# Patient Record
Sex: Female | Born: 1960 | Race: White | Hispanic: No | Marital: Single | Smoking: Former smoker
Health system: Southern US, Community
[De-identification: ages and names within clinical notes are randomized; demographics above are authoritative.]

## PROBLEM LIST (undated history)

## (undated) DIAGNOSIS — M779 Enthesopathy, unspecified: Secondary | ICD-10-CM

## (undated) DIAGNOSIS — R5383 Other fatigue: Secondary | ICD-10-CM

## (undated) DIAGNOSIS — J90 Pleural effusion, not elsewhere classified: Secondary | ICD-10-CM

## (undated) DIAGNOSIS — J9602 Acute respiratory failure with hypercapnia: Secondary | ICD-10-CM

## (undated) DIAGNOSIS — D126 Benign neoplasm of colon, unspecified: Secondary | ICD-10-CM

## (undated) DIAGNOSIS — R011 Cardiac murmur, unspecified: Secondary | ICD-10-CM

## (undated) DIAGNOSIS — K648 Other hemorrhoids: Secondary | ICD-10-CM

## (undated) DIAGNOSIS — R29898 Other symptoms and signs involving the musculoskeletal system: Secondary | ICD-10-CM

## (undated) DIAGNOSIS — I059 Rheumatic mitral valve disease, unspecified: Secondary | ICD-10-CM

## (undated) DIAGNOSIS — K589 Irritable bowel syndrome without diarrhea: Secondary | ICD-10-CM

## (undated) DIAGNOSIS — M791 Myalgia, unspecified site: Secondary | ICD-10-CM

## (undated) DIAGNOSIS — G44009 Cluster headache syndrome, unspecified, not intractable: Secondary | ICD-10-CM

## (undated) DIAGNOSIS — Z720 Tobacco use: Secondary | ICD-10-CM

## (undated) DIAGNOSIS — G4730 Sleep apnea, unspecified: Secondary | ICD-10-CM

## (undated) DIAGNOSIS — K5790 Diverticulosis of intestine, part unspecified, without perforation or abscess without bleeding: Secondary | ICD-10-CM

## (undated) DIAGNOSIS — Z953 Presence of xenogenic heart valve: Secondary | ICD-10-CM

## (undated) DIAGNOSIS — M1990 Unspecified osteoarthritis, unspecified site: Secondary | ICD-10-CM

## (undated) DIAGNOSIS — I5032 Chronic diastolic (congestive) heart failure: Secondary | ICD-10-CM

## (undated) DIAGNOSIS — M549 Dorsalgia, unspecified: Secondary | ICD-10-CM

## (undated) DIAGNOSIS — I340 Nonrheumatic mitral (valve) insufficiency: Secondary | ICD-10-CM

## (undated) DIAGNOSIS — E039 Hypothyroidism, unspecified: Secondary | ICD-10-CM

## (undated) DIAGNOSIS — J189 Pneumonia, unspecified organism: Secondary | ICD-10-CM

## (undated) DIAGNOSIS — K5900 Constipation, unspecified: Secondary | ICD-10-CM

## (undated) DIAGNOSIS — K219 Gastro-esophageal reflux disease without esophagitis: Secondary | ICD-10-CM

## (undated) DIAGNOSIS — K449 Diaphragmatic hernia without obstruction or gangrene: Secondary | ICD-10-CM

## (undated) DIAGNOSIS — J101 Influenza due to other identified influenza virus with other respiratory manifestations: Secondary | ICD-10-CM

## (undated) DIAGNOSIS — G894 Chronic pain syndrome: Secondary | ICD-10-CM

## (undated) DIAGNOSIS — R0600 Dyspnea, unspecified: Secondary | ICD-10-CM

## (undated) DIAGNOSIS — I5021 Acute systolic (congestive) heart failure: Secondary | ICD-10-CM

## (undated) DIAGNOSIS — I050 Rheumatic mitral stenosis: Secondary | ICD-10-CM

## (undated) DIAGNOSIS — M797 Fibromyalgia: Secondary | ICD-10-CM

## (undated) DIAGNOSIS — E43 Unspecified severe protein-calorie malnutrition: Secondary | ICD-10-CM

## (undated) DIAGNOSIS — Z86010 Personal history of colonic polyps: Secondary | ICD-10-CM

## (undated) DIAGNOSIS — J449 Chronic obstructive pulmonary disease, unspecified: Secondary | ICD-10-CM

## (undated) DIAGNOSIS — R61 Generalized hyperhidrosis: Secondary | ICD-10-CM

## (undated) HISTORY — DX: Myalgia, unspecified site: M79.10

## (undated) HISTORY — PX: CARDIAC CATHETERIZATION: SHX172

## (undated) HISTORY — DX: Other fatigue: R53.83

## (undated) HISTORY — PX: VULVA SURGERY: SHX837

## (undated) HISTORY — DX: Fibromyalgia: M79.7

## (undated) HISTORY — PX: THORACIC OUTLET SURGERY: SHX2502

## (undated) HISTORY — DX: Other hemorrhoids: K64.8

## (undated) HISTORY — PX: NEUROPLASTY / TRANSPOSITION MEDIAN NERVE AT CARPAL TUNNEL BILATERAL: SUR894

## (undated) HISTORY — DX: Gastro-esophageal reflux disease without esophagitis: K21.9

## (undated) HISTORY — DX: Cluster headache syndrome, unspecified, not intractable: G44.009

## (undated) HISTORY — DX: Rheumatic mitral valve disease, unspecified: I05.9

## (undated) HISTORY — PX: TUBAL LIGATION: SHX77

## (undated) HISTORY — PX: CYSTOSTOMY W/ BLADDER DILATION: SHX1432

## (undated) HISTORY — DX: Diaphragmatic hernia without obstruction or gangrene: K44.9

## (undated) HISTORY — DX: Benign neoplasm of colon, unspecified: D12.6

## (undated) HISTORY — DX: Unspecified osteoarthritis, unspecified site: M19.90

## (undated) HISTORY — DX: Enthesopathy, unspecified: M77.9

## (undated) HISTORY — DX: Irritable bowel syndrome, unspecified: K58.9

## (undated) HISTORY — DX: Generalized hyperhidrosis: R61

## (undated) HISTORY — DX: Diverticulosis of intestine, part unspecified, without perforation or abscess without bleeding: K57.90

## (undated) HISTORY — DX: Dorsalgia, unspecified: M54.9

---

## 1998-01-08 ENCOUNTER — Other Ambulatory Visit: Admission: RE | Admit: 1998-01-08 | Discharge: 1998-01-08 | Payer: Self-pay | Admitting: Obstetrics and Gynecology

## 1998-08-22 ENCOUNTER — Ambulatory Visit (HOSPITAL_COMMUNITY): Admission: RE | Admit: 1998-08-22 | Discharge: 1998-08-22 | Payer: Self-pay | Admitting: *Deleted

## 2000-05-14 ENCOUNTER — Other Ambulatory Visit: Admission: RE | Admit: 2000-05-14 | Discharge: 2000-05-14 | Payer: Self-pay | Admitting: *Deleted

## 2000-05-15 ENCOUNTER — Other Ambulatory Visit: Admission: RE | Admit: 2000-05-15 | Discharge: 2000-05-15 | Payer: Self-pay | Admitting: *Deleted

## 2000-06-18 ENCOUNTER — Encounter (INDEPENDENT_AMBULATORY_CARE_PROVIDER_SITE_OTHER): Payer: Self-pay

## 2000-06-18 ENCOUNTER — Other Ambulatory Visit: Admission: RE | Admit: 2000-06-18 | Discharge: 2000-06-18 | Payer: Self-pay | Admitting: *Deleted

## 2000-07-14 ENCOUNTER — Encounter: Admission: RE | Admit: 2000-07-14 | Discharge: 2000-07-14 | Payer: Self-pay | Admitting: *Deleted

## 2000-07-14 ENCOUNTER — Encounter: Payer: Self-pay | Admitting: Obstetrics and Gynecology

## 2000-10-02 ENCOUNTER — Ambulatory Visit (HOSPITAL_COMMUNITY): Admission: RE | Admit: 2000-10-02 | Discharge: 2000-10-02 | Payer: Self-pay | Admitting: Obstetrics and Gynecology

## 2000-10-02 ENCOUNTER — Encounter (INDEPENDENT_AMBULATORY_CARE_PROVIDER_SITE_OTHER): Payer: Self-pay | Admitting: Specialist

## 2002-02-09 ENCOUNTER — Encounter: Payer: Self-pay | Admitting: Emergency Medicine

## 2002-02-09 ENCOUNTER — Emergency Department (HOSPITAL_COMMUNITY): Admission: EM | Admit: 2002-02-09 | Discharge: 2002-02-09 | Payer: Self-pay | Admitting: Emergency Medicine

## 2002-03-01 ENCOUNTER — Other Ambulatory Visit: Admission: RE | Admit: 2002-03-01 | Discharge: 2002-03-01 | Payer: Self-pay | Admitting: Obstetrics and Gynecology

## 2003-08-29 ENCOUNTER — Emergency Department (HOSPITAL_COMMUNITY): Admission: EM | Admit: 2003-08-29 | Discharge: 2003-08-29 | Payer: Self-pay | Admitting: Emergency Medicine

## 2004-12-18 ENCOUNTER — Encounter: Admission: RE | Admit: 2004-12-18 | Discharge: 2004-12-18 | Payer: Self-pay | Admitting: Obstetrics and Gynecology

## 2007-02-10 ENCOUNTER — Ambulatory Visit (HOSPITAL_COMMUNITY): Admission: RE | Admit: 2007-02-10 | Discharge: 2007-02-10 | Payer: Self-pay | Admitting: Obstetrics

## 2007-02-10 ENCOUNTER — Encounter (INDEPENDENT_AMBULATORY_CARE_PROVIDER_SITE_OTHER): Payer: Self-pay | Admitting: Obstetrics

## 2008-10-17 ENCOUNTER — Encounter: Admission: RE | Admit: 2008-10-17 | Discharge: 2008-10-17 | Payer: Self-pay | Admitting: Obstetrics and Gynecology

## 2008-10-20 ENCOUNTER — Encounter: Admission: RE | Admit: 2008-10-20 | Discharge: 2008-10-20 | Payer: Self-pay | Admitting: Obstetrics and Gynecology

## 2010-05-30 ENCOUNTER — Other Ambulatory Visit: Payer: Self-pay | Admitting: Obstetrics & Gynecology

## 2010-07-02 ENCOUNTER — Ambulatory Visit
Admission: RE | Admit: 2010-07-02 | Discharge: 2010-07-02 | Disposition: A | Discharge status: Home / Self Care | Payer: Medicaid Other | Source: Ambulatory Visit | Attending: Family Medicine | Admitting: Family Medicine

## 2010-07-02 ENCOUNTER — Other Ambulatory Visit: Payer: Self-pay | Admitting: Family Medicine

## 2010-07-03 ENCOUNTER — Other Ambulatory Visit: Payer: Self-pay

## 2010-07-05 ENCOUNTER — Ambulatory Visit
Admission: RE | Admit: 2010-07-05 | Discharge: 2010-07-05 | Disposition: A | Discharge status: Home / Self Care | Payer: Medicaid Other | Source: Ambulatory Visit | Attending: Family Medicine | Admitting: Family Medicine

## 2010-09-10 NOTE — Op Note (Signed)
Elaine Le, Elaine Le           ACCOUNT NO.:  [id number]   MEDICAL RECORD NO.:  [id number]          PATIENT TYPE:  AMB   LOCATION:  SDC                           FACILITY:  [facility]   PHYSICIAN:  Sherry A. Dickstein, M.D.DATE OF BIRTH:  1961-01-15   DATE OF PROCEDURE:  02/10/2007  DATE OF DISCHARGE:                               OPERATIVE REPORT   PREOPERATIVE DIAGNOSES:  1. Desire for sterilization.  2. Right ovarian cyst.   POSTOPERATIVE DIAGNOSES:  1. Desire for sterilization.  2. Right ovarian cyst.  3. Resolved ovarian cyst.   PROCEDURE:  Diagnostic laparoscopy, laparoscopic tubal cautery.   SURGEONS:  Sherry A. Rosalio Macadamia, MD, and Alphonsus Sias. Ernestina Penna, MD.   ANESTHESIA:  General.   INDICATIONS:  This is a 50 year old, G2, P1-0-1-1 woman, who has been  seen in the office for irregular vaginal bleeding and intermittent  abdominal pain.  The patient was evaluated for this with an ultrasound.  Ultrasound revealed a right, complex, ovarian cyst, which was consistent  with a hemorrhagic cyst but felt that this needed to be evaluated.  The  patient also had calcifications in her left ovary approximately 2 mm x 3  mm.  The patient also requested a sterilization procedure.  Because of  the combination of these, this request and these findings, the patient  is brought to the operating room for a diagnostic laparoscopy,  laparoscopic tubal cautery, and possible ovarian cystectomy.   FINDINGS:  Normal size anteflexed uterus, normal fallopian tubes and  ovaries, no ovarian cyst seen.   PROCEDURE:  The patient was brought into the operating room.  She was  given adequate general anesthesia.  She was placed in the dorsal  lithotomy position.  Her abdomen and then her vagina were washed with  Betadine.  Pelvic examination was performed and no masses were palpated.  A speculum was placed within the vagina, and the cervix was grasped with  a single tooth Hulka tenaculum.  The speculum was  removed, the surgeon's  gown and gloves were changed, the patient was draped in a sterile  fashion.  A subumbilical area was infiltrated with 0.25% Marcaine,  incision was made and brought down sharply to the fascia.  The fascia  was grasped with Kocher clamps and was incised.  The fascial edges were  identified, and a pursestring stitch was taken with #0 Vicryl.  The  peritoneum was identified and elevated and incised.  An Hassan trocar  was introduced into the peritoneal cavity, carbon dioxide was used to  insufflate the peritoneum, the laparoscope was introduced, and the  pelvic organs were identified.  Pictures were obtained.  The entire  pelvis was inspected.  No endometriosis was seen in any of the areas in  the cul-de-sac anterior and posterior, the posterior ovarian fossae.  The entire upper abdomen was visualized, the appendix was felt to be  normal as well.  The right ovarian cyst that had been seen on ultrasound  was completely resolved.  The left ovary was visualized, there were some  small follicles present, no calcifications were identified, and it was  felt that the left  ovary should be left in place because the  calcifications could not be identified to be removed.  A suprapubic area  was infiltrated and then an incision was made, and under direct  visualization a suprapubic trocar was placed.  The left fallopian tube  was then cauterized in its isthmic ampullary portion over approximately  3 cm leaving at least 3 cm of normal fallopian tube between the  cauterized portion and the cornua.  The same procedure was performed on  the right.  Adequate hemostasis was present.  Prior to the major  inspection and prior to any surgery, the pelvis had been washed with  saline per pelvic washings.  After all the surgery, all carbon dioxide  was allowed to escape, the suprapubic trocar was removed, the Roseanne Reno was  removed, the fascia was closed with the Vicryl pursestring stitch over  a  finger to assure that no bowel was caught in this stitch.  The  subumbilical skin was closed using #4-0 Vicryl in a subcuticular running  stitch.  Both skin incisions were then closed using Dermabond.  The  Hulka tenaculum was removed from the vagina, the cervix was visualized  with no significant bleeding.  The patient was taken out of the dorsal  lithotomy position, she was awakened, she was extubated, she was moved  from the operating table to a stretcher in stable condition.   COMPLICATIONS:  None.   ESTIMATED BLOOD LOSS:  Less than 5 ml.      Sherry A. Rosalio Macadamia, M.D.  Electronically Signed     SAD/MEDQ  D:  02/10/2007  T:  02/10/2007  Job:  [id number]

## 2010-09-13 NOTE — Op Note (Signed)
[facility]  Patient:    Elaine Le, Elaine Le                    MRN: [id number] Proc. Date: 10/02/00 Adm. Date:  [id number] Attending:  Morene Antu                           Operative Report  PREOPERATIVE DIAGNOSES:       1. Irregular bleeding.                               2. Endometrial polyp.  POSTOPERATIVE DIAGNOSIS:      Endometrial polyp.  PROCEDURE:                    Dilation and curettage/hysteroscopy with                               resectoscope.  SURGEON:                      Sherry A. Rosalio Macadamia, M.D.  ANESTHESIA:                   MAC.  ESTIMATED BLOOD LOSS:         Less than 5 cc.  INDICATIONS:                  This is a 50 year old, G1, P1-0-0-1 woman who has had irregular bleeding. She was seen in the office. An ultrasound was performed showing a thickened endometrium with a probable endometrial polyp. Because of that, she is brought to the operating room for D&C/hysteroscopy with a resectoscope.  FINDINGS:                     Normal size anteflexed uterus. No adnexal mass. Endometrial polyp present.  DESCRIPTION OF PROCEDURE:     The patient was brought into the operating room given adequate IV sedation. She was placed in dorsal lithotomy position. Her perineum was washed with Betadine. Pelvic examination was performed. In-and-out catheterization was performed. Surgeons gown and gloves were changed. The patient was draped in sterile fashion. Speculum was placed within the vagina. The vagina was washed with Betadine. Paracervical block was administered with 1% Nesacaine. Anterior lip of the cervix was grasped with a single-tooth tenaculum. Cervix was sounded. Cervix was dilated with Pratt dilators to a #31. The resectoscope was introduced into the endometrial cavity. In placing the resectoscope, polypoid tissue was dislodged and the polyp was removed through the resectoscope. Once this was removed, the endometrial  cavity was able to be visualized. Pictures were obtained. Using a double loop right angle resector, sample tissue was taken circumferentially from the thickened endometrium. Adequate hemostasis was present. All instruments were removed from the vagina. The patient was taken out of the dorsal lithotomy position. She was awakened and she was moved from the operating table to a stretcher in stable condition. Complications were none. Estimated blood loss was less than 5 cc. Sorbitol differential -80 cc. DD:  10/02/00 TD:  10/02/00 Job: [id number]

## 2010-09-13 NOTE — Op Note (Signed)
[facility]  Patient:    Elaine Le, Elaine Le                    MRN: [id number] Proc. Date: 10/02/00 Adm. Date:  [id number] Attending:  Morene Antu                           Operative Report  PREOPERATIVE DIAGNOSIS:       Irregular bleeding and endometrial polyp.  POSTOPERATIVE DIAGNOSIS:      Endometrial polyp.  OPERATION:                    D&C, hysteroscopy with resectoscope.  SURGEON:                      Sherry A. Rosalio Macadamia, M.D.  ANESTHESIA:                   MAC.  INDICATIONS:                  This is a 49 year old gravida 1, para 1-0-0-1 woman, who has had irregular bleeding.  She was seen in the office. An ultrasound was performed showing a thickened endometrium with a probable endometrial polyp.  Because of that, she is brought to the operating room for [facility] hysteroscopy with a resectoscope.  FINDINGS:                     Normal size anteflexed uterus, no adnexal mass. Endometrial polyp present.  DESCRIPTION OF PROCEDURE:     The patient was brought into the operating room and given adequate IV sedation.  She was placed in the dorsolithotomy position.  Her perineum was washed with Betadine.  Pelvic examination was performed.  An in and out catheterization of the bladder was performed.  The surgeons gowns and gloves were changed.  The patient was draped in a sterile fashion.  A speculum was placed within the vagina.  The vagina was washed with Betadine.  A paracervical block was administered with 1% Nesacaine.  The anterior lip of the cervix was grasped with a single tooth tenaculum.  The cervix was sounded.  The cervix was dilated with Pratt dilators to a #31.  The resectoscope was introduced into the endometrial cavity and placed via the resectoscope.  Polypoid tissue was dislodged and the polyp was removed through the resectoscope.  Once this was removed, the endometrial cavity was able to be visualized. Pictures were  obtained using a double loop right angle resector.  A sample of tissue was taken circumferentially from the thickened endometrium.  Adequate hemostasis was present.  All instruments were removed from the vagina.  The patient was taken out of the dorsolithotomy position.  She was awakened. She was moved from the operating table to a stretcher in stable condition. Complications were none.  Estimated blood loss was less than 5 cc.  Sorbitol differential -80 cc. DD:  10/02/00 TD:  10/03/00 Job: [id number]

## 2010-12-31 ENCOUNTER — Other Ambulatory Visit: Payer: Self-pay | Admitting: Family Medicine

## 2010-12-31 ENCOUNTER — Encounter: Payer: Self-pay | Admitting: Internal Medicine

## 2011-01-08 ENCOUNTER — Other Ambulatory Visit: Payer: Self-pay | Admitting: Family Medicine

## 2011-01-21 ENCOUNTER — Other Ambulatory Visit (INDEPENDENT_AMBULATORY_CARE_PROVIDER_SITE_OTHER): Payer: Medicare Other

## 2011-01-21 ENCOUNTER — Encounter: Payer: Self-pay | Admitting: Internal Medicine

## 2011-01-21 ENCOUNTER — Ambulatory Visit (INDEPENDENT_AMBULATORY_CARE_PROVIDER_SITE_OTHER): Payer: Medicare Other | Admitting: Internal Medicine

## 2011-01-21 DIAGNOSIS — R6889 Other general symptoms and signs: Secondary | ICD-10-CM

## 2011-01-21 DIAGNOSIS — R1013 Epigastric pain: Secondary | ICD-10-CM

## 2011-01-21 DIAGNOSIS — M797 Fibromyalgia: Secondary | ICD-10-CM | POA: Insufficient documentation

## 2011-01-21 DIAGNOSIS — K589 Irritable bowel syndrome without diarrhea: Secondary | ICD-10-CM | POA: Insufficient documentation

## 2011-01-21 DIAGNOSIS — Z1211 Encounter for screening for malignant neoplasm of colon: Secondary | ICD-10-CM

## 2011-01-21 DIAGNOSIS — K219 Gastro-esophageal reflux disease without esophagitis: Secondary | ICD-10-CM

## 2011-01-21 HISTORY — DX: Irritable bowel syndrome without diarrhea: K58.9

## 2011-01-21 LAB — COMPREHENSIVE METABOLIC PANEL
AST: 26 U/L (ref 0–37)
Alkaline Phosphatase: 94 U/L (ref 39–117)
BUN: 10 mg/dL (ref 6–23)
Creatinine, Ser: 0.6 mg/dL (ref 0.4–1.2)
Glucose, Bld: 105 mg/dL — ABNORMAL HIGH (ref 70–99)
Potassium: 4.8 mEq/L (ref 3.5–5.1)
Total Bilirubin: 0.5 mg/dL (ref 0.3–1.2)

## 2011-01-21 LAB — CBC WITH DIFFERENTIAL/PLATELET
Basophils Relative: 0.7 % (ref 0.0–3.0)
Eosinophils Absolute: 0.1 10*3/uL (ref 0.0–0.7)
HCT: 44.7 % (ref 36.0–46.0)
Lymphs Abs: 1.7 10*3/uL (ref 0.7–4.0)
MCHC: 33.9 g/dL (ref 30.0–36.0)
MCV: 93 fl (ref 78.0–100.0)
Monocytes Absolute: 0.5 10*3/uL (ref 0.1–1.0)
Neutrophils Relative %: 74.1 % (ref 43.0–77.0)
RBC: 4.81 Mil/uL (ref 3.87–5.11)

## 2011-01-21 LAB — TSH: TSH: 3.75 u[IU]/mL (ref 0.35–5.50)

## 2011-01-21 MED ORDER — ALIGN PO CAPS: 1.0000 | ORAL_CAPSULE | Freq: Every day | ORAL | Status: DC

## 2011-01-21 MED ORDER — PEG-KCL-NACL-NASULF-NA ASC-C 100 G PO SOLR: 1.0000 | Freq: Once | ORAL | Status: DC

## 2011-01-21 MED ORDER — PANTOPRAZOLE SODIUM 40 MG PO TBEC: 40.0000 mg | DELAYED_RELEASE_TABLET | Freq: Every day | ORAL | Status: DC

## 2011-01-21 NOTE — Progress Notes (Signed)
Subjective:    Patient ID: Elaine Le, female    DOB: 05-Nov-1960, 50 y.o.   MRN: [id number]  HPI Elaine Le is a 50 year old female with a past medical history of IBS, fibromyalgia, GERD, arthritis seen in consultation at the request of Dr. Docia Chuck for evaluation of reflux and abdominal pain with bloating.  The patient reports she's had "stomach problems" since childhood. She reports this has been worse over the last 10 years. She reports one of her issues is acid reflux. She notes this to be worse at night.  Her main symptoms are heartburn with water brash. She occasionally regurgitates food. She denies dysphagia or odynophagia.  She has taken Nexium in the past but has been off this for the last 1-2 months. She reports Nexium helps her but not completely. She has not taken it because she wanted a GI opinion. She also reports burning epigastric pain which is intermittent, and often briefly better with eating. She reports feeling "hungry, but not hungry". She reports her appetite overall is good, she tries to eat twice daily. She reports her weight is stable to slightly increased. She does note some nausea without vomiting. Regarding her bowel movements, she reports that she alternates between diarrhea and constipation. She has used MiraLAX 17 g daily. When she is on daily MiraLAX she reports her stools are too loose, and like diarrhea. When she is off MiraLAX she reports constipation. She does occasionally use when necessary lactulose for severe constipation. She denies red blood in her stools but reports occasional dark stool. No definite melena.  She notes significant gas with bloating and belching. This is a long-standing symptom. She also will have lower abdominal cramping prior bowel movement. She denies fevers chills or night sweats. She does rarely use Vicodin for fibromyalgia. She reports her sleep is poor due to reflux and hot flashes.  She also remembers an upper endoscopy and  colonoscopy versus flexible sigmoidoscopy greater than 10 years ago. She also endorses a history of H. pylori, unsure how this was diagnosed. She does remember completing antibiotic therapy. This was also greater than 10 years ago   Review of Systems Constitutional: Negative for fever, chills, night sweats, activity change, appetite change and unexpected weight change HEENT: Negative for sore throat, mouth sores and trouble swallowing.  +Allergy/sinus trouble. Eyes: Negative for visual disturbance Respiratory: Negative for cough, chest tightness and shortness of breath Cardiovascular: Negative for chest pain, palpitations and lower extremity swelling Gastrointestinal: See history of present illness Genitourinary: Negative for dysuria and hematuria. Positive for chronic urinary leakage Musculoskeletal: No arthralgias or myalgias, positive chronic back pain Skin: Negative for rash or color change Neurological: Negative for weakness, numbness, positive for intermittent headaches Hematological: Negative for adenopathy, negative for easy bruising/bleeding Psychiatric/behavioral: Negative for depressed mood, negative for anxiety   Past Medical History  Diagnosis Date  . Chronic sinusitis   . Arthritis   . Fibromyalgia   . GERD (gastroesophageal reflux disease)   . Irritable bowel syndrome (IBS)   . Headaches, cluster   . Back pain   . Fatigue   . Muscle pain   . Night sweats   . Continuous urine leakage    Current Outpatient Prescriptions  Medication Sig Dispense Refill  . Black Cohosh 540 MG CAPS Take by mouth. Prn       . esomeprazole (NEXIUM) 40 MG capsule Take 40 mg by mouth daily before breakfast.        . HYDROcodone-acetaminophen (VICODIN) 5-500 MG per  tablet Take 1 tablet by mouth every 6 (six) hours as needed.        Marland Kitchen ibuprofen (ADVIL,MOTRIN) 200 MG tablet Take 200 mg by mouth every 6 (six) hours as needed.        Boris Lown Oil 500 MG CAPS Take by mouth. As directed           . lactulose (CHRONULAC) 10 GM/15ML solution Take 20 g by mouth daily as needed.        . magnesium gluconate (MAGONATE) 500 MG tablet Take 1,000 mg by mouth daily.        . polyethylene glycol (MIRALAX / GLYCOLAX) packet Take 17 g by mouth daily.        . pseudoephedrine (SUDAFED) 30 MG tablet Take 30 mg by mouth every 4 (four) hours as needed.        . psyllium (REGULOID) 0.52 G capsule Take by mouth daily. 5 capsules as needed       . sodium chloride (OCEAN) 0.65 % nasal spray Place 2 sprays into the nose as needed.        . bifidobacterium infantis (ALIGN) capsule Take 1 capsule by mouth daily.  30 capsule  0  . pantoprazole (PROTONIX) 40 MG tablet Take 1 tablet (40 mg total) by mouth daily.  30 tablet  11  . peg 3350 powder (MOVIPREP) 100 G SOLR Take 1 kit (100 g total) by mouth once.  1 kit  0   Allergies  Allergen Reactions  . Morphine And Related     Altered mental state   Family History  Problem Relation Age of Onset  . Kidney cancer Mother   . Colon polyps Mother   . Diabetes Sister     brother  . Liver disease Sister -- fatty liver   . Irritable bowel syndrome Mother     daughter   Social History  . Marital Status: Single    Number of Children: 1   Social History Main Topics  . Smoking status: Current Everyday Smoker -- 0.5 packs/day    Types: Cigarettes  . Smokeless tobacco: Never Used  . Alcohol Use: No  . Drug Use: No      Objective:   Physical Exam BP 102/70  Pulse 78  Ht 5' (1.524 m)  Wt 109 lb (49.442 kg)  BMI 21.29 kg/m2  SpO2 96% Constitutional: Well-developed and well-nourished. No distress. HEENT: Normocephalic and atraumatic. Oropharynx is clear and moist. No oropharyngeal exudate. Conjunctivae are normal. Pupils are equal round and reactive to light. No scleral icterus. Neck: Neck supple. Trachea midline. Cardiovascular: Normal rate, regular rhythm and intact distal pulses. No M/R/G Pulmonary/chest: Effort normal and breath sounds normal. No  wheezing, rales or rhonchi. Abdominal: Soft, nontender, mildly distended. Bowel sounds active throughout. There are no masses palpable. No hepatosplenomegaly. Lymphadenopathy: No cervical adenopathy noted. Neurological: Alert and oriented to person place and time. Skin: Skin is warm and dry. No rashes noted. Psychiatric: Normal mood and affect. Behavior is normal.  Abdominal ultrasound March 2012: COMPLETE ABDOMINAL ULTRASOUND  Comparison: None.  Findings:  Gallbladder: No gallstones, gallbladder wall thickening, or  pericholecystic fluid. Evaluation for sonographic Murphy's sign was  negative  Common bile duct: Has a maximal caliber 3.4 mm and a normal  appearance  Liver: No focal lesion identified. Within normal limits in  parenchymal echogenicity.  IVC: Appears normal.  Pancreas: No focal abnormality seen.  Spleen: Has a sagittal length of 2.9 cm. No focal parenchymal  abnormalities are  seen  Right Kidney: Has a sagittal length of 10.1 cm. No focal  parenchymal abnormality or signs of hydronephrosis are evident  Left Kidney: Demonstrates a sagittal length of 10.9 cm. No focal  parenchymal abnormality or signs of hydronephrosis are evident.  Abdominal aorta: Has a maximal caliber of 1.8 cm with no  aneurysmal dilatation.   IMPRESSION:  Negative abdominal ultrasound.     Assessment & Plan:  50 year old female with a past medical history of IBS, fibromyalgia, GERD, arthritis seen in consultation at the request of Dr. Docia Chuck for evaluation of reflux and abdominal pain with bloating  1.  IBS -- the vast majority of the patient's symptoms are very consistent with irritable bowel syndrome. Majority of her symptoms are long-standing and there is no specific alarm symptom at present.  We have extensively discussed irritable bowel today and how it is chronic in nature and seems to flare based on changing conditions in her life, such as stress. I would like to perform several lab tests  today including CBC, chemistry panel, TSH and celiac panel. We have discussed gas and bloating I have recommended that she avoid specific foods like for citrus vegetables, legumes, and carbonated beverages. We will give her a low gas/low bloating diet. I also would like for her to take MiraLAX 3 days per week in an attempt to better regulate her bowel habits and avoid constipation. I will start her on Align one tablet daily. She was given a 14 day sample with some coupons for this today. Hopefully this will help regulate her digestive system and improve her regularity.  We also briefly discussed on narcotic pain medication can affect bowel transit and in some cases worsen abdominal pain. At present she is rarely using this medication and for now I doesn't seem to be a major contributing factor to her symptoms. Finally she is due colonoscopy for screening and we'll schedule this today.  2. GERD -- she does have symptoms consistent with reflux disease. This is a daily problem at present. She is unsure of Nexium completely helps her, therefore we'll try another PPI. I've given her prescription for pantoprazole 40 mg daily. We did discuss how best to take this medication, specifically 30 minutes to one hour before her first meal of the day. Also given her epigastric burning and the long-standing nature of her heartburn, we will proceed with upper endoscopy. This will be done on the same day as her colonoscopy.  All procedures will be done with propofol given her intermittent narcotic pain medication use.  Return to clinic one month after procedures.

## 2011-01-21 NOTE — Patient Instructions (Addendum)
Go directly to the basement to have your labs drawn today.  You have been given a separate informational sheet regarding your tobacco use, the importance of quitting and local resources to help you quit. Discontinue Nexium and start Protonix one tablet by mouth once daily. A prescription has been sent to the pharmacy.  Low gas diet given. Use Miralax over the counter 17 grams in 8 oz of water on Monday, Wednesday, and Friday.  Start Align one tablet by mouth once daily and samples given to get you started.  You have been scheduled for a Endoscopy/ Colonoscopy with propofol. See separate instructions. Follow with Dr. Rhea Belton one month after your procedure.  cc: [place] Docia Chuck, MD

## 2011-01-22 LAB — CELIAC PANEL 10
Endomysial Screen: NEGATIVE
Gliadin IgA: 3 U/mL (ref <20)
Gliadin IgG: 3.3 U/mL (ref <20)
IgA: 132 mg/dL (ref 69–380)
Tissue Transglutaminase Ab, IgA: 3.5 U/mL (ref <20)

## 2011-01-31 ENCOUNTER — Other Ambulatory Visit: Payer: Self-pay | Admitting: Family Medicine

## 2011-01-31 ENCOUNTER — Ambulatory Visit
Admission: RE | Admit: 2011-01-31 | Discharge: 2011-01-31 | Disposition: A | Discharge status: Home / Self Care | Payer: Medicare Other | Source: Ambulatory Visit | Attending: Family Medicine | Admitting: Family Medicine

## 2011-01-31 DIAGNOSIS — M25562 Pain in left knee: Secondary | ICD-10-CM

## 2011-02-04 ENCOUNTER — Other Ambulatory Visit: Payer: Self-pay | Admitting: Family Medicine

## 2011-02-04 DIAGNOSIS — R9089 Other abnormal findings on diagnostic imaging of central nervous system: Secondary | ICD-10-CM

## 2011-02-05 LAB — CBC
MCHC: 34.4
MCV: 92.4
RDW: 13.8

## 2011-02-05 LAB — URINALYSIS, ROUTINE W REFLEX MICROSCOPIC
Ketones, ur: NEGATIVE
Protein, ur: NEGATIVE
Urobilinogen, UA: 0.2

## 2011-02-05 LAB — PREGNANCY, URINE: Preg Test, Ur: NEGATIVE

## 2011-02-10 ENCOUNTER — Ambulatory Visit
Admission: RE | Admit: 2011-02-10 | Discharge: 2011-02-10 | Disposition: A | Discharge status: Home / Self Care | Payer: Medicare Other | Source: Ambulatory Visit | Attending: Family Medicine | Admitting: Family Medicine

## 2011-02-10 DIAGNOSIS — R9089 Other abnormal findings on diagnostic imaging of central nervous system: Secondary | ICD-10-CM

## 2011-02-13 ENCOUNTER — Encounter: Payer: Self-pay | Admitting: Internal Medicine

## 2011-02-13 ENCOUNTER — Ambulatory Visit (AMBULATORY_SURGERY_CENTER): Payer: Medicare Other | Admitting: Internal Medicine

## 2011-02-13 DIAGNOSIS — Z1211 Encounter for screening for malignant neoplasm of colon: Secondary | ICD-10-CM

## 2011-02-13 DIAGNOSIS — K2970 Gastritis, unspecified, without bleeding: Secondary | ICD-10-CM

## 2011-02-13 DIAGNOSIS — K2940 Chronic atrophic gastritis without bleeding: Secondary | ICD-10-CM

## 2011-02-13 DIAGNOSIS — R1013 Epigastric pain: Secondary | ICD-10-CM

## 2011-02-13 DIAGNOSIS — D126 Benign neoplasm of colon, unspecified: Secondary | ICD-10-CM

## 2011-02-13 DIAGNOSIS — K219 Gastro-esophageal reflux disease without esophagitis: Secondary | ICD-10-CM

## 2011-02-13 MED ORDER — SODIUM CHLORIDE 0.9 % IV SOLN: 500.0000 mL | INTRAVENOUS | Status: DC

## 2011-02-13 NOTE — Patient Instructions (Signed)
FOLLOW DISCHARGE INSTRUCTIONS (BLUE & GREEN SHEETS).   INFORMATION ON POLYPS, DIVERTICULOSIS, HIGH FIBER DIET , HIATAL HERNIA, & GASTRITIS GIVEN TO YOU  PLEASE MAKE APPOINTMENT TO FOLLOW UP WITH DR PYRTLE IN 1-2 MONTHS.

## 2011-02-14 ENCOUNTER — Telehealth: Payer: Self-pay | Admitting: *Deleted

## 2011-02-14 ENCOUNTER — Telehealth: Payer: Self-pay

## 2011-02-14 NOTE — Telephone Encounter (Signed)
No ID on answering machine. 

## 2011-02-14 NOTE — Telephone Encounter (Signed)
Patient called around 8:30am to ask if she could take advil.   I did answer her about 8:40am although I am just charting the conversation.  She thanked me, and she praised our center.

## 2011-02-14 NOTE — Telephone Encounter (Signed)
Per EGD notes pt needs a f/u in 1-2 months; scheduled for 03/10/11 at 11:15am; pt stated understanding.

## 2011-02-20 ENCOUNTER — Encounter: Payer: Self-pay | Admitting: Internal Medicine

## 2011-03-10 ENCOUNTER — Ambulatory Visit (INDEPENDENT_AMBULATORY_CARE_PROVIDER_SITE_OTHER): Payer: Medicare Other | Admitting: Internal Medicine

## 2011-03-10 ENCOUNTER — Encounter: Payer: Self-pay | Admitting: Internal Medicine

## 2011-03-10 VITALS — BP 86/64 | HR 72 | Ht 60.0 in | Wt 108.4 lb

## 2011-03-10 DIAGNOSIS — Z86010 Personal history of colonic polyps: Secondary | ICD-10-CM | POA: Insufficient documentation

## 2011-03-10 DIAGNOSIS — Z860101 Personal history of adenomatous and serrated colon polyps: Secondary | ICD-10-CM

## 2011-03-10 DIAGNOSIS — K589 Irritable bowel syndrome without diarrhea: Secondary | ICD-10-CM

## 2011-03-10 DIAGNOSIS — D126 Benign neoplasm of colon, unspecified: Secondary | ICD-10-CM

## 2011-03-10 DIAGNOSIS — K219 Gastro-esophageal reflux disease without esophagitis: Secondary | ICD-10-CM

## 2011-03-10 HISTORY — DX: Personal history of adenomatous and serrated colon polyps: Z86.0101

## 2011-03-10 HISTORY — DX: Personal history of colonic polyps: Z86.010

## 2011-03-10 NOTE — Progress Notes (Signed)
Subjective:    Patient ID: Elaine Le, female    DOB: [birth year], 50 y.o.   MRN: [id number]  HPI 50 yo female with PMH of IBS, fibromyalgia, GERD, arthritis who is seen in follow-up.  She underwent EGD/colon on 02/13/2011. The upper endoscopy revealed a hiatal hernia and mild to moderate antral gastritis. Biopsies were negative for H. pylori infection. The colonoscopy with a fair prep to the cecum revealed a 4 mm tubular adenoma in the sigmoid colon which was removed. Today she reports that overall she is doing well, but she still has issues with heartburn and abdominal bloating. She reports that her heartburn is "about the same". She is not using the pantoprazole daily, but rather using it more as needed. She is using ranitidine 300 mg each bedtime on most nights. She also reports abdominal bloating which is worse when she overeats. She reports when she eats small meals the bloating is less of a problem. She denies specific abdominal pain. She has no nausea or vomiting. No trouble swallowing or painful swallowing. Her bowel movements continue to alternate between diarrhea and constipation, but she feels constipation is more the norm for her. She is using MiraLAX, however not daily. She uses this about 2 times per week. This is usually enough to avoid severe constipation. On one occasion recently, she did use lactulose for constipation. She denies rectal bleeding and melena. No fevers chills or night sweats. Appetite and weight are stable. At her last visit, she was given samples of Align, but she admits to never having started this medication.  Review of Systems Constitutional: Negative for fever, chills, night sweats, activity change, appetite change and unexpected weight change HEENT: Negative for sore throat, mouth sores and trouble swallowing. Eyes: Negative for visual disturbance Respiratory: Negative for cough, chest tightness and shortness of breath Cardiovascular: Negative for chest  pain, palpitations and lower extremity swelling Gastrointestinal: See history of present illness Genitourinary: Negative for dysuria and hematuria. Musculoskeletal: Negative for back pain, arthralgias and myalgias Skin: Negative for rash or color change Neurological: Negative for headaches, weakness, numbness Hematological: Negative for adenopathy, negative for easy bruising/bleeding Psychiatric/behavioral: Negative for depressed mood, negative for anxiety  Patient Active Problem List  Diagnoses  . IBS (irritable bowel syndrome)  . GERD (gastroesophageal reflux disease)  . Fibromyalgia   Current Outpatient Prescriptions  Medication Sig Dispense Refill  . Black Cohosh 540 MG CAPS Take by mouth. Prn       . HYDROcodone-acetaminophen (VICODIN) 5-500 MG per tablet Take 1 tablet by mouth every 6 (six) hours as needed.        Marland Kitchen ibuprofen (ADVIL,MOTRIN) 200 MG tablet Take 200 mg by mouth every 6 (six) hours as needed.        . lactulose (CHRONULAC) 10 GM/15ML solution Take 20 g by mouth daily as needed.        . magnesium gluconate (MAGONATE) 500 MG tablet Take 1,000 mg by mouth daily.        . pantoprazole (PROTONIX) 40 MG tablet Take 40 mg by mouth as needed.        . polyethylene glycol (MIRALAX / GLYCOLAX) packet Take 17 g by mouth 2 (two) times a week.       . pseudoephedrine (SUDAFED) 30 MG tablet Take 30 mg by mouth every 4 (four) hours as needed.        . ranitidine (ZANTAC) 150 MG tablet Take 150 mg by mouth daily.        Marland Kitchen  sodium chloride (OCEAN) 0.65 % nasal spray Place 2 sprays into the nose as needed.        . bifidobacterium infantis (ALIGN) capsule Take 1 capsule by mouth daily.  30 capsule  0  . Krill Oil 500 MG CAPS Take by mouth. As directed          Allergies  Allergen Reactions  . Morphine And Related     Altered mental state   Family History  Problem Relation Age of Onset  . Kidney cancer Mother   . Colon polyps Mother   . Irritable bowel syndrome Mother   .  Diabetes Sister   . Liver disease Sister   . Irritable bowel syndrome Daughter   . Diabetes Brother   . Colon cancer Neg Hx    Social History  . Marital Status: Single    Number of Children: 1   Social History Main Topics  . Smoking status: Current Everyday Smoker -- 0.5 packs/day    Types: Cigarettes  . Smokeless tobacco: Never Used  . Alcohol Use: Yes     rare  . Drug Use: No     Objective:   Physical Exam BP 86/64  Pulse 72  Ht 5' (1.524 m)  Wt 108 lb 6.4 oz (49.17 kg)  BMI 21.17 kg/m2 Constitutional: Well-developed and well-nourished. No distress. HEENT: Normocephalic and atraumatic. Oropharynx is clear and moist. No oropharyngeal exudate. Conjunctivae are normal. Pupils are equal round and reactive to light. No scleral icterus. Neck: Neck supple. Trachea midline. Cardiovascular: Normal rate, regular rhythm and intact distal pulses. No M/R/G Pulmonary/chest: Effort normal and breath sounds normal. No wheezing, rales or rhonchi. Abdominal: Soft, nontender, nondistended. Bowel sounds active throughout. There are no masses palpable. No hepatosplenomegaly. Extremities: no clubbing, cyanosis, or edema Lymphadenopathy: No cervical adenopathy noted. Neurological: Alert and oriented to person place and time. Skin: Skin is warm and dry. No rashes noted. Psychiatric: Normal mood and affect. Behavior is normal.  CBC    Component Value Date/Time   WBC 9.3 01/21/2011 0947   RBC 4.81 01/21/2011 0947   HGB 15.1* 01/21/2011 0947   HCT 44.7 01/21/2011 0947   PLT 363.0 01/21/2011 0947   MCV 93.0 01/21/2011 0947   MCHC 33.9 01/21/2011 0947   RDW 13.4 01/21/2011 0947   LYMPHSABS 1.7 01/21/2011 0947   MONOABS 0.5 01/21/2011 0947   EOSABS 0.1 01/21/2011 0947   BASOSABS 0.1 01/21/2011 0947   CMP     Component Value Date/Time   NA 141 01/21/2011 0947   K 4.8 01/21/2011 0947   CL 106 01/21/2011 0947   CO2 28 01/21/2011 0947   GLUCOSE 105* 01/21/2011 0947   BUN 10 01/21/2011 0947   CREATININE  0.6 01/21/2011 0947   CALCIUM 9.4 01/21/2011 0947   PROT 7.1 01/21/2011 0947   ALBUMIN 4.3 01/21/2011 0947   AST 26 01/21/2011 0947   ALT 26 01/21/2011 0947   ALKPHOS 94 01/21/2011 0947   BILITOT 0.5 01/21/2011 0947    Celiac panel - negative TSH - normal     Assessment & Plan:  50 yo female with PMH of IBS, fibromyalgia, GERD, arthritis who is seen in follow-up  1. GERD -- the patient is still having issues with heartburn on occasion, but this seems to be much that are controlled when she modifies her diet, specifically avoiding overeating and eating late at night.  We have discussed that she can continue to use ranitidine on an as-needed basis, and this medication is ideal  for intermittent or rare heartburn. We also discussed that should she have symptoms that bother her daily or near daily, then PPI therapy is likely better for her. We have discussed that as needed use of pantoprazole will likely not result in adequate symptom control just by nature of the drugs pharmacology.  I've advised that should she feel she needs daily control of her heartburn symptoms, she should take pantoprazole 40 mg daily. If not and she only needs intermittent control/symptom relief, then when necessary ranitidine is sufficient.  We discussed GERD hygiene.  2. IBS/bloating -- the patient has symptoms that are classic for irritable bowel syndrome. These are long-standing for her. We have again discussed a low bloating diet.  Also she can continue to titrate MiraLAX 17 g daily as needed to avoid constipation. I have again recommended Align one capsule daily, and she is willing to give this a try. This may help both with bowel irregularity and bloating. Small bowel retrorectus also in the differential for her bloating, and should her symptoms persist after the aforementioned changes, then we could consider a trial of empiric antibiotic therapy.  3. Adenomatous colon polyp -- the patient is due repeat colorectal cancer  screening/surveillance in October 2017.  Return in 3 months or sooner as needed.

## 2011-03-10 NOTE — Patient Instructions (Addendum)
You have been given a separate informational sheet regarding your tobacco use, the importance of quitting and local resources to help you quit. Take the Ranitidine ( Zantac) as needed.   Continue the Protonix daily. Start the [employer], (the probiotic)  1 capsule daily. Continue the Miralax, samples and coupons provided. Follow up with Dr. Rhea Belton in 3 months.

## 2011-07-21 ENCOUNTER — Encounter: Payer: Self-pay | Admitting: Internal Medicine

## 2011-07-21 ENCOUNTER — Ambulatory Visit (INDEPENDENT_AMBULATORY_CARE_PROVIDER_SITE_OTHER): Payer: Medicare Other | Admitting: Internal Medicine

## 2011-07-21 VITALS — BP 86/60 | HR 80 | Ht 60.0 in | Wt 110.1 lb

## 2011-07-21 DIAGNOSIS — Z86010 Personal history of colonic polyps: Secondary | ICD-10-CM

## 2011-07-21 DIAGNOSIS — R1013 Epigastric pain: Secondary | ICD-10-CM

## 2011-07-21 DIAGNOSIS — K219 Gastro-esophageal reflux disease without esophagitis: Secondary | ICD-10-CM

## 2011-07-21 DIAGNOSIS — R1031 Right lower quadrant pain: Secondary | ICD-10-CM

## 2011-07-21 DIAGNOSIS — K589 Irritable bowel syndrome without diarrhea: Secondary | ICD-10-CM

## 2011-07-21 MED ORDER — ALIGN PO CAPS: 1.0000 | ORAL_CAPSULE | Freq: Every day | ORAL | Status: AC

## 2011-07-21 MED ORDER — RANITIDINE HCL 150 MG PO TABS: 150.0000 mg | ORAL_TABLET | Freq: Every day | ORAL | Status: DC

## 2011-07-21 MED ORDER — POLYETHYLENE GLYCOL 3350 17 G PO PACK: 17.0000 g | PACK | ORAL | Status: DC

## 2011-07-21 NOTE — Patient Instructions (Addendum)
You have been given a separate informational sheet regarding your tobacco use, the importance of quitting and local resources to help you quit.  You have been scheduled for a CT scan of the abdomen and pelvis at [facility] ([address]---this is in the same building as [facility]).   You are scheduled on 07/23/2011 at 9:00am. You should arrive 15 minutes prior to your appointment time for registration. Please follow the written instructions below on the day of your exam:  WARNING: IF YOU ARE ALLERGIC TO IODINE/X-RAY DYE, PLEASE NOTIFY RADIOLOGY IMMEDIATELY AT [phone]! YOU WILL BE GIVEN A 13 HOUR PREMEDICATION PREP.  1) Do not eat or drink anything after 5:00am (4 hours prior to your test) 2) You have been given 2 bottles of oral contrast to drink. The solution may taste better if refrigerated, but do NOT add ice or any other liquid to this solution. Shake             well before drinking.    Drink 1 bottle of contrast @ 7:00 (2 hours prior to your exam)  Drink 1 bottle of contrast @ 8:00 (1 hour prior to your exam)  You may take any medications as prescribed with a small amount of water except for the following: Metformin, Glucophage, Glucovance, Avandamet, Riomet, Fortamet, Actoplus Met, Janumet, Glumetza or Metaglip. The above medications must be held the day of the exam AND 48 hours after the exam.  The purpose of you drinking the oral contrast is to aid in the visualization of your intestinal tract. The contrast solution may cause some diarrhea. Before your exam is started, you will be given a small amount of fluid to drink. Depending on your individual set of symptoms, you may also receive an intravenous injection of x-ray contrast/dye. Plan on being at [facility] for 30 minutes or long, depending on the type of exam you are having performed.  If you have any questions regarding your exam or if you need to reschedule, you may call the CT department at  [phone] between the hours of 8:00 am and 5:00 pm, Monday-Friday.  We have sent the following medications to your pharmacy for you to pick up at your convenience: Align, Mirilax and Zantac, please take as directed.  Dr. Rhea Belton would like you to follow up with him in 3 months    ________________________________________________________________________

## 2011-07-21 NOTE — Progress Notes (Signed)
Subjective:    Patient ID: Elaine Le, female    DOB: 29-Aug-1960, 51 y.o.   MRN: [id number]  HPI 51 yo female with PMH of IBS, fibromyalgia, GERD, arthritis, and adenomatous colon polyp who is seen in follow-up. She underwent EGD/colon on 02/13/2011. The upper endoscopy revealed a hiatal hernia and mild to moderate H. pylori negative antral gastritis.  The colonoscopy with a fair prep to the cecum revealed a 4 mm tubular adenoma in the sigmoid colon which was removed. Today she reports she continues to have intermittent issues with abdominal bloating and occasional heartburn. She was previously taking PPI therapy, but preferred less complete acid suppression, because she was concerned about malnutrition and trouble with absorption with PPI therapy. She is taking Zantac 150 mg each bedtime, and she thinks this is controlling her heartburn/GERD symptoms adequately at present. She did try Align and she feels this has helped with her bloating, and she desires to continue this therapy. She does report intermittent right lower quadrant abdominal pain. This pain can be severe, and does fluctuate. It is not present daily, but present frequently enough to concern her. Her bowel habits continue to fluctuate and she had tapered off MiraLAX, but restarted it after developing heart stool/constipation. He is taking MiraLAX 17 g daily at present. She also recently started an over-the-counter pancreatic enzyme supplement, and she feels this has helped with her bloating as well as her bowel habits. She denies blood in her stool and she denies melena. No fevers or chills. No trouble swallowing. No rash. No edema.  Review of Systems As per history of present illness, otherwise negative  Current Medications, Allergies, Past Medical History, Past Surgical History, Family History and Social History were reviewed in [employer] record.     Objective:   Physical Exam BP 86/60  Pulse 80  Ht  5' (1.524 m)  Wt 110 lb 2 oz (49.952 kg)  BMI 21.51 kg/m2 Constitutional: Well-developed and well-nourished. No distress. HEENT: Normocephalic and atraumatic. Oropharynx is clear and moist. No oropharyngeal exudate. Conjunctivae are normal. Pupils are equal round and reactive to light. No scleral icterus. Neck: Neck supple. Trachea midline. Cardiovascular: Normal rate, regular rhythm and intact distal pulses. No M/R/G Pulmonary/chest: Effort normal and breath sounds normal. No wheezing, rales or rhonchi. Abdominal: Soft, nontender, nondistended. Bowel sounds active throughout. There are no masses palpable. No hepatosplenomegaly. Extremities: no clubbing, cyanosis, or edema Lymphadenopathy: No cervical adenopathy noted. Neurological: Alert and oriented to person place and time. Skin: Skin is warm and dry. No rashes noted. Psychiatric: Normal mood and affect. Behavior is normal.  Celiac panel and TSH rechecked recently and normal.    Assessment & Plan:  51 yo female with PMH of IBS, fibromyalgia, GERD, arthritis, and adenomatous colon polyp who is seen in follow-up.  1. RLQ pain -- the etiology of the patient's intermittent right lower quadrant abdominal pain is unknown. She has had upper and lower endoscopy, as well as an ultrasound all of which did not explain this pain. She has not had cross sectional imaging, and I recommended CT scan of the abdomen and pelvis with contrast for further characterization of this pain. I have recommended that she continue with the MiraLAX 17 g daily, and the probiotic. I also discussed pancreatic enzyme replacement with her, and I do not think this will harm her in any way, and there is some evidence to suggest it may help in irritable bowel syndrome. Therefore she will continue this for  now.  2. GERD -- it seems with once daily ranitidine, her GERD symptoms are incompletely controlled, which is not surprising. I recommended twice daily ranitidine 150 mg. She is  agreeable to try this. She did have mild gastritis had upper endoscopy, but was H. pylori negative. This may be due use of occasional NSAIDs.  3. History of adenomatous colon polyp -- she is due repeat colonoscopy for adenoma surveillance in November 2017.  F/u 3 months

## 2011-07-23 ENCOUNTER — Ambulatory Visit (INDEPENDENT_AMBULATORY_CARE_PROVIDER_SITE_OTHER)
Admission: RE | Admit: 2011-07-23 | Discharge: 2011-07-23 | Disposition: A | Discharge status: Home / Self Care | Payer: Medicare Other | Source: Ambulatory Visit | Attending: Internal Medicine | Admitting: Internal Medicine

## 2011-07-23 DIAGNOSIS — K219 Gastro-esophageal reflux disease without esophagitis: Secondary | ICD-10-CM

## 2011-07-23 DIAGNOSIS — R1013 Epigastric pain: Secondary | ICD-10-CM

## 2011-07-23 MED ORDER — IOHEXOL 300 MG/ML  SOLN
80.0000 mL | Freq: Once | INTRAMUSCULAR | Status: AC | PRN
  Administered 2011-07-23: 80 mL via INTRAVENOUS

## 2011-08-19 ENCOUNTER — Telehealth: Payer: Self-pay | Admitting: Internal Medicine

## 2011-08-19 ENCOUNTER — Other Ambulatory Visit: Payer: Self-pay | Admitting: Gastroenterology

## 2011-08-19 DIAGNOSIS — R1013 Epigastric pain: Secondary | ICD-10-CM

## 2011-08-19 DIAGNOSIS — K219 Gastro-esophageal reflux disease without esophagitis: Secondary | ICD-10-CM

## 2011-08-19 MED ORDER — POLYETHYLENE GLYCOL 3350 17 G PO PACK: 17.0000 g | PACK | Freq: Every day | ORAL | Status: DC

## 2011-08-19 NOTE — Telephone Encounter (Signed)
Sent Rx for miralax daily, to pt's pharmacy.

## 2012-02-19 ENCOUNTER — Other Ambulatory Visit: Payer: Self-pay | Admitting: Internal Medicine

## 2012-08-06 ENCOUNTER — Other Ambulatory Visit: Payer: Self-pay | Admitting: Internal Medicine

## 2012-10-20 ENCOUNTER — Ambulatory Visit
Admission: RE | Admit: 2012-10-20 | Discharge: 2012-10-20 | Disposition: A | Discharge status: Home / Self Care | Payer: Medicare Other | Source: Ambulatory Visit | Attending: Family Medicine | Admitting: Family Medicine

## 2012-10-20 ENCOUNTER — Other Ambulatory Visit: Payer: Self-pay | Admitting: Family Medicine

## 2012-10-20 DIAGNOSIS — M25572 Pain in left ankle and joints of left foot: Secondary | ICD-10-CM

## 2012-11-29 ENCOUNTER — Other Ambulatory Visit: Payer: Self-pay | Admitting: Internal Medicine

## 2013-05-10 ENCOUNTER — Telehealth: Payer: Self-pay | Admitting: Internal Medicine

## 2013-06-21 ENCOUNTER — Telehealth: Payer: Self-pay | Admitting: Gastroenterology

## 2013-06-21 ENCOUNTER — Telehealth: Payer: Self-pay | Admitting: Internal Medicine

## 2013-06-21 ENCOUNTER — Other Ambulatory Visit: Payer: Self-pay | Admitting: Gastroenterology

## 2013-06-21 MED ORDER — SACCHAROMYCES BOULARDII 250 MG PO CAPS: 250.0000 mg | ORAL_CAPSULE | Freq: Two times a day (BID) | ORAL | Status: DC

## 2013-06-21 NOTE — Telephone Encounter (Signed)
Spoke to pt. Told her I have sent in a prior auth for miralax that was rejected and I am appealing it and will hopefully hear something in the next day or so. She asked about another probiotic since Align was so expensive, I told her I will ask Dr. Hilarie Fredrickson what he recommends and call her back tomorrow. Pt was very appreciative and understood.

## 2013-06-21 NOTE — Telephone Encounter (Signed)
Message copied by Annabell Sabal on Tue Jun 21, 2013  4:05 PM ------      Message from: Jerene Bears      Created: Tue Jun 21, 2013 11:17 AM       Florastor 250 mg twice daily      If symptoms have worsened or changed, she should be seen again in clinic.      JMP            ----- Message -----         From: Annabell Sabal, CMA         Sent: 06/21/2013  10:15 AM           To: Jerene Bears, MD            Pt would like to know your recommendation for another probiotic. She is not happy with the results of Align.        ------

## 2013-06-21 NOTE — Telephone Encounter (Signed)
Sent in an Rx for florastor 250 mg bid

## 2014-01-26 ENCOUNTER — Other Ambulatory Visit: Payer: Self-pay | Admitting: Internal Medicine

## 2014-02-01 ENCOUNTER — Other Ambulatory Visit: Payer: Self-pay | Admitting: Internal Medicine

## 2014-02-01 ENCOUNTER — Telehealth: Payer: Self-pay | Admitting: Internal Medicine

## 2014-02-01 MED ORDER — RANITIDINE HCL 150 MG PO TABS: ORAL_TABLET | ORAL | Status: DC

## 2014-02-01 NOTE — Telephone Encounter (Signed)
[place] for refill until followup

## 2014-02-01 NOTE — Telephone Encounter (Signed)
Rx sent. Patient advised. 

## 2014-02-01 NOTE — Telephone Encounter (Signed)
Dr Hilarie Fredrickson, patient last seen 06/2011. She is scheduled for follow up 04/06/14. She wants refills of her zantac until this. Last rx filled 03/2013. I advised her to purchase Zantac OTC until office visit, however, she states it is quite a bit more expensive OTC and wanted me to ask you if she could be given additional refills. Please advise.Marland KitchenMarland Kitchen

## 2014-04-06 ENCOUNTER — Ambulatory Visit: Payer: Medicare Other | Admitting: Internal Medicine

## 2014-05-18 ENCOUNTER — Encounter: Payer: Self-pay | Admitting: *Deleted

## 2014-05-25 ENCOUNTER — Encounter: Payer: Self-pay | Admitting: Internal Medicine

## 2014-05-25 ENCOUNTER — Ambulatory Visit (INDEPENDENT_AMBULATORY_CARE_PROVIDER_SITE_OTHER): Payer: Medicare Other | Admitting: Internal Medicine

## 2014-05-25 VITALS — BP 122/66 | HR 84 | Ht 60.0 in | Wt 103.2 lb

## 2014-05-25 DIAGNOSIS — Z86010 Personal history of colonic polyps: Secondary | ICD-10-CM

## 2014-05-25 DIAGNOSIS — K5900 Constipation, unspecified: Secondary | ICD-10-CM

## 2014-05-25 DIAGNOSIS — K219 Gastro-esophageal reflux disease without esophagitis: Secondary | ICD-10-CM

## 2014-05-25 DIAGNOSIS — K589 Irritable bowel syndrome without diarrhea: Secondary | ICD-10-CM

## 2014-05-25 HISTORY — DX: Constipation, unspecified: K59.00

## 2014-05-25 MED ORDER — LACTULOSE 10 GM/15ML PO SOLN: ORAL | Status: DC

## 2014-05-25 MED ORDER — RANITIDINE HCL 150 MG PO TABS: ORAL_TABLET | ORAL | Status: DC

## 2014-05-25 NOTE — Patient Instructions (Signed)
Please discontinue Miralax.  We have sent the following medications to your pharmacy for you to pick up at your convenience: Lactulose-Take 15-30 ml daily Ranitidine 150 mg twice daily  Please follow up with Dr Hilarie Fredrickson in 1 year.  You will be due for a recall colonoscopy in 01/2016. We will send you a reminder in the mail when it gets closer to that time.  CC:Dr Dibas Koirala

## 2014-05-25 NOTE — Progress Notes (Signed)
   Subjective:    Patient ID: Elaine Le, female    DOB: 1961-03-16, 54 y.o.   MRN: [id number]  HPI Elaine Le is a 54 year old female with past medical history of IBS, adenomatous colon polyp, GERD, fibromyalgia and arthritis/sacroiliitis, Raynaud's who is seen in follow-up. She is here alone today. She reports she is still having issues with constipation. The insurance is no longer paying for her MiraLAX. This was also causing gas and bloating. She was having a hard time finding a dose that led to complete evacuation. She tried daily and twice daily dosing without great success. Previously she is use lactulose which she says worked very well. She did try Linzess which she also did not find overly helpful. No blood in her stool or melena. Occasional lower abdominal cramping pain relieved by defecation which she relates to IBS. Good appetite. No dysphagia. No odynophagia. No nausea or vomiting. She is taking Zantac 150 mg once daily and occasionally has breakthrough heartburn in the evening. She would like to take this twice daily if her prescription could be changed   Review of Systems As per history of present illness, otherwise negative  Current Medications, Allergies, Past Medical History, Past Surgical History, Family History and Social History were reviewed in [employer] record.     Objective:   Physical Exam BP 122/66 mmHg  Pulse 84  Ht 5' (1.524 m)  Wt 103 lb 4 oz (46.834 kg)  BMI 20.16 kg/m2 Constitutional: Well-developed and well-nourished. No distress. HEENT: Normocephalic and atraumatic. Oropharynx is clear and moist. No oropharyngeal exudate. Conjunctivae are normal.  No scleral icterus. Neck: Neck supple. Trachea midline. Cardiovascular: Normal rate, regular rhythm and intact distal pulses. No M/R/G Pulmonary/chest: Effort normal and breath sounds normal. No wheezing, rales or rhonchi. Abdominal: Soft, nontender, nondistended. Bowel  sounds active throughout. There are no masses palpable. No hepatosplenomegaly. Extremities: no clubbing, cyanosis, or edema Neurological: Alert and oriented to person place and time. Skin: Skin is warm and dry. No rashes noted. Psychiatric: Normal mood and affect. Behavior is normal.  EGD/colon reviewed Prior neg celiac panel      Assessment & Plan:   54 year old female with past medical history of IBS, adenomatous colon polyp, GERD, fibromyalgia and arthritis/sacroiliitis, Raynaud's who is seen in follow-up.  1. GERD -- some breakthrough on ranitidine once daily. Increase ranitidine to 150 mg twice a day. Alert me if symptoms not completely controlled. She voices understanding  2. IBS with constipation -- will switch back to lactulose 15-30 mL's daily. Discontinue MiraLAX. Call if no improvement constipation  3. History of adenomatous colon polyp -- she is reminded surveillance colonoscopy due November 2017.  Follow-up in one year

## 2014-06-21 ENCOUNTER — Inpatient Hospital Stay (HOSPITAL_COMMUNITY)
Admission: EM | Admit: 2014-06-21 | Discharge: 2014-07-11 | DRG: 870 | Disposition: A | Discharge status: Home / Self Care | Payer: Medicare Other | Attending: Pulmonary Disease | Admitting: Pulmonary Disease

## 2014-06-21 ENCOUNTER — Inpatient Hospital Stay (HOSPITAL_COMMUNITY): Payer: Medicare Other

## 2014-06-21 ENCOUNTER — Encounter (HOSPITAL_COMMUNITY): Payer: Self-pay | Admitting: Emergency Medicine

## 2014-06-21 ENCOUNTER — Emergency Department (HOSPITAL_COMMUNITY): Payer: Medicare Other

## 2014-06-21 DIAGNOSIS — M549 Dorsalgia, unspecified: Secondary | ICD-10-CM | POA: Diagnosis present

## 2014-06-21 DIAGNOSIS — G44009 Cluster headache syndrome, unspecified, not intractable: Secondary | ICD-10-CM | POA: Diagnosis present

## 2014-06-21 DIAGNOSIS — E871 Hypo-osmolality and hyponatremia: Secondary | ICD-10-CM | POA: Diagnosis present

## 2014-06-21 DIAGNOSIS — J90 Pleural effusion, not elsewhere classified: Secondary | ICD-10-CM | POA: Diagnosis not present

## 2014-06-21 DIAGNOSIS — R739 Hyperglycemia, unspecified: Secondary | ICD-10-CM | POA: Diagnosis present

## 2014-06-21 DIAGNOSIS — I052 Rheumatic mitral stenosis with insufficiency: Secondary | ICD-10-CM | POA: Diagnosis present

## 2014-06-21 DIAGNOSIS — J9602 Acute respiratory failure with hypercapnia: Secondary | ICD-10-CM

## 2014-06-21 DIAGNOSIS — J9600 Acute respiratory failure, unspecified whether with hypoxia or hypercapnia: Secondary | ICD-10-CM

## 2014-06-21 DIAGNOSIS — R32 Unspecified urinary incontinence: Secondary | ICD-10-CM | POA: Diagnosis present

## 2014-06-21 DIAGNOSIS — I340 Nonrheumatic mitral (valve) insufficiency: Secondary | ICD-10-CM | POA: Diagnosis not present

## 2014-06-21 DIAGNOSIS — R29898 Other symptoms and signs involving the musculoskeletal system: Secondary | ICD-10-CM

## 2014-06-21 DIAGNOSIS — Z681 Body mass index (BMI) 19 or less, adult: Secondary | ICD-10-CM | POA: Diagnosis not present

## 2014-06-21 DIAGNOSIS — J441 Chronic obstructive pulmonary disease with (acute) exacerbation: Secondary | ICD-10-CM | POA: Diagnosis present

## 2014-06-21 DIAGNOSIS — I959 Hypotension, unspecified: Secondary | ICD-10-CM | POA: Diagnosis present

## 2014-06-21 DIAGNOSIS — J9601 Acute respiratory failure with hypoxia: Secondary | ICD-10-CM | POA: Diagnosis not present

## 2014-06-21 DIAGNOSIS — K219 Gastro-esophageal reflux disease without esophagitis: Secondary | ICD-10-CM | POA: Diagnosis present

## 2014-06-21 DIAGNOSIS — K648 Other hemorrhoids: Secondary | ICD-10-CM | POA: Diagnosis present

## 2014-06-21 DIAGNOSIS — J329 Chronic sinusitis, unspecified: Secondary | ICD-10-CM | POA: Diagnosis present

## 2014-06-21 DIAGNOSIS — Z7982 Long term (current) use of aspirin: Secondary | ICD-10-CM

## 2014-06-21 DIAGNOSIS — K5790 Diverticulosis of intestine, part unspecified, without perforation or abscess without bleeding: Secondary | ICD-10-CM | POA: Diagnosis present

## 2014-06-21 DIAGNOSIS — J09X1 Influenza due to identified novel influenza A virus with pneumonia: Secondary | ICD-10-CM | POA: Diagnosis present

## 2014-06-21 DIAGNOSIS — E43 Unspecified severe protein-calorie malnutrition: Secondary | ICD-10-CM | POA: Diagnosis present

## 2014-06-21 DIAGNOSIS — G9340 Encephalopathy, unspecified: Secondary | ICD-10-CM | POA: Diagnosis not present

## 2014-06-21 DIAGNOSIS — Z885 Allergy status to narcotic agent status: Secondary | ICD-10-CM

## 2014-06-21 DIAGNOSIS — J918 Pleural effusion in other conditions classified elsewhere: Secondary | ICD-10-CM | POA: Diagnosis present

## 2014-06-21 DIAGNOSIS — I050 Rheumatic mitral stenosis: Secondary | ICD-10-CM | POA: Diagnosis not present

## 2014-06-21 DIAGNOSIS — J101 Influenza due to other identified influenza virus with other respiratory manifestations: Secondary | ICD-10-CM | POA: Diagnosis present

## 2014-06-21 DIAGNOSIS — G9341 Metabolic encephalopathy: Secondary | ICD-10-CM | POA: Diagnosis present

## 2014-06-21 DIAGNOSIS — E876 Hypokalemia: Secondary | ICD-10-CM | POA: Diagnosis not present

## 2014-06-21 DIAGNOSIS — K589 Irritable bowel syndrome without diarrhea: Secondary | ICD-10-CM | POA: Diagnosis present

## 2014-06-21 DIAGNOSIS — Z9289 Personal history of other medical treatment: Secondary | ICD-10-CM

## 2014-06-21 DIAGNOSIS — Z86010 Personal history of colonic polyps: Secondary | ICD-10-CM | POA: Diagnosis not present

## 2014-06-21 DIAGNOSIS — M797 Fibromyalgia: Secondary | ICD-10-CM | POA: Diagnosis present

## 2014-06-21 DIAGNOSIS — M1990 Unspecified osteoarthritis, unspecified site: Secondary | ICD-10-CM | POA: Diagnosis present

## 2014-06-21 DIAGNOSIS — E870 Hyperosmolality and hypernatremia: Secondary | ICD-10-CM | POA: Diagnosis not present

## 2014-06-21 DIAGNOSIS — F17210 Nicotine dependence, cigarettes, uncomplicated: Secondary | ICD-10-CM | POA: Diagnosis present

## 2014-06-21 DIAGNOSIS — Z978 Presence of other specified devices: Secondary | ICD-10-CM

## 2014-06-21 DIAGNOSIS — R6521 Severe sepsis with septic shock: Secondary | ICD-10-CM | POA: Diagnosis present

## 2014-06-21 DIAGNOSIS — J189 Pneumonia, unspecified organism: Secondary | ICD-10-CM | POA: Diagnosis present

## 2014-06-21 DIAGNOSIS — T85598A Other mechanical complication of other gastrointestinal prosthetic devices, implants and grafts, initial encounter: Secondary | ICD-10-CM

## 2014-06-21 DIAGNOSIS — I330 Acute and subacute infective endocarditis: Secondary | ICD-10-CM

## 2014-06-21 DIAGNOSIS — Z452 Encounter for adjustment and management of vascular access device: Secondary | ICD-10-CM

## 2014-06-21 DIAGNOSIS — R1313 Dysphagia, pharyngeal phase: Secondary | ICD-10-CM | POA: Diagnosis not present

## 2014-06-21 DIAGNOSIS — E872 Acidosis: Secondary | ICD-10-CM | POA: Diagnosis not present

## 2014-06-21 DIAGNOSIS — Z4659 Encounter for fitting and adjustment of other gastrointestinal appliance and device: Secondary | ICD-10-CM

## 2014-06-21 DIAGNOSIS — R0602 Shortness of breath: Secondary | ICD-10-CM

## 2014-06-21 DIAGNOSIS — K449 Diaphragmatic hernia without obstruction or gangrene: Secondary | ICD-10-CM | POA: Diagnosis present

## 2014-06-21 DIAGNOSIS — A419 Sepsis, unspecified organism: Principal | ICD-10-CM | POA: Diagnosis present

## 2014-06-21 HISTORY — DX: Acute respiratory failure with hypercapnia: J96.02

## 2014-06-21 LAB — BLOOD GAS, ARTERIAL
Acid-base deficit: 8.4 mmol/L — ABNORMAL HIGH (ref 0.0–2.0)
Acid-base deficit: 11.9 mmol/L — ABNORMAL HIGH (ref 0.0–2.0)
BICARBONATE: 23.4 meq/L (ref 20.0–24.0)
BICARBONATE: 18.9 meq/L — AB (ref 20.0–24.0)
Drawn by: 257701
Drawn by: 422461
FIO2: 1 %
FIO2: 1 %
MECHVT: 450 mL
O2 Saturation: 97.2 %
O2 Saturation: 98.4 %
PATIENT TEMPERATURE: 97
PEEP/CPAP: 5 cmH2O
PH ART: 7.105 — AB (ref 7.350–7.450)
PO2 ART: 438 mmHg — AB (ref 80.0–100.0)
Patient temperature: 98.6
RATE: 15 resp/min
TCO2: 22.2 mmol/L (ref 0–100)
TCO2: 18.5 mmol/L (ref 0–100)
pCO2 arterial: 77.8 mmHg (ref 35.0–45.0)
pCO2 arterial: 63.8 mmHg (ref 35.0–45.0)
pH, Arterial: 7.092 — CL (ref 7.350–7.450)
pO2, Arterial: 162 mmHg — ABNORMAL HIGH (ref 80.0–100.0)

## 2014-06-21 LAB — URINALYSIS, ROUTINE W REFLEX MICROSCOPIC
Bilirubin Urine: NEGATIVE
Glucose, UA: 500 mg/dL — AB
Ketones, ur: NEGATIVE mg/dL
LEUKOCYTES UA: NEGATIVE
NITRITE: NEGATIVE
PROTEIN: NEGATIVE mg/dL
UROBILINOGEN UA: 0.2 mg/dL (ref 0.0–1.0)
pH: 5 (ref 5.0–8.0)

## 2014-06-21 LAB — COMPREHENSIVE METABOLIC PANEL
ALBUMIN: 4.3 g/dL (ref 3.5–5.2)
ALT: 17 U/L (ref 0–35)
AST: 39 U/L — ABNORMAL HIGH (ref 0–37)
Alkaline Phosphatase: 77 U/L (ref 39–117)
Anion gap: 11 (ref 5–15)
BUN: 10 mg/dL (ref 6–23)
CALCIUM: 8.7 mg/dL (ref 8.4–10.5)
CO2: 25 mmol/L (ref 19–32)
CREATININE: 0.63 mg/dL (ref 0.50–1.10)
Chloride: 92 mmol/L — ABNORMAL LOW (ref 96–112)
GFR calc Af Amer: >90 mL/min (ref >90)
GFR calc non Af Amer: >90 mL/min (ref >90)
Glucose, Bld: 116 mg/dL — ABNORMAL HIGH (ref 70–99)
Potassium: 3.9 mmol/L (ref 3.5–5.1)
Sodium: 128 mmol/L — ABNORMAL LOW (ref 135–145)
TOTAL PROTEIN: 7.3 g/dL (ref 6.0–8.3)
Total Bilirubin: 0.8 mg/dL (ref 0.3–1.2)

## 2014-06-21 LAB — CBC
HEMATOCRIT: 44.1 % (ref 36.0–46.0)
Hemoglobin: 15.1 g/dL — ABNORMAL HIGH (ref 12.0–15.0)
MCH: 30.8 pg (ref 26.0–34.0)
MCHC: 34.2 g/dL (ref 30.0–36.0)
MCV: 89.8 fL (ref 78.0–100.0)
Platelets: 208 10*3/uL (ref 150–400)
RBC: 4.91 MIL/uL (ref 3.87–5.11)
RDW: 14 % (ref 11.5–15.5)
WBC: 7.8 10*3/uL (ref 4.0–10.5)

## 2014-06-21 LAB — URINE MICROSCOPIC-ADD ON

## 2014-06-21 LAB — I-STAT TROPONIN, ED: TROPONIN I, POC: 0.01 ng/mL (ref 0.00–0.08)

## 2014-06-21 LAB — D-DIMER, QUANTITATIVE: D-Dimer, Quant: 0.75 ug/mL-FEU — ABNORMAL HIGH (ref 0.00–0.48)

## 2014-06-21 LAB — I-STAT CG4 LACTIC ACID, ED: Lactic Acid, Venous: 0.85 mmol/L (ref 0.5–2.0)

## 2014-06-21 LAB — BRAIN NATRIURETIC PEPTIDE: B Natriuretic Peptide: 277.1 pg/mL — ABNORMAL HIGH (ref 0.0–100.0)

## 2014-06-21 MED ORDER — ETOMIDATE 2 MG/ML IV SOLN
INTRAVENOUS | Status: AC
  Filled 2014-06-21: qty 20

## 2014-06-21 MED ORDER — SUCCINYLCHOLINE CHLORIDE 20 MG/ML IJ SOLN
INTRAMUSCULAR | Status: AC
  Filled 2014-06-21: qty 1

## 2014-06-21 MED ORDER — IOHEXOL 350 MG/ML SOLN
100.0000 mL | Freq: Once | INTRAVENOUS | Status: AC | PRN
  Administered 2014-06-21: 100 mL via INTRAVENOUS

## 2014-06-21 MED ORDER — DEXTROSE 5 % IV SOLN
1.0000 g | INTRAVENOUS | Status: AC
  Administered 2014-06-22 – 2014-06-28 (×7): 1 g via INTRAVENOUS
  Filled 2014-06-21 (×7): qty 10

## 2014-06-21 MED ORDER — IPRATROPIUM-ALBUTEROL 0.5-2.5 (3) MG/3ML IN SOLN
3.0000 mL | RESPIRATORY_TRACT | Status: DC
  Administered 2014-06-21: 3 mL via RESPIRATORY_TRACT
  Filled 2014-06-21: qty 3

## 2014-06-21 MED ORDER — SODIUM CHLORIDE 0.9 % IV SOLN
250.0000 mL | INTRAVENOUS | Status: DC | PRN
  Administered 2014-06-27: 250 mL via INTRAVENOUS

## 2014-06-21 MED ORDER — PROPOFOL 10 MG/ML IV EMUL
230.0000 ug/min | INTRAVENOUS | Status: DC
  Administered 2014-06-21: 234 ug/min via INTRAVENOUS
  Filled 2014-06-21 (×2): qty 100

## 2014-06-21 MED ORDER — SODIUM CHLORIDE 0.9 % IV SOLN
25.0000 ug/h | INTRAVENOUS | Status: DC
  Administered 2014-06-21: 50 ug/h via INTRAVENOUS
  Filled 2014-06-21: qty 50

## 2014-06-21 MED ORDER — DEXTROSE-NACL 5-0.45 % IV SOLN: INTRAVENOUS | Status: DC

## 2014-06-21 MED ORDER — SUCCINYLCHOLINE CHLORIDE 20 MG/ML IJ SOLN: 70.0000 mg | Freq: Once | INTRAMUSCULAR | Status: DC

## 2014-06-21 MED ORDER — ETOMIDATE 2 MG/ML IV SOLN
12.0000 mg | Freq: Once | INTRAVENOUS | Status: DC
Start: 2014-06-21 — End: 2014-06-23

## 2014-06-21 MED ORDER — DEXTROSE 5 % IV SOLN
500.0000 mg | INTRAVENOUS | Status: DC
  Administered 2014-06-22 – 2014-06-23 (×2): 500 mg via INTRAVENOUS
  Filled 2014-06-21 (×2): qty 500

## 2014-06-21 MED ORDER — ROCURONIUM BROMIDE 50 MG/5ML IV SOLN
INTRAVENOUS | Status: AC
  Filled 2014-06-21: qty 2

## 2014-06-21 MED ORDER — ALBUTEROL SULFATE (2.5 MG/3ML) 0.083% IN NEBU
5.0000 mg | INHALATION_SOLUTION | Freq: Once | RESPIRATORY_TRACT | Status: AC
  Administered 2014-06-21: 5 mg via RESPIRATORY_TRACT
  Filled 2014-06-21: qty 6

## 2014-06-21 MED ORDER — DEXTROSE 5 % IV SOLN
500.0000 mg | Freq: Once | INTRAVENOUS | Status: AC
  Administered 2014-06-21: 500 mg via INTRAVENOUS
  Filled 2014-06-21: qty 500

## 2014-06-21 MED ORDER — MIDAZOLAM HCL 2 MG/2ML IJ SOLN
2.0000 mg | Freq: Once | INTRAMUSCULAR | Status: AC
  Administered 2014-06-21: 2 mg via INTRAVENOUS
  Filled 2014-06-21: qty 2

## 2014-06-21 MED ORDER — FENTANYL BOLUS VIA INFUSION
50.0000 ug | INTRAVENOUS | Status: DC | PRN
  Filled 2014-06-21: qty 50

## 2014-06-21 MED ORDER — NOREPINEPHRINE BITARTRATE 1 MG/ML IV SOLN
0.0000 ug/min | Freq: Once | INTRAVENOUS | Status: AC
  Administered 2014-06-21: 5 ug/min via INTRAVENOUS
  Filled 2014-06-21: qty 4

## 2014-06-21 MED ORDER — IPRATROPIUM-ALBUTEROL 0.5-2.5 (3) MG/3ML IN SOLN
3.0000 mL | Freq: Four times a day (QID) | RESPIRATORY_TRACT | Status: DC
  Administered 2014-06-22 (×2): 3 mL via RESPIRATORY_TRACT
  Filled 2014-06-21 (×2): qty 3

## 2014-06-21 MED ORDER — ACETAMINOPHEN 325 MG PO TABS: 650.0000 mg | ORAL_TABLET | ORAL | Status: DC | PRN

## 2014-06-21 MED ORDER — IPRATROPIUM BROMIDE 0.02 % IN SOLN
0.5000 mg | Freq: Once | RESPIRATORY_TRACT | Status: AC
  Administered 2014-06-21: 0.5 mg via RESPIRATORY_TRACT
  Filled 2014-06-21: qty 2.5

## 2014-06-21 MED ORDER — ALBUTEROL SULFATE (2.5 MG/3ML) 0.083% IN NEBU
2.5000 mg | INHALATION_SOLUTION | RESPIRATORY_TRACT | Status: DC | PRN
  Administered 2014-07-03: 2.5 mg via RESPIRATORY_TRACT
  Filled 2014-06-21: qty 3

## 2014-06-21 MED ORDER — METHYLPREDNISOLONE SODIUM SUCC 125 MG IJ SOLR
80.0000 mg | Freq: Four times a day (QID) | INTRAMUSCULAR | Status: DC
  Administered 2014-06-21 – 2014-06-24 (×11): 80 mg via INTRAVENOUS
  Filled 2014-06-21 (×11): qty 2

## 2014-06-21 MED ORDER — PROPOFOL 10 MG/ML IV EMUL
0.0000 ug/min | INTRAVENOUS | Status: DC
  Administered 2014-06-21: 5 ug/min via INTRAVENOUS
  Administered 2014-06-22: 1650 ug/min via INTRAVENOUS

## 2014-06-21 MED ORDER — FENTANYL CITRATE 0.05 MG/ML IJ SOLN
50.0000 ug | Freq: Once | INTRAMUSCULAR | Status: AC
  Administered 2014-06-21: 50 ug via INTRAVENOUS
  Filled 2014-06-21: qty 2

## 2014-06-21 MED ORDER — ACETAMINOPHEN 325 MG PO TABS
650.0000 mg | ORAL_TABLET | Freq: Four times a day (QID) | ORAL | Status: DC | PRN
  Administered 2014-06-21: 650 mg via ORAL
  Filled 2014-06-21: qty 2

## 2014-06-21 MED ORDER — SODIUM CHLORIDE 0.9 % IV SOLN
INTRAVENOUS | Status: DC
Start: 2014-06-21 — End: 2014-06-27
  Administered 2014-06-21 – 2014-06-24 (×6): via INTRAVENOUS

## 2014-06-21 MED ORDER — LIDOCAINE HCL (CARDIAC) 20 MG/ML IV SOLN
INTRAVENOUS | Status: AC
  Filled 2014-06-21: qty 5

## 2014-06-21 MED ORDER — ENOXAPARIN SODIUM 40 MG/0.4ML ~~LOC~~ SOLN
40.0000 mg | SUBCUTANEOUS | Status: DC
  Administered 2014-06-21 – 2014-07-10 (×20): 40 mg via SUBCUTANEOUS
  Filled 2014-06-21 (×21): qty 0.4

## 2014-06-21 MED ORDER — DEXTROSE 5 % IV SOLN
1.0000 g | Freq: Once | INTRAVENOUS | Status: AC
  Administered 2014-06-21: 1 g via INTRAVENOUS
  Filled 2014-06-21: qty 10

## 2014-06-21 MED ORDER — PANTOPRAZOLE SODIUM 40 MG IV SOLR
40.0000 mg | Freq: Every day | INTRAVENOUS | Status: DC
  Administered 2014-06-21 – 2014-06-26 (×6): 40 mg via INTRAVENOUS
  Filled 2014-06-21 (×6): qty 40

## 2014-06-21 MED ORDER — SODIUM CHLORIDE 0.9 % IV BOLUS (SEPSIS)
500.0000 mL | Freq: Once | INTRAVENOUS | Status: AC
  Administered 2014-06-21: 500 mL via INTRAVENOUS

## 2014-06-21 MED ORDER — SODIUM CHLORIDE 0.9 % IV BOLUS (SEPSIS)
1000.0000 mL | Freq: Once | INTRAVENOUS | Status: AC
  Administered 2014-06-21: 1000 mL via INTRAVENOUS

## 2014-06-21 NOTE — ED Notes (Signed)
Nor-epi at 5 mcg/min per left IJ via medial port triple lumen, propofol at 35 mcg/kg/min and fentanyl at 75 mcg/min per pump-patient is sedated, family at bedside, all belongings to family at bedside, removed 2 rings and given to sister at bedside.

## 2014-06-21 NOTE — ED Notes (Signed)
Central line placed per Dr. Kathy Breach with Propofol 50 mg prior to insertion/patient fighting and coughing against ventilator

## 2014-06-21 NOTE — Progress Notes (Signed)
Utilization Review completed.  Anaclara Acklin RN CM  

## 2014-06-21 NOTE — Procedures (Signed)
Central Venous Catheter Insertion Procedure Note Elaine Le [id number] 06-28-1960  Procedure: Insertion of Central Venous Catheter Indications: Assessment of intravascular volume, Drug and/or fluid administration and Frequent blood sampling  Procedure Details Consent: Risks of procedure as well as the alternatives and risks of each were explained to the (patient/caregiver).  Consent for procedure obtained. Time Out: Verified patient identification, verified procedure, site/side was marked, verified correct patient position, special equipment/implants available, medications/allergies/relevent history reviewed, required imaging and test results available.  Performed  Maximum sterile technique was used including antiseptics, cap, gloves, gown, hand hygiene, mask and sheet. Skin prep: Chlorhexidine; local anesthetic administered A antimicrobial bonded/coated triple lumen catheter was placed in the left internal jugular vein using the Seldinger technique.  Evaluation Blood flow good Complications: No apparent complications Patient did tolerate procedure well. Chest X-ray ordered to verify placement.  CXR: pending.  Procedure performed under direct ultrasound guidance for real time vessel cannulation.      Elaine Le, Elaine [facility] Pager: [phone]  or [phone] 06/21/2014, 8:54 PM

## 2014-06-21 NOTE — ED Notes (Signed)
Patient continues to cough and gag against ETT, swelling noted to neck, no crepitus palpated/central line insertion site oozing blood around left IJ area-Dr. Shearon Stalls at bedside and evaluated, portable CXR post central line insertion site and soft tissue of neck completed and reviewed by Dr. Dessa Phi line okayed to use/pressure dressing to left side of neck per Dr. Shearon Stalls

## 2014-06-21 NOTE — ED Notes (Signed)
Propofol titrated up to 15 mcg/min/patient coughing against ETT

## 2014-06-21 NOTE — ED Notes (Signed)
Nor-epi increased to 10 mcg/min due to SBP low 80's and low 90's

## 2014-06-21 NOTE — ED Notes (Signed)
Update given to [facility]

## 2014-06-21 NOTE — H&P (Signed)
PULMONARY / CRITICAL CARE MEDICINE   Name: Elaine Le MRN: [id number] DOB: 1960/05/23    ADMISSION DATE:  06/21/2014 CONSULTATION DATE:  06/21/2014  REFERRING MD :  EDP  CHIEF COMPLAINT:  SOB  INITIAL PRESENTATION:  54 y.o. brought to [facility] ED 2/24 with SOB and cough.  In ED, found to have significant respiratory distress.  She failed BiPAP and was ultimately intubated.  PCCM called for admission.   STUDIES:  CTA chest 2/24 >>> no PE, underlying emphysema.  Bilateral PNA, right hilar and mediastinal reactive lymphadenopathy.  SIGNIFICANT EVENTS: 2/24 - admit, intubated   HISTORY OF PRESENT ILLNESS:  Pt is encephalopathic; therefore, this HPI is obtained from chart review. Elaine Le is a 54 y.o. F with PMH as outlined below.  She was brought to [facility] ED 2/24 with SOB and cough x 3 days.  She is a smoker, close to 1 ppd for unknown # of years.  Her mother and father state that she began to "feel bad" 3 days ago; however, symptoms seemed to have improved somewhat one day prior to ED presentation.  Unfortunately on day of presentation, pt began coughing more and felt more congested.  She called her best friend and reported that she couldn't breathe very well.  Friend instructed her to seek medical attention so she called her sister who brought her to ED.  No known fevers/chills/sweats, chest pain, N/V/D, myalgias, LE edema or pain.  No recent long trips or periods of immobilization.  No similar episodes in the past.  Her friend does think she had URI over the past few days. On ED arrival, she was found to be hypoxic and in significant respiratory distress.  ABG revealed significant respiratory acidosis for which she was placed on BiPAP but unfortunately failed.  She subsequently required intubation and PCCM was consulted for admission.   PAST MEDICAL HISTORY :   has a past medical history of Chronic sinusitis; Arthritis; Fibromyalgia; GERD (gastroesophageal reflux disease);  Irritable bowel syndrome (IBS); Headaches, cluster; Back pain; Fatigue; Muscle pain; Night sweats; Continuous urine leakage; Diverticulosis; Adenomatous colon polyp; Internal hemorrhoids; and Hiatal hernia.  has past surgical history that includes Thoracic outlet surgery; Tubal ligation; Vulva surgery; Neuroplasty / transposition median nerve at carpal tunnel bilateral; and Cystostomy w/ bladder dilation. Prior to Admission medications   Medication Sig Start Date End Date Taking? Authorizing Provider  aspirin 81 MG tablet Take 162 mg by mouth at bedtime.    Yes Historical Provider, MD  ibuprofen (ADVIL,MOTRIN) 200 MG tablet Take 400 mg by mouth every 6 (six) hours as needed (pain).    Yes Historical Provider, MD  lactulose (CHRONULAC) 10 GM/15ML solution Take 15-30 ml (1-2 tablespoons) daily 05/25/14  Yes Jerene Bears, MD  pseudoephedrine (SUDAFED) 30 MG tablet Take 60 mg by mouth every 6 (six) hours as needed for congestion (congestion).    Yes Historical Provider, MD  ranitidine (ZANTAC) 150 MG tablet TAKE 1 TABLET BY MOUTH EVERY DAY 05/25/14  Yes Jerene Bears, MD  sodium chloride (OCEAN) 0.65 % nasal spray Place 2 sprays into the nose daily as needed for congestion (congestion).    Yes Historical Provider, MD   Allergies  Allergen Reactions  . Morphine And Related     Altered mental state    FAMILY HISTORY:  Family History  Problem Relation Age of Onset  . Kidney cancer Mother   . Colon polyps Mother   . Irritable bowel syndrome Mother   . Diabetes  Sister   . Liver disease Sister   . Irritable bowel syndrome Daughter   . Diabetes Brother   . Colon cancer Neg Hx     SOCIAL HISTORY:  reports that she has been smoking Cigarettes.  She has been smoking about 0.50 packs per day. She has never used smokeless tobacco. She reports that she drinks alcohol. She reports that she does not use illicit drugs.  REVIEW OF SYSTEMS:  Unable to obtain as pt is intubated.  SUBJECTIVE:   VITAL  SIGNS: Temp:  [98.1 F (36.7 C)-100.5 F (38.1 C)] 98.7 F (37.1 C) (02/24 1949) Pulse Rate:  [109-119] 109 (02/24 1949) Resp:  [19-22] 19 (02/24 1949) BP: (119-166)/(55-81) 119/55 mmHg (02/24 1949) SpO2:  [91 %-100 %] 100 % (02/24 1949) FiO2 (%):  [100 %] 100 % (02/24 1935) HEMODYNAMICS:   VENTILATOR SETTINGS: Vent Mode:  [-] PRVC FiO2 (%):  [100 %] 100 % Set Rate:  [20 bmp] 20 bmp Vt Set:  [400 mL] 400 mL PEEP:  [5 cmH20] 5 cmH20 Plateau Pressure:  [27 cmH20] 27 cmH20 INTAKE / OUTPUT: Intake/Output      02/24 0701 - 02/25 0700   I.V. 1000   Total Intake 1000   Net +1000         PHYSICAL EXAMINATION: General: Thin female, in NAD. Neuro: Sedated on vent. HEENT: [place]/AT. PERRL, sclerae anicteric. Cardiovascular: RRR, no M/R/G.  Lungs: Respirations even and unlabored.  Expiratory wheezes bilaterally. Abdomen: BS x 4, soft, NT/ND.  Musculoskeletal: No gross deformities, no edema.  Skin: Intact, warm, no rashes.  LABS:  CBC  Recent Labs Lab 06/21/14 1515  WBC 7.8  HGB 15.1*  HCT 44.1  PLT 208   Coag's No results for input(s): APTT, INR in the last 168 hours. BMET  Recent Labs Lab 06/21/14 1515  NA 128*  K 3.9  CL 92*  CO2 25  BUN 10  CREATININE 0.63  GLUCOSE 116*   Electrolytes  Recent Labs Lab 06/21/14 1515  CALCIUM 8.7   Sepsis Markers  Recent Labs Lab 06/21/14 1639  LATICACIDVEN 0.85   ABG  Recent Labs Lab 06/21/14 1840  PHART 7.105*  PCO2ART 77.8*  PO2ART 162.0*   Liver Enzymes  Recent Labs Lab 06/21/14 1515  AST 39*  ALT 17  ALKPHOS 77  BILITOT 0.8  ALBUMIN 4.3   Cardiac Enzymes No results for input(s): TROPONINI, PROBNP in the last 168 hours. Glucose No results for input(s): GLUCAP in the last 168 hours.  Imaging Dg Chest 2 View  06/21/2014   CLINICAL DATA:  Two day history of cough and difficulty breathing. Fever  EXAM: CHEST  2 VIEW  COMPARISON:  None.  FINDINGS: There is a nipple shadow on the left. The  interstitium is mildly prominent in the lower lung zones, slightly greater on the right than on the left, probably reflecting chronic inflammatory type change. There is no frank edema or consolidation. Heart size and pulmonary vascularity are normal. No adenopathy. No bone lesions. There are surgical clips in the left axillary region.  IMPRESSION: Mild interstitial prominence in the lower lobes, probably reflecting chronic inflammatory type change. No frank edema or consolidation.   Electronically Signed   By: Lowella Grip III M.D.   On: 06/21/2014 15:58   Ct Angio Chest Pe W/cm &/or Wo Cm  06/21/2014   CLINICAL DATA:  54 year old female with shortness of breath and cough since Monday. Neck and back pain. Initial encounter. Hypoxia  EXAM: CT ANGIOGRAPHY CHEST  WITH CONTRAST  TECHNIQUE: Multidetector CT imaging of the chest was performed using the standard protocol during bolus administration of intravenous contrast. Multiplanar CT image reconstructions and MIPs were obtained to evaluate the vascular anatomy.  CONTRAST:  116mL OMNIPAQUE IOHEXOL 350 MG/ML SOLN  COMPARISON:  Chest radiographs 1540 hours today. CT Abdomen and Pelvis 07/23/2011.  FINDINGS: Good contrast bolus timing in the pulmonary arterial tree. Respiratory motion artifact in the lower lobes. No focal filling defect identified in the pulmonary arterial tree to suggest the presence of acute pulmonary embolism.  Compared to the lung bases on 07/23/2011, there is diffuse pulmonary septal thickening. There is also vague and nodular peribronchovascular in the lungs bilaterally, slightly greater on the right and maximal in the lower lobe. There is superimposed central lobular upper lobe predominant emphysema. Small right side Bochdalek hernia.  No pericardial or pleural effusion. Mildly enlarged hilar lymph nodes on the right. Indistinct increased mediastinal soft tissue about the carina measuring up to 10 mm short axis. Aberrant right subclavian  artery origin. Increased pretracheal soft tissue measuring up to 9 mm short axis at the level of the great vessels. Soft and calcified plaque in the descending thoracic aorta and continuing into the abdominal aorta.  Negative visualized liver, spleen, adrenal glands, kidneys, and bowel in the upper abdomen. No axillary lymphadenopathy.  No acute osseous abnormality identified.  Review of the MIP images confirms the above findings.  IMPRESSION: 1.  No evidence of acute pulmonary embolus. 2. Abnormal lungs with a combination of septal thickening and nodular peribronchovascular opacity. Underlying emphysema. No pleural fluid. Favor bilateral pneumonia over acute pulmonary edema. 3. Increased right hilar and mediastinal soft tissue favored to be reactive lymphadenopathy. 4. Aortic atherosclerosis.  Aberrant right subclavian artery.   Electronically Signed   By: Genevie Ann M.D.   On: 06/21/2014 17:23   Dg Chest Portable 1 View  06/21/2014   CLINICAL DATA:  Shortness of breath and respiratory failure. Status post intubation.  EXAM: PORTABLE CHEST - 1 VIEW  COMPARISON:  Chest x-ray at 1540 hours  FINDINGS: Endotracheal tube present with the tip approximately 3.8 cm above the carina. A nasogastric tube has been placed which extends below the diaphragm lungs show significant worsening of diffuse pulmonary edema since the prior chest x-ray which is slightly worse in the right lung compared to the left. Given asymmetry, there may be a component of right lower lung pneumonia as well. No significant pleural fluid is identified. The heart size is stable and normal.  IMPRESSION: Significant worsening of bilateral pulmonary edema. Endotracheal tube tip is approximately 3.8 cm above the carina. Component of right lower lung pneumonia cannot be excluded.   Electronically Signed   By: Aletta Edouard M.D.   On: 06/21/2014 20:09    ASSESSMENT / PLAN:  PULMONARY OETT 2/24 A: Acute hypoxic respiratory failure requiring  intubation CAP Presumed COPD with exacerbation - no documented hx or PFT's.  CT chest with evidence of emphysema Tobacco use disorder P:   Full mechanical support, wean as able. VAP bundle. SBT in AM if able. DuoNebs / Albuterol. Solumedrol. ABG and CXR in AM. Tobacco cessation. Set up f/u with [facility] on discharge for PFT's and management of presumed COPD.  CARDIOVASCULAR CVL L IJ 2/24 >>> A:  Septic Shock due to CAP P:  Place CVL. Levophed PRN. Goal MAP > 65.  RENAL A:   Hyponatremia P:   NS @ 75. BMP in AM.  GASTROINTESTINAL A:   GERD  Nutrition Hx diverticulosis, internal hemorrhoids P:   Pantoprazole. NPO. TF if remains NPO > 24 hours.  HEMATOLOGIC A:   VTE Prophylaxis P:  SCD's / Lovenox. CBC in AM.  INFECTIOUS A:   Septic shock likely due to CAP P:   BCx2 2/24 > UCx 2/24 > Sputum Cx 2/24 > RVP 2/24 > Abx: Ceftriaxone, start date 2/24, day 1/x. Abx: Azithromycin, start date 2/24, day 1/x.  ENDOCRINE A:   No known issues  P:   SSI if glucose consistently > 180.  NEUROLOGIC A:   Acute metabolic encephalopathy Hx fibromyalgia, cluster headaches, back pain P:   Sedation:  Propofol gtt / Fentanyl gtt. RASS goal: 0 to -1. Daily WUA.   Family updated: Mother and Father.  Interdisciplinary Family Meeting v Palliative Care Meeting:  Due by: 3/1.   [name], [facility] Pager: [phone]  or [phone] 06/21/2014, 8:06 PM

## 2014-06-21 NOTE — ED Provider Notes (Signed)
CSN: [id number]     Arrival date & time 06/21/14  1457 History   First MD Initiated Contact with Patient 06/21/14 1542     Chief Complaint  Patient presents with  . Shortness of Breath  . Cough    (Consider location/radiation/quality/duration/timing/severity/associated sxs/prior Treatment) HPI Comments: Patient with past history of smoking, no history of asthma, COPD, or congestive heart failure -- presents with complaint of shortness of breath and cough for the past 2 days. Patient started with symptoms of fatigue and cough which became worse. Patient began feeling short of breath yesterday. She has had fever and chills. No other URI symptoms. Shortness of breath is described as an inability to take a deep breath although she does not have any significant chest pain. She denies hemoptysis. Patient is on a estrogen cream that she uses approximately once a month. No oral estrogens. No history of blood clots. No recent immobilizations or surgeries. No calf tenderness or swelling. No history of cancer. Patient has a decreased appetite however no nausea, vomiting, diarrhea or urinary symptoms. No abdominal pain. No treatments prior to arrival.  The history is provided by the patient.    Past Medical History  Diagnosis Date  . Chronic sinusitis   . Arthritis   . Fibromyalgia   . GERD (gastroesophageal reflux disease)   . Irritable bowel syndrome (IBS)   . Headaches, cluster   . Back pain   . Fatigue   . Muscle pain   . Night sweats   . Continuous urine leakage   . Diverticulosis   . Adenomatous colon polyp   . Internal hemorrhoids   . Hiatal hernia    Past Surgical History  Procedure Laterality Date  . Thoracic outlet surgery    . Tubal ligation    . Vulva surgery    . Neuroplasty / transposition median nerve at carpal tunnel bilateral    . Cystostomy w/ bladder dilation      at age 52   Family History  Problem Relation Age of Onset  . Kidney cancer Mother   . Colon polyps  Mother   . Irritable bowel syndrome Mother   . Diabetes Sister   . Liver disease Sister   . Irritable bowel syndrome Daughter   . Diabetes Brother   . Colon cancer Neg Hx    History  Substance Use Topics  . Smoking status: Current Every Day Smoker -- 0.50 packs/day    Types: Cigarettes  . Smokeless tobacco: Never Used  . Alcohol Use: Yes     Comment: rare   OB History    No data available     Review of Systems  Constitutional: Negative for fever.  HENT: Negative for rhinorrhea and sore throat.   Eyes: Negative for redness.  Respiratory: Positive for cough and shortness of breath. Negative for wheezing.   Cardiovascular: Negative for chest pain, palpitations and leg swelling.  Gastrointestinal: Negative for nausea, vomiting, abdominal pain and diarrhea.  Genitourinary: Negative for dysuria.  Musculoskeletal: Negative for myalgias.  Skin: Negative for rash.  Neurological: Negative for headaches.    Allergies  Morphine and related  Home Medications   Prior to Admission medications   Medication Sig Start Date End Date Taking? Authorizing Provider  aspirin 81 MG tablet Take 162 mg by mouth at bedtime.    Yes Historical Provider, MD  ibuprofen (ADVIL,MOTRIN) 200 MG tablet Take 400 mg by mouth every 6 (six) hours as needed (pain).    Yes Historical Provider, MD  lactulose (CHRONULAC) 10 GM/15ML solution Take 15-30 ml (1-2 tablespoons) daily 05/25/14  Yes Jerene Bears, MD  pseudoephedrine (SUDAFED) 30 MG tablet Take 60 mg by mouth every 6 (six) hours as needed for congestion (congestion).    Yes Historical Provider, MD  ranitidine (ZANTAC) 150 MG tablet TAKE 1 TABLET BY MOUTH EVERY DAY 05/25/14  Yes Jerene Bears, MD  sodium chloride (OCEAN) 0.65 % nasal spray Place 2 sprays into the nose daily as needed for congestion (congestion).    Yes Historical Provider, MD   BP 150/69 mmHg  Pulse 109  Temp(Src) 100.5 F (38.1 C) (Oral)  Resp 22  SpO2 92%   Physical Exam   Constitutional: She appears well-developed and well-nourished.  HENT:  Head: Normocephalic and atraumatic.  Eyes: Conjunctivae are normal. Right eye exhibits no discharge. Left eye exhibits no discharge.  Neck: Normal range of motion. Neck supple.  Cardiovascular: Normal rate, regular rhythm and normal heart sounds.   Pulmonary/Chest: Breath sounds normal. Accessory muscle usage present. Tachypnea noted. She has no decreased breath sounds. She has no wheezes. She has no rhonchi. She has no rales.  Abdominal: Soft. There is no tenderness.  Musculoskeletal: She exhibits no edema or tenderness.  Neurological: She is alert.  Skin: Skin is warm and dry.  Psychiatric: She has a normal mood and affect.  Nursing note and vitals reviewed.   ED Course  Procedures (including critical care time) Labs Review Labs Reviewed  COMPREHENSIVE METABOLIC PANEL - Abnormal; Notable for the following:    Sodium 128 (*)    Chloride 92 (*)    Glucose, Bld 116 (*)    AST 39 (*)    All other components within normal limits  CBC - Abnormal; Notable for the following:    Hemoglobin 15.1 (*)    All other components within normal limits  BRAIN NATRIURETIC PEPTIDE - Abnormal; Notable for the following:    B Natriuretic Peptide 277.1 (*)    All other components within normal limits  D-DIMER, QUANTITATIVE - Abnormal; Notable for the following:    D-Dimer, Quant 0.75 (*)    All other components within normal limits  BLOOD GAS, ARTERIAL - Abnormal; Notable for the following:    pH, Arterial 7.105 (*)    pCO2 arterial 77.8 (*)    pO2, Arterial 162.0 (*)    Acid-base deficit 8.4 (*)    All other components within normal limits  CULTURE, BLOOD (ROUTINE X 2)  CULTURE, BLOOD (ROUTINE X 2)  INFLUENZA PANEL BY PCR (TYPE A & B, H1N1)  I-STAT CG4 LACTIC ACID, ED  Randolm Idol, ED    Imaging Review Dg Chest 2 View  06/21/2014   CLINICAL DATA:  Two day history of cough and difficulty breathing. Fever  EXAM:  CHEST  2 VIEW  COMPARISON:  None.  FINDINGS: There is a nipple shadow on the left. The interstitium is mildly prominent in the lower lung zones, slightly greater on the right than on the left, probably reflecting chronic inflammatory type change. There is no frank edema or consolidation. Heart size and pulmonary vascularity are normal. No adenopathy. No bone lesions. There are surgical clips in the left axillary region.  IMPRESSION: Mild interstitial prominence in the lower lobes, probably reflecting chronic inflammatory type change. No frank edema or consolidation.   Electronically Signed   By: Lowella Grip III M.D.   On: 06/21/2014 15:58   Ct Angio Chest Pe W/cm &/or Wo Cm  06/21/2014   CLINICAL  DATA:  54 year old female with shortness of breath and cough since Monday. Neck and back pain. Initial encounter. Hypoxia  EXAM: CT ANGIOGRAPHY CHEST WITH CONTRAST  TECHNIQUE: Multidetector CT imaging of the chest was performed using the standard protocol during bolus administration of intravenous contrast. Multiplanar CT image reconstructions and MIPs were obtained to evaluate the vascular anatomy.  CONTRAST:  172mL OMNIPAQUE IOHEXOL 350 MG/ML SOLN  COMPARISON:  Chest radiographs 1540 hours today. CT Abdomen and Pelvis 07/23/2011.  FINDINGS: Good contrast bolus timing in the pulmonary arterial tree. Respiratory motion artifact in the lower lobes. No focal filling defect identified in the pulmonary arterial tree to suggest the presence of acute pulmonary embolism.  Compared to the lung bases on 07/23/2011, there is diffuse pulmonary septal thickening. There is also vague and nodular peribronchovascular in the lungs bilaterally, slightly greater on the right and maximal in the lower lobe. There is superimposed central lobular upper lobe predominant emphysema. Small right side Bochdalek hernia.  No pericardial or pleural effusion. Mildly enlarged hilar lymph nodes on the right. Indistinct increased mediastinal soft  tissue about the carina measuring up to 10 mm short axis. Aberrant right subclavian artery origin. Increased pretracheal soft tissue measuring up to 9 mm short axis at the level of the great vessels. Soft and calcified plaque in the descending thoracic aorta and continuing into the abdominal aorta.  Negative visualized liver, spleen, adrenal glands, kidneys, and bowel in the upper abdomen. No axillary lymphadenopathy.  No acute osseous abnormality identified.  Review of the MIP images confirms the above findings.  IMPRESSION: 1.  No evidence of acute pulmonary embolus. 2. Abnormal lungs with a combination of septal thickening and nodular peribronchovascular opacity. Underlying emphysema. No pleural fluid. Favor bilateral pneumonia over acute pulmonary edema. 3. Increased right hilar and mediastinal soft tissue favored to be reactive lymphadenopathy. 4. Aortic atherosclerosis.  Aberrant right subclavian artery.   Electronically Signed   By: Genevie Ann M.D.   On: 06/21/2014 17:23     EKG Interpretation   Date/Time:  Wednesday June 21 2014 15:04:56 EST Ventricular Rate:  106 PR Interval:  163 QRS Duration: 90 QT Interval:  351 QTC Calculation: 466 R Axis:   74 Text Interpretation:  Sinus tachycardia Probable left atrial enlargement  RSR' in V1 or V2, probably normal variant Anteroseptal infarct, old No  significant change since last tracing Confirmed by [facility]  MD, [place]  [phone]) on 06/21/2014 7:14:06 PM      4:20 PM Patient seen and examined. Work-up initiated. Medications ordered.   Vital signs reviewed and are as follows: BP 150/69 mmHg  Pulse 109  Temp(Src) 100.5 F (38.1 C) (Oral)  Resp 22  SpO2 92%   5:03 PM Discussed with Dr. Mingo Amber who has seen. CT angio chest ordered 2/2 elevated d-dimer.   5:54 PM CT reviewed. Patient feels more SOB over the past 30 minutes. Additional fluids ordered to bring her to 30cc/kg in ED. Ordered blood cultures and IV rocephin and azithromycin. Again no  CP.  6:32 PM [facility] to see. Spoke with Dr. Mingo Amber and asked to reassess. ABG ordered.   7:12 PM ABG as above. Patient intubated by Dr. Mingo Amber. Will admit to critical care.   7:24 PM Spoke with Dr. Nelda Marseille who will see.   BP 166/81 mmHg  Pulse 109  Temp(Src) 98.1 F (36.7 C) (Oral)  Resp 20  SpO2 91%   MDM   Final diagnoses:  Community acquired pneumonia  Shortness of breath  Acute respiratory  failure with hypercapnia   Admit to ICU.    Carlisle Cater, PA-C 06/21/14 1927  Evelina Bucy, MD 06/21/14 [phone]

## 2014-06-21 NOTE — ED Notes (Signed)
Bed: WA19 Expected date:  Expected time:  Means of arrival:  Comments: Tri 2 

## 2014-06-21 NOTE — ED Notes (Signed)
Propofol started at 1949 at 5 mcg/min

## 2014-06-21 NOTE — Progress Notes (Signed)
ANTIBIOTIC CONSULT NOTE - INITIAL  Pharmacy Consult for ceftriaxone and azithromycin Indication: pneumonia  Allergies  Allergen Reactions  . Morphine And Related     Altered mental state    Patient Measurements: Wt= 46.8kg on 05/25/14 IBW=45.5kg  Vital Signs: Temp: 98.1 F (36.7 C) (02/24 1746) Temp Source: Oral (02/24 1746) BP: 166/81 mmHg (02/24 1746) Pulse Rate: 109 (02/24 1746)  Labs:  Recent Labs  06/21/14 1515  WBC 7.8  HGB 15.1*  PLT 208  CREATININE 0.63   Medical History: Past Medical History  Diagnosis Date  . Chronic sinusitis   . Arthritis   . Fibromyalgia   . GERD (gastroesophageal reflux disease)   . Irritable bowel syndrome (IBS)   . Headaches, cluster   . Back pain   . Fatigue   . Muscle pain   . Night sweats   . Continuous urine leakage   . Diverticulosis   . Adenomatous colon polyp   . Internal hemorrhoids   . Hiatal hernia     Medications:  Scheduled:  . enoxaparin (LOVENOX) injection  40 mg Subcutaneous Q24H  . etomidate      . etomidate  12 mg Intravenous Once  . fentaNYL  50 mcg Intravenous Once  . ipratropium-albuterol  3 mL Nebulization Q4H  . lidocaine (cardiac) 100 mg/23ml      . methylPREDNISolone (SOLU-MEDROL) injection  80 mg Intravenous Q6H  . pantoprazole (PROTONIX) IV  40 mg Intravenous QHS  . rocuronium      . succinylcholine      . succinylcholine  70 mg Intravenous Once   Infusions:  . sodium chloride    . [START ON 06/22/2014] azithromycin    . [START ON 06/22/2014] cefTRIAXone (ROCEPHIN)  IV    . dextrose 5 % and 0.45% NaCl    . fentaNYL infusion INTRAVENOUS    . propofol 234 mcg/min (06/21/14 1923)  . propofol     Assessment: 66 yoF admitted 2/24 with ShOB and cough x 2 days PTA. CT angio performed on admission shows bilateral infiltrates. Pt noted with agonal breathing and to be acidotic in the ED, was intubated, to be admitted to the ICU. Pharmacy is consulted to dose ceftriaxone and azithromycin for  pneumonia.  Antiinfectives  2/24 >> ceftriaxone >> 2/24 >> azithromycin >>    Labs / vitals Tmax: 100.5 WBCs: WNL Renal: SCr 0.63, CrCl 60 ml/min CG, 92N  Microbiology 2/24 blood x2: IP 2/24 flu panel: IP 2/24 resp Cx: ordered 2/24 resp virus panel: ordered  Goal of Therapy:  ceftriaxone and azithromycin per indication  Plan:  - ceftriaxone 1g V q24h x 8 days total - azithromycin 500mg  IV q24h x 8 days total - no dose adjustments anticipated -Pharmacy will sign-off, please reconsult if needed  Thank you for the consult.  Currie Paris, PharmD, BCPS Pager: [phone] Pharmacy: [phone] 06/21/2014 8:01 PM

## 2014-06-21 NOTE — ED Notes (Signed)
Pt c/o SOB and cough that is shallow. Pt NAD. Pt states she has been like this since Monday. Pt c/o pain in neck and back.

## 2014-06-21 NOTE — ED Notes (Signed)
White pair of earrings and black watch given to family

## 2014-06-21 NOTE — ED Provider Notes (Signed)
Medical screening examination/treatment/procedure(s) were conducted as a shared visit with non-physician practitioner(s) and myself.  I personally evaluated the patient during the encounter.   EKG Interpretation   Date/Time:  Wednesday June 21 2014 15:04:56 EST Ventricular Rate:  106 PR Interval:  163 QRS Duration: 90 QT Interval:  351 QTC Calculation: 466 R Axis:   74 Text Interpretation:  Sinus tachycardia Probable left atrial enlargement  RSR' in V1 or V2, probably normal variant Anteroseptal infarct, old No  significant change since last tracing Confirmed by Mingo Amber  MD, [place]  [phone]) on 06/21/2014 7:14:06 PM      Patient here with SOB. Increasing oxygen requirements. CXR clear. D-dimer elevated, CT angio shows bilateral pneumonia. On re-exam, mottled in her back. Worsening respiratory status, but maintaining sats on NRB. Patient became acutely short of breath and I was notified by nursing. On exam she was agonal, maintaining her sats. We decided to intubate. Intubated with bougie and 6.5 tube. Good view, however I couldn't pass my initial tube which was a 7.5 Fr.  Plan for ICU admission.  Propofol drip used for sedation.  CRITICAL CARE Performed by: Osvaldo Shipper   Total critical care time: 30 minutes  Critical care time was exclusive of separately billable procedures and treating other patients.  Critical care was necessary to treat or prevent imminent or life-threatening deterioration.  Critical care was time spent personally by me on the following activities: development of treatment plan with patient and/or surrogate as well as nursing, discussions with consultants, evaluation of patient's response to treatment, examination of patient, obtaining history from patient or surrogate, ordering and performing treatments and interventions, ordering and review of laboratory studies, ordering and review of radiographic studies, pulse oximetry and re-evaluation of  patient's condition.  INTUBATION Date/Time: 06/21/2014 7:16 PM Performed by: Evelina Bucy Authorized by: Evelina Bucy Consent: The procedure was performed in an emergent situation. Time out: Immediately prior to procedure a "time out" was called to verify the correct patient, procedure, equipment, support staff and site/side marked as required. Indications: respiratory distress and  respiratory failure Intubation method: direct Patient status: paralyzed (RSI) Preoxygenation: nonrebreather mask and BVM Sedatives: etomidate Paralytic: succinylcholine Laryngoscope size: Mac 3 Tube size: 6.5 mm Number of attempts: 2 (initial attempt with 7.5 tube, unable to pass, bougie used to enter airway, 6.5 tube slid over bougie with direct visualization) Cricoid pressure: yes Cords visualized: yes Post-procedure assessment: ETCO2 monitor and chest rise Breath sounds: equal ETT to teeth: 22 cm Tube secured with: ETT holder Chest x-ray interpreted by me. Chest x-ray findings: endotracheal tube in appropriate position Patient tolerance: Patient tolerated the procedure well with no immediate complications     Evelina Bucy, MD 06/21/14 2324

## 2014-06-22 ENCOUNTER — Inpatient Hospital Stay (HOSPITAL_COMMUNITY): Payer: Medicare Other

## 2014-06-22 LAB — CBC
HCT: 38 % (ref 36.0–46.0)
Hemoglobin: 12.4 g/dL (ref 12.0–15.0)
MCH: 30.1 pg (ref 26.0–34.0)
MCHC: 32.6 g/dL (ref 30.0–36.0)
MCV: 92.2 fL (ref 78.0–100.0)
Platelets: 209 10*3/uL (ref 150–400)
RBC: 4.12 MIL/uL (ref 3.87–5.11)
RDW: 14.7 % (ref 11.5–15.5)
WBC: 15.6 10*3/uL — AB (ref 4.0–10.5)

## 2014-06-22 LAB — INFLUENZA PANEL BY PCR (TYPE A & B)
H1N1 flu by pcr: DETECTED — AB
INFLAPCR: POSITIVE — AB
INFLBPCR: NEGATIVE

## 2014-06-22 LAB — BLOOD GAS, ARTERIAL
Acid-base deficit: 7.3 mmol/L — ABNORMAL HIGH (ref 0.0–2.0)
Bicarbonate: 19.2 mEq/L — ABNORMAL LOW (ref 20.0–24.0)
FIO2: 0.3 %
MECHVT: 450 mL
O2 SAT: 90.2 %
PATIENT TEMPERATURE: 99.9
PEEP/CPAP: 5 cmH2O
RATE: 14 resp/min
TCO2: 17.8 mmol/L (ref 0–100)
pCO2 arterial: 46.1 mmHg — ABNORMAL HIGH (ref 35.0–45.0)
pH, Arterial: 7.248 — ABNORMAL LOW (ref 7.350–7.450)
pO2, Arterial: 69.3 mmHg — ABNORMAL LOW (ref 80.0–100.0)

## 2014-06-22 LAB — STREP PNEUMONIAE URINARY ANTIGEN: STREP PNEUMO URINARY ANTIGEN: NEGATIVE

## 2014-06-22 LAB — GLUCOSE, CAPILLARY
GLUCOSE-CAPILLARY: 166 mg/dL — AB (ref 70–99)
GLUCOSE-CAPILLARY: 173 mg/dL — AB (ref 70–99)
GLUCOSE-CAPILLARY: 207 mg/dL — AB (ref 70–99)
Glucose-Capillary: 179 mg/dL — ABNORMAL HIGH (ref 70–99)
Glucose-Capillary: 170 mg/dL — ABNORMAL HIGH (ref 70–99)
Glucose-Capillary: 165 mg/dL — ABNORMAL HIGH (ref 70–99)

## 2014-06-22 LAB — MAGNESIUM: Magnesium: 1.7 mg/dL (ref 1.5–2.5)

## 2014-06-22 LAB — BASIC METABOLIC PANEL
Anion gap: 0 — ABNORMAL LOW (ref 5–15)
BUN: 8 mg/dL (ref 6–23)
CALCIUM: 7.5 mg/dL — AB (ref 8.4–10.5)
CO2: 20 mmol/L (ref 19–32)
Chloride: 109 mmol/L (ref 96–112)
Creatinine, Ser: 0.42 mg/dL — ABNORMAL LOW (ref 0.50–1.10)
GFR calc Af Amer: >90 mL/min (ref >90)
GLUCOSE: 175 mg/dL — AB (ref 70–99)
Potassium: 4 mmol/L (ref 3.5–5.1)
SODIUM: 129 mmol/L — AB (ref 135–145)

## 2014-06-22 LAB — MRSA PCR SCREENING: MRSA by PCR: NEGATIVE

## 2014-06-22 LAB — PROCALCITONIN: Procalcitonin: 0.29 ng/mL

## 2014-06-22 LAB — LACTIC ACID, PLASMA: Lactic Acid, Venous: 1.6 mmol/L (ref 0.5–2.0)

## 2014-06-22 LAB — PHOSPHORUS: Phosphorus: 2.6 mg/dL (ref 2.3–4.6)

## 2014-06-22 LAB — BRAIN NATRIURETIC PEPTIDE: B Natriuretic Peptide: 265 pg/mL — ABNORMAL HIGH (ref 0.0–100.0)

## 2014-06-22 LAB — TROPONIN I
Troponin I: 0.06 ng/mL — ABNORMAL HIGH (ref <0.031)
Troponin I: 0.05 ng/mL — ABNORMAL HIGH (ref <0.031)

## 2014-06-22 LAB — TRIGLYCERIDES: Triglycerides: 86 mg/dL (ref <150)

## 2014-06-22 MED ORDER — CETYLPYRIDINIUM CHLORIDE 0.05 % MT LIQD
7.0000 mL | Freq: Four times a day (QID) | OROMUCOSAL | Status: DC
  Administered 2014-06-22 – 2014-06-28 (×24): 7 mL via OROMUCOSAL

## 2014-06-22 MED ORDER — VITAL HIGH PROTEIN PO LIQD
1000.0000 mL | ORAL | Status: DC
  Administered 2014-06-22: 1000 mL
  Administered 2014-06-22: 22:00:00
  Administered 2014-06-22: 1000 mL
  Administered 2014-06-22: 19:00:00
  Filled 2014-06-22 (×2): qty 1000

## 2014-06-22 MED ORDER — INSULIN ASPART 100 UNIT/ML ~~LOC~~ SOLN
0.0000 [IU] | Freq: Three times a day (TID) | SUBCUTANEOUS | Status: DC
  Administered 2014-06-23: 2 [IU] via SUBCUTANEOUS
  Administered 2014-06-23: 5 [IU] via SUBCUTANEOUS
  Administered 2014-06-23: 3 [IU] via SUBCUTANEOUS
  Administered 2014-06-24: 5 [IU] via SUBCUTANEOUS

## 2014-06-22 MED ORDER — FENTANYL CITRATE 0.05 MG/ML IJ SOLN
50.0000 ug | INTRAMUSCULAR | Status: DC | PRN
Start: 2014-06-22 — End: 2014-06-28
  Administered 2014-06-22 (×2): 50 ug via INTRAVENOUS
  Administered 2014-06-22: 75 ug via INTRAVENOUS
  Administered 2014-06-22: 50 ug via INTRAVENOUS
  Administered 2014-06-22 (×2): 75 ug via INTRAVENOUS
  Administered 2014-06-23 (×4): 50 ug via INTRAVENOUS
  Administered 2014-06-23: 100 ug via INTRAVENOUS
  Administered 2014-06-23 – 2014-06-24 (×5): 50 ug via INTRAVENOUS
  Administered 2014-06-27 – 2014-06-28 (×6): 100 ug via INTRAVENOUS
  Filled 2014-06-22 (×19): qty 2

## 2014-06-22 MED ORDER — PROPOFOL 10 MG/ML IV EMUL
5.0000 ug/kg/min | INTRAVENOUS | Status: DC
  Administered 2014-06-22 (×2): 25 ug/kg/min via INTRAVENOUS
  Administered 2014-06-23: 35 ug/kg/min via INTRAVENOUS
  Administered 2014-06-23: 20 ug/kg/min via INTRAVENOUS
  Filled 2014-06-22 (×3): qty 100

## 2014-06-22 MED ORDER — OSELTAMIVIR PHOSPHATE 75 MG PO CAPS
75.0000 mg | ORAL_CAPSULE | Freq: Two times a day (BID) | ORAL | Status: DC
  Administered 2014-06-22: 75 mg via ORAL
  Filled 2014-06-22 (×2): qty 1

## 2014-06-22 MED ORDER — IPRATROPIUM-ALBUTEROL 0.5-2.5 (3) MG/3ML IN SOLN
3.0000 mL | RESPIRATORY_TRACT | Status: DC
  Administered 2014-06-22 – 2014-07-04 (×70): 3 mL via RESPIRATORY_TRACT
  Filled 2014-06-22 (×71): qty 3

## 2014-06-22 MED ORDER — NICOTINE 21 MG/24HR TD PT24
21.0000 mg | MEDICATED_PATCH | Freq: Every day | TRANSDERMAL | Status: DC
  Administered 2014-06-22 – 2014-07-04 (×13): 21 mg via TRANSDERMAL
  Filled 2014-06-22 (×13): qty 1

## 2014-06-22 MED ORDER — MAGNESIUM SULFATE 2 GM/50ML IV SOLN
2.0000 g | Freq: Once | INTRAVENOUS | Status: AC
  Administered 2014-06-22: 2 g via INTRAVENOUS
  Filled 2014-06-22: qty 50

## 2014-06-22 MED ORDER — INSULIN ASPART 100 UNIT/ML ~~LOC~~ SOLN
0.0000 [IU] | Freq: Every day | SUBCUTANEOUS | Status: DC
  Administered 2014-06-22: 2 [IU] via SUBCUTANEOUS

## 2014-06-22 MED ORDER — OSELTAMIVIR PHOSPHATE 6 MG/ML PO SUSR
75.0000 mg | Freq: Two times a day (BID) | ORAL | Status: AC
  Administered 2014-06-22 – 2014-06-26 (×9): 75 mg via ORAL
  Filled 2014-06-22 (×16): qty 12.5

## 2014-06-22 MED ORDER — CHLORHEXIDINE GLUCONATE 0.12 % MT SOLN
15.0000 mL | Freq: Two times a day (BID) | OROMUCOSAL | Status: DC
  Administered 2014-06-22 – 2014-07-05 (×28): 15 mL via OROMUCOSAL
  Filled 2014-06-22 (×27): qty 15

## 2014-06-22 MED ORDER — NOREPINEPHRINE BITARTRATE 1 MG/ML IV SOLN
0.0000 ug/min | INTRAVENOUS | Status: DC
  Administered 2014-06-22: 10 ug/min via INTRAVENOUS
  Administered 2014-06-23: 3 ug/min via INTRAVENOUS
  Administered 2014-06-24: 6 ug/min via INTRAVENOUS
  Administered 2014-06-25: 5 ug/min via INTRAVENOUS
  Filled 2014-06-22 (×3): qty 16

## 2014-06-22 NOTE — Progress Notes (Signed)
INITIAL NUTRITION ASSESSMENT  DOCUMENTATION CODES Per approved criteria  -Not Applicable   INTERVENTION: -Initiate Vital High Protein via OGT at 20 ml/hr increasing by 10 ml every 4 hours until at goal rate of 40 ml/hr providing 960 kcal, 84 (100% of protein needs) g protein  and 802 ml free water -Current TF regimen with propofol provides a total of 1158 (100% of kcal needs) kcal daily  NUTRITION DIAGNOSIS: Inadequate oral intake related to inability to eat as evidenced by NPO status.   Goal: Pt to meet >/= 90% of estimated needs  Monitor:  TF tolerance, weight trends, labs  Reason for Assessment: Consult per MD/ TF intiation  54 y.o. female  Admitting Dx: <principal problem not specified>  ASSESSMENT: Pt admitted with SOB and cough x 3 days.  Pt is a smoker, diagnosed with COPD.  PMH significant for GERD, fibromyalgia, IBS, headaches, internal hemorroids and hiatal hernia.    Pt currently intubated on ventilator support: MVe: Temp (24hrs), Avg:99.6 F (37.6 C), Min:98.1 F (36.7 C), Max:100.5 F (38.1 C)   Propfol: 7.5  Providing 198 kcal/daily  Pt appears well nourished, per wt records body weight has been steady.   Height: Ht Readings from Last 1 Encounters:  06/21/14 5\' 4"  (1.626 m)    Weight: Wt Readings from Last 1 Encounters:  06/22/14 111 lb (50.349 kg)    Ideal Body Weight: 120 lbs  % Ideal Body Weight: 92%  Wt Readings from Last 10 Encounters:  06/22/14 111 lb (50.349 kg)  05/25/14 103 lb 4 oz (46.834 kg)  07/21/11 110 lb 2 oz (49.952 kg)  03/10/11 108 lb 6.4 oz (49.17 kg)  02/13/11 109 lb (49.442 kg)  01/21/11 109 lb (49.442 kg)    Usual Body Weight: n/a  % Usual Body Weight: n/a  BMI:  Body mass index is 19.04 kg/(m^2).  Estimated Nutritional Needs: Kcal: 1158 kcals Protein: 65-80 g protein Fluid: >/= 1.5 L/day  Skin: Pink, mottled, intact  Diet Order: Diet NPO time specified  EDUCATION NEEDS: -No education needs identified  at this time   Intake/Output Summary (Last 24 hours) at 06/22/14 1012 Last data filed at 06/22/14 0900  Gross per 24 hour  Intake 2047.68 ml  Output   1175 ml  Net 872.68 ml    Last BM: PTA   Labs:   Recent Labs Lab 06/21/14 1515 06/22/14 0435  NA 128* 129*  K 3.9 4.0  CL 92* 109  CO2 25 20  BUN 10 8  CREATININE 0.63 0.42*  CALCIUM 8.7 7.5*  MG  --  1.7  PHOS  --  2.6  GLUCOSE 116* 175*    CBG (last 3)   Recent Labs  06/22/14 0032 06/22/14 0404 06/22/14 0800  GLUCAP 170* 166* 179*    Scheduled Meds: . antiseptic oral rinse  7 mL Mouth Rinse QID  . azithromycin  500 mg Intravenous Q24H  . cefTRIAXone (ROCEPHIN)  IV  1 g Intravenous Q24H  . chlorhexidine  15 mL Mouth Rinse BID  . enoxaparin (LOVENOX) injection  40 mg Subcutaneous Q24H  . etomidate  12 mg Intravenous Once  . ipratropium-albuterol  3 mL Nebulization Q4H  . magnesium sulfate 1 - 4 g bolus IVPB  2 g Intravenous Once  . methylPREDNISolone (SOLU-MEDROL) injection  80 mg Intravenous Q6H  . nicotine  21 mg Transdermal Daily  . oseltamivir  75 mg Oral BID  . pantoprazole (PROTONIX) IV  40 mg Intravenous QHS    Continuous Infusions: .  sodium chloride 75 mL/hr at 06/22/14 0900  . norepinephrine (LEVOPHED) Adult infusion 10.027 mcg/min (06/22/14 0900)  . propofol 1,900 mcg/min (06/22/14 0645)  . propofol 20 mcg/kg/min (06/22/14 0900)    Past Medical History  Diagnosis Date  . Chronic sinusitis   . Arthritis   . Fibromyalgia   . GERD (gastroesophageal reflux disease)   . Irritable bowel syndrome (IBS)   . Headaches, cluster   . Back pain   . Fatigue   . Muscle pain   . Night sweats   . Continuous urine leakage   . Diverticulosis   . Adenomatous colon polyp   . Internal hemorrhoids   . Hiatal hernia     Past Surgical History  Procedure Laterality Date  . Thoracic outlet surgery    . Tubal ligation    . Vulva surgery    . Neuroplasty / transposition median nerve at carpal tunnel  bilateral    . Cystostomy w/ bladder dilation      at age 52    [place] MS Dietetic Intern Pager Number [phone]

## 2014-06-22 NOTE — Progress Notes (Signed)
eLink Physician-Brief Progress Note Patient Name: AUSTYNN PRIDMORE DOB: 1961-01-11 MRN: [id number]   Date of Service  06/22/2014  HPI/Events of Note  cbgs up on continuous tube feeding  eICU Interventions  ssi ordered     Intervention Category Major Interventions: Hyperglycemia - active titration of insulin therapy  Christinia Gully 06/22/2014, 8:20 PM

## 2014-06-22 NOTE — Progress Notes (Signed)
Per patients daughter she is allergic to Albuterol. The symptoms are unknown. Information given to Dr. Alva Garnet with no changes in orders.

## 2014-06-22 NOTE — Progress Notes (Signed)
Pt transported to the ICU room 1237 with help of RN and NT.  Pt placed on 100% FiO2 for duration of the trip.  Transport was uneventful, Pt remained stable throughout the trip.  Report given to ICU Respiratory Therapist.

## 2014-06-22 NOTE — H&P (Addendum)
PULMONARY / CRITICAL CARE MEDICINE   Name: Elaine Le MRN: [id number] DOB: 12/09/1960    ADMISSION DATE:  06/21/2014 CONSULTATION DATE:  06/22/2014  REFERRING MD :  EDP  CHIEF COMPLAINT:  SOB  INITIAL PRESENTATION:   Elaine Le is a 54 y.o. F with PMH as outlined below  She was brought to [facility] ED [facility]/24 with SOB and cough x 3 days.  She is a smoker, close to 1 ppd for unknown # of years.  Her mother and father state that she began to "feel bad" 3 days ago; however, symptoms seemed to have improved somewhat one day prior to ED presentation.  Unfortunately on day of presentation, pt began coughing more and felt more congested. her sister who brought her to ED.  No known fevers/chills/sweats, chest pain, N/V/D, myalgias, LE edema or pain.  No recent long trips or periods of immobilization.  No similar episodes in the past.  Her friend does think she had URI over the past few days. On ED arrival, she was found to be hypoxic and in significant respiratory distress.  ABG revealed significant respiratory acidosis failed bipapp and intubated with 6.5 ET tube. PCCM admitting 06/21/2014    has a past medical history of Chronic sinusitis; Arthritis; Fibromyalgia; GERD (gastroesophageal reflux disease); Irritable bowel syndrome (IBS); Headaches, cluster; Back pain; Fatigue; Muscle pain; Night sweats; Continuous urine leakage; Diverticulosis; Adenomatous colon polyp; Internal hemorrhoids; and Hiatal hernia.    STUDIES:  CTA chest 2/24 >>> no PE, underlying emphysema.  Bilateral PNA, right hilar and mediastinal reactive lymphadenopathy.  SIGNIFICANT EVENTS: 2/24 - admit, intubated   SUBJECTIVE:   06/22/2014; on fent gtt + diprivan gtt. -> WUA with both off in progress again (earlier did well per RN). SBT jus started. Sister and dad at bedside feel patient decompensated y esterday in ER due to albuterol along with facial swelling after albuterol. However, their descripotions more fit in with  with worsening AECOPD. Patient getting albuterol here without problems and this family story is being documented first time   VITAL SIGNS: Temp:  [98.1 F (36.7 C)-100.5 F (38.1 C)] 99.1 F (37.3 C) (02/25 0800) Pulse Rate:  [62-127] 73 (02/25 0800) Resp:  [12-26] 14 (02/25 0800) BP: (71-173)/(42-115) 98/56 mmHg (02/25 0800) SpO2:  [91 %-100 %] 94 % (02/25 0800) FiO2 (%):  [30 %-100 %] 30 % (02/25 0745) Weight:  [50.349 kg (111 lb)] 50.349 kg (111 lb) (02/25 0419) HEMODYNAMICS:   VENTILATOR SETTINGS: Vent Mode:  [-] PRVC FiO2 (%):  [30 %-100 %] 30 % Set Rate:  [14 bmp-20 bmp] 14 bmp Vt Set:  [400 mL-450 mL] 450 mL PEEP:  [5 cmH20] 5 cmH20 Plateau Pressure:  [19 cmH20-27 cmH20] 19 cmH20 INTAKE / OUTPUT: Intake/Output      02/24 0701 - 02/25 0700 02/25 0701 - 02/26 0700   I.V. (mL/kg) 1797.8 (35.7) 121.6 (2.4)   NG/GT  30   Total Intake(mL/kg) 1797.8 (35.7) 151.6 (3)   Urine (mL/kg/hr) 850 325 (2.9)   Total Output 850 325   Net +947.8 -173.4          PHYSICAL EXAMINATION: General: Thin female, in NAD. Neuro: Sedated on vent. RASS -2 with WUA in progress HEENT: /AT. PERRL, sclerae anicteric. Cardiovascular: RRR, no M/R/G.  Lungs: Respirations even and unlabored.  Expiratory wheezes bilaterally. - resolved. Sync with vent Abdomen: BS x 4, soft, NT/ND.  Musculoskeletal: No gross deformities, no edema.  Skin: Intact, warm, no rashes.  LABS:  PULMONARY  Recent Labs Lab 06/21/14 1840 06/21/14 2107 06/22/14 0350  PHART 7.105* 7.092* 7.248*  PCO2ART 77.8* 63.8* 46.1*  PO2ART 162.0* 438.0* 69.3*  HCO3 23.4 18.9* 19.2*  TCO2 22.2 18.5 17.8  O2SAT 97.2 98.4 90.2    CBC  Recent Labs Lab 06/21/14 1515 06/22/14 0435  HGB 15.1* 12.4  HCT 44.1 38.0  WBC 7.8 15.6*  PLT 208 209    COAGULATION No results for input(s): INR in the last 168 hours.  CARDIAC  No results for input(s): TROPONINI in the last 168 hours. No results for input(s): PROBNP in the last  168 hours.   CHEMISTRY  Recent Labs Lab 06/21/14 1515 06/22/14 0435  NA 128* 129*  K 3.9 4.0  CL 92* 109  CO2 25 20  GLUCOSE 116* 175*  BUN 10 8  CREATININE 0.63 0.42*  CALCIUM 8.7 7.5*  MG  --  1.7  PHOS  --  2.6   Estimated Creatinine Clearance: 64.6 mL/min (by C-G formula based on Cr of 0.42).   LIVER  Recent Labs Lab 06/21/14 1515  AST 39*  ALT 17  ALKPHOS 77  BILITOT 0.8  PROT 7.3  ALBUMIN 4.3     INFECTIOUS  Recent Labs Lab 06/21/14 1639  LATICACIDVEN 0.85     ENDOCRINE CBG (last 3)   Recent Labs  06/22/14 0032 06/22/14 0404 06/22/14 0800  GLUCAP 170* 166* 179*         IMAGING x48h Dg Neck Soft Tissue  06/21/2014   CLINICAL DATA:  Neck swelling, increased since central line placement.  EXAM: NECK SOFT TISSUES - 1+ VIEW  COMPARISON:  None.  FINDINGS: NG and endotracheal tubes are in place. Left central venous catheter. Suggestion of increased density in the soft tissues of the left neck. This is nonspecific in could be due to patient positioning, soft tissue attenuation, or hematoma. Clinical correlation recommended.  IMPRESSION: Increased density in the left neck of nonspecific etiology. Soft tissue hematoma versus soft tissue attenuation due to positioning.   Electronically Signed   By: Lucienne Capers M.D.   On: 06/21/2014 21:49   Dg Chest 2 View  06/21/2014   CLINICAL DATA:  Two day history of cough and difficulty breathing. Fever  EXAM: CHEST  2 VIEW  COMPARISON:  None.  FINDINGS: There is a nipple shadow on the left. The interstitium is mildly prominent in the lower lung zones, slightly greater on the right than on the left, probably reflecting chronic inflammatory type change. There is no frank edema or consolidation. Heart size and pulmonary vascularity are normal. No adenopathy. No bone lesions. There are surgical clips in the left axillary region.  IMPRESSION: Mild interstitial prominence in the lower lobes, probably reflecting  chronic inflammatory type change. No frank edema or consolidation.   Electronically Signed   By: Lowella Grip III M.D.   On: 06/21/2014 15:58   Ct Angio Chest Pe W/cm &/or Wo Cm  06/21/2014   CLINICAL DATA:  54 year old female with shortness of breath and cough since Monday. Neck and back pain. Initial encounter. Hypoxia  EXAM: CT ANGIOGRAPHY CHEST WITH CONTRAST  TECHNIQUE: Multidetector CT imaging of the chest was performed using the standard protocol during bolus administration of intravenous contrast. Multiplanar CT image reconstructions and MIPs were obtained to evaluate the vascular anatomy.  CONTRAST:  135mL OMNIPAQUE IOHEXOL 350 MG/ML SOLN  COMPARISON:  Chest radiographs 1540 hours today. CT Abdomen and Pelvis 07/23/2011.  FINDINGS: Good contrast bolus timing in the pulmonary arterial tree.  Respiratory motion artifact in the lower lobes. No focal filling defect identified in the pulmonary arterial tree to suggest the presence of acute pulmonary embolism.  Compared to the lung bases on 07/23/2011, there is diffuse pulmonary septal thickening. There is also vague and nodular peribronchovascular in the lungs bilaterally, slightly greater on the right and maximal in the lower lobe. There is superimposed central lobular upper lobe predominant emphysema. Small right side Bochdalek hernia.  No pericardial or pleural effusion. Mildly enlarged hilar lymph nodes on the right. Indistinct increased mediastinal soft tissue about the carina measuring up to 10 mm short axis. Aberrant right subclavian artery origin. Increased pretracheal soft tissue measuring up to 9 mm short axis at the level of the great vessels. Soft and calcified plaque in the descending thoracic aorta and continuing into the abdominal aorta.  Negative visualized liver, spleen, adrenal glands, kidneys, and bowel in the upper abdomen. No axillary lymphadenopathy.  No acute osseous abnormality identified.  Review of the MIP images confirms the  above findings.  IMPRESSION: 1.  No evidence of acute pulmonary embolus. 2. Abnormal lungs with a combination of septal thickening and nodular peribronchovascular opacity. Underlying emphysema. No pleural fluid. Favor bilateral pneumonia over acute pulmonary edema. 3. Increased right hilar and mediastinal soft tissue favored to be reactive lymphadenopathy. 4. Aortic atherosclerosis.  Aberrant right subclavian artery.   Electronically Signed   By: Genevie Ann M.D.   On: 06/21/2014 17:23   Dg Chest Port 1 View  06/22/2014   CLINICAL DATA:  Intubated patient, acute respiratory failure.  EXAM: PORTABLE CHEST - 1 VIEW  COMPARISON:  Portable chest x-ray of June 21, 2014  FINDINGS: The endotracheal tube tip lies 5.2 cm above the crotch of the carina. The esophagogastric tube tip projects below the GE junction. The left internal jugular venous catheter tip projects over the proximal portion of the SVC. The cardiac silhouette is normal in size. The pulmonary vascularity is not engorged. The interstitial markings of both lungs are increased there is increasing density in the right infrahilar region. There is a small correction there small bilateral pleural effusions.  IMPRESSION: Persistent pulmonary interstitial edema superimposed upon COPD. Right lower lobe atelectasis or pneumonia atelectasis or pneumonia. There are small bilateral pleural effusions. The support tubes and lines are in appropriate position radiographically.   Electronically Signed   By: David  Martinique   On: 06/22/2014 07:29   Dg Chest Port 1 View  06/21/2014   CLINICAL DATA:  Postcentral line placement.  EXAM: PORTABLE CHEST - 1 VIEW  COMPARISON:  06/21/2014  FINDINGS: Endotracheal tube tip measures 5.2 cm above the carinal. Enteric tube tip is off the field of view. Left central venous catheter has been placed since previous study. Tip overlies the low SVC. No pneumothorax. Normal heart size. Increased pulmonary vascularity with perihilar and  interstitial infiltrates likely representing edema. Lung bases are not included on the field of view. Surgical clips in the left chest.  IMPRESSION: Appliances appear in satisfactory location. Persistent pulmonary vascular congestion and bilateral pulmonary infiltrates likely due to edema.   Electronically Signed   By: Lucienne Capers M.D.   On: 06/21/2014 21:44   Dg Chest Portable 1 View  06/21/2014   CLINICAL DATA:  Shortness of breath and respiratory failure. Status post intubation.  EXAM: PORTABLE CHEST - 1 VIEW  COMPARISON:  Chest x-ray at 1540 hours  FINDINGS: Endotracheal tube present with the tip approximately 3.8 cm above the carina. A nasogastric tube has been  placed which extends below the diaphragm lungs show significant worsening of diffuse pulmonary edema since the prior chest x-ray which is slightly worse in the right lung compared to the left. Given asymmetry, there may be a component of right lower lung pneumonia as well. No significant pleural fluid is identified. The heart size is stable and normal.  IMPRESSION: Significant worsening of bilateral pulmonary edema. Endotracheal tube tip is approximately 3.8 cm above the carina. Component of right lower lung pneumonia cannot be excluded.   Electronically Signed   By: Aletta Edouard M.D.   On: 06/21/2014 20:09        ASSESSMENT / PLAN:  PULMONARY OETT 2/24 (size 6.5, tough airway needing bougie >> A: #baseline   Hx of smoking. No known prior pulm dx  #Currently  - Acute hypoxic respiratory failure requiring intubation due to AECOPD (new dx of of copd this admit) and CAP  - Intubated 06/21/14  Bu ER - DIFFICULT AIRWAY needing bougie and size 6.5 et tube   - 06/22/14: WUA in progress. Tolerating albuterol well. Doubt she has true albuterol allergy   P:   SBT 06/22/2014 but doubt extubatable Full mechanical support, wean as able. VAP bundle. SBT in AM if able. DuoNebs / Albuterol. Solumedrol. ABG and CXR in AM. Nicotine  patch Set up f/u with [facility] on discharge for PFT's and management of presumed COPD.  CARDIOVASCULAR CVL L IJ 2/24 >>>  A:  Septic Shock due to CAP v Hypotension due to diprivan  P:  Levophed for goal  MAP > 65. Check cvp x 1 Check PCT profile  RENAL A:   Hyponatremia P:   NS @ 75. BMP in AM.  GASTROINTESTINAL A:   GERD Nutrition Hx diverticulosis, internal hemorrhoids P:   Pantoprazole. NPO. TF to start  HEMATOLOGIC A:   VTE Prophylaxis P:  SCD's / Lovenox. CBC in AM.  INFECTIOUS BCx2 2/24 > UCx 2/24 > Sputum Cx 2/24 > RVP 2/24 > RVP vial trach aspirate 06/22/14 Tracheal aspirate 06/22/14 Urine leg and strep 2/125/16  A:   CAP P:   Abx: Ceftriaxone, start date 2/24, day 1/x. Abx: Azithromycin, start date 2/24, day 1/x. Antiviral Tamiflu 06/22/14>> Check trach aspirate, urine leg, urine strep and resp virus panel via trach aspirate  ENDOCRINE A:   No known issues  P:   SSI if glucose consistently > 180.  NEUROLOGIC A:   #Baseline  - Hx fibromyalgia, cluster headaches, back pain #At admit  - Acute metabolic encephalopathy due to hypercapnia  - 06/22/14: WUA in progress. AT high risk for delirium ICU  P:   Sedation:  Propofol gtt / Fentanyl gtt. RASS goal: 0 to -1. Daily WUA.   Family updated: Sister r and Father. ,td  GLOBAL  - new dx copd with aecopd and cap. Has small ET tube. Doing SBT.  Wont extubate 06/22/2014 . Difficult airway noted. Doubt albuterol allergy   The patient is critically ill with multiple organ systems failure and requires high complexity decision making for assessment and support, frequent evaluation and titration of therapies, application of advanced monitoring technologies and extensive interpretation of multiple databases.   Critical Care Time devoted to patient care services described in this note is  45  Minutes. This time reflects time of care of this signee Dr Brand Males. This critical care  time does not reflect procedure time, or teaching time or supervisory time of PA/NP/Med student/Med Resident etc but could involve care discussion time  Dr. Brand Males, M.D., [facility].C.P Pulmonary and Critical Care Medicine Staff Physician [facility] Pager: [phone], If no answer or between  15:00h - 7:00h: call [phone]  06/22/2014 9:49 AM

## 2014-06-22 NOTE — Progress Notes (Signed)
Pt intubated by MD Mingo Amber.  Pt was hyper-oxygenated before procedure.  Pt had difficult airway.  MD initially tried a 7.5 mmID ETT but the tube would not pass through cords.  MD then used a bougie, Mac 3 and was successful on the second attempt with a 6.5 mmID ETT.  Pt was very asynchronous with the ventilator until sedation improved.  ETT placement verified by end-tidal CO2 color change, bilateral breath sounds, bilateral equal chest rise, and chest x-ray.  ETT secured and placed on documented ventilator settings.

## 2014-06-23 ENCOUNTER — Inpatient Hospital Stay (HOSPITAL_COMMUNITY): Payer: Medicare Other

## 2014-06-23 DIAGNOSIS — J189 Pneumonia, unspecified organism: Secondary | ICD-10-CM | POA: Diagnosis present

## 2014-06-23 DIAGNOSIS — J9600 Acute respiratory failure, unspecified whether with hypoxia or hypercapnia: Secondary | ICD-10-CM

## 2014-06-23 LAB — LEGIONELLA ANTIGEN, URINE

## 2014-06-23 LAB — GLUCOSE, CAPILLARY
Glucose-Capillary: 198 mg/dL — ABNORMAL HIGH (ref 70–99)
Glucose-Capillary: 144 mg/dL — ABNORMAL HIGH (ref 70–99)
Glucose-Capillary: 242 mg/dL — ABNORMAL HIGH (ref 70–99)

## 2014-06-23 LAB — TRIGLYCERIDES: TRIGLYCERIDES: 85 mg/dL (ref <150)

## 2014-06-23 LAB — PHOSPHORUS: Phosphorus: 1.8 mg/dL — ABNORMAL LOW (ref 2.3–4.6)

## 2014-06-23 LAB — TROPONIN I: TROPONIN I: 0.09 ng/mL — AB (ref <0.031)

## 2014-06-23 LAB — MAGNESIUM: MAGNESIUM: 1.9 mg/dL (ref 1.5–2.5)

## 2014-06-23 LAB — PROCALCITONIN: Procalcitonin: 0.25 ng/mL

## 2014-06-23 MED ORDER — PROPOFOL 10 MG/ML IV EMUL
0.0000 ug/kg/min | INTRAVENOUS | Status: DC
  Administered 2014-06-23: 25 ug/kg/min via INTRAVENOUS
  Administered 2014-06-23: 35 ug/kg/min via INTRAVENOUS
  Administered 2014-06-24: 10 ug/kg/min via INTRAVENOUS
  Administered 2014-06-25: 20 ug/kg/min via INTRAVENOUS
  Filled 2014-06-23 (×4): qty 100

## 2014-06-23 MED ORDER — VITAL HIGH PROTEIN PO LIQD
1000.0000 mL | ORAL | Status: DC
  Administered 2014-06-23 – 2014-06-27 (×4): 1000 mL
  Filled 2014-06-23 (×5): qty 1000

## 2014-06-23 MED ORDER — DEXTROSE 5 % IV SOLN
30.0000 mmol | Freq: Once | INTRAVENOUS | Status: AC
  Administered 2014-06-23: 30 mmol via INTRAVENOUS
  Filled 2014-06-23: qty 10

## 2014-06-23 MED ORDER — SODIUM CHLORIDE 0.9 % IV BOLUS (SEPSIS)
1000.0000 mL | Freq: Once | INTRAVENOUS | Status: AC
  Administered 2014-06-23: 1000 mL via INTRAVENOUS

## 2014-06-23 MED ORDER — VITAL AF 1.2 CAL PO LIQD
1000.0000 mL | ORAL | Status: DC
  Filled 2014-06-23: qty 1000

## 2014-06-23 MED ORDER — MAGNESIUM SULFATE 2 GM/50ML IV SOLN
2.0000 g | Freq: Once | INTRAVENOUS | Status: AC
  Administered 2014-06-23: 2 g via INTRAVENOUS
  Filled 2014-06-23: qty 50

## 2014-06-23 MED ORDER — PROPOFOL 10 MG/ML IV EMUL
INTRAVENOUS | Status: AC
  Administered 2014-06-23: 25 ug/kg/min via INTRAVENOUS
  Filled 2014-06-23: qty 100

## 2014-06-23 NOTE — Progress Notes (Signed)
PULMONARY / CRITICAL CARE MEDICINE   Name: Elaine Le MRN: [id number] DOB: 10-Dec-1960    ADMISSION DATE:  06/21/2014 CONSULTATION DATE:  06/23/2014  REFERRING MD :  EDP  CHIEF COMPLAINT:  SOB  INITIAL PRESENTATION:  54 y.o. F, smoker,  admitted 2/24 with a 3 day hx of cough and SOB.  On arrival was found to have significant respiratory distress and hypoxia. Patient failed bipap and was intubated with a #6.5 ETT in ER (REQUIRED BOUGIE).     STUDIES:  CTA chest 2/24 >>> no PE, underlying emphysema.  Bilateral PNA, right hilar and mediastinal reactive lymphadenopathy.  SIGNIFICANT EVENTS: 2/24  Admit, intubated 2/25  Levo @ 10 mcg, sedate on vent.  Family concerned she has an 'allergy to albuterol'.   2/26  Weaned to 4 mcg levo, awake on vent, no acute events overnight   SUBJECTIVE:  RN reports levo requirements decreasing, pt weaning on PSV 14/5.      VITAL SIGNS: Temp:  [98.8 F (37.1 C)-100.4 F (38 C)] 99.7 F (37.6 C) (02/26 0930) Pulse Rate:  [63-108] 88 (02/26 0930) Resp:  [14-22] 20 (02/26 0930) BP: (76-145)/(37-84) 107/65 mmHg (02/26 0930) SpO2:  [93 %-100 %] 100 % (02/26 0930) FiO2 (%):  [30 %] 30 % (02/26 0848) Weight:  [115 lb 11.2 oz (52.481 kg)] 115 lb 11.2 oz (52.481 kg) (02/26 0420)   HEMODYNAMICS:     VENTILATOR SETTINGS: Vent Mode:  [-] CPAP FiO2 (%):  [30 %] 30 % Set Rate:  [14 bmp] 14 bmp Vt Set:  [450 mL] 450 mL PEEP:  [5 cmH20] 5 cmH20 Pressure Support:  [14 cmH20] 14 cmH20 Plateau Pressure:  [19 cmH20-20 cmH20] 20 cmH20   INTAKE / OUTPUT: Intake/Output      02/25 0701 - 02/26 0700 02/26 0701 - 02/27 0700   I.V. (mL/kg) 2135.4 (40.7) 89.5 (1.7)   NG/GT 647.7 150   IV Piggyback 350    Total Intake(mL/kg) 3133 (59.7) 239.5 (4.6)   Urine (mL/kg/hr) 1450 (1.2) 125 (0.6)   Emesis/NG output 200 (0.2)    Stool 0 (0)    Total Output 1650 125   Net +1483 +114.5        Stool Occurrence 1 x      PHYSICAL EXAMINATION: General: Thin  female, in NAD. Neuro: Awake, alert.  RASS 0 HEENT: [place]/AT. ETT in place.  PERRL, sclerae anicteric. Cardiovascular: RRR, no M/R/G.  Lungs: Respirations even and unlabored, lungs bilaterally with improved wheezing  Abdomen: BS x 4, soft, NT/ND.  Musculoskeletal: No gross deformities, no edema.  Skin: Intact, warm, no rashes.  LABS:  PULMONARY  Recent Labs Lab 06/21/14 1840 06/21/14 2107 06/22/14 0350  PHART 7.105* 7.092* 7.248*  PCO2ART 77.8* 63.8* 46.1*  PO2ART 162.0* 438.0* 69.3*  HCO3 23.4 18.9* 19.2*  TCO2 22.2 18.5 17.8  O2SAT 97.2 98.4 90.2   CBC  Recent Labs Lab 06/21/14 1515 06/22/14 0435  HGB 15.1* 12.4  HCT 44.1 38.0  WBC 7.8 15.6*  PLT 208 209   CARDIAC  Recent Labs Lab 06/22/14 1050 06/22/14 1752 06/23/14 0108  TROPONINI 0.06* 0.05* 0.09*   No results for input(s): PROBNP in the last 168 hours.  CHEMISTRY  Recent Labs Lab 06/21/14 1515 06/22/14 0435 06/23/14 0058  NA 128* 129*  --   K 3.9 4.0  --   CL 92* 109  --   CO2 25 20  --   GLUCOSE 116* 175*  --   BUN 10 8  --  CREATININE 0.63 0.42*  --   CALCIUM 8.7 7.5*  --   MG  --  1.7 1.9  PHOS  --  2.6 1.8*   Estimated Creatinine Clearance: 66.6 mL/min (by C-G formula based on Cr of 0.42).  LIVER  Recent Labs Lab 06/21/14 1515  AST 39*  ALT 17  ALKPHOS 77  BILITOT 0.8  PROT 7.3  ALBUMIN 4.3   INFECTIOUS  Recent Labs Lab 06/21/14 1639 06/22/14 1050 06/23/14 0058  LATICACIDVEN 0.85 1.6  --   PROCALCITON  --  0.29 0.25   ENDOCRINE CBG (last 3)   Recent Labs  06/22/14 1555 06/22/14 2002 06/22/14 2139  GLUCAP 165* 173* 207*     IMAGING x48h Dg Neck Soft Tissue  06/21/2014   CLINICAL DATA:  Neck swelling, increased since central line placement.  EXAM: NECK SOFT TISSUES - 1+ VIEW  COMPARISON:  None.  FINDINGS: NG and endotracheal tubes are in place. Left central venous catheter. Suggestion of increased density in the soft tissues of the left neck. This is  nonspecific in could be due to patient positioning, soft tissue attenuation, or hematoma. Clinical correlation recommended.  IMPRESSION: Increased density in the left neck of nonspecific etiology. Soft tissue hematoma versus soft tissue attenuation due to positioning.   Electronically Signed   By: Lucienne Capers M.D.   On: 06/21/2014 21:49   Dg Chest 2 View  06/21/2014   CLINICAL DATA:  Two day history of cough and difficulty breathing. Fever  EXAM: CHEST  2 VIEW  COMPARISON:  None.  FINDINGS: There is a nipple shadow on the left. The interstitium is mildly prominent in the lower lung zones, slightly greater on the right than on the left, probably reflecting chronic inflammatory type change. There is no frank edema or consolidation. Heart size and pulmonary vascularity are normal. No adenopathy. No bone lesions. There are surgical clips in the left axillary region.  IMPRESSION: Mild interstitial prominence in the lower lobes, probably reflecting chronic inflammatory type change. No frank edema or consolidation.   Electronically Signed   By: Lowella Grip III M.D.   On: 06/21/2014 15:58   Ct Angio Chest Pe W/cm &/or Wo Cm  06/21/2014   CLINICAL DATA:  54 year old female with shortness of breath and cough since Monday. Neck and back pain. Initial encounter. Hypoxia  EXAM: CT ANGIOGRAPHY CHEST WITH CONTRAST  TECHNIQUE: Multidetector CT imaging of the chest was performed using the standard protocol during bolus administration of intravenous contrast. Multiplanar CT image reconstructions and MIPs were obtained to evaluate the vascular anatomy.  CONTRAST:  122mL OMNIPAQUE IOHEXOL 350 MG/ML SOLN  COMPARISON:  Chest radiographs 1540 hours today. CT Abdomen and Pelvis 07/23/2011.  FINDINGS: Good contrast bolus timing in the pulmonary arterial tree. Respiratory motion artifact in the lower lobes. No focal filling defect identified in the pulmonary arterial tree to suggest the presence of acute pulmonary embolism.   Compared to the lung bases on 07/23/2011, there is diffuse pulmonary septal thickening. There is also vague and nodular peribronchovascular in the lungs bilaterally, slightly greater on the right and maximal in the lower lobe. There is superimposed central lobular upper lobe predominant emphysema. Small right side Bochdalek hernia.  No pericardial or pleural effusion. Mildly enlarged hilar lymph nodes on the right. Indistinct increased mediastinal soft tissue about the carina measuring up to 10 mm short axis. Aberrant right subclavian artery origin. Increased pretracheal soft tissue measuring up to 9 mm short axis at the level of the great  vessels. Soft and calcified plaque in the descending thoracic aorta and continuing into the abdominal aorta.  Negative visualized liver, spleen, adrenal glands, kidneys, and bowel in the upper abdomen. No axillary lymphadenopathy.  No acute osseous abnormality identified.  Review of the MIP images confirms the above findings.  IMPRESSION: 1.  No evidence of acute pulmonary embolus. 2. Abnormal lungs with a combination of septal thickening and nodular peribronchovascular opacity. Underlying emphysema. No pleural fluid. Favor bilateral pneumonia over acute pulmonary edema. 3. Increased right hilar and mediastinal soft tissue favored to be reactive lymphadenopathy. 4. Aortic atherosclerosis.  Aberrant right subclavian artery.   Electronically Signed   By: Genevie Ann M.D.   On: 06/21/2014 17:23   Dg Chest Port 1 View  06/23/2014   CLINICAL DATA:  Hypoxia  EXAM: PORTABLE CHEST - 1 VIEW  COMPARISON:  June 22, 2014  FINDINGS: Endotracheal tube tip is 5.3 cm above the carina. Central catheter tip is in the superior vena cava. Nasogastric tube tip and side port are in the stomach. No pneumothorax. There is underlying emphysema. There is generalized interstitial edema with mild alveolar edema in the right base. No new opacity. Heart size is upper normal with pulmonary vascularity within  normal limits.  IMPRESSION: Tube and catheter positions as described without pneumothorax. The appearance is consistent with a degree of post congestive heart failure superimposed on emphysematous change. Suspect mild alveolar edema in the right base, although superimposed pneumonia in the right base cannot be excluded radiographically.   Electronically Signed   By: Lowella Grip III M.D.   On: 06/23/2014 07:10   Dg Chest Port 1 View  06/22/2014   CLINICAL DATA:  Intubated patient, acute respiratory failure.  EXAM: PORTABLE CHEST - 1 VIEW  COMPARISON:  Portable chest x-ray of June 21, 2014  FINDINGS: The endotracheal tube tip lies 5.2 cm above the crotch of the carina. The esophagogastric tube tip projects below the GE junction. The left internal jugular venous catheter tip projects over the proximal portion of the SVC. The cardiac silhouette is normal in size. The pulmonary vascularity is not engorged. The interstitial markings of both lungs are increased there is increasing density in the right infrahilar region. There is a small correction there small bilateral pleural effusions.  IMPRESSION: Persistent pulmonary interstitial edema superimposed upon COPD. Right lower lobe atelectasis or pneumonia atelectasis or pneumonia. There are small bilateral pleural effusions. The support tubes and lines are in appropriate position radiographically.   Electronically Signed   By: David  Martin[place]   On: 06/22/2014 07:29   Dg Chest Port 1 View  06/21/2014   CLINICAL DATA:  Postcentral line placement.  EXAM: PORTABLE CHEST - 1 VIEW  COMPARISON:  06/21/2014  FINDINGS: Endotracheal tube tip measures 5.2 cm above the carinal. Enteric tube tip is off the field of view. Left central venous catheter has been placed since previous study. Tip overlies the low SVC. No pneumothorax. Normal heart size. Increased pulmonary vascularity with perihilar and interstitial infiltrates likely representing edema. Lung bases are not  included on the field of view. Surgical clips in the left chest.  IMPRESSION: Appliances appear in satisfactory location. Persistent pulmonary vascular congestion and bilateral pulmonary infiltrates likely due to edema.   Electronically Signed   By: Lucienne Capers M.D.   On: 06/21/2014 21:44   Dg Chest Portable 1 View  06/21/2014   CLINICAL DATA:  Shortness of breath and respiratory failure. Status post intubation.  EXAM: PORTABLE CHEST - 1 VIEW  COMPARISON:  Chest x-ray at 1540 hours  FINDINGS: Endotracheal tube present with the tip approximately 3.8 cm above the carina. A nasogastric tube has been placed which extends below the diaphragm lungs show significant worsening of diffuse pulmonary edema since the prior chest x-ray which is slightly worse in the right lung compared to the left. Given asymmetry, there may be a component of right lower lung pneumonia as well. No significant pleural fluid is identified. The heart size is stable and normal.  IMPRESSION: Significant worsening of bilateral pulmonary edema. Endotracheal tube tip is approximately 3.8 cm above the carina. Component of right lower lung pneumonia cannot be excluded.   Electronically Signed   By: Aletta Edouard M.D.   On: 06/21/2014 20:09        ASSESSMENT / PLAN:  PULMONARY OETT 2/24 (size 6.5, tough airway needing bougie >> A: Acute Hypoxic Respiratory failure - in setting of AECOPD + CAP AECOPD CAP  DIFFICULT AIRWAY - required bougie & 6.5 ETT per EDP notes Tobacco Abuse - smoker, no baseline PFT's P:   Daily SBT / WUA PRVC, wean PEEP / FiO2 for sats > 90% DuoNebs / Albuterol - do not feel she has an albuterol allergy.   Solumedrol 80 Q6 Trend CXR / ABG Nicotine patch Set up f/u with [facility] on discharge for PFT's and management of presumed COPD.  CARDIOVASCULAR CVL L IJ 2/24 >>> A:  Septic Shock due to CAP vs Hypotension due to diprivan.  Negative lactate reassuring.   P:  Levophed for goal  MAP >  65. PCT protocol  D/C propofol gtt with hypotension   RENAL A:   Hyponatremia Hypophosphatemia  Hypomagnesemia  P:   NS @ 75. Trend BMP NaPhos + Mag replacement 2/26  GASTROINTESTINAL A:   GERD Nutrition Hx diverticulosis, internal hemorrhoids P:   Pantoprazole. NPO. Continue TF   HEMATOLOGIC A:   VTE Prophylaxis P:  SCD's / Lovenox. CBC in AM.  INFECTIOUS BCx2 2/24 >> UCx 2/24 >> Sputum 2/24 >> RVP 2/24 >> Tracheal aspirate 2/25 >> U. Strep 2/25 >> neg U. Legionella 2/25 >> A:   CAP P:   Abx: Ceftriaxone, start date 2/24, day 3/x. Abx: Azithromycin, start date 2/24, day 3/x. Antiviral Tamiflu 2/25 >> Follow cultures as above   ENDOCRINE A:   No known issues  P:   SSI if glucose consistently > 180.  NEUROLOGIC A:   Acute Metabolic Encephalopathy - in setting of hypercapnia  Hx Fibromyalgia, cluster headaches, back pain P:   Sedation:  PRN Fentanyl  RASS goal: 0  Daily WUA.   Family updated: Sister and patient updated at bedside.     Noe Gens, NP-C [facility] Pgr: [phone] or [phone]   06/23/2014 11:07 AM

## 2014-06-23 NOTE — Progress Notes (Signed)
Called by RN regarding sedation.  Propofol gtt turned off and patient was given Fentanyl for discomfort.  After fentanyl administration, RN noted a decrease in BP as compared to propofol gtt.    Plan: Resume propofol gtt Continue to wean levophed NS bolus x1 now SBT/WUA in am for potential extubation   Noe Gens, NP-C [facility] Pgr: [phone] or [phone]

## 2014-06-23 NOTE — Progress Notes (Signed)
NUTRITION FOLLOW UP  Intervention:   -Continue Vital High Protein via OGT at goal rate of 40 ml/hr providing 960 kcal, 84 (100% of protein needs) g protein and 802 ml free water -Current TF regimen with propofol provides a total of 1158 (100% of kcal needs) kcal daily   Nutrition Dx:   Inadequate oral intake related to inability to eat as evidenced by NPO status; ongoing.  Goal:   Pt to meet >/= 90% of estimated needs; progressing.  Monitor:   TF tolerance, weight trends, labs  Assessment:   Pt admitted with SOB and cough x 3 days. Pt is a smoker, diagnosed with COPD. PMH significant for GERD, fibromyalgia, IBS, headaches, internal hemorroids and hiatal hernia.   Pt currently intubated on ventilator support: MVe: 7.4 Temp (24hrs), Avg:99.7 F (37.6 C), Min:98.8 F (37.1 C), Max:100.4 F (38 C)  Propfol: 7.62ml/hr providing 198 kcal/daily -turned off earlier today, but back on to 7.5 ml/hr  Pt tolerating current tube feeding regimen well per RN, currently at goal.    Height: Ht Readings from Last 1 Encounters:  06/21/14 5\' 4"  (1.626 m)    Weight Status:   Wt Readings from Last 1 Encounters:  06/23/14 115 lb 11.2 oz (52.481 kg)    Re-estimated needs:  Kcal: 1188 kcals Protein: 65-80 g protein Fluid: >/= 1.5 L/day  Skin: Pink, mottled, intact  Diet Order: Diet NPO time specified   Intake/Output Summary (Last 24 hours) at 06/23/14 1025 Last data filed at 06/23/14 0900  Gross per 24 hour  Intake 3032.28 ml  Output   1450 ml  Net 1582.28 ml    Last BM: 2/25   Labs:   Recent Labs Lab 06/21/14 1515 06/22/14 0435 06/23/14 0058  NA 128* 129*  --   K 3.9 4.0  --   CL 92* 109  --   CO2 25 20  --   BUN 10 8  --   CREATININE 0.63 0.42*  --   CALCIUM 8.7 7.5*  --   MG  --  1.7 1.9  PHOS  --  2.6 1.8*  GLUCOSE 116* 175*  --     CBG (last 3)   Recent Labs  06/22/14 1555 06/22/14 2002 06/22/14 2139  GLUCAP 165* 173* 207*    Scheduled  Meds: . antiseptic oral rinse  7 mL Mouth Rinse QID  . azithromycin  500 mg Intravenous Q24H  . cefTRIAXone (ROCEPHIN)  IV  1 g Intravenous Q24H  . chlorhexidine  15 mL Mouth Rinse BID  . enoxaparin (LOVENOX) injection  40 mg Subcutaneous Q24H  . etomidate  12 mg Intravenous Once  . feeding supplement (VITAL HIGH PROTEIN)  1,000 mL Per Tube Q24H  . insulin aspart  0-15 Units Subcutaneous TID WC  . insulin aspart  0-5 Units Subcutaneous QHS  . ipratropium-albuterol  3 mL Nebulization Q4H  . methylPREDNISolone (SOLU-MEDROL) injection  80 mg Intravenous Q6H  . nicotine  21 mg Transdermal Daily  . oseltamivir  75 mg Oral BID  . pantoprazole (PROTONIX) IV  40 mg Intravenous QHS    Continuous Infusions: . sodium chloride 75 mL/hr at 06/23/14 0600  . norepinephrine (LEVOPHED) Adult infusion 4 mcg/min (06/23/14 0935)  . propofol 15 mcg/kg/min (06/23/14 0926)    Elmer Picker MS Dietetic Intern Pager Number [phone]

## 2014-06-23 NOTE — Progress Notes (Signed)
eLink Physician-Brief Progress Note Patient Name: SARANN TREGRE DOB: 1960-06-13 MRN: [id number]   Date of Service  06/23/2014  HPI/Events of Note  Earlier KUB today showed OGT to be positioned in mid-esophagus.  Tube re-positioned but no f/u KUB.  Now with what appears to be TFs in ETT.  Residuals have been low  eICU Interventions  Plan; Re-check OGT position with KUB     Intervention Category Minor Interventions: Clinical assessment - ordering diagnostic tests  DETERDING,ELIZABETH 06/23/2014, 10:24 PM

## 2014-06-24 ENCOUNTER — Inpatient Hospital Stay (HOSPITAL_COMMUNITY): Payer: Medicare Other

## 2014-06-24 DIAGNOSIS — J111 Influenza due to unidentified influenza virus with other respiratory manifestations: Secondary | ICD-10-CM

## 2014-06-24 DIAGNOSIS — J9601 Acute respiratory failure with hypoxia: Secondary | ICD-10-CM

## 2014-06-24 LAB — GLUCOSE, CAPILLARY
GLUCOSE-CAPILLARY: 207 mg/dL — AB (ref 70–99)
Glucose-Capillary: 144 mg/dL — ABNORMAL HIGH (ref 70–99)
Glucose-Capillary: 153 mg/dL — ABNORMAL HIGH (ref 70–99)
Glucose-Capillary: 194 mg/dL — ABNORMAL HIGH (ref 70–99)

## 2014-06-24 LAB — CULTURE, RESPIRATORY

## 2014-06-24 LAB — CULTURE, RESPIRATORY W GRAM STAIN: Culture: NO GROWTH

## 2014-06-24 LAB — CBC
HCT: 34.9 % — ABNORMAL LOW (ref 36.0–46.0)
Hemoglobin: 11.7 g/dL — ABNORMAL LOW (ref 12.0–15.0)
MCH: 31 pg (ref 26.0–34.0)
MCHC: 33.5 g/dL (ref 30.0–36.0)
MCV: 92.3 fL (ref 78.0–100.0)
Platelets: 182 10*3/uL (ref 150–400)
RBC: 3.78 MIL/uL — AB (ref 3.87–5.11)
RDW: 14.8 % (ref 11.5–15.5)
WBC: 15.1 10*3/uL — AB (ref 4.0–10.5)

## 2014-06-24 LAB — RESPIRATORY VIRUS PANEL
Adenovirus: NEGATIVE
INFLUENZA B 1: NEGATIVE
Influenza A: POSITIVE — AB
Metapneumovirus: NEGATIVE
PARAINFLUENZA 1 A: NEGATIVE
PARAINFLUENZA 2 A: NEGATIVE
PARAINFLUENZA 3 A: NEGATIVE
RESPIRATORY SYNCYTIAL VIRUS A: NEGATIVE
Respiratory Syncytial Virus B: NEGATIVE
Rhinovirus: NEGATIVE

## 2014-06-24 LAB — BLOOD GAS, ARTERIAL
ACID-BASE EXCESS: 1 mmol/L (ref 0.0–2.0)
Bicarbonate: 24.5 mEq/L — ABNORMAL HIGH (ref 20.0–24.0)
FIO2: 0.3 %
LHR: 14 {breaths}/min
MECHVT: 450 mL
O2 Saturation: 96.6 %
PCO2 ART: 36.9 mmHg (ref 35.0–45.0)
PEEP: 5 cmH2O
PH ART: 7.438 (ref 7.350–7.450)
Patient temperature: 98.6
TCO2: 22.1 mmol/L (ref 0–100)
pO2, Arterial: 84.6 mmHg (ref 80.0–100.0)

## 2014-06-24 LAB — MAGNESIUM: Magnesium: 2.2 mg/dL (ref 1.5–2.5)

## 2014-06-24 LAB — BASIC METABOLIC PANEL
ANION GAP: 5 (ref 5–15)
BUN: 12 mg/dL (ref 6–23)
CALCIUM: 7.8 mg/dL — AB (ref 8.4–10.5)
CHLORIDE: 112 mmol/L (ref 96–112)
CO2: 24 mmol/L (ref 19–32)
Creatinine, Ser: 0.37 mg/dL — ABNORMAL LOW (ref 0.50–1.10)
Glucose, Bld: 193 mg/dL — ABNORMAL HIGH (ref 70–99)
Potassium: 3.7 mmol/L (ref 3.5–5.1)
Sodium: 141 mmol/L (ref 135–145)

## 2014-06-24 LAB — PROCALCITONIN: Procalcitonin: 0.1 ng/mL

## 2014-06-24 LAB — PHOSPHORUS: Phosphorus: 2.8 mg/dL (ref 2.3–4.6)

## 2014-06-24 MED ORDER — DEXTROSE 5 % IV SOLN
500.0000 mg | INTRAVENOUS | Status: AC
  Administered 2014-06-24 – 2014-06-28 (×5): 500 mg via INTRAVENOUS
  Filled 2014-06-24 (×5): qty 500

## 2014-06-24 MED ORDER — METHYLPREDNISOLONE SODIUM SUCC 125 MG IJ SOLR
60.0000 mg | Freq: Every day | INTRAMUSCULAR | Status: DC
  Administered 2014-06-25 – 2014-06-27 (×3): 60 mg via INTRAVENOUS
  Filled 2014-06-24 (×3): qty 2

## 2014-06-24 MED ORDER — SENNOSIDES 8.8 MG/5ML PO SYRP
5.0000 mL | ORAL_SOLUTION | Freq: Every day | ORAL | Status: DC | PRN
  Filled 2014-06-24: qty 5

## 2014-06-24 MED ORDER — INSULIN ASPART 100 UNIT/ML ~~LOC~~ SOLN
0.0000 [IU] | SUBCUTANEOUS | Status: DC
  Administered 2014-06-24: 2 [IU] via SUBCUTANEOUS
  Administered 2014-06-24 – 2014-06-25 (×4): 3 [IU] via SUBCUTANEOUS
  Administered 2014-06-25: 2 [IU] via SUBCUTANEOUS
  Administered 2014-06-26: 3 [IU] via SUBCUTANEOUS
  Administered 2014-06-26: 2 [IU] via SUBCUTANEOUS
  Administered 2014-06-26: 3 [IU] via SUBCUTANEOUS
  Administered 2014-06-27: 5 [IU] via SUBCUTANEOUS
  Administered 2014-06-27 – 2014-06-29 (×10): 2 [IU] via SUBCUTANEOUS
  Administered 2014-06-29 – 2014-06-30 (×5): 3 [IU] via SUBCUTANEOUS
  Administered 2014-06-30: 2 [IU] via SUBCUTANEOUS
  Administered 2014-06-30: 3 [IU] via SUBCUTANEOUS
  Administered 2014-07-01 – 2014-07-02 (×6): 2 [IU] via SUBCUTANEOUS
  Administered 2014-07-03 (×2): 3 [IU] via SUBCUTANEOUS
  Administered 2014-07-04 (×2): 2 [IU] via SUBCUTANEOUS
  Administered 2014-07-04: 5 [IU] via SUBCUTANEOUS
  Administered 2014-07-05 (×4): 2 [IU] via SUBCUTANEOUS

## 2014-06-24 MED ORDER — SODIUM CHLORIDE 0.9 % IV SOLN
25.0000 ug/h | INTRAVENOUS | Status: DC
  Administered 2014-06-24: 25 ug/h via INTRAVENOUS
  Administered 2014-06-25 (×2): 50 ug/h via INTRAVENOUS
  Administered 2014-06-25: 150 ug/h via INTRAVENOUS
  Administered 2014-06-25: 50 ug/h via INTRAVENOUS
  Administered 2014-06-26: 75 ug/h via INTRAVENOUS
  Administered 2014-06-27: 125 ug/h via INTRAVENOUS
  Filled 2014-06-24 (×4): qty 50

## 2014-06-24 NOTE — Progress Notes (Signed)
PULMONARY / CRITICAL CARE MEDICINE   Name: Elaine Le MRN: [id number] DOB: October 20, 1960    ADMISSION DATE:  06/21/2014 CONSULTATION DATE:  06/24/2014  REFERRING MD :  EDP  CHIEF COMPLAINT:  SOB  INITIAL PRESENTATION:  54 y.o. F, smoker,  admitted 2/24 with a 3 day hx of cough and SOB.  On arrival was found to have significant respiratory distress and hypoxia. Patient failed bipap and was intubated with a #6.5 ETT in ER, difficult intubation.   STUDIES:  CTA chest 2/24 >>> no PE, underlying emphysema.  Bilateral PNA, right hilar and mediastinal reactive lymphadenopathy.  SIGNIFICANT EVENTS: 2/24  Admit, intubated 2/25  Levo @ 10 mcg, sedate on vent.  Family concerned she has an 'allergy to albuterol'.   2/26  Weaned to 4 mcg levo, awake on vent, no acute events overnight, aspiration?   SUBJECTIVE:  Failed weaning, ? Aspiration event 2/26, off levophed as of 0630    VITAL SIGNS: Temp:  [99.1 F (37.3 C)-100.4 F (38 C)] 99.5 F (37.5 C) (02/27 0400) Pulse Rate:  [63-123] 123 (02/27 0900) Resp:  [14-30] 20 (02/27 0900) BP: (77-144)/(40-104) 142/80 mmHg (02/27 0900) SpO2:  [96 %-100 %] 100 % (02/27 0900) FiO2 (%):  [30 %] 30 % (02/27 0855) Weight:  [55.203 kg (121 lb 11.2 oz)] 55.203 kg (121 lb 11.2 oz) (02/27 0400)   HEMODYNAMICS:     VENTILATOR SETTINGS: Vent Mode:  [-] PRVC FiO2 (%):  [30 %] 30 % Set Rate:  [14 bmp] 14 bmp Vt Set:  [450 mL] 450 mL PEEP:  [5 cmH20] 5 cmH20 Pressure Support:  [14 cmH20] 14 cmH20 Plateau Pressure:  [20 cmH20-28 cmH20] 26 cmH20   INTAKE / OUTPUT: Intake/Output      02/26 0701 - 02/27 0700 02/27 0701 - 02/28 0700   I.V. (mL/kg) 3095.9 (56.1)    NG/GT 1200    IV Piggyback 610    Total Intake(mL/kg) 4905.9 (88.9)    Urine (mL/kg/hr) 1475 (1.1)    Emesis/NG output 345 (0.3)    Stool     Total Output 1820     Net +3085.9            PHYSICAL EXAMINATION:  Gen: sedated on vent HEENT: NCAT ETT in place PULM; wheezing  bilaterally, crackles CV: RRR, no mgr Ab: BS+, soft, nontender Ext: trace edema in hands and ankles Neuro: sedated, awakens to voice  LABS:  PULMONARY  Recent Labs Lab 06/21/14 1840 06/21/14 2107 06/22/14 0350  PHART 7.105* 7.092* 7.248*  PCO2ART 77.8* 63.8* 46.1*  PO2ART 162.0* 438.0* 69.3*  HCO3 23.4 18.9* 19.2*  TCO2 22.2 18.5 17.8  O2SAT 97.2 98.4 90.2   CBC  Recent Labs Lab 06/21/14 1515 06/22/14 0435 06/24/14 0415  HGB 15.1* 12.4 11.7*  HCT 44.1 38.0 34.9*  WBC 7.8 15.6* 15.1*  PLT 208 209 182   CARDIAC  Recent Labs Lab 06/22/14 1050 06/22/14 1752 06/23/14 0108  TROPONINI 0.06* 0.05* 0.09*   No results for input(s): PROBNP in the last 168 hours.  CHEMISTRY  Recent Labs Lab 06/21/14 1515 06/22/14 0435 06/23/14 0058 06/24/14 0415  NA 128* 129*  --  141  K 3.9 4.0  --  3.7  CL 92* 109  --  112  CO2 25 20  --  24  GLUCOSE 116* 175*  --  193*  BUN 10 8  --  12  CREATININE 0.63 0.42*  --  0.37*  CALCIUM 8.7 7.5*  --  7.8*  MG  --  1.7 1.9 2.2  PHOS  --  2.6 1.8* 2.8   Estimated Creatinine Clearance: 69.4 mL/min (by C-G formula based on Cr of 0.37).  LIVER  Recent Labs Lab 06/21/14 1515  AST 39*  ALT 17  ALKPHOS 77  BILITOT 0.8  PROT 7.3  ALBUMIN 4.3   INFECTIOUS  Recent Labs Lab 06/21/14 1639 06/22/14 1050 06/23/14 0058 06/24/14 0415  LATICACIDVEN 0.85 1.6  --   --   PROCALCITON  --  0.29 0.25 0.10   ENDOCRINE CBG (last 3)   Recent Labs  06/23/14 1722 06/23/14 2245 06/24/14 0824  GLUCAP 242* 144* 207*     IMAGING x48h Dg Abd 1 View  06/23/2014   CLINICAL DATA:  OG tube placement.  EXAM: ABDOMEN - 1 VIEW  COMPARISON:  06/23/2014  FINDINGS: Enteric tube tip and side hole projected over the left upper quadrant consistent with location in the mid stomach. Visualized bowel gas pattern is normal. Diffuse coarse fibrosis in the lungs.  IMPRESSION: Enteric tube tip localizes over the mid stomach.   Electronically Signed    By: Lucienne Capers M.D.   On: 06/23/2014 22:53   Dg Chest Port 1 View  06/24/2014   CLINICAL DATA:  Acute respiratory failure  EXAM: PORTABLE CHEST - 1 VIEW  COMPARISON:  06/23/2014  FINDINGS: An endotracheal tube, nasogastric catheter and left-sided jugular central line are again seen and stable in appearance apparent cardiac shadow is within normal limits. Increasing bilateral infiltrates are identified in the perihilar regions when compare with the prior exam. The previously seen pattern of congestive failure with interstitial edema is again noted. No sizable effusion is seen.  IMPRESSION: CHF with superimposed perihilar infiltrates bilaterally.  Tubes and lines stable from the prior exam.   Electronically Signed   By: Inez Catalina M.D.   On: 06/24/2014 06:47   Dg Chest Port 1 View  06/23/2014   CLINICAL DATA:  Hypoxia  EXAM: PORTABLE CHEST - 1 VIEW  COMPARISON:  June 22, 2014  FINDINGS: Endotracheal tube tip is 5.3 cm above the carina. Central catheter tip is in the superior vena cava. Nasogastric tube tip and side port are in the stomach. No pneumothorax. There is underlying emphysema. There is generalized interstitial edema with mild alveolar edema in the right base. No new opacity. Heart size is upper normal with pulmonary vascularity within normal limits.  IMPRESSION: Tube and catheter positions as described without pneumothorax. The appearance is consistent with a degree of post congestive heart failure superimposed on emphysematous change. Suspect mild alveolar edema in the right base, although superimposed pneumonia in the right base cannot be excluded radiographically.   Electronically Signed   By: Lowella Grip III M.D.   On: 06/23/2014 07:10   Dg Abd Portable 1v  06/23/2014   CLINICAL DATA:  Feeding tube placement.  EXAM: PORTABLE ABDOMEN - 1 VIEW  COMPARISON:  July 02, 2010.  FINDINGS: The bowel gas pattern is normal. Distal tip of feeding tube appears to be at the level of  midesophagus.  IMPRESSION: Distal tip of feeding tube appears to be at the level of mid esophagus. Advancement is recommended.   Electronically Signed   By: Marijo Conception, M.D.   On: 06/23/2014 16:59     ASSESSMENT / PLAN:  PULMONARY OETT 2/24 (size 6.5, difficult airway needing bougie >> A: Acute Hypoxic Respiratory failure - in setting of AECOPD + CAP AECOPD CAP  DIFFICULT AIRWAY - required bougie &  6.5 ETT per EDP notes Tobacco Abuse - smoker, no baseline PFT's P:   Daily SBT / WUA PRVC, wean PEEP / FiO2 for sats > 90% DuoNebs / Albuterol - she does not have an albuterol allergy Wean  Solumedrol to 60 daily Trend CXR / ABG Nicotine patch Set up f/u with [place] [facility] on discharge for PFT's and management of presumed COPD.  CARDIOVASCULAR CVL L IJ 2/24 >>> A:  Septic Shock due to CAP vs Hypotension > resolved P:  Levophed for goal  MAP > 65 D/C propofol gtt with hypotension  Check CVP > goal is < 4 CVP q shift  RENAL A:   Hyponatremia Hypophosphatemia  Hypomagnesemia  P:   KVO fluids Monitor BMET and UOP Replace electrolytes as needed  GASTROINTESTINAL A:   GERD Nutrition Hx diverticulosis, internal hemorrhoids P:   Pantoprazole NPO. Continue TF   HEMATOLOGIC A:   VTE Prophylaxis P:  SCD's / Lovenox CBC in AM  INFECTIOUS BCx2 2/24 >> UCx 2/24 >> Sputum 2/24 >> RVP 2/24 >> INFLUENZA A Tracheal aspirate 2/25 >> U. Strep 2/25 >> neg U. Legionella 2/25 >> A:   CAP  P:   Abx: Ceftriaxone, start date 2/24, day 4/7 Abx: Azithromycin, start date 2/24, day 4/7  Antiviral Tamiflu 2/25 >>   ENDOCRINE A:   No known issues  P:   SSI if glucose consistently > 180.  NEUROLOGIC A:   Acute Metabolic Encephalopathy - in setting of hypercapnia  Hx Fibromyalgia, cluster headaches, back pain P:   Sedation:  Fentanyl gtt, try to wean propofol RASS goal: -2 Daily WUA.   Family updated: Sister and mother updated at bedside.    My cc  time 45 minutes  Roselie Awkward, MD [place] [facility] Pager: [phone] Cell: [phone] If no response, call [phone]   06/24/2014 11:38 AM

## 2014-06-24 NOTE — Progress Notes (Signed)
eLink Physician-Brief Progress Note Patient Name: Elaine Le DOB: [birth year] MRN: [id number]   Date of Service  06/24/2014  HPI/Events of Note   Per protocol analysis of antimicrobials performed. Patient Flu positive on Tamiflu, CTX, and azithro.   eICU Interventions   Continue current regimen.       Intervention Category Minor Interventions: Routine modifications to care plan (e.g. PRN medications for pain, fever)  [name] R. 06/24/2014, 4:59 PM

## 2014-06-24 NOTE — Plan of Care (Signed)
Problem: Phase I Progression Outcomes Goal: VTE prophylaxis Outcome: Completed/Met Date Met:  06/24/14 SCDs & SQ Lovenox Goal: GIProphysixis Outcome: Completed/Met Date Met:  06/24/14 IV Protonix      

## 2014-06-25 ENCOUNTER — Inpatient Hospital Stay (HOSPITAL_COMMUNITY): Payer: Medicare Other

## 2014-06-25 DIAGNOSIS — J441 Chronic obstructive pulmonary disease with (acute) exacerbation: Secondary | ICD-10-CM

## 2014-06-25 LAB — GLUCOSE, CAPILLARY
GLUCOSE-CAPILLARY: 125 mg/dL — AB (ref 70–99)
GLUCOSE-CAPILLARY: 108 mg/dL — AB (ref 70–99)
GLUCOSE-CAPILLARY: 129 mg/dL — AB (ref 70–99)
GLUCOSE-CAPILLARY: 156 mg/dL — AB (ref 70–99)
Glucose-Capillary: 117 mg/dL — ABNORMAL HIGH (ref 70–99)
Glucose-Capillary: 118 mg/dL — ABNORMAL HIGH (ref 70–99)
Glucose-Capillary: 183 mg/dL — ABNORMAL HIGH (ref 70–99)

## 2014-06-25 LAB — BASIC METABOLIC PANEL
Anion gap: 3 — ABNORMAL LOW (ref 5–15)
BUN: 14 mg/dL (ref 6–23)
CHLORIDE: 109 mmol/L (ref 96–112)
CO2: 29 mmol/L (ref 19–32)
Calcium: 7.7 mg/dL — ABNORMAL LOW (ref 8.4–10.5)
Creatinine, Ser: 0.39 mg/dL — ABNORMAL LOW (ref 0.50–1.10)
GFR calc Af Amer: >90 mL/min (ref >90)
GFR calc non Af Amer: >90 mL/min (ref >90)
Glucose, Bld: 127 mg/dL — ABNORMAL HIGH (ref 70–99)
POTASSIUM: 3.9 mmol/L (ref 3.5–5.1)
SODIUM: 141 mmol/L (ref 135–145)

## 2014-06-25 LAB — CBC WITH DIFFERENTIAL/PLATELET
BASOS ABS: 0 10*3/uL (ref 0.0–0.1)
BASOS PCT: 0 % (ref 0–1)
EOS PCT: 0 % (ref 0–5)
Eosinophils Absolute: 0 10*3/uL (ref 0.0–0.7)
HCT: 37.9 % (ref 36.0–46.0)
HEMOGLOBIN: 12.1 g/dL (ref 12.0–15.0)
LYMPHS PCT: 8 % — AB (ref 12–46)
Lymphs Abs: 1.2 10*3/uL (ref 0.7–4.0)
MCH: 30.2 pg (ref 26.0–34.0)
MCHC: 31.9 g/dL (ref 30.0–36.0)
MCV: 94.5 fL (ref 78.0–100.0)
Monocytes Absolute: 1.2 10*3/uL — ABNORMAL HIGH (ref 0.1–1.0)
Monocytes Relative: 8 % (ref 3–12)
NEUTROS PCT: 84 % — AB (ref 43–77)
Neutro Abs: 13.4 10*3/uL — ABNORMAL HIGH (ref 1.7–7.7)
PLATELETS: 204 10*3/uL (ref 150–400)
RBC: 4.01 MIL/uL (ref 3.87–5.11)
RDW: 15.1 % (ref 11.5–15.5)
WBC: 15.9 10*3/uL — AB (ref 4.0–10.5)

## 2014-06-25 LAB — PHOSPHORUS: Phosphorus: 2.3 mg/dL (ref 2.3–4.6)

## 2014-06-25 LAB — MAGNESIUM: Magnesium: 2.2 mg/dL (ref 1.5–2.5)

## 2014-06-25 MED ORDER — MIDAZOLAM HCL 2 MG/2ML IJ SOLN
1.0000 mg | INTRAMUSCULAR | Status: DC | PRN
  Administered 2014-06-25 – 2014-06-26 (×6): 2 mg via INTRAVENOUS
  Administered 2014-06-26: 1 mg via INTRAVENOUS
  Administered 2014-06-27: 2 mg via INTRAVENOUS
  Filled 2014-06-25 (×10): qty 2

## 2014-06-25 MED ORDER — FUROSEMIDE 10 MG/ML IJ SOLN
20.0000 mg | Freq: Four times a day (QID) | INTRAMUSCULAR | Status: AC
  Administered 2014-06-25 (×2): 20 mg via INTRAVENOUS
  Filled 2014-06-25 (×2): qty 2

## 2014-06-25 MED ORDER — POTASSIUM CHLORIDE CRYS ER 20 MEQ PO TBCR: 40.0000 meq | EXTENDED_RELEASE_TABLET | Freq: Once | ORAL | Status: DC

## 2014-06-25 MED ORDER — POTASSIUM CHLORIDE 20 MEQ/15ML (10%) PO SOLN
40.0000 meq | Freq: Once | ORAL | Status: AC
  Administered 2014-06-25: 40 meq via ORAL
  Filled 2014-06-25: qty 30

## 2014-06-25 MED ORDER — SODIUM CHLORIDE 0.9 % IV SOLN
INTRAVENOUS | Status: DC
  Administered 2014-06-25: 500 mL via INTRAVENOUS

## 2014-06-25 NOTE — Progress Notes (Signed)
Upon initial examination RN noted patient to be awake, moving in bed, not resting, and mouthing that she was "hungry". RN attempted to create a relaxing environment by dimming the lights, turning the television down, and speaking with family members about decreasing stimulation to the patient and the importance of the patients rest. The Fentanyl gtt was also increased, which caused a drop in the patients blood pressure. Levophed was started and titrated to maintain a MAP of 65 or greater. Despite the increase in Fentanyl and environmental efforts, the patient was still maintaining a RASS of +1, at that time, Diprovan was restarted to maintain the RASS goal of -2. Fentanyl, Diprovan, and Levophed have been maintained throughout the late night and Jomaira Darr morning hours to maintain the RASS of -2. RN will attempt to decrease the Diprovan as tolerated. Luther Parody, RN

## 2014-06-25 NOTE — Progress Notes (Signed)
PULMONARY / CRITICAL CARE MEDICINE   Name: Elaine Le MRN: [id number] DOB: 09-02-1960    ADMISSION DATE:  06/21/2014 CONSULTATION DATE:  06/25/2014  REFERRING MD :  EDP  CHIEF COMPLAINT:  SOB  INITIAL PRESENTATION:  54 y.o. F, smoker,  admitted 2/24 with a 3 day hx of cough and SOB.  On arrival was found to have significant respiratory distress and hypoxia. Patient failed bipap and was intubated with a #6.5 ETT in ER, difficult intubation.   STUDIES:  CTA chest 2/24 >>> no PE, underlying emphysema.  Bilateral PNA, right hilar and mediastinal reactive lymphadenopathy.  SIGNIFICANT EVENTS: 2/24  Admit, intubated 2/25  Levo @ 10 mcg, sedate on vent.  Family concerned she has an 'allergy to albuterol'.   2/26  Weaned to 4 mcg levo, awake on vent, no acute events overnight, aspiration? 2/27 weaning levophed  SUBJECTIVE:  Failed SBT 2/28 AM    VITAL SIGNS: Temp:  [96.5 F (35.8 C)-98.8 F (37.1 C)] 98.8 F (37.1 C) (02/28 0753) Pulse Rate:  [73-107] 80 (02/28 0700) Resp:  [13-25] 14 (02/28 0700) BP: (72-144)/(41-78) 106/66 mmHg (02/28 0700) SpO2:  [94 %-100 %] 96 % (02/28 0751) FiO2 (%):  [30 %] 30 % (02/28 0852) Weight:  [56.609 kg (124 lb 12.8 oz)] 56.609 kg (124 lb 12.8 oz) (02/28 0600)   HEMODYNAMICS: CVP:  [11 mmHg-21 mmHg] 11 mmHg   VENTILATOR SETTINGS: Vent Mode:  [-] CPAP FiO2 (%):  [30 %] 30 % Set Rate:  [14 bmp] 14 bmp Vt Set:  [450 mL] 450 mL PEEP:  [5 cmH20] 5 cmH20 Pressure Support:  [8 cmH20] 8 cmH20 Plateau Pressure:  [18 cmH20-24 cmH20] 22 cmH20   INTAKE / OUTPUT: Intake/Output      02/27 0701 - 02/28 0700 02/28 0701 - 02/29 0700   I.V. (mL/kg) 1038.7 (18.3)    Other 0    NG/GT 1070    IV Piggyback 300    Total Intake(mL/kg) 2408.7 (42.5)    Urine (mL/kg/hr) 950 (0.7)    Emesis/NG output 0 (0)    Total Output 950     Net +1458.7            PHYSICAL EXAMINATION:  Gen: sedated on vent HEENT: NCAT ETT in place PULM; wheezing and  crackles bilaterally CV: RRR, no mgr Ab: BS+, soft, nontender Ext: notable edema in hands and ankles Neuro: sedated, awakens to voice  LABS:  PULMONARY  Recent Labs Lab 06/21/14 1840 06/21/14 2107 06/22/14 0350 06/24/14 1358  PHART 7.105* 7.092* 7.248* 7.438  PCO2ART 77.8* 63.8* 46.1* 36.9  PO2ART 162.0* 438.0* 69.3* 84.6  HCO3 23.4 18.9* 19.2* 24.5*  TCO2 22.2 18.5 17.8 22.1  O2SAT 97.2 98.4 90.2 96.6   CBC  Recent Labs Lab 06/22/14 0435 06/24/14 0415 06/25/14 0515  HGB 12.4 11.7* 12.1  HCT 38.0 34.9* 37.9  WBC 15.6* 15.1* 15.9*  PLT 209 182 204   CARDIAC  Recent Labs Lab 06/22/14 1050 06/22/14 1752 06/23/14 0108  TROPONINI 0.06* 0.05* 0.09*   No results for input(s): PROBNP in the last 168 hours.  CHEMISTRY  Recent Labs Lab 06/21/14 1515 06/22/14 0435 06/23/14 0058 06/24/14 0415 06/25/14 0515  NA 128* 129*  --  141 141  K 3.9 4.0  --  3.7 3.9  CL 92* 109  --  112 109  CO2 25 20  --  24 29  GLUCOSE 116* 175*  --  193* 127*  BUN 10 8  --  12 14  CREATININE 0.63 0.42*  --  0.37* 0.39*  CALCIUM 8.7 7.5*  --  7.8* 7.7*  MG  --  1.7 1.9 2.2 2.2  PHOS  --  2.6 1.8* 2.8 2.3   Estimated Creatinine Clearance: 69.4 mL/min (by C-G formula based on Cr of 0.39).  LIVER  Recent Labs Lab 06/21/14 1515  AST 39*  ALT 17  ALKPHOS 77  BILITOT 0.8  PROT 7.3  ALBUMIN 4.3   INFECTIOUS  Recent Labs Lab 06/21/14 1639 06/22/14 1050 06/23/14 0058 06/24/14 0415  LATICACIDVEN 0.85 1.6  --   --   PROCALCITON  --  0.29 0.25 0.10   ENDOCRINE CBG (last 3)   Recent Labs  06/24/14 1220 06/24/14 1615 06/25/14 0828  GLUCAP 153* 194* 117*     IMAGING x48h Dg Abd 1 View  06/23/2014   CLINICAL DATA:  OG tube placement.  EXAM: ABDOMEN - 1 VIEW  COMPARISON:  06/23/2014  FINDINGS: Enteric tube tip and side hole projected over the left upper quadrant consistent with location in the mid stomach. Visualized bowel gas pattern is normal. Diffuse coarse  fibrosis in the lungs.  IMPRESSION: Enteric tube tip localizes over the mid stomach.   Electronically Signed   By: Lucienne Capers M.D.   On: 06/23/2014 22:53   Dg Chest Port 1 View  06/25/2014   CLINICAL DATA:  Portable chest x-ray for acute respiratory failure.  EXAM: PORTABLE CHEST - 1 VIEW  COMPARISON:  06/24/2014  FINDINGS: The trachea tube, NG to, and right tissues line are unchanged. Stable cardiac silhouette. There is bilateral patchy airspace disease with some and consolidation in the lower lobes. No pneumothorax.  IMPRESSION: 1. Stable support apparatus. 2. Bilateral diffuse and patchy airspace disease is concerning for multifocal pneumonia. Some increased consolidation in the lower lobes.   Electronically Signed   By: Suzy Bouchard M.D.   On: 06/25/2014 08:59   Dg Chest Port 1 View  06/24/2014   CLINICAL DATA:  Acute respiratory failure  EXAM: PORTABLE CHEST - 1 VIEW  COMPARISON:  06/23/2014  FINDINGS: An endotracheal tube, nasogastric catheter and left-sided jugular central line are again seen and stable in appearance apparent cardiac shadow is within normal limits. Increasing bilateral infiltrates are identified in the perihilar regions when compare with the prior exam. The previously seen pattern of congestive failure with interstitial edema is again noted. No sizable effusion is seen.  IMPRESSION: CHF with superimposed perihilar infiltrates bilaterally.  Tubes and lines stable from the prior exam.   Electronically Signed   By: Inez Catalina M.D.   On: 06/24/2014 06:47   Dg Abd Portable 1v  06/23/2014   CLINICAL DATA:  Feeding tube placement.  EXAM: PORTABLE ABDOMEN - 1 VIEW  COMPARISON:  July 02, 2010.  FINDINGS: The bowel gas pattern is normal. Distal tip of feeding tube appears to be at the level of midesophagus.  IMPRESSION: Distal tip of feeding tube appears to be at the level of mid esophagus. Advancement is recommended.   Electronically Signed   By: Marijo Conception, M.D.   On:  06/23/2014 16:59     ASSESSMENT / PLAN:  PULMONARY OETT 2/24 (size 6.5, difficult airway needing bougie >> A: Acute Hypoxic Respiratory failure - in setting of AECOPD + CAP; some pulmonary edema  2/28 DIFFICULT AIRWAY - required bougie & 6.5 ETT per EDP notes Tobacco Abuse - smoker, no baseline PFT's P:   Daily SBT / WUA PRVC, wean PEEP / FiO2 for sats >  90% DuoNebs / Albuterol  Maintain Solumedrol 60 daily Trend CXR / ABG Nicotine patch 2/28 diurese Set up f/u with [place] [facility] on discharge for PFT's and management of presumed COPD.  CARDIOVASCULAR CVL L IJ 2/24 >>> A:  Septic Shock resolved but still some hypotension 2/28 likely due to sedation (nurses note more hypotension to fentanyl than to propofol) P:  Levophed for goal  MAP > 65 Wean off fentanyl gtt due to BP issues Check CVP > goal is < 4 when off pressors CVP q shift  RENAL A:   Hyponatremia Hypophosphatemia  Hypomagnesemia  P:   KVO fluids Monitor BMET and UOP Replace electrolytes as needed  GASTROINTESTINAL A:   GERD Nutrition Hx diverticulosis, internal hemorrhoids P:   Pantoprazole Continue TF   HEMATOLOGIC A:   VTE Prophylaxis P:  SCD's / Lovenox CBC in AM  INFECTIOUS BCx2 2/24 >> UCx 2/24 >> Sputum 2/24 >> RVP 2/24 >> INFLUENZA A Tracheal aspirate 2/25 >> U. Strep 2/25 >> neg U. Legionella 2/25 >> A:   CAP  P:   Abx: Ceftriaxone, start date 2/24, day 5/7 Abx: Azithromycin, start date 2/24, day 5/7  Antiviral Tamiflu 2/25 >>   ENDOCRINE A:   No known issues  P:   SSI if glucose consistently > 180.  NEUROLOGIC A:   Acute Metabolic Encephalopathy - in setting of hypercapnia  Hx Fibromyalgia, cluster headaches, back pain P:   Sedation:  Wean off fentanyl gtt, maintain propofol, may need versed gtt RASS goal: -2 Daily WUA.   Family updated: Sister, father, brother updated at bedside at length 2/28   My cc time 82 minutes  Roselie Awkward, MD [place]  PCCM Pager: [phone] Cell: [phone] If no response, call [phone]   06/25/2014 11:26 AM

## 2014-06-26 ENCOUNTER — Inpatient Hospital Stay (HOSPITAL_COMMUNITY): Payer: Medicare Other

## 2014-06-26 LAB — BASIC METABOLIC PANEL
Anion gap: 3 — ABNORMAL LOW (ref 5–15)
BUN: 18 mg/dL (ref 6–23)
CHLORIDE: 105 mmol/L (ref 96–112)
CO2: 37 mmol/L — ABNORMAL HIGH (ref 19–32)
CREATININE: 0.4 mg/dL — AB (ref 0.50–1.10)
Calcium: 8 mg/dL — ABNORMAL LOW (ref 8.4–10.5)
GFR calc Af Amer: >90 mL/min (ref >90)
Glucose, Bld: 102 mg/dL — ABNORMAL HIGH (ref 70–99)
Potassium: 4 mmol/L (ref 3.5–5.1)
Sodium: 145 mmol/L (ref 135–145)

## 2014-06-26 LAB — CBC WITH DIFFERENTIAL/PLATELET
Basophils Absolute: 0 10*3/uL (ref 0.0–0.1)
Basophils Relative: 0 % (ref 0–1)
EOS ABS: 0 10*3/uL (ref 0.0–0.7)
Eosinophils Relative: 0 % (ref 0–5)
HCT: 34.2 % — ABNORMAL LOW (ref 36.0–46.0)
HEMOGLOBIN: 10.8 g/dL — AB (ref 12.0–15.0)
Lymphocytes Relative: 12 % (ref 12–46)
Lymphs Abs: 1.4 10*3/uL (ref 0.7–4.0)
MCH: 30.3 pg (ref 26.0–34.0)
MCHC: 31.6 g/dL (ref 30.0–36.0)
MCV: 96.1 fL (ref 78.0–100.0)
Monocytes Absolute: 1 10*3/uL (ref 0.1–1.0)
Monocytes Relative: 8 % (ref 3–12)
NEUTROS PCT: 80 % — AB (ref 43–77)
Neutro Abs: 9.5 10*3/uL — ABNORMAL HIGH (ref 1.7–7.7)
Platelets: 166 10*3/uL (ref 150–400)
RBC: 3.56 MIL/uL — ABNORMAL LOW (ref 3.87–5.11)
RDW: 15.1 % (ref 11.5–15.5)
WBC: 11.9 10*3/uL — ABNORMAL HIGH (ref 4.0–10.5)

## 2014-06-26 LAB — GLUCOSE, CAPILLARY
GLUCOSE-CAPILLARY: 106 mg/dL — AB (ref 70–99)
GLUCOSE-CAPILLARY: 167 mg/dL — AB (ref 70–99)
Glucose-Capillary: 109 mg/dL — ABNORMAL HIGH (ref 70–99)
Glucose-Capillary: 93 mg/dL (ref 70–99)
Glucose-Capillary: 134 mg/dL — ABNORMAL HIGH (ref 70–99)
Glucose-Capillary: 154 mg/dL — ABNORMAL HIGH (ref 70–99)

## 2014-06-26 LAB — MAGNESIUM: Magnesium: 2.2 mg/dL (ref 1.5–2.5)

## 2014-06-26 LAB — PHOSPHORUS: Phosphorus: 2.9 mg/dL (ref 2.3–4.6)

## 2014-06-26 MED ORDER — ONDANSETRON HCL 4 MG/2ML IJ SOLN
4.0000 mg | Freq: Once | INTRAMUSCULAR | Status: AC
  Administered 2014-06-26: 4 mg via INTRAVENOUS
  Filled 2014-06-26: qty 2

## 2014-06-26 MED ORDER — FUROSEMIDE 10 MG/ML IJ SOLN
60.0000 mg | Freq: Once | INTRAMUSCULAR | Status: AC
  Administered 2014-06-26: 60 mg via INTRAVENOUS
  Filled 2014-06-26: qty 6

## 2014-06-26 MED ORDER — LIP MEDEX EX OINT
TOPICAL_OINTMENT | CUTANEOUS | Status: AC
  Administered 2014-06-26: 19:00:00
  Filled 2014-06-26: qty 7

## 2014-06-26 NOTE — Care Management Note (Signed)
CARE MANAGEMENT NOTE 06/26/2014  Patient:  [facility] R   Account Number:  [id number]  Date Initiated:  06/26/2014  Documentation initiated by:  DAVIS,RHONDA  Subjective/Objective Assessment:   pt intubated in ed after familing bipap     Action/Plan:   tbd   Anticipated DC Date:  06/29/2014   Anticipated DC Plan:    In-house referral  Clinical Social Worker      DC Planning Services  CM consult      [facility] Choice  NA   Choice offered to / List presented to:  NA   DME arranged  NA      DME agency  NA     [place] arranged  NA      [place] agency  NA   Status of service:  In process, will continue to follow Medicare Important Message given?   (If response is "NO", the following Medicare IM given date fields will be blank) Date Medicare IM given:   Medicare IM given by:   Date Additional Medicare IM given:   Additional Medicare IM given by:    Discharge Disposition:    Per UR Regulation:  Reviewed for med. necessity/level of care/duration of stay  If discussed at [place] of Stay Meetings, dates discussed:    Comments:  Feb. 29 2016/Rhonda L. Rosana Hoes, RN, BSN, CCM. Case Management [place] [phone] No discharge needs present of time of review.

## 2014-06-26 NOTE — Progress Notes (Signed)
PULMONARY / CRITICAL CARE MEDICINE   Name: LAURIAN EDRINGTON MRN: [id number] DOB: 1961/04/01    ADMISSION DATE:  06/21/2014 CONSULTATION DATE:  06/26/2014  REFERRING MD :  EDP  CHIEF COMPLAINT:  SOB  INITIAL PRESENTATION:  54 y.o. F, smoker,  admitted 2/24 with a 3 day hx of cough and SOB.  On arrival was found to have significant respiratory distress and hypoxia. Patient failed bipap and was intubated with a #6.5 ETT in ER, difficult intubation.   STUDIES:  CTA chest 2/24 >>> no PE, underlying emphysema.  Bilateral PNA, right hilar and mediastinal reactive lymphadenopathy.  SIGNIFICANT EVENTS: 2/24  Admit, intubated 2/25  Levo @ 10 mcg, sedate on vent.  Family concerned she has an 'allergy to albuterol'.   2/26  Weaned to 4 mcg levo, awake on vent, no acute events overnight, aspiration? 2/27 weaning levophed  SUBJECTIVE:   Tolerating PSV this am but low Vt's in the 200's  VITAL SIGNS: Temp:  [98.1 F (36.7 C)-98.8 F (37.1 C)] 98.1 F (36.7 C) (02/29 0800) Pulse Rate:  [72-120] 87 (02/29 0930) Resp:  [12-25] 16 (02/29 0930) BP: (81-120)/(27-80) 117/62 mmHg (02/29 0930) SpO2:  [94 %-100 %] 97 % (02/29 0930) FiO2 (%):  [30 %-40 %] 40 % (02/29 1032) Weight:  [56.926 kg (125 lb 8 oz)] 56.926 kg (125 lb 8 oz) (02/29 0345)   HEMODYNAMICS: CVP:  [9 mmHg-16 mmHg] 9 mmHg   VENTILATOR SETTINGS: Vent Mode:  [-] PRVC FiO2 (%):  [30 %-40 %] 40 % Set Rate:  [14 bmp] 14 bmp Vt Set:  [450 mL] 450 mL PEEP:  [5 cmH20] 5 cmH20 Pressure Support:  [5 cmH20] 5 cmH20 Plateau Pressure:  [20 cmH20-22 cmH20] 20 cmH20   INTAKE / OUTPUT: Intake/Output      02/28 0701 - 02/29 0700 02/29 0701 - 03/01 0700   I.V. (mL/kg) 626.2 (11) 69.5 (1.2)   Other     NG/GT 920 40   IV Piggyback 300    Total Intake(mL/kg) 1846.2 (32.4) 109.5 (1.9)   Urine (mL/kg/hr) 2835 (2.1) 65 (0.3)   Emesis/NG output     Total Output 2835 65   Net -988.8 +44.5          PHYSICAL EXAMINATION:  Gen: sedated  on vent HEENT: NCAT ETT in place PULM; R mostly clear, significant L insp and exp rhonchi and exp wheeze CV: RRR, no mgr Ab: BS+, soft, nontender Ext: notable edema in hands and ankles Neuro: awake, move B UE, follows commands, strong cough  LABS:  PULMONARY  Recent Labs Lab 06/21/14 1840 06/21/14 2107 06/22/14 0350 06/24/14 1358  PHART 7.105* 7.092* 7.248* 7.438  PCO2ART 77.8* 63.8* 46.1* 36.9  PO2ART 162.0* 438.0* 69.3* 84.6  HCO3 23.4 18.9* 19.2* 24.5*  TCO2 22.2 18.5 17.8 22.1  O2SAT 97.2 98.4 90.2 96.6   CBC  Recent Labs Lab 06/24/14 0415 06/25/14 0515 06/26/14 0440  HGB 11.7* 12.1 10.8*  HCT 34.9* 37.9 34.2*  WBC 15.1* 15.9* 11.9*  PLT 182 204 166   CARDIAC  Recent Labs Lab 06/22/14 1050 06/22/14 1752 06/23/14 0108  TROPONINI 0.06* 0.05* 0.09*   No results for input(s): PROBNP in the last 168 hours.  CHEMISTRY  Recent Labs Lab 06/21/14 1515 06/22/14 0435 06/23/14 0058 06/24/14 0415 06/25/14 0515 06/26/14 0440  NA 128* 129*  --  141 141 145  K 3.9 4.0  --  3.7 3.9 4.0  CL 92* 109  --  112 109 105  CO2 25  20  --  24 29 37*  GLUCOSE 116* 175*  --  193* 127* 102*  BUN 10 8  --  12 14 18   CREATININE 0.63 0.42*  --  0.37* 0.39* 0.40*  CALCIUM 8.7 7.5*  --  7.8* 7.7* 8.0*  MG  --  1.7 1.9 2.2 2.2 2.2  PHOS  --  2.6 1.8* 2.8 2.3 2.9   Estimated Creatinine Clearance: 69.4 mL/min (by C-G formula based on Cr of 0.4).  LIVER  Recent Labs Lab 06/21/14 1515  AST 39*  ALT 17  ALKPHOS 77  BILITOT 0.8  PROT 7.3  ALBUMIN 4.3   INFECTIOUS  Recent Labs Lab 06/21/14 1639 06/22/14 1050 06/23/14 0058 06/24/14 0415  LATICACIDVEN 0.85 1.6  --   --   PROCALCITON  --  0.29 0.25 0.10   ENDOCRINE CBG (last 3)   Recent Labs  06/26/14 0043 06/26/14 0337 06/26/14 0805  GLUCAP 93 106* 109*     IMAGING x48h Dg Chest Port 1 View  06/26/2014   CLINICAL DATA:  Acute respiratory failure with hypoxia  EXAM: PORTABLE CHEST - 1 VIEW   COMPARISON:  June 25, 2014  FINDINGS: Endotracheal tube tip is 4.5 cm above the carina. Nasogastric tube tip and side port are below the diaphragm. Central catheter tip is in the superior cava. No pneumothorax. There is diffuse interstitial edema with patchy alveolar opacity throughout both lungs. There is a small right pleural effusion. The appearance is stable. Heart size and pulmonary vascularity are normal. No adenopathy.  IMPRESSION: Tube and catheter positions as described without pneumothorax. Interstitial and patchy alveolar opacity diffusely. Suspect multifocal pneumonia. Atypical congestive heart failure and/or ARDS superimposed also are considerations. More than one of these entities may well exist concurrently. There is a small right effusion.   Electronically Signed   By: Lowella Grip III M.D.   On: 06/26/2014 07:24   Dg Chest Port 1 View  06/25/2014   CLINICAL DATA:  Portable chest x-ray for acute respiratory failure.  EXAM: PORTABLE CHEST - 1 VIEW  COMPARISON:  06/24/2014  FINDINGS: The trachea tube, NG to, and right tissues line are unchanged. Stable cardiac silhouette. There is bilateral patchy airspace disease with some and consolidation in the lower lobes. No pneumothorax.  IMPRESSION: 1. Stable support apparatus. 2. Bilateral diffuse and patchy airspace disease is concerning for multifocal pneumonia. Some increased consolidation in the lower lobes.   Electronically Signed   By: Suzy Bouchard M.D.   On: 06/25/2014 08:59     ASSESSMENT / PLAN:  PULMONARY OETT 2/24 (size 6.5, difficult airway needing bougie) >> A: Acute Hypoxic Respiratory failure - in setting of AECOPD + Flu A + CAP; some pulmonary edema  2/28 DIFFICULT AIRWAY - required bougie & 6.5 ETT per EDP notes Tobacco Abuse - smoker, no baseline PFT's P:   Daily SBT / WUA, do not believe she has made enough overall progress form her infxn to attempt extubation 2/29 PRVC, wean PEEP / FiO2 for sats > 90% DuoNebs  / Albuterol  Maintain Solumedrol 60 daily Trend CXR / ABG Nicotine patch Tolerated diuresis 2/28, repeat x 1 on 2/29 Set up f/u with [facility] on discharge for PFT's and management of presumed COPD.  CARDIOVASCULAR CVL L IJ 2/24 >>> A:  Septic Shock resolved but still some hypotension 2/28 and 2/29 likely due to sedation (nurses note more hypotension to fentanyl than to propofol) P:  Levophed for goal  MAP > 65 Wean fentanyl gtt  due to BP issues Check CVP > goal is ~ 4 when off pressors CVP q shift  RENAL A:   Hyponatremia Hypophosphatemia  Hypomagnesemia  P:   KVO fluids Monitor BMET and UOP Replace electrolytes as needed  GASTROINTESTINAL A:   GERD Nutrition Hx diverticulosis, internal hemorrhoids P:   Pantoprazole Continue TF > held 2/29 am for high residuals; will restart this pm and attempt to advance  HEMATOLOGIC A:   VTE Prophylaxis P:  SCD's / Lovenox CBC in AM  INFECTIOUS BCx2 2/24 >> UCx 2/24 >> Sputum 2/24 >> RVP 2/24 >> INFLUENZA A Tracheal aspirate 2/25 >> U. Strep 2/25 >> neg U. Legionella 2/25 >> A:   CAP  P:   Abx: Ceftriaxone, start date 2/24, day 6/7 Abx: Azithromycin, start date 2/24, day 6/7  Antiviral Tamiflu 2/25 >> 2/29   ENDOCRINE A:   No known issues  P:   SSI if glucose consistently > 180.  NEUROLOGIC A:   Acute Metabolic Encephalopathy - in setting of hypercapnia  Hx Fibromyalgia, cluster headaches, back pain P:   Sedation:  Wean fentanyl gtt,  RASS goal: -1 Daily WUA.   Family updated: Sister, father, brother updated at bedside at length 2/28 by Dr Lake Bells, father and brother updated by Dr Lamonte Sakai on 2/29   My cc time 40 minutes  Baltazar Apo, MD, PhD 06/26/2014, 11:30 AM [facility] [phone] or if no answer [phone]

## 2014-06-27 ENCOUNTER — Inpatient Hospital Stay (HOSPITAL_COMMUNITY): Payer: Medicare Other

## 2014-06-27 DIAGNOSIS — J90 Pleural effusion, not elsewhere classified: Secondary | ICD-10-CM | POA: Diagnosis present

## 2014-06-27 LAB — BASIC METABOLIC PANEL
Anion gap: 3 — ABNORMAL LOW (ref 5–15)
BUN: 19 mg/dL (ref 6–23)
CO2: 38 mmol/L — ABNORMAL HIGH (ref 19–32)
Calcium: 8 mg/dL — ABNORMAL LOW (ref 8.4–10.5)
Chloride: 103 mmol/L (ref 96–112)
Creatinine, Ser: 0.37 mg/dL — ABNORMAL LOW (ref 0.50–1.10)
GFR calc non Af Amer: >90 mL/min (ref >90)
Glucose, Bld: 122 mg/dL — ABNORMAL HIGH (ref 70–99)
Potassium: 3.5 mmol/L (ref 3.5–5.1)
SODIUM: 144 mmol/L (ref 135–145)

## 2014-06-27 LAB — CULTURE, BLOOD (ROUTINE X 2)
CULTURE: NO GROWTH
Culture: NO GROWTH

## 2014-06-27 LAB — CBC
HCT: 34.5 % — ABNORMAL LOW (ref 36.0–46.0)
HEMOGLOBIN: 10.9 g/dL — AB (ref 12.0–15.0)
MCH: 30.5 pg (ref 26.0–34.0)
MCHC: 31.6 g/dL (ref 30.0–36.0)
MCV: 96.6 fL (ref 78.0–100.0)
Platelets: 197 10*3/uL (ref 150–400)
RBC: 3.57 MIL/uL — ABNORMAL LOW (ref 3.87–5.11)
RDW: 14.8 % (ref 11.5–15.5)
WBC: 13.1 10*3/uL — ABNORMAL HIGH (ref 4.0–10.5)

## 2014-06-27 LAB — GLUCOSE, CAPILLARY
GLUCOSE-CAPILLARY: 104 mg/dL — AB (ref 70–99)
Glucose-Capillary: 137 mg/dL — ABNORMAL HIGH (ref 70–99)
Glucose-Capillary: 99 mg/dL (ref 70–99)
Glucose-Capillary: 139 mg/dL — ABNORMAL HIGH (ref 70–99)
Glucose-Capillary: 218 mg/dL — ABNORMAL HIGH (ref 70–99)
Glucose-Capillary: 133 mg/dL — ABNORMAL HIGH (ref 70–99)

## 2014-06-27 LAB — BODY FLUID CELL COUNT WITH DIFFERENTIAL
Lymphs, Fluid: 13 %
Monocyte-Macrophage-Serous Fluid: 51 % (ref 50–90)
Neutrophil Count, Fluid: 36 % — ABNORMAL HIGH (ref 0–25)
Total Nucleated Cell Count, Fluid: 60 cu mm (ref 0–1000)

## 2014-06-27 LAB — MAGNESIUM: Magnesium: 2.1 mg/dL (ref 1.5–2.5)

## 2014-06-27 LAB — PHOSPHORUS: Phosphorus: 3.1 mg/dL (ref 2.3–4.6)

## 2014-06-27 LAB — PROTEIN, BODY FLUID

## 2014-06-27 LAB — LACTATE DEHYDROGENASE: LDH: 207 U/L (ref 94–250)

## 2014-06-27 LAB — PROTEIN, TOTAL: TOTAL PROTEIN: 4.8 g/dL — AB (ref 6.0–8.3)

## 2014-06-27 LAB — LACTATE DEHYDROGENASE, PLEURAL OR PERITONEAL FLUID: LD, Fluid: 80 U/L — ABNORMAL HIGH (ref 3–23)

## 2014-06-27 MED ORDER — FUROSEMIDE 10 MG/ML IJ SOLN
60.0000 mg | Freq: Once | INTRAMUSCULAR | Status: AC
  Administered 2014-06-27: 60 mg via INTRAVENOUS
  Filled 2014-06-27: qty 6

## 2014-06-27 MED ORDER — POTASSIUM CHLORIDE 20 MEQ/15ML (10%) PO SOLN
20.0000 meq | ORAL | Status: AC
  Administered 2014-06-27 (×2): 20 meq
  Filled 2014-06-27 (×3): qty 15

## 2014-06-27 MED ORDER — POTASSIUM CHLORIDE 20 MEQ/15ML (10%) PO SOLN
40.0000 meq | Freq: Once | ORAL | Status: AC
  Administered 2014-06-27: 40 meq
  Filled 2014-06-27: qty 30

## 2014-06-27 MED ORDER — PANTOPRAZOLE SODIUM 40 MG PO PACK
40.0000 mg | PACK | Freq: Every day | ORAL | Status: DC
Start: 2014-06-27 — End: 2014-06-28
  Administered 2014-06-27: 40 mg
  Filled 2014-06-27 (×2): qty 20

## 2014-06-27 NOTE — Progress Notes (Signed)
[facility] ADULT ICU REPLACEMENT PROTOCOL FOR AM LAB REPLACEMENT ONLY  The patient does apply for the [facility] Adult ICU Electrolyte Replacment Protocol based on the criteria listed below:   1. Is GFR >/= 40 ml/min? Yes.    Patient's GFR today is >90 2. Is urine output >/= 0.5 ml/kg/hr for the last 6 hours? Yes.   Patient's UOP is 0.5 ml/kg/hr 3. Is BUN < 60 mg/dL? Yes.    Patient's BUN today is 19 4. Abnormal electrolyte(s): K+3.5 5. Ordered repletion with: protocol 6. If a panic level lab has been reported, has the CCM MD in charge been notified? No..   Physician:  Nicanor Bake [facility] 06/27/2014 6:12 AM

## 2014-06-27 NOTE — Progress Notes (Signed)
PULMONARY / CRITICAL CARE MEDICINE   Name: Elaine Le MRN: [id number] DOB: [birth year]    ADMISSION DATE:  06/21/2014 CONSULTATION DATE:  06/27/2014  REFERRING MD :  EDP  CHIEF COMPLAINT:  SOB  INITIAL PRESENTATION:  54 y.o. F, smoker,  admitted 2/24 with a 3 day hx of cough and SOB.  On arrival was found to have significant respiratory distress and hypoxia. Patient failed bipap and was intubated with a #6.5 ETT in ER, difficult intubation.   STUDIES:  CTA chest 2/24 >>> no PE, underlying emphysema.  Bilateral PNA, right hilar and mediastinal reactive lymphadenopathy.  SIGNIFICANT EVENTS: 2/24  Admit, intubated 2/25  Levo @ 10 mcg, sedate on vent.  Family concerned she has an 'allergy to albuterol'.   2/26  Weaned to 4 mcg levo, awake on vent, no acute events overnight, aspiration? 2/27 weaning levophed  SUBJECTIVE:   Tolerating PSV this am. Passed SBT but vts still marginal   VITAL SIGNS: Temp:  [97.4 F (36.3 C)-99.8 F (37.7 C)] 99.2 F (37.3 C) (03/01 0835) Pulse Rate:  [71-115] 79 (03/01 0800) Resp:  [14-23] 14 (03/01 0800) BP: (75-175)/(43-68) 95/50 mmHg (03/01 0800) SpO2:  [92 %-100 %] 96 % (03/01 0800) FiO2 (%):  [35 %-40 %] 35 % (03/01 0800) Weight:  [53.162 kg (117 lb 3.2 oz)] 53.162 kg (117 lb 3.2 oz) (03/01 0455)   HEMODYNAMICS: CVP:  [7 mmHg-10 mmHg] 9 mmHg   VENTILATOR SETTINGS: Vent Mode:  [-] PRVC FiO2 (%):  [35 %-40 %] 35 % Set Rate:  [14 bmp] 14 bmp Vt Set:  [450 mL] 450 mL PEEP:  [5 cmH20] 5 cmH20 Pressure Support:  [5 cmH20] 5 cmH20 Plateau Pressure:  [19 cmH20-23 cmH20] 20 cmH20   INTAKE / OUTPUT: Intake/Output      02/29 0701 - 03/01 0700 03/01 0701 - 03/02 0700   I.V. (mL/kg) 777.5 (14.6) 32.5 (0.6)   NG/GT 230 10   IV Piggyback 300    Total Intake(mL/kg) 1307.5 (24.6) 42.5 (0.8)   Urine (mL/kg/hr) 3000 (2.4) 75 (0.8)   Total Output 3000 75   Net -1692.5 -32.5          PHYSICAL EXAMINATION:  Gen: sedated on vent HEENT: NCAT  ETT in place PULM; bilateral rhonchi, exp wheeze CV: RRR, no mgr Ab: BS+, soft, nontender Ext: notable edema in hands and ankles Neuro: awake, move B UE, follows commands, strong cough  LABS:  PULMONARY  Recent Labs Lab 06/21/14 1840 06/21/14 2107 06/22/14 0350 06/24/14 1358  PHART 7.105* 7.092* 7.248* 7.438  PCO2ART 77.8* 63.8* 46.1* 36.9  PO2ART 162.0* 438.0* 69.3* 84.6  HCO3 23.4 18.9* 19.2* 24.5*  TCO2 22.2 18.5 17.8 22.1  O2SAT 97.2 98.4 90.2 96.6   CBC  Recent Labs Lab 06/25/14 0515 06/26/14 0440 06/27/14 0445  HGB 12.1 10.8* 10.9*  HCT 37.9 34.2* 34.5*  WBC 15.9* 11.9* 13.1*  PLT 204 166 197   CARDIAC  Recent Labs Lab 06/22/14 1050 06/22/14 1752 06/23/14 0108  TROPONINI 0.06* 0.05* 0.09*   No results for input(s): PROBNP in the last 168 hours.  CHEMISTRY  Recent Labs Lab 06/22/14 0435 06/23/14 0058 06/24/14 0415 06/25/14 0515 06/26/14 0440 06/27/14 0445  NA 129*  --  141 141 145 144  K 4.0  --  3.7 3.9 4.0 3.5  CL 109  --  112 109 105 103  CO2 20  --  24 29 37* 38*  GLUCOSE 175*  --  193* 127* 102* 122*  BUN 8  --  12 14 18 19   CREATININE 0.42*  --  0.37* 0.39* 0.40* 0.37*  CALCIUM 7.5*  --  7.8* 7.7* 8.0* 8.0*  MG 1.7 1.9 2.2 2.2 2.2 2.1  PHOS 2.6 1.8* 2.8 2.3 2.9 3.1   Estimated Creatinine Clearance: 67.5 mL/min (by C-G formula based on Cr of 0.37).  LIVER  Recent Labs Lab 06/21/14 1515  AST 39*  ALT 17  ALKPHOS 77  BILITOT 0.8  PROT 7.3  ALBUMIN 4.3   INFECTIOUS  Recent Labs Lab 06/21/14 1639 06/22/14 1050 06/23/14 0058 06/24/14 0415  LATICACIDVEN 0.85 1.6  --   --   PROCALCITON  --  0.29 0.25 0.10   ENDOCRINE CBG (last 3)   Recent Labs  06/26/14 2336 06/27/14 0327 06/27/14 0806  GLUCAP 104* 137* 99     IMAGING x48h Dg Chest Port 1 View  06/27/2014   CLINICAL DATA:  Pneumonia  EXAM: PORTABLE CHEST - 1 VIEW  COMPARISON:  Portable chest x-ray of June 26, 2014  FINDINGS: The lungs are well-expanded.  The interstitial markings remain diffusely increased. The hemidiaphragms are obscured. The cardiac silhouette is normal in size. The pulmonary vascularity is indistinct. The endotracheal tube tip lies 3 cm above the carina. The esophagogastric tube tip projects below the inferior margin of the image. The left internal jugular venous catheter tip projects over the midportion of the SVC.  IMPRESSION: Further deterioration in the appearance of the pulmonary interstitium since yesterday's study. Pneumonia, pulmonary edema, and ARDS remain the leading entities in the differential diagnosis. The support tubes and lines are in appropriate position radiographically.   Electronically Signed   By: David  Martinique   On: 06/27/2014 07:22   Dg Chest Port 1 View  06/26/2014   CLINICAL DATA:  Acute respiratory failure with hypoxia  EXAM: PORTABLE CHEST - 1 VIEW  COMPARISON:  June 25, 2014  FINDINGS: Endotracheal tube tip is 4.5 cm above the carina. Nasogastric tube tip and side port are below the diaphragm. Central catheter tip is in the superior cava. No pneumothorax. There is diffuse interstitial edema with patchy alveolar opacity throughout both lungs. There is a small right pleural effusion. The appearance is stable. Heart size and pulmonary vascularity are normal. No adenopathy.  IMPRESSION: Tube and catheter positions as described without pneumothorax. Interstitial and patchy alveolar opacity diffusely. Suspect multifocal pneumonia. Atypical congestive heart failure and/or ARDS superimposed also are considerations. More than one of these entities may well exist concurrently. There is a small right effusion.   Electronically Signed   By: Lowella Grip III M.D.   On: 06/26/2014 07:24     ASSESSMENT / PLAN:  PULMONARY OETT 2/24 (size 6.5, difficult airway needing bougie) >> A: Acute Hypoxic Respiratory failure - in setting of AECOPD + Flu A + CAP; some pulmonary edema  2/28 DIFFICULT AIRWAY - required bougie  & 6.5 ETT per EDP notes Tobacco Abuse - smoker, no baseline PFT's Bilateral R>L pleural effusions  P:   Daily SBT / WUA,  Right thora today. Should be ready for extubation either later this afternoon or tomorrow PRVC, wean PEEP / FiO2 for sats > 90% DuoNebs / Albuterol  Maintain Solumedrol 60 daily-->start taper 3/2 Cont lasix  Trend CXR / ABG Nicotine patch Set up f/u with [facility] on discharge for PFT's and management of presumed COPD.  CARDIOVASCULAR CVL L IJ 2/24 >>> A:  Septic Shock resolved but still some hypotension 2/28 and 2/29 likely due to  sedation (nurses note more hypotension to fentanyl than to propofol) P:  Levophed for goal  MAP > 65 Wean fentanyl gtt due to BP issues Check CVP > goal is ~ 4 when off pressors CVP q shift  RENAL A:   Hyponatremia Hypophosphatemia  Hypomagnesemia  P:   KVO fluids Monitor BMET and UOP Replace electrolytes as needed  GASTROINTESTINAL A:   GERD Nutrition Hx diverticulosis, internal hemorrhoids P:   Pantoprazole Continue TF > held 2/29 am for high residuals; will restart this pm and attempt to advance  HEMATOLOGIC A:   VTE Prophylaxis P:  SCD's / Lovenox CBC in AM  INFECTIOUS BCx2 2/24 >>neg  UCx 2/24 >>neg  Sputum 2/24 >>neg  RVP 2/24 >> INFLUENZA A U. Strep 2/25 >> neg U. Legionella 2/25 >>neg Right pleural fluid 3/1>>> A:   CAP  P:   Abx: Ceftriaxone, start date 2/24, day 7/7 Abx: Azithromycin, start date 2/24, day 7/7  Antiviral Tamiflu 2/25 >> 2/29   ENDOCRINE A:   No known issues  P:   SSI if glucose consistently > 180.  NEUROLOGIC A:   Acute Metabolic Encephalopathy - in setting of hypercapnia  Hx Fibromyalgia, cluster headaches, back pain P:   Sedation:PRN fent RASS goal: -1 Daily WUA.   Family updated: Sister, father, brother updated at bedside at length 2/28 by Dr Lake Bells, father and brother updated by Dr Lamonte Sakai on 3/1  NP discussion Looks good. Passed SBT but volumes  marginal. She has significant bilateral effusions. R>L. Think we are compelled to drain these. Will go ahead and cont rx as outlined above, but also tap the right effusion. Hope that this will also facilitate weaning. She is close to being ready for extubation. May even be able to do this later today or tomorrow at [place] ACNP-BC [place] Pager # [phone] OR # [phone] if no answer   Attending Note:  I have examined patient, reviewed labs, studies and notes. I have discussed the case with Jerrye Bushy, and I agree with the data and plans as amended above. Pt with VDRF in setting COPD, Flu A, Pneumonia. On exam she is more comfortable 3/1, is tolerating PSV with better Vt's and RR. She is approaching extubation. She has B effusions and I believe that thoracentesis will help facilitate the extubation. Will perform today and then assess on SBT. Discussed w patient and her family at bedside. Independent critical care time is 45 minutes.   Baltazar Apo, MD, PhD 06/27/2014, 10:43 AM [facility] [phone] or if no answer [phone]

## 2014-06-27 NOTE — Progress Notes (Signed)
NUTRITION FOLLOW UP  Intervention:   - Continue trickle feeds of Vital HP @ 10 mL//hr. Advance per MD.  - Once medically able, recommend increasing TF by 10 mL/hr to goal rate of 50 ml/hr providing 1200 kcal, 105 g protein(100% of protein needs) and 1008 ml free water  Nutrition Dx:   Inadequate oral intake related to inability to eat as evidenced by NPO status; ongoing.  Goal:   Pt to meet >/= 90% of estimated needs; progressing.  Monitor:   TF tolerance, weight trends, labs  Assessment:   Pt admitted with SOB and cough x 3 days. Pt is a smoker, diagnosed with COPD. PMH significant for GERD, fibromyalgia, IBS, headaches, internal hemorroids and hiatal hernia.   Patient is currently intubated on ventilator support MV: 7.5 L/min Temp (24hrs), Avg:98.6 F (37 C), Min:97.4 F (36.3 C), Max:99.8 F (37.7 C)  Propofol: none  TF stopped 2/29 due to high residuals. Resumed trickle feeds today. Currently pt receiving Vital HP @ 10 ml/hr to provide 240 kcal and 21 g protein.   Per MD note pt may be ready for extubation later this afternoon or tomorrow.  Pt to have right thoracentesis today.   Labs reviewed  Height: Ht Readings from Last 1 Encounters:  06/21/14 5\' 4"  (1.626 m)    Weight Status:   Wt Readings from Last 1 Encounters:  06/27/14 117 lb 3.2 oz (53.162 kg)    Re-estimated needs:  Kcal: 1258 kcals Protein: 90-100 g protein Fluid: >/= 1.5 L/day  Skin: Pink, mottled, intact  Diet Order: Diet NPO time specified   Intake/Output Summary (Last 24 hours) at 06/27/14 1055 Last data filed at 06/27/14 0800  Gross per 24 hour  Intake 1233.13 ml  Output   2950 ml  Net -1716.87 ml    Last BM: 2/25   Labs:   Recent Labs Lab 06/25/14 0515 06/26/14 0440 06/27/14 0445  NA 141 145 144  K 3.9 4.0 3.5  CL 109 105 103  CO2 29 37* 38*  BUN 14 18 19   CREATININE 0.39* 0.40* 0.37*  CALCIUM 7.7* 8.0* 8.0*  MG 2.2 2.2 2.1  PHOS 2.3 2.9 3.1  GLUCOSE 127* 102*  122*    CBG (last 3)   Recent Labs  06/26/14 2336 06/27/14 0327 06/27/14 0806  GLUCAP 104* 137* 99    Scheduled Meds: . antiseptic oral rinse  7 mL Mouth Rinse QID  . azithromycin  500 mg Intravenous Q24H  . cefTRIAXone (ROCEPHIN)  IV  1 g Intravenous Q24H  . chlorhexidine  15 mL Mouth Rinse BID  . enoxaparin (LOVENOX) injection  40 mg Subcutaneous Q24H  . furosemide  60 mg Intravenous Once  . insulin aspart  0-15 Units Subcutaneous 6 times per day  . ipratropium-albuterol  3 mL Nebulization Q4H  . methylPREDNISolone (SOLU-MEDROL) injection  60 mg Intravenous Daily  . nicotine  21 mg Transdermal Daily  . pantoprazole sodium  40 mg Per Tube Q1200  . potassium chloride  20 mEq Per Tube Q4H  . potassium chloride  40 mEq Per Tube Once    Continuous Infusions: . feeding supplement (VITAL HIGH PROTEIN) 1,000 mL (06/26/14 1900)    Elmer Picker MS Dietetic Intern Pager Number [phone]

## 2014-06-27 NOTE — Progress Notes (Signed)
RT with RN tech changed ETT tube holder- uneventful. Resulted in ETT 24 lip, bilateral breath sounds and Sp02 95%. RN aware.

## 2014-06-27 NOTE — Progress Notes (Signed)
Wasted in sink fentanyl drip ,195 ccs of fentanyl 2500 mcg in 250cc;  Witness Amy Tommie Raymond, Beverly Gust, RN

## 2014-06-27 NOTE — Procedures (Signed)
Thoracentesis Procedure Note  Pre-operative Diagnosis: pleural effusion   Post-operative Diagnosis: same  Indications: evaluation and symptom improvement  Procedure Details  Consent: Informed consent was obtained. Risks of the procedure were discussed including: infection, bleeding, pain, pneumothorax.  Under sterile conditions the patient was positioned. Betadine solution and sterile drapes were utilized.  1% buffered lidocaine was used to anesthetize the pleural  Space which was identified via real time [place]. Fluid was obtained without any difficulties and minimal blood loss.  A dressing was applied to the wound and wound care instructions were provided.   Findings 1000 ml of clear pleural fluid was obtained. A sample was sent to Pathology for  cell counts, as well as for infection analysis.  Complications:  None; patient tolerated the procedure well.          Condition: stable  Plan A follow up chest x-ray was ordered. Bed Rest for 0 hours. Tylenol 650 mg. for pain.     Baltazar Apo, MD, PhD 06/28/2014, 9:51 AM [facility] [phone] or if no answer [phone]

## 2014-06-28 ENCOUNTER — Inpatient Hospital Stay (HOSPITAL_COMMUNITY): Payer: Medicare Other

## 2014-06-28 DIAGNOSIS — J90 Pleural effusion, not elsewhere classified: Secondary | ICD-10-CM

## 2014-06-28 DIAGNOSIS — J9601 Acute respiratory failure with hypoxia: Secondary | ICD-10-CM

## 2014-06-28 LAB — BLOOD GAS, ARTERIAL
Acid-Base Excess: 8.4 mmol/L — ABNORMAL HIGH (ref 0.0–2.0)
Bicarbonate: 33.8 mEq/L — ABNORMAL HIGH (ref 20.0–24.0)
DRAWN BY: 295031
FIO2: 0.35 %
LHR: 14 {breaths}/min
MECHVT: 450 mL
O2 SAT: 92.2 %
PEEP/CPAP: 5 cmH2O
PO2 ART: 68.4 mmHg — AB (ref 80.0–100.0)
Patient temperature: 98.6
TCO2: 30.1 mmol/L (ref 0–100)
pCO2 arterial: 51.3 mmHg — ABNORMAL HIGH (ref 35.0–45.0)
pH, Arterial: 7.434 (ref 7.350–7.450)

## 2014-06-28 LAB — GLUCOSE, CAPILLARY
GLUCOSE-CAPILLARY: 129 mg/dL — AB (ref 70–99)
GLUCOSE-CAPILLARY: 140 mg/dL — AB (ref 70–99)
GLUCOSE-CAPILLARY: 124 mg/dL — AB (ref 70–99)
Glucose-Capillary: 119 mg/dL — ABNORMAL HIGH (ref 70–99)
Glucose-Capillary: 141 mg/dL — ABNORMAL HIGH (ref 70–99)
Glucose-Capillary: 147 mg/dL — ABNORMAL HIGH (ref 70–99)

## 2014-06-28 LAB — TRIGLYCERIDES: Triglycerides: 115 mg/dL (ref <150)

## 2014-06-28 MED ORDER — FENTANYL CITRATE 0.05 MG/ML IJ SOLN
INTRAMUSCULAR | Status: AC
  Administered 2014-06-28: 100 ug
  Filled 2014-06-28: qty 4

## 2014-06-28 MED ORDER — POTASSIUM CHLORIDE 10 MEQ/100ML IV SOLN
10.0000 meq | INTRAVENOUS | Status: AC
  Administered 2014-06-28 (×4): 10 meq via INTRAVENOUS
  Filled 2014-06-28 (×4): qty 100

## 2014-06-28 MED ORDER — CHLORHEXIDINE GLUCONATE 0.12 % MT SOLN: 15.0000 mL | Freq: Two times a day (BID) | OROMUCOSAL | Status: DC

## 2014-06-28 MED ORDER — SODIUM CHLORIDE 0.9 % IV BOLUS (SEPSIS)
500.0000 mL | Freq: Once | INTRAVENOUS | Status: AC
  Administered 2014-06-28: 500 mL via INTRAVENOUS

## 2014-06-28 MED ORDER — FUROSEMIDE 10 MG/ML IJ SOLN
60.0000 mg | Freq: Two times a day (BID) | INTRAMUSCULAR | Status: DC
  Administered 2014-06-28: 60 mg via INTRAVENOUS
  Filled 2014-06-28: qty 6

## 2014-06-28 MED ORDER — MIDAZOLAM HCL 2 MG/2ML IJ SOLN: 2.0000 mg | INTRAMUSCULAR | Status: DC | PRN

## 2014-06-28 MED ORDER — CETYLPYRIDINIUM CHLORIDE 0.05 % MT LIQD
7.0000 mL | Freq: Four times a day (QID) | OROMUCOSAL | Status: DC
  Administered 2014-06-28 – 2014-07-05 (×24): 7 mL via OROMUCOSAL

## 2014-06-28 MED ORDER — PROPOFOL 10 MG/ML IV EMUL
0.0000 ug/kg/min | INTRAVENOUS | Status: DC
  Administered 2014-06-28: 10 ug/kg/min via INTRAVENOUS
  Administered 2014-06-28: 20 ug/kg/min via INTRAVENOUS
  Filled 2014-06-28 (×3): qty 100

## 2014-06-28 MED ORDER — FENTANYL BOLUS VIA INFUSION
50.0000 ug | INTRAVENOUS | Status: DC | PRN
  Filled 2014-06-28: qty 50

## 2014-06-28 MED ORDER — ROCURONIUM BROMIDE 50 MG/5ML IV SOLN
INTRAVENOUS | Status: AC
  Administered 2014-06-28: 5 mg
  Filled 2014-06-28: qty 2

## 2014-06-28 MED ORDER — MIDAZOLAM HCL 2 MG/2ML IJ SOLN
INTRAMUSCULAR | Status: AC
  Administered 2014-06-28: 2 mg
  Filled 2014-06-28: qty 4

## 2014-06-28 MED ORDER — PANTOPRAZOLE SODIUM 40 MG IV SOLR
40.0000 mg | Freq: Every day | INTRAVENOUS | Status: DC
  Administered 2014-06-28 – 2014-06-30 (×3): 40 mg via INTRAVENOUS
  Filled 2014-06-28 (×3): qty 40

## 2014-06-28 MED ORDER — FUROSEMIDE 10 MG/ML IJ SOLN
60.0000 mg | Freq: Once | INTRAMUSCULAR | Status: AC
  Administered 2014-06-28: 60 mg via INTRAVENOUS
  Filled 2014-06-28: qty 6

## 2014-06-28 MED ORDER — LIDOCAINE HCL (CARDIAC) 20 MG/ML IV SOLN
INTRAVENOUS | Status: AC
  Filled 2014-06-28: qty 5

## 2014-06-28 MED ORDER — FENTANYL CITRATE 0.05 MG/ML IJ SOLN: 50.0000 ug | Freq: Once | INTRAMUSCULAR | Status: DC

## 2014-06-28 MED ORDER — ETOMIDATE 2 MG/ML IV SOLN
INTRAVENOUS | Status: AC
  Administered 2014-06-28: 20 mg
  Filled 2014-06-28: qty 20

## 2014-06-28 MED ORDER — SUCCINYLCHOLINE CHLORIDE 20 MG/ML IJ SOLN
INTRAMUSCULAR | Status: AC
  Filled 2014-06-28: qty 1

## 2014-06-28 MED ORDER — SODIUM CHLORIDE 0.9 % IV SOLN
INTRAVENOUS | Status: DC
  Administered 2014-06-28 (×2): 500 mL via INTRAVENOUS
  Administered 2014-07-01: 15:00:00 via INTRAVENOUS

## 2014-06-28 MED ORDER — VITAL HIGH PROTEIN PO LIQD
1000.0000 mL | ORAL | Status: DC
  Filled 2014-06-28 (×3): qty 1000

## 2014-06-28 MED ORDER — METHYLPREDNISOLONE SODIUM SUCC 40 MG IJ SOLR
40.0000 mg | Freq: Every day | INTRAMUSCULAR | Status: DC
  Administered 2014-06-28 – 2014-06-30 (×3): 40 mg via INTRAVENOUS
  Filled 2014-06-28 (×3): qty 1

## 2014-06-28 MED ORDER — SODIUM CHLORIDE 0.9 % IV SOLN
25.0000 ug/h | INTRAVENOUS | Status: DC
  Administered 2014-06-28: 50 ug/h via INTRAVENOUS
  Filled 2014-06-28: qty 50

## 2014-06-28 NOTE — Progress Notes (Signed)
eLink Physician-Brief Progress Note Patient Name: Elaine Le DOB: 11-Feb-1961 MRN: [id number]   Date of Service  06/28/2014  HPI/Events of Note  Called d/t BP = 85/49 with MAP = 58. Patient being actively diuresed with Lasix 40 mg IV BID.  eICU Interventions  Will order:       1. 0.9 NaCl 500 mL IV over 30 minutes now.      2. Hold Lasix dose for MAP < 65.     Intervention Category Intermediate Interventions: Hypotension - evaluation and management  Alba Kriesel Eugene 06/28/2014, 10:10 PM

## 2014-06-28 NOTE — Procedures (Signed)
Extubation Procedure Note  Patient Details:   Name: KIELA SHISLER DOB: 03/06/1961 MRN: [id number]   Airway Documentation:     Evaluation  O2 sats: 47 Complications:none Patient tolerated procedure well. Bilateral Breath Sounds: Rhonchi Suctioning: Airway Able to speak in a whisper  Per CCM order pt extubated, placed on 4L nasal cannula.  Pt tolerated well.  Martha Clan 06/28/2014, 9:27 AM

## 2014-06-28 NOTE — Progress Notes (Signed)
PULMONARY / CRITICAL CARE MEDICINE   Name: Elaine Le MRN: [id number] DOB: 24-Jan-1961    ADMISSION DATE:  06/21/2014 CONSULTATION DATE:  06/28/2014  REFERRING MD :  EDP  CHIEF COMPLAINT:  SOB  INITIAL PRESENTATION:  54 y.o. F, smoker,  admitted 2/24 with a 3 day hx of cough and SOB.  On arrival was found to have significant respiratory distress and hypoxia. Patient failed bipap and was intubated with a #6.5 ETT in ER, difficult intubation.   STUDIES:  CTA chest 2/24 >>> no PE, underlying emphysema.  Bilateral PNA, right hilar and mediastinal reactive lymphadenopathy.  SIGNIFICANT EVENTS: 2/24  Admit, intubated 2/25  Levo @ 10 mcg, sedate on vent.  Family concerned she has an 'allergy to albuterol'.   2/26  Weaned to 4 mcg levo, awake on vent, no acute events overnight, aspiration? 2/27 weaning levophed 3/1 right thoracentesis -->transudate   SUBJECTIVE:   Passed SBT this am  VITAL SIGNS: Temp:  [97.6 F (36.4 C)-98.9 F (37.2 C)] 98.9 F (37.2 C) (03/02 0423) Pulse Rate:  [72-109] 79 (03/02 0600) Resp:  [14-28] 17 (03/02 0600) BP: (91-137)/(44-68) 111/65 mmHg (03/02 0600) SpO2:  [92 %-100 %] 100 % (03/02 0739) FiO2 (%):  [30 %-35 %] 30 % (03/02 0739) Weight:  [112.9 kg (248 lb 14.4 oz)] 112.9 kg (248 lb 14.4 oz) (03/02 0616)   HEMODYNAMICS: CVP:  [6 mmHg-8 mmHg] 7 mmHg   VENTILATOR SETTINGS: Vent Mode:  [-] PRVC FiO2 (%):  [30 %-35 %] 30 % Set Rate:  [14 bmp] 14 bmp Vt Set:  [450 mL] 450 mL PEEP:  [5 cmH20] 5 cmH20 Pressure Support:  [5 cmH20-10 cmH20] 10 cmH20 Plateau Pressure:  [19 cmH20-27 cmH20] 20 cmH20   INTAKE / OUTPUT: Intake/Output      03/01 0701 - 03/02 0700 03/02 0701 - 03/03 0700   I.V. (mL/kg) 39.8 (0.4)    Other 190    NG/GT 640    IV Piggyback 300    Total Intake(mL/kg) 1169.8 (10.4)    Urine (mL/kg/hr) 1975 (0.7)    Emesis/NG output 1000 (0.4)    Total Output 2975     Net -1805.2            PHYSICAL EXAMINATION:  Gen: on vent,  arousable to voice HEENT: NCAT ETT in place PULM: crackles in bases  CV: RRR, +murmur at apex Ab: BS+, soft, nontender Ext: notable edema in bilateral hands  Neuro: awake, follows commands, equal strength bilaterally, strong cough, can lift head off pillow   LABS:  PULMONARY  Recent Labs Lab 06/21/14 1840 06/21/14 2107 06/22/14 0350 06/24/14 1358  PHART 7.105* 7.092* 7.248* 7.438  PCO2ART 77.8* 63.8* 46.1* 36.9  PO2ART 162.0* 438.0* 69.3* 84.6  HCO3 23.4 18.9* 19.2* 24.5*  TCO2 22.2 18.5 17.8 22.1  O2SAT 97.2 98.4 90.2 96.6   CBC  Recent Labs Lab 06/25/14 0515 06/26/14 0440 06/27/14 0445  HGB 12.1 10.8* 10.9*  HCT 37.9 34.2* 34.5*  WBC 15.9* 11.9* 13.1*  PLT 204 166 197   CARDIAC  Recent Labs Lab 06/22/14 1050 06/22/14 1752 06/23/14 0108  TROPONINI 0.06* 0.05* 0.09*   No results for input(s): PROBNP in the last 168 hours.  CHEMISTRY  Recent Labs Lab 06/22/14 0435 06/23/14 0058 06/24/14 0415 06/25/14 0515 06/26/14 0440 06/27/14 0445  NA 129*  --  141 141 145 144  K 4.0  --  3.7 3.9 4.0 3.5  CL 109  --  112 109 105 103  CO2  20  --  24 29 37* 38*  GLUCOSE 175*  --  193* 127* 102* 122*  BUN 8  --  12 14 18 19   CREATININE 0.42*  --  0.37* 0.39* 0.40* 0.37*  CALCIUM 7.5*  --  7.8* 7.7* 8.0* 8.0*  MG 1.7 1.9 2.2 2.2 2.2 2.1  PHOS 2.6 1.8* 2.8 2.3 2.9 3.1   Estimated Creatinine Clearance: 99 mL/min (by C-G formula based on Cr of 0.37).  LIVER  Recent Labs Lab 06/21/14 1515 06/27/14 0445  AST 39*  --   ALT 17  --   ALKPHOS 77  --   BILITOT 0.8  --   PROT 7.3 4.8*  ALBUMIN 4.3  --    INFECTIOUS  Recent Labs Lab 06/21/14 1639 06/22/14 1050 06/23/14 0058 06/24/14 0415  LATICACIDVEN 0.85 1.6  --   --   PROCALCITON  --  0.29 0.25 0.10   ENDOCRINE CBG (last 3)   Recent Labs  06/28/14 0020 06/28/14 0418 06/28/14 0758  GLUCAP 147* 129* 140*     IMAGING x48h Dg Chest Port 1 View  06/27/2014   CLINICAL DATA:  Post right  thoracentesis  EXAM: PORTABLE CHEST - 1 VIEW  COMPARISON:  06/27/2014  FINDINGS: Cardiomediastinal silhouette is stable. Stable endotracheal and NG tube position. Stable left IJ central line position. Persistent interstitial prominence bilaterally highly suspicious for interstitial edema or pneumonitis. Improvement in aeration in right base with significant decrease in right pleural effusion. No pneumothorax. Trace left basilar atelectasis.  IMPRESSION: Stable support apparatus. Again noted interstitial prominence bilaterally suspicious for interstitial edema or pneumonitis. Improvement in aeration right base with significant decrease in right pleural effusion. No pneumothorax.   Electronically Signed   By: Lahoma Crocker M.D.   On: 06/27/2014 11:44   Dg Chest Port 1 View  06/27/2014   CLINICAL DATA:  Pneumonia  EXAM: PORTABLE CHEST - 1 VIEW  COMPARISON:  Portable chest x-ray of June 26, 2014  FINDINGS: The lungs are well-expanded. The interstitial markings remain diffusely increased. The hemidiaphragms are obscured. The cardiac silhouette is normal in size. The pulmonary vascularity is indistinct. The endotracheal tube tip lies 3 cm above the carina. The esophagogastric tube tip projects below the inferior margin of the image. The left internal jugular venous catheter tip projects over the midportion of the SVC.  IMPRESSION: Further deterioration in the appearance of the pulmonary interstitium since yesterday's study. Pneumonia, pulmonary edema, and ARDS remain the leading entities in the differential diagnosis. The support tubes and lines are in appropriate position radiographically.   Electronically Signed   By: David  Martinique   On: 06/27/2014 07:22     ASSESSMENT / PLAN:  PULMONARY OETT 2/24 (size 6.5, difficult airway needing bougie) >> A: Acute Hypoxic Respiratory failure - in setting of AECOPD + Flu A + CAP; some pulmonary edema  2/28 DIFFICULT AIRWAY - required bougie & 6.5 ETT per EDP  notes Tobacco Abuse - smoker, no baseline PFT's Bilateral R>L pleural effusions -->right transudate by light's criteria  Passed SBT. + cuff leak  Fluid analysis from [facility] was c/w transudate  P:   Extubate  Wean FIO2 Taper steroids  Re-eval lasix needs daily  Trend CXR  Nicotine patch Set up f/u with [facility] on discharge for PFT's and management of presumed COPD.  CARDIOVASCULAR CVL L IJ 2/24 >>> A:  Septic Shock resolved but still some hypotension 2/28 and 2/29 likely due to sedation (nurses note more hypotension to fentanyl than to  propofol)-->resolved  P:  Allow neg NET goal if BP will allow  CVP q shift  RENAL A:   Hyponatremia Hypophosphatemia  Hypomagnesemia  P:   KVO fluids Monitor BMET and UOP Repeat & Replace electrolytes as needed  GASTROINTESTINAL A:   GERD Nutrition Hx diverticulosis, internal hemorrhoids P:   Stress ulcer prophylaxis - Pantoprazole Bowel regimen - senna Currently tolerating tube feeds. Will hold for possible extubation & re-assess   HEMATOLOGIC A:   VTE Prophylaxis P:  SCD's / Lovenox CBC in AM  INFECTIOUS BCx2 2/24 >>neg  UCx 2/24 >>neg  Sputum 2/24 >>neg  RVP 2/24 >> INFLUENZA A U. Strep 2/25 >> neg U. Legionella 2/25 >>neg Right pleural fluid 3/1>>> no organisms  A:   CAP  P:   Abx: Ceftriaxone, start date 2/24, day 7/7 Abx: Azithromycin, start date 2/24, day 7/7 Antiviral Tamiflu 2/25 >> 2/29 Completed abx, trend fever and WBC curve   ENDOCRINE A:   No known issues  P:   SSI if glucose consistently > 180.  NEUROLOGIC A:   Acute Metabolic Encephalopathy - in setting of hypercapnia  Hx Fibromyalgia, cluster headaches, back pain P:   D/c PAD protocol s/p extubation  Supportive care    Family updated: Sister, father, brother updated at bedside at length 2/28 by Dr Lake Bells, father and brother updated by Dr Lamonte Sakai on 3/1  NP discussion Passed SBT, RSBI <100. Right pleural effusion drained  yesterday. Minimal secretions overnight. Awake & following commands. CVP maintained >4, patient off pressors and off sedation/analgesia. Feel patient is ready for extubation today.    Attending Note:  I have examined patient, reviewed labs, studies and notes. I have discussed the case with Jerrye Bushy, and I agree with the data and plans as amended above. Hx Flu A + CAP in setting severe COPD. She is tolerating SBT this am, still has insp crackles on exam and vascular / interstitial pattern on CXR. I believe she is a good candidate for extubation today based on her SBT performance. Will also give lasix empirically. Independent critical care time is 50 minutes.   Baltazar Apo, MD, PhD 06/28/2014, 9:51 AM  Pulmonary and Critical Care [phone] or if no answer [phone]

## 2014-06-28 NOTE — Progress Notes (Signed)
NUTRITION FOLLOW UP  Intervention:   - Diet advancement per MD - Once diet advanced, will add Ensure Complete po BID, each supplement provides 350 kcal and 13 grams of protein - RD will continue to monitor  Nutrition Dx:   Inadequate oral intake related to inability to eat as evidenced by NPO status; ongoing.  Goal:   Pt to meet >/= 90% of estimated needs; not met  Monitor:   Diet advancement, weight trends, labs  Assessment:   Pt admitted with SOB and cough x 3 days. Pt is a smoker, diagnosed with COPD. PMH significant for GERD, fibromyalgia, IBS, headaches, internal hemorroids and hiatal hernia.   - Pt extubated this morning. Per RN, pt may need to be re-intubated. - TF stopped for extubation. Pt currently NPO. Reports that her usual body weight is 103 lbs. She was eating well prior to admission.  - RD will follow for diet advancement.  - Labs reviewed   Height: Ht Readings from Last 1 Encounters:  06/21/14 5' 4"  (1.626 m)    Weight Status:   Wt Readings from Last 1 Encounters:  06/28/14 248 lb 14.4 oz (112.9 kg)   Re-estimated needs:  Kcal: 1400-1600 kcals Protein: 90-100 g protein Fluid: >/= 1.5 L/day  Skin: intact  Diet Order: Diet NPO time specified   Intake/Output Summary (Last 24 hours) at 06/28/14 1018 Last data filed at 06/28/14 1009  Gross per 24 hour  Intake   1261 ml  Output   2975 ml  Net  -1714 ml    Last BM: 2/25   Labs:   Recent Labs Lab 06/25/14 0515 06/26/14 0440 06/27/14 0445  NA 141 145 144  K 3.9 4.0 3.5  CL 109 105 103  CO2 29 37* 38*  BUN 14 18 19   CREATININE 0.39* 0.40* 0.37*  CALCIUM 7.7* 8.0* 8.0*  MG 2.2 2.2 2.1  PHOS 2.3 2.9 3.1  GLUCOSE 127* 102* 122*    CBG (last 3)   Recent Labs  06/28/14 0020 06/28/14 0418 06/28/14 0758  GLUCAP 147* 129* 140*    Scheduled Meds: . antiseptic oral rinse  7 mL Mouth Rinse QID  . azithromycin  500 mg Intravenous Q24H  . cefTRIAXone (ROCEPHIN)  IV  1 g Intravenous  Q24H  . chlorhexidine  15 mL Mouth Rinse BID  . enoxaparin (LOVENOX) injection  40 mg Subcutaneous Q24H  . insulin aspart  0-15 Units Subcutaneous 6 times per day  . ipratropium-albuterol  3 mL Nebulization Q4H  . methylPREDNISolone (SOLU-MEDROL) injection  40 mg Intravenous Daily  . nicotine  21 mg Transdermal Daily  . pantoprazole sodium  40 mg Per Tube Q1200  . potassium chloride  10 mEq Intravenous Q1 Hr x 4    Continuous Infusions:    Laurette Schimke MS, [place], LDN [phone]

## 2014-06-28 NOTE — Procedures (Signed)
Intubation Procedure Note Elaine Le [id number] 07-Sep-1960  Procedure: Intubation Indications: Respiratory insufficiency  Procedure Details Consent: Risks of procedure as well as the alternatives and risks of each were explained to the (patient/caregiver).  Consent for procedure obtained. Time Out: Verified patient identification, verified procedure, site/side was marked, verified correct patient position, special equipment/implants available, medications/allergies/relevent history reviewed, required imaging and test results available.  Performed  Maximum sterile technique was used including antiseptics, cap, gloves, hand hygiene and mask.  MAC #3 glide scope # 7 ETT Excellent view of vocal cords.   Evaluation Hemodynamic Status: BP dropped after intubation, treated w/ IV fluid bolus; O2 sats: stable throughout Patient's Current Condition: stable Complications: No apparent complications Patient did tolerate procedure well. Chest X-ray ordered to verify placement.  CXR: pending.   BABCOCK,PETE 06/28/2014  Baltazar Apo, MD, PhD 06/29/2014, 9:32 AM [place] [phone] or if no answer [phone]

## 2014-06-29 ENCOUNTER — Inpatient Hospital Stay (HOSPITAL_COMMUNITY): Payer: Medicare Other

## 2014-06-29 DIAGNOSIS — J101 Influenza due to other identified influenza virus with other respiratory manifestations: Secondary | ICD-10-CM | POA: Diagnosis present

## 2014-06-29 HISTORY — DX: Influenza due to other identified influenza virus with other respiratory manifestations: J10.1

## 2014-06-29 LAB — BLOOD GAS, ARTERIAL
Acid-Base Excess: 8.4 mmol/L — ABNORMAL HIGH (ref 0.0–2.0)
BICARBONATE: 32.1 meq/L — AB (ref 20.0–24.0)
DRAWN BY: 31814
FIO2: 0.4 %
MECHVT: 450 mL
O2 SAT: 97.9 %
PATIENT TEMPERATURE: 98.6
PEEP/CPAP: 5 cmH2O
PO2 ART: 107 mmHg — AB (ref 80.0–100.0)
RATE: 14 resp/min
TCO2: 28.9 mmol/L (ref 0–100)
pCO2 arterial: 41.7 mmHg (ref 35.0–45.0)
pH, Arterial: 7.499 — ABNORMAL HIGH (ref 7.350–7.450)

## 2014-06-29 LAB — CBC
HEMATOCRIT: 34.8 % — AB (ref 36.0–46.0)
Hemoglobin: 10.9 g/dL — ABNORMAL LOW (ref 12.0–15.0)
MCH: 29.9 pg (ref 26.0–34.0)
MCHC: 31.3 g/dL (ref 30.0–36.0)
MCV: 95.6 fL (ref 78.0–100.0)
Platelets: 313 10*3/uL (ref 150–400)
RBC: 3.64 MIL/uL — ABNORMAL LOW (ref 3.87–5.11)
RDW: 14.8 % (ref 11.5–15.5)
WBC: 14.6 10*3/uL — ABNORMAL HIGH (ref 4.0–10.5)

## 2014-06-29 LAB — GLUCOSE, CAPILLARY
GLUCOSE-CAPILLARY: 157 mg/dL — AB (ref 70–99)
GLUCOSE-CAPILLARY: 149 mg/dL — AB (ref 70–99)
Glucose-Capillary: 112 mg/dL — ABNORMAL HIGH (ref 70–99)
Glucose-Capillary: 145 mg/dL — ABNORMAL HIGH (ref 70–99)
Glucose-Capillary: 178 mg/dL — ABNORMAL HIGH (ref 70–99)
Glucose-Capillary: 83 mg/dL (ref 70–99)

## 2014-06-29 LAB — BASIC METABOLIC PANEL
Anion gap: 8 (ref 5–15)
BUN: 22 mg/dL (ref 6–23)
CALCIUM: 8.1 mg/dL — AB (ref 8.4–10.5)
CO2: 33 mmol/L — ABNORMAL HIGH (ref 19–32)
Chloride: 99 mmol/L (ref 96–112)
Creatinine, Ser: 0.38 mg/dL — ABNORMAL LOW (ref 0.50–1.10)
GFR calc Af Amer: >90 mL/min (ref >90)
Glucose, Bld: 133 mg/dL — ABNORMAL HIGH (ref 70–99)
Potassium: 3.1 mmol/L — ABNORMAL LOW (ref 3.5–5.1)
Sodium: 140 mmol/L (ref 135–145)

## 2014-06-29 LAB — PHOSPHORUS: Phosphorus: 3 mg/dL (ref 2.3–4.6)

## 2014-06-29 LAB — MAGNESIUM: MAGNESIUM: 1.9 mg/dL (ref 1.5–2.5)

## 2014-06-29 MED ORDER — NOREPINEPHRINE BITARTRATE 1 MG/ML IV SOLN
2.0000 ug/min | INTRAVENOUS | Status: DC
  Administered 2014-06-29: 2 ug/min via INTRAVENOUS
  Filled 2014-06-29 (×2): qty 4

## 2014-06-29 MED ORDER — FUROSEMIDE 10 MG/ML IJ SOLN
60.0000 mg | Freq: Three times a day (TID) | INTRAMUSCULAR | Status: AC
  Administered 2014-06-29 – 2014-06-30 (×3): 60 mg via INTRAVENOUS
  Filled 2014-06-29 (×3): qty 6

## 2014-06-29 MED ORDER — POTASSIUM CHLORIDE 10 MEQ/100ML IV SOLN
10.0000 meq | INTRAVENOUS | Status: AC
  Administered 2014-06-29 (×4): 10 meq via INTRAVENOUS
  Filled 2014-06-29 (×4): qty 100

## 2014-06-29 MED ORDER — FENTANYL CITRATE 0.05 MG/ML IJ SOLN
25.0000 ug | INTRAMUSCULAR | Status: DC | PRN
  Administered 2014-06-29 – 2014-07-01 (×3): 50 ug via INTRAVENOUS
  Administered 2014-07-02: 25 ug via INTRAVENOUS
  Administered 2014-07-02 (×3): 50 ug via INTRAVENOUS
  Administered 2014-07-02 – 2014-07-03 (×2): 25 ug via INTRAVENOUS
  Administered 2014-07-03: 50 ug via INTRAVENOUS
  Filled 2014-06-29 (×11): qty 2

## 2014-06-29 MED ORDER — VITAL AF 1.2 CAL PO LIQD
1000.0000 mL | ORAL | Status: DC
  Administered 2014-06-29 – 2014-06-30 (×2): 1000 mL
  Filled 2014-06-29 (×6): qty 1000

## 2014-06-29 MED ORDER — POTASSIUM CHLORIDE 20 MEQ/15ML (10%) PO SOLN
40.0000 meq | Freq: Once | ORAL | Status: AC
  Administered 2014-06-29: 40 meq
  Filled 2014-06-29: qty 30

## 2014-06-29 MED ORDER — SODIUM CHLORIDE 0.9 % IV BOLUS (SEPSIS)
500.0000 mL | Freq: Once | INTRAVENOUS | Status: AC
  Administered 2014-06-29: 500 mL via INTRAVENOUS

## 2014-06-29 MED ORDER — DEXMEDETOMIDINE HCL IN NACL 200 MCG/50ML IV SOLN
0.0000 ug/kg/h | INTRAVENOUS | Status: AC
  Administered 2014-06-29 (×2): 0.8 ug/kg/h via INTRAVENOUS
  Administered 2014-06-29: 0.498 ug/kg/h via INTRAVENOUS
  Administered 2014-06-29: 0.5 ug/kg/h via INTRAVENOUS
  Administered 2014-06-30: 0.4 ug/kg/h via INTRAVENOUS
  Administered 2014-06-30 – 2014-07-01 (×2): 0.8 ug/kg/h via INTRAVENOUS
  Administered 2014-07-01: 0.3 ug/kg/h via INTRAVENOUS
  Administered 2014-07-01: 0.8 ug/kg/h via INTRAVENOUS
  Administered 2014-07-01: 0.9 ug/kg/h via INTRAVENOUS
  Administered 2014-07-02: 0.5 ug/kg/h via INTRAVENOUS
  Filled 2014-06-29 (×13): qty 50

## 2014-06-29 NOTE — Progress Notes (Signed)
PULMONARY / CRITICAL CARE MEDICINE   Name: TAKASHA VETERE MRN: [id number] DOB: 19-Dec-1960    ADMISSION DATE:  06/21/2014 CONSULTATION DATE:  06/29/2014  REFERRING MD :  EDP  CHIEF COMPLAINT:  SOB  INITIAL PRESENTATION:  54 y.o. F, smoker,  admitted 2/24 with a 3 day hx of cough and SOB.  On arrival was found to have significant respiratory distress and hypoxia. Patient failed bipap and was intubated with a #6.5 ETT in ER, difficult intubation.   STUDIES:  CTA chest 2/24 >>> no PE, underlying emphysema.  Bilateral PNA, right hilar and mediastinal reactive lymphadenopathy.  SIGNIFICANT EVENTS: 2/24  Admit, intubated 2/25  Levo @ 10 mcg, sedate on vent.  Family concerned she has an 'allergy to albuterol'.   2/26  Weaned to 4 mcg levo, awake on vent, no acute events overnight, aspiration? 2/27  weaning levophed 3/1    right thoracentesis -->transudate  3/2   Extubated. Reintubated for increased work of breathing. Levophed restarted overnight.   SUBJECTIVE:   Passed SBT this am  VITAL SIGNS: Temp:  [97.6 F (36.4 C)-98.8 F (37.1 C)] 97.9 F (36.6 C) (03/03 0852) Pulse Rate:  [56-141] 70 (03/03 0900) Resp:  [8-35] 14 (03/03 0900) BP: (66-193)/(37-94) 105/58 mmHg (03/03 0900) SpO2:  [92 %-100 %] 99 % (03/03 0900) FiO2 (%):  [30 %-50 %] 30 % (03/03 0900) Weight:  [50.621 kg (111 lb 9.6 oz)] 50.621 kg (111 lb 9.6 oz) (03/03 0603)   HEMODYNAMICS: CVP:  [6 mmHg-18 mmHg] 8 mmHg   VENTILATOR SETTINGS: Vent Mode:  [-] PRVC FiO2 (%):  [30 %-50 %] 30 % Set Rate:  [14 bmp] 14 bmp Vt Set:  [450 mL] 450 mL PEEP:  [5 cmH20-6 cmH20] 5 cmH20 Plateau Pressure:  [17 cmH20-23 cmH20] 19 cmH20   INTAKE / OUTPUT: Intake/Output      03/02 0701 - 03/03 0700 03/03 0701 - 03/04 0700   I.V. (mL/kg) 688.8 (13.6) 72 (1.4)   Other 21    NG/GT 420 80   IV Piggyback 1200    Total Intake(mL/kg) 2329.8 (46) 152 (3)   Urine (mL/kg/hr) 3190 (2.6) 40 (0.3)   Emesis/NG output     Total Output  3190 40   Net -860.2 +112          PHYSICAL EXAMINATION:  Gen: on vent, arousable to voice HEENT: NCAT ETT in place PULM: clear bilaterally  CV: RRR, no MGR  Ab: BS+, soft, nontender Ext: notable edema in bilateral hands  Neuro: awake, follows commands, equal strength bilaterally  LABS:  PULMONARY  Recent Labs Lab 06/24/14 1358 06/28/14 1242 06/29/14 0548  PHART 7.438 7.434 7.499*  PCO2ART 36.9 51.3* 41.7  PO2ART 84.6 68.4* 107.0*  HCO3 24.5* 33.8* 32.1*  TCO2 22.1 30.1 28.9  O2SAT 96.6 92.2 97.9   CBC  Recent Labs Lab 06/26/14 0440 06/27/14 0445 06/29/14 0530  HGB 10.8* 10.9* 10.9*  HCT 34.2* 34.5* 34.8*  WBC 11.9* 13.1* 14.6*  PLT 166 197 313   CARDIAC  Recent Labs Lab 06/22/14 1050 06/22/14 1752 06/23/14 0108  TROPONINI 0.06* 0.05* 0.09*   No results for input(s): PROBNP in the last 168 hours.  CHEMISTRY  Recent Labs Lab 06/24/14 0415 06/25/14 0515 06/26/14 0440 06/27/14 0445 06/29/14 0530  NA 141 141 145 144 140  K 3.7 3.9 4.0 3.5 3.1*  CL 112 109 105 103 99  CO2 24 29 37* 38* 33*  GLUCOSE 193* 127* 102* 122* 133*  BUN 12 14 18  19 22  CREATININE 0.37* 0.39* 0.40* 0.37* 0.38*  CALCIUM 7.8* 7.7* 8.0* 8.0* 8.1*  MG 2.2 2.2 2.2 2.1 1.9  PHOS 2.8 2.3 2.9 3.1 3.0   Estimated Creatinine Clearance: 64.2 mL/min (by C-G formula based on Cr of 0.38).  LIVER  Recent Labs Lab 06/27/14 0445  PROT 4.8*   INFECTIOUS  Recent Labs Lab 06/22/14 1050 06/23/14 0058 06/24/14 0415  LATICACIDVEN 1.6  --   --   PROCALCITON 0.29 0.25 0.10   ENDOCRINE CBG (last 3)   Recent Labs  06/29/14 0005 06/29/14 0442 06/29/14 0846  GLUCAP 83 112* 157*     IMAGING x48h Dg Chest Port 1 View  06/29/2014   CLINICAL DATA:  Evaluate endotracheal tube position.  EXAM: PORTABLE CHEST - 1 VIEW  COMPARISON:  06/28/2014.  FINDINGS: Support apparatus: Endotracheal tube tip 32 mm from the carina. Enteric tube present and esophagus with the tip not  visualized. LEFT IJ central line with the tip in the mid SVC. Monitoring leads project over the chest.  Cardiomediastinal Silhouette: Within normal limits for size, unchanged.  Lungs: Allowing for technical differences, no interval change and interstitial and alveolar pulmonary edema. This is greater on the RIGHT than LEFT. Kerley B-lines are present in the RIGHT upper lobe. No pneumothorax.  Effusions:  Small bilateral pleural effusions.  Other:  Surgical clips over the LEFT axilla  IMPRESSION: 1. Support apparatus unchanged and in good position. 2. Little if any interval change and diffuse interstitial and airspace opacity, most compatible with pulmonary edema.   Electronically Signed   By: Dereck Ligas M.D.   On: 06/29/2014 07:15   Portable Chest Xray  06/28/2014   CLINICAL DATA:  Acute respiratory failure, check endotracheal tube placement  EXAM: PORTABLE CHEST - 1 VIEW  COMPARISON:  06/27/2014  FINDINGS: Cardiac shadow is stable. A left jugular central line and endotracheal tube are noted in satisfactory position. Diffuse bilateral infiltrative changes are noted. Given some technical variations in the film the overall appearance is stable. No new focal abnormality is seen. A left pleural effusion is again noted.  IMPRESSION: Diffuse bilateral infiltrative changes stable from the prior study.   Electronically Signed   By: Inez Catalina M.D.   On: 06/28/2014 12:34   Dg Chest Port 1 View  06/27/2014   CLINICAL DATA:  Post right thoracentesis  EXAM: PORTABLE CHEST - 1 VIEW  COMPARISON:  06/27/2014  FINDINGS: Cardiomediastinal silhouette is stable. Stable endotracheal and NG tube position. Stable left IJ central line position. Persistent interstitial prominence bilaterally highly suspicious for interstitial edema or pneumonitis. Improvement in aeration in right base with significant decrease in right pleural effusion. No pneumothorax. Trace left basilar atelectasis.  IMPRESSION: Stable support apparatus. Again  noted interstitial prominence bilaterally suspicious for interstitial edema or pneumonitis. Improvement in aeration right base with significant decrease in right pleural effusion. No pneumothorax.   Electronically Signed   By: Lahoma Crocker M.D.   On: 06/27/2014 11:44   Dg Abd Portable 1v  06/28/2014   CLINICAL DATA:  Initial encounter for NG tube placement  EXAM: PORTABLE ABDOMEN - 1 VIEW  COMPARISON:  06/23/2014.  On  FINDINGS: OG tube tip overlies the mid to distal stomach. Bowel gas pattern is nonspecific.  IMPRESSION: Tip of the OG tube overlies the mid to distal stomach.   Electronically Signed   By: Misty Stanley M.D.   On: 06/28/2014 18:19     ASSESSMENT / PLAN:  PULMONARY OETT 3/2 (size 7,  easy airway with glidescope) A: Acute Hypoxic Respiratory failure - in setting of AECOPD + Flu A + CAP; some pulmonary edema  2/28 Tobacco Abuse - smoker, no baseline PFT's Bilateral R>L pleural effusions -->right transudate by light's criteria  Fluid analysis from [facility] was c/w transudate  P:   Continue vent support today, reassess for extubation 3/4  Taper steroids  Re-eval lasix needs daily  Trend CXR  Nicotine patch Set up f/u with [facility] on discharge for PFT's and management of presumed COPD.  CARDIOVASCULAR CVL L IJ 2/24 >>> A:  Septic Shock resolved but still some hypotension 2/28 and 2/29 likely due to sedation (nurses note more hypotension to fentanyl than to propofol)--> levophed restarted 3/2  P:  Wean levophed as tolerated. Goal MAP >65.  Change sedation to decrease pressor requirements - see Neuro section.   Allow neg NET goal as BP will allow  CVP q shift  RENAL A:   Hypokalemia  P:   KVO fluids Monitor BMET and UOP Repeat & Replace electrolytes as needed  GASTROINTESTINAL A:   GERD Nutrition Hx diverticulosis, internal hemorrhoids P:   Stress ulcer prophylaxis - Pantoprazole Bowel regimen - senna Currently tolerating tube feeds.   HEMATOLOGIC A:    VTE Prophylaxis P:  SCD's / Lovenox CBC in AM  INFECTIOUS BCx2 2/24 >>neg  UCx 2/24 >>neg  Sputum 2/24 >>neg  RVP 2/24 >> INFLUENZA A U. Strep 2/25 >> neg U. Legionella 2/25 >>neg Right pleural fluid 3/1>>> no organisms  A:   CAP  P:   Abx: Ceftriaxone, start date 2/24, day 7/7 Abx: Azithromycin, start date 2/24, day 7/7 Antiviral Tamiflu 2/25 >> 2/29 Completed abx, trend fever and WBC curve   ENDOCRINE A:   No known issues  P:   SSI if glucose consistently > 180.  NEUROLOGIC A:   Acute Metabolic Encephalopathy - in setting of hypercapnia  Hx Fibromyalgia, cluster headaches, back pain P:   PAD protocol - stop propofol. Start precedex, wean fentanyl as tolerated.  Goal RASS - 2 Supportive care   Family updated: Sister, father, brother updated at bedside at length 2/28 by Dr Lake Bells, father and brother updated by Dr Lamonte Sakai on 3/1  NP discussion 54 y.o. F, smoker, admitted 2/24 with a 3 day hx of cough and SOB.  Has completed course of antibiotics for CAP and tamiflu for +influenza. Reintubated 3/2 after short period of extubation and bipap. Sedation and diuresis post-intubation, now back on levophed. Will DC propofol today and use precedex for sedation, wean fentanyl as tolerated. Will reevaluate for extubation 3/4.    Attending Note:  I have examined patient, reviewed labs, studies and notes. I have discussed the case with Jerrye Bushy, and I agree with the data and plans as amended above. Pt sedated, comfortable with good lung mechanics on MV 3/3. Continues to have B infiltrates, positive volume balance. Will plan to ventilate another day, assess for SBT and extubation 3/4. Will push diuresis as BP and renal fxn will tolerate.  Independent critical care time is 40 minutes.   Baltazar Apo, MD, PhD 06/30/2014, 7:30 AM [facility] [phone] or if no answer [phone]

## 2014-06-29 NOTE — Progress Notes (Deleted)
NUTRITION FOLLOW UP  Intervention:   - Diet advancement per MD - Once diet advanced, will add Ensure Complete po BID, each supplement provides 350 kcal and 13 grams of protein - RD will continue to monitor  Nutrition Dx:   Inadequate oral intake related to inability to eat as evidenced by NPO status; ongoing.  Goal:   Pt to meet >/= 90% of estimated needs; not met  Monitor:   Diet advancement, weight trends, labs  Assessment:   Pt admitted with SOB and cough x 3 days. Pt is a smoker, diagnosed with COPD. PMH significant for GERD, fibromyalgia, IBS, headaches, internal hemorroids and hiatal hernia.   - Pt extubated this morning. Per RN, pt may need to be re-intubated. - TF stopped for extubation. Pt currently NPO. Reports that her usual body weight is 103 lbs. She was eating well prior to admission.  - RD will follow for diet advancement.  - Labs reviewed   Height: Ht Readings from Last 1 Encounters:  06/21/14 5' 4"  (1.626 m)    Weight Status:   Wt Readings from Last 1 Encounters:  06/29/14 111 lb 9.6 oz (50.621 kg)   Re-estimated needs:  Kcal: 1400-1600 kcals Protein: 90-100 g protein Fluid: >/= 1.5 L/day  Skin: intact  Diet Order: Diet NPO time specified   Intake/Output Summary (Last 24 hours) at 06/29/14 1138 Last data filed at 06/29/14 1000  Gross per 24 hour  Intake 2396.84 ml  Output   2900 ml  Net -503.16 ml    Last BM: 2/25   Labs:   Recent Labs Lab 06/26/14 0440 06/27/14 0445 06/29/14 0530  NA 145 144 140  K 4.0 3.5 3.1*  CL 105 103 99  CO2 37* 38* 33*  BUN 18 19 22   CREATININE 0.40* 0.37* 0.38*  CALCIUM 8.0* 8.0* 8.1*  MG 2.2 2.1 1.9  PHOS 2.9 3.1 3.0  GLUCOSE 102* 122* 133*    CBG (last 3)   Recent Labs  06/29/14 0005 06/29/14 0442 06/29/14 0846  GLUCAP 83 112* 157*    Scheduled Meds: . antiseptic oral rinse  7 mL Mouth Rinse QID  . chlorhexidine  15 mL Mouth Rinse BID  . enoxaparin (LOVENOX) injection  40 mg  Subcutaneous Q24H  . feeding supplement (VITAL HIGH PROTEIN)  1,000 mL Per Tube Q24H  . furosemide  60 mg Intravenous Q8H  . insulin aspart  0-15 Units Subcutaneous 6 times per day  . ipratropium-albuterol  3 mL Nebulization Q4H  . methylPREDNISolone (SOLU-MEDROL) injection  40 mg Intravenous Daily  . nicotine  21 mg Transdermal Daily  . pantoprazole (PROTONIX) IV  40 mg Intravenous Daily  . potassium chloride  10 mEq Intravenous Q1 Hr x 4  . potassium chloride  40 mEq Per Tube Once    Continuous Infusions: . sodium chloride 500 mL (06/28/14 1324)  . dexmedetomidine 0.5 mcg/kg/hr (06/29/14 1043)  . norepinephrine (LEVOPHED) Adult infusion 2 mcg/min (06/29/14 0600)    Laurette Schimke [place], [place], [place]

## 2014-06-29 NOTE — Care Management Note (Signed)
CARE MANAGEMENT NOTE 06/29/2014  Patient:  [facility]   Account Number:  [id number]  Date Initiated:  06/26/2014  Documentation initiated by:  DAVIS,RHONDA  Subjective/Objective Assessment:   pt intubated in ed after familing bipap     Action/Plan:   tbd   Anticipated DC Date:  07/02/2014   Anticipated DC Plan:    In-house referral  Clinical Social Worker      DC Planning Services  CM consult      [facility] Choice  NA   Choice offered to / List presented to:  NA   DME arranged  NA      DME agency  NA     [place] arranged  NA      [place] agency  NA   Status of service:  In process, will continue to follow Medicare Important Message given?   (If response is "NO", the following Medicare IM given date fields will be blank) Date Medicare IM given:   Medicare IM given by:   Date Additional Medicare IM given:   Additional Medicare IM given by:    Discharge Disposition:    Per UR Regulation:  Reviewed for med. necessity/level of care/duration of stay  If discussed at [place] of Stay Meetings, dates discussed:    Comments:  June 29, 2014/Rhonda L. Rosana Hoes, RN, BSN, CCM. Case Management [place] [phone] No discharge needs present of time of review.  pt extubated and reintubated on [id number]/iv levophed restarted. iv Precedex ongoing.  Feb. 29 2016/Rhonda L. Rosana Hoes, RN, BSN, CCM. Case Management [place] [phone] No discharge needs present of time of review.

## 2014-06-29 NOTE — Progress Notes (Addendum)
NUTRITION FOLLOW UP  Intervention:   - Diet advancement per MD  - Initiate Vital AF @ 40 ml/hr via OGT and increase by 10 ml every 4 hours to goal rate of 45 ml/hr.   Tube feeding regimen provides 1296 kcal (105% of needs), 81 grams of protein, and 875 ml of H2O.   - RD will continue to monitor  Nutrition Dx:   Inadequate oral intake related to inability to eat as evidenced by NPO status; ongoing.  Goal:   Pt to meet >/= 90% of estimated needs; not met  Monitor:   Diet advancement, TF initiation and tolerance, weight trends, labs  Assessment:   Pt admitted with SOB and cough x 3 days. Pt is a smoker, diagnosed with COPD. PMH significant for GERD, fibromyalgia, IBS, headaches, internal hemorroids and hiatal hernia.   - Pt extubated then re-intubated 3/2.   Patient is currently intubated on ventilator support MV: 6.3 L/min Temp (24hrs), Avg:98.4 F (36.9 C), Min:97.9 F (36.6 C), Max:98.8 F (37.1 C)  - Consult received for TF initiation and management. RD to change TF formula to better meet pt's needs.  - Pt on bowel regimen- Senna  - Per RN, pt is currently tolerating TF- Vital HP@40  mL/hr to provide 960 kcal (78% of estimated needs) and 84 g protein (100% of estimated needs).   - Reports that her usual body weight is 103 lbs. She was eating well prior to admission.  - RD will follow for diet advancement.  - Labs reviewed   Height: Ht Readings from Last 1 Encounters:  06/21/14 5' 4"  (1.626 m)    Weight Status:   Wt Readings from Last 1 Encounters:  06/29/14 111 lb 9.6 oz (50.621 kg)   Re-estimated needs:  Kcal: 1400-1600 kcals Protein: 90-100 g protein Fluid: >/= 1.5 L/day  Skin: intact  Diet Order: Diet NPO time specified   Intake/Output Summary (Last 24 hours) at 06/29/14 1511 Last data filed at 06/29/14 1411  Gross per 24 hour  Intake 2618.7 ml  Output   2290 ml  Net  328.7 ml    Last BM: 2/25   Labs:   Recent Labs Lab 06/26/14 0440  06/27/14 0445 06/29/14 0530  NA 145 144 140  K 4.0 3.5 3.1*  CL 105 103 99  CO2 37* 38* 33*  BUN 18 19 22   CREATININE 0.40* 0.37* 0.38*  CALCIUM 8.0* 8.0* 8.1*  MG 2.2 2.1 1.9  PHOS 2.9 3.1 3.0  GLUCOSE 102* 122* 133*    CBG (last 3)   Recent Labs  06/29/14 0442 06/29/14 0846 06/29/14 1204  GLUCAP 112* 157* 149*    Scheduled Meds: . antiseptic oral rinse  7 mL Mouth Rinse QID  . chlorhexidine  15 mL Mouth Rinse BID  . enoxaparin (LOVENOX) injection  40 mg Subcutaneous Q24H  . feeding supplement (VITAL HIGH PROTEIN)  1,000 mL Per Tube Q24H  . furosemide  60 mg Intravenous Q8H  . insulin aspart  0-15 Units Subcutaneous 6 times per day  . ipratropium-albuterol  3 mL Nebulization Q4H  . methylPREDNISolone (SOLU-MEDROL) injection  40 mg Intravenous Daily  . nicotine  21 mg Transdermal Daily  . pantoprazole (PROTONIX) IV  40 mg Intravenous Daily    Continuous Infusions: . sodium chloride 500 mL (06/28/14 1324)  . dexmedetomidine 0.5 mcg/kg/hr (06/29/14 1400)  . norepinephrine (LEVOPHED) Adult infusion 1 mcg/min (06/29/14 1311)    Laurette Schimke [place], [place], 

## 2014-06-29 NOTE — Progress Notes (Signed)
165cc fentanyl drip wasted in sink. Witnessed by Drue Dun RN

## 2014-06-30 ENCOUNTER — Inpatient Hospital Stay (HOSPITAL_COMMUNITY): Payer: Medicare Other

## 2014-06-30 LAB — BLOOD GAS, ARTERIAL
Acid-Base Excess: 10.8 mmol/L — ABNORMAL HIGH (ref 0.0–2.0)
BICARBONATE: 34.6 meq/L — AB (ref 20.0–24.0)
DRAWN BY: 31814
FIO2: 0.3 %
LHR: 14 {breaths}/min
MECHVT: 450 mL
O2 SAT: 91.9 %
PATIENT TEMPERATURE: 98
PCO2 ART: 41.3 mmHg (ref 35.0–45.0)
PEEP: 5 cmH2O
PO2 ART: 62.4 mmHg — AB (ref 80.0–100.0)
TCO2: 30.1 mmol/L (ref 0–100)
pH, Arterial: 7.532 — ABNORMAL HIGH (ref 7.350–7.450)

## 2014-06-30 LAB — GLUCOSE, CAPILLARY
GLUCOSE-CAPILLARY: 135 mg/dL — AB (ref 70–99)
GLUCOSE-CAPILLARY: 162 mg/dL — AB (ref 70–99)
GLUCOSE-CAPILLARY: 171 mg/dL — AB (ref 70–99)
Glucose-Capillary: 108 mg/dL — ABNORMAL HIGH (ref 70–99)
Glucose-Capillary: 152 mg/dL — ABNORMAL HIGH (ref 70–99)
Glucose-Capillary: 172 mg/dL — ABNORMAL HIGH (ref 70–99)

## 2014-06-30 LAB — BASIC METABOLIC PANEL
Anion gap: 7 (ref 5–15)
Anion gap: 4 — ABNORMAL LOW (ref 5–15)
BUN: 20 mg/dL (ref 6–23)
BUN: 20 mg/dL (ref 6–23)
CALCIUM: 8.4 mg/dL (ref 8.4–10.5)
CHLORIDE: 105 mmol/L (ref 96–112)
CO2: 36 mmol/L — ABNORMAL HIGH (ref 19–32)
CO2: 32 mmol/L (ref 19–32)
CREATININE: 0.4 mg/dL — AB (ref 0.50–1.10)
Calcium: 8.1 mg/dL — ABNORMAL LOW (ref 8.4–10.5)
Chloride: 98 mmol/L (ref 96–112)
Creatinine, Ser: 0.44 mg/dL — ABNORMAL LOW (ref 0.50–1.10)
GFR calc Af Amer: >90 mL/min (ref >90)
GFR calc non Af Amer: >90 mL/min (ref >90)
Glucose, Bld: 165 mg/dL — ABNORMAL HIGH (ref 70–99)
Glucose, Bld: 168 mg/dL — ABNORMAL HIGH (ref 70–99)
POTASSIUM: 2.9 mmol/L — AB (ref 3.5–5.1)
Potassium: 4.1 mmol/L (ref 3.5–5.1)
Sodium: 141 mmol/L (ref 135–145)
Sodium: 141 mmol/L (ref 135–145)

## 2014-06-30 LAB — CBC
HEMATOCRIT: 38.2 % (ref 36.0–46.0)
Hemoglobin: 12.2 g/dL (ref 12.0–15.0)
MCH: 30.1 pg (ref 26.0–34.0)
MCHC: 31.9 g/dL (ref 30.0–36.0)
MCV: 94.3 fL (ref 78.0–100.0)
Platelets: 332 10*3/uL (ref 150–400)
RBC: 4.05 MIL/uL (ref 3.87–5.11)
RDW: 14.6 % (ref 11.5–15.5)
WBC: 12.8 10*3/uL — ABNORMAL HIGH (ref 4.0–10.5)

## 2014-06-30 MED ORDER — PANTOPRAZOLE SODIUM 40 MG PO PACK
40.0000 mg | PACK | Freq: Every day | ORAL | Status: DC
  Filled 2014-06-30 (×3): qty 20

## 2014-06-30 MED ORDER — POTASSIUM CHLORIDE 20 MEQ/15ML (10%) PO SOLN
40.0000 meq | ORAL | Status: AC
  Administered 2014-06-30 (×2): 40 meq
  Filled 2014-06-30 (×2): qty 30

## 2014-06-30 MED ORDER — FUROSEMIDE 10 MG/ML IJ SOLN
60.0000 mg | Freq: Two times a day (BID) | INTRAMUSCULAR | Status: AC
  Administered 2014-06-30 – 2014-07-01 (×2): 60 mg via INTRAVENOUS
  Filled 2014-06-30 (×3): qty 6

## 2014-06-30 MED ORDER — POTASSIUM CHLORIDE 20 MEQ/15ML (10%) PO SOLN
40.0000 meq | Freq: Two times a day (BID) | ORAL | Status: AC
  Administered 2014-06-30 (×2): 40 meq
  Filled 2014-06-30 (×2): qty 30

## 2014-06-30 MED ORDER — PREDNISONE 5 MG/5ML PO SOLN
30.0000 mg | Freq: Every day | ORAL | Status: DC
  Administered 2014-07-01: 30 mg
  Filled 2014-06-30 (×3): qty 30

## 2014-06-30 MED ORDER — MAGNESIUM SULFATE 4 GM/100ML IV SOLN
4.0000 g | Freq: Once | INTRAVENOUS | Status: AC
  Administered 2014-06-30: 4 g via INTRAVENOUS
  Filled 2014-06-30: qty 100

## 2014-06-30 NOTE — Progress Notes (Signed)
NUTRITION FOLLOW UP  Intervention:   - Diet advancement per MD  - Continue Vital AF 1.2 @ 45 ml/hr.   Tube feeding regimen provides 1296 kcal (105% of needs), 81 grams of protein, and 875 ml of H2O.   - RD will continue to monitor  Nutrition Dx:   Inadequate oral intake related to inability to eat as evidenced by NPO status; ongoing.  Goal:   Pt to meet >/= 90% of estimated needs; met with TF  Monitor:   Diet advancement, TF  tolerance, weight trends, labs  Assessment:   Pt admitted with SOB and cough x 3 days. Pt is a smoker, diagnosed with COPD. PMH significant for GERD, fibromyalgia, IBS, headaches, internal hemorroids and hiatal hernia.   - Pt extubated then re-intubated 3/2.   Patient is currently intubated on ventilator support MV: 8.0 L/min Temp (24hrs), Avg:98.2 F (36.8 C), Min:98 F (36.7 C), Max:98.4 F (36.9 C)  Propofol: none  - Per RN, pt tolerating change in TF formula at goal rate. Minimal residuals.  - Per MD note, will reassess for extubation 3/5 - Pt on bowel regimen- Senna  - Reports that her usual body weight is 103 lbs. She was eating well prior to admission.  - RD will follow for diet advancement.  - Labs reviewed   Height: Ht Readings from Last 1 Encounters:  06/21/14 _0  (1.626 m)    Weight Status:   Wt Readings from Last 1 Encounters:  06/30/14 109 lb 11.2 oz (49.76 kg)   Re-estimated needs:  Kcal: 1284 kcals Protein: 80-90 g protein Fluid: Per MD  Skin: intact  Diet Order: Diet NPO time specified   Intake/Output Summary (Last 24 hours) at 06/30/14 1040 Last data filed at 06/30/14 1000  Gross per 24 hour  Intake 2002.9 ml  Output   4230 ml  Net -2227.1 ml    Last BM: 2/25   Labs:   Recent Labs Lab 06/26/14 0440 06/27/14 0445 06/29/14 0530 06/30/14 0445  NA 145 144 140 141  K 4.0 3.5 3.1* 2.9*  CL 105 103 99 98  CO2 37* 38* 33* 36*  BUN _1 CREATININE 0.40* 0.37* 0.38* 0.44*  CALCIUM 8.0*  8.0* 8.1* 8.4  MG 2.2 2.1 1.9  --   PHOS 2.9 3.1 3.0  --   GLUCOSE 102* 122* 133* 165*    CBG (last 3)   Recent Labs  06/30/14 0021 06/30/14 0407 06/30/14 0830  GLUCAP 108* 135* 162*    Scheduled Meds: . antiseptic oral rinse  7 mL Mouth Rinse QID  . chlorhexidine  15 mL Mouth Rinse BID  . enoxaparin (LOVENOX) injection  40 mg Subcutaneous Q24H  . furosemide  60 mg Intravenous Q12H  . insulin aspart  0-15 Units Subcutaneous 6 times per day  . ipratropium-albuterol  3 mL Nebulization Q4H  . magnesium sulfate 1 - 4 g bolus IVPB  4 g Intravenous Once  . nicotine  21 mg Transdermal Daily  . pantoprazole sodium  40 mg Per Tube Q1200  . potassium chloride  40 mEq Per Tube Q12H  . predniSONE  30 mg Per Tube Daily    Continuous Infusions: . sodium chloride 500 mL (06/28/14 1324)  . dexmedetomidine 0.4 mcg/kg/hr (06/30/14 1032)  . feeding supplement (VITAL AF 1.2 CAL) 1,000 mL (06/29/14 1636)  . norepinephrine (LEVOPHED) Adult infusion 1 mcg/min (06/30/14 0600)    Laurette Schimke [place], [place], [place]

## 2014-06-30 NOTE — Progress Notes (Signed)
[facility] ADULT ICU REPLACEMENT PROTOCOL FOR AM LAB REPLACEMENT ONLY  The patient does apply for the [facility] Adult ICU Electrolyte Replacment Protocol based on the criteria listed below:   1. Is GFR >/= 40 ml/min? Yes.    Patient's GFR today is >90 2. Is urine output >/= 0.5 ml/kg/hr for the last 6 hours? Yes.   Patient's UOP is 1.8 ml/kg/hr 3. Is BUN < 60 mg/dL? Yes.    Patient's BUN today is 20 4. Abnormal electrolyte(s): K 2.9 5. Ordered repletion with: [facility] adult ICU replacement protocol 6. If a panic level lab has been reported, has the CCM MD in charge been notified? Yes.  .   Physician:  Dr. Brand Males  [facility], Darrick Huntsman E 06/30/2014 6:39 AM

## 2014-06-30 NOTE — Progress Notes (Signed)
PULMONARY / CRITICAL CARE MEDICINE   Name: JILLIAM BELLMORE MRN: [id number] DOB: 06/18/1960    ADMISSION DATE:  06/21/2014 CONSULTATION DATE:  06/30/2014  REFERRING MD :  EDP  CHIEF COMPLAINT:  SOB  INITIAL PRESENTATION:  54 y.o. F, smoker,  admitted 2/24 with a 3 day hx of cough and SOB.  On arrival was found to have significant respiratory distress and hypoxia. Patient failed bipap and was intubated with a #6.5 ETT in ER, difficult intubation.   STUDIES:  CTA chest 2/24 >>> no PE, underlying emphysema.  Bilateral PNA, right hilar and mediastinal reactive lymphadenopathy.  SIGNIFICANT EVENTS: 2/24  Admit, intubated 2/25  Levo @ 10 mcg, sedate on vent.  Family concerned she has an 'allergy to albuterol'.   2/26  Weaned to 4 mcg levo, awake on vent, no acute events overnight, aspiration? 2/27  weaning levophed 3/1    right thoracentesis -->transudate  3/2   Extubated. Reintubated for increased work of breathing. Levophed restarted overnight.   SUBJECTIVE:   Passed SBT this am  VITAL SIGNS: Temp:  [98 F (36.7 C)-98.4 F (36.9 C)] 98.4 F (36.9 C) (03/04 0820) Pulse Rate:  [55-77] 55 (03/04 0347) Resp:  [12-24] 24 (03/04 1000) BP: (82-140)/(46-74) 91/52 mmHg (03/04 1000) SpO2:  [94 %-100 %] 97 % (03/04 1000) FiO2 (%):  [30 %-40 %] 30 % (03/04 1000) Weight:  [49.76 kg (109 lb 11.2 oz)] 49.76 kg (109 lb 11.2 oz) (03/04 0454)   HEMODYNAMICS: CVP:  [0 mmHg-14 mmHg] 7 mmHg   VENTILATOR SETTINGS: Vent Mode:  [-] Other (Comment) FiO2 (%):  [30 %-40 %] 30 % Set Rate:  [14 bmp] 14 bmp Vt Set:  [450 mL] 450 mL PEEP:  [5 cmH20] 5 cmH20 Pressure Support:  [10 NPY05-11 cmH20] 10 cmH20 Plateau Pressure:  [17 cmH20-20 cmH20] 20 cmH20   INTAKE / OUTPUT: Intake/Output      03/03 0701 - 03/04 0700 03/04 0701 - 03/05 0700   I.V. (mL/kg) 709.2 (14.3) 91.7 (1.8)   Other     NG/GT 995 135   IV Piggyback 400    Total Intake(mL/kg) 2104.2 (42.3) 226.7 (4.6)   Urine (mL/kg/hr) 4150  (3.5) 140 (0.9)   Emesis/NG output 100 (0.1)    Total Output 4250 140   Net -2145.8 +86.7          PHYSICAL EXAMINATION:  Gen: on vent, arousable to voice, follows commands HEENT: NCAT ETT in place PULM: clear bilaterally, no wheeze  CV: RRR, no MGR  Ab: BS+, soft, nontender Ext: notable edema in bilateral hands  Neuro: awake, follows commands, equal strength bilaterally  LABS:  PULMONARY  Recent Labs Lab 06/24/14 1358 06/28/14 1242 06/29/14 0548 06/30/14 0402  PHART 7.438 7.434 7.499* 7.532*  PCO2ART 36.9 51.3* 41.7 41.3  PO2ART 84.6 68.4* 107.0* 62.4*  HCO3 24.5* 33.8* 32.1* 34.6*  TCO2 22.1 30.1 28.9 30.1  O2SAT 96.6 92.2 97.9 91.9   CBC  Recent Labs Lab 06/27/14 0445 06/29/14 0530 06/30/14 0445  HGB 10.9* 10.9* 12.2  HCT 34.5* 34.8* 38.2  WBC 13.1* 14.6* 12.8*  PLT 197 313 332   CARDIAC No results for input(s): TROPONINI in the last 168 hours. No results for input(s): PROBNP in the last 168 hours.  CHEMISTRY  Recent Labs Lab 06/24/14 0415 06/25/14 0515 06/26/14 0440 06/27/14 0445 06/29/14 0530 06/30/14 0445  NA 141 141 145 144 140 141  K 3.7 3.9 4.0 3.5 3.1* 2.9*  CL 112 109 105 103 99 98  CO2 24 29 37* 38* 33* 36*  GLUCOSE 193* 127* 102* 122* 133* 165*  BUN 12 14 18 19 22 20   CREATININE 0.37* 0.39* 0.40* 0.37* 0.38* 0.44*  CALCIUM 7.8* 7.7* 8.0* 8.0* 8.1* 8.4  MG 2.2 2.2 2.2 2.1 1.9  --   PHOS 2.8 2.3 2.9 3.1 3.0  --    Estimated Creatinine Clearance: 63.2 mL/min (by C-G formula based on Cr of 0.44).  LIVER  Recent Labs Lab 06/27/14 0445  PROT 4.8*   INFECTIOUS  Recent Labs Lab 06/24/14 0415  PROCALCITON 0.10   ENDOCRINE CBG (last 3)   Recent Labs  06/30/14 0021 06/30/14 0407 06/30/14 0830  GLUCAP 108* 135* 162*     IMAGING x48h Dg Chest Port 1 View  06/30/2014   CLINICAL DATA:  Respiratory failure, intubated patient.  EXAM: PORTABLE CHEST - 1 VIEW  COMPARISON:  Portable chest x-ray of June 29, 2014  FINDINGS:  The lungs are well-expanded. The interstitial markings are diffusely increased but have improved slightly since yesterday's exam. Confluent densities in the left mid lung and right infrahilar early region are less conspicuous as well. The cardiac silhouette is normal in size. The pulmonary vascularity is more distinct and less engorged today. The endotracheal tube tip projects 5.1 cm above the crotch of the carina. The esophagogastric tube tip projects below the inferior margin of the image. The left internal jugular venous catheter tip projects over the midportion of the SVC.  IMPRESSION: Improved appearance of the pulmonary interstitium suggesting improving interstitial edema/pneumonia. Alveolar opacities bilaterally are less conspicuous as well.   Electronically Signed   By: David  Martinique   On: 06/30/2014 07:18   Dg Chest Port 1 View  06/29/2014   CLINICAL DATA:  Evaluate endotracheal tube position.  EXAM: PORTABLE CHEST - 1 VIEW  COMPARISON:  06/28/2014.  FINDINGS: Support apparatus: Endotracheal tube tip 32 mm from the carina. Enteric tube present and esophagus with the tip not visualized. LEFT IJ central line with the tip in the mid SVC. Monitoring leads project over the chest.  Cardiomediastinal Silhouette: Within normal limits for size, unchanged.  Lungs: Allowing for technical differences, no interval change and interstitial and alveolar pulmonary edema. This is greater on the RIGHT than LEFT. Kerley B-lines are present in the RIGHT upper lobe. No pneumothorax.  Effusions:  Small bilateral pleural effusions.  Other:  Surgical clips over the LEFT axilla  IMPRESSION: 1. Support apparatus unchanged and in good position. 2. Little if any interval change and diffuse interstitial and airspace opacity, most compatible with pulmonary edema.   Electronically Signed   By: Dereck Ligas M.D.   On: 06/29/2014 07:15   Portable Chest Xray  06/28/2014   CLINICAL DATA:  Acute respiratory failure, check endotracheal  tube placement  EXAM: PORTABLE CHEST - 1 VIEW  COMPARISON:  06/27/2014  FINDINGS: Cardiac shadow is stable. A left jugular central line and endotracheal tube are noted in satisfactory position. Diffuse bilateral infiltrative changes are noted. Given some technical variations in the film the overall appearance is stable. No new focal abnormality is seen. A left pleural effusion is again noted.  IMPRESSION: Diffuse bilateral infiltrative changes stable from the prior study.   Electronically Signed   By: Inez Catalina M.D.   On: 06/28/2014 12:34   Dg Abd Portable 1v  06/28/2014   CLINICAL DATA:  Initial encounter for NG tube placement  EXAM: PORTABLE ABDOMEN - 1 VIEW  COMPARISON:  06/23/2014.  On  FINDINGS: OG tube  tip overlies the mid to distal stomach. Bowel gas pattern is nonspecific.  IMPRESSION: Tip of the OG tube overlies the mid to distal stomach.   Electronically Signed   By: Misty Stanley M.D.   On: 06/28/2014 18:19     ASSESSMENT / PLAN:  PULMONARY OETT 3/2 (size 7, easy airway with glidescope) A: Acute Hypoxic Respiratory failure - in setting of AECOPD + Flu A + CAP; some pulmonary edema  2/28 Tobacco Abuse - smoker, no baseline PFT's Bilateral R>L pleural effusions -->right transudate by light's criteria  CXR improved. Weaning but mechanics suggest not ready yet.  P:   Continue vent support today, reassess for extubation 3/5 Taper steroids  Cont duoneb Re-eval lasix needs daily  Trend CXR  Nicotine patch Set up f/u with [facility] on discharge for PFT's and management of presumed COPD.  CARDIOVASCULAR CVL L IJ 2/24 >>> A:  Septic Shock resolved but still some hypotension 2/28 and 2/29 likely due to sedation (nurses note more hypotension to fentanyl than to propofol)--> requires low dose levophed when sedated P:  Wean levophed as tolerated. Goal SBP >90.   Allow neg NET goal as BP will allow  CVP q shift Repeat diuresis 3/4  RENAL A:   Hypokalemia  P:   KVO  fluids Monitor BMET and UOP Repeat & Replace electrolytes as needed  GASTROINTESTINAL A:   GERD Nutrition Hx diverticulosis, internal hemorrhoids P:   Stress ulcer prophylaxis - Pantoprazole Bowel regimen - senna Currently tolerating tube feeds.   HEMATOLOGIC A:   VTE Prophylaxis P:  SCD's / Lovenox CBC in AM  INFECTIOUS BCx2 2/24 >>neg  UCx 2/24 >>neg  Sputum 2/24 >>neg  RVP 2/24 >> INFLUENZA A U. Strep 2/25 >> neg U. Legionella 2/25 >>neg Right pleural fluid 3/1>>> no organisms  A:   CAP  P:   Abx: Ceftriaxone, start date 2/24, completed 7d Abx: Azithromycin, start date 2/24, completed 7d Antiviral Tamiflu 2/25 >> 2/29 Completed abx, trend fever and WBC curve   ENDOCRINE A:   No known issues  P:   SSI if glucose consistently > 180.  NEUROLOGIC A:   Acute Metabolic Encephalopathy - in setting of hypercapnia  Hx Fibromyalgia, cluster headaches, back pain P:   PAD protocol  precedex gtt w/ PRN fentanyl Goal RASS - 2 Supportive care   Family updated: Sister, father, brother updated daily by Jerrye Bushy and Dr Lamonte Sakai  NP discussion 54 y.o. F, smoker, admitted 2/24 with a 3 day hx of cough and SOB.  Has completed course of antibiotics for CAP and tamiflu for +influenza. Reintubated 3/2 after short period of extubation and bipap. Sedation and diuresis post-intubation, now remains on low dose levophed (to allow for lasix). Hope we can extubate soon  Attending Note:  I have examined patient, reviewed labs, studies and notes. I have discussed the case with Jerrye Bushy, and I agree with the data and plans as amended above. She appears comfortable on exam, sedated. Inspiratory crackles and CXR have improved with diuretics. I would like continue another day of diuretics and overall recovery and then assess for extubation 3/5. She will do higher PSV 3/4 as tolerated. Will correct electrolytes. Independent critical care time is 35 minutes.   Baltazar Apo, MD,  PhD 06/30/2014, 10:34 AM [facility] [phone] or if no answer [phone]

## 2014-07-01 LAB — COMPREHENSIVE METABOLIC PANEL
ALK PHOS: 85 U/L (ref 39–117)
ALT: 67 U/L — ABNORMAL HIGH (ref 0–35)
ANION GAP: 7 (ref 5–15)
AST: 22 U/L (ref 0–37)
Albumin: 2.8 g/dL — ABNORMAL LOW (ref 3.5–5.2)
BILIRUBIN TOTAL: 0.7 mg/dL (ref 0.3–1.2)
BUN: 22 mg/dL (ref 6–23)
CO2: 34 mmol/L — ABNORMAL HIGH (ref 19–32)
Calcium: 8.4 mg/dL (ref 8.4–10.5)
Chloride: 101 mmol/L (ref 96–112)
Creatinine, Ser: 0.35 mg/dL — ABNORMAL LOW (ref 0.50–1.10)
GFR calc Af Amer: >90 mL/min (ref >90)
Glucose, Bld: 125 mg/dL — ABNORMAL HIGH (ref 70–99)
Potassium: 3.8 mmol/L (ref 3.5–5.1)
Sodium: 142 mmol/L (ref 135–145)
TOTAL PROTEIN: 6.3 g/dL (ref 6.0–8.3)

## 2014-07-01 LAB — PHOSPHORUS: Phosphorus: 3.7 mg/dL (ref 2.3–4.6)

## 2014-07-01 LAB — MAGNESIUM: MAGNESIUM: 2.3 mg/dL (ref 1.5–2.5)

## 2014-07-01 LAB — GLUCOSE, CAPILLARY
GLUCOSE-CAPILLARY: 101 mg/dL — AB (ref 70–99)
GLUCOSE-CAPILLARY: 126 mg/dL — AB (ref 70–99)
GLUCOSE-CAPILLARY: 131 mg/dL — AB (ref 70–99)
GLUCOSE-CAPILLARY: 142 mg/dL — AB (ref 70–99)
Glucose-Capillary: 134 mg/dL — ABNORMAL HIGH (ref 70–99)
Glucose-Capillary: 124 mg/dL — ABNORMAL HIGH (ref 70–99)

## 2014-07-01 LAB — CBC
HCT: 36.6 % (ref 36.0–46.0)
HEMOGLOBIN: 11.8 g/dL — AB (ref 12.0–15.0)
MCH: 30.1 pg (ref 26.0–34.0)
MCHC: 32.2 g/dL (ref 30.0–36.0)
MCV: 93.4 fL (ref 78.0–100.0)
PLATELETS: 366 10*3/uL (ref 150–400)
RBC: 3.92 MIL/uL (ref 3.87–5.11)
RDW: 14.8 % (ref 11.5–15.5)
WBC: 13.3 10*3/uL — AB (ref 4.0–10.5)

## 2014-07-01 LAB — BODY FLUID CULTURE
Culture: NO GROWTH
Gram Stain: NONE SEEN

## 2014-07-01 MED ORDER — METHYLPREDNISOLONE SODIUM SUCC 40 MG IJ SOLR
40.0000 mg | Freq: Every day | INTRAMUSCULAR | Status: DC
  Administered 2014-07-02 – 2014-07-04 (×3): 40 mg via INTRAVENOUS
  Filled 2014-07-01 (×3): qty 1

## 2014-07-01 MED ORDER — PANTOPRAZOLE SODIUM 40 MG IV SOLR
40.0000 mg | Freq: Every day | INTRAVENOUS | Status: DC
  Administered 2014-07-01 – 2014-07-04 (×4): 40 mg via INTRAVENOUS
  Filled 2014-07-01 (×4): qty 40

## 2014-07-01 MED ORDER — LORAZEPAM 2 MG/ML IJ SOLN
1.0000 mg | Freq: Four times a day (QID) | INTRAMUSCULAR | Status: DC | PRN
  Administered 2014-07-01 – 2014-07-03 (×6): 1 mg via INTRAVENOUS
  Filled 2014-07-01 (×6): qty 1

## 2014-07-01 NOTE — Progress Notes (Signed)
PT Cancellation Note  Patient Details Name: Elaine Le MRN: [id number] DOB: Aug 10, 1960   Cancelled Treatment:     PT deferred this date following discussion with RN.  Pt currently on bi-pap.  Will follow.   Lillyona Polasek 07/01/2014, 3:12 PM

## 2014-07-01 NOTE — Progress Notes (Signed)
Pt extubated to 4 nasal cannula. Vital signs stable. Rn instructed Chiropodist

## 2014-07-01 NOTE — Progress Notes (Signed)
SLP Cancellation Note  Patient Details Name: Elaine Le MRN: [id number] DOB: Dec 16, 1960   Cancelled treatment:       Reason Eval/Treat Not Completed: Patient not medically ready. Pt extubated earlier today and currently remains on BiPAP. Per discussion with RN, will hold for today.   Germain Osgood, M.A. CCC-SLP [phone]  Germain Osgood 07/01/2014, 3:31 PM

## 2014-07-01 NOTE — Progress Notes (Signed)
PULMONARY / CRITICAL CARE MEDICINE   Name: Elaine Le MRN: [id number] DOB: 18-Feb-1961    ADMISSION DATE:  06/21/2014 CONSULTATION DATE:  07/01/2014  REFERRING MD :  EDP  CHIEF COMPLAINT:  SOB  INITIAL PRESENTATION:  54 y.o. F, smoker,  admitted 2/24 with a 3 day hx of cough and SOB.  On arrival was found to have significant respiratory distress and hypoxia. Patient failed bipap and was intubated with a #6.5 ETT in ER, difficult intubation.   STUDIES:  CTA chest 2/24 >>> no PE, underlying emphysema.  Bilateral PNA, right hilar and mediastinal reactive lymphadenopathy.  SIGNIFICANT EVENTS: 2/24  Admit, intubated 2/25  Levo @ 10 mcg, sedate on vent.  Family concerned she has an 'allergy to albuterol'.   2/26  Weaned to 4 mcg levo, awake on vent, no acute events overnight, aspiration? 2/27  weaning levophed 3/1    right thoracentesis -->transudate  3/2   Extubated. Reintubated for increased work of breathing. Levophed restarted overnight.   SUBJECTIVE:   Doing well on PSV 12, and then to PSV 5 this am.  Much less anxious on precedex.   VITAL SIGNS: Temp:  [97.3 F (36.3 C)-98 F (36.7 C)] 98 F (36.7 C) (03/05 0443) Pulse Rate:  [63-66] 63 (03/05 0332) Resp:  [14-25] 17 (03/05 0900) BP: (90-120)/(37-77) 90/53 mmHg (03/05 0900) SpO2:  [97 %-100 %] 99 % (03/05 0900) FiO2 (%):  [30 %-40 %] 30 % (03/05 0800) Weight:  [46.675 kg (102 lb 14.4 oz)] 46.675 kg (102 lb 14.4 oz) (03/05 0443)   HEMODYNAMICS: CVP:  [4 mmHg-12 mmHg] 7 mmHg   VENTILATOR SETTINGS: Vent Mode:  [-] CPAP FiO2 (%):  [30 %-40 %] 30 % Set Rate:  [14 bmp] 14 bmp Vt Set:  [450 mL] 450 mL PEEP:  [5 cmH20] 5 cmH20 Pressure Support:  [10 cmH20-12 cmH20] 12 cmH20 Plateau Pressure:  [17 cmH20-19 cmH20] 19 cmH20   INTAKE / OUTPUT: Intake/Output      03/04 0701 - 03/05 0700 03/05 0701 - 03/06 0700   I.V. (mL/kg) 768.9 (16.5)    NG/GT 990 100   IV Piggyback     Total Intake(mL/kg) 1758.9 (37.7) 100 (2.1)    Urine (mL/kg/hr) 4330 (3.9) 125 (0.8)   Emesis/NG output     Total Output 4330 125   Net -2571.1 -25          PHYSICAL EXAMINATION:  Gen: on vent, arousable to voice, follows commands HEENT: NCAT ETT in place PULM: clear bilaterally, no wheeze  CV: RRR, no MGR  Ab: BS+, soft, nontender Ext: notable edema in bilateral hands  Neuro: awake, follows commands, equal strength bilaterally  LABS:  PULMONARY  Recent Labs Lab 06/24/14 1358 06/28/14 1242 06/29/14 0548 06/30/14 0402  PHART 7.438 7.434 7.499* 7.532*  PCO2ART 36.9 51.3* 41.7 41.3  PO2ART 84.6 68.4* 107.0* 62.4*  HCO3 24.5* 33.8* 32.1* 34.6*  TCO2 22.1 30.1 28.9 30.1  O2SAT 96.6 92.2 97.9 91.9   CBC  Recent Labs Lab 06/29/14 0530 06/30/14 0445 07/01/14 0515  HGB 10.9* 12.2 11.8*  HCT 34.8* 38.2 36.6  WBC 14.6* 12.8* 13.3*  PLT 313 332 366   CARDIAC No results for input(s): TROPONINI in the last 168 hours. No results for input(s): PROBNP in the last 168 hours.  CHEMISTRY  Recent Labs Lab 06/25/14 0515 06/26/14 0440 06/27/14 0445 06/29/14 0530 06/30/14 0445 06/30/14 2000 07/01/14 0515  NA 141 145 144 140 141 141 142  K 3.9 4.0 3.5 3.1*  2.9* 4.1 3.8  CL 109 105 103 99 98 105 101  CO2 29 37* 38* 33* 36* 32 34*  GLUCOSE 127* 102* 122* 133* 165* 168* 125*  BUN 14 18 19 22 20 20 22   CREATININE 0.39* 0.40* 0.37* 0.38* 0.44* 0.40* 0.35*  CALCIUM 7.7* 8.0* 8.0* 8.1* 8.4 8.1* 8.4  MG 2.2 2.2 2.1 1.9  --   --  2.3  PHOS 2.3 2.9 3.1 3.0  --   --  3.7   Estimated Creatinine Clearance: 59.3 mL/min (by C-G formula based on Cr of 0.35).  LIVER  Recent Labs Lab 06/27/14 0445 07/01/14 0515  AST  --  22  ALT  --  67*  ALKPHOS  --  85  BILITOT  --  0.7  PROT 4.8* 6.3  ALBUMIN  --  2.8*   INFECTIOUS No results for input(s): LATICACIDVEN, PROCALCITON in the last 168 hours. ENDOCRINE CBG (last 3)   Recent Labs  06/30/14 2017 07/01/14 0007 07/01/14 0347  GLUCAP 171* 126* 131*      IMAGING x48h Dg Chest Port 1 View  06/30/2014   CLINICAL DATA:  Respiratory failure, intubated patient.  EXAM: PORTABLE CHEST - 1 VIEW  COMPARISON:  Portable chest x-ray of June 29, 2014  FINDINGS: The lungs are well-expanded. The interstitial markings are diffusely increased but have improved slightly since yesterday's exam. Confluent densities in the left mid lung and right infrahilar early region are less conspicuous as well. The cardiac silhouette is normal in size. The pulmonary vascularity is more distinct and less engorged today. The endotracheal tube tip projects 5.1 cm above the crotch of the carina. The esophagogastric tube tip projects below the inferior margin of the image. The left internal jugular venous catheter tip projects over the midportion of the SVC.  IMPRESSION: Improved appearance of the pulmonary interstitium suggesting improving interstitial edema/pneumonia. Alveolar opacities bilaterally are less conspicuous as well.   Electronically Signed   By: David  Martinique   On: 06/30/2014 07:18     ASSESSMENT / PLAN:  PULMONARY OETT 3/2 (size 7, easy airway with glidescope) A: Acute Hypoxic Respiratory failure - in setting of AECOPD + Flu A + CAP; some pulmonary edema  2/28 Tobacco Abuse - smoker, no baseline PFT's Bilateral R>L pleural effusions -->right transudate by light's criteria  CXR improved. Weaning but mechanics suggest not ready yet.  P:   Looks better, has diuresed well. Goal extubated today on low dose precedex Taper steroids  Cont duoneb Decrease lasix today Trend CXR  Nicotine patch Set up f/u with [facility] on discharge for PFT's and management of presumed COPD.  CARDIOVASCULAR CVL L IJ 2/24 >>> A:  Septic Shock resolved but still some hypotension 2/28 and 2/29 likely due to sedation (nurses note more hypotension to fentanyl than to propofol)--> requires low dose levophed when sedated > improved with precedex P:  Wean levophed as tolerated.  Goal SBP >90.   Allow neg NET goal as BP will allow  CVP q shift Decrease diuresis 3/5  RENAL A:   Hypokalemia  P:   KVO fluids Monitor BMET and UOP Repeat & Replace electrolytes as needed  GASTROINTESTINAL A:   GERD Nutrition Hx diverticulosis, internal hemorrhoids P:   Stress ulcer prophylaxis - Pantoprazole Bowel regimen - senna Currently tolerating tube feeds.   HEMATOLOGIC A:   VTE Prophylaxis P:  SCD's / Lovenox CBC in AM  INFECTIOUS BCx2 2/24 >>neg  UCx 2/24 >>neg  Sputum 2/24 >>neg  RVP 2/24 >>  INFLUENZA A U. Strep 2/25 >> neg U. Legionella 2/25 >>neg Right pleural fluid 3/1>>> no organisms  A:   CAP  P:   Abx: Ceftriaxone, start date 2/24, completed 7d Abx: Azithromycin, start date 2/24, completed 7d Antiviral Tamiflu 2/25 >> 2/29 Completed abx, trend fever and WBC curve   ENDOCRINE A:   No known issues  P:   SSI if glucose consistently > 180.  NEUROLOGIC A:   Acute Metabolic Encephalopathy - in setting of hypercapnia  Hx Fibromyalgia, cluster headaches, back pain P:   PAD protocol  precedex gtt w/ PRN fentanyl Goal RASS - 1 to 0 Supportive care   Family updated: Sister, father, brother updated daily by Jerrye Bushy and Dr Lamonte Sakai    Independent critical care time is 40 minutes.   Baltazar Apo, MD, PhD 07/01/2014, 10:26 AM  [facility]ulmonary [facility] Care [phone] or if no answer [phone]

## 2014-07-02 ENCOUNTER — Inpatient Hospital Stay (HOSPITAL_COMMUNITY): Payer: Medicare Other

## 2014-07-02 LAB — GLUCOSE, CAPILLARY
GLUCOSE-CAPILLARY: 148 mg/dL — AB (ref 70–99)
GLUCOSE-CAPILLARY: 77 mg/dL (ref 70–99)
GLUCOSE-CAPILLARY: 97 mg/dL (ref 70–99)
Glucose-Capillary: 113 mg/dL — ABNORMAL HIGH (ref 70–99)
Glucose-Capillary: 106 mg/dL — ABNORMAL HIGH (ref 70–99)
Glucose-Capillary: 113 mg/dL — ABNORMAL HIGH (ref 70–99)

## 2014-07-02 LAB — BASIC METABOLIC PANEL
Anion gap: 7 (ref 5–15)
BUN: 25 mg/dL — ABNORMAL HIGH (ref 6–23)
CHLORIDE: 107 mmol/L (ref 96–112)
CO2: 32 mmol/L (ref 19–32)
Calcium: 8.6 mg/dL (ref 8.4–10.5)
Creatinine, Ser: 0.39 mg/dL — ABNORMAL LOW (ref 0.50–1.10)
GFR calc Af Amer: >90 mL/min (ref >90)
GFR calc non Af Amer: >90 mL/min (ref >90)
Glucose, Bld: 106 mg/dL — ABNORMAL HIGH (ref 70–99)
POTASSIUM: 3.9 mmol/L (ref 3.5–5.1)
Sodium: 146 mmol/L — ABNORMAL HIGH (ref 135–145)

## 2014-07-02 MED ORDER — LORAZEPAM 2 MG/ML IJ SOLN
0.5000 mg | Freq: Once | INTRAMUSCULAR | Status: AC
  Administered 2014-07-02: 0.5 mg via INTRAVENOUS
  Filled 2014-07-02: qty 1

## 2014-07-02 NOTE — Progress Notes (Signed)
PULMONARY / CRITICAL CARE MEDICINE   Name: Elaine Le MRN: [id number] DOB: [birth year]    ADMISSION DATE:  06/21/2014 CONSULTATION DATE:  07/02/2014  REFERRING MD :  EDP  CHIEF COMPLAINT:  SOB  INITIAL PRESENTATION:  54 y.o. F, smoker,  admitted 2/24 with a 3 day hx of cough and SOB.  On arrival was found to have significant respiratory distress and hypoxia. Patient failed bipap and was intubated with a #6.5 ETT in ER, difficult intubation.   STUDIES:  CTA chest 2/24 >>> no PE, underlying emphysema.  Bilateral PNA, right hilar and mediastinal reactive lymphadenopathy.  SIGNIFICANT EVENTS: 2/24  Admit, intubated 2/25  Levo @ 10 mcg, sedate on vent.  Family concerned she has an 'allergy to albuterol'.   2/26  Weaned to 4 mcg levo, awake on vent, no acute events overnight, aspiration? 2/27  weaning levophed 3/1    right thoracentesis -->transudate  3/2   Extubated. Reintubated for increased work of breathing. Levophed restarted overnight.   SUBJECTIVE:   Extubated, but combination of respiratory distress and anxiety > placed on BiPAP and treated with prn ativan. She stabilized and is comfortable on biPAP this am   VITAL SIGNS: Temp:  [97.6 F (36.4 C)-98.4 F (36.9 C)] 97.6 F (36.4 C) (03/06 0000) Resp:  [17-35] 22 (03/06 0800) BP: (76-170)/(29-117) 113/80 mmHg (03/06 0800) SpO2:  [96 %-100 %] 100 % (03/06 0800) FiO2 (%):  [30 %-40 %] 30 % (03/06 0800) Weight:  [44.906 kg (99 lb)] 44.906 kg (99 lb) (03/06 0500)   HEMODYNAMICS:     VENTILATOR SETTINGS: Vent Mode:  [-] Other (Comment) FiO2 (%):  [30 %-40 %] 30 % Set Rate:  [14 bmp] 14 bmp PEEP:  [5 cmH20] 5 cmH20 Pressure Support:  [10 cmH20] 10 cmH20 Plateau Pressure:  [12 cmH20-14 cmH20] 12 cmH20   INTAKE / OUTPUT: Intake/Output      03/05 0701 - 03/06 0700 03/06 0701 - 03/07 0700   I.V. (mL/kg) 643.1 (14.3) 26.3 (0.6)   NG/GT 257.5    Total Intake(mL/kg) 900.6 (20.1) 26.3 (0.6)   Urine (mL/kg/hr) 1105 (1)  75 (0.9)   Total Output 1105 75   Net -204.4 -48.7          PHYSICAL EXAMINATION:  Gen: thin woman, awake,  HEENT: NCAT, BiPAP in place PULM: clear bilaterally, no wheeze  CV: RRR, no MGR  Ab: BS+, soft, nontender Ext: notable edema in bilateral hands  Neuro: awake, follows commands, equal strength bilaterally  LABS:  PULMONARY  Recent Labs Lab 06/28/14 1242 06/29/14 0548 06/30/14 0402  PHART 7.434 7.499* 7.532*  PCO2ART 51.3* 41.7 41.3  PO2ART 68.4* 107.0* 62.4*  HCO3 33.8* 32.1* 34.6*  TCO2 30.1 28.9 30.1  O2SAT 92.2 97.9 91.9   CBC  Recent Labs Lab 06/29/14 0530 06/30/14 0445 07/01/14 0515  HGB 10.9* 12.2 11.8*  HCT 34.8* 38.2 36.6  WBC 14.6* 12.8* 13.3*  PLT 313 332 366   CARDIAC No results for input(s): TROPONINI in the last 168 hours. No results for input(s): PROBNP in the last 168 hours.  CHEMISTRY  Recent Labs Lab 06/26/14 0440 06/27/14 0445 06/29/14 0530 06/30/14 0445 06/30/14 2000 07/01/14 0515 07/02/14 0536  NA 145 144 140 141 141 142 146*  K 4.0 3.5 3.1* 2.9* 4.1 3.8 3.9  CL 105 103 99 98 105 101 107  CO2 37* 38* 33* 36* 32 34* 32  GLUCOSE 102* 122* 133* 165* 168* 125* 106*  BUN 18 19 22 20  20  22 25*  CREATININE 0.40* 0.37* 0.38* 0.44* 0.40* 0.35* 0.39*  CALCIUM 8.0* 8.0* 8.1* 8.4 8.1* 8.4 8.6  MG 2.2 2.1 1.9  --   --  2.3  --   PHOS 2.9 3.1 3.0  --   --  3.7  --    Estimated Creatinine Clearance: 57 mL/min (by C-G formula based on Cr of 0.39).  LIVER  Recent Labs Lab 06/27/14 0445 07/01/14 0515  AST  --  22  ALT  --  67*  ALKPHOS  --  85  BILITOT  --  0.7  PROT 4.8* 6.3  ALBUMIN  --  2.8*   INFECTIOUS No results for input(s): LATICACIDVEN, PROCALCITON in the last 168 hours. ENDOCRINE CBG (last 3)   Recent Labs  07/01/14 1649 07/01/14 2032 07/02/14 0016  GLUCAP 124* 101* 97     IMAGING x48h Dg Chest Port 1 View  07/02/2014   CLINICAL DATA:  Acute respiratory failure. Community acquired pneumonia. Pleural  effusion.  EXAM: PORTABLE CHEST - 1 VIEW  COMPARISON:  06/30/2014  FINDINGS: Endotracheal tube and nasogastric tube been removed. Left jugular central venous catheter remains in appropriate position. No pneumothorax visualized.  Diffuse interstitial infiltrates show no significant change, consistent with diffuse interstitial edema. Small bilateral pleural effusions are also stable. Decreased pulmonary opacity noted in the left retrocardiac lung base. Heart size is within normal limits.  IMPRESSION: Improving pulmonary opacity in left retrocardiac lung base.  Diffuse interstitial edema pattern and small bilateral pleural effusions, without significant change.   Electronically Signed   By: Earle Gell M.D.   On: 07/02/2014 07:17     ASSESSMENT / PLAN:  PULMONARY OETT 3/2 (size 7, easy airway with glidescope) > 3/5 A: Acute Hypoxic Respiratory failure - in setting of AECOPD + Flu A + CAP; some pulmonary edema  2/28 Tobacco Abuse - smoker, no baseline PFT's Bilateral R>L pleural effusions -->right transudate by light's criteria  CXR improved. Weaning but mechanics suggest not ready yet.  P:   Tolerated extubation with BiPAP as bridge; will attempt to decrease frequency Taper steroids  Cont duoneb Hold lasix given rising Na Follow CXR intermittently Nicotine patch Set up f/u with [facility] on discharge for PFT's and management of presumed COPD.  CARDIOVASCULAR CVL L IJ 2/24 >>> A:  Septic Shock resolved  P:  Wean levophed as tolerated. Goal SBP >90.   Allow neg NET goal as BP will allow  D/c CVP's  RENAL A:   Hypokalemia  P:   KVO fluids Monitor BMET and UOP Repeat & Replace electrolytes as needed  GASTROINTESTINAL A:   GERD Nutrition Hx diverticulosis, internal hemorrhoids P:   Stress ulcer prophylaxis - Pantoprazole Bowel regimen - senna Currently tolerating tube feeds.   HEMATOLOGIC A:   VTE Prophylaxis P:  SCD's / Lovenox CBC in AM  INFECTIOUS BCx2 2/24  >>neg  UCx 2/24 >>neg  Sputum 2/24 >>neg  RVP 2/24 >> INFLUENZA A U. Strep 2/25 >> neg U. Legionella 2/25 >>neg Right pleural fluid 3/1>>> no organisms  A:   CAP  P:   Abx: Ceftriaxone, start date 2/24, completed 7d Abx: Azithromycin, start date 2/24, completed 7d Antiviral Tamiflu 2/25 >> 2/29 Completed abx, trend fever and WBC curve   ENDOCRINE A:   No known issues  P:   SSI if glucose consistently > 180.  NEUROLOGIC A:   Acute Metabolic Encephalopathy - in setting of hypercapnia  Hx Fibromyalgia, cluster headaches, back pain P:   PAD  protocol  precedex gtt > plan wean to off 3/6 Goal RASS - 0 to 1 Will use ativan prn  Family updated: daily by Dr Lamonte Sakai     Baltazar Apo, MD, PhD 07/02/2014, 8:52 AM [facility] [phone] or if no answer [phone]

## 2014-07-02 NOTE — Progress Notes (Signed)
SLP Cancellation Note  Patient Details Name: Elaine Le MRN: [id number] DOB: 12-05-1960   Cancelled treatment:        Pt on and off BiPAP today per MD notes. Will f/u for swallow evaluation tomorrow.    Andreah Goheen, Katherene Ponto 07/02/2014, 1:03 PM

## 2014-07-02 NOTE — Progress Notes (Signed)
PT Cancellation Note  Patient Details Name: Elaine Le MRN: [id number] DOB: 12/25/1960   Cancelled Treatment:     PT deferred based on pt current condition and following discussion with RN.  Will follow in am.   Juston Goheen 07/02/2014, 8:24 AM

## 2014-07-02 NOTE — Progress Notes (Signed)
Pt unable to get OOB today d/t increased WOB - off and on bipap. PT unable to see her today, but will see tomorrow.

## 2014-07-03 ENCOUNTER — Inpatient Hospital Stay (HOSPITAL_COMMUNITY): Payer: Medicare Other

## 2014-07-03 DIAGNOSIS — G9340 Encephalopathy, unspecified: Secondary | ICD-10-CM

## 2014-07-03 LAB — BASIC METABOLIC PANEL
Anion gap: 6 (ref 5–15)
BUN: 20 mg/dL (ref 6–23)
CHLORIDE: 112 mmol/L (ref 96–112)
CO2: 30 mmol/L (ref 19–32)
CREATININE: 0.4 mg/dL — AB (ref 0.50–1.10)
Calcium: 8.4 mg/dL (ref 8.4–10.5)
GFR calc Af Amer: >90 mL/min (ref >90)
GFR calc non Af Amer: >90 mL/min (ref >90)
Glucose, Bld: 103 mg/dL — ABNORMAL HIGH (ref 70–99)
Potassium: 3.2 mmol/L — ABNORMAL LOW (ref 3.5–5.1)
Sodium: 148 mmol/L — ABNORMAL HIGH (ref 135–145)

## 2014-07-03 LAB — BLOOD GAS, ARTERIAL
ACID-BASE EXCESS: 6.6 mmol/L — AB (ref 0.0–2.0)
BICARBONATE: 29.7 meq/L — AB (ref 20.0–24.0)
Drawn by: 295031
O2 CONTENT: 2 L/min
O2 Saturation: 90.7 %
PCO2 ART: 37.7 mmHg (ref 35.0–45.0)
PO2 ART: 58.4 mmHg — AB (ref 80.0–100.0)
Patient temperature: 98.6
TCO2: 25.8 mmol/L (ref 0–100)
pH, Arterial: 7.507 — ABNORMAL HIGH (ref 7.350–7.450)

## 2014-07-03 LAB — GLUCOSE, CAPILLARY
Glucose-Capillary: 73 mg/dL (ref 70–99)
Glucose-Capillary: 84 mg/dL (ref 70–99)
Glucose-Capillary: 114 mg/dL — ABNORMAL HIGH (ref 70–99)
Glucose-Capillary: 189 mg/dL — ABNORMAL HIGH (ref 70–99)
Glucose-Capillary: 158 mg/dL — ABNORMAL HIGH (ref 70–99)
Glucose-Capillary: 120 mg/dL — ABNORMAL HIGH (ref 70–99)

## 2014-07-03 MED ORDER — KETOROLAC TROMETHAMINE 30 MG/ML IJ SOLN
30.0000 mg | Freq: Once | INTRAMUSCULAR | Status: AC
  Administered 2014-07-04: 30 mg via INTRAVENOUS
  Filled 2014-07-03: qty 1

## 2014-07-03 MED ORDER — BUDESONIDE 0.25 MG/2ML IN SUSP
0.2500 mg | Freq: Two times a day (BID) | RESPIRATORY_TRACT | Status: DC
  Administered 2014-07-03 – 2014-07-06 (×8): 0.25 mg via RESPIRATORY_TRACT
  Filled 2014-07-03 (×8): qty 2

## 2014-07-03 MED ORDER — FUROSEMIDE 10 MG/ML IJ SOLN
40.0000 mg | Freq: Two times a day (BID) | INTRAMUSCULAR | Status: AC
  Administered 2014-07-03 (×2): 40 mg via INTRAVENOUS
  Filled 2014-07-03 (×2): qty 4

## 2014-07-03 MED ORDER — DEXTROSE 5 % IV SOLN
INTRAVENOUS | Status: DC
  Administered 2014-07-03 – 2014-07-04 (×2): via INTRAVENOUS
  Administered 2014-07-05: 999 mL via INTRAVENOUS

## 2014-07-03 MED ORDER — PIPERACILLIN-TAZOBACTAM 3.375 G IVPB 30 MIN
3.3750 g | Freq: Once | INTRAVENOUS | Status: AC
  Administered 2014-07-03: 3.375 g via INTRAVENOUS
  Filled 2014-07-03 (×2): qty 50

## 2014-07-03 MED ORDER — LORAZEPAM 2 MG/ML IJ SOLN: 0.5000 mg | Freq: Three times a day (TID) | INTRAMUSCULAR | Status: DC | PRN

## 2014-07-03 MED ORDER — ARFORMOTEROL TARTRATE 15 MCG/2ML IN NEBU
15.0000 ug | INHALATION_SOLUTION | Freq: Two times a day (BID) | RESPIRATORY_TRACT | Status: DC
  Administered 2014-07-03 – 2014-07-06 (×8): 15 ug via RESPIRATORY_TRACT
  Filled 2014-07-03 (×9): qty 2

## 2014-07-03 MED ORDER — POTASSIUM CHLORIDE 10 MEQ/50ML IV SOLN
10.0000 meq | INTRAVENOUS | Status: AC
  Administered 2014-07-03 (×6): 10 meq via INTRAVENOUS
  Filled 2014-07-03 (×5): qty 50

## 2014-07-03 MED ORDER — VANCOMYCIN HCL IN DEXTROSE 1-5 GM/200ML-% IV SOLN
1000.0000 mg | INTRAVENOUS | Status: DC
  Administered 2014-07-03 – 2014-07-05 (×3): 1000 mg via INTRAVENOUS
  Filled 2014-07-03 (×3): qty 200

## 2014-07-03 MED ORDER — PIPERACILLIN-TAZOBACTAM 3.375 G IVPB
3.3750 g | Freq: Three times a day (TID) | INTRAVENOUS | Status: DC
  Administered 2014-07-03 – 2014-07-06 (×8): 3.375 g via INTRAVENOUS
  Filled 2014-07-03 (×9): qty 50

## 2014-07-03 MED ORDER — HALOPERIDOL LACTATE 5 MG/ML IJ SOLN
1.0000 mg | INTRAMUSCULAR | Status: DC | PRN
  Administered 2014-07-03 (×2): 2 mg via INTRAVENOUS
  Filled 2014-07-03: qty 1

## 2014-07-03 NOTE — Progress Notes (Signed)
eLink Physician-Brief Progress Note Patient Name: Elaine Le DOB: 1961-03-14 MRN: [id number]   Date of Service  07/03/2014  HPI/Events of Note  Back pain not relieved by fentanyl.  HD stable with normal renal function  eICU Interventions  Plan: One time dose of 30 mg Toradol IV     Intervention Category Intermediate Interventions: Pain - evaluation and management  DETERDING,ELIZABETH 07/03/2014, 11:24 PM

## 2014-07-03 NOTE — Progress Notes (Signed)
PULMONARY / CRITICAL CARE MEDICINE   Name: Elaine Le MRN: [id number] DOB: 16-Jan-1961    ADMISSION DATE:  06/21/2014 CONSULTATION DATE:  07/03/2014  REFERRING MD :  EDP  CHIEF COMPLAINT:  SOB  INITIAL PRESENTATION:  54 y.o. F, smoker,  admitted 2/24 with a 3 day hx of cough and SOB.  On arrival was found to have significant respiratory distress and hypoxia. Patient failed bipap and was intubated with a #6.5 ETT in ER, difficult intubation.   STUDIES:  CTA chest 2/24 >>> no PE, underlying emphysema.  Bilateral PNA, right hilar and mediastinal reactive lymphadenopathy.  SIGNIFICANT EVENTS: 2/24  Admit, intubated 2/25  Levo @ 10 mcg, sedate on vent.  Family concerned she has an 'allergy to albuterol'.   2/26  Weaned to 4 mcg levo, awake on vent, no acute events overnight, aspiration? 2/27  weaning levophed 3/1    right thoracentesis -->transudate  3/2   Extubated. Reintubated for increased work of breathing. Levophed restarted overnight.  3/4 extubated 3/7 on BIPAP on and off since extubation. Work of breathing worse.   SUBJECTIVE:   Extubated, more anxious this am.   VITAL SIGNS: Temp:  [97.9 F (36.6 C)-99.2 F (37.3 C)] 99.2 F (37.3 C) (03/07 0000) Resp:  [20-62] 23 (03/07 0900) BP: (90-165)/(51-101) 126/70 mmHg (03/07 0900) SpO2:  [82 %-100 %] 98 % (03/07 0917) FiO2 (%):  [30 %] 30 % (03/07 0917) Weight:  [46.539 kg (102 lb 9.6 oz)] 46.539 kg (102 lb 9.6 oz) (03/07 0500)   HEMODYNAMICS:     VENTILATOR SETTINGS: Vent Mode:  [-] Other (Comment) FiO2 (%):  [30 %] 30 % Set Rate:  [14 bmp] 14 bmp PEEP:  [5 cmH20] 5 cmH20 Plateau Pressure:  [14 cmH20] 14 cmH20   INTAKE / OUTPUT: Intake/Output      03/06 0701 - 03/07 0700 03/07 0701 - 03/08 0700   I.V. (mL/kg) 262.8 (5.6) 20 (0.4)   Other 0    NG/GT     Total Intake(mL/kg) 262.8 (5.6) 20 (0.4)   Urine (mL/kg/hr) 1086 (1) 45 (0.4)   Total Output 1086 45   Net -823.2 -25          PHYSICAL  EXAMINATION:  Gen: thin woman, sedated on bipap HEENT: NCAT, BiPAP in place PULM: diffuse exp wheeze w/ rhonchi  CV: RRR, no MGR  Ab: BS+, soft, nontender Ext: notable edema in bilateral hands  Neuro: awake, follows commands, equal strength bilaterally  LABS:  PULMONARY  Recent Labs Lab 06/28/14 1242 06/29/14 0548 06/30/14 0402  PHART 7.434 7.499* 7.532*  PCO2ART 51.3* 41.7 41.3  PO2ART 68.4* 107.0* 62.4*  HCO3 33.8* 32.1* 34.6*  TCO2 30.1 28.9 30.1  O2SAT 92.2 97.9 91.9   CBC  Recent Labs Lab 06/29/14 0530 06/30/14 0445 07/01/14 0515  HGB 10.9* 12.2 11.8*  HCT 34.8* 38.2 36.6  WBC 14.6* 12.8* 13.3*  PLT 313 332 366   CARDIAC No results for input(s): TROPONINI in the last 168 hours. No results for input(s): PROBNP in the last 168 hours.  CHEMISTRY  Recent Labs Lab 06/27/14 0445 06/29/14 0530 06/30/14 0445 06/30/14 2000 07/01/14 0515 07/02/14 0536 07/03/14 0601  NA 144 140 141 141 142 146* 148*  K 3.5 3.1* 2.9* 4.1 3.8 3.9 3.2*  CL 103 99 98 105 101 107 112  CO2 38* 33* 36* 32 34* 32 30  GLUCOSE 122* 133* 165* 168* 125* 106* 103*  BUN 19 22 20 20 22  25* 20  CREATININE 0.37*  0.38* 0.44* 0.40* 0.35* 0.39* 0.40*  CALCIUM 8.0* 8.1* 8.4 8.1* 8.4 8.6 8.4  MG 2.1 1.9  --   --  2.3  --   --   PHOS 3.1 3.0  --   --  3.7  --   --    Estimated Creatinine Clearance: 59 mL/min (by C-G formula based on Cr of 0.4).  LIVER  Recent Labs Lab 06/27/14 0445 07/01/14 0515  AST  --  22  ALT  --  67*  ALKPHOS  --  85  BILITOT  --  0.7  PROT 4.8* 6.3  ALBUMIN  --  2.8*   INFECTIOUS No results for input(s): LATICACIDVEN, PROCALCITON in the last 168 hours. ENDOCRINE CBG (last 3)   Recent Labs  07/03/14 0054 07/03/14 0444 07/03/14 0822  GLUCAP 73 84 114*    IMAGING x48h Dg Chest Port 1 View  07/02/2014   CLINICAL DATA:  Acute respiratory failure. Community acquired pneumonia. Pleural effusion.  EXAM: PORTABLE CHEST - 1 VIEW  COMPARISON:  06/30/2014   FINDINGS: Endotracheal tube and nasogastric tube been removed. Left jugular central venous catheter remains in appropriate position. No pneumothorax visualized.  Diffuse interstitial infiltrates show no significant change, consistent with diffuse interstitial edema. Small bilateral pleural effusions are also stable. Decreased pulmonary opacity noted in the left retrocardiac lung base. Heart size is within normal limits.  IMPRESSION: Improving pulmonary opacity in left retrocardiac lung base.  Diffuse interstitial edema pattern and small bilateral pleural effusions, without significant change.   Electronically Signed   By: Earle Gell M.D.   On: 07/02/2014 07:17  aeration worse  ASSESSMENT / PLAN:  PULMONARY OETT 3/2 (size 7, easy airway with glidescope) > 3/5 A: Acute Hypoxic Respiratory failure - in setting of AECOPD + Flu A + CAP; some pulmonary edema  2/28 Tobacco Abuse - smoker, no baseline PFT's Bilateral R>L pleural effusions -->right transudate by light's criteria  Back on BIPAP/ work of breathing worse. Edema on CXR 3/6   P:   Cont BIPAP PRN Repeat ABG Taper steroids  Add budesonide and brovana w/ scheduled duoneb  Follow CXR intermittently Nicotine patch Set up f/u with [facility] on discharge for PFT's and management of presumed COPD.  CARDIOVASCULAR CVL L IJ 2/24 >>> A:  Septic Shock resolved  P:  Allow neg NET goal as BP will allow  D/c CVP's  RENAL A:   Hypokalemia  Hypernatremia  P:   KVO fluids Monitor BMET and UOP Free water replacement  Repeat & Replace electrolytes as needed  GASTROINTESTINAL A:   GERD Nutrition Hx diverticulosis, internal hemorrhoids P:   Stress ulcer prophylaxis - Pantoprazole Bowel regimen - senna Currently tolerating tube feeds.   HEMATOLOGIC A:   VTE Prophylaxis P:  SCD's / Lovenox CBC in AM  INFECTIOUS BCx2 2/24 >>neg  UCx 2/24 >>neg  Sputum 2/24 >>neg  RVP 2/24 >> INFLUENZA A U. Strep 2/25 >> neg U.  Legionella 2/25 >>neg Right pleural fluid 3/1>>> no organisms  A:   CAP  P:   Abx: Ceftriaxone, start date 2/24, completed 7d Abx: Azithromycin, start date 2/24, completed 7d Antiviral Tamiflu 2/25 >> 2/29 Completed abx, trend fever and WBC curve   ENDOCRINE A:   No known issues  P:   SSI if glucose consistently > 180.  NEUROLOGIC A:   Acute Metabolic Encephalopathy - in setting of hypercapnia  Hx Fibromyalgia, cluster headaches, back pain P:   PAD protocol  precedex gtt > plan wean  to off 3/6 Goal RASS - 0 to 1 Will use ativan prn  Family updated: daily by Kary Kos   np summary  I am concerned that she looks worse. Since extubation she has required NIPPV on and off. Over the last 24 hrs she has been on it more than off. Her CXR still shows bilateral airspace disease. Likely mix of both edema and effusion. Anxiety had been an issue over the weekend. This am she was actually agitated. She is now sedated on NIPPV. She is demonstrating + accessory muscle use on exam seems more bronchospastic. Will check ABG, repeat CXR, add back diuretics, and add brovana and budesonide to maximize bronchodilation. She appears very weak. She may simply just need more time to get over this. Alternatively also consider new HCAP, although not spiking fever her secretions that nursing are suctioning appear purulent. Will go ahead and add HCAP coverage as well.   Erick Colace ACNP-BC [place] Pager # [phone] OR # [phone] if no answer   MD statement    Attending:  I have seen and examined the patient with nurse practitioner/resident and agree with the note above.   I'm surprised by [name]'s ability to oxygenate well this morning.  Though she has rhonchi on my exam she has been wheezing this morning some, but in general I don't feel strongly she needs to be intubated this morning.  Her CXR does look like pulmonary edema, will continue diuresis.  She has bilateral  effusions which are likely related to the same as we know these are transudates.  Will try to avoid benzodiazepines as I think they have contributed to the frank delirium we can see on exam this morning.  Continue prn BIPAP. Still high risk for intubation.  If intubated, will need tracheostomy.  I have updated her entire family at length this morning.    My cc time 40 minutes  Roselie Awkward, MD [facility] Pager: [phone] Cell: [phone] If no response, call [phone]

## 2014-07-03 NOTE — Progress Notes (Signed)
PT Cancellation Note  Patient Details Name: Elaine Le MRN: [id number] DOB: [birth year]   Cancelled Treatment:    Reason Eval/Treat Not Completed: Patient not medically ready per RN. RN requests PT check back on tomorrow.    Weston Anna, MPT Pager: [phone]

## 2014-07-03 NOTE — Progress Notes (Signed)
SLP Cancellation Note  Patient Details Name: Elaine Le MRN: [id number] DOB: January 08, 1961   Cancelled treatment:       Reason Eval/Treat Not Completed: Patient not medically ready;Medical issues which prohibited therapy. Pt continues to require BIPAP. ST to continue efforts.  Frederick Klinger B. Quentin Ore [facility], CCC-SLP [phone]  Shonna Chock 07/03/2014, 8:58 AM

## 2014-07-03 NOTE — Progress Notes (Addendum)
ANTIBIOTIC CONSULT NOTE - INITIAL  Pharmacy Consult for Vancomycin/Zosyn Indication: HCAP  Allergies  Allergen Reactions  . Albuterol   . Morphine And Related     Altered mental state    Patient Measurements: Height: 5\' 4"  (162.6 cm) Weight: 102 lb 9.6 oz (46.539 kg) IBW/kg (Calculated) : 54.7 Adjusted Body Weight: n/a  Vital Signs: Temp: 98.4 F (36.9 C) (03/07 0800) Temp Source: Oral (03/07 0800) BP: 126/70 mmHg (03/07 0900) Intake/Output from previous day: 03/06 0701 - 03/07 0700 In: 262.8 [I.V.:262.8] Out: 1086 [Urine:1086] Intake/Output from this shift: Total I/O In: 20 [I.V.:20] Out: 45 [Urine:45]  Labs:  Recent Labs  07/01/14 0515 07/02/14 0536 07/03/14 0601  WBC 13.3*  --   --   HGB 11.8*  --   --   PLT 366  --   --   CREATININE 0.35* 0.39* 0.40*   Estimated Creatinine Clearance: 59 mL/min (by C-G formula based on Cr of 0.4). No results for input(s): VANCOTROUGH, VANCOPEAK, VANCORANDOM, GENTTROUGH, GENTPEAK, GENTRANDOM, TOBRATROUGH, TOBRAPEAK, TOBRARND, AMIKACINPEAK, AMIKACINTROU, AMIKACIN in the last 72 hours.   Microbiology: Recent Results (from the past 720 hour(s))  Blood culture (routine x 2)     Status: None   Collection Time: 06/21/14  5:51 PM  Result Value Ref Range Status   Specimen Description BLOOD LEFT ARM  Final   Special Requests BOTTLES DRAWN AEROBIC ONLY 5CC  Final   Culture   Final    NO GROWTH 5 DAYS Performed at [facility]    Report Status 06/27/2014 FINAL  Final  Blood culture (routine x 2)     Status: None   Collection Time: 06/21/14  5:55 PM  Result Value Ref Range Status   Specimen Description BLOOD RIGHT ANTECUBITAL  Final   Special Requests BOTTLES DRAWN AEROBIC AND ANAEROBIC 5CC EACH  Final   Culture   Final    NO GROWTH 5 DAYS Performed at [facility]    Report Status 06/27/2014 FINAL  Final  MRSA PCR Screening     Status: None   Collection Time: 06/21/14 10:45 PM  Result Value Ref Range  Status   MRSA by PCR NEGATIVE NEGATIVE Final    Comment:        The GeneXpert MRSA Assay (FDA approved for NASAL specimens only), is one component of a comprehensive MRSA colonization surveillance program. It is not intended to diagnose MRSA infection nor to guide or monitor treatment for MRSA infections.   Respiratory virus panel (routine influenza)     Status: Abnormal   Collection Time: 06/22/14  5:42 AM  Result Value Ref Range Status   Respiratory Syncytial Virus A Negative Negative Final   Respiratory Syncytial Virus B Negative Negative Final   Influenza A Positive (A) Negative Final    Comment: Positive for Influenza A 2009 H1N1   Influenza B Negative Negative Final   Parainfluenza 1 Negative Negative Final   Parainfluenza 2 Negative Negative Final   Parainfluenza 3 Negative Negative Final   Metapneumovirus Negative Negative Final   Rhinovirus Negative Negative Final   Adenovirus Negative Negative Final    Comment: (NOTE) Performed At: [facility] [address] [place], [place] [id number] Lindon Romp MD XL:[id number]   Culture, respiratory (NON-Expectorated)     Status: None   Collection Time: 06/22/14  4:39 PM  Result Value Ref Range Status   Specimen Description TRACHEAL ASPIRATE  Final   Special Requests NONE  Final   Gram Stain   Final  RARE WBC PRESENT, PREDOMINANTLY PMN NO SQUAMOUS EPITHELIAL CELLS SEEN NO ORGANISMS SEEN Performed at [facility]    Culture   Final    NO GROWTH 2 DAYS Performed at [facility]    Report Status 06/24/2014 FINAL  Final  Body fluid culture     Status: None   Collection Time: 06/27/14 11:00 AM  Result Value Ref Range Status   Specimen Description PLEURAL  Final   Special Requests PLEURAL  Final   Gram Stain   Final    NO WBC SEEN NO ORGANISMS SEEN Performed at [facility]    Culture   Final    NO GROWTH 3 DAYS Performed at [facility]    Report Status 07/01/2014  FINAL  Final    Medical History: Past Medical History  Diagnosis Date  . Chronic sinusitis   . Arthritis   . Fibromyalgia   . GERD (gastroesophageal reflux disease)   . Irritable bowel syndrome (IBS)   . Headaches, cluster   . Back pain   . Fatigue   . Muscle pain   . Night sweats   . Continuous urine leakage   . Diverticulosis   . Adenomatous colon polyp   . Internal hemorrhoids   . Hiatal hernia     Medications:  Scheduled:  . antiseptic oral rinse  7 mL Mouth Rinse QID  . arformoterol  15 mcg Nebulization Q12H  . budesonide (PULMICORT) nebulizer solution  0.25 mg Nebulization Q12H  . chlorhexidine  15 mL Mouth Rinse BID  . enoxaparin (LOVENOX) injection  40 mg Subcutaneous Q24H  . furosemide  40 mg Intravenous Q12H  . insulin aspart  0-15 Units Subcutaneous 6 times per day  . ipratropium-albuterol  3 mL Nebulization Q4H  . methylPREDNISolone (SOLU-MEDROL) injection  40 mg Intravenous Daily  . nicotine  21 mg Transdermal Daily  . pantoprazole (PROTONIX) IV  40 mg Intravenous Daily  . potassium chloride  10 mEq Intravenous Q1 Hr x 6   Infusions:  . dextrose 40 mL/hr at 07/03/14 0956  . feeding supplement (VITAL AF 1.2 CAL) Stopped (07/01/14 1030)   PRN: albuterol, fentaNYL, LORazepam, sennosides  Assessment: 53 yoF admitted 2/24 with SOB and cough x 2 days PTA. CT angio performed on admission shows bilateral infiltrates. Pt noted with agonal breathing and to be acidotic in the ED, was intubated, to be admitted to the ICU. Initially received  Ceftriaxone/Azithromycin x 8d for CAP, now with increased opacity in LLL, and pulmonary secretions obtained from suctioning appear purulent.  Now to start vanc/Zosyn for possible HCAP  2/24 >> ceftriaxone >> 3/2 2/24 >> azithromycin >> 3/2 2/25 >> Tamiflu >> 2/29 3/7 >> vancomycin >> 3/7 >> Zosyn >>  Tmax: remains afebile WBCs: stable around 12 (on solumedrol), last CBC 3/4 Renal: SCr 0.4- stable, CrCl 59 using SCr  0.8  2/24 blood x2: NGF 2/24 flu panel: + influenza A 2/24 resp virus panel: + influenza A 2/24 S pneumo Ur Ag: negative 2/24 Legionella Ur Ag: negative 2/24 MRSA PCR: negative 2/25 trach aspirate: NGF 3/1 pleural fluid: NGF   Goal of Therapy:   Vancomycin trough level 15-20 mcg/ml   Eradication of infection  Appropriate antibiotic dosing for indication and renal function  Plan:   Begin vancomycin 1 g IV  q24h.  Begin Zosyn 3.375 g IV x 1 over 30 min, then q8hr by 4 hr infusion   Measure vancomycin trough levels at steady state as indicated  Follow  clinical course, culture results as available, renal function  Follow for de-escalation of antibiotics and LOT   Reuel Boom, PharmD Pager: [phone] 07/03/2014, 10:42 AM

## 2014-07-04 ENCOUNTER — Inpatient Hospital Stay (HOSPITAL_COMMUNITY): Payer: Medicare Other

## 2014-07-04 DIAGNOSIS — J9600 Acute respiratory failure, unspecified whether with hypoxia or hypercapnia: Secondary | ICD-10-CM

## 2014-07-04 HISTORY — PX: TRANSTHORACIC ECHOCARDIOGRAM: SHX275

## 2014-07-04 LAB — GLUCOSE, CAPILLARY
GLUCOSE-CAPILLARY: 128 mg/dL — AB (ref 70–99)
GLUCOSE-CAPILLARY: 110 mg/dL — AB (ref 70–99)
Glucose-Capillary: 109 mg/dL — ABNORMAL HIGH (ref 70–99)
Glucose-Capillary: 118 mg/dL — ABNORMAL HIGH (ref 70–99)
Glucose-Capillary: 120 mg/dL — ABNORMAL HIGH (ref 70–99)
Glucose-Capillary: 147 mg/dL — ABNORMAL HIGH (ref 70–99)
Glucose-Capillary: 222 mg/dL — ABNORMAL HIGH (ref 70–99)

## 2014-07-04 LAB — COMPREHENSIVE METABOLIC PANEL
ALT: 51 U/L — ABNORMAL HIGH (ref 0–35)
AST: 19 U/L (ref 0–37)
Albumin: 2.9 g/dL — ABNORMAL LOW (ref 3.5–5.2)
Alkaline Phosphatase: 91 U/L (ref 39–117)
Anion gap: 6 (ref 5–15)
BUN: 20 mg/dL (ref 6–23)
CO2: 35 mmol/L — AB (ref 19–32)
CREATININE: 0.54 mg/dL (ref 0.50–1.10)
Calcium: 8.4 mg/dL (ref 8.4–10.5)
Chloride: 98 mmol/L (ref 96–112)
GFR calc Af Amer: >90 mL/min (ref >90)
GFR calc non Af Amer: >90 mL/min (ref >90)
Glucose, Bld: 127 mg/dL — ABNORMAL HIGH (ref 70–99)
Potassium: 3.1 mmol/L — ABNORMAL LOW (ref 3.5–5.1)
Sodium: 139 mmol/L (ref 135–145)
Total Bilirubin: 0.7 mg/dL (ref 0.3–1.2)
Total Protein: 6.3 g/dL (ref 6.0–8.3)

## 2014-07-04 LAB — CBC
HCT: 37 % (ref 36.0–46.0)
Hemoglobin: 11.8 g/dL — ABNORMAL LOW (ref 12.0–15.0)
MCH: 30.1 pg (ref 26.0–34.0)
MCHC: 31.9 g/dL (ref 30.0–36.0)
MCV: 94.4 fL (ref 78.0–100.0)
Platelets: 445 10*3/uL — ABNORMAL HIGH (ref 150–400)
RBC: 3.92 MIL/uL (ref 3.87–5.11)
RDW: 14.9 % (ref 11.5–15.5)
WBC: 12.1 10*3/uL — ABNORMAL HIGH (ref 4.0–10.5)

## 2014-07-04 MED ORDER — MAGNESIUM SULFATE 2 GM/50ML IV SOLN
2.0000 g | Freq: Once | INTRAVENOUS | Status: AC
  Administered 2014-07-04: 2 g via INTRAVENOUS
  Filled 2014-07-04: qty 50

## 2014-07-04 MED ORDER — ACETAMINOPHEN 325 MG PO TABS
650.0000 mg | ORAL_TABLET | ORAL | Status: DC | PRN
  Administered 2014-07-04: 650 mg via ORAL
  Filled 2014-07-04: qty 2

## 2014-07-04 MED ORDER — RESOURCE THICKENUP CLEAR PO POWD
ORAL | Status: DC | PRN
  Filled 2014-07-04: qty 125

## 2014-07-04 MED ORDER — FUROSEMIDE 10 MG/ML IJ SOLN
40.0000 mg | Freq: Once | INTRAMUSCULAR | Status: AC
  Administered 2014-07-04: 40 mg via INTRAVENOUS
  Filled 2014-07-04: qty 4

## 2014-07-04 MED ORDER — SODIUM CHLORIDE 0.9 % IV SOLN
INTRAVENOUS | Status: DC
  Administered 2014-07-04: 10 mL/h via INTRAVENOUS

## 2014-07-04 MED ORDER — POTASSIUM CHLORIDE 10 MEQ/100ML IV SOLN
10.0000 meq | INTRAVENOUS | Status: AC
  Administered 2014-07-04 (×6): 10 meq via INTRAVENOUS
  Filled 2014-07-04 (×6): qty 100

## 2014-07-04 NOTE — Progress Notes (Signed)
PULMONARY / CRITICAL CARE MEDICINE   Name: Elaine Le MRN: [id number] DOB: [birth year]    ADMISSION DATE:  06/21/2014 CONSULTATION DATE:  07/04/2014  REFERRING MD :  EDP  CHIEF COMPLAINT:  SOB  INITIAL PRESENTATION:  54 y.o. F, smoker,  admitted 2/24 with a 3 day hx of cough and SOB.  On arrival was found to have significant respiratory distress and hypoxia. Patient failed bipap and was intubated with a #6.5 ETT in ER, difficult intubation.   STUDIES:  CTA chest 2/24 >>> no PE, underlying emphysema.  Bilateral PNA, right hilar and mediastinal reactive lymphadenopathy. Echo 3/8 >>>  SIGNIFICANT EVENTS: 2/24  Admit, intubated 2/25  Levo @ 10 mcg, sedate on vent.  Family concerned she has an 'allergy to albuterol'.   2/26  Weaned to 4 mcg levo, awake on vent, no acute events overnight, aspiration? 2/27  weaning levophed 3/1    right thoracentesis -->transudate  3/2   Extubated. Reintubated for increased work of breathing. Levophed restarted overnight.  3/4 extubated 3/7 on BIPAP on and off since extubation. Work of breathing worse.   SUBJECTIVE:   Extubated, improved work of breathing. Denies pain.   VITAL SIGNS: Temp:  [97.9 F (36.6 C)-98.6 F (37 C)] 98.5 F (36.9 C) (03/08 0400) Resp:  [15-28] 15 (03/08 0900) BP: (90-149)/(42-123) 99/51 mmHg (03/08 0900) SpO2:  [95 %-100 %] 96 % (03/08 0900) Weight:  [48.1 kg (106 lb 0.7 oz)] 48.1 kg (106 lb 0.7 oz) (03/08 0500)   HEMODYNAMICS:     VENTILATOR SETTINGS:    3L [place]   INTAKE / OUTPUT: Intake/Output      03/07 0701 - 03/08 0700 03/08 0701 - 03/09 0700   I.V. (mL/kg) 832.2 (17.3) 80 (1.7)   Other     IV Piggyback 650    Total Intake(mL/kg) 1482.2 (30.8) 80 (1.7)   Urine (mL/kg/hr) 3920 (3.4) 100 (0.9)   Total Output 3920 100   Net -2437.8 -20          PHYSICAL EXAMINATION:  Gen: thin woman, sleeping  HEENT: NCAT. Mucous membranes moist.  PULM: rhonchi, occasional wheeze.  CV: RRR, no MGR  Ab: BS+,  soft, nontender Ext: notable edema in bilateral hands  Neuro: awake, follows commands, equal strength bilaterally  LABS:  PULMONARY  Recent Labs Lab 06/28/14 1242 06/29/14 0548 06/30/14 0402 07/03/14 1057  PHART 7.434 7.499* 7.532* 7.507*  PCO2ART 51.3* 41.7 41.3 37.7  PO2ART 68.4* 107.0* 62.4* 58.4*  HCO3 33.8* 32.1* 34.6* 29.7*  TCO2 30.1 28.9 30.1 25.8  O2SAT 92.2 97.9 91.9 90.7   CBC  Recent Labs Lab 06/30/14 0445 07/01/14 0515 07/04/14 0530  HGB 12.2 11.8* 11.8*  HCT 38.2 36.6 37.0  WBC 12.8* 13.3* 12.1*  PLT 332 366 445*   CARDIAC No results for input(s): TROPONINI in the last 168 hours. No results for input(s): PROBNP in the last 168 hours.  CHEMISTRY  Recent Labs Lab 06/29/14 0530  06/30/14 2000 07/01/14 0515 07/02/14 0536 07/03/14 0601 07/04/14 0530  NA 140  < > 141 142 146* 148* 139  K 3.1*  < > 4.1 3.8 3.9 3.2* 3.1*  CL 99  < > 105 101 107 112 98  CO2 33*  < > 32 34* 32 30 35*  GLUCOSE 133*  < > 168* 125* 106* 103* 127*  BUN 22  < > 20 22 25* 20 20  CREATININE 0.38*  < > 0.40* 0.35* 0.39* 0.40* 0.54  CALCIUM 8.1*  < >  8.1* 8.4 8.6 8.4 8.4  MG 1.9  --   --  2.3  --   --   --   PHOS 3.0  --   --  3.7  --   --   --   < > = values in this interval not displayed. Estimated Creatinine Clearance: 61 mL/min (by C-G formula based on Cr of 0.54).  LIVER  Recent Labs Lab 07/01/14 0515 07/04/14 0530  AST 22 19  ALT 67* 51*  ALKPHOS 85 91  BILITOT 0.7 0.7  PROT 6.3 6.3  ALBUMIN 2.8* 2.9*   INFECTIOUS No results for input(s): LATICACIDVEN, PROCALCITON in the last 168 hours. ENDOCRINE CBG (last 3)   Recent Labs  07/03/14 2024 07/04/14 07/04/14 0350  GLUCAP 120* 120* 118*    IMAGING x48h Dg Chest Port 1 View  07/04/2014   CLINICAL DATA:  Respiratory failure  EXAM: PORTABLE CHEST - 1 VIEW  COMPARISON:  Portable chest x-ray of 07/03/2014  FINDINGS: The apparent prior interstitial edema pattern has improved considerably. Atelectasis and  possible small effusions remain at the lung bases. Mild cardiomegaly is stable. The left IJ central venous line tip overlies the lower SVC near the expected RA junction.  IMPRESSION: Improvement and interstitial edema. Probable small effusions and mild basilar atelectasis.   Electronically Signed   By: Ivar Drape M.D.   On: 07/04/2014 07:13   Dg Chest Port 1 View  07/03/2014   CLINICAL DATA:  History of bilateral pneumonia and right hilar and mediastinal reactive lymphadenopathy. Intubated. Now on BiPAP. Acute respiratory failure with worsening in the past 2 days.  EXAM: PORTABLE CHEST - 1 VIEW  COMPARISON:  07/02/2014  FINDINGS: Left IJ central line tip overlies the level of the superior vena cava. Heart size is normal. There is increased opacity at the left lung base. Patchy opacity persists at the right lung base. Persistent interstitial edema. Small bilateral pleural effusions are suspected. Surgical clips are identified in the left axillary region.  Dense opacity overlies the left midchest, likely artifactual.  IMPRESSION: 1. Increased opacity in the left lower lobe consistent with increasing atelectasis or infiltrate. 2. Persistent interstitial edema.   Electronically Signed   By: Nolon Nations M.D.   On: 07/03/2014 10:18  aeration worse  ASSESSMENT / PLAN:  PULMONARY OETT 3/2 (size 7, easy airway with glidescope) > 3/5 A: Acute Hypoxic Respiratory failure - in setting of AECOPD + Flu A + CAP; some pulmonary edema  2/28 Tobacco Abuse - smoker, no baseline PFT's Bilateral R>L pleural effusions -->right transudate by light's criteria  Off bipap overnight. Work of breathing improved. CXR improved remarkably  P:   Cont BIPAP PRN Repeat ABG Taper steroids  Continue budesonide and brovana Aim for even to slightly neg volume status Follow CXR intermittently Continue nicotine patch Flutter valve Swallow eval pending Set up f/u with [place] [facility] on discharge for PFT's and management of  presumed COPD.  CARDIOVASCULAR CVL L IJ 2/24 >>> A:  Septic Shock resolved  P:  Allow neg NET goal as BP will allow  Accept SBP in 90s to facilitate volume goals F/u echo  RENAL A:   Hypokalemia  Hypernatremia - resolved P:   KVO fluids Monitor BMET and UOP Repeat & Replace electrolytes as needed  GASTROINTESTINAL A:   GERD Nutrition Hx diverticulosis, internal hemorrhoids P:   Stress ulcer prophylaxis - Pantoprazole Bowel regimen - senna SLEP eval.   HEMATOLOGIC A:   VTE Prophylaxis P:  SCD's /  Lovenox CBC in AM  INFECTIOUS BCx2 2/24 >>neg  UCx 2/24 >>neg  Sputum 2/24 >>neg  RVP 2/24 >> INFLUENZA A U. Strep 2/25 >> neg U. Legionella 2/25 >>neg Right pleural fluid 3/1>>> no organisms  A:   CAP  P:   Abx: Ceftriaxone, start date 2/24, completed 7d Abx: Azithromycin, start date 2/24, completed 7d Antiviral Tamiflu 2/25 >> 2/29 Vancomycin 3/7 >>> Zosyn 3/7>>> Trend fever and WBC curve   ENDOCRINE A:   No known issues  P:   SSI if glucose consistently > 180.  NEUROLOGIC A:   Acute Metabolic Encephalopathy - in setting of hypercapnia  Hx Fibromyalgia, cluster headaches, back pain > looks a little better  P:   Haldol prn for agitation  PT/OT - advance activity as tolerated   Family updated: daily by Kary Kos   NP summary  Improved work of breathing, did not require NIPPV or anxiolytics overnight. Less edema on CXR. Productive cough, purulent per report. Afebrile and WBC stable. Continue HCAP coverage as patient is improved. Will use diuresis w/ goal even to slightly negative volume status. Will obtain PT/OT consult to cautiously increase activity to minimize back pain & opioid use. She looks better today.   Erick Colace ACNP-BC [place] Pager # [phone] OR # [phone] if no answer    Attending:  I have seen and examined the patient with nurse practitioner/resident and agree with the note above.   Jackelyn Poling looks  great today!  She is breathing comfortably, wants to eat.  Plan speech evaluation Maintain in ICU today To chair if able  Roselie Awkward, MD [place] [facility] Pager: [phone] Cell: [phone] If no response, call [phone]

## 2014-07-04 NOTE — Evaluation (Signed)
Clinical/Bedside Swallow Evaluation Patient Details  Name: Elaine Le MRN: [id number] Date of Birth: October 16, 1960  Today's Date: 07/04/2014 Time: SLP Start Time (ACUTE ONLY): 1035 SLP Stop Time (ACUTE ONLY): 1105 SLP Time Calculation (min) (ACUTE ONLY): 30 min  Past Medical History:  Past Medical History  Diagnosis Date  . Chronic sinusitis   . Arthritis   . Fibromyalgia   . GERD (gastroesophageal reflux disease)   . Irritable bowel syndrome (IBS)   . Headaches, cluster   . Back pain   . Fatigue   . Muscle pain   . Night sweats   . Continuous urine leakage   . Diverticulosis   . Adenomatous colon polyp   . Internal hemorrhoids   . Hiatal hernia    Past Surgical History:  Past Surgical History  Procedure Laterality Date  . Thoracic outlet surgery    . Tubal ligation    . Vulva surgery    . Neuroplasty / transposition median nerve at carpal tunnel bilateral    . Cystostomy w/ bladder dilation      at age 70   HPI:  54 year old female admitted 06/21/14 due to SOB, cough, congestion. Pt was intubated 2/24-06/28/14, then 3/2-07/01/14 due to AECOPD and CAP. Pt with multisystem organ failure, difficult intubation. Once extubated, pt continued to have respiratory distress and anxiety, and required bipap and ativan on and off until 07/03/14.  SLP able to evaluate pt at bedside 07/04/14.   Assessment / Plan / Recommendation Clinical Impression  Pt completed oral care after setup. Oral motor strength and function appear adequate. Pt exhibits immediate cough response to thin liquids, indicating likely airway compromise, and multiple swallows following puree raising suspicion for poor pharyngeal contraction. Voice quality is hoarse, decreasing airway protection. Given extended intubation (the first of which was documented as "difficult"), and bedside presentation, recommend MBS prior to initiating pos.  Discussed with NP, who feels pt is appropriate to proceed today. Will schedule with  radiology.    Aspiration Risk  Moderate    Diet Recommendation NPO (NPO until MBS completed)   Medication Administration: Via alternative means    Other  Recommendations Recommended Consults: MBS Oral Care Recommendations: Oral care Q4 per protocol   Follow Up Recommendations   (TBD)    Frequency and Duration        Pertinent Vitals/Pain Back pain reported, eased following repositioning.    SLP Swallow Goals  po tolerance   Swallow Study Prior Functional Status   No history of dysphagia prior to admit.    General Date of Onset: 06/21/14 HPI: 54 year old female admitted 06/21/14 due to SOB, cough, congestion. Pt was intubated 2/24-06/28/14, then 3/2-07/01/14 due to AECOPD and CAP. Pt with multisystem organ failure, difficult intubation. Once extubated, pt continued to have respiratory distress and anxiety, and required bipap and ativan on and off until 07/03/14.  SLP able to evaluate pt at bedside 07/04/14. Type of Study: Bedside swallow evaluation Previous Swallow Assessment: no prior SLP intervention found Diet Prior to this Study: NPO Temperature Spikes Noted: No Respiratory Status: Nasal cannula History of Recent Intubation: Yes Length of Intubations (days): 11 days Date extubated: 07/01/14 Behavior/Cognition: Alert;Cooperative;Pleasant mood Oral Cavity - Dentition: Adequate natural dentition Self-Feeding Abilities: Able to feed self Patient Positioning: Upright in bed Baseline Vocal Quality: Hoarse;Low vocal intensity Volitional Cough: Strong Volitional Swallow: Able to elicit    Oral/Motor/Sensory Function Overall Oral Motor/Sensory Function: Appears within functional limits for tasks assessed   [employer]  chips: Impaired Pharyngeal Phase Impairments: Suspected delayed Swallow;Decreased hyoid-laryngeal movement;Cough - Delayed   Thin Liquid Thin Liquid: Impaired Pharyngeal  Phase Impairments: Suspected delayed Swallow;Decreased hyoid-laryngeal movement;Cough -  Immediate    Nectar Thick Nectar Thick Liquid: Not tested   Honey Thick Honey Thick Liquid: Not tested   Puree Puree: Impaired Presentation: Spoon Pharyngeal Phase Impairments: Suspected delayed Swallow;Decreased hyoid-laryngeal movement;Multiple swallows   Solid   GO    Solid: Not tested      Celia B. Quentin Ore [facility], [place] [phone]  Shonna Chock 07/04/2014,11:18 AM

## 2014-07-04 NOTE — Progress Notes (Signed)
Pt currently resting comfortably on 2 LPM  and tolerating well at this time.  Pt is in no distress and has no increase WOB.  BIPAP not needed at this time, RT to monitor and assess throughout the night.

## 2014-07-04 NOTE — Progress Notes (Signed)
  Echocardiogram 2D Echocardiogram has been performed.  Elaine Le 07/04/2014, 9:56 AM

## 2014-07-04 NOTE — Progress Notes (Signed)
July 04, 2014/Teri Legacy L. Rosana Hoes, RN, BSN, CCM. Case Management [place] [phone] No discharge needs present of time of review.

## 2014-07-04 NOTE — Progress Notes (Signed)
SLP Cancellation Note  Patient Details Name: Elaine Le MRN: [id number] DOB: 01/15/1961   Cancelled treatment:        Per RN, pt appropriate for BSE today. Chart reviewed and history taken. Pt currently unavailable due to in-room procedure. Will continue efforts.  Celia B. Quentin Ore [facility], CCC-SLP [phone]  Shonna Chock 07/04/2014, 9:17 AM

## 2014-07-04 NOTE — Progress Notes (Signed)
Pt coughs when given ice.  Pt's father keeps giving her ice and melted ice water after- which she has congested cough. I asked him to please restrain from giving her ice until swallow evaluation is completed and we can be sure it is not going into her lungs. He then asked if I would bring her more ice. I reassured him and told him that Speech therapy would be here soon to evaluate her swallowing. Family also asked to not drink in room as we have patient in droplet precautions and need to be wearing a mask. They stated that " different people have different rules."  Droplet precaution education provided. Trinetta Alemu, Beverly Gust, RN

## 2014-07-04 NOTE — Progress Notes (Signed)
PT Cancellation Note  Patient Details Name: Elaine Le MRN: [id number] DOB: 06-08-1960   Cancelled Treatment:    Reason Eval/Treat Not Completed: Patient not medically ready (K+ 3.1, BP 92/53) will attempt again tomorrow or as schedule permits.   [facility] 07/04/2014, 8:13 AM

## 2014-07-04 NOTE — Procedures (Signed)
Objective Swallowing Evaluation: Modified Barium Swallowing Study  Patient Details  Name: Elaine Le MRN: [id number] Date of Birth: [birth year]  Today's Date: 07/04/2014 Time: SLP Start Time (ACUTE ONLY): 1530-SLP Stop Time (ACUTE ONLY): 1550 SLP Time Calculation (min) (ACUTE ONLY): 20 min  Past Medical History:  Past Medical History  Diagnosis Date  . Chronic sinusitis   . Arthritis   . Fibromyalgia   . GERD (gastroesophageal reflux disease)   . Irritable bowel syndrome (IBS)   . Headaches, cluster   . Back pain   . Fatigue   . Muscle pain   . Night sweats   . Continuous urine leakage   . Diverticulosis   . Adenomatous colon polyp   . Internal hemorrhoids   . Hiatal hernia    Past Surgical History:  Past Surgical History  Procedure Laterality Date  . Thoracic outlet surgery    . Tubal ligation    . Vulva surgery    . Neuroplasty / transposition median nerve at carpal tunnel bilateral    . Cystostomy w/ bladder dilation      at age 1   HPI:  HPI: 54 year old female admitted 06/21/14 due to SOB, cough, congestion. Pt was intubated 2/24-06/28/14, then 3/2-07/01/14 due to AECOPD and CAP. Pt with multisystem organ failure, difficult intubation. Once extubated, pt continued to have respiratory distress and anxiety, and required bipap and ativan on and off until 07/03/14.  SLP able to evaluate pt at bedside 07/04/14, with recommendation for NPO status until MBS completed.   No Data Recorded  Assessment / Plan / Recommendation CHL IP CLINICAL IMPRESSIONS 07/04/2014  Dysphagia Diagnosis (None)  Clinical impression Mild-moderate pharyngeal dysphagia, characterized by delayed swallow reflex, due to decreased sensation for primary trigger site. Trigger was noted to occur at the level of the vallecular sinus across consistencies. Vallecular residue was evident across consistencies, moreso with liquid and puree, due to poor epiglottic deflection. Solid consistency caused better epiglottic  inversion, but was still inconsistent, likely due to swelling. Trace pyriform residue was noted intermittently due to decreased pharyngeal contraction. Multiple dry swallows were effective to reduce vallecular abd pyriform residue. Intermittent flash penetration was noted on nectar thick liquid via teaspoon, but no penetration was seen on nectar thick liquid via cup sip. Frank aspiration of thin liquid during the swallow was noted, with immediate cough response. Chin tuck position increased the amount of aspirate, and does not affect valecular residue, so CHIN TUCK position IS NOT RECOMMENDED for this pt. Recommend beginning with dys 2 diet for energy conservation, nectar thick liquids via cup sip. Ice chips are allowed 1 at a time by RN or SLP following thorough oral care. Safe swallow precautions posted at [facility] and reviewed at length with pt and her sister. These include taking rest breaks, minimize distractions, small bites/sips at slow rate, multiple swallows per bite/sip, smaller more frequent meals. ST to follow for diet tolerance and readiness to liberalize diet.        CHL IP TREATMENT RECOMMENDATION 07/04/2014  Treatment Plan Recommendations Therapy as outlined in treatment plan below     CHL IP DIET RECOMMENDATION 07/04/2014  Diet Recommendations Dysphagia 2 (Fine chop);Nectar-thick liquid;Ice chips PRN after oral care  Liquid Administration via Cup;No straw  Medication Administration (No Data)  Compensations Small sips/bites;Slow rate;Multiple dry swallows after each bite/sip;Follow solids with liquid  Postural Changes and/or Swallow Maneuvers Seated upright 90 degrees;Upright 30-60 min after meal     CHL IP OTHER RECOMMENDATIONS 07/04/2014  Recommended Consults (None)  Oral Care Recommendations Oral care Q4 per protocol  Other Recommendations Remove water pitcher     CHL IP FOLLOW UP RECOMMENDATIONS 07/04/2014  Follow up Recommendations (No Data)     CHL IP FREQUENCY AND DURATION 07/04/2014   Speech Therapy Frequency (ACUTE ONLY) min 2x/week  Treatment Duration 2 weeks     Pertinent Vitals/Pain No pain    SLP Swallow Goals No flowsheet data found. Diet tolerance No flowsheet data found.    CHL IP REASON FOR REFERRAL 07/04/2014  Reason for Referral Objectively evaluate swallowing function     CHL IP ORAL PHASE 07/04/2014  Lips (None)  Tongue (None)  Mucous membranes (None)  Nutritional status (None)  Other (None)  Oxygen therapy (None)  Oral Phase WFL  Oral - Pudding Teaspoon (None)  Oral - Pudding Cup (None)  Oral - Honey Teaspoon (None)  Oral - Honey Cup (None)  Oral - Honey Syringe (None)  Oral - Nectar Teaspoon (None)  Oral - Nectar Cup (None)  Oral - Nectar Straw (None)  Oral - Nectar Syringe (None)  Oral - Ice Chips (None)  Oral - Thin Teaspoon (None)  Oral - Thin Cup (None)  Oral - Thin Straw (None)  Oral - Thin Syringe (None)  Oral - Puree (None)  Oral - Mechanical Soft (None)  Oral - Regular (None)  Oral - Multi-consistency (None)  Oral - Pill (None)  Oral Phase - Comment (None)      CHL IP PHARYNGEAL PHASE 07/04/2014  Pharyngeal Phase Impaired  Pharyngeal - Pudding Teaspoon (None)  Penetration/Aspiration details (pudding teaspoon) (None)  Pharyngeal - Pudding Cup (None)  Penetration/Aspiration details (pudding cup) (None)  Pharyngeal - Honey Teaspoon (None)  Penetration/Aspiration details (honey teaspoon) (None)  Pharyngeal - Honey Cup (None)  Penetration/Aspiration details (honey cup) (None)  Pharyngeal - Honey Syringe (None)  Penetration/Aspiration details (honey syringe) (None)  Pharyngeal - Nectar Teaspoon Delayed swallow initiation;Premature spillage to valleculae;Reduced epiglottic inversion;Reduced airway/laryngeal closure;Reduced tongue base retraction;Pharyngeal residue - valleculae;Pharyngeal residue - pyriform sinuses;Reduced anterior laryngeal mobility;Reduced laryngeal elevation;Penetration/Aspiration after swallow   Penetration/Aspiration details (nectar teaspoon) Material enters airway, remains ABOVE vocal cords then ejected out  Pharyngeal - Nectar Cup Delayed swallow initiation;Premature spillage to valleculae;Reduced epiglottic inversion;Reduced tongue base retraction;Pharyngeal residue - valleculae;Pharyngeal residue - pyriform sinuses;Reduced anterior laryngeal mobility;Reduced laryngeal elevation  Penetration/Aspiration details (nectar cup) (None)  Pharyngeal - Nectar Straw (None)  Penetration/Aspiration details (nectar straw) (None)  Pharyngeal - Nectar Syringe (None)  Penetration/Aspiration details (nectar syringe) (None)  Pharyngeal - Ice Chips (None)  Penetration/Aspiration details (ice chips) (None)  Pharyngeal - Thin Teaspoon (None)  Penetration/Aspiration details (thin teaspoon) (None)  Pharyngeal - Thin Cup Delayed swallow initiation;Premature spillage to valleculae;Reduced epiglottic inversion;Reduced tongue base retraction;Pharyngeal residue - valleculae;Pharyngeal residue - pyriform sinuses;Reduced anterior laryngeal mobility;Reduced laryngeal elevation;Penetration/Aspiration during swallow;Compensatory strategies attempted (Comment)  Penetration/Aspiration details (thin cup) Material enters airway, passes BELOW cords and not ejected out despite cough attempt by patient  Pharyngeal - Thin Straw (None)  Penetration/Aspiration details (thin straw) (None)  Pharyngeal - Thin Syringe (None)  Penetration/Aspiration details (thin syringe') (None)  Pharyngeal - Puree Delayed swallow initiation;Premature spillage to valleculae;Reduced epiglottic inversion;Reduced tongue base retraction;Pharyngeal residue - valleculae;Reduced anterior laryngeal mobility;Reduced laryngeal elevation  Penetration/Aspiration details (puree) (None)  Pharyngeal - Mechanical Soft (None)  Penetration/Aspiration details (mechanical soft) (None)  Pharyngeal - Regular (None)  Penetration/Aspiration details (regular) (None)   Pharyngeal - Multi-consistency Delayed swallow initiation;Premature spillage to valleculae;Reduced epiglottic inversion;Reduced tongue base retraction;Pharyngeal residue - valleculae;Reduced anterior  laryngeal mobility;Reduced laryngeal elevation  Penetration/Aspiration details (multi-consistency) (None)  Pharyngeal - Pill (None)  Penetration/Aspiration details (pill) (None)  Pharyngeal Comment inconsistent epiglottic inversion with solids.     CHL IP CERVICAL ESOPHAGEAL PHASE 07/04/2014  Cervical Esophageal Phase WFL  Pudding Teaspoon (None)  Pudding Cup (None)  Honey Teaspoon (None)  Honey Cup (None)  Honey Syringe (None)  Nectar Teaspoon (None)  Nectar Cup (None)  Nectar Straw (None)  Nectar Syringe (None)  Thin Teaspoon (None)  Thin Cup (None)  Thin Straw (None)  Thin Syringe (None)  Cervical Esophageal Comment (None)    No flowsheet data found.  Michale Emmerich B. Quentin Ore [facility], CCC-SLP [phone]       Shonna Chock 07/04/2014, 4:48 PM

## 2014-07-05 DIAGNOSIS — R29898 Other symptoms and signs involving the musculoskeletal system: Secondary | ICD-10-CM

## 2014-07-05 DIAGNOSIS — I330 Acute and subacute infective endocarditis: Secondary | ICD-10-CM

## 2014-07-05 HISTORY — DX: Other symptoms and signs involving the musculoskeletal system: R29.898

## 2014-07-05 LAB — GLUCOSE, CAPILLARY
GLUCOSE-CAPILLARY: 123 mg/dL — AB (ref 70–99)
GLUCOSE-CAPILLARY: 129 mg/dL — AB (ref 70–99)
Glucose-Capillary: 116 mg/dL — ABNORMAL HIGH (ref 70–99)
Glucose-Capillary: 147 mg/dL — ABNORMAL HIGH (ref 70–99)
Glucose-Capillary: 133 mg/dL — ABNORMAL HIGH (ref 70–99)

## 2014-07-05 LAB — BASIC METABOLIC PANEL
ANION GAP: 10 (ref 5–15)
BUN: 15 mg/dL (ref 6–23)
CO2: 32 mmol/L (ref 19–32)
Calcium: 8.3 mg/dL — ABNORMAL LOW (ref 8.4–10.5)
Chloride: 93 mmol/L — ABNORMAL LOW (ref 96–112)
Creatinine, Ser: 0.48 mg/dL — ABNORMAL LOW (ref 0.50–1.10)
GFR calc Af Amer: >90 mL/min (ref >90)
GFR calc non Af Amer: >90 mL/min (ref >90)
Glucose, Bld: 115 mg/dL — ABNORMAL HIGH (ref 70–99)
POTASSIUM: 3.3 mmol/L — AB (ref 3.5–5.1)
Sodium: 135 mmol/L (ref 135–145)

## 2014-07-05 LAB — MAGNESIUM: Magnesium: 2.3 mg/dL (ref 1.5–2.5)

## 2014-07-05 MED ORDER — PREDNISONE 5 MG PO TABS
30.0000 mg | ORAL_TABLET | Freq: Every day | ORAL | Status: DC
  Administered 2014-07-06 – 2014-07-07 (×2): 30 mg via ORAL
  Filled 2014-07-05 (×2): qty 1
  Filled 2014-07-05: qty 2
  Filled 2014-07-05: qty 1

## 2014-07-05 MED ORDER — NICOTINE 14 MG/24HR TD PT24
14.0000 mg | MEDICATED_PATCH | Freq: Every day | TRANSDERMAL | Status: DC
  Administered 2014-07-05 – 2014-07-06 (×2): 14 mg via TRANSDERMAL
  Filled 2014-07-05 (×2): qty 1

## 2014-07-05 MED ORDER — PANTOPRAZOLE SODIUM 40 MG PO TBEC
40.0000 mg | DELAYED_RELEASE_TABLET | Freq: Every day | ORAL | Status: DC
  Administered 2014-07-05: 40 mg via ORAL
  Filled 2014-07-05: qty 1

## 2014-07-05 MED ORDER — FUROSEMIDE 40 MG PO TABS
40.0000 mg | ORAL_TABLET | Freq: Once | ORAL | Status: AC
  Administered 2014-07-05: 40 mg via ORAL
  Filled 2014-07-05: qty 1

## 2014-07-05 MED ORDER — POTASSIUM CHLORIDE CRYS ER 20 MEQ PO TBCR
40.0000 meq | EXTENDED_RELEASE_TABLET | Freq: Once | ORAL | Status: AC
  Administered 2014-07-05: 40 meq via ORAL
  Filled 2014-07-05: qty 2

## 2014-07-05 NOTE — Progress Notes (Signed)
PULMONARY / CRITICAL CARE MEDICINE   Name: Elaine Le MRN: [id number] DOB: [birth year]    ADMISSION DATE:  06/21/2014 CONSULTATION DATE:  07/05/2014  REFERRING MD :  EDP  CHIEF COMPLAINT:  SOB  INITIAL PRESENTATION:  54 y.o. F, smoker,  admitted 2/24 with a 3 day hx of cough and SOB.  On arrival was found to have significant respiratory distress and hypoxia. Patient failed bipap and was intubated with a #6.5 ETT in ER, difficult intubation.   STUDIES:  CTA chest 2/24 >>> no PE, underlying emphysema.  Bilateral PNA, right hilar and mediastinal reactive lymphadenopathy. Echo 3/8 >>> Normal systolic function. EF 60-65%. Anterior mitral valve leaflet with large shaggy mobile density on the LV side of the leaflet appreciated only in apical 4 and 2 chamber views. Unclear if patient has underlying rheumatic mitral stenosis and now possible endocarditis. This needs to be evaluated further with TEE. Moderate diffuse thickening of the anterior leaflet consistent with moderate stenosis.   SIGNIFICANT EVENTS: 2/24  Admit, intubated 2/25  Levo @ 10 mcg, sedate on vent.  Family concerned she has an 'allergy to albuterol'.   2/26  Weaned to 4 mcg levo, awake on vent, no acute events overnight, aspiration? 2/27  weaning levophed 3/1    right thoracentesis -->transudate  3/2   Extubated. Reintubated for increased work of breathing. Levophed restarted overnight.  3/4 extubated 3/7 on BIPAP on and off since extubation. Work of breathing worse.  3/8 remarkably better  SUBJECTIVE:   Extubated, improved work of breathing. Denies pain.   VITAL SIGNS: Temp:  [98.3 F (36.8 C)-99.1 F (37.3 C)] 98.3 F (36.8 C) (03/09 0400) Resp:  [15-27] 18 (03/09 0600) BP: (89-131)/(48-78) 102/57 mmHg (03/09 0600) SpO2:  [94 %-100 %] 99 % (03/09 0600) Weight:  [41.7 kg (91 lb 14.9 oz)] 41.7 kg (91 lb 14.9 oz) (03/09 0500)     3L [place]   INTAKE / OUTPUT: Intake/Output      03/08 0701 - 03/09 0700 03/09 0701 -  03/10 0700   P.O. 120    I.V. (mL/kg) 1040 (24.9)    IV Piggyback 650    Total Intake(mL/kg) 1810 (43.4)    Urine (mL/kg/hr) 1495 (1.5)    Stool 0 (0)    Total Output 1495     Net +315          Stool Occurrence 1 x      PHYSICAL EXAMINATION:  Gen: thin woman, sleeping  HEENT: NCAT. Mucous membranes moist.  PULM: Scattered rhonchi.   CV: RRR, no MGR  Ab: BS+, soft, nontender Ext: notable edema in bilateral hands  Neuro: awake, follows commands, equal strength bilaterally  LABS:  PULMONARY  Recent Labs Lab 06/28/14 1242 06/29/14 0548 06/30/14 0402 07/03/14 1057  PHART 7.434 7.499* 7.532* 7.507*  PCO2ART 51.3* 41.7 41.3 37.7  PO2ART 68.4* 107.0* 62.4* 58.4*  HCO3 33.8* 32.1* 34.6* 29.7*  TCO2 30.1 28.9 30.1 25.8  O2SAT 92.2 97.9 91.9 90.7   CBC  Recent Labs Lab 06/30/14 0445 07/01/14 0515 07/04/14 0530  HGB 12.2 11.8* 11.8*  HCT 38.2 36.6 37.0  WBC 12.8* 13.3* 12.1*  PLT 332 366 445*   CARDIAC No results for input(s): TROPONINI in the last 168 hours. No results for input(s): PROBNP in the last 168 hours.  CHEMISTRY  Recent Labs Lab 06/29/14 0530  07/01/14 0515 07/02/14 0536 07/03/14 0601 07/04/14 0530 07/05/14 0513  NA 140  < > 142 146* 148* 139 135  K 3.1*  < >  3.8 3.9 3.2* 3.1* 3.3*  CL 99  < > 101 107 112 98 93*  CO2 33*  < > 34* 32 30 35* 32  GLUCOSE 133*  < > 125* 106* 103* 127* 115*  BUN 22  < > 22 25* 20 20 15   CREATININE 0.38*  < > 0.35* 0.39* 0.40* 0.54 0.48*  CALCIUM 8.1*  < > 8.4 8.6 8.4 8.4 8.3*  MG 1.9  --  2.3  --   --   --  2.3  PHOS 3.0  --  3.7  --   --   --   --   < > = values in this interval not displayed. Estimated Creatinine Clearance: 52.9 mL/min (by C-G formula based on Cr of 0.48).  LIVER  Recent Labs Lab 07/01/14 0515 07/04/14 0530  AST 22 19  ALT 67* 51*  ALKPHOS 85 91  BILITOT 0.7 0.7  PROT 6.3 6.3  ALBUMIN 2.8* 2.9*   INFECTIOUS No results for input(s): LATICACIDVEN, PROCALCITON in the last 168  hours. ENDOCRINE CBG (last 3)   Recent Labs  07/04/14 2325 07/05/14 0340 07/05/14 0823  GLUCAP 109* 116* 123*    IMAGING x48h Dg Chest Port 1 View  07/04/2014   CLINICAL DATA:  Respiratory failure  EXAM: PORTABLE CHEST - 1 VIEW  COMPARISON:  Portable chest x-ray of 07/03/2014  FINDINGS: The apparent prior interstitial edema pattern has improved considerably. Atelectasis and possible small effusions remain at the lung bases. Mild cardiomegaly is stable. The left IJ central venous line tip overlies the lower SVC near the expected RA junction.  IMPRESSION: Improvement and interstitial edema. Probable small effusions and mild basilar atelectasis.   Electronically Signed   By: Ivar Drape M.D.   On: 07/04/2014 07:13   Dg Chest Port 1 View  07/03/2014   CLINICAL DATA:  History of bilateral pneumonia and right hilar and mediastinal reactive lymphadenopathy. Intubated. Now on BiPAP. Acute respiratory failure with worsening in the past 2 days.  EXAM: PORTABLE CHEST - 1 VIEW  COMPARISON:  07/02/2014  FINDINGS: Left IJ central line tip overlies the level of the superior vena cava. Heart size is normal. There is increased opacity at the left lung base. Patchy opacity persists at the right lung base. Persistent interstitial edema. Small bilateral pleural effusions are suspected. Surgical clips are identified in the left axillary region.  Dense opacity overlies the left midchest, likely artifactual.  IMPRESSION: 1. Increased opacity in the left lower lobe consistent with increasing atelectasis or infiltrate. 2. Persistent interstitial edema.   Electronically Signed   By: Nolon Nations M.D.   On: 07/03/2014 10:18   Dg Swallowing Func-speech Pathology  07/04/2014   Lorre Nick, CCC-SLP     07/04/2014  4:49 PM  Objective Swallowing Evaluation: Modified Barium Swallowing Study   Patient Details  Name: Elaine Le MRN: [id number] Date of Birth: February 06, 1961  Today's Date: 07/04/2014 Time: SLP Start Time (ACUTE  ONLY): 1530-SLP Stop Time (ACUTE  ONLY): 1550 SLP Time Calculation (min) (ACUTE ONLY): 20 min  Past Medical History:  Past Medical History  Diagnosis Date  . Chronic sinusitis   . Arthritis   . Fibromyalgia   . GERD (gastroesophageal reflux disease)   . Irritable bowel syndrome (IBS)   . Headaches, cluster   . Back pain   . Fatigue   . Muscle pain   . Night sweats   . Continuous urine leakage   . Diverticulosis   . Adenomatous colon polyp   .  Internal hemorrhoids   . Hiatal hernia    Past Surgical History:  Past Surgical History  Procedure Laterality Date  . Thoracic outlet surgery    . Tubal ligation    . Vulva surgery    . Neuroplasty / transposition median nerve at carpal tunnel  bilateral    . Cystostomy w/ bladder dilation      at age 89   HPI:  HPI: 54 year old female admitted 06/21/14 due to SOB, cough,  congestion. Pt was intubated 2/24-06/28/14, then 3/2-07/01/14 due to  AECOPD and CAP. Pt with multisystem organ failure, difficult  intubation. Once extubated, pt continued to have respiratory  distress and anxiety, and required bipap and ativan on and off  until 07/03/14.  SLP able to evaluate pt at bedside 07/04/14, with  recommendation for NPO status until MBS completed.   No Data Recorded  Assessment / Plan / Recommendation CHL IP CLINICAL IMPRESSIONS 07/04/2014  Dysphagia Diagnosis (None)  Clinical impression Mild-moderate pharyngeal dysphagia,  characterized by delayed swallow reflex, due to decreased  sensation for primary trigger site. Trigger was noted to occur at  the level of the vallecular sinus across consistencies.  Vallecular residue was evident across consistencies, moreso with  liquid and puree, due to poor epiglottic deflection. Solid  consistency caused better epiglottic inversion, but was still  inconsistent, likely due to swelling. Trace pyriform residue was  noted intermittently due to decreased pharyngeal contraction.  Multiple dry swallows were effective to reduce vallecular abd  pyriform residue.  Intermittent flash penetration was noted on  nectar thick liquid via teaspoon, but no penetration was seen on  nectar thick liquid via cup sip. Frank aspiration of thin liquid  during the swallow was noted, with immediate cough response. Chin  tuck position increased the amount of aspirate, and does not  affect valecular residue, so CHIN TUCK position IS NOT  RECOMMENDED for this pt. Recommend beginning with dys 2 diet for  energy conservation, nectar thick liquids via cup sip. Ice chips  are allowed 1 at a time by RN or SLP following thorough oral  care. Safe swallow precautions posted at [facility] and reviewed at  length with pt and her sister. These include taking rest breaks,  minimize distractions, small bites/sips at slow rate, multiple  swallows per bite/sip, smaller more frequent meals. ST to follow  for diet tolerance and readiness to liberalize diet.        CHL IP TREATMENT RECOMMENDATION 07/04/2014  Treatment Plan Recommendations Therapy as outlined in treatment  plan below     CHL IP DIET RECOMMENDATION 07/04/2014  Diet Recommendations Dysphagia 2 (Fine chop);Nectar-thick  liquid;Ice chips PRN after oral care  Liquid Administration via Cup;No straw  Medication Administration (No Data)  Compensations Small sips/bites;Slow rate;Multiple dry swallows  after each bite/sip;Follow solids with liquid  Postural Changes and/or Swallow Maneuvers Seated upright 90  degrees;Upright 30-60 min after meal     CHL IP OTHER RECOMMENDATIONS 07/04/2014  Recommended Consults (None)  Oral Care Recommendations Oral care Q4 per protocol  Other Recommendations Remove water pitcher     CHL IP FOLLOW UP RECOMMENDATIONS 07/04/2014  Follow up Recommendations (No Data)     CHL IP FREQUENCY AND DURATION 07/04/2014  Speech Therapy Frequency (ACUTE ONLY) min 2x/week  Treatment Duration 2 weeks     Pertinent Vitals/Pain No pain    SLP Swallow Goals No flowsheet data found. Diet tolerance No flowsheet data found.    CHL IP REASON FOR REFERRAL 07/04/2014  Reason for Referral Objectively evaluate swallowing function     CHL IP ORAL PHASE 07/04/2014  Lips (None)  Tongue (None)  Mucous membranes (None)  Nutritional status (None)  Other (None)  Oxygen therapy (None)  Oral Phase WFL  Oral - Pudding Teaspoon (None)  Oral - Pudding Cup (None)  Oral - Honey Teaspoon (None)  Oral - Honey Cup (None)  Oral - Honey Syringe (None)  Oral - Nectar Teaspoon (None)  Oral - Nectar Cup (None)  Oral - Nectar Straw (None)  Oral - Nectar Syringe (None)  Oral - Ice Chips (None)  Oral - Thin Teaspoon (None)  Oral - Thin Cup (None)  Oral - Thin Straw (None)  Oral - Thin Syringe (None)  Oral - Puree (None)  Oral - Mechanical Soft (None)  Oral - Regular (None)  Oral - Multi-consistency (None)  Oral - Pill (None)  Oral Phase - Comment (None)      CHL IP PHARYNGEAL PHASE 07/04/2014  Pharyngeal Phase Impaired  Pharyngeal - Pudding Teaspoon (None)  Penetration/Aspiration details (pudding teaspoon) (None)  Pharyngeal - Pudding Cup (None)  Penetration/Aspiration details (pudding cup) (None)  Pharyngeal - Honey Teaspoon (None)  Penetration/Aspiration details (honey teaspoon) (None)  Pharyngeal - Honey Cup (None)  Penetration/Aspiration details (honey cup) (None)  Pharyngeal - Honey Syringe (None)  Penetration/Aspiration details (honey syringe) (None)  Pharyngeal - Nectar Teaspoon Delayed swallow initiation;Premature  spillage to valleculae;Reduced epiglottic inversion;Reduced  airway/laryngeal closure;Reduced tongue base  retraction;Pharyngeal residue - valleculae;Pharyngeal residue -  pyriform sinuses;Reduced anterior laryngeal mobility;Reduced  laryngeal elevation;Penetration/Aspiration after swallow  Penetration/Aspiration details (nectar teaspoon) Material enters  airway, remains ABOVE vocal cords then ejected out  Pharyngeal - Nectar Cup Delayed swallow initiation;Premature  spillage to valleculae;Reduced epiglottic inversion;Reduced  tongue base retraction;Pharyngeal residue - valleculae;Pharyngeal   residue - pyriform sinuses;Reduced anterior laryngeal  mobility;Reduced laryngeal elevation  Penetration/Aspiration details (nectar cup) (None)  Pharyngeal - Nectar Straw (None)  Penetration/Aspiration details (nectar straw) (None)  Pharyngeal - Nectar Syringe (None)  Penetration/Aspiration details (nectar syringe) (None)  Pharyngeal - Ice Chips (None)  Penetration/Aspiration details (ice chips) (None)  Pharyngeal - Thin Teaspoon (None)  Penetration/Aspiration details (thin teaspoon) (None)  Pharyngeal - Thin Cup Delayed swallow initiation;Premature  spillage to valleculae;Reduced epiglottic inversion;Reduced  tongue base retraction;Pharyngeal residue - valleculae;Pharyngeal  residue - pyriform sinuses;Reduced anterior laryngeal  mobility;Reduced laryngeal elevation;Penetration/Aspiration  during swallow;Compensatory strategies attempted (Comment)  Penetration/Aspiration details (thin cup) Material enters airway,  passes BELOW cords and not ejected out despite cough attempt by  patient  Pharyngeal - Thin Straw (None)  Penetration/Aspiration details (thin straw) (None)  Pharyngeal - Thin Syringe (None)  Penetration/Aspiration details (thin syringe') (None)  Pharyngeal - Puree Delayed swallow initiation;Premature spillage  to valleculae;Reduced epiglottic inversion;Reduced tongue base  retraction;Pharyngeal residue - valleculae;Reduced anterior  laryngeal mobility;Reduced laryngeal elevation  Penetration/Aspiration details (puree) (None)  Pharyngeal - Mechanical Soft (None)  Penetration/Aspiration details (mechanical soft) (None)  Pharyngeal - Regular (None)  Penetration/Aspiration details (regular) (None)  Pharyngeal - Multi-consistency Delayed swallow  initiation;Premature spillage to valleculae;Reduced epiglottic  inversion;Reduced tongue base retraction;Pharyngeal residue -  valleculae;Reduced anterior laryngeal mobility;Reduced laryngeal  elevation  Penetration/Aspiration details (multi-consistency) (None)   Pharyngeal - Pill (None)  Penetration/Aspiration details (pill) (None)  Pharyngeal Comment inconsistent epiglottic inversion with solids.      CHL IP CERVICAL ESOPHAGEAL PHASE 07/04/2014  Cervical Esophageal Phase WFL  Pudding Teaspoon (None)  Pudding Cup (None)  Honey Teaspoon (None)  Honey Cup (None)  Honey Syringe (None)  Nectar Teaspoon (None)  Nectar Cup (None)  Nectar Straw (None)  Nectar Syringe (None)  Thin Teaspoon (None)  Thin Cup (None)  Thin Straw (None)  Thin Syringe (None)  Cervical Esophageal Comment (None)    No flowsheet data found.  Celia B. Quentin Ore [facility], CCC-SLP [phone]       Shonna Chock 07/04/2014, 4:48 PM   aeration worse  ASSESSMENT / PLAN:  PULMONARY OETT 3/2 (size 7, easy airway with glidescope) > 3/5 A: Acute Hypoxic Respiratory failure - in setting of AECOPD + Flu A + CAP; some pulmonary edema  2/28 Tobacco Abuse - smoker, no baseline PFT's Bilateral R>L pleural effusions -->right transudate by light's criteria  Off bipap overnight. Work of breathing improved. CXR improved remarkably  P:   Taper steroids  Continue budesonide and brovana Aim for even to slightly neg volume status Follow CXR intermittently Continue nicotine patch  Flutter valve Set up f/u with [facility] on discharge for PFT's and management of presumed COPD.  CARDIOVASCULAR CVL L IJ 2/24 >>> A:  Septic Shock resolved  Shaggy mobile density on MV (LV side) new finding on ECHO 3/8. ? endocarditis P:  Allow neg NET goal as BP will allow  Accept SBP in 90s to facilitate volume goals TEE to further evaluate possible mitral vegetation prob Monday Cards eval-->? Anticoagulation ???  RENAL A:   Hypokalemia  Hypernatremia - resolved P:   KVO fluids Monitor BMET and UOP Repeat & Replace electrolytes as needed  GASTROINTESTINAL A:   GERD Nutrition Hx diverticulosis, internal hemorrhoids P:   Stress ulcer prophylaxis - Pantoprazole Bowel regimen - senna Continue diet  per SLEP recommendation.   HEMATOLOGIC A:   VTE Prophylaxis P:  SCD's / Lovenox CBC in AM  INFECTIOUS BCx2 2/24 >>neg  UCx 2/24 >>neg  Sputum 2/24 >>neg  RVP 2/24 >> INFLUENZA A U. Strep 2/25 >> neg U. Legionella 2/25 >>neg Right pleural fluid 3/1>>> no organisms  A:   CAP  P:   Abx: Ceftriaxone, start date 2/24, completed 7d Abx: Azithromycin, start date 2/24, completed 7d Antiviral Tamiflu 2/25 >> 2/29 Vancomycin 3/7 >>> Zosyn 3/7>>> Trend fever and WBC curve  Blood cultures x 2 today given possible mitral valve vegetation.   ENDOCRINE A:   No known issues  P:   SSI if glucose consistently > 180.  NEUROLOGIC A:   Acute Metabolic Encephalopathy - in setting of hypercapnia  Hx Fibromyalgia, cluster headaches, back pain Reports difficulty sleeping  P:   Decrease nicotine patch to enhance sleep.  PT/OT - advance activity as tolerated   Family updated: daily by Kary Kos   NP summary  Improved work of breathing, did not require NIPPV or anxiolytics overnight. Afebrile and WBC stable. Continue HCAP coverage as patient is improved. Will need TEE and blood cultures to further evaluate possible mitral valve vegetation. Will use diuresis w/ goal even to slightly negative volume status. Increase activity.   Attending:  I have seen and examined the patient with nurse practitioner/resident and agree with the note above.   Lungs with crackles and rhonchi No clear murmur but exam difficult due to lung sounds  Severe CAP, influenza > improving Emphysema Acute encephalopathy> resolved Deconditioning > PT eval, consider CIR Mitral valve veg> unclear why she has this, will check blood cultures again, does not appear to have an active bacteremia based on exam, vitals, etc  Father updated bedside Remain on PCCM service  Roselie Awkward, MD [facility] Pager: [phone] Cell: [phone] If no response,  call [phone]

## 2014-07-05 NOTE — Progress Notes (Signed)
NUTRITION FOLLOW UP Pt meets criteria for severe malnutrition in the context of chronic illness as evidenced by >5% weight loss in 1 month and severe subcutaneous body fat and muscle mass depletion.  Intervention:   - Provide Magic Cup BID, each provides 290 kcal, 9 grams of protein  - Provide Jello with meals - Diet advancement per MD - RD will continue to monitor  Nutrition Dx:   Inadequate oral intake related to inability to eat as evidenced by DYS 3 diet.  Goal:   Pt to meet >/= 90% of estimated needs; ongoing  Monitor:   Diet advancement, TF  tolerance, weight trends, labs  Assessment:   Pt admitted with SOB and cough x 3 days. Pt is a smoker, diagnosed with COPD. PMH significant for GERD, fibromyalgia, IBS, headaches, internal hemorroids and hiatal hernia.   - Pt extubated then re-intubated 3/2.  - Pt extubated 3/4 on BiPAP on and off - TF d/c 3/8 - Pt placed on DYS 3 diet 3/9 - Pt on continued bowel regimen- Senna - Labs- high CO2, low K  Explained DYS 3 diet to patient and sister, who did not understand what she could have to eat. Pt reported slowly regaining appetite, but her current PO intake is 50%. Discussed the importance of increasing PO intake as tolerated with pt. Pt reported not enjoying the thickened liquids, and does not like Ensure or supplemental beverages. She was interested in trying [employer] and jello as snacks. Recalculated energy needs based on her usual body wt (105 lb) and current status.  Nutrition Focused Physical Exam:  Subcutaneous Fat:  Orbital Region: severe Upper Arm Region: severe Thoracic and Lumbar Region: severe  Muscle:  Temple Region: severe Clavicle Bone Region: severe Clavicle and Acromion Bone Region: severe Scapular Bone Region: severe Dorsal Hand: severe Patellar Region: severe Anterior Thigh Region: severe Posterior Calf Region: severe  Edema: not present  Height: Ht Readings from Last 1 Encounters:  06/30/14 5'  4" (1.626 m)    Weight Status:   Wt Readings from Last 1 Encounters:  07/05/14 91 lb 14.9 oz (41.7 kg)   Re-estimated needs:  Kcal: 1350-1500 kcals Protein: 80-90 g protein Fluid: Per MD  Skin: intact  Diet Order: DIET DYS 3   Intake/Output Summary (Last 24 hours) at 07/05/14 1030 Last data filed at 07/05/14 0800  Gross per 24 hour  Intake   1790 ml  Output   1425 ml  Net    365 ml    Last BM: 3/8   Labs:   Recent Labs Lab 06/29/14 0530  07/01/14 0515  07/03/14 0601 07/04/14 0530 07/05/14 0513  NA 140  < > 142  < > 148* 139 135  K 3.1*  < > 3.8  < > 3.2* 3.1* 3.3*  CL 99  < > 101  < > 112 98 93*  CO2 33*  < > 34*  < > 30 35* 32  BUN 22  < > 22  < > 20 20 15   CREATININE 0.38*  < > 0.35*  < > 0.40* 0.54 0.48*  CALCIUM 8.1*  < > 8.4  < > 8.4 8.4 8.3*  MG 1.9  --  2.3  --   --   --  2.3  PHOS 3.0  --  3.7  --   --   --   --   GLUCOSE 133*  < > 125*  < > 103* 127* 115*  < > = values in this  interval not displayed.  CBG (last 3)   Recent Labs  07/04/14 2325 07/05/14 0340 07/05/14 0823  GLUCAP 109* 116* 123*    Scheduled Meds: . antiseptic oral rinse  7 mL Mouth Rinse QID  . arformoterol  15 mcg Nebulization Q12H  . budesonide (PULMICORT) nebulizer solution  0.25 mg Nebulization Q12H  . enoxaparin (LOVENOX) injection  40 mg Subcutaneous Q24H  . furosemide  40 mg Oral Once  . insulin aspart  0-15 Units Subcutaneous 6 times per day  . nicotine  14 mg Transdermal Daily  . pantoprazole  40 mg Oral Daily  . piperacillin-tazobactam (ZOSYN)  IV  3.375 g Intravenous Q8H  . potassium chloride  40 mEq Oral Once  . [START ON 07/06/2014] predniSONE  30 mg Oral Q breakfast  . vancomycin  1,000 mg Intravenous Q24H    Continuous Infusions: . sodium chloride 10 mL/hr (07/04/14 1339)  . dextrose 40 mL/hr at 07/04/14 1159    [name] Dove, MS Dietetic Intern Pager: [phone]

## 2014-07-05 NOTE — Consult Note (Signed)
CARDIOLOGY CONSULT NOTE   Patient ID: Elaine Le MRN: [id number], DOB/AGE: Aug 09, 1960   Admit date: 06/21/2014 Date of Consult: 07/05/2014   Primary Physician: [name], MD Primary Cardiologist: None  Pt. Profile  54 year old woman admitted on 06/21/14 with severe respiratory insufficiency.  2-D echocardiogram yesterday shows questionable shaggy vegetation on the anterior leaflet of mitral valve.  Problem List  Past Medical History  Diagnosis Date  . Chronic sinusitis   . Arthritis   . Fibromyalgia   . GERD (gastroesophageal reflux disease)   . Irritable bowel syndrome (IBS)   . Headaches, cluster   . Back pain   . Fatigue   . Muscle pain   . Night sweats   . Continuous urine leakage   . Diverticulosis   . Adenomatous colon polyp   . Internal hemorrhoids   . Hiatal hernia     Past Surgical History  Procedure Laterality Date  . Thoracic outlet surgery    . Tubal ligation    . Vulva surgery    . Neuroplasty / transposition median nerve at carpal tunnel bilateral    . Cystostomy w/ bladder dilation      at age 57     Allergies  Allergies  Allergen Reactions  . Albuterol   . Morphine And Related     Altered mental state    HPI   This 54 year old woman was admitted with severe respiratory insufficiency.  She required intubation and ventilation.  She does not have any prior history of known heart problems.  Years ago she was told by a doctor that she had a heart murmur but then was told by another doctor that she did not have a heart murmur.  She denies any history of rheumatic fever.  She does not have any history of angina pectoris or ischemic heart disease.  EKG on admission showed probable left atrial enlargement.  She is in normal sinus rhythm.  She is a former smoker.  Inpatient Medications  . antiseptic oral rinse  7 mL Mouth Rinse QID  . arformoterol  15 mcg Nebulization Q12H  . budesonide (PULMICORT) nebulizer solution  0.25 mg Nebulization  Q12H  . enoxaparin (LOVENOX) injection  40 mg Subcutaneous Q24H  . insulin aspart  0-15 Units Subcutaneous 6 times per day  . nicotine  14 mg Transdermal Daily  . pantoprazole  40 mg Oral Daily  . piperacillin-tazobactam (ZOSYN)  IV  3.375 g Intravenous Q8H  . [START ON 07/06/2014] predniSONE  30 mg Oral Q breakfast  . vancomycin  1,000 mg Intravenous Q24H    Family History Family History  Problem Relation Age of Onset  . Kidney cancer Mother   . Colon polyps Mother   . Irritable bowel syndrome Mother   . Diabetes Sister   . Liver disease Sister   . Irritable bowel syndrome Daughter   . Diabetes Brother   . Colon cancer Neg Hx      Social History History   Social History  . Marital Status: Single    Spouse Name: N/A  . Number of Children: 1  . Years of Education: N/A   Occupational History  . Not on file.   Social History Main Topics  . Smoking status: Current Every Day Smoker -- 0.50 packs/day    Types: Cigarettes  . Smokeless tobacco: Never Used  . Alcohol Use: Yes     Comment: rare  . Drug Use: No  . Sexual Activity: Not on file  Other Topics Concern  . Not on file   Social History Narrative     Review of Systems  General:  No chills, fever, night sweats or weight changes.  Cardiovascular:  No chest pain, dyspnea on exertion, edema, orthopnea, palpitations, paroxysmal nocturnal dyspnea. Dermatological: No rash, lesions/masses Respiratory: No cough, dyspnea Urologic: No hematuria, dysuria Abdominal:   No nausea, vomiting, diarrhea, bright red blood per rectum, melena, or hematemesis Neurologic:  No visual changes, wkns, changes in mental status. All other systems reviewed and are otherwise negative except as noted above.  Physical Exam  Blood pressure 113/52, pulse 63, temperature 98.3 F (36.8 C), temperature source Oral, resp. rate 17, height 5\' 4"  (1.626 m), weight 91 lb 14.9 oz (41.7 kg), SpO2 99 %.  General: Pleasant, NAD Psych: Normal  affect. Neuro: Alert and oriented X 3. Moves all extremities spontaneously. HEENT: Normal.  She has a left IJ line in place  Neck: Supple without bruits or JVD. Lungs: Coarse inspiratory and expiratory rhonchi throughout all lung fields Heart: RRR no s3, s4, or murmurs.  The first heart sound is not accentuated.  I do not hear an opening snap or a mitral rumble Abdomen: Soft, non-tender, non-distended, BS + x 4.  Extremities: No clubbing, cyanosis or edema. DP/PT/Radials 2+ and equal bilaterally.  Labs  No results for input(s): CKTOTAL, CKMB, TROPONINI in the last 72 hours. Lab Results  Component Value Date   WBC 12.1* 07/04/2014   HGB 11.8* 07/04/2014   HCT 37.0 07/04/2014   MCV 94.4 07/04/2014   PLT 445* 07/04/2014    Recent Labs Lab 07/04/14 0530 07/05/14 0513  NA 139 135  K 3.1* 3.3*  CL 98 93*  CO2 35* 32  BUN 20 15  CREATININE 0.54 0.48*  CALCIUM 8.4 8.3*  PROT 6.3  --   BILITOT 0.7  --   ALKPHOS 91  --   ALT 51*  --   AST 19  --   GLUCOSE 127* 115*   Lab Results  Component Value Date   TRIG 115 06/28/2014   Lab Results  Component Value Date   DDIMER 0.75* 06/21/2014    Radiology/Studies  Dg Neck Soft Tissue  06/21/2014   CLINICAL DATA:  Neck swelling, increased since central line placement.  EXAM: NECK SOFT TISSUES - 1+ VIEW  COMPARISON:  None.  FINDINGS: NG and endotracheal tubes are in place. Left central venous catheter. Suggestion of increased density in the soft tissues of the left neck. This is nonspecific in could be due to patient positioning, soft tissue attenuation, or hematoma. Clinical correlation recommended.  IMPRESSION: Increased density in the left neck of nonspecific etiology. Soft tissue hematoma versus soft tissue attenuation due to positioning.   Electronically Signed   By: Lucienne Capers M.D.   On: 06/21/2014 21:49   Dg Chest 2 View  06/21/2014   CLINICAL DATA:  Two day history of cough and difficulty breathing. Fever  EXAM: CHEST  2  VIEW  COMPARISON:  None.  FINDINGS: There is a nipple shadow on the left. The interstitium is mildly prominent in the lower lung zones, slightly greater on the right than on the left, probably reflecting chronic inflammatory type change. There is no frank edema or consolidation. Heart size and pulmonary vascularity are normal. No adenopathy. No bone lesions. There are surgical clips in the left axillary region.  IMPRESSION: Mild interstitial prominence in the lower lobes, probably reflecting chronic inflammatory type change. No frank edema or consolidation.  Electronically Signed   By: Lowella Grip III M.D.   On: 06/21/2014 15:58   Dg Abd 1 View  06/23/2014   CLINICAL DATA:  OG tube placement.  EXAM: ABDOMEN - 1 VIEW  COMPARISON:  06/23/2014  FINDINGS: Enteric tube tip and side hole projected over the left upper quadrant consistent with location in the mid stomach. Visualized bowel gas pattern is normal. Diffuse coarse fibrosis in the lungs.  IMPRESSION: Enteric tube tip localizes over the mid stomach.   Electronically Signed   By: Lucienne Capers M.D.   On: 06/23/2014 22:53   Ct Angio Chest Pe W/cm &/or Wo Cm  06/21/2014   CLINICAL DATA:  54 year old female with shortness of breath and cough since Monday. Neck and back pain. Initial encounter. Hypoxia  EXAM: CT ANGIOGRAPHY CHEST WITH CONTRAST  TECHNIQUE: Multidetector CT imaging of the chest was performed using the standard protocol during bolus administration of intravenous contrast. Multiplanar CT image reconstructions and MIPs were obtained to evaluate the vascular anatomy.  CONTRAST:  125mL OMNIPAQUE IOHEXOL 350 MG/ML SOLN  COMPARISON:  Chest radiographs 1540 hours today. CT Abdomen and Pelvis 07/23/2011.  FINDINGS: Good contrast bolus timing in the pulmonary arterial tree. Respiratory motion artifact in the lower lobes. No focal filling defect identified in the pulmonary arterial tree to suggest the presence of acute pulmonary embolism.  Compared  to the lung bases on 07/23/2011, there is diffuse pulmonary septal thickening. There is also vague and nodular peribronchovascular in the lungs bilaterally, slightly greater on the right and maximal in the lower lobe. There is superimposed central lobular upper lobe predominant emphysema. Small right side Bochdalek hernia.  No pericardial or pleural effusion. Mildly enlarged hilar lymph nodes on the right. Indistinct increased mediastinal soft tissue about the carina measuring up to 10 mm short axis. Aberrant right subclavian artery origin. Increased pretracheal soft tissue measuring up to 9 mm short axis at the level of the great vessels. Soft and calcified plaque in the descending thoracic aorta and continuing into the abdominal aorta.  Negative visualized liver, spleen, adrenal glands, kidneys, and bowel in the upper abdomen. No axillary lymphadenopathy.  No acute osseous abnormality identified.  Review of the MIP images confirms the above findings.  IMPRESSION: 1.  No evidence of acute pulmonary embolus. 2. Abnormal lungs with a combination of septal thickening and nodular peribronchovascular opacity. Underlying emphysema. No pleural fluid. Favor bilateral pneumonia over acute pulmonary edema. 3. Increased right hilar and mediastinal soft tissue favored to be reactive lymphadenopathy. 4. Aortic atherosclerosis.  Aberrant right subclavian artery.   Electronically Signed   By: Genevie Ann M.D.   On: 06/21/2014 17:23   Dg Chest Port 1 View  07/04/2014   CLINICAL DATA:  Respiratory failure  EXAM: PORTABLE CHEST - 1 VIEW  COMPARISON:  Portable chest x-ray of 07/03/2014  FINDINGS: The apparent prior interstitial edema pattern has improved considerably. Atelectasis and possible small effusions remain at the lung bases. Mild cardiomegaly is stable. The left IJ central venous line tip overlies the lower SVC near the expected RA junction.  IMPRESSION: Improvement and interstitial edema. Probable small effusions and mild  basilar atelectasis.   Electronically Signed   By: Ivar Drape M.D.   On: 07/04/2014 07:13   Dg Chest Port 1 View  07/03/2014   CLINICAL DATA:  History of bilateral pneumonia and right hilar and mediastinal reactive lymphadenopathy. Intubated. Now on BiPAP. Acute respiratory failure with worsening in the past 2 days.  EXAM: PORTABLE CHEST -  1 VIEW  COMPARISON:  07/02/2014  FINDINGS: Left IJ central line tip overlies the level of the superior vena cava. Heart size is normal. There is increased opacity at the left lung base. Patchy opacity persists at the right lung base. Persistent interstitial edema. Small bilateral pleural effusions are suspected. Surgical clips are identified in the left axillary region.  Dense opacity overlies the left midchest, likely artifactual.  IMPRESSION: 1. Increased opacity in the left lower lobe consistent with increasing atelectasis or infiltrate. 2. Persistent interstitial edema.   Electronically Signed   By: Nolon Nations M.D.   On: 07/03/2014 10:18   Dg Chest Port 1 View  07/02/2014   CLINICAL DATA:  Acute respiratory failure. Community acquired pneumonia. Pleural effusion.  EXAM: PORTABLE CHEST - 1 VIEW  COMPARISON:  06/30/2014  FINDINGS: Endotracheal tube and nasogastric tube been removed. Left jugular central venous catheter remains in appropriate position. No pneumothorax visualized.  Diffuse interstitial infiltrates show no significant change, consistent with diffuse interstitial edema. Small bilateral pleural effusions are also stable. Decreased pulmonary opacity noted in the left retrocardiac lung base. Heart size is within normal limits.  IMPRESSION: Improving pulmonary opacity in left retrocardiac lung base.  Diffuse interstitial edema pattern and small bilateral pleural effusions, without significant change.   Electronically Signed   By: Earle Gell M.D.   On: 07/02/2014 07:17   Dg Chest Port 1 View  06/30/2014   CLINICAL DATA:  Respiratory failure, intubated  patient.  EXAM: PORTABLE CHEST - 1 VIEW  COMPARISON:  Portable chest x-ray of June 29, 2014  FINDINGS: The lungs are well-expanded. The interstitial markings are diffusely increased but have improved slightly since yesterday's exam. Confluent densities in the left mid lung and right infrahilar early region are less conspicuous as well. The cardiac silhouette is normal in size. The pulmonary vascularity is more distinct and less engorged today. The endotracheal tube tip projects 5.1 cm above the crotch of the carina. The esophagogastric tube tip projects below the inferior margin of the image. The left internal jugular venous catheter tip projects over the midportion of the SVC.  IMPRESSION: Improved appearance of the pulmonary interstitium suggesting improving interstitial edema/pneumonia. Alveolar opacities bilaterally are less conspicuous as well.   Electronically Signed   By: David  Martinique   On: 06/30/2014 07:18   Dg Chest Port 1 View  06/29/2014   CLINICAL DATA:  Evaluate endotracheal tube position.  EXAM: PORTABLE CHEST - 1 VIEW  COMPARISON:  06/28/2014.  FINDINGS: Support apparatus: Endotracheal tube tip 32 mm from the carina. Enteric tube present and esophagus with the tip not visualized. LEFT IJ central line with the tip in the mid SVC. Monitoring leads project over the chest.  Cardiomediastinal Silhouette: Within normal limits for size, unchanged.  Lungs: Allowing for technical differences, no interval change and interstitial and alveolar pulmonary edema. This is greater on the RIGHT than LEFT. Kerley B-lines are present in the RIGHT upper lobe. No pneumothorax.  Effusions:  Small bilateral pleural effusions.  Other:  Surgical clips over the LEFT axilla  IMPRESSION: 1. Support apparatus unchanged and in good position. 2. Little if any interval change and diffuse interstitial and airspace opacity, most compatible with pulmonary edema.   Electronically Signed   By: Dereck Ligas M.D.   On: 06/29/2014  07:15   Portable Chest Xray  06/28/2014   CLINICAL DATA:  Acute respiratory failure, check endotracheal tube placement  EXAM: PORTABLE CHEST - 1 VIEW  COMPARISON:  06/27/2014  FINDINGS:  Cardiac shadow is stable. A left jugular central line and endotracheal tube are noted in satisfactory position. Diffuse bilateral infiltrative changes are noted. Given some technical variations in the film the overall appearance is stable. No new focal abnormality is seen. A left pleural effusion is again noted.  IMPRESSION: Diffuse bilateral infiltrative changes stable from the prior study.   Electronically Signed   By: Inez Catalina M.D.   On: 06/28/2014 12:34   Dg Chest Port 1 View  06/27/2014   CLINICAL DATA:  Post right thoracentesis  EXAM: PORTABLE CHEST - 1 VIEW  COMPARISON:  06/27/2014  FINDINGS: Cardiomediastinal silhouette is stable. Stable endotracheal and NG tube position. Stable left IJ central line position. Persistent interstitial prominence bilaterally highly suspicious for interstitial edema or pneumonitis. Improvement in aeration in right base with significant decrease in right pleural effusion. No pneumothorax. Trace left basilar atelectasis.  IMPRESSION: Stable support apparatus. Again noted interstitial prominence bilaterally suspicious for interstitial edema or pneumonitis. Improvement in aeration right base with significant decrease in right pleural effusion. No pneumothorax.   Electronically Signed   By: Lahoma Crocker M.D.   On: 06/27/2014 11:44   Dg Chest Port 1 View  06/27/2014   CLINICAL DATA:  Pneumonia  EXAM: PORTABLE CHEST - 1 VIEW  COMPARISON:  Portable chest x-ray of June 26, 2014  FINDINGS: The lungs are well-expanded. The interstitial markings remain diffusely increased. The hemidiaphragms are obscured. The cardiac silhouette is normal in size. The pulmonary vascularity is indistinct. The endotracheal tube tip lies 3 cm above the carina. The esophagogastric tube tip projects below the inferior  margin of the image. The left internal jugular venous catheter tip projects over the midportion of the SVC.  IMPRESSION: Further deterioration in the appearance of the pulmonary interstitium since yesterday's study. Pneumonia, pulmonary edema, and ARDS remain the leading entities in the differential diagnosis. The support tubes and lines are in appropriate position radiographically.   Electronically Signed   By: David  Martin[place]   On: 06/27/2014 07:22   Dg Chest Port 1 View  06/26/2014   CLINICAL DATA:  Acute respiratory failure with hypoxia  EXAM: PORTABLE CHEST - 1 VIEW  COMPARISON:  June 25, 2014  FINDINGS: Endotracheal tube tip is 4.5 cm above the carina. Nasogastric tube tip and side port are below the diaphragm. Central catheter tip is in the superior cava. No pneumothorax. There is diffuse interstitial edema with patchy alveolar opacity throughout both lungs. There is a small right pleural effusion. The appearance is stable. Heart size and pulmonary vascularity are normal. No adenopathy.  IMPRESSION: Tube and catheter positions as described without pneumothorax. Interstitial and patchy alveolar opacity diffusely. Suspect multifocal pneumonia. Atypical congestive heart failure and/or ARDS superimposed also are considerations. More than one of these entities may well exist concurrently. There is a small right effusion.   Electronically Signed   By: Lowella Grip III M.D.   On: 06/26/2014 07:24   Dg Chest Port 1 View  06/25/2014   CLINICAL DATA:  Portable chest x-ray for acute respiratory failure.  EXAM: PORTABLE CHEST - 1 VIEW  COMPARISON:  06/24/2014  FINDINGS: The trachea tube, NG to, and right tissues line are unchanged. Stable cardiac silhouette. There is bilateral patchy airspace disease with some and consolidation in the lower lobes. No pneumothorax.  IMPRESSION: 1. Stable support apparatus. 2. Bilateral diffuse and patchy airspace disease is concerning for multifocal pneumonia. Some increased  consolidation in the lower lobes.   Electronically Signed   By:  Suzy Bouchard M.D.   On: 06/25/2014 08:59   Dg Chest Port 1 View  06/24/2014   CLINICAL DATA:  Acute respiratory failure  EXAM: PORTABLE CHEST - 1 VIEW  COMPARISON:  06/23/2014  FINDINGS: An endotracheal tube, nasogastric catheter and left-sided jugular central line are again seen and stable in appearance apparent cardiac shadow is within normal limits. Increasing bilateral infiltrates are identified in the perihilar regions when compare with the prior exam. The previously seen pattern of congestive failure with interstitial edema is again noted. No sizable effusion is seen.  IMPRESSION: CHF with superimposed perihilar infiltrates bilaterally.  Tubes and lines stable from the prior exam.   Electronically Signed   By: Inez Catalina M.D.   On: 06/24/2014 06:47   Dg Chest Port 1 View  06/23/2014   CLINICAL DATA:  Hypoxia  EXAM: PORTABLE CHEST - 1 VIEW  COMPARISON:  June 22, 2014  FINDINGS: Endotracheal tube tip is 5.3 cm above the carina. Central catheter tip is in the superior vena cava. Nasogastric tube tip and side port are in the stomach. No pneumothorax. There is underlying emphysema. There is generalized interstitial edema with mild alveolar edema in the right base. No new opacity. Heart size is upper normal with pulmonary vascularity within normal limits.  IMPRESSION: Tube and catheter positions as described without pneumothorax. The appearance is consistent with a degree of post congestive heart failure superimposed on emphysematous change. Suspect mild alveolar edema in the right base, although superimposed pneumonia in the right base cannot be excluded radiographically.   Electronically Signed   By: Lowella Grip III M.D.   On: 06/23/2014 07:10   Dg Chest Port 1 View  06/22/2014   CLINICAL DATA:  Intubated patient, acute respiratory failure.  EXAM: PORTABLE CHEST - 1 VIEW  COMPARISON:  Portable chest x-ray of June 21, 2014   FINDINGS: The endotracheal tube tip lies 5.2 cm above the crotch of the carina. The esophagogastric tube tip projects below the GE junction. The left internal jugular venous catheter tip projects over the proximal portion of the SVC. The cardiac silhouette is normal in size. The pulmonary vascularity is not engorged. The interstitial markings of both lungs are increased there is increasing density in the right infrahilar region. There is a small correction there small bilateral pleural effusions.  IMPRESSION: Persistent pulmonary interstitial edema superimposed upon COPD. Right lower lobe atelectasis or pneumonia atelectasis or pneumonia. There are small bilateral pleural effusions. The support tubes and lines are in appropriate position radiographically.   Electronically Signed   By: David  Martin[place]   On: 06/22/2014 07:29   Dg Chest Port 1 View  06/21/2014   CLINICAL DATA:  Postcentral line placement.  EXAM: PORTABLE CHEST - 1 VIEW  COMPARISON:  06/21/2014  FINDINGS: Endotracheal tube tip measures 5.2 cm above the carinal. Enteric tube tip is off the field of view. Left central venous catheter has been placed since previous study. Tip overlies the low SVC. No pneumothorax. Normal heart size. Increased pulmonary vascularity with perihilar and interstitial infiltrates likely representing edema. Lung bases are not included on the field of view. Surgical clips in the left chest.  IMPRESSION: Appliances appear in satisfactory location. Persistent pulmonary vascular congestion and bilateral pulmonary infiltrates likely due to edema.   Electronically Signed   By: Lucienne Capers M.D.   On: 06/21/2014 21:44   Dg Chest Portable 1 View  06/21/2014   CLINICAL DATA:  Shortness of breath and respiratory failure. Status post intubation.  EXAM: PORTABLE CHEST - 1 VIEW  COMPARISON:  Chest x-ray at 1540 hours  FINDINGS: Endotracheal tube present with the tip approximately 3.8 cm above the carina. A nasogastric tube has been  placed which extends below the diaphragm lungs show significant worsening of diffuse pulmonary edema since the prior chest x-ray which is slightly worse in the right lung compared to the left. Given asymmetry, there may be a component of right lower lung pneumonia as well. No significant pleural fluid is identified. The heart size is stable and normal.  IMPRESSION: Significant worsening of bilateral pulmonary edema. Endotracheal tube tip is approximately 3.8 cm above the carina. Component of right lower lung pneumonia cannot be excluded.   Electronically Signed   By: Aletta Edouard M.D.   On: 06/21/2014 20:09   Dg Abd Portable 1v  06/28/2014   CLINICAL DATA:  Initial encounter for NG tube placement  EXAM: PORTABLE ABDOMEN - 1 VIEW  COMPARISON:  06/23/2014.  On  FINDINGS: OG tube tip overlies the mid to distal stomach. Bowel gas pattern is nonspecific.  IMPRESSION: Tip of the OG tube overlies the mid to distal stomach.   Electronically Signed   By: Misty Stanley M.D.   On: 06/28/2014 18:19   Dg Abd Portable 1v  06/23/2014   CLINICAL DATA:  Feeding tube placement.  EXAM: PORTABLE ABDOMEN - 1 VIEW  COMPARISON:  July 02, 2010.  FINDINGS: The bowel gas pattern is normal. Distal tip of feeding tube appears to be at the level of midesophagus.  IMPRESSION: Distal tip of feeding tube appears to be at the level of mid esophagus. Advancement is recommended.   Electronically Signed   By: Marijo Conception, M.D.   On: 06/23/2014 16:59   Dg Swallowing Func-speech Pathology  07/04/2014   Lorre Nick, CCC-SLP     07/04/2014  4:49 PM  Objective Swallowing Evaluation: Modified Barium Swallowing Study   Patient Details  Name: Elaine Le MRN: [id number] Date of Birth: 14-Jun-1960  Today's Date: 07/04/2014 Time: SLP Start Time (ACUTE ONLY): 1530-SLP Stop Time (ACUTE  ONLY): 1550 SLP Time Calculation (min) (ACUTE ONLY): 20 min  Past Medical History:  Past Medical History  Diagnosis Date  . Chronic sinusitis   . Arthritis   .  Fibromyalgia   . GERD (gastroesophageal reflux disease)   . Irritable bowel syndrome (IBS)   . Headaches, cluster   . Back pain   . Fatigue   . Muscle pain   . Night sweats   . Continuous urine leakage   . Diverticulosis   . Adenomatous colon polyp   . Internal hemorrhoids   . Hiatal hernia    Past Surgical History:  Past Surgical History  Procedure Laterality Date  . Thoracic outlet surgery    . Tubal ligation    . Vulva surgery    . Neuroplasty / transposition median nerve at carpal tunnel  bilateral    . Cystostomy w/ bladder dilation      at age 59   HPI:  HPI: 54 year old female admitted 06/21/14 due to SOB, cough,  congestion. Pt was intubated 2/24-06/28/14, then 3/2-07/01/14 due to  AECOPD and CAP. Pt with multisystem organ failure, difficult  intubation. Once extubated, pt continued to have respiratory  distress and anxiety, and required bipap and ativan on and off  until 07/03/14.  SLP able to evaluate pt at bedside 07/04/14, with  recommendation for NPO status until MBS completed.   No Data Recorded  Assessment / Plan / Recommendation [facility]L IP CLINICAL IMPRESSIONS 07/04/2014  Dysphagia Diagnosis (None)  Clinical impression Mild-moderate pharyngeal dysphagia,  characterized by delayed swallow reflex, due to decreased  sensation for primary trigger site. Trigger was noted to occur at  the level of the vallecular sinus across consistencies.  Vallecular residue was evident across consistencies, moreso with  liquid and puree, due to poor epiglottic deflection. Solid  consistency caused better epiglottic inversion, but was still  inconsistent, likely due to swelling. Trace pyriform residue was  noted intermittently due to decreased pharyngeal contraction.  Multiple dry swallows were effective to reduce vallecular abd  pyriform residue. Intermittent flash penetration was noted on  nectar thick liquid via teaspoon, but no penetration was seen on  nectar thick liquid via cup sip. Frank aspiration of thin liquid  during the  swallow was noted, with immediate cough response. Chin  tuck position increased the amount of aspirate, and does not  affect valecular residue, so CHIN TUCK position IS NOT  RECOMMENDED for this pt. Recommend beginning with dys 2 diet for  energy conservation, nectar thick liquids via cup sip. Ice chips  are allowed 1 at a time by RN or SLP following thorough oral  care. Safe swallow precautions posted at [facility] and reviewed at  length with pt and her sister. These include taking rest breaks,  minimize distractions, small bites/sips at slow rate, multiple  swallows per bite/sip, smaller more frequent meals. ST to follow  for diet tolerance and readiness to liberalize diet.        [facility]L IP TREATMENT RECOMMENDATION 07/04/2014  Treatment Plan Recommendations Therapy as outlined in treatment  plan below     [facility] IP DIET RECOMMENDATION 07/04/2014  Diet Recommendations Dysphagia 2 (Fine chop);Nectar-thick  liquid;Ice chips PRN after oral care  Liquid Administration via Cup;No straw  Medication Administration (No Data)  Compensations Small sips/bites;Slow rate;Multiple dry swallows  after each bite/sip;Follow solids with liquid  Postural Changes and/or Swallow Maneuvers Seated upright 90  degrees;Upright 30-60 min after meal     CHL IP OTHER RECOMMENDATIONS 07/04/2014  Recommended Consults (None)  Oral Care Recommendations Oral care Q4 per protocol  Other Recommendations Remove water pitcher     [facility] IP FOLLOW UP RECOMMENDATIONS 07/04/2014  Follow up Recommendations (No Data)     [facility]L IP FREQUENCY AND DURATION 07/04/2014  Speech Therapy Frequency (ACUTE ONLY) min 2x/week  Treatment Duration 2 weeks     Pertinent Vitals/Pain No pain    SLP Swallow Goals No flowsheet data found. Diet tolerance No flowsheet data found.    [facility]L IP REASON FOR REFERRAL 07/04/2014  Reason for Referral Objectively evaluate swallowing function     [facility]L IP ORAL PHASE 07/04/2014  Lips (None)  Tongue (None)  Mucous membranes (None)  Nutritional status (None)  Other (None)   Oxygen therapy (None)  Oral Phase WFL  Oral - Pudding Teaspoon (None)  Oral - Pudding Cup (None)  Oral - Honey Teaspoon (None)  Oral - Honey Cup (None)  Oral - Honey Syringe (None)  Oral - Nectar Teaspoon (None)  Oral - Nectar Cup (None)  Oral - Nectar Straw (None)  Oral - Nectar Syringe (None)  Oral - Ice Chips (None)  Oral - Thin Teaspoon (None)  Oral - Thin Cup (None)  Oral - Thin Straw (None)  Oral - Thin Syringe (None)  Oral - Puree (None)  Oral - Mechanical Soft (None)  Oral - Regular (None)  Oral - Multi-consistency (None)  Oral - Pill (  None)  Oral Phase - Comment (None)      CHL IP PHARYNGEAL PHASE 07/04/2014  Pharyngeal Phase Impaired  Pharyngeal - Pudding Teaspoon (None)  Penetration/Aspiration details (pudding teaspoon) (None)  Pharyngeal - Pudding Cup (None)  Penetration/Aspiration details (pudding cup) (None)  Pharyngeal - Honey Teaspoon (None)  Penetration/Aspiration details (honey teaspoon) (None)  Pharyngeal - Honey Cup (None)  Penetration/Aspiration details (honey cup) (None)  Pharyngeal - Honey Syringe (None)  Penetration/Aspiration details (honey syringe) (None)  Pharyngeal - Nectar Teaspoon Delayed swallow initiation;Premature  spillage to valleculae;Reduced epiglottic inversion;Reduced  airway/laryngeal closure;Reduced tongue base  retraction;Pharyngeal residue - valleculae;Pharyngeal residue -  pyriform sinuses;Reduced anterior laryngeal mobility;Reduced  laryngeal elevation;Penetration/Aspiration after swallow  Penetration/Aspiration details (nectar teaspoon) Material enters  airway, remains ABOVE vocal cords then ejected out  Pharyngeal - Nectar Cup Delayed swallow initiation;Premature  spillage to valleculae;Reduced epiglottic inversion;Reduced  tongue base retraction;Pharyngeal residue - valleculae;Pharyngeal  residue - pyriform sinuses;Reduced anterior laryngeal  mobility;Reduced laryngeal elevation  Penetration/Aspiration details (nectar cup) (None)  Pharyngeal - Nectar Straw (None)   Penetration/Aspiration details (nectar straw) (None)  Pharyngeal - Nectar Syringe (None)  Penetration/Aspiration details (nectar syringe) (None)  Pharyngeal - Ice Chips (None)  Penetration/Aspiration details (ice chips) (None)  Pharyngeal - Thin Teaspoon (None)  Penetration/Aspiration details (thin teaspoon) (None)  Pharyngeal - Thin Cup Delayed swallow initiation;Premature  spillage to valleculae;Reduced epiglottic inversion;Reduced  tongue base retraction;Pharyngeal residue - valleculae;Pharyngeal  residue - pyriform sinuses;Reduced anterior laryngeal  mobility;Reduced laryngeal elevation;Penetration/Aspiration  during swallow;Compensatory strategies attempted (Comment)  Penetration/Aspiration details (thin cup) Material enters airway,  passes BELOW cords and not ejected out despite cough attempt by  patient  Pharyngeal - Thin Straw (None)  Penetration/Aspiration details (thin straw) (None)  Pharyngeal - Thin Syringe (None)  Penetration/Aspiration details (thin syringe') (None)  Pharyngeal - Puree Delayed swallow initiation;Premature spillage  to valleculae;Reduced epiglottic inversion;Reduced tongue base  retraction;Pharyngeal residue - valleculae;Reduced anterior  laryngeal mobility;Reduced laryngeal elevation  Penetration/Aspiration details (puree) (None)  Pharyngeal - Mechanical Soft (None)  Penetration/Aspiration details (mechanical soft) (None)  Pharyngeal - Regular (None)  Penetration/Aspiration details (regular) (None)  Pharyngeal - Multi-consistency Delayed swallow  initiation;Premature spillage to valleculae;Reduced epiglottic  inversion;Reduced tongue base retraction;Pharyngeal residue -  valleculae;Reduced anterior laryngeal mobility;Reduced laryngeal  elevation  Penetration/Aspiration details (multi-consistency) (None)  Pharyngeal - Pill (None)  Penetration/Aspiration details (pill) (None)  Pharyngeal Comment inconsistent epiglottic inversion with solids.      CHL IP CERVICAL ESOPHAGEAL PHASE 07/04/2014   Cervical Esophageal Phase WFL  Pudding Teaspoon (None)  Pudding Cup (None)  Honey Teaspoon (None)  Honey Cup (None)  Honey Syringe (None)  Nectar Teaspoon (None)  Nectar Cup (None)  Nectar Straw (None)  Nectar Syringe (None)  Thin Teaspoon (None)  Thin Cup (None)  Thin Straw (None)  Thin Syringe (None)  Cervical Esophageal Comment (None)    No flowsheet data found.  Celia B. Quentin [facility], CCC-SLP [phone]       Shonna Chock 07/04/2014, 4:48 PM    2-D echo: Normal systolic function. EF 60-65%. Anterior mitral valve leaflet with large shaggy mobile density on the LV side of the leaflet appreciated only in apical 4 and 2 chamber views. Unclear if patient has underlying rheumatic mitral stenosis and now possible endocarditis. This needs to be evaluated further with TEE. Moderate diffuse thickening of the anterior leaflet consistent with moderate stenosis.  ECG  EKG on 06/21/14 shows normal sinus rhythm and no ischemic changes.  Probable left atrial enlargement.  Personally reviewed.  Updated EKG  pending.  ASSESSMENT AND PLAN  1.  Recent acute respiratory failure with intubation, extubation, and subsequent reintubation.  Now extubated again.  Noted to be a difficult intubation.  She is now extubated and nursing staff reports remarkable improvement over the past 24 hours 2.  Abnormal echocardiogram with possible vegetation of mitral valve.  Currently on antibiotics.  She does not appear toxic and clinically she is improving rapidly. She will need a TEE but I agree with pulmonary that it may be better to wait a few more days until she is stronger.  Pulmonary has suggested possibly Monday.  Would continue antibiotics.  In terms of the question of anticoagulation, up-to-date indicates that anticoagulation would not be indicated for endocarditis on long-term unless there were coexisting indications for anticoagulation such as atrial fibrillation or mechanical prosthetic valve etc. Plan Will follow  with you to help coordinate timing of TEE   Signed, Darlin Coco, MD  07/05/2014, 7:15 PM

## 2014-07-05 NOTE — Progress Notes (Signed)
Speech Language Pathology Treatment: Dysphagia  Patient Details Name: Elaine Le MRN: [id number] DOB: 22-Sep-1960 Today's Date: 07/05/2014 Time: 9643-8381 SLP Time Calculation (min) (ACUTE ONLY): 41 min  Assessment / Plan / Recommendation Clinical Impression  Patient eager to advance to more solid foods, but understands that she is not ready to advance to thin liquids just yet.  Voice is intermittently dysphonic, and very low vocal intensity is noted, indicated inconsistent vocal cord adduction.  Once voice is stronger, this will likely be a good indication of improved swallow function as well.  Pt and father educated to above, and to advancement to Dysphagia 3 with chopped meats.  Reiterated swallow precautions and compensatory strategies as well.  Pt and family in agreement with plan.   HPI HPI: Elaine Le admitted 06/21/14 due to SOB, cough, congestion. Pt was intubated 2/24-06/28/14, then 3/2-07/01/14 due to AECOPD and CAP. Pt with multisystem organ failure, difficult intubation. Once extubated, pt continued to have respiratory distress and anxiety, and required bipap and ativan on and off until 07/03/14.  SLP able to evaluate pt at bedside 07/04/14, with recommendation for NPO status until MBS completed.    Pertinent Vitals Pain Assessment: 0-10 Faces Pain Scale: Hurts a little bit  SLP Plan  Continue with current plan of care    Recommendations Diet recommendations: Nectar-thick liquid;Dysphagia 3 (mechanical soft) (chopped meats) Liquids provided via: Cup;No straw Medication Administration: Crushed with puree Supervision: Full supervision/cueing for compensatory strategies;Staff to assist with self feeding Compensations: Small sips/bites;Slow rate;Multiple dry swallows after each bite/sip;Follow solids with liquid Postural Changes and/or Swallow Maneuvers: Seated upright 90 degrees;Upright 30-60 min after meal              General recommendations: OT consult;PT consult Oral  Care Recommendations: Oral care Q4 per protocol Follow up Recommendations: Inpatient Rehab Plan: Continue with current plan of care    GO     Quinn Axe T 07/05/2014, 10:14 AM

## 2014-07-05 NOTE — Evaluation (Signed)
Physical Therapy Evaluation Patient Details Name: Elaine Le MRN: [id number] DOB: [birth year] Today's Date: 07/05/2014   History of Present Illness  Admitted 06/21/14 with resp fail and intubated.  Clinical Impression  Pt presenting with functional mobility limitations 2* generalized weakness and ambulatory balance deficits.  Pt should progress to dc home with family assist.    Follow Up Recommendations No PT follow up    Equipment Recommendations  None recommended by PT    Recommendations for Other Services OT consult     Precautions / Restrictions Precautions Precautions: Fall Restrictions Weight Bearing Restrictions: No      Mobility  Bed Mobility Overal bed mobility: Modified Independent                Transfers Overall transfer level: Needs assistance Equipment used: Rolling walker (2 wheeled) Transfers: Sit to/from Stand Sit to Stand: Min assist;+2 physical assistance;+2 safety/equipment         General transfer comment: cues for transition position and and use of UEs to self assist  Ambulation/Gait Ambulation/Gait assistance: Min assist;+2 physical assistance;+2 safety/equipment Ambulation Distance (Feet): 28 Feet Assistive device: Rolling walker (2 wheeled) Gait Pattern/deviations: Step-through pattern;Decreased step length - right;Decreased step length - left;Shuffle;Trunk flexed Gait velocity: decr   General Gait Details: cues for posture, sequence and position from Elaine Le Mobility    Modified Rankin (Stroke Patients Only)       Balance Overall balance assessment: Needs assistance Sitting-balance support: Bilateral upper extremity supported Sitting balance-Elaine Le Scale: Fair     Standing balance support: Bilateral upper extremity supported Standing balance-Elaine Le Scale: Poor                               Pertinent Vitals/Pain Pain Assessment: 0-10 Pain Score: 2  Faces Pain Scale:  Hurts a little bit Pain Location: back Pain Descriptors / Indicators: Aching;Sore Pain Intervention(s): Limited activity within patient's tolerance;Monitored during session    [place] expects to be discharged to:: Private residence Living Arrangements: Alone Available Help at Discharge: Family;Available 24 hours/day Type of Home: House Home Access: Stairs to enter Entrance Stairs-Rails: None Entrance Stairs-Number of Steps: 1 Home Layout: One level Home Equipment: None      Prior Function Level of Independence: Independent         Comments: Pt states "I walked in here"     Hand Dominance   Dominant Hand: Right    Extremity/Trunk Assessment   Upper Extremity Assessment: Generalized weakness           Lower Extremity Assessment: Generalized weakness      Cervical / Trunk Assessment: Normal  Communication   Communication: No difficulties  Cognition Arousal/Alertness: Awake/alert Behavior During Therapy: WFL for tasks assessed/performed Overall Cognitive Status: Within Functional Limits for tasks assessed                      General Comments      Exercises        Assessment/Plan    PT Assessment Patient needs continued PT services  PT Diagnosis Difficulty walking   PT Problem List Decreased strength;Decreased activity tolerance;Decreased mobility;Decreased knowledge of use of DME  PT Treatment Interventions DME instruction;Gait training;Stair training;Functional mobility training;Therapeutic activities;Therapeutic exercise;Patient/family education   PT Goals (Current goals can be found in the Care Plan section) Acute Rehab PT Goals Patient Stated Goal: Regain  strength and resume previous lifestyle  PT Goal Formulation: With patient Time For Goal Achievement: 07/19/14 Potential to Achieve Goals: Good    Frequency Min 3X/week   Barriers to discharge        Co-evaluation               End of Session Equipment  Utilized During Treatment: Gait belt Activity Tolerance: Patient limited by fatigue Patient left: in chair;with call bell/phone within reach;with family/visitor present Nurse Communication: Mobility status         Time: 1050-1117 PT Time Calculation (min) (ACUTE ONLY): 27 min   Charges:   PT Evaluation $Initial PT Evaluation Tier I: 1 Procedure PT Treatments $Gait Training: 8-22 mins   PT G Codes:        [place]ilene Mcphee 2014/08/03, 1:04 PM

## 2014-07-06 DIAGNOSIS — I330 Acute and subacute infective endocarditis: Secondary | ICD-10-CM

## 2014-07-06 DIAGNOSIS — E43 Unspecified severe protein-calorie malnutrition: Secondary | ICD-10-CM | POA: Insufficient documentation

## 2014-07-06 HISTORY — DX: Unspecified severe protein-calorie malnutrition: E43

## 2014-07-06 LAB — BASIC METABOLIC PANEL
ANION GAP: 6 (ref 5–15)
BUN: 11 mg/dL (ref 6–23)
CO2: 32 mmol/L (ref 19–32)
Calcium: 8.5 mg/dL (ref 8.4–10.5)
Chloride: 98 mmol/L (ref 96–112)
Creatinine, Ser: 0.57 mg/dL (ref 0.50–1.10)
GFR calc Af Amer: >90 mL/min (ref >90)
GFR calc non Af Amer: >90 mL/min (ref >90)
Glucose, Bld: 113 mg/dL — ABNORMAL HIGH (ref 70–99)
Potassium: 3.4 mmol/L — ABNORMAL LOW (ref 3.5–5.1)
SODIUM: 136 mmol/L (ref 135–145)

## 2014-07-06 LAB — CBC
HEMATOCRIT: 36 % (ref 36.0–46.0)
HEMOGLOBIN: 11.7 g/dL — AB (ref 12.0–15.0)
MCH: 30 pg (ref 26.0–34.0)
MCHC: 32.5 g/dL (ref 30.0–36.0)
MCV: 92.3 fL (ref 78.0–100.0)
PLATELETS: 409 10*3/uL — AB (ref 150–400)
RBC: 3.9 MIL/uL (ref 3.87–5.11)
RDW: 14.6 % (ref 11.5–15.5)
WBC: 9.1 10*3/uL (ref 4.0–10.5)

## 2014-07-06 LAB — GLUCOSE, CAPILLARY
GLUCOSE-CAPILLARY: 98 mg/dL (ref 70–99)
Glucose-Capillary: 113 mg/dL — ABNORMAL HIGH (ref 70–99)
Glucose-Capillary: 115 mg/dL — ABNORMAL HIGH (ref 70–99)

## 2014-07-06 MED ORDER — FUROSEMIDE 40 MG PO TABS
40.0000 mg | ORAL_TABLET | Freq: Every day | ORAL | Status: DC
  Administered 2014-07-06 – 2014-07-11 (×6): 40 mg via ORAL
  Filled 2014-07-06 (×6): qty 1

## 2014-07-06 MED ORDER — CETYLPYRIDINIUM CHLORIDE 0.05 % MT LIQD
7.0000 mL | Freq: Two times a day (BID) | OROMUCOSAL | Status: DC
  Administered 2014-07-06 – 2014-07-09 (×7): 7 mL via OROMUCOSAL

## 2014-07-06 MED ORDER — NICOTINE 7 MG/24HR TD PT24
7.0000 mg | MEDICATED_PATCH | Freq: Every day | TRANSDERMAL | Status: DC
  Administered 2014-07-07 – 2014-07-09 (×3): 7 mg via TRANSDERMAL
  Filled 2014-07-06 (×7): qty 1

## 2014-07-06 MED ORDER — POTASSIUM CHLORIDE CRYS ER 20 MEQ PO TBCR
40.0000 meq | EXTENDED_RELEASE_TABLET | Freq: Every day | ORAL | Status: DC
  Administered 2014-07-06 – 2014-07-11 (×6): 40 meq via ORAL
  Filled 2014-07-06 (×7): qty 2

## 2014-07-06 MED ORDER — AMOXICILLIN-POT CLAVULANATE 875-125 MG PO TABS
1.0000 | ORAL_TABLET | Freq: Two times a day (BID) | ORAL | Status: DC
  Administered 2014-07-06 – 2014-07-11 (×11): 1 via ORAL
  Filled 2014-07-06 (×11): qty 1

## 2014-07-06 NOTE — Progress Notes (Signed)
PULMONARY / CRITICAL CARE MEDICINE   Name: Elaine Le MRN: [id number] DOB: July 25, 1960    ADMISSION DATE:  06/21/2014 CONSULTATION DATE:  07/06/2014  REFERRING MD :  EDP  CHIEF COMPLAINT:  SOB  INITIAL PRESENTATION:  54 y.o. F, smoker,  admitted 2/24 with a 3 day hx of cough and SOB.  On arrival was found to have significant respiratory distress and hypoxia. Patient failed bipap and was intubated with a #6.5 ETT in ER, difficult intubation.   STUDIES:  CTA chest 2/24 >>> no PE, underlying emphysema.  Bilateral PNA, right hilar and mediastinal reactive lymphadenopathy. Echo 3/8 >>> Normal systolic function. EF 60-65%. Anterior mitral valve leaflet with large shaggy mobile density on the LV side of the leaflet appreciated only in apical 4 and 2 chamber views. Unclear if patient has underlying rheumatic mitral stenosis and now possible endocarditis. This needs to be evaluated further with TEE. Moderate diffuse thickening of the anterior leaflet consistent with moderate stenosis.   Culture data/abx  Abx: Ceftriaxone, start date 2/24, completed 7d Abx: Azithromycin, start date 2/24, completed 7d Antiviral Tamiflu 2/25 >> 2/29 Vancomycin 3/7 >>> Zosyn 3/7>>> Trend fever and WBC curve  Blood cultures x 2 3/10>>>   SIGNIFICANT EVENTS: 2/24  Admit, intubated; left IJ CVL placed  2/25  Levo @ 10 mcg, sedate on vent.  Family concerned she has an 'allergy to albuterol'.   2/26  Weaned to 4 mcg levo, awake on vent, no acute events overnight, aspiration? 2/27  weaning levophed 3/1    right thoracentesis -->transudate  3/2   Extubated. Reintubated for increased work of breathing. Levophed restarted overnight.  3/4 extubated 3/7 on BIPAP on and off since extubation. Work of breathing worse. ABX widened for HCAP 3/8 remarkably better 3/9 asked Cards to see re: ECHO findings. Plan for TEE.  3/10: problems now resolved over ICU stay. Acute encephalopathy; hypernatremia, septic shock;  Bilateral R>L pleural effusions -->right transudate by light's criteria. Transferred to tele. CVL removed. Changed abx to augmentin   SUBJECTIVE:   Feels better.   VITAL SIGNS: Temp:  [98.4 F (36.9 C)-99 F (37.2 C)] 98.5 F (36.9 C) (03/10 0400) Resp:  [11-27] 17 (03/10 0800) BP: (87-124)/(46-74) 93/74 mmHg (03/10 0800) SpO2:  [97 %-100 %] 100 % (03/10 0800) Weight:  [42.1 kg (92 lb 13 oz)] 42.1 kg (92 lb 13 oz) (03/10 0500)     3L [place]   INTAKE / OUTPUT: Intake/Output      03/09 0701 - 03/10 0700 03/10 0701 - 03/11 0700   P.O. 420    I.V. (mL/kg) 1110 (26.4)    IV Piggyback 312.5    Total Intake(mL/kg) 1842.5 (43.8)    Urine (mL/kg/hr) 1770 (1.8)    Stool 0 (0)    Total Output 1770     Net +72.5          Stool Occurrence 1 x      PHYSICAL EXAMINATION:  Gen: thin woman, awake, weak after critical illness  HEENT: NCAT. Mucous membranes moist.  PULM: Scattered rhonchi.  Improved w/ cough  CV: RRR, no MGR  Ab: BS+, soft, nontender Ext: notable edema in bilateral hands  Neuro: awake, follows commands, equal strength bilaterally  LABS: CBC  Recent Labs Lab 07/01/14 0515 07/04/14 0530 07/06/14 0530  HGB 11.8* 11.8* 11.7*  HCT 36.6 37.0 36.0  WBC 13.3* 12.1* 9.1  PLT 366 445* 409*   CHEMISTRY  Recent Labs Lab 07/01/14 0515 07/02/14 0536 07/03/14 0601 07/04/14 0530  07/05/14 0513 07/06/14 0530  NA 142 146* 148* 139 135 136  K 3.8 3.9 3.2* 3.1* 3.3* 3.4*  CL 101 107 112 98 93* 98  CO2 34* 32 30 35* 32 32  GLUCOSE 125* 106* 103* 127* 115* 113*  BUN 22 25* 20 20 15 11   CREATININE 0.35* 0.39* 0.40* 0.54 0.48* 0.57  CALCIUM 8.4 8.6 8.4 8.4 8.3* 8.5  MG 2.3  --   --   --  2.3  --   PHOS 3.7  --   --   --   --   --    Estimated Creatinine Clearance: 53.4 mL/min (by C-G formula based on Cr of 0.57).  Recent Labs  07/05/14 1944 07/05/14 2352 07/06/14 0415  GLUCAP 129* 98 113*    IMAGING x48h Dg Swallowing Func-speech Pathology  07/04/2014   Lorre Nick, CCC-SLP     07/04/2014  4:49 PM  Objective Swallowing Evaluation: Modified Barium Swallowing Study   Patient Details  Name: Elaine Le MRN: [id number] Date of Birth: Oct 26, 1960  Today's Date: 07/04/2014 Time: SLP Start Time (ACUTE ONLY): 1530-SLP Stop Time (ACUTE  ONLY): 1550 SLP Time Calculation (min) (ACUTE ONLY): 20 min  Past Medical History:  Past Medical History  Diagnosis Date  . Chronic sinusitis   . Arthritis   . Fibromyalgia   . GERD (gastroesophageal reflux disease)   . Irritable bowel syndrome (IBS)   . Headaches, cluster   . Back pain   . Fatigue   . Muscle pain   . Night sweats   . Continuous urine leakage   . Diverticulosis   . Adenomatous colon polyp   . Internal hemorrhoids   . Hiatal hernia    Past Surgical History:  Past Surgical History  Procedure Laterality Date  . Thoracic outlet surgery    . Tubal ligation    . Vulva surgery    . Neuroplasty / transposition median nerve at carpal tunnel  bilateral    . Cystostomy w/ bladder dilation      at age 75   HPI:  HPI: 54 year old female admitted 06/21/14 due to SOB, cough,  congestion. Pt was intubated 2/24-06/28/14, then 3/2-07/01/14 due to  AECOPD and CAP. Pt with multisystem organ failure, difficult  intubation. Once extubated, pt continued to have respiratory  distress and anxiety, and required bipap and ativan on and off  until 07/03/14.  SLP able to evaluate pt at bedside 07/04/14, with  recommendation for NPO status until MBS completed.   No Data Recorded  Assessment / Plan / Recommendation CHL IP CLINICAL IMPRESSIONS 07/04/2014  Dysphagia Diagnosis (None)  Clinical impression Mild-moderate pharyngeal dysphagia,  characterized by delayed swallow reflex, due to decreased  sensation for primary trigger site. Trigger was noted to occur at  the level of the vallecular sinus across consistencies.  Vallecular residue was evident across consistencies, moreso with  liquid and puree, due to poor epiglottic deflection. Solid  consistency caused better  epiglottic inversion, but was still  inconsistent, likely due to swelling. Trace pyriform residue was  noted intermittently due to decreased pharyngeal contraction.  Multiple dry swallows were effective to reduce vallecular abd  pyriform residue. Intermittent flash penetration was noted on  nectar thick liquid via teaspoon, but no penetration was seen on  nectar thick liquid via cup sip. Frank aspiration of thin liquid  during the swallow was noted, with immediate cough response. Chin  tuck position increased the amount of aspirate, and does not  affect valecular residue, so CHIN TUCK position IS NOT  RECOMMENDED for this pt. Recommend beginning with dys 2 diet for  energy conservation, nectar thick liquids via cup sip. Ice chips  are allowed 1 at a time by RN or SLP following thorough oral  care. Safe swallow precautions posted at [facility] and reviewed at  length with pt and her sister. These include taking rest breaks,  minimize distractions, small bites/sips at slow rate, multiple  swallows per bite/sip, smaller more frequent meals. ST to follow  for diet tolerance and readiness to liberalize diet.        CHL IP TREATMENT RECOMMENDATION 07/04/2014  Treatment Plan Recommendations Therapy as outlined in treatment  plan below     CHL IP DIET RECOMMENDATION 07/04/2014  Diet Recommendations Dysphagia 2 (Fine chop);Nectar-thick  liquid;Ice chips PRN after oral care  Liquid Administration via Cup;No straw  Medication Administration (No Data)  Compensations Small sips/bites;Slow rate;Multiple dry swallows  after each bite/sip;Follow solids with liquid  Postural Changes and/or Swallow Maneuvers Seated upright 90  degrees;Upright 30-60 min after meal     CHL IP OTHER RECOMMENDATIONS 07/04/2014  Recommended Consults (None)  Oral Care Recommendations Oral care Q4 per protocol  Other Recommendations Remove water pitcher     CHL IP FOLLOW UP RECOMMENDATIONS 07/04/2014  Follow up Recommendations (No Data)     CHL IP FREQUENCY AND DURATION  07/04/2014  Speech Therapy Frequency (ACUTE ONLY) min 2x/week  Treatment Duration 2 weeks     Pertinent Vitals/Pain No pain    SLP Swallow Goals No flowsheet data found. Diet tolerance No flowsheet data found.    CHL IP REASON FOR REFERRAL 07/04/2014  Reason for Referral Objectively evaluate swallowing function     CHL IP ORAL PHASE 07/04/2014  Lips (None)  Tongue (None)  Mucous membranes (None)  Nutritional status (None)  Other (None)  Oxygen therapy (None)  Oral Phase WFL  Oral - Pudding Teaspoon (None)  Oral - Pudding Cup (None)  Oral - Honey Teaspoon (None)  Oral - Honey Cup (None)  Oral - Honey Syringe (None)  Oral - Nectar Teaspoon (None)  Oral - Nectar Cup (None)  Oral - Nectar Straw (None)  Oral - Nectar Syringe (None)  Oral - Ice Chips (None)  Oral - Thin Teaspoon (None)  Oral - Thin Cup (None)  Oral - Thin Straw (None)  Oral - Thin Syringe (None)  Oral - Puree (None)  Oral - Mechanical Soft (None)  Oral - Regular (None)  Oral - Multi-consistency (None)  Oral - Pill (None)  Oral Phase - Comment (None)      CHL IP PHARYNGEAL PHASE 07/04/2014  Pharyngeal Phase Impaired  Pharyngeal - Pudding Teaspoon (None)  Penetration/Aspiration details (pudding teaspoon) (None)  Pharyngeal - Pudding Cup (None)  Penetration/Aspiration details (pudding cup) (None)  Pharyngeal - Honey Teaspoon (None)  Penetration/Aspiration details (honey teaspoon) (None)  Pharyngeal - Honey Cup (None)  Penetration/Aspiration details (honey cup) (None)  Pharyngeal - Honey Syringe (None)  Penetration/Aspiration details (honey syringe) (None)  Pharyngeal - Nectar Teaspoon Delayed swallow initiation;Premature  spillage to valleculae;Reduced epiglottic inversion;Reduced  airway/laryngeal closure;Reduced tongue base  retraction;Pharyngeal residue - valleculae;Pharyngeal residue -  pyriform sinuses;Reduced anterior laryngeal mobility;Reduced  laryngeal elevation;Penetration/Aspiration after swallow  Penetration/Aspiration details (nectar teaspoon) Material  enters  airway, remains ABOVE vocal cords then ejected out  Pharyngeal - Nectar Cup Delayed swallow initiation;Premature  spillage to valleculae;Reduced epiglottic inversion;Reduced  tongue base retraction;Pharyngeal residue - valleculae;Pharyngeal  residue - pyriform sinuses;Reduced anterior laryngeal  mobility;Reduced laryngeal elevation  Penetration/Aspiration details (nectar cup) (None)  Pharyngeal - Nectar Straw (None)  Penetration/Aspiration details (nectar straw) (None)  Pharyngeal - Nectar Syringe (None)  Penetration/Aspiration details (nectar syringe) (None)  Pharyngeal - Ice Chips (None)  Penetration/Aspiration details (ice chips) (None)  Pharyngeal - Thin Teaspoon (None)  Penetration/Aspiration details (thin teaspoon) (None)  Pharyngeal - Thin Cup Delayed swallow initiation;Premature  spillage to valleculae;Reduced epiglottic inversion;Reduced  tongue base retraction;Pharyngeal residue - valleculae;Pharyngeal  residue - pyriform sinuses;Reduced anterior laryngeal  mobility;Reduced laryngeal elevation;Penetration/Aspiration  during swallow;Compensatory strategies attempted (Comment)  Penetration/Aspiration details (thin cup) Material enters airway,  passes BELOW cords and not ejected out despite cough attempt by  patient  Pharyngeal - Thin Straw (None)  Penetration/Aspiration details (thin straw) (None)  Pharyngeal - Thin Syringe (None)  Penetration/Aspiration details (thin syringe') (None)  Pharyngeal - Puree Delayed swallow initiation;Premature spillage  to valleculae;Reduced epiglottic inversion;Reduced tongue base  retraction;Pharyngeal residue - valleculae;Reduced anterior  laryngeal mobility;Reduced laryngeal elevation  Penetration/Aspiration details (puree) (None)  Pharyngeal - Mechanical Soft (None)  Penetration/Aspiration details (mechanical soft) (None)  Pharyngeal - Regular (None)  Penetration/Aspiration details (regular) (None)  Pharyngeal - Multi-consistency Delayed swallow   initiation;Premature spillage to valleculae;Reduced epiglottic  inversion;Reduced tongue base retraction;Pharyngeal residue -  valleculae;Reduced anterior laryngeal mobility;Reduced laryngeal  elevation  Penetration/Aspiration details (multi-consistency) (None)  Pharyngeal - Pill (None)  Penetration/Aspiration details (pill) (None)  Pharyngeal Comment inconsistent epiglottic inversion with solids.      CHL IP CERVICAL ESOPHAGEAL PHASE 07/04/2014  Cervical Esophageal Phase WFL  Pudding Teaspoon (None)  Pudding Cup (None)  Honey Teaspoon (None)  Honey Cup (None)  Honey Syringe (None)  Nectar Teaspoon (None)  Nectar Cup (None)  Nectar Straw (None)  Nectar Syringe (None)  Thin Teaspoon (None)  Thin Cup (None)  Thin Straw (None)  Thin Syringe (None)  Cervical Esophageal Comment (None)    No flowsheet data found.  Celia B. Quentin Ore [facility] [place] [phone]       Shonna Chock 07/04/2014, 4:48 PM   aeration worse  ASSESSMENT / PLAN:  Acute Hypoxic Respiratory failure - in setting of AECOPD + Flu A + CAP; and pulmonary edema  2/28 Tobacco Abuse - smoker, no baseline PFT's Plan:   Taper steroids  Continue budesonide and brovana Aim for even to slightly neg volume status Added daily oral lasix w/ KCL supp Follow CXR intermittently Continue nicotine patch  Flutter valve Change abx to augmentin, complete 8d total rx.  Set up f/u with [facility] on discharge for PFT's and management of presumed COPD.  Shaggy mobile density on MV (LV side) new finding on ECHO 3/8. ? endocarditis Plan:  TEE to further evaluate possible mitral vegetation plan for  Monday  Dysphagia  Plan:   Stress ulcer prophylaxis - Pantoprazole Bowel regimen - senna Continue diet per SLEP recommendation.   Deconditioning s/p critical illness  Plan:   PT/OT - advance activity as tolerated   Family updated: daily by Kary Kos   NP summary  Improved work of breathing, did not require NIPPV or anxiolytics overnight.  Afebrile and WBC stable. Ready to moved to tele. No distress. Will de-escalate abx, d/c cvl and foley. Cont rehab efforts. Will await ECHO Monday for further identification of mass on mitral valve.   Erick Colace ACNP-BC [place] Pager # [phone] OR # [phone] if no answer  Attending:  I have seen and examined the patient with nurse practitioner/resident and agree with the note above.   Jackelyn Poling looks  even better today  Lungs still with a lot of rhonchi and wheezing She is stronger, good spirits  Impression/Rec: 1) CAP > improving, narrow Abx to oral antibiotics today 2) COPD> continue to wean prednisone 3) Deconditioning> PT 4) Protein calorie malnutrition> put PO intake 5) Endocarditis? > TEE Monday  To floor today  Roselie Awkward, MD [place] PCCM Pager: [phone] Cell: [phone] If no response, call [phone]

## 2014-07-06 NOTE — Progress Notes (Signed)
Physical Therapy Treatment Patient Details Name: Elaine Le MRN: [id number] DOB: 09/29/1960 Today's Date: 07/06/2014    History of Present Illness Admitted 06/21/14 with resp fail and intubated.    PT Comments    Pt transferred from ICU to telemetry.  "feeling better".  Assisted pt OOB to amb a great deal in the hallway with RW for safety only.  Believe pt will not need one for D/C.  Sister assisted by following with recliner.  Great progress.  Follow Up Recommendations  No PT follow up     Equipment Recommendations  None recommended by PT    Recommendations for Other Services       Precautions / Restrictions Precautions Precautions: Fall Precaution Comments: monitor sats Restrictions Weight Bearing Restrictions: No    Mobility  Bed Mobility Overal bed mobility: Modified Independent             General bed mobility comments: increased time  Transfers Overall transfer level: Needs assistance Equipment used: Rolling walker (2 wheeled) Transfers: Sit to/from Stand Sit to Stand: Min guard;Supervision         General transfer comment: one VC on safety with turns using RW  Ambulation/Gait Ambulation/Gait assistance: Min guard Ambulation Distance (Feet): 350 Feet Assistive device: Rolling walker (2 wheeled) Gait Pattern/deviations: Step-through pattern     General Gait Details: cues for posture, sequence and position from RW.  amb on RA sats avg 94%   Stairs            Wheelchair Mobility    Modified Rankin (Stroke Patients Only)       Balance                                    Cognition Arousal/Alertness: Awake/alert Behavior During Therapy: WFL for tasks assessed/performed Overall Cognitive Status: Within Functional Limits for tasks assessed                      Exercises      General Comments        Pertinent Vitals/Pain Pain Assessment: No/denies pain    Home Living                      Prior Function            PT Goals (current goals can now be found in the care plan section) Progress towards PT goals: Progressing toward goals    Frequency  Min 3X/week    PT Plan      Co-evaluation             End of Session Equipment Utilized During Treatment: Gait belt Activity Tolerance: Patient limited by fatigue Patient left: in chair;with call bell/phone within reach;with family/visitor present     Time: 1345-1415 PT Time Calculation (min) (ACUTE ONLY): 30 min  Charges:  $Gait Training: 8-22 mins $Therapeutic Activity: 8-22 mins                    G Codes:      Rica Koyanagi  PTA [facility]  Acute  Rehab Pager      [phone]

## 2014-07-06 NOTE — Consult Note (Signed)
Referral received from MD Director in long length of stay meeting. Went to bedside to speak with patient to offer and explain services. She states she wants to think about it prior to consenting. Left brochure and contact information for her to call if she changes her mind. Will make inpatient RNCM aware.  Marthenia Rolling, MSN- [facility] [phone]

## 2014-07-06 NOTE — Progress Notes (Signed)
Pt is scheduled for TEE on Monday July 09, 2013 @ 10am. TEE will be done at [facility] [place] transport arranged.

## 2014-07-06 NOTE — Progress Notes (Signed)
SUBJECTIVE: No complaints. No pain. Breathing is better.   BP 87/51 mmHg  Pulse 63  Temp(Src) 98.5 F (36.9 C) (Oral)  Resp 16  Ht 5\' 4"  (1.626 m)  Wt 92 lb 13 oz (42.1 kg)  BMI 15.92 kg/m2  SpO2 99%  Intake/Output Summary (Last 24 hours) at 07/06/14 9233 Last data filed at 07/06/14 0600  Gross per 24 hour  Intake 2017.5 ml  Output   1770 ml  Net  247.5 ml    PHYSICAL EXAM General: Well developed, well nourished, in no acute distress. Alert and oriented x 3.  Psych:  Good affect, responds appropriately Neck: No JVD. No masses noted.  Lungs: Clear bilaterally with no wheezes or rhonci noted.  Heart: RRR with systolic murmur.  Abdomen: Bowel sounds are present. Soft, non-tender.  Extremities: No lower extremity edema.   LABS: Basic Metabolic Panel:  Recent Labs  07/04/14 0530 07/05/14 0513  NA 139 135  K 3.1* 3.3*  CL 98 93*  CO2 35* 32  GLUCOSE 127* 115*  BUN 20 15  CREATININE 0.54 0.48*  CALCIUM 8.4 8.3*  MG  --  2.3   CBC:  Recent Labs  07/04/14 0530  WBC 12.1*  HGB 11.8*  HCT 37.0  MCV 94.4  PLT 445*   Current Meds: . antiseptic oral rinse  7 mL Mouth Rinse BID  . arformoterol  15 mcg Nebulization Q12H  . budesonide (PULMICORT) nebulizer solution  0.25 mg Nebulization Q12H  . enoxaparin (LOVENOX) injection  40 mg Subcutaneous Q24H  . insulin aspart  0-15 Units Subcutaneous 6 times per day  . nicotine  14 mg Transdermal Daily  . pantoprazole  40 mg Oral Daily  . piperacillin-tazobactam (ZOSYN)  IV  3.375 g Intravenous Q8H  . predniSONE  30 mg Oral Q breakfast  . vancomycin  1,000 mg Intravenous Q24H   Echo 07/04/14: Left ventricle: The cavity size was normal. Systolic function was normal. The estimated ejection fraction was in the range of 60% to 65%. Wall motion was normal; there were no regional wall motion abnormalities. - Aortic valve: Mildly calcified annulus. Trileaflet. Minimal diffuse calcification. There was trivial  regurgitation. - Mitral valve: The mitral valve leaflets are thickened and there is evidence of diastolic doming of the anterior mitral valve leaftet. The posterior leaflet is thickened and appears fixed. The anterior mitral valve leaflet has a large shaggy mobile density on the LV side of the leaflet that is only appreciated in the apical 4 and 2 chamber views. It is unclear as to whether the patient has underlying rheumatic mitral stenosis and now has possible endocarditis. This needs to be evaluated further with TEE. Moderate diffuse thickening of the anterior leaflet. The findings are consistent with moderate stenosis. There was moderate regurgitation directed eccentrically and along the left atrial wall. Valve area by pressure half-time: 1.16 cm^2.  ASSESSMENT AND PLAN: 54 year old woman was admitted with severe respiratory insufficiency requiring intubation and mechanical ventilation. She has no prior cardiac issues but years ago she was told by a doctor that she had a heart murmur but then was told by another doctor that she did not have a heart murmur. She denies any history of rheumatic fever. She does not have any history of angina pectoris or ischemic heart disease. EKG on admission showed probable left atrial enlargement. She is in normal sinus rhythm. She is a former smoker. She is found to have a large shaggy mobile density on the LV side  of the leaflet on echo 07/04/14. She was see by Dr. Mare Ferrari on 07/05/14 and recommendation was made for TEE next week to better define mass.   1. Mitral valve density: Cannot exclude endocarditis. Currently on antibiotics. Will need TEE when she has improved from a respiratory standpoint. Will follow along and help with planning of TEE. No anti-coagulation is indicated at this point in regards to the valve mass. I will place her on the TEE schedule for Monday and our team will follow along over the weekend.   2. Respiratory  failure: Due to COPD exacerbation with CAP. PCCM is managing. Pt is now extubated.    MCALHANY,CHRISTOPHER  3/10/20166:05 AM

## 2014-07-07 LAB — BASIC METABOLIC PANEL
Anion gap: 7 (ref 5–15)
BUN: 12 mg/dL (ref 6–23)
CO2: 31 mmol/L (ref 19–32)
Calcium: 8.7 mg/dL (ref 8.4–10.5)
Chloride: 102 mmol/L (ref 96–112)
Creatinine, Ser: 0.45 mg/dL — ABNORMAL LOW (ref 0.50–1.10)
GFR calc Af Amer: >90 mL/min (ref >90)
GLUCOSE: 104 mg/dL — AB (ref 70–99)
Potassium: 3.8 mmol/L (ref 3.5–5.1)
SODIUM: 140 mmol/L (ref 135–145)

## 2014-07-07 MED ORDER — PREDNISONE 20 MG PO TABS
20.0000 mg | ORAL_TABLET | Freq: Every day | ORAL | Status: DC
  Administered 2014-07-08 – 2014-07-11 (×3): 20 mg via ORAL
  Filled 2014-07-07 (×4): qty 1

## 2014-07-07 MED ORDER — BUDESONIDE-FORMOTEROL FUMARATE 160-4.5 MCG/ACT IN AERO
2.0000 | INHALATION_SPRAY | Freq: Two times a day (BID) | RESPIRATORY_TRACT | Status: DC
  Administered 2014-07-08 – 2014-07-11 (×5): 2 via RESPIRATORY_TRACT
  Filled 2014-07-07: qty 6

## 2014-07-07 NOTE — Progress Notes (Signed)
TEE is planned for Monday. She will have to be transported to [facility] via [facility] for the procedure on Monday. Orders have been placed. NPO at midnight on Sunday night. We will make further recommendations following the TEE. Please call with questions over the weekend.   Elaine Le 07/07/2014 6:53 AM

## 2014-07-07 NOTE — Progress Notes (Signed)
Speech Language Pathology Treatment: Dysphagia  Patient Details Name: Elaine Le MRN: [id number] DOB: Sep 21, 1960 Today's Date: 07/07/2014 Time: 1747-1595 SLP Time Calculation (min) (ACUTE ONLY): 16 min  Assessment / Plan / Recommendation Clinical Impression  Follow up for possible diet and liquid upgrade following audible/sensed aspiration during MBS 3/8. Vocal quality significantly improved from 3/9 without evidence of hoarse quality. Delayed throat clears following 2 of 8 cup sips trials thin. Mastication functional with solid. Pt recognized the need to take smaller sips and is aware of increased velocity with thin versus thick. SLP educated pt not to mix consistencies in oral cavity and avoid speaking immediately after swallows. SLP will upgrade to regular texture and thin liquids, pt prefers cups over straws as well as pills whole in applesauce. No follow up needed.   HPI HPI: 54 year old female admitted 06/21/14 due to SOB, cough, congestion. Pt was intubated 2/24-06/28/14, then 3/2-07/01/14 due to AECOPD and CAP. Pt with multisystem organ failure, difficult intubation. Once extubated, pt continued to have respiratory distress and anxiety, and required bipap and ativan on and off until 07/03/14.  SLP able to evaluate pt at bedside 07/04/14, with recommendation for NPO status until MBS completed.    Pertinent Vitals Pain Assessment: No/denies pain  SLP Plan  All goals met;Discharge SLP treatment due to (comment)    Recommendations Diet recommendations: Regular;Thin liquid Liquids provided via: Cup (does not like to use straws) Medication Administration: Whole meds with puree (pills in applesauce, pt preference) Supervision: Patient able to self feed;Intermittent supervision to cue for compensatory strategies Compensations: Slow rate;Small sips/bites Postural Changes and/or Swallow Maneuvers: Seated upright 90 degrees              Oral Care Recommendations: Oral care BID Follow up  Recommendations: None Plan: All goals met;Discharge SLP treatment due to (comment)    GO     Houston Siren 07/07/2014, 10:54 AM  Orbie Pyo Colvin Caroli.Ed [employer] [phone]

## 2014-07-07 NOTE — Progress Notes (Signed)
Physical Therapy Treatment Patient Details Name: Elaine Le MRN: [id number] DOB: [birth year] Today's Date: 07/07/2014    History of Present Illness Admitted 06/21/14 with resp fail and intubated.    PT Comments    Pt OOB in recliner on 2 lts O2.  Assisted with amb full unit on RA avg sat 94% and HR 77.   Pt feeling "better each day".    Follow Up Recommendations  No PT follow up     Equipment Recommendations       Recommendations for Other Services       Precautions / Restrictions Precautions Precautions: Fall Precaution Comments: monitor sats Restrictions Weight Bearing Restrictions: No    Mobility  Bed Mobility               General bed mobility comments: Pt OOB in recliner  Transfers Overall transfer level: Needs assistance Equipment used: Rolling walker (2 wheeled) Transfers: Sit to/from Stand Sit to Stand: Supervision         General transfer comment: increased time  Ambulation/Gait Ambulation/Gait assistance: Supervision;Min guard Ambulation Distance (Feet): 350 Feet Assistive device: Rolling walker (2 wheeled)   Gait velocity: decr   General Gait Details: cues for posture, sequence and position from RW.  amb on RA sats avg 94%   Stairs            Wheelchair Mobility    Modified Rankin (Stroke Patients Only)       Balance                                    Cognition Arousal/Alertness: Awake/alert Behavior During Therapy: WFL for tasks assessed/performed Overall Cognitive Status: Within Functional Limits for tasks assessed                      Exercises      General Comments        Pertinent Vitals/Pain Pain Assessment: No/denies pain    Home Living                      Prior Function            PT Goals (current goals can now be found in the care plan section) Progress towards PT goals: Progressing toward goals    Frequency  Min 3X/week    PT Plan       Co-evaluation             End of Session Equipment Utilized During Treatment: Gait belt Activity Tolerance: Patient limited by fatigue Patient left: in chair;with call bell/phone within reach;with family/visitor present     Time: 4709-6283 PT Time Calculation (min) (ACUTE ONLY): 17 min  Charges:  $Gait Training: 8-22 mins                    G Codes:      Rica Koyanagi  PTA [facility]  Acute  Rehab Pager      [phone]

## 2014-07-07 NOTE — Progress Notes (Signed)
PULMONARY / CRITICAL CARE MEDICINE   Name: Elaine Le MRN: [id number] DOB: 06-15-1960    ADMISSION DATE:  06/21/2014 CONSULTATION DATE:  07/07/2014  REFERRING MD :  EDP  CHIEF COMPLAINT:  SOB  INITIAL PRESENTATION:  54 y.o. F, smoker,  admitted 2/24 with a 3 day hx of cough and SOB.  On arrival was found to have significant respiratory distress and hypoxia. Patient failed bipap and was intubated with a #6.5 ETT in ER, difficult intubation.   STUDIES:  CTA chest 2/24 >>> no PE, underlying emphysema.  Bilateral PNA, right hilar and mediastinal reactive lymphadenopathy. Echo 3/8 >>> Normal systolic function. EF 60-65%. Anterior mitral valve leaflet with large shaggy mobile density on the LV side of the leaflet appreciated only in apical 4 and 2 chamber views. Unclear if patient has underlying rheumatic mitral stenosis and now possible endocarditis. This needs to be evaluated further with TEE. Moderate diffuse thickening of the anterior leaflet consistent with moderate stenosis.   Culture data/abx  Abx: Ceftriaxone, start date 2/24, completed 7d Abx: Azithromycin, start date 2/24, completed 7d Antiviral Tamiflu 2/25 >> 2/29 Vancomycin 3/7 >>> Zosyn 3/7>>> Trend fever and WBC curve  Blood cultures x 2 3/10>>>   SIGNIFICANT EVENTS: 2/24  Admit, intubated; left IJ CVL placed  2/25  Levo @ 10 mcg, sedate on vent.  Family concerned she has an 'allergy to albuterol'.   2/26  Weaned to 4 mcg levo, awake on vent, no acute events overnight, aspiration? 2/27  weaning levophed 3/1    right thoracentesis -->transudate  3/2   Extubated. Reintubated for increased work of breathing. Levophed restarted overnight.  3/4 extubated 3/7 on BIPAP on and off since extubation. Work of breathing worse. ABX widened for HCAP 3/8 remarkably better 3/9 asked Cards to see re: ECHO findings. Plan for TEE.  3/10: problems now resolved over ICU stay. Acute encephalopathy; hypernatremia, septic shock;  Bilateral R>L pleural effusions -->right transudate by light's criteria. Transferred to tele. CVL removed. Changed abx to augmentin   SUBJECTIVE:   Feels better.   VITAL SIGNS: Temp:  [98.5 F (36.9 C)-98.8 F (37.1 C)] 98.5 F (36.9 C) (03/11 0456) Pulse Rate:  [75-89] 75 (03/11 0633) Resp:  [18-20] 18 (03/11 0456) BP: (89-102)/(50-62) 101/57 mmHg (03/11 0633) SpO2:  [97 %-100 %] 100 % (03/11 0456) Weight:  [41.867 kg (92 lb 4.8 oz)] 41.867 kg (92 lb 4.8 oz) (03/11 0702)     Room air   INTAKE / OUTPUT: Intake/Output      03/10 0701 - 03/11 0700 03/11 0701 - 03/12 0700   P.O. 480 360   I.V. (mL/kg) 100 (2.4)    IV Piggyback     Total Intake(mL/kg) 580 (13.8) 360 (8.6)   Urine (mL/kg/hr) 525 (0.5)    Stool     Total Output 525     Net +55 +360        Urine Occurrence 2 x    Stool Occurrence 1 x      PHYSICAL EXAMINATION:  Gen: thin woman, awake, weak after critical illness  HEENT: NCAT. Mucous membranes moist.  PULM: Scattered rhonchi.  Improved w/ cough better phonation  CV: RRR, no MGR  Ab: BS+, soft, nontender Ext: notable edema in bilateral hands  Neuro: awake, follows commands, equal strength bilaterally  LABS: CBC  Recent Labs Lab 07/01/14 0515 07/04/14 0530 07/06/14 0530  HGB 11.8* 11.8* 11.7*  HCT 36.6 37.0 36.0  WBC 13.3* 12.1* 9.1  PLT 366 445* 409*  CHEMISTRY  Recent Labs Lab 07/01/14 0515  07/03/14 0601 07/04/14 0530 07/05/14 0513 07/06/14 0530 07/07/14 0525  NA 142  < > 148* 139 135 136 140  K 3.8  < > 3.2* 3.1* 3.3* 3.4* 3.8  CL 101  < > 112 98 93* 98 102  CO2 34*  < > 30 35* 32 32 31  GLUCOSE 125*  < > 103* 127* 115* 113* 104*  BUN 22  < > 20 20 15 11 12   CREATININE 0.35*  < > 0.40* 0.54 0.48* 0.57 0.45*  CALCIUM 8.4  < > 8.4 8.4 8.3* 8.5 8.7  MG 2.3  --   --   --  2.3  --   --   PHOS 3.7  --   --   --   --   --   --   < > = values in this interval not displayed. Estimated Creatinine Clearance: 53.2 mL/min (by C-G formula  based on Cr of 0.45).  Recent Labs  07/05/14 2352 07/06/14 0415 07/06/14 0732  GLUCAP 98 113* 115*    IMAGING x48h No results found.aeration worse  ASSESSMENT / PLAN:  Acute Hypoxic Respiratory failure - in setting of AECOPD + Flu A + CAP; and pulmonary edema  2/28 Tobacco Abuse - smoker, no baseline PFT's Plan:   Taper steroids  Change to symbicort Aim for even to slightly neg volume status Added daily oral lasix w/ KCL supp 2 view chest 3/12 Continue nicotine patch  Flutter valve Changed abx to augmentin, complete 8d total rx.  Set up f/u with [facility] on discharge for PFT's and management of presumed COPD.  Shaggy mobile density on MV (LV side) new finding on ECHO 3/8. ? endocarditis Plan:  TEE to further evaluate possible mitral vegetation plan for  Monday  Dysphagia  Plan:   Stress ulcer prophylaxis - Pantoprazole Bowel regimen - senna Continue diet per SLEP recommendation.   Deconditioning s/p critical illness  Plan:   PT/OT - advance activity as tolerated   Family updated: daily by Kary Kos   NP summary  Improved work of breathing,  Cont rehab efforts. Will await ECHO Monday for further identification of mass on mitral valve. Otherwise on sig change to rx.   Erick Colace ACNP-BC [place] Pager # [phone] OR # [phone] if no answer

## 2014-07-07 NOTE — Progress Notes (Signed)
Pt hypotensive at this time however is completely asymptomatic and was sleeping soundly prior to vitals being obtained. (See values below) Noted that pt had a drop in BP as well in the am of 3/9. Discussed with pt and she states that this is "normal" for her and that yesterday no interventions were performed. Will recheck BP again within 90 minutes and if it remains less than 90 systolic, will page MD on call at that time. Filed Vitals:   07/07/14 0456  BP: 89/50  Pulse: 85  Temp: 98.5 F (36.9 C)  Resp: 18

## 2014-07-08 ENCOUNTER — Inpatient Hospital Stay (HOSPITAL_COMMUNITY): Payer: Medicare Other

## 2014-07-08 MED ORDER — IPRATROPIUM-ALBUTEROL 0.5-2.5 (3) MG/3ML IN SOLN
3.0000 mL | Freq: Four times a day (QID) | RESPIRATORY_TRACT | Status: DC
  Administered 2014-07-08 – 2014-07-09 (×7): 3 mL via RESPIRATORY_TRACT
  Filled 2014-07-08 (×8): qty 3

## 2014-07-08 MED ORDER — GUAIFENESIN ER 600 MG PO TB12
1200.0000 mg | ORAL_TABLET | Freq: Two times a day (BID) | ORAL | Status: DC
  Administered 2014-07-08 – 2014-07-11 (×6): 1200 mg via ORAL
  Filled 2014-07-08 (×6): qty 2

## 2014-07-08 MED ORDER — PANTOPRAZOLE SODIUM 40 MG PO TBEC
40.0000 mg | DELAYED_RELEASE_TABLET | Freq: Two times a day (BID) | ORAL | Status: DC
  Administered 2014-07-08 – 2014-07-11 (×5): 40 mg via ORAL
  Filled 2014-07-08 (×5): qty 1

## 2014-07-08 MED ORDER — SODIUM CHLORIDE 0.9 % IJ SOLN
3.0000 mL | Freq: Two times a day (BID) | INTRAMUSCULAR | Status: DC
  Administered 2014-07-08 – 2014-07-10 (×4): 3 mL via INTRAVENOUS

## 2014-07-08 MED ORDER — VANCOMYCIN HCL IN DEXTROSE 1-5 GM/200ML-% IV SOLN
1000.0000 mg | Freq: Every day | INTRAVENOUS | Status: DC
  Administered 2014-07-08 – 2014-07-09 (×2): 1000 mg via INTRAVENOUS
  Filled 2014-07-08 (×3): qty 200

## 2014-07-08 NOTE — Progress Notes (Signed)
Pt having nonsustained episodes of bradycardia, as low as 37, with pause of 1-2 seconds per CMU. This happened 6x. Upon assessment, found pt resting, asymptomatic and respirations WNL. Hospitalist notified and aware. Will continue to monitor.

## 2014-07-08 NOTE — Progress Notes (Addendum)
ANTIBIOTIC CONSULT NOTE - INITIAL  Pharmacy Consult for Vancomycin (restart) Indication: possible MRSA, endocarditis  Allergies  Allergen Reactions  . Albuterol   . Morphine And Related     Altered mental state    Patient Measurements: Height: 5\' 4"  (162.6 cm) Weight: 88 lb 11.2 oz (40.234 kg) IBW/kg (Calculated) : 54.7 Adjusted Body Weight: n/a  Vital Signs: Temp: 98.2 F (36.8 C) (03/12 0636) Temp Source: Oral (03/12 0636) BP: 98/57 mmHg (03/12 0636) Pulse Rate: 84 (03/12 0636) Intake/Output from previous day: 03/11 0701 - 03/12 0700 In: 1080 [P.O.:1080] Out: -  Intake/Output from this shift:    Labs:  Recent Labs  07/06/14 0530 07/07/14 0525  WBC 9.1  --   HGB 11.7*  --   PLT 409*  --   CREATININE 0.57 0.45*   Estimated Creatinine Clearance: 51 mL/min (by C-G formula based on Cr of 0.45). No results for input(s): VANCOTROUGH, VANCOPEAK, VANCORANDOM, GENTTROUGH, GENTPEAK, GENTRANDOM, TOBRATROUGH, TOBRAPEAK, TOBRARND, AMIKACINPEAK, AMIKACINTROU, AMIKACIN in the last 72 hours.   Microbiology: Recent Results (from the past 720 hour(s))  Blood culture (routine x 2)     Status: None   Collection Time: 06/21/14  5:51 PM  Result Value Ref Range Status   Specimen Description BLOOD LEFT ARM  Final   Special Requests BOTTLES DRAWN AEROBIC ONLY 5CC  Final   Culture   Final    NO GROWTH 5 DAYS Performed at [facility]    Report Status 06/27/2014 FINAL  Final  Blood culture (routine x 2)     Status: None   Collection Time: 06/21/14  5:55 PM  Result Value Ref Range Status   Specimen Description BLOOD RIGHT ANTECUBITAL  Final   Special Requests BOTTLES DRAWN AEROBIC AND ANAEROBIC 5CC EACH  Final   Culture   Final    NO GROWTH 5 DAYS Performed at [facility]    Report Status 06/27/2014 FINAL  Final  MRSA PCR Screening     Status: None   Collection Time: 06/21/14 10:45 PM  Result Value Ref Range Status   MRSA by PCR NEGATIVE NEGATIVE Final   Comment:        The GeneXpert MRSA Assay (FDA approved for NASAL specimens only), is one component of a comprehensive MRSA colonization surveillance program. It is not intended to diagnose MRSA infection nor to guide or monitor treatment for MRSA infections.   Respiratory virus panel (routine influenza)     Status: Abnormal   Collection Time: 06/22/14  5:42 AM  Result Value Ref Range Status   Respiratory Syncytial Virus A Negative Negative Final   Respiratory Syncytial Virus B Negative Negative Final   Influenza A Positive (A) Negative Final    Comment: Positive for Influenza A 2009 H1N1   Influenza B Negative Negative Final   Parainfluenza 1 Negative Negative Final   Parainfluenza 2 Negative Negative Final   Parainfluenza 3 Negative Negative Final   Metapneumovirus Negative Negative Final   Rhinovirus Negative Negative Final   Adenovirus Negative Negative Final    Comment: (NOTE) Performed At: [facility] [address] [place], [place] [id number] Lindon Romp MD NW:[id number]   Culture, respiratory (NON-Expectorated)     Status: None   Collection Time: 06/22/14  4:39 PM  Result Value Ref Range Status   Specimen Description TRACHEAL ASPIRATE  Final   Special Requests NONE  Final   Gram Stain   Final    RARE WBC PRESENT, PREDOMINANTLY PMN NO SQUAMOUS EPITHELIAL CELLS  SEEN NO ORGANISMS SEEN Performed at [facility]    Culture   Final    NO GROWTH 2 DAYS Performed at [facility]    Report Status 06/24/2014 FINAL  Final  Body fluid culture     Status: None   Collection Time: 06/27/14 11:00 AM  Result Value Ref Range Status   Specimen Description PLEURAL  Final   Special Requests PLEURAL  Final   Gram Stain   Final    NO WBC SEEN NO ORGANISMS SEEN Performed at [facility]    Culture   Final    NO GROWTH 3 DAYS Performed at [facility]    Report Status 07/01/2014 FINAL  Final  Culture, blood (routine x 2)      Status: None (Preliminary result)   Collection Time: 07/05/14 11:14 AM  Result Value Ref Range Status   Specimen Description BLOOD RIGHT ARM  Final   Special Requests BOTTLES DRAWN AEROBIC ONLY 3CC  Final   Culture   Final           BLOOD CULTURE RECEIVED NO GROWTH TO DATE CULTURE WILL BE HELD FOR 5 DAYS BEFORE ISSUING A FINAL NEGATIVE REPORT Performed at [facility]    Report Status PENDING  Incomplete  Culture, blood (routine x 2)     Status: None (Preliminary result)   Collection Time: 07/05/14 11:20 AM  Result Value Ref Range Status   Specimen Description BLOOD RIGHT ARM  Final   Special Requests BOTTLES DRAWN AEROBIC ONLY 3CC  Final   Culture   Final           BLOOD CULTURE RECEIVED NO GROWTH TO DATE CULTURE WILL BE HELD FOR 5 DAYS BEFORE ISSUING A FINAL NEGATIVE REPORT Performed at [facility]    Report Status PENDING  Incomplete    Medical History: Past Medical History  Diagnosis Date  . Chronic sinusitis   . Arthritis   . Fibromyalgia   . GERD (gastroesophageal reflux disease)   . Irritable bowel syndrome (IBS)   . Headaches, cluster   . Back pain   . Fatigue   . Muscle pain   . Night sweats   . Continuous urine leakage   . Diverticulosis   . Adenomatous colon polyp   . Internal hemorrhoids   . Hiatal hernia     Medications:  Scheduled:  . amoxicillin-clavulanate  1 tablet Oral BID  . antiseptic oral rinse  7 mL Mouth Rinse BID  . budesonide-formoterol  2 puff Inhalation BID  . enoxaparin (LOVENOX) injection  40 mg Subcutaneous Q24H  . furosemide  40 mg Oral Daily  . guaiFENesin  1,200 mg Oral BID  . ipratropium-albuterol  3 mL Nebulization QID  . nicotine  7 mg Transdermal Daily  . pantoprazole  40 mg Oral BID AC  . potassium chloride  40 mEq Oral Daily  . predniSONE  20 mg Oral Q breakfast  . sodium chloride  3 mL Intravenous Q12H   Infusions:   PRN: acetaminophen, albuterol, RESOURCE THICKENUP CLEAR Anti-infectives    Start      Dose/Rate Route Frequency Ordered Stop   07/06/14 1000  amoxicillin-clavulanate (AUGMENTIN) 875-125 MG per tablet 1 tablet     1 tablet Oral 2 times daily 07/06/14 0900     07/03/14 1800  piperacillin-tazobactam (ZOSYN) IVPB 3.375 g  Status:  Discontinued     3.375 g 12.5 mL/hr over 240 Minutes Intravenous Every 8 hours 07/03/14 1053 07/06/14  3295   07/03/14 1200  vancomycin (VANCOCIN) IVPB 1000 mg/200 mL premix  Status:  Discontinued     1,000 mg 200 mL/hr over 60 Minutes Intravenous Every 24 hours 07/03/14 1053 07/06/14 0859   07/03/14 1200  piperacillin-tazobactam (ZOSYN) IVPB 3.375 g     3.375 g 100 mL/hr over 30 Minutes Intravenous  Once 07/03/14 1053 07/03/14 1159   06/24/14 1600  azithromycin (ZITHROMAX) 500 mg in dextrose 5 % 250 mL IVPB     500 mg 250 mL/hr over 60 Minutes Intravenous Every 24 hours 06/24/14 1205 06/28/14 1704   06/22/14 2200  oseltamivir (TAMIFLU) 6 MG/ML suspension 75 mg     75 mg Oral 2 times daily 06/22/14 2151 06/26/14 2223   06/22/14 1600  azithromycin (ZITHROMAX) 500 mg in dextrose 5 % 250 mL IVPB  Status:  Discontinued     500 mg 250 mL/hr over 60 Minutes Intravenous Every 24 hours 06/21/14 1943 06/24/14 1205   06/22/14 1400  cefTRIAXone (ROCEPHIN) 1 g in dextrose 5 % 50 mL IVPB     1 g 100 mL/hr over 30 Minutes Intravenous Every 24 hours 06/21/14 1943 06/28/14 1441   06/22/14 1100  oseltamivir (TAMIFLU) capsule 75 mg  Status:  Discontinued     75 mg Oral 2 times daily 06/22/14 0954 06/22/14 2151   06/21/14 1800  cefTRIAXone (ROCEPHIN) 1 g in dextrose 5 % 50 mL IVPB     1 g 100 mL/hr over 30 Minutes Intravenous  Once 06/21/14 1746 06/21/14 1820   06/21/14 1800  azithromycin (ZITHROMAX) 500 mg in dextrose 5 % 250 mL IVPB     500 mg 250 mL/hr over 60 Minutes Intravenous  Once 06/21/14 1746 06/21/14 1921     Assessment: 29 yoF admitted 2/24 with SOB and cough x 2 days PTA. CT angio performed on admission shows bilateral infiltrates. Pt noted with  agonal breathing and to be acidotic in the ED, was intubated, to be admitted to the ICU. Initially received  Ceftriaxone/Azithromycin x 8d for CAP, now with increased opacity in LLL, and pulmonary secretions obtained from suctioning appear purulent.  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  To restart vancomycin per pharmacy  2/24 >> ceftriaxone >> 3/2 2/24 >> azithromycin >> 3/2 2/25 >> Tamiflu >> 2/29 3/7 >> vancomycin >> 3/10 3/7 >> Zosyn >> 3/10 3/10 >> Augmentin >>  3/12 >> Vancomycin >>  Tmax: remains afebile WBCs: returned to wnl (solumedrol >> prednisone) Renal: SCr 0.48 - stable, CrCl  using SCr 0.8 (note new weight 88 lbs down from   2/24 blood x2: NGF 3/9 blood x2: NGTD  2/24 flu panel: + influenza A 2/24 resp virus panel: + influenza A 2/24 S pneumo Ur Ag: negative 2/24 Legionella Ur Ag: negative 2/25 trach aspirate: NGF 3/1 pleural fluid: NGF 2/24 MRSA PCR: negative   Goal of Therapy:   Vancomycin trough level 15-20 mcg/ml (endocarditis?)  Eradication of infection  Appropriate antibiotic dosing for indication and renal function  Plan:   Resume vancomycin 1g IV q24h  Continue Augmentin as ordered  Measure vancomycin trough levels at steady state as indicated  Follow clinical course, culture results as available, renal function  Follow for de-escalation of antibiotics and LOT   Reuel Boom, PharmD Pager: [phone] 07/08/2014, 10:13 AM

## 2014-07-08 NOTE — Progress Notes (Signed)
PULMONARY / CRITICAL CARE MEDICINE   Name: Elaine Le MRN: [id number] DOB: 19-May-1960    ADMISSION DATE:  06/21/2014 CONSULTATION DATE:  07/08/2014  REFERRING MD :  EDP  CHIEF COMPLAINT:  SOB  INITIAL PRESENTATION:  61 yowf active smoker,  admitted 2/24 with a 3 day hx of cough and SOB.  On arrival was found to have significant respiratory distress and hypoxia. Patient failed bipap and was intubated with a #6.5 ETT in ER, difficult intubation.   STUDIES:  CTA chest 2/24 >>> no PE, underlying emphysema.  Bilateral PNA, right hilar and mediastinal reactive lymphadenopathy. Echo 3/8 >>> Normal systolic function. EF 60-65%. Anterior mitral valve leaflet with large shaggy mobile density on the LV side of the leaflet appreciated only in apical 4 and 2 chamber views. Unclear if patient has underlying rheumatic mitral stenosis and now possible endocarditis. This needs to be evaluated further with TEE. Moderate diffuse thickening of the anterior leaflet consistent with moderate stenosis.   Culture data/abx  Abx: Ceftriaxone, start date 2/24, completed 7d Abx: Azithromycin, start date 2/24, completed 7d Resp virus panel >  POS INFLU A  Antiviral Tamiflu 2/25 >> 2/29 Vancomycin 3/7 >> 3/9   Zosyn 3/7> 3/9  Blood cultures x 2 3/10>>> Augmetin 3/10 >>> Vanc (? mrsa on R hand)> 3/12   SIGNIFICANT EVENTS: 2/24  Admit, intubated; left IJ CVL placed  2/25  Levo @ 10 mcg, sedate on vent.  Family concerned she has an 'allergy to albuterol'.   2/26  Weaned to 4 mcg levo, awake on vent, no acute events overnight, aspiration? 2/27  weaning levophed 3/1    right thoracentesis -->transudate  3/2   Extubated. Reintubated for increased work of breathing. Levophed restarted overnight.  3/4 extubated 3/7 on BIPAP on and off since extubation. Work of breathing worse. ABX widened for HCAP 3/8 remarkably better 3/9 asked Cards to see re: ECHO findings. Plan for TEE.  3/10: problems now resolved  over ICU stay. Acute encephalopathy; hypernatremia, septic shock; Bilateral R>L pleural effusions -->right transudate by light's criteria. Transferred to tele. CVL removed. Changed abx to augmentin   SUBJECTIVE:   Very congested cough/rattling .   VITAL SIGNS: Temp:  [98.2 F (36.8 C)-99.5 F (37.5 C)] 98.2 F (36.8 C) (03/12 0636) Pulse Rate:  [82-84] 84 (03/12 0636) Resp:  [16-18] 16 (03/12 0636) BP: (98-109)/(53-59) 98/57 mmHg (03/12 0636) SpO2:  [93 %-99 %] 93 % (03/12 0813) Weight:  [88 lb 11.2 oz (40.234 kg)] 88 lb 11.2 oz (40.234 kg) (03/12 0636)     Room air   INTAKE / OUTPUT: Intake/Output      03/11 0701 - 03/12 0700 03/12 0701 - 03/13 0700   P.O. 1080    I.V. (mL/kg)     Total Intake(mL/kg) 1080 (26.8)    Urine (mL/kg/hr)     Total Output       Net +1080            PHYSICAL EXAMINATION:  Gen: thin woman, awake,  Sitting on side of bed nad HEENT: NCAT. Mucous membranes moist.  PULM: junky insp and exp rhonchi bilaterally  CV: RRR, no MGR  Ab: BS+, soft, nontender Ext: notable edema in bilateral hands  Neuro: awake, follows commands, equal strength bilaterally Skin :  Two painful nodules R finger  and forearm   LABS: CBC  Recent Labs Lab 07/04/14 0530 07/06/14 0530  HGB 11.8* 11.7*  HCT 37.0 36.0  WBC 12.1* 9.1  PLT 445* 409*  CHEMISTRY  Recent Labs Lab 07/03/14 0601 07/04/14 0530 07/05/14 0513 07/06/14 0530 07/07/14 0525  NA 148* 139 135 136 140  K 3.2* 3.1* 3.3* 3.4* 3.8  CL 112 98 93* 98 102  CO2 30 35* 32 32 31  GLUCOSE 103* 127* 115* 113* 104*  BUN 20 20 15 11 12   CREATININE 0.40* 0.54 0.48* 0.57 0.45*  CALCIUM 8.4 8.4 8.3* 8.5 8.7  MG  --   --  2.3  --   --    Estimated Creatinine Clearance: 51 mL/min (by C-G formula based on Cr of 0.45).  Recent Labs  07/05/14 2352 07/06/14 0415 07/06/14 0732  GLUCAP 98 113* 115*    IMAGING x48h No results found.aeration worse  ASSESSMENT / PLAN:  Acute Hypoxic Respiratory failure  - in setting of AECOPD + Flu A + CAP; and pulmonary edema  2/28 Tobacco Abuse - smoker, no baseline PFT's Plan:   Taper steroids  Continue   symbicort Aim for even to slightly neg volume status Continue oral lasix w/ KCL supp  Continue nicotine patch  Flutter valve - reordered 3/12  Changed abx to augmentin, complete 8d total rx.  Set up f/u with [facility] on discharge for PFT's and management of presumed COPD.  Shaggy mobile density on MV (LV side) new finding on ECHO 3/8. ? endocarditis Plan:  TEE to further evaluate possible mitral vegetation plan for  3/14 - lesions on R hand could be MRSA > restart vanc 3/12   Dysphagia  Plan:   Stress ulcer prophylaxis - Pantoprazole> renewed 3/12 as severe coughing  Bowel regimen - senna Continue diet per SLEP recommendation.   Deconditioning s/p critical illness  Plan:   PT/OT - advance activity as tolerated    Mother and father at bedside 3/12    Christinia Gully, MD Pulmonary and [place] [phone] After 5:30 PM or weekends, call [phone]

## 2014-07-09 MED ORDER — CLOTRIMAZOLE 2 % VA CREA
1.0000 | TOPICAL_CREAM | Freq: Every day | VAGINAL | Status: DC
  Filled 2014-07-09: qty 21

## 2014-07-09 MED ORDER — CLOTRIMAZOLE 1 % VA CREA
1.0000 | TOPICAL_CREAM | Freq: Every day | VAGINAL | Status: DC
  Administered 2014-07-09 – 2014-07-10 (×2): 1 via VAGINAL
  Filled 2014-07-09 (×2): qty 45

## 2014-07-09 MED ORDER — CHLORHEXIDINE GLUCONATE CLOTH 2 % EX PADS: 6.0000 | MEDICATED_PAD | Freq: Every day | CUTANEOUS | Status: DC

## 2014-07-09 MED ORDER — MUPIROCIN 2 % EX OINT: 1.0000 "application " | TOPICAL_OINTMENT | Freq: Two times a day (BID) | CUTANEOUS | Status: DC

## 2014-07-09 NOTE — Progress Notes (Signed)
PULMONARY / CRITICAL CARE MEDICINE   Name: Elaine Le MRN: [id number] DOB: Jun 11, 1960    ADMISSION DATE:  06/21/2014 CONSULTATION DATE:   06/21/14     REFERRING MD :  EDP  CHIEF COMPLAINT:  SOB  INITIAL PRESENTATION:  42 yowf active smoker,  admitted 2/24 with a 3 day hx of cough and SOB.  On arrival was found to have significant respiratory distress and hypoxia. Patient failed bipap and was intubated with a #6.5 ETT in ER, difficult intubation.   STUDIES:  CTA chest 2/24 >>> no PE, underlying emphysema.  Bilateral PNA, right hilar and mediastinal reactive lymphadenopathy. Echo 3/8 >>> Normal systolic function. EF 60-65%. Anterior mitral valve leaflet with large shaggy mobile density on the LV side of the leaflet appreciated only in apical 4 and 2 chamber views. Unclear if patient has underlying rheumatic mitral stenosis and now possible endocarditis. This needs to be evaluated further with TEE. Moderate diffuse thickening of the anterior leaflet consistent with moderate stenosis.   Culture data/abx BC x 2  2/24 > neg  MRSA screen 2/24 > neg Abx: Ceftriaxone, start date 2/24, completed 7d Abx: Azithromycin, start date 2/24, completed 7d Resp virus panel >  POS INFLU A  Antiviral Tamiflu 2/25 >> 2/29 Vancomycin 3/7 >> 3/9   Zosyn 3/7> 3/9  Blood cultures x 2 3/9>>> Augmetin 3/10 >>> Vanc (? mrsa on R hand)  3/12>>>   SIGNIFICANT EVENTS: 2/24  Admit, intubated; left IJ CVL placed  2/25  Levo @ 10 mcg, sedate on vent.  Family concerned she has an 'allergy to albuterol'.   2/26  Weaned to 4 mcg levo, awake on vent, no acute events overnight, aspiration? 2/27  weaning levophed 3/1    right thoracentesis -->transudate  3/2   Extubated. Reintubated for increased work of breathing. Levophed restarted overnight.  3/4 extubated 3/7 on BIPAP on and off since extubation. Work of breathing worse. ABX widened for HCAP 3/8 remarkably better 3/9 asked Cards to see re: ECHO findings.  Plan for TEE.  3/10: problems now resolved over ICU stay. Acute encephalopathy; hypernatremia, septic shock; Bilateral R>L pleural effusions -->right transudate by light's criteria. Transferred to tele. CVL removed. Changed abx to augmentin   SUBJECTIVE:   Much less congested cough/rattling .   VITAL SIGNS: Temp:  [98.3 F (36.8 C)-98.9 F (37.2 C)] 98.3 F (36.8 C) (03/13 0500) Pulse Rate:  [83-89] 89 (03/13 0500) Resp:  [18-20] 18 (03/13 0500) BP: (106-112)/(51-61) 111/56 mmHg (03/13 0500) SpO2:  [94 %-99 %] 97 % (03/13 0813) Weight:  [95 lb 6.4 oz (43.273 kg)] 95 lb 6.4 oz (43.273 kg) (03/13 0630)     Room air   INTAKE / OUTPUT: Intake/Output      03/12 0701 - 03/13 0700 03/13 0701 - 03/14 0700   P.O. 720    IV Piggyback 200    Total Intake(mL/kg) 920 (21.3)    Net +920          Urine Occurrence 8 x      PHYSICAL EXAMINATION:  Gen: thin woman, awake,  Sitting in chair  HEENT: NCAT. Mucous membranes moist.  PULM: min  insp and exp rhonchi bilaterally  CV: RRR, no MGR  Ab: BS+, soft, nontender Ext: notable edema in bilateral hands  Neuro: awake, follows commands, equal strength bilaterally Skin :  Two painful nodules R finger  and forearm no change since 3/12  LABS: CBC  Recent Labs Lab 07/04/14 0530 07/06/14 0530  HGB 11.8*  11.7*  HCT 37.0 36.0  WBC 12.1* 9.1  PLT 445* 409*   CHEMISTRY  Recent Labs Lab 07/03/14 0601 07/04/14 0530 07/05/14 0513 07/06/14 0530 07/07/14 0525  NA 148* 139 135 136 140  K 3.2* 3.1* 3.3* 3.4* 3.8  CL 112 98 93* 98 102  CO2 30 35* 32 32 31  GLUCOSE 103* 127* 115* 113* 104*  BUN 20 20 15 11 12   CREATININE 0.40* 0.54 0.48* 0.57 0.45*  CALCIUM 8.4 8.4 8.3* 8.5 8.7  MG  --   --  2.3  --   --    Estimated Creatinine Clearance: 55 mL/min (by C-G formula based on Cr of 0.45). No results for input(s): GLUCAP in the last 72 hours.  IMAGING x48h Dg Chest 2 View  07/08/2014   CLINICAL DATA:  54 year old female with a history  of pneumonia and cough. Current smoker.  EXAM: CHEST - 2 VIEW  COMPARISON:  07/04/2014, 07/03/2014, 07/02/2014, 06/30/2014  FINDINGS: Cardiomediastinal silhouette unchanged in size and contour.  Continued improvement of bilateral airspace and interstitial opacity. Minimal interstitial opacities persist with no pneumothorax. Small pleural effusions likely persist.  No pulmonary vascular congestion.  No displaced fracture.  Unremarkable appearance of the upper abdomen.  IMPRESSION: Near complete resolution of previous airspace and interstitial disease. Small persisting pleural effusions.  Interval removal of left IJ central catheter.  Signed,  Dulcy Fanny. [name], DO  [facility]  [facility]  Signed,  Dulcy Fanny. [name], DO  [facility]  [facility]   Electronically Signed   By: Corrie Mckusick D.O.   On: 07/08/2014 12:16    ASSESSMENT / PLAN:  Acute Hypoxic Respiratory failure - in setting of AECOPD + Flu A + CAP; and pulmonary edema  2/28 Tobacco Use smoker, no baseline PFT's Plan:   Tapering  steroids  Continue   symbicort Continue oral lasix w/ KCL supp Continue nicotine patch  Flutter valve - reordered 3/12  Changed abx to augmentin, complete 8d total on 3/18  Set up f/u with [facility] on discharge for PFT's and management of presumed COPD.  Shaggy mobile density on MV (LV side) new finding on ECHO 3/8. ? endocarditis Plan:  TEE to further evaluate possible mitral vegetation plan for  3/14 - lesions on R hand could be MRSA though all cultures neg to date> restarted  vanc 3/12    Dysphagia  Plan:   Stress ulcer prophylaxis - Pantoprazole> renewed 3/12 as severe coughing  Bowel regimen - senna Continue diet per SLEP recommendation.   Deconditioning s/p critical illness  Plan:   PT/OT - advance activity as tolerated        Christinia Gully, MD Pulmonary and [place] [phone] After 5:30 PM or weekends, call [phone]

## 2014-07-09 NOTE — Progress Notes (Signed)
Patient's HR decreased to 30s. Vitals stable. PCP on call notified.

## 2014-07-10 ENCOUNTER — Encounter (HOSPITAL_COMMUNITY)
Admission: EM | Disposition: A | Discharge status: Home / Self Care | Payer: Self-pay | Source: Home / Self Care | Attending: Pulmonary Disease

## 2014-07-10 ENCOUNTER — Ambulatory Visit (HOSPITAL_COMMUNITY): Admit: 2014-07-10 | Payer: Self-pay | Admitting: Cardiology

## 2014-07-10 ENCOUNTER — Encounter (HOSPITAL_COMMUNITY): Payer: Self-pay | Admitting: *Deleted

## 2014-07-10 DIAGNOSIS — I059 Rheumatic mitral valve disease, unspecified: Secondary | ICD-10-CM

## 2014-07-10 DIAGNOSIS — I340 Nonrheumatic mitral (valve) insufficiency: Secondary | ICD-10-CM

## 2014-07-10 HISTORY — DX: Rheumatic mitral valve disease, unspecified: I05.9

## 2014-07-10 HISTORY — PX: TEE WITHOUT CARDIOVERSION: SHX5443

## 2014-07-10 LAB — BASIC METABOLIC PANEL
ANION GAP: 9 (ref 5–15)
Anion gap: 8 (ref 5–15)
BUN: 11 mg/dL (ref 6–23)
BUN: 10 mg/dL (ref 6–23)
CALCIUM: 8.9 mg/dL (ref 8.4–10.5)
CHLORIDE: 104 mmol/L (ref 96–112)
CO2: 28 mmol/L (ref 19–32)
CO2: 28 mmol/L (ref 19–32)
Calcium: 9 mg/dL (ref 8.4–10.5)
Chloride: 105 mmol/L (ref 96–112)
Creatinine, Ser: 0.5 mg/dL (ref 0.50–1.10)
Creatinine, Ser: 0.49 mg/dL — ABNORMAL LOW (ref 0.50–1.10)
GFR calc Af Amer: >90 mL/min (ref >90)
GFR calc non Af Amer: >90 mL/min (ref >90)
GFR calc non Af Amer: >90 mL/min (ref >90)
GLUCOSE: 105 mg/dL — AB (ref 70–99)
Glucose, Bld: 91 mg/dL (ref 70–99)
POTASSIUM: 3.9 mmol/L (ref 3.5–5.1)
POTASSIUM: 4.1 mmol/L (ref 3.5–5.1)
Sodium: 140 mmol/L (ref 135–145)
Sodium: 142 mmol/L (ref 135–145)

## 2014-07-10 LAB — CBC
HCT: 33.6 % — ABNORMAL LOW (ref 36.0–46.0)
HEMOGLOBIN: 11.2 g/dL — AB (ref 12.0–15.0)
MCH: 30.9 pg (ref 26.0–34.0)
MCHC: 33.3 g/dL (ref 30.0–36.0)
MCV: 92.8 fL (ref 78.0–100.0)
Platelets: 396 10*3/uL (ref 150–400)
RBC: 3.62 MIL/uL — ABNORMAL LOW (ref 3.87–5.11)
RDW: 15.3 % (ref 11.5–15.5)
WBC: 11.1 10*3/uL — ABNORMAL HIGH (ref 4.0–10.5)

## 2014-07-10 LAB — PROTIME-INR
INR: 0.98 (ref 0.00–1.49)
Prothrombin Time: 13.1 seconds (ref 11.6–15.2)

## 2014-07-10 SURGERY — ECHOCARDIOGRAM, TRANSESOPHAGEAL
Anesthesia: Moderate Sedation

## 2014-07-10 MED ORDER — BUTAMBEN-TETRACAINE-BENZOCAINE 2-2-14 % EX AERO
INHALATION_SPRAY | CUTANEOUS | Status: DC | PRN
  Administered 2014-07-10: 2 via TOPICAL

## 2014-07-10 MED ORDER — SODIUM CHLORIDE 0.9 % IV SOLN
INTRAVENOUS | Status: DC
  Administered 2014-07-10: 500 mL via INTRAVENOUS

## 2014-07-10 MED ORDER — IPRATROPIUM-ALBUTEROL 0.5-2.5 (3) MG/3ML IN SOLN
3.0000 mL | Freq: Four times a day (QID) | RESPIRATORY_TRACT | Status: DC
  Administered 2014-07-10 – 2014-07-11 (×3): 3 mL via RESPIRATORY_TRACT
  Filled 2014-07-10 (×3): qty 3

## 2014-07-10 MED ORDER — MIDAZOLAM HCL 10 MG/2ML IJ SOLN
INTRAMUSCULAR | Status: DC | PRN
  Administered 2014-07-10: 2 mg via INTRAVENOUS
  Administered 2014-07-10: 1 mg via INTRAVENOUS

## 2014-07-10 MED ORDER — FENTANYL CITRATE 0.05 MG/ML IJ SOLN
INTRAMUSCULAR | Status: DC | PRN
  Administered 2014-07-10: 25 ug via INTRAVENOUS

## 2014-07-10 NOTE — H&P (View-Only) (Signed)
PULMONARY / CRITICAL CARE MEDICINE   Name: Elaine Le MRN: [id number] DOB: [birth year]    ADMISSION DATE:  06/21/2014 CONSULTATION DATE:   06/21/14     REFERRING MD :  EDP  CHIEF COMPLAINT:  SOB  INITIAL PRESENTATION:  54 yowf active smoker,  admitted 2/24 with a 3 day hx of cough and SOB.  On arrival was found to have significant respiratory distress and hypoxia. Patient failed bipap and was intubated with a #6.5 ETT in ER, difficult intubation.   STUDIES:  CTA chest 2/24 >>> no PE, underlying emphysema.  Bilateral PNA, right hilar and mediastinal reactive lymphadenopathy. Echo 3/8 >>> Normal systolic function. EF 60-65%. Anterior mitral valve leaflet with large shaggy mobile density on the LV side of the leaflet appreciated only in apical 4 and 2 chamber views. Unclear if patient has underlying rheumatic mitral stenosis and now possible endocarditis. This needs to be evaluated further with TEE. Moderate diffuse thickening of the anterior leaflet consistent with moderate stenosis.   Culture data/abx BC x 2  2/24 > neg  MRSA screen 2/24 > neg Abx: Ceftriaxone, start date 2/24, completed 7d Abx: Azithromycin, start date 2/24, completed 7d Resp virus panel >  POS INFLU A  Antiviral Tamiflu 2/25 >> 2/29 Vancomycin 3/7 >> 3/9   Zosyn 3/7> 3/9  Blood cultures x 2 3/9>>> Augmetin 3/10 >>> Vanc (? mrsa on R hand)  3/12>>>   SIGNIFICANT EVENTS: 2/24  Admit, intubated; left IJ CVL placed  2/25  Levo @ 10 mcg, sedate on vent.  Family concerned she has an 'allergy to albuterol'.   2/26  Weaned to 4 mcg levo, awake on vent, no acute events overnight, aspiration? 2/27  weaning levophed 3/1    right thoracentesis -->transudate  3/2   Extubated. Reintubated for increased work of breathing. Levophed restarted overnight.  3/4 extubated 3/7 on BIPAP on and off since extubation. Work of breathing worse. ABX widened for HCAP 3/8 remarkably better 3/9 asked Cards to see re: ECHO findings.  Plan for TEE.  3/10: problems now resolved over ICU stay. Acute encephalopathy; hypernatremia, septic shock; Bilateral R>L pleural effusions -->right transudate by light's criteria. Transferred to tele. CVL removed. Changed abx to augmentin   SUBJECTIVE:   Much less congested cough/rattling .   VITAL SIGNS: Temp:  [98.3 F (36.8 C)-98.9 F (37.2 C)] 98.3 F (36.8 C) (03/13 0500) Pulse Rate:  [83-89] 89 (03/13 0500) Resp:  [18-20] 18 (03/13 0500) BP: (106-112)/(51-61) 111/56 mmHg (03/13 0500) SpO2:  [94 %-99 %] 97 % (03/13 0813) Weight:  [95 lb 6.4 oz (43.273 kg)] 95 lb 6.4 oz (43.273 kg) (03/13 0630)     Room air   INTAKE / OUTPUT: Intake/Output      03/12 0701 - 03/13 0700 03/13 0701 - 03/14 0700   P.O. 720    IV Piggyback 200    Total Intake(mL/kg) 920 (21.3)    Net +920          Urine Occurrence 8 x      PHYSICAL EXAMINATION:  Gen: thin woman, awake,  Sitting in chair  HEENT: NCAT. Mucous membranes moist.  PULM: min  insp and exp rhonchi bilaterally  CV: RRR, no MGR  Ab: BS+, soft, nontender Ext: notable edema in bilateral hands  Neuro: awake, follows commands, equal strength bilaterally Skin :  Two painful nodules R finger  and forearm no change since 3/12  LABS: CBC  Recent Labs Lab 07/04/14 0530 07/06/14 0530  HGB 11.8*  11.7*  HCT 37.0 36.0  WBC 12.1* 9.1  PLT 445* 409*   CHEMISTRY  Recent Labs Lab 07/03/14 0601 07/04/14 0530 07/05/14 0513 07/06/14 0530 07/07/14 0525  NA 148* 139 135 136 140  K 3.2* 3.1* 3.3* 3.4* 3.8  CL 112 98 93* 98 102  CO2 30 35* 32 32 31  GLUCOSE 103* 127* 115* 113* 104*  BUN 20 20 15 11 12   CREATININE 0.40* 0.54 0.48* 0.57 0.45*  CALCIUM 8.4 8.4 8.3* 8.5 8.7  MG  --   --  2.3  --   --    Estimated Creatinine Clearance: 55 mL/min (by C-G formula based on Cr of 0.45). No results for input(s): GLUCAP in the last 72 hours.  IMAGING x48h Dg Chest 2 View  07/08/2014   CLINICAL DATA:  54 year old female with a history  of pneumonia and cough. Current smoker.  EXAM: CHEST - 2 VIEW  COMPARISON:  07/04/2014, 07/03/2014, 07/02/2014, 06/30/2014  FINDINGS: Cardiomediastinal silhouette unchanged in size and contour.  Continued improvement of bilateral airspace and interstitial opacity. Minimal interstitial opacities persist with no pneumothorax. Small pleural effusions likely persist.  No pulmonary vascular congestion.  No displaced fracture.  Unremarkable appearance of the upper abdomen.  IMPRESSION: Near complete resolution of previous airspace and interstitial disease. Small persisting pleural effusions.  Interval removal of left IJ central catheter.  Signed,  Dulcy Fanny. [name], DO  [facility]  [facility]  Signed,  Dulcy Fanny. [name], DO  [facility]  [facility] Radiology   Electronically Signed   By: Corrie Mckusick D.O.   On: 07/08/2014 12:16    ASSESSMENT / PLAN:  Acute Hypoxic Respiratory failure - in setting of AECOPD + Flu A + CAP; and pulmonary edema  2/28 Tobacco Use smoker, no baseline PFT's Plan:   Tapering  steroids  Continue   symbicort Continue oral lasix w/ KCL supp Continue nicotine patch  Flutter valve - reordered 3/12  Changed abx to augmentin, complete 8d total on 3/18  Set up f/u with [facility] on discharge for PFT's and management of presumed COPD.  Shaggy mobile density on MV (LV side) new finding on ECHO 3/8. ? endocarditis Plan:  TEE to further evaluate possible mitral vegetation plan for  3/14 - lesions on R hand could be MRSA though all cultures neg to date> restarted  vanc 3/12    Dysphagia  Plan:   Stress ulcer prophylaxis - Pantoprazole> renewed 3/12 as severe coughing  Bowel regimen - senna Continue diet per SLEP recommendation.   Deconditioning s/p critical illness  Plan:   PT/OT - advance activity as tolerated        Christinia Gully, MD Pulmonary and [place] [phone] After 5:30 PM or weekends, call [phone]

## 2014-07-10 NOTE — CV Procedure (Signed)
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 Kirk Ruths

## 2014-07-10 NOTE — Interval H&P Note (Signed)
History and Physical Interval Note:  07/10/2014 10:36 AM  Elaine Le  has presented today for surgery, with the diagnosis of POSITIVE BLOOD CULTRUES  The various methods of treatment have been discussed with the patient and family. After consideration of risks, benefits and other options for treatment, the patient has consented to  Procedure(s): TRANSESOPHAGEAL ECHOCARDIOGRAM (TEE) (N/A) as a surgical intervention .  The patient's history has been reviewed, patient examined, no change in status, stable for surgery.  I have reviewed the patient's chart and labs.  Questions were answered to the patient's satisfaction.     Kirk Ruths

## 2014-07-10 NOTE — Progress Notes (Signed)
  Echocardiogram Echocardiogram Transesophageal has been performed.  Philipp Deputy 07/10/2014, 11:53 AM

## 2014-07-10 NOTE — Progress Notes (Signed)
PULMONARY / CRITICAL CARE MEDICINE   Name: Elaine Le MRN: [id number] DOB: [birth year]    ADMISSION DATE:  06/21/2014 CONSULTATION DATE:   06/21/14     REFERRING MD :  EDP  CHIEF COMPLAINT:  SOB  INITIAL PRESENTATION:   54 yo smoker presented with cough, dyspnea, respiratory distress, and hypoxia from AECOPD.  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 Reintubated for increased work of breathing. Levophed restarted overnight.  3/04  extubated 3/07 on BIPAP on and off since extubation. Work of breathing worse. ABX widened for HCAP 3/08 remarkably better 3/09 asked Cards to see re: ECHO findings. Plan for TEE.  3/10 transfer to floor  SUBJECTIVE:   Just returned from TEE  VITAL SIGNS: Temp:  [98.5 F (36.9 C)-98.7 F (37.1 C)] 98.5 F (36.9 C) (03/14 0559) Pulse Rate:  [72-96] 96 (03/14 0559) Resp:  [18-20] 18 (03/14 0559) BP: (116-117)/(57-62) 116/57 mmHg (03/14 0559) SpO2:  [98 %-100 %] 100 % (03/14 0559) Weight:  [95 lb 1.6 oz (43.137 kg)] 95 lb 1.6 oz (43.137 kg) (03/14 0559)     INTAKE / OUTPUT: Intake/Output      03/13 0701 - 03/14 0700 03/14 0701 - 03/15 0700   P.O. 120    IV Piggyback     Total Intake(mL/kg) 120 (2.8)    Net +120          Urine Occurrence 2 x    Stool Occurrence 1 x      PHYSICAL EXAMINATION:  Gen: thin woman, awake,  Sitting in chair , just back from TEE HEENT: NCAT. Mucous membranes moist. No jvd PULM: No distress, ++rhonchi bilat CV: RRR, no MGR  Ab: BS+, soft, nontender Ext: notable edema in bilateral hands  Neuro: awake, follows commands, equal strength bilaterally Skin :  Two painful nodules R finger  and forearm no change since  3/12  LABS: CBC  Recent Labs Lab 07/04/14 0530 07/06/14 0530 07/10/14 0545  HGB 11.8* 11.7* 11.2*  HCT 37.0 36.0 33.6*  WBC 12.1* 9.1 11.1*  PLT 445* 409* 396   CHEMISTRY  Recent Labs Lab 07/04/14 0530 07/05/14 0513 07/06/14 0530 07/07/14 0525 07/10/14 0545  NA 139 135 136 140 140  K 3.1* 3.3* 3.4* 3.8 3.9  CL 98 93* 98 102 104  CO2 35* 32 32 31 28  GLUCOSE 127* 115* 113* 104* 105*  BUN 20 15 11 12 11   CREATININE 0.54 0.48* 0.57 0.45* 0.50  CALCIUM 8.4 8.3* 8.5 8.7 9.0  MG  --  2.3  --   --   --    Estimated Creatinine Clearance: 54.7 mL/min (by C-G formula based on Cr of 0.5). No results for input(s): GLUCAP in the last 72 hours.  IMAGING x48h Dg Chest 2 View  07/08/2014   CLINICAL DATA:  54 year old female with a history of pneumonia and cough. Current smoker.  EXAM: CHEST - 2 VIEW  COMPARISON:  07/04/2014, 07/03/2014, 07/02/2014, 06/30/2014  FINDINGS: Cardiomediastinal silhouette unchanged in size and contour.  Continued improvement of bilateral airspace and interstitial opacity. Minimal interstitial opacities persist with no pneumothorax. Small pleural effusions likely persist.  No pulmonary vascular congestion.  No displaced  fracture.  Unremarkable appearance of the upper abdomen.  IMPRESSION: Near complete resolution of previous airspace and interstitial disease. Small persisting pleural effusions.  Interval removal of left IJ central catheter.  Signed,  Dulcy Fanny. [name], DO  [facility]  [facility] Radiology  Signed,  Dulcy Fanny. [name], DO  [facility]  [facility] Radiology   Electronically Signed   By: Corrie Mckusick D.O.   On: 07/08/2014 12:16    ASSESSMENT / PLAN:  Respiratory viral panel 2/25 >> Influenza A Blood 3/09 >>  Acute Hypoxic Respiratory failure 2nd to AECOPD, PNA, Influenza A, and pulmonary edema. Tobacco abuse. Plan:   Continue symbicort D/c nicotine patch Wean off  prednisone as tolerated Continue lasix Bronchial hygiene Plan d/c augmentin 3/18  Will need outpt PFT's  Mobile density on mitral valve on TTE >> TEE 3/14 consistent with mod MS/severe MR from rheumatic heart disease; no vegetation. Plan:  D/c vancomycin 3/14 F/u with cardiology  Dysphagia >> improved. Plan:   Regular diet   GERD. Plan: Protonix  Hx of IBS, Fibromyalgia, HA, chronic back pain. Plan: PRN analgesics  Updated family at bedside.  Monitor for untoward after effects from sedation during TEE >> if stable, then possible d/c home 3/15.  Chesley Mires, MD [facility] Pulmonary/Critical Care 07/10/2014, 1:04 PM Pager:  [phone] After 3pm call: [phone]

## 2014-07-10 NOTE — Progress Notes (Signed)
PT Cancellation Note  ___Treatment cancelled today due to medical issues with patient which prohibited therapy  _X_ Treatment cancelled today due to patient receiving procedure or test                 TEE  ___ Treatment cancelled today due to patient's refusal to participate   ___ Treatment cancelled today due to   Rica Koyanagi  PTA [facility]  Acute  Rehab Pager      [phone]

## 2014-07-11 ENCOUNTER — Encounter (HOSPITAL_COMMUNITY): Payer: Self-pay | Admitting: Cardiology

## 2014-07-11 DIAGNOSIS — I050 Rheumatic mitral stenosis: Secondary | ICD-10-CM

## 2014-07-11 DIAGNOSIS — I340 Nonrheumatic mitral (valve) insufficiency: Secondary | ICD-10-CM | POA: Diagnosis present

## 2014-07-11 HISTORY — DX: Rheumatic mitral stenosis: I05.0

## 2014-07-11 LAB — CULTURE, BLOOD (ROUTINE X 2)
CULTURE: NO GROWTH
Culture: NO GROWTH

## 2014-07-11 MED ORDER — POLYETHYLENE GLYCOL 3350 17 GM/SCOOP PO POWD: 1.0000 | Freq: Every day | ORAL | Status: DC | PRN

## 2014-07-11 MED ORDER — NICOTINE 7 MG/24HR TD PT24
7.0000 mg | MEDICATED_PATCH | Freq: Every day | TRANSDERMAL | Status: DC
  Administered 2014-07-11: 7 mg via TRANSDERMAL
  Filled 2014-07-11: qty 1

## 2014-07-11 MED ORDER — NICOTINE 7 MG/24HR TD PT24: 7.0000 mg | MEDICATED_PATCH | Freq: Every day | TRANSDERMAL | Status: DC

## 2014-07-11 MED ORDER — BUDESONIDE-FORMOTEROL FUMARATE 160-4.5 MCG/ACT IN AERO: 2.0000 | INHALATION_SPRAY | Freq: Two times a day (BID) | RESPIRATORY_TRACT | Status: DC

## 2014-07-11 MED ORDER — AMOXICILLIN-POT CLAVULANATE 875-125 MG PO TABS: 1.0000 | ORAL_TABLET | Freq: Two times a day (BID) | ORAL | Status: DC

## 2014-07-11 MED ORDER — PREDNISONE 10 MG PO TABS: ORAL_TABLET | ORAL | Status: DC

## 2014-07-11 NOTE — Consult Note (Signed)
SUBJECTIVE:  No complaints  OBJECTIVE:   Vitals:   Filed Vitals:   07/10/14 1835 07/10/14 2059 07/11/14 0531 07/11/14 0749  BP: 93/46 114/66 105/60   Pulse: 85 90 95   Temp: 98.6 F (37 C) 98 F (36.7 C) 98.4 F (36.9 C)   TempSrc: Oral Oral Oral   Resp: 20 20 18    Height:      Weight:   94 lb 4.8 oz (42.774 kg)   SpO2: 100% 100% 98% 98%   I&O's:   Intake/Output Summary (Last 24 hours) at 07/11/14 3614 Last data filed at 07/10/14 1300  Gross per 24 hour  Intake    640 ml  Output      0 ml  Net    640 ml     PHYSICAL EXAM General: Well developed, well nourished, in no acute distress Head: Eyes PERRLA, No xanthomas.   Normal cephalic and atramatic  Lungs:   Clear bilaterally to auscultation and percussion. Heart:   HRRR S1 S2 Pulses are 2+ & equal. 2/6 systolic murmur at LLSB to apex. Occasional ectopy Abdomen: Bowel sounds are positive, abdomen soft and non-tender without masses  Extremities:   No clubbing, cyanosis or edema.  DP +1 Neuro: Alert and oriented X 3. Psych:  Good affect, responds appropriately   LABS: Basic Metabolic Panel:  Recent Labs  07/10/14 0545 07/10/14 1200  NA 140 142  K 3.9 4.1  CL 104 105  CO2 28 28  GLUCOSE 105* 91  BUN 11 10  CREATININE 0.50 0.49*  CALCIUM 9.0 8.9   Liver Function Tests: No results for input(s): AST, ALT, ALKPHOS, BILITOT, PROT, ALBUMIN in the last 72 hours. No results for input(s): LIPASE, AMYLASE in the last 72 hours. CBC:  Recent Labs  07/10/14 0545  WBC 11.1*  HGB 11.2*  HCT 33.6*  MCV 92.8  PLT 396   Cardiac Enzymes: No results for input(s): CKTOTAL, CKMB, CKMBINDEX, TROPONINI in the last 72 hours. BNP: Invalid input(s): POCBNP D-Dimer: No results for input(s): DDIMER in the last 72 hours. Hemoglobin A1C: No results for input(s): HGBA1C in the last 72 hours. Fasting Lipid Panel: No results for input(s): CHOL, HDL, LDLCALC, TRIG, CHOLHDL, LDLDIRECT in the last 72 hours. Thyroid Function  Tests: No results for input(s): TSH, T4TOTAL, T3FREE, THYROIDAB in the last 72 hours.  Invalid input(s): FREET3 Anemia Panel: No results for input(s): VITAMINB12, FOLATE, FERRITIN, TIBC, IRON, RETICCTPCT in the last 72 hours. Coag Panel:   Lab Results  Component Value Date   INR 0.98 07/10/2014    RADIOLOGY: Dg Neck Soft Tissue  06/21/2014   CLINICAL DATA:  Neck swelling, increased since central line placement.  EXAM: NECK SOFT TISSUES - 1+ VIEW  COMPARISON:  None.  FINDINGS: NG and endotracheal tubes are in place. Left central venous catheter. Suggestion of increased density in the soft tissues of the left neck. This is nonspecific in could be due to patient positioning, soft tissue attenuation, or hematoma. Clinical correlation recommended.  IMPRESSION: Increased density in the left neck of nonspecific etiology. Soft tissue hematoma versus soft tissue attenuation due to positioning.   Electronically Signed   By: Lucienne Capers M.D.   On: 06/21/2014 21:49   Dg Chest 2 View  07/08/2014   CLINICAL DATA:  54 year old female with a history of pneumonia and cough. Current smoker.  EXAM: CHEST - 2 VIEW  COMPARISON:  07/04/2014, 07/03/2014, 07/02/2014, 06/30/2014  FINDINGS: Cardiomediastinal silhouette unchanged in size and contour.  Continued  improvement of bilateral airspace and interstitial opacity. Minimal interstitial opacities persist with no pneumothorax. Small pleural effusions likely persist.  No pulmonary vascular congestion.  No displaced fracture.  Unremarkable appearance of the upper abdomen.  IMPRESSION: Near complete resolution of previous airspace and interstitial disease. Small persisting pleural effusions.  Interval removal of left IJ central catheter.  Signed,  Dulcy Fanny. [name], DO  [facility]  [facility]  Signed,  Dulcy Fanny. [name], DO  [facility]  [facility]   Electronically Signed   By:  Corrie Mckusick D.O.   On: 07/08/2014 12:16   Dg Chest 2 View  06/21/2014   CLINICAL DATA:  Two day history of cough and difficulty breathing. Fever  EXAM: CHEST  2 VIEW  COMPARISON:  None.  FINDINGS: There is a nipple shadow on the left. The interstitium is mildly prominent in the lower lung zones, slightly greater on the right than on the left, probably reflecting chronic inflammatory type change. There is no frank edema or consolidation. Heart size and pulmonary vascularity are normal. No adenopathy. No bone lesions. There are surgical clips in the left axillary region.  IMPRESSION: Mild interstitial prominence in the lower lobes, probably reflecting chronic inflammatory type change. No frank edema or consolidation.   Electronically Signed   By: Lowella Grip III M.D.   On: 06/21/2014 15:58   Dg Abd 1 View  06/23/2014   CLINICAL DATA:  OG tube placement.  EXAM: ABDOMEN - 1 VIEW  COMPARISON:  06/23/2014  FINDINGS: Enteric tube tip and side hole projected over the left upper quadrant consistent with location in the mid stomach. Visualized bowel gas pattern is normal. Diffuse coarse fibrosis in the lungs.  IMPRESSION: Enteric tube tip localizes over the mid stomach.   Electronically Signed   By: Lucienne Capers M.D.   On: 06/23/2014 22:53   Ct Angio Chest Pe W/cm &/or Wo Cm  06/21/2014   CLINICAL DATA:  54 year old female with shortness of breath and cough since Monday. Neck and back pain. Initial encounter. Hypoxia  EXAM: CT ANGIOGRAPHY CHEST WITH CONTRAST  TECHNIQUE: Multidetector CT imaging of the chest was performed using the standard protocol during bolus administration of intravenous contrast. Multiplanar CT image reconstructions and MIPs were obtained to evaluate the vascular anatomy.  CONTRAST:  122mL OMNIPAQUE IOHEXOL 350 MG/ML SOLN  COMPARISON:  Chest radiographs 1540 hours today. CT Abdomen and Pelvis 07/23/2011.  FINDINGS: Good contrast bolus timing in the pulmonary arterial tree. Respiratory  motion artifact in the lower lobes. No focal filling defect identified in the pulmonary arterial tree to suggest the presence of acute pulmonary embolism.  Compared to the lung bases on 07/23/2011, there is diffuse pulmonary septal thickening. There is also vague and nodular peribronchovascular in the lungs bilaterally, slightly greater on the right and maximal in the lower lobe. There is superimposed central lobular upper lobe predominant emphysema. Small right side Bochdalek hernia.  No pericardial or pleural effusion. Mildly enlarged hilar lymph nodes on the right. Indistinct increased mediastinal soft tissue about the carina measuring up to 10 mm short axis. Aberrant right subclavian artery origin. Increased pretracheal soft tissue measuring up to 9 mm short axis at the level of the great vessels. Soft and calcified plaque in the descending thoracic aorta and continuing into the abdominal aorta.  Negative visualized liver, spleen, adrenal glands, kidneys, and bowel in the upper abdomen. No axillary lymphadenopathy.  No acute osseous abnormality identified.  Review of  the MIP images confirms the above findings.  IMPRESSION: 1.  No evidence of acute pulmonary embolus. 2. Abnormal lungs with a combination of septal thickening and nodular peribronchovascular opacity. Underlying emphysema. No pleural fluid. Favor bilateral pneumonia over acute pulmonary edema. 3. Increased right hilar and mediastinal soft tissue favored to be reactive lymphadenopathy. 4. Aortic atherosclerosis.  Aberrant right subclavian artery.   Electronically Signed   By: Genevie Ann M.D.   On: 06/21/2014 17:23   Dg Chest Port 1 View  07/04/2014   CLINICAL DATA:  Respiratory failure  EXAM: PORTABLE CHEST - 1 VIEW  COMPARISON:  Portable chest x-ray of 07/03/2014  FINDINGS: The apparent prior interstitial edema pattern has improved considerably. Atelectasis and possible small effusions remain at the lung bases. Mild cardiomegaly is stable. The left IJ  central venous line tip overlies the lower SVC near the expected RA junction.  IMPRESSION: Improvement and interstitial edema. Probable small effusions and mild basilar atelectasis.   Electronically Signed   By: Ivar Drape M.D.   On: 07/04/2014 07:13   Dg Chest Port 1 View  07/03/2014   CLINICAL DATA:  History of bilateral pneumonia and right hilar and mediastinal reactive lymphadenopathy. Intubated. Now on BiPAP. Acute respiratory failure with worsening in the past 2 days.  EXAM: PORTABLE CHEST - 1 VIEW  COMPARISON:  07/02/2014  FINDINGS: Left IJ central line tip overlies the level of the superior vena cava. Heart size is normal. There is increased opacity at the left lung base. Patchy opacity persists at the right lung base. Persistent interstitial edema. Small bilateral pleural effusions are suspected. Surgical clips are identified in the left axillary region.  Dense opacity overlies the left midchest, likely artifactual.  IMPRESSION: 1. Increased opacity in the left lower lobe consistent with increasing atelectasis or infiltrate. 2. Persistent interstitial edema.   Electronically Signed   By: Nolon Nations M.D.   On: 07/03/2014 10:18   Dg Chest Port 1 View  07/02/2014   CLINICAL DATA:  Acute respiratory failure. Community acquired pneumonia. Pleural effusion.  EXAM: PORTABLE CHEST - 1 VIEW  COMPARISON:  06/30/2014  FINDINGS: Endotracheal tube and nasogastric tube been removed. Left jugular central venous catheter remains in appropriate position. No pneumothorax visualized.  Diffuse interstitial infiltrates show no significant change, consistent with diffuse interstitial edema. Small bilateral pleural effusions are also stable. Decreased pulmonary opacity noted in the left retrocardiac lung base. Heart size is within normal limits.  IMPRESSION: Improving pulmonary opacity in left retrocardiac lung base.  Diffuse interstitial edema pattern and small bilateral pleural effusions, without significant change.    Electronically Signed   By: Earle Gell M.D.   On: 07/02/2014 07:17   Dg Chest Port 1 View  06/30/2014   CLINICAL DATA:  Respiratory failure, intubated patient.  EXAM: PORTABLE CHEST - 1 VIEW  COMPARISON:  Portable chest x-ray of June 29, 2014  FINDINGS: The lungs are well-expanded. The interstitial markings are diffusely increased but have improved slightly since yesterday's exam. Confluent densities in the left mid lung and right infrahilar early region are less conspicuous as well. The cardiac silhouette is normal in size. The pulmonary vascularity is more distinct and less engorged today. The endotracheal tube tip projects 5.1 cm above the crotch of the carina. The esophagogastric tube tip projects below the inferior margin of the image. The left internal jugular venous catheter tip projects over the midportion of the SVC.  IMPRESSION: Improved appearance of the pulmonary interstitium suggesting improving interstitial edema/pneumonia. Alveolar  opacities bilaterally are less conspicuous as well.   Electronically Signed   By: David  Martinique   On: 06/30/2014 07:18   Dg Chest Port 1 View  06/29/2014   CLINICAL DATA:  Evaluate endotracheal tube position.  EXAM: PORTABLE CHEST - 1 VIEW  COMPARISON:  06/28/2014.  FINDINGS: Support apparatus: Endotracheal tube tip 32 mm from the carina. Enteric tube present and esophagus with the tip not visualized. LEFT IJ central line with the tip in the mid SVC. Monitoring leads project over the chest.  Cardiomediastinal Silhouette: Within normal limits for size, unchanged.  Lungs: Allowing for technical differences, no interval change and interstitial and alveolar pulmonary edema. This is greater on the RIGHT than LEFT. Kerley B-lines are present in the RIGHT upper lobe. No pneumothorax.  Effusions:  Small bilateral pleural effusions.  Other:  Surgical clips over the LEFT axilla  IMPRESSION: 1. Support apparatus unchanged and in good position. 2. Little if any interval change  and diffuse interstitial and airspace opacity, most compatible with pulmonary edema.   Electronically Signed   By: Dereck Ligas M.D.   On: 06/29/2014 07:15   Portable Chest Xray  06/28/2014   CLINICAL DATA:  Acute respiratory failure, check endotracheal tube placement  EXAM: PORTABLE CHEST - 1 VIEW  COMPARISON:  06/27/2014  FINDINGS: Cardiac shadow is stable. A left jugular central line and endotracheal tube are noted in satisfactory position. Diffuse bilateral infiltrative changes are noted. Given some technical variations in the film the overall appearance is stable. No new focal abnormality is seen. A left pleural effusion is again noted.  IMPRESSION: Diffuse bilateral infiltrative changes stable from the prior study.   Electronically Signed   By: Inez Catalina M.D.   On: 06/28/2014 12:34   Dg Chest Port 1 View  06/27/2014   CLINICAL DATA:  Post right thoracentesis  EXAM: PORTABLE CHEST - 1 VIEW  COMPARISON:  06/27/2014  FINDINGS: Cardiomediastinal silhouette is stable. Stable endotracheal and NG tube position. Stable left IJ central line position. Persistent interstitial prominence bilaterally highly suspicious for interstitial edema or pneumonitis. Improvement in aeration in right base with significant decrease in right pleural effusion. No pneumothorax. Trace left basilar atelectasis.  IMPRESSION: Stable support apparatus. Again noted interstitial prominence bilaterally suspicious for interstitial edema or pneumonitis. Improvement in aeration right base with significant decrease in right pleural effusion. No pneumothorax.   Electronically Signed   By: Lahoma Crocker M.D.   On: 06/27/2014 11:44   Dg Chest Port 1 View  06/27/2014   CLINICAL DATA:  Pneumonia  EXAM: PORTABLE CHEST - 1 VIEW  COMPARISON:  Portable chest x-ray of June 26, 2014  FINDINGS: The lungs are well-expanded. The interstitial markings remain diffusely increased. The hemidiaphragms are obscured. The cardiac silhouette is normal in size.  The pulmonary vascularity is indistinct. The endotracheal tube tip lies 3 cm above the carina. The esophagogastric tube tip projects below the inferior margin of the image. The left internal jugular venous catheter tip projects over the midportion of the SVC.  IMPRESSION: Further deterioration in the appearance of the pulmonary interstitium since yesterday's study. Pneumonia, pulmonary edema, and ARDS remain the leading entities in the differential diagnosis. The support tubes and lines are in appropriate position radiographically.   Electronically Signed   By: David  Martinique   On: 06/27/2014 07:22   Dg Chest Port 1 View  06/26/2014   CLINICAL DATA:  Acute respiratory failure with hypoxia  EXAM: PORTABLE CHEST - 1 VIEW  COMPARISON:  June 25, 2014  FINDINGS: Endotracheal tube tip is 4.5 cm above the carina. Nasogastric tube tip and side port are below the diaphragm. Central catheter tip is in the superior cava. No pneumothorax. There is diffuse interstitial edema with patchy alveolar opacity throughout both lungs. There is a small right pleural effusion. The appearance is stable. Heart size and pulmonary vascularity are normal. No adenopathy.  IMPRESSION: Tube and catheter positions as described without pneumothorax. Interstitial and patchy alveolar opacity diffusely. Suspect multifocal pneumonia. Atypical congestive heart failure and/or ARDS superimposed also are considerations. More than one of these entities may well exist concurrently. There is a small right effusion.   Electronically Signed   By: Lowella Grip III M.D.   On: 06/26/2014 07:24   Dg Chest Port 1 View  06/25/2014   CLINICAL DATA:  Portable chest x-ray for acute respiratory failure.  EXAM: PORTABLE CHEST - 1 VIEW  COMPARISON:  06/24/2014  FINDINGS: The trachea tube, NG to, and right tissues line are unchanged. Stable cardiac silhouette. There is bilateral patchy airspace disease with some and consolidation in the lower lobes. No  pneumothorax.  IMPRESSION: 1. Stable support apparatus. 2. Bilateral diffuse and patchy airspace disease is concerning for multifocal pneumonia. Some increased consolidation in the lower lobes.   Electronically Signed   By: Suzy Bouchard M.D.   On: 06/25/2014 08:59   Dg Chest Port 1 View  06/24/2014   CLINICAL DATA:  Acute respiratory failure  EXAM: PORTABLE CHEST - 1 VIEW  COMPARISON:  06/23/2014  FINDINGS: An endotracheal tube, nasogastric catheter and left-sided jugular central line are again seen and stable in appearance apparent cardiac shadow is within normal limits. Increasing bilateral infiltrates are identified in the perihilar regions when compare with the prior exam. The previously seen pattern of congestive failure with interstitial edema is again noted. No sizable effusion is seen.  IMPRESSION: CHF with superimposed perihilar infiltrates bilaterally.  Tubes and lines stable from the prior exam.   Electronically Signed   By: Inez Catalina M.D.   On: 06/24/2014 06:47   Dg Chest Port 1 View  06/23/2014   CLINICAL DATA:  Hypoxia  EXAM: PORTABLE CHEST - 1 VIEW  COMPARISON:  June 22, 2014  FINDINGS: Endotracheal tube tip is 5.3 cm above the carina. Central catheter tip is in the superior vena cava. Nasogastric tube tip and side port are in the stomach. No pneumothorax. There is underlying emphysema. There is generalized interstitial edema with mild alveolar edema in the right base. No new opacity. Heart size is upper normal with pulmonary vascularity within normal limits.  IMPRESSION: Tube and catheter positions as described without pneumothorax. The appearance is consistent with a degree of post congestive heart failure superimposed on emphysematous change. Suspect mild alveolar edema in the right base, although superimposed pneumonia in the right base cannot be excluded radiographically.   Electronically Signed   By: Lowella Grip III M.D.   On: 06/23/2014 07:10   Dg Chest Port 1  View  06/22/2014   CLINICAL DATA:  Intubated patient, acute respiratory failure.  EXAM: PORTABLE CHEST - 1 VIEW  COMPARISON:  Portable chest x-ray of June 21, 2014  FINDINGS: The endotracheal tube tip lies 5.2 cm above the crotch of the carina. The esophagogastric tube tip projects below the GE junction. The left internal jugular venous catheter tip projects over the proximal portion of the SVC. The cardiac silhouette is normal in size. The pulmonary vascularity is not engorged. The interstitial markings of  both lungs are increased there is increasing density in the right infrahilar region. There is a small correction there small bilateral pleural effusions.  IMPRESSION: Persistent pulmonary interstitial edema superimposed upon COPD. Right lower lobe atelectasis or pneumonia atelectasis or pneumonia. There are small bilateral pleural effusions. The support tubes and lines are in appropriate position radiographically.   Electronically Signed   By: David  Martinique   On: 06/22/2014 07:29   Dg Chest Port 1 View  06/21/2014   CLINICAL DATA:  Postcentral line placement.  EXAM: PORTABLE CHEST - 1 VIEW  COMPARISON:  06/21/2014  FINDINGS: Endotracheal tube tip measures 5.2 cm above the carinal. Enteric tube tip is off the field of view. Left central venous catheter has been placed since previous study. Tip overlies the low SVC. No pneumothorax. Normal heart size. Increased pulmonary vascularity with perihilar and interstitial infiltrates likely representing edema. Lung bases are not included on the field of view. Surgical clips in the left chest.  IMPRESSION: Appliances appear in satisfactory location. Persistent pulmonary vascular congestion and bilateral pulmonary infiltrates likely due to edema.   Electronically Signed   By: Lucienne Capers M.D.   On: 06/21/2014 21:44   Dg Chest Portable 1 View  06/21/2014   CLINICAL DATA:  Shortness of breath and respiratory failure. Status post intubation.  EXAM: PORTABLE  CHEST - 1 VIEW  COMPARISON:  Chest x-ray at 1540 hours  FINDINGS: Endotracheal tube present with the tip approximately 3.8 cm above the carina. A nasogastric tube has been placed which extends below the diaphragm lungs show significant worsening of diffuse pulmonary edema since the prior chest x-ray which is slightly worse in the right lung compared to the left. Given asymmetry, there may be a component of right lower lung pneumonia as well. No significant pleural fluid is identified. The heart size is stable and normal.  IMPRESSION: Significant worsening of bilateral pulmonary edema. Endotracheal tube tip is approximately 3.8 cm above the carina. Component of right lower lung pneumonia cannot be excluded.   Electronically Signed   By: Aletta Edouard M.D.   On: 06/21/2014 20:09   Dg Abd Portable 1v  06/28/2014   CLINICAL DATA:  Initial encounter for NG tube placement  EXAM: PORTABLE ABDOMEN - 1 VIEW  COMPARISON:  06/23/2014.  On  FINDINGS: OG tube tip overlies the mid to distal stomach. Bowel gas pattern is nonspecific.  IMPRESSION: Tip of the OG tube overlies the mid to distal stomach.   Electronically Signed   By: Misty Stanley M.D.   On: 06/28/2014 18:19   Dg Abd Portable 1v  06/23/2014   CLINICAL DATA:  Feeding tube placement.  EXAM: PORTABLE ABDOMEN - 1 VIEW  COMPARISON:  July 02, 2010.  FINDINGS: The bowel gas pattern is normal. Distal tip of feeding tube appears to be at the level of midesophagus.  IMPRESSION: Distal tip of feeding tube appears to be at the level of mid esophagus. Advancement is recommended.   Electronically Signed   By: Marijo Conception, M.D.   On: 06/23/2014 16:59   Dg Swallowing Func-speech Pathology  07/06/2014   : Please select appropriate template.   Electronically Signed   ByPorfirio Mylar   On: 07/06/2014 10:08      ASSESSMENT/PLAN:  1.  Moderate mitral stenosis with severe MR - this is a new diagnosis for her.  There was no evidence of vegetation.  She will need to  followup with Dr. Mare Ferrari as an outpt in the next  1-2 weeks for further outpt workup of this with right and left heart cath once her acute illness has resolved.   2.  Acute respiratory failure secondary to PNA/AECOPD/Influenza A and pulmonary edema 3.  Acute diastolic CHF with good diuresis - continue PO Lasix  Will sign off.  Call with any questions.  Followup with Dr. Mare Ferrari in next 1-2 weeks    Sueanne Margarita, MD  07/11/2014  8:22 AM

## 2014-07-11 NOTE — Progress Notes (Signed)
Went over all discharge information with pt and her father.  All questions answered.  Prescriptions called into pharmacy.  VSS.  Pt wheeled out by NT.

## 2014-07-11 NOTE — Discharge Summary (Signed)
Physician Discharge Summary  Patient ID: Elaine Le MRN: [id number] DOB/AGE: [birth year] 54 y.o.  Admit date: 06/21/2014 Discharge date: 07/11/2014  Problem List Principal Problem:   Acute respiratory failure with hypoxia Active Problems:   Acute respiratory failure with hypercapnia   Acute respiratory failure   Community acquired pneumonia   Pleural effusion   Influenza A   Encephalopathy acute   Muscular deconditioning   Mitral valve vegetation   Protein-calorie malnutrition, severe   Mitral valve stenosis, moderate   Mitral regurgitation  HPI: Elaine Le is a 54 y.o. F with PMH as outlined below She was brought to [facility] ED 2/24 with SOB and cough x 3 days. She is a smoker, close to 1 ppd for unknown # of years. Her mother and father state that she began to "feel bad" 3 days ago; however, symptoms seemed to have improved somewhat one day prior to ED presentation. Unfortunately on day of presentation, pt began coughing more and felt more congested. her sister who brought her to ED. No known fevers/chills/sweats, chest pain, N/V/D, myalgias, LE edema or pain. No recent long trips or periods of immobilization. No similar episodes in the past. Her friend does think she had URI over the past few days. On ED arrival, she was found to be hypoxic and in significant respiratory distress. ABG revealed significant respiratory acidosis failed bipapp and intubated with 6.5 ET tube. PCCM admitting 06/21/2014   has a past medical history of Chronic sinusitis; Arthritis; Fibromyalgia; GERD (gastroesophageal reflux disease); Irritable bowel syndrome (IBS); Headaches, cluster; Back pain; Fatigue; Muscle pain; Night sweats; Continuous urine leakage; Diverticulosis; Adenomatous colon polyp; Internal hemorrhoids; and Hiatal hernia. 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 Reintubated for increased work of breathing. Levophed restarted overnight.  3/04 extubated 3/07 on BIPAP on and off since extubation. Work of breathing worse. ABX widened for HCAP 3/08 remarkably better 3/09 asked Cards to see re: ECHO findings. Plan for TEE.  3/10 transfer to floor 3/15 dc home   Respiratory viral panel 2/25 >> Influenza A Blood 3/09 >>  Acute Hypoxic Respiratory failure 2nd to AECOPD, PNA, Influenza A, and pulmonary edema. Tobacco abuse. Plan:  Continue symbicort D/c nicotine patch Wean off prednisone as tolerated Bronchial hygiene Plan d/c augmentin 3/18  Will need outpt PFT's  Mobile density on mitral valve on TTE >> TEE 3/14 consistent with mod MS/severe MR from rheumatic heart disease; no vegetation. Plan:  D/c vancomycin 3/14 F/u with cardiology as instructed by Dr. Mare Ferrari  Dysphagia >> improved. Plan:  Regular diet   GERD. Plan: Protonix  Hx of IBS, Fibromyalgia, HA, chronic back pain. Plan: PRN analgesics   Labs at discharge Lab Results  Component Value Date   CREATININE 0.49* 07/10/2014   BUN 10 07/10/2014   NA 142 07/10/2014   K 4.1 07/10/2014   CL 105 07/10/2014   CO2 28 07/10/2014   Lab Results  Component Value Date   WBC 11.1* 07/10/2014   HGB 11.2* 07/10/2014   HCT 33.6* 07/10/2014   MCV 92.8 07/10/2014   PLT 396 07/10/2014   Lab Results  Component Value Date   ALT 51* 07/04/2014   AST  19 07/04/2014   ALKPHOS 91 07/04/2014   BILITOT 0.7 07/04/2014   Lab Results  Component Value Date   INR 0.98 07/10/2014    Current radiology studies No results found.  Disposition:  01-Home or Self Care     Medication List    STOP taking these medications        pseudoephedrine 30 MG tablet  Commonly  known as:  SUDAFED      TAKE these medications        amoxicillin-clavulanate 875-125 MG per tablet  Commonly known as:  AUGMENTIN  Take 1 tablet by mouth 2 (two) times daily.     aspirin 81 MG tablet  Take 162 mg by mouth at bedtime.     budesonide-formoterol 160-4.5 MCG/ACT inhaler  Commonly known as:  SYMBICORT  Inhale 2 puffs into the lungs 2 (two) times daily.     ibuprofen 200 MG tablet  Commonly known as:  ADVIL,MOTRIN  Take 400 mg by mouth every 6 (six) hours as needed (pain).     lactulose 10 GM/15ML solution  Commonly known as:  CHRONULAC  Take 15-30 ml (1-2 tablespoons) daily     nicotine 7 mg/24hr patch  Commonly known as:  NICODERM CQ - dosed in mg/24 hr  Place 1 patch (7 mg total) onto the skin daily.     polyethylene glycol powder powder  Commonly known as:  GLYCOLAX  Take 255 g by mouth daily as needed.     predniSONE 10 MG tablet  Commonly known as:  DELTASONE  1 tab times 3 days then stop     ranitidine 150 MG tablet  Commonly known as:  ZANTAC  TAKE 1 TABLET BY MOUTH EVERY DAY     sodium chloride 0.65 % nasal spray  Commonly known as:  OCEAN  Place 2 sprays into the nose daily as needed for congestion (congestion).           Follow-up Information    Follow up with PARRETT,TAMMY, NP On 07/17/2014.   Specialty:  Nurse Practitioner   Why:  54am    Contact information:   [address] [address] [phone]       Follow up with Simonne Maffucci, MD On 08/23/2014.   Specialty:  Pulmonary Disease   Why:  54 pm    Contact information:   [place] [address] [phone]       Follow up with Truitt Merle, NP On 07/25/2014.   Specialty:  Nurse Practitioner   Why:  @ 54pm   Contact information:   [address] [place] [address] [phone]        Discharged Condition: good  Time spent on discharge greater than 40 minutes.  Vital signs at Discharge. Temp:  [98 F (36.7 C)-98.6 F (37  C)] 98.4 F (36.9 C) (03/15 0531) Pulse Rate:  [85-95] 95 (03/15 0531) Resp:  [18-20] 18 (03/15 0531) BP: (93-114)/(46-66) 105/60 mmHg (03/15 0531) SpO2:  [98 %-100 %] 98 % (03/15 0749) Weight:  [94 lb 4.8 oz (42.774 kg)] 94 lb 4.8 oz (42.774 kg) (03/15 0531) Office follow up Special Information or instructions. Follow up with Tammy Parrett and Dr. Lake Bells as instructed.  Signed: Richardson Landry Minor ACNP [facility] PCCM Pager [phone] till 3 pm If no answer page [phone] 07/11/2014, 1:58 PM  Chesley Mires, MD [place] 07/11/2014, 2:07 PM Pager:  [phone] After 3pm call: [phone]

## 2014-07-11 NOTE — Consult Note (Signed)
Came back to visit patient at bedside to see if she thought about accepting [place] [facility] services. She responds by saying " I thought about it and I am not going to do it, I have your information to call though". Will make inpatient RNCM aware that patient declined [place] [facility] services. Marthenia Rolling, MSN-RN,BSN- [facility] [phone]

## 2014-07-17 ENCOUNTER — Encounter: Payer: Self-pay | Admitting: Adult Health

## 2014-07-17 ENCOUNTER — Ambulatory Visit (INDEPENDENT_AMBULATORY_CARE_PROVIDER_SITE_OTHER)
Admission: RE | Admit: 2014-07-17 | Discharge: 2014-07-17 | Disposition: A | Discharge status: Home / Self Care | Payer: Medicare Other | Source: Ambulatory Visit | Attending: Adult Health | Admitting: Adult Health

## 2014-07-17 ENCOUNTER — Ambulatory Visit (INDEPENDENT_AMBULATORY_CARE_PROVIDER_SITE_OTHER): Payer: Medicare Other | Admitting: Adult Health

## 2014-07-17 VITALS — BP 98/60 | HR 71 | Temp 98.6°F | Ht 60.0 in | Wt 103.2 lb

## 2014-07-17 DIAGNOSIS — J189 Pneumonia, unspecified organism: Secondary | ICD-10-CM

## 2014-07-17 DIAGNOSIS — I050 Rheumatic mitral stenosis: Secondary | ICD-10-CM

## 2014-07-17 DIAGNOSIS — J449 Chronic obstructive pulmonary disease, unspecified: Secondary | ICD-10-CM | POA: Diagnosis not present

## 2014-07-17 HISTORY — DX: Chronic obstructive pulmonary disease, unspecified: J44.9

## 2014-07-17 NOTE — Assessment & Plan Note (Signed)
Probable COPD  Intolerant to symbicort  For now leave off inhalers Check PFT on return  Smoking cessation .

## 2014-07-17 NOTE — Patient Instructions (Addendum)
Continue on current regimen .  Great job with not smoking  Follow up with Dr. Lake Bells as planned next month with PFT  Please contact office for sooner follow up if symptoms do not improve or worsen or seek emergency care

## 2014-07-17 NOTE — Progress Notes (Signed)
I agree with the above plan of care 

## 2014-07-17 NOTE — Assessment & Plan Note (Signed)
Clinically improved  Check cxr today for clearance .

## 2014-07-17 NOTE — Assessment & Plan Note (Signed)
Follow up with card as planned next week.

## 2014-07-17 NOTE — Progress Notes (Signed)
   Subjective:    Patient ID: Elaine Le, female    DOB: July 26, 1960, 54 y.o.   MRN: [id number]  HPI 53 yo female smoker  seen for pulm consult for acute hypoxic /hypercarb RF secondary to CAP and Influenza. 06/21/14    07/17/2014 [facility] follow up  Returns for post hospital follow up  Reports breathing is doing well overall. Unable to tolerate the Symbicort - insomnia Admitted for prolonged critical illness 2/24-3/15 for  acute hypoxic /hypercarb RF secondary to CAP and Influenza. .she had a prolonged intubation w/ extubation and reintubation w/ tenuous resp status for several days. Tx w/ aggressive abx. She turned the corner with slow progress.  Thought to have a MV vegetation however TEE showed mod MS/severe MR from RHD w/ NO vegetation. Has been set up with cardiology follow up .  Discharged on Augmentin and steroid taper.  She is feeling better, no dyspnea at rest . No swelling  Get tired easy and does get slightly winded with activity.  Has not smoked since discharge.  No chest pain, orthlpnea, edema or hemoptysis .   Review of Systems Constitutional:   No  weight loss, night sweats,  Fevers, chills, fatigue, or  lassitude.  HEENT:   No headaches,  Difficulty swallowing,  Tooth/dental problems, or  Sore throat,                No sneezing, itching, ear ache, nasal congestion, post nasal drip,   CV:  No chest pain,  Orthopnea, PND, swelling in lower extremities, anasarca, dizziness, palpitations, syncope.   GI  No heartburn, indigestion, abdominal pain, nausea, vomiting, diarrhea, change in bowel habits, loss of appetite, bloody stools.   Resp: .  No excess mucus, no productive cough,  No non-productive cough,  No coughing up of blood.  No change in color of mucus.  No wheezing.  No chest wall deformity  Skin: no rash or lesions.  GU: no dysuria, change in color of urine, no urgency or frequency.  No flank pain, no hematuria   MS:  No joint pain or swelling.  No  decreased range of motion.  No back pain.  Psych:  No change in mood or affect. No depression or anxiety.  No memory loss.         Objective:   Physical Exam GEN: A/Ox3; pleasant , NAD, thin   HEENT:  [place]/AT,  EACs-clear, TMs-wnl, NOSE-clear, THROAT-clear, no lesions, no postnasal drip or exudate noted.   NECK:  Supple w/ fair ROM; no JVD; normal carotid impulses w/o bruits; no thyromegaly or nodules palpated; no lymphadenopathy.  RESP  Clear  P & A; w/o, wheezes/ rales/ or rhonchi.no accessory muscle use, no dullness to percussion  CARD:  RRR, 1/6 SM   , no peripheral edema, pulses intact, no cyanosis or clubbing.  GI:   Soft & nt; nml bowel sounds; no organomegaly or masses detected.  Musco: Warm bil, no deformities or joint swelling noted.   Neuro: alert, no focal deficits noted.    Skin: Warm, no lesions or rashes        Assessment & Plan:

## 2014-07-18 NOTE — Progress Notes (Signed)
Quick Note:  Called spoke with patient, advised of cxr results / recs as stated by TP. Pt verbalized her understanding and denied any questions. ______ 

## 2014-07-19 ENCOUNTER — Telehealth: Payer: Self-pay | Admitting: Adult Health

## 2014-07-19 NOTE — Telephone Encounter (Signed)
lmtcb x1 for pt. 

## 2014-07-19 NOTE — Telephone Encounter (Signed)
Patient calling states she is tepping out and will return around 12:30pm please call after 12:30pm, [phone]

## 2014-07-19 NOTE — Telephone Encounter (Signed)
Pt returned call - [phone]

## 2014-07-19 NOTE — Telephone Encounter (Signed)
Pt c/o still having diff sleeping.  Falls asleep and only stays asleep for 15 min to and hour and then is up and down until around 5 am and then gets up due to diff breathing.  Diff taking a deep breathe, mouth dryness, nasal congestion, and some ankle swelling.  Seen PCP yesterday but he didn't not make any changes other than give rx for rescued inhaler.  Pt asked if she could take Mucinex that she was on in the hospital.  Pt advised that this would be ok to take and that we will ask TP for other recommendations when she returns to the office tomorrow am.

## 2014-07-20 NOTE — Telephone Encounter (Signed)
Called spoke with pt. Aware of recs. She verbalized understanding. Nothing further needed

## 2014-07-20 NOTE — Telephone Encounter (Signed)
Unclear what is contributing to sleep issues  When is her cardiology appointment .  Found to have valve issues.  ? Orthopnea-- Would track weights daily , keep legs elevated, low salt diet.  Could retry symbicort as may her breathing .  If not improving will need sooner ov ,  Please contact office for sooner follow up if symptoms do not improve or worsen or seek emergency care

## 2014-07-20 NOTE — Telephone Encounter (Signed)
ATC, voicemail not set up. wcb

## 2014-07-25 ENCOUNTER — Inpatient Hospital Stay (HOSPITAL_COMMUNITY)
Admission: EM | Admit: 2014-07-25 | Discharge: 2014-07-29 | DRG: 287 | Disposition: A | Discharge status: Home / Self Care | Payer: Medicare Other | Attending: Cardiology | Admitting: Cardiology

## 2014-07-25 ENCOUNTER — Emergency Department (HOSPITAL_COMMUNITY): Payer: Medicare Other

## 2014-07-25 ENCOUNTER — Encounter (HOSPITAL_COMMUNITY): Payer: Self-pay | Admitting: Emergency Medicine

## 2014-07-25 ENCOUNTER — Other Ambulatory Visit (HOSPITAL_COMMUNITY): Payer: Self-pay

## 2014-07-25 ENCOUNTER — Encounter: Payer: Medicare Other | Admitting: Nurse Practitioner

## 2014-07-25 DIAGNOSIS — G894 Chronic pain syndrome: Secondary | ICD-10-CM | POA: Diagnosis present

## 2014-07-25 DIAGNOSIS — I052 Rheumatic mitral stenosis with insufficiency: Principal | ICD-10-CM | POA: Diagnosis present

## 2014-07-25 DIAGNOSIS — M797 Fibromyalgia: Secondary | ICD-10-CM | POA: Diagnosis present

## 2014-07-25 DIAGNOSIS — I7409 Other arterial embolism and thrombosis of abdominal aorta: Secondary | ICD-10-CM

## 2014-07-25 DIAGNOSIS — J9600 Acute respiratory failure, unspecified whether with hypoxia or hypercapnia: Secondary | ICD-10-CM

## 2014-07-25 DIAGNOSIS — I5033 Acute on chronic diastolic (congestive) heart failure: Secondary | ICD-10-CM | POA: Diagnosis not present

## 2014-07-25 DIAGNOSIS — J9621 Acute and chronic respiratory failure with hypoxia: Secondary | ICD-10-CM | POA: Diagnosis present

## 2014-07-25 DIAGNOSIS — Z87891 Personal history of nicotine dependence: Secondary | ICD-10-CM | POA: Diagnosis present

## 2014-07-25 DIAGNOSIS — J449 Chronic obstructive pulmonary disease, unspecified: Secondary | ICD-10-CM | POA: Diagnosis present

## 2014-07-25 DIAGNOSIS — J9601 Acute respiratory failure with hypoxia: Secondary | ICD-10-CM | POA: Diagnosis not present

## 2014-07-25 DIAGNOSIS — I340 Nonrheumatic mitral (valve) insufficiency: Secondary | ICD-10-CM

## 2014-07-25 DIAGNOSIS — K219 Gastro-esophageal reflux disease without esophagitis: Secondary | ICD-10-CM | POA: Diagnosis present

## 2014-07-25 DIAGNOSIS — I2510 Atherosclerotic heart disease of native coronary artery without angina pectoris: Secondary | ICD-10-CM | POA: Diagnosis present

## 2014-07-25 DIAGNOSIS — Z7982 Long term (current) use of aspirin: Secondary | ICD-10-CM

## 2014-07-25 DIAGNOSIS — E872 Acidosis: Secondary | ICD-10-CM

## 2014-07-25 DIAGNOSIS — I342 Nonrheumatic mitral (valve) stenosis: Secondary | ICD-10-CM | POA: Diagnosis not present

## 2014-07-25 DIAGNOSIS — F419 Anxiety disorder, unspecified: Secondary | ICD-10-CM | POA: Diagnosis present

## 2014-07-25 DIAGNOSIS — R0902 Hypoxemia: Secondary | ICD-10-CM | POA: Diagnosis not present

## 2014-07-25 DIAGNOSIS — J9602 Acute respiratory failure with hypercapnia: Secondary | ICD-10-CM

## 2014-07-25 DIAGNOSIS — J101 Influenza due to other identified influenza virus with other respiratory manifestations: Secondary | ICD-10-CM | POA: Diagnosis present

## 2014-07-25 DIAGNOSIS — Z720 Tobacco use: Secondary | ICD-10-CM | POA: Diagnosis present

## 2014-07-25 DIAGNOSIS — J811 Chronic pulmonary edema: Secondary | ICD-10-CM

## 2014-07-25 DIAGNOSIS — J9620 Acute and chronic respiratory failure, unspecified whether with hypoxia or hypercapnia: Secondary | ICD-10-CM

## 2014-07-25 DIAGNOSIS — I509 Heart failure, unspecified: Secondary | ICD-10-CM

## 2014-07-25 DIAGNOSIS — K589 Irritable bowel syndrome without diarrhea: Secondary | ICD-10-CM | POA: Diagnosis present

## 2014-07-25 HISTORY — DX: Chronic pain syndrome: G89.4

## 2014-07-25 HISTORY — DX: Pleural effusion, not elsewhere classified: J90

## 2014-07-25 HISTORY — DX: Acute respiratory failure with hypercapnia: J96.02

## 2014-07-25 HISTORY — DX: Nonrheumatic mitral (valve) insufficiency: I34.0

## 2014-07-25 HISTORY — DX: Personal history of colonic polyps: Z86.010

## 2014-07-25 HISTORY — DX: Unspecified severe protein-calorie malnutrition: E43

## 2014-07-25 HISTORY — DX: Rheumatic mitral stenosis: I05.0

## 2014-07-25 HISTORY — DX: Constipation, unspecified: K59.00

## 2014-07-25 HISTORY — DX: Other symptoms and signs involving the musculoskeletal system: R29.898

## 2014-07-25 HISTORY — DX: Tobacco use: Z72.0

## 2014-07-25 HISTORY — DX: Chronic diastolic (congestive) heart failure: I50.32

## 2014-07-25 HISTORY — DX: Chronic obstructive pulmonary disease, unspecified: J44.9

## 2014-07-25 HISTORY — DX: Pneumonia, unspecified organism: J18.9

## 2014-07-25 HISTORY — DX: Irritable bowel syndrome without diarrhea: K58.9

## 2014-07-25 HISTORY — DX: Influenza due to other identified influenza virus with other respiratory manifestations: J10.1

## 2014-07-25 LAB — BLOOD GAS, VENOUS
Acid-base deficit: 3.2 mmol/L — ABNORMAL HIGH (ref 0.0–2.0)
Bicarbonate: 24.3 mEq/L — ABNORMAL HIGH (ref 20.0–24.0)
Delivery systems: POSITIVE
FIO2: 0.5 %
MODE: POSITIVE
O2 Saturation: 59.3 %
PCO2 VEN: 56.7 mmHg — AB (ref 45.0–50.0)
PO2 VEN: 39.3 mmHg (ref 30.0–45.0)
Patient temperature: 98.6
RATE: 15 resp/min
TCO2: 22.7 mmol/L (ref 0–100)
pH, Ven: 7.255 (ref 7.250–7.300)

## 2014-07-25 LAB — DIFFERENTIAL
BASOS PCT: 1 % (ref 0–1)
Basophils Absolute: 0.1 10*3/uL (ref 0.0–0.1)
EOS PCT: 1 % (ref 0–5)
Eosinophils Absolute: 0.1 10*3/uL (ref 0.0–0.7)
Lymphocytes Relative: 37 % (ref 12–46)
Lymphs Abs: 5 10*3/uL — ABNORMAL HIGH (ref 0.7–4.0)
MONO ABS: 1.2 10*3/uL — AB (ref 0.1–1.0)
MONOS PCT: 9 % (ref 3–12)
NEUTROS PCT: 52 % (ref 43–77)
Neutro Abs: 7.2 10*3/uL (ref 1.7–7.7)

## 2014-07-25 LAB — PROTIME-INR
INR: 1 (ref 0.00–1.49)
PROTHROMBIN TIME: 13.3 s (ref 11.6–15.2)

## 2014-07-25 LAB — BLOOD GAS, ARTERIAL
Acid-base deficit: 1.5 mmol/L (ref 0.0–2.0)
Bicarbonate: 23.1 mEq/L (ref 20.0–24.0)
DRAWN BY: 295031
Delivery systems: POSITIVE
Expiratory PAP: 5
FIO2: 0.4 %
INSPIRATORY PAP: 10
O2 SAT: 97.2 %
PATIENT TEMPERATURE: 98.6
PH ART: 7.37 (ref 7.350–7.450)
TCO2: 21 mmol/L (ref 0–100)
pCO2 arterial: 41 mmHg (ref 35.0–45.0)
pO2, Arterial: 109 mmHg — ABNORMAL HIGH (ref 80.0–100.0)

## 2014-07-25 LAB — BASIC METABOLIC PANEL
Anion gap: 10 (ref 5–15)
BUN: 11 mg/dL (ref 6–23)
CO2: 25 mmol/L (ref 19–32)
CREATININE: 0.66 mg/dL (ref 0.50–1.10)
Calcium: 8.9 mg/dL (ref 8.4–10.5)
Chloride: 107 mmol/L (ref 96–112)
GFR calc Af Amer: >90 mL/min (ref >90)
GFR calc non Af Amer: >90 mL/min (ref >90)
Glucose, Bld: 154 mg/dL — ABNORMAL HIGH (ref 70–99)
Potassium: 3.7 mmol/L (ref 3.5–5.1)
Sodium: 142 mmol/L (ref 135–145)

## 2014-07-25 LAB — INFLUENZA PANEL BY PCR (TYPE A & B)
H1N1FLUPCR: NOT DETECTED
INFLAPCR: NEGATIVE
Influenza B By PCR: NEGATIVE

## 2014-07-25 LAB — CBC
HEMATOCRIT: 40.5 % (ref 36.0–46.0)
HEMOGLOBIN: 12.9 g/dL (ref 12.0–15.0)
MCH: 30.6 pg (ref 26.0–34.0)
MCHC: 31.9 g/dL (ref 30.0–36.0)
MCV: 96.2 fL (ref 78.0–100.0)
Platelets: 332 10*3/uL (ref 150–400)
RBC: 4.21 MIL/uL (ref 3.87–5.11)
RDW: 16.3 % — ABNORMAL HIGH (ref 11.5–15.5)
WBC: 13.4 10*3/uL — ABNORMAL HIGH (ref 4.0–10.5)

## 2014-07-25 LAB — URINALYSIS, ROUTINE W REFLEX MICROSCOPIC
BILIRUBIN URINE: NEGATIVE
Glucose, UA: NEGATIVE mg/dL
Hgb urine dipstick: NEGATIVE
Ketones, ur: NEGATIVE mg/dL
Leukocytes, UA: NEGATIVE
Nitrite: NEGATIVE
Protein, ur: NEGATIVE mg/dL
Specific Gravity, Urine: 1.009 (ref 1.005–1.030)
Urobilinogen, UA: 0.2 mg/dL (ref 0.0–1.0)
pH: 5.5 (ref 5.0–8.0)

## 2014-07-25 LAB — I-STAT TROPONIN, ED: TROPONIN I, POC: 0 ng/mL (ref 0.00–0.08)

## 2014-07-25 LAB — APTT: aPTT: 26 seconds (ref 24–37)

## 2014-07-25 LAB — LACTIC ACID, PLASMA: LACTIC ACID, VENOUS: 1.1 mmol/L (ref 0.5–2.0)

## 2014-07-25 LAB — PHOSPHORUS: PHOSPHORUS: 3 mg/dL (ref 2.3–4.6)

## 2014-07-25 LAB — CORTISOL: CORTISOL PLASMA: 38.2 ug/dL

## 2014-07-25 LAB — D-DIMER, QUANTITATIVE (NOT AT ARMC): D DIMER QUANT: 1.65 ug{FEU}/mL — AB (ref 0.00–0.48)

## 2014-07-25 LAB — BRAIN NATRIURETIC PEPTIDE
B NATRIURETIC PEPTIDE 5: 372 pg/mL — AB (ref 0.0–100.0)
B Natriuretic Peptide: 233.9 pg/mL — ABNORMAL HIGH (ref 0.0–100.0)

## 2014-07-25 LAB — TROPONIN I
TROPONIN I: 0.03 ng/mL (ref <0.031)
Troponin I: 0.03 ng/mL (ref <0.031)

## 2014-07-25 LAB — STREP PNEUMONIAE URINARY ANTIGEN: Strep Pneumo Urinary Antigen: NEGATIVE

## 2014-07-25 LAB — MAGNESIUM: MAGNESIUM: 1.9 mg/dL (ref 1.5–2.5)

## 2014-07-25 LAB — PROCALCITONIN

## 2014-07-25 MED ORDER — FUROSEMIDE 10 MG/ML IJ SOLN
40.0000 mg | Freq: Once | INTRAMUSCULAR | Status: AC
  Administered 2014-07-25: 40 mg via INTRAVENOUS
  Filled 2014-07-25: qty 4

## 2014-07-25 MED ORDER — ALBUTEROL SULFATE (2.5 MG/3ML) 0.083% IN NEBU
5.0000 mg | INHALATION_SOLUTION | Freq: Once | RESPIRATORY_TRACT | Status: AC
  Administered 2014-07-25: 5 mg via RESPIRATORY_TRACT
  Filled 2014-07-25: qty 6

## 2014-07-25 MED ORDER — METHYLPREDNISOLONE SODIUM SUCC 125 MG IJ SOLR
125.0000 mg | Freq: Once | INTRAMUSCULAR | Status: AC
  Administered 2014-07-25: 125 mg via INTRAVENOUS
  Filled 2014-07-25: qty 2

## 2014-07-25 MED ORDER — POTASSIUM CHLORIDE CRYS ER 20 MEQ PO TBCR
20.0000 meq | EXTENDED_RELEASE_TABLET | Freq: Two times a day (BID) | ORAL | Status: DC
  Administered 2014-07-25 – 2014-07-28 (×7): 20 meq via ORAL
  Filled 2014-07-25 (×7): qty 1

## 2014-07-25 MED ORDER — HEPARIN SODIUM (PORCINE) 5000 UNIT/ML IJ SOLN
5000.0000 [IU] | Freq: Three times a day (TID) | INTRAMUSCULAR | Status: DC
  Administered 2014-07-25 – 2014-07-26 (×5): 5000 [IU] via SUBCUTANEOUS
  Filled 2014-07-25 (×8): qty 1

## 2014-07-25 MED ORDER — BISOPROLOL FUMARATE 5 MG PO TABS
5.0000 mg | ORAL_TABLET | Freq: Every day | ORAL | Status: DC
  Administered 2014-07-25: 5 mg via ORAL
  Filled 2014-07-25: qty 1

## 2014-07-25 MED ORDER — CETYLPYRIDINIUM CHLORIDE 0.05 % MT LIQD
7.0000 mL | Freq: Two times a day (BID) | OROMUCOSAL | Status: DC
  Administered 2014-07-27 – 2014-07-29 (×4): 7 mL via OROMUCOSAL

## 2014-07-25 MED ORDER — ACETAMINOPHEN 325 MG PO TABS
650.0000 mg | ORAL_TABLET | ORAL | Status: DC | PRN
  Administered 2014-07-27: 650 mg via ORAL
  Filled 2014-07-25: qty 2

## 2014-07-25 MED ORDER — SODIUM CHLORIDE 0.9 % IV SOLN
INTRAVENOUS | Status: DC
  Administered 2014-07-25: 09:00:00 via INTRAVENOUS

## 2014-07-25 MED ORDER — FUROSEMIDE 10 MG/ML IJ SOLN
40.0000 mg | Freq: Two times a day (BID) | INTRAMUSCULAR | Status: DC
  Administered 2014-07-25 – 2014-07-28 (×5): 40 mg via INTRAVENOUS
  Filled 2014-07-25 (×5): qty 4

## 2014-07-25 MED ORDER — PANTOPRAZOLE SODIUM 40 MG IV SOLR
40.0000 mg | Freq: Every day | INTRAVENOUS | Status: DC
  Administered 2014-07-25: 40 mg via INTRAVENOUS
  Filled 2014-07-25: qty 40

## 2014-07-25 MED ORDER — IPRATROPIUM-ALBUTEROL 0.5-2.5 (3) MG/3ML IN SOLN
3.0000 mL | RESPIRATORY_TRACT | Status: DC
  Administered 2014-07-25 – 2014-07-26 (×6): 3 mL via RESPIRATORY_TRACT
  Filled 2014-07-25 (×5): qty 3

## 2014-07-25 MED ORDER — MAGNESIUM SULFATE 2 GM/50ML IV SOLN
2.0000 g | Freq: Once | INTRAVENOUS | Status: AC
  Administered 2014-07-25: 2 g via INTRAVENOUS
  Filled 2014-07-25: qty 50

## 2014-07-25 NOTE — H&P (Signed)
PULMONARY / CRITICAL CARE MEDICINE   Name: Elaine Le MRN: [id number] DOB: 1960-06-12    ADMISSION DATE:  07/25/2014   REFERRING MD : EDP  CHIEF COMPLAINT:  SOB  INITIAL PRESENTATION: SOB  STUDIES:    SIGNIFICANT EVENTS: 3/29 hypercarbic and placed on NIMVS    HISTORY OF PRESENT ILLNESS:   Ms Elaine Le is a pleasant 54 yo WF with severe MR from recent TEE, just discharged from [facility] 3/15 for pna, flu A, MR and was to see Cardiology today(3/29). Seen in pulmonary office 3/23 with complaints of insomnia, weight gain and extremity edema. She presents to [facility] 3/29 with 3 days of increasing orthopnea, insomnia, lower extremity edema coupled with component of anxiety. No fever, chills, sweats, purulent sputum or cough. She has JVD 1/2 way up neck, cxr with RT LLL infiltrate, hyperinflation without other acute process. Placed on nimvs for hypercapnia but is no acute distress. We will admit to ICU, recheck ABG, give lasix. Will hold off on abx as she just completed abx and steroid taper(may account for elevated WBC). Check pro calcitonin for completeness. Cards will need to see her.   PAST MEDICAL HISTORY :   has a past medical history of Chronic sinusitis; Arthritis; Fibromyalgia; GERD (gastroesophageal reflux disease); Irritable bowel syndrome (IBS); Headaches, cluster; Back pain; Fatigue; Muscle pain; Night sweats; Continuous urine leakage; Diverticulosis; Adenomatous colon polyp; Internal hemorrhoids; Hiatal hernia; and Pneumonia.  has past surgical history that includes Thoracic outlet surgery; Tubal ligation; Vulva surgery; Neuroplasty / transposition median nerve at carpal tunnel bilateral; Cystostomy w/ bladder dilation; and TEE without cardioversion (N/A, 07/10/2014). Prior to Admission medications   Medication Sig Start Date End Date Taking? Authorizing Provider  aspirin 81 MG tablet Take 81 mg by mouth at bedtime.    Yes Historical Provider, MD  ibuprofen (ADVIL,MOTRIN) 200 MG  tablet Take 400 mg by mouth every 6 (six) hours as needed (pain).    Yes Historical Provider, MD  lactulose (CHRONULAC) 10 GM/15ML solution Take 10-20 g by mouth daily as needed for mild constipation. 1-2 tablespoons daily as needed   Yes Historical Provider, MD  PREMARIN vaginal cream Place 1 Applicatorful vaginally daily as needed.  05/24/14  Yes Historical Provider, MD  ranitidine (ZANTAC) 150 MG tablet TAKE 1 TABLET BY MOUTH EVERY DAY 05/25/14  Yes Jerene Bears, MD  sodium chloride (OCEAN) 0.65 % nasal spray Place 2 sprays into the nose daily as needed for congestion (congestion).    Yes Historical Provider, MD  polyethylene glycol powder (GLYCOLAX) powder Take 255 g by mouth daily as needed. Patient taking differently: Take 1 Container by mouth daily as needed for mild constipation.  07/11/14   Grace Bushy Lerline Valdivia, NP   Allergies  Allergen Reactions  . Albuterol   . Morphine And Related     Altered mental state    FAMILY HISTORY:  indicated that her mother is alive.  SOCIAL HISTORY:  reports that she quit smoking about 5 weeks ago. Her smoking use included Cigarettes. She has a 17.5 pack-year smoking history. She has never used smokeless tobacco. She reports that she does not drink alcohol or use illicit drugs.  10 point review of system taken, please see HPI for positives and negatives.    SUBJECTIVE:   VITAL SIGNS: Temp:  [98 F (36.7 C)] 98 F (36.7 C) (03/29 0506) Pulse Rate:  [113-122] 118 (03/29 0715) Resp:  [22-31] 23 (03/29 0715) BP: (133-161)/(75-108) 133/108 mmHg (03/29 0715) SpO2:  [83 %-100 %]  100 % (03/29 0715) HEMODYNAMICS:   VENTILATOR SETTINGS:   INTAKE / OUTPUT: No intake or output data in the 24 hours ending 07/25/14 0735  PHYSICAL EXAMINATION: General:  WNWDWF on NIMVS but awake and alert, NAD Neuro:  Intact HEENT:  ++JHD, No LAN Cardiovascular:  HSR RRR Murmur 2/6 Lungs: coarse rhonchi on bipap Abdomen:  Soft, mildly tender Musculoskeletal:  intact Skin: + le edema  LABS:  CBC  Recent Labs Lab 07/25/14 0500  WBC 13.4*  HGB 12.9  HCT 40.5  PLT 332   Coag's No results for input(s): APTT, INR in the last 168 hours. BMET  Recent Labs Lab 07/25/14 0500  NA 142  K 3.7  CL 107  CO2 25  BUN 11  CREATININE 0.66  GLUCOSE 154*   Electrolytes  Recent Labs Lab 07/25/14 0500  CALCIUM 8.9   Sepsis Markers No results for input(s): LATICACIDVEN, PROCALCITON, O2SATVEN in the last 168 hours. ABG No results for input(s): PHART, PCO2ART, PO2ART in the last 168 hours. Liver Enzymes No results for input(s): AST, ALT, ALKPHOS, BILITOT, ALBUMIN in the last 168 hours. Cardiac Enzymes No results for input(s): TROPONINI, PROBNP in the last 168 hours. Glucose No results for input(s): GLUCAP in the last 168 hours.  Imaging No results found.   ASSESSMENT / PLAN:  PULMONARY OETT A: Presumed emphysema(PFT pending) just quit smoking Recent pna COPD P:   O2 as needed NIMVS, will try to dc BD No steroids(not wheezing)  CARDIOVASCULAR CVL  A: Severe MR Presumed volume overload P:  Lasix x 1 BNP Cards consult called 3/29  RENAL A:  No acute issue P:     GASTROINTESTINAL A:  GI protection P:   PPI  HEMATOLOGIC A:   No acute issue P:    INFECTIOUS A:   Will hold off on abx for now(3-29) Culture for completeness P:   3/21 bc x 2>> 3/21 uc>> 3/21 procal>> 3/21 resp viral>> A:   No acute issue  P:     NEUROLOGIC A:   Intact anxiety P:   RASS goal: 0 Rass 0 currently May need mild anxiolytic    FAMILY  - Updates: Sister updated at bedside  - Inter-disciplinary family meet or Palliative Care meeting due by:  day 7    TODAY'S SUMMARY:  Ms Elaine Le is a pleasant 53 yo WF with severe MR from recent TEE, just discharged from [facility] 3/15 for pna, flu A, MR and was to see Cardiology today(3/29). Seen in pulmonary office 3/23 with complaints of insomnia, weight gain and extremity  edema. She presents to [facility] 3/29 with 3 days of increasing orthopnea, insomnia, lower extremity edema coupled with component of anxiety. No fever, chills, sweats, purulent sputum or cough. She has JVD 1/2 way up neck, cxr with RT LLL infiltrate, hyperinflation without other acute process. Placed on nimvs for hypercapnia but is no acute distress. We will admit to ICU, recheck ABG, give lasix. Will hold off on abx as she just completed abx and steroid taper(may account for elevated WBC). Check pro calcitonin for completeness. Cards will need to see her.  Richardson Landry Scharlene Catalina ACNP [facility] PCCM Pager [phone] till 3 pm If no answer page [phone] 07/25/2014, 7:46 AM

## 2014-07-25 NOTE — ED Provider Notes (Signed)
CSN: [id number]     Arrival date & time 07/25/14  0503 History   First MD Initiated Contact with Patient 07/25/14 [phone]     Chief Complaint  Patient presents with  . Shortness of Breath     (Consider location/radiation/quality/duration/timing/severity/associated sxs/prior Treatment) HPI  This is a 54 year old female who was admitted on February 24 for acute respiratory failure requiring intubation. She had a prolonged hospital course and was discharged home on March 15. She has been having worsening shortness of breath in the mornings for the past several days. She woke up severely short of breath about an hour and a half ago. She used her albuterol inhaler without adequate relief. Her daughter brought her to the hospital by private vehicle. On arrival she was noted to be tachypnea "an oxygen saturation of 83% on room air which improved on oxygen by nasal cannula. She was also noted to be tachycardic. She denies chest pain. She has having some lower abdominal pain. She notes she has some lower extremity edema as well. She has not had significant coughing until this morning. She has not had a fever.   Past Medical History  Diagnosis Date  . Chronic sinusitis   . Arthritis   . Fibromyalgia   . GERD (gastroesophageal reflux disease)   . Irritable bowel syndrome (IBS)   . Headaches, cluster   . Back pain   . Fatigue   . Muscle pain   . Night sweats   . Continuous urine leakage   . Diverticulosis   . Adenomatous colon polyp   . Internal hemorrhoids   . Hiatal hernia   . Pneumonia    Past Surgical History  Procedure Laterality Date  . Thoracic outlet surgery    . Tubal ligation    . Vulva surgery    . Neuroplasty / transposition median nerve at carpal tunnel bilateral    . Cystostomy w/ bladder dilation      at age 54  . Tee without cardioversion N/A 07/10/2014    Procedure: TRANSESOPHAGEAL ECHOCARDIOGRAM (TEE);  Surgeon: Lelon Perla, MD;  Location: [facility] ENDOSCOPY;  Service:  Cardiovascular;  Laterality: N/A;   Family History  Problem Relation Age of Onset  . Kidney cancer Mother   . Colon polyps Mother   . Irritable bowel syndrome Mother   . Diabetes Sister   . Liver disease Sister   . Irritable bowel syndrome Daughter   . Diabetes Brother   . Colon cancer Neg Hx    History  Substance Use Topics  . Smoking status: Former Smoker -- 0.50 packs/day for 35 years    Types: Cigarettes    Quit date: 06/17/2014  . Smokeless tobacco: Never Used  . Alcohol Use: No     Comment: rare   OB History    No data available     Review of Systems  All other systems reviewed and are negative.     Allergies  Albuterol and Morphine and related  Home Medications   Prior to Admission medications   Medication Sig Start Date End Date Taking? Authorizing Provider  aspirin 81 MG tablet Take 81 mg by mouth at bedtime.    Yes Historical Provider, MD  ibuprofen (ADVIL,MOTRIN) 200 MG tablet Take 400 mg by mouth every 6 (six) hours as needed (pain).    Yes Historical Provider, MD  lactulose (CHRONULAC) 10 GM/15ML solution Take 10-20 g by mouth daily as needed for mild constipation. 1-2 tablespoons daily as needed   Yes Historical  Provider, MD  PREMARIN vaginal cream Place 1 Applicatorful vaginally daily as needed.  05/24/14  Yes Historical Provider, MD  ranitidine (ZANTAC) 150 MG tablet TAKE 1 TABLET BY MOUTH EVERY DAY 05/25/14  Yes Jerene Bears, MD  sodium chloride (OCEAN) 0.65 % nasal spray Place 2 sprays into the nose daily as needed for congestion (congestion).    Yes Historical Provider, MD  polyethylene glycol powder (GLYCOLAX) powder Take 255 g by mouth daily as needed. Patient taking differently: Take 1 Container by mouth daily as needed for mild constipation.  07/11/14   Grace Bushy Minor, NP   BP 160/86 mmHg  Pulse 122  Temp(Src) 98 F (36.7 C) (Oral)  Resp 30  SpO2 100%   Physical Exam  General: Well-developed, thin female in moderate distress; appearance  consistent with age of record HENT: normocephalic; atraumatic Eyes: pupils equal, round and reactive to light; extraocular muscles intact Neck: supple Heart: regular rate and rhythm; tachycardia Lungs: Tachypnea; accessory muscle use; supraclavicular retractions; decreased air movement without wheezing Abdomen: soft; nondistended; mild lower abdominal tenderness; no masses or hepatosplenomegaly; bowel sounds present Extremities: No deformity; full range of motion; pulses normal; trace edema of lower legs Neurologic: Awake, alert and oriented; motor function intact in all extremities and symmetric; no facial droop Skin: Warm and dry Psychiatric: Anxious     ED Course  Procedures (including critical care time)  CRITICAL CARE Performed by: Yurem Viner L Total critical care time: 30 minutes Critical care time was exclusive of separately billable procedures and treating other patients. Critical care was necessary to treat or prevent imminent or life-threatening deterioration. Critical care was time spent personally by me on the following activities: development of treatment plan with patient and/or surrogate as well as nursing, discussions with consultants, evaluation of patient's response to treatment, examination of patient, obtaining history from patient or surrogate, ordering and performing treatments and interventions, ordering and review of laboratory studies, ordering and review of radiographic studies, pulse oximetry and re-evaluation of patient's condition.   MDM  Nursing notes and vitals signs, including pulse oximetry, reviewed.  Summary of this visit's results, reviewed by myself:  Labs:  Results for orders placed or performed during the hospital encounter of 07/25/14 (from the past 24 hour(s))  Basic metabolic panel    (if pt has PMH of COPD)     Status: Abnormal   Collection Time: 07/25/14  5:00 AM  Result Value Ref Range   Sodium 142 135 - 145 mmol/L   Potassium 3.7 3.5  - 5.1 mmol/L   Chloride 107 96 - 112 mmol/L   CO2 25 19 - 32 mmol/L   Glucose, Bld 154 (H) 70 - 99 mg/dL   BUN 11 6 - 23 mg/dL   Creatinine, Ser 0.66 0.50 - 1.10 mg/dL   Calcium 8.9 8.4 - 10.5 mg/dL   GFR calc non Af Amer >90 >90 mL/min   GFR calc Af Amer >90 >90 mL/min   Anion gap 10 5 - 15  CBC     (if pt has PMH of COPD)     Status: Abnormal   Collection Time: 07/25/14  5:00 AM  Result Value Ref Range   WBC 13.4 (H) 4.0 - 10.5 K/uL   RBC 4.21 3.87 - 5.11 MIL/uL   Hemoglobin 12.9 12.0 - 15.0 g/dL   HCT 40.5 36.0 - 46.0 %   MCV 96.2 78.0 - 100.0 fL   MCH 30.6 26.0 - 34.0 pg   MCHC 31.9 30.0 - 36.0  g/dL   RDW 16.3 (H) 11.5 - 15.5 %   Platelets 332 150 - 400 K/uL  I-stat troponin, ED (if patient has history of COPD)     Status: None   Collection Time: 07/25/14  5:29 AM  Result Value Ref Range   Troponin i, poc 0.00 0.00 - 0.08 ng/mL   Comment 3            Imaging Studies: Dg Chest 2 View (if Patient Has Fever And/or Copd)  07/25/2014   CLINICAL DATA:  Severe shortness of breath, wheezing beginning at 4 a.m.  EXAM: CHEST  2 VIEW  COMPARISON:  Chest radiograph July 17, 2014  FINDINGS: Increasing interstitial and patchy RIGHT lower lobe airspace opacity. Bilateral pleural thickening without frank pleural effusion. Increased lung volumes with mildly coarsened pulmonary interstitium consistent with COPD. No pneumothorax. Cardiomediastinal silhouette is unremarkable. Surgical clips project in LEFT breast. Osseous structure nonsuspicious.  IMPRESSION: Increasing interstitial and RIGHT lung base patchy airspace opacity concerning for bronchopneumonia. Background of probable COPD.   Electronically Signed   By: Elon Alas   On: 07/25/2014 05:38   6:28 AM Patient placed on BiPAP on arrival. Venous blood gas shows pH of 7.255, PCO2 of 56.7 consistent with an acute hypercapnic respiratory acidosis.  6:34 AM Dr. Halford Chessman of Pulmonary Critical Care consulted for admission.  6:41  AM Patient more comfortable with less work of breathing on BiPAP.    Shanon Rosser, MD 07/25/14 0730

## 2014-07-25 NOTE — Progress Notes (Signed)
CARE MANAGEMENT NOTE 07/25/2014  Patient:  [facility]   Account Number:  [id number]  Date Initiated:  07/25/2014  Documentation initiated by:  DAVIS,RHONDA  Subjective/Objective Assessment:   resp distress requiring mivs/recently discharge to home on o2 on [phone]     Action/Plan:   home when stable   Anticipated DC Date:  07/28/2014   Anticipated DC Plan:  [place]  In-house referral  NA      DC Planning Services  CM consult      [facility] Choice  NA   Choice offered to / List presented to:  NA           Status of service:  In process, will continue to follow Medicare Important Message given?   (If response is "NO", the following Medicare IM given date fields will be blank) Date Medicare IM given:   Medicare IM given by:   Date Additional Medicare IM given:   Additional Medicare IM given by:    Discharge Disposition:    Per UR Regulation:  Reviewed for med. necessity/level of care/duration of stay  If discussed at [place] of Stay Meetings, dates discussed:    Comments:  July 25, 2014/Rhonda L. Rosana Hoes, RN, BSN, CCM. Case Management [place] [phone] No discharge needs present of time of review. admitted due to increased o2 demands and ams/from home on 02 from last visit/

## 2014-07-25 NOTE — Progress Notes (Signed)
*  PRELIMINARY RESULTS* Vascular Ultrasound Lower extremity venous duplex has been completed.  Preliminary findings: No evidence of DVT.   Landry Mellow, RDMS, RVT  07/25/2014, 12:54 PM

## 2014-07-25 NOTE — Consult Note (Addendum)
CARDIOLOGY CONSULT NOTE     Patient ID: Elaine Le MRN: [id number] DOB/AGE: 12/13/1960 54 y.o.  Admit date: 07/25/2014 Referring Physician Brand Males MD Primary Physician [name], MD Primary Cardiologist Darlin Coco MD Reason for Consultation CHF  HPI:  Elaine Le is a pleasant 54 yo WF seen at the request of pulmonary/CCM. She was recently DC on 07/20/14 following admission on 06/23/14 with influenza A and acute respiratory failure requiring ventilator support. During that admission she had and Echo that showed a ? Of vegetation on the MV. 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 She was doing well post DC and follow up CXR on 3/21 showed clear lung fields. She then developed progressive orthopnea with PND and felt fatigued. Her breathing was worse at night. She also had some edema. Her breathing quickly got worse and her inhalers were not helping. She was readmitted. CXR showed ? RLL PNA. She has had no fever and has minimal cough. She has improved quickly with IV diuresis. No chest pain. No known history of Rheumatic fever. She has been told she has a murmur intermittently in the past. She quit smoking one month ago. She had thoracic outlet surgery remotely by Dr. Truman Hayward.   Past Medical History  Diagnosis Date  . Chronic sinusitis   . Arthritis   . Fibromyalgia   . GERD (gastroesophageal reflux disease)   . Irritable bowel syndrome (IBS)   . Headaches, cluster   . Back pain   . Fatigue   . Muscle pain   . Night sweats   . Continuous urine leakage   . Diverticulosis   . Adenomatous colon polyp   . Internal hemorrhoids   . Hiatal hernia   . Pneumonia     Family History  Problem Relation Age of Onset  . Kidney cancer Mother   . Colon polyps Mother   . Irritable bowel syndrome Mother   . Diabetes Sister   . Liver disease Sister   . Irritable bowel syndrome Daughter   . Diabetes Brother   . Colon cancer  Neg Hx     History   Social History  . Marital Status: Single    Spouse Name: N/A  . Number of Children: 1  . Years of Education: N/A   Occupational History  . Not on file.   Social History Main Topics  . Smoking status: Former Smoker -- 0.50 packs/day for 35 years    Types: Cigarettes    Quit date: 06/17/2014  . Smokeless tobacco: Never Used  . Alcohol Use: No     Comment: rare  . Drug Use: No  . Sexual Activity: No   Other Topics Concern  . Not on file   Social History Narrative    Past Surgical History  Procedure Laterality Date  . Thoracic outlet surgery    . Tubal ligation    . Vulva surgery    . Neuroplasty / transposition median nerve at carpal tunnel bilateral    . Cystostomy w/ bladder dilation      at age 33  . Tee without cardioversion N/A 07/10/2014    Procedure: TRANSESOPHAGEAL ECHOCARDIOGRAM (TEE);  Surgeon: Lelon Perla, MD;  Location: [facility] ENDOSCOPY;  Service: Cardiovascular;  Laterality: N/A;     Prescriptions prior to admission  Medication Sig Dispense Refill Last Dose  . aspirin 81 MG tablet Take 81 mg by mouth at bedtime.    07/24/2014 at Unknown time  .  ibuprofen (ADVIL,MOTRIN) 200 MG tablet Take 400 mg by mouth every 6 (six) hours as needed (pain).    Past Week at Unknown time  . lactulose (CHRONULAC) 10 GM/15ML solution Take 10-20 g by mouth daily as needed for mild constipation. 1-2 tablespoons daily as needed   Past Week at Unknown time  . PREMARIN vaginal cream Place 1 Applicatorful vaginally daily as needed.   6 Past Month at Unknown time  . ranitidine (ZANTAC) 150 MG tablet TAKE 1 TABLET BY MOUTH EVERY DAY 60 tablet 10 07/24/2014 at Unknown time  . sodium chloride (OCEAN) 0.65 % nasal spray Place 2 sprays into the nose daily as needed for congestion (congestion).    Past Month at Unknown time  . polyethylene glycol powder (GLYCOLAX) powder Take 255 g by mouth daily as needed. (Patient taking differently: Take 1 Container by mouth daily as  needed for mild constipation. ) 255 g 12 unknown at unknown     ROS: As noted in HPI. All other systems are reviewed and are negative unless otherwise mentioned.   Physical Exam: Blood pressure 128/74, pulse 100, temperature 98.6 F (37 C), temperature source Oral, resp. rate 19, height 5' (1.524 m), weight 209 lb 3.5 oz (94.9 kg), SpO2 99 %. Current Weight  07/25/14 209 lb 3.5 oz (94.9 kg)  07/17/14 103 lb 3.2 oz (46.811 kg)  07/11/14 94 lb 4.8 oz (42.774 kg)    GENERAL:  Well appearing, thin WF in NAD HEENT:  PERRL, EOMI, sclera are clear. Oropharynx is clear. NECK:  JVD to 6 cm, carotid upstroke brisk and symmetric, no bruits, no thyromegaly or adenopathy LUNGS:  Mild crackles in bases with few scattered rhonchi. CHEST:  Unremarkable HEART:  RRR,  PMI not displaced or sustained,S1Increased. Normal S2. Grade 2/6 systolic murmur at the apex radiating to left axilla.  ABD:  Soft, nontender. BS +, no masses or bruits. No hepatomegaly, no splenomegaly EXT:  2 + pulses throughout, no edema, no cyanosis no clubbing SKIN:  Warm and dry.  No rashes NEURO:  Alert and oriented x 3. Cranial nerves II through XII intact. PSYCH:  Cognitively intact    Labs:   Lab Results  Component Value Date   WBC 13.4* 07/25/2014   HGB 12.9 07/25/2014   HCT 40.5 07/25/2014   MCV 96.2 07/25/2014   PLT 332 07/25/2014    Recent Labs Lab 07/25/14 0500  NA 142  K 3.7  CL 107  CO2 25  BUN 11  CREATININE 0.66  CALCIUM 8.9  GLUCOSE 154*   Lab Results  Component Value Date   TROPONINI 0.03 07/25/2014   TROPONINI 0.09* 06/23/2014   TROPONINI 0.05* 06/22/2014   No results found for: CHOL No results found for: HDL No results found for: [facility] Lab Results  Component Value Date   TRIG 115 06/28/2014   TRIG 85 06/23/2014   TRIG 86 06/22/2014   No results found for: CHOLHDL No results found for: LDLDIRECT  No results found for: PROBNP Lab Results  Component Value Date   TSH 3.75 01/21/2011    No results found for: HGBA1C  Radiology: Dg Chest 2 View (if Patient Has Fever And/or Copd)  07/25/2014   CLINICAL DATA:  Severe shortness of breath, wheezing beginning at 4 a.m.  EXAM: CHEST  2 VIEW  COMPARISON:  Chest radiograph July 17, 2014  FINDINGS: Increasing interstitial and patchy RIGHT lower lobe airspace opacity. Bilateral pleural thickening without frank pleural effusion. Increased lung volumes with mildly coarsened  pulmonary interstitium consistent with COPD. No pneumothorax. Cardiomediastinal silhouette is unremarkable. Surgical clips project in LEFT breast. Osseous structure nonsuspicious.  IMPRESSION: Increasing interstitial and RIGHT lung base patchy airspace opacity concerning for bronchopneumonia. Background of probable COPD.   Electronically Signed   By: Elon Alas   On: 07/25/2014 05:38    EKG: sinus tachy, incomplete RBBB. No acute changes.  Echo: 07/04/14: Study Conclusions  - Left ventricle: The cavity size was normal. Systolic function was normal. The estimated ejection fraction was in the range of 60% to 65%. Wall motion was normal; there were no regional wall motion abnormalities. - Aortic valve: Mildly calcified annulus. Trileaflet. Minimal diffuse calcification. There was trivial regurgitation. - Mitral valve: The mitral valve leaflets are thickened and there is evidence of diastolic doming of the anterior mitral valve leaftet. The posterior leaflet is thickened and appears fixed. The anterior mitral valve leaflet has a large shaggy mobile density on the LV side of the leaflet that is only appreciated in the apical 4 and 2 chamber views. It is unclear as to whether the patient has underlying rheumatic mitral stenosis and now has possible endocarditis. This needs to be evaluated further with TEE. Moderate diffuse thickening of the anterior leaflet. The findings are consistent with moderate stenosis. There was moderate  regurgitation directed eccentrically and along the left atrial wall. Valve area by pressure half-time: 1.16 cm^2.  TEE: 07/10/14:Transesophageal Echocardiography  Patient:  Reigan, Tolliver MR #:    [id number] Study Date: 07/10/2014 Gender:   F Age:    81 Height:   162.6 cm Weight:   43.2 kg BSA:    1.38 m^2 Pt. Status: Room:    [place] SONOGRAPHER Florentina Jenny, RDCS ADMITTING  Alva, Blain  Simonne Maffucci B ORDERING   [place], Christopher REFERRING  McAlhany, Christopher  cc:  ------------------------------------------------------------------- LV EF: 55% -  60%  ------------------------------------------------------------------- Indications:   424.0 Mitral valve disease.  ------------------------------------------------------------------- Study Conclusions  - Left ventricle: Systolic function was normal. The estimated ejection fraction was in the range of 55% to 60%. Wall motion was normal; there were no regional wall motion abnormalities. - Aortic valve: There was trivial regurgitation. - Mitral valve: The findings are consistent with moderate stenosis. There was severe regurgitation. Valve area by pressure half-time: 1.55 cm^2. - Left atrium: The atrium was moderately to severely dilated. - Right atrium: No evidence of thrombus in the atrial cavity or appendage. - Atrial septum: No defect or patent foramen ovale was identified. - Tricuspid valve: No evidence of vegetation. - Pulmonic valve: No evidence of vegetation.  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  Diagnostic transesophageal echocardiography. 2D and color Doppler. Birthdate: Patient birthdate: May 25, 1960. Age: Patient is 54 yr old. Sex: Gender: female.  BMI: 16.3 kg/m^2. Blood pressure: 87/51 Patient status:  Inpatient. Study date: Study date: 07/10/2014. Study time: 10:41 AM. Location: Endoscopy.  -------------------------------------------------------------------  ------------------------------------------------------------------- Left ventricle: Systolic function was normal. The estimated ejection fraction was in the range of 55% to 60%. Wall motion was normal; there were no regional wall motion abnormalities.  ------------------------------------------------------------------- Aortic valve:  Trileaflet. Doppler:  There was no stenosis. There was trivial regurgitation.  ------------------------------------------------------------------- Aorta: Descending aorta: Mild atherosclerosis.  ------------------------------------------------------------------- Mitral valve: Thickened, rheumatic mitral valve. Doming of anterior leaflet. Restricted posterior leaflet. Doppler: The findings are consistent with moderate stenosis. Gradient overestimates severity due to mitral regurgitation. There was severe  regurgitation.  Valve area by pressure half-time: 1.55 cm^2. Indexed valve area by pressure half-time: 1.12 cm^2/m^2. Mean gradient (D): 17 mm Hg.  ------------------------------------------------------------------- Left atrium: The atrium was moderately to severely dilated.  ------------------------------------------------------------------- Atrial septum: No defect or patent foramen ovale was identified.  ------------------------------------------------------------------- Right ventricle: The cavity size was normal. Systolic function was normal.  ------------------------------------------------------------------- Pulmonic valve:  Structurally normal valve.  Cusp separation was normal. No evidence of vegetation. Doppler: There was trivial regurgitation.  ------------------------------------------------------------------- Tricuspid valve:  Structurally normal  valve.  Leaflet separation was normal. No evidence of vegetation. Doppler: There was mild regurgitation.  ------------------------------------------------------------------- Right atrium: The atrium was normal in size. No evidence of thrombus in the atrial cavity or appendage.  ------------------------------------------------------------------- Pericardium: There was no pericardial effusion.  ------------------------------------------------------------------- Measurements  Mitral valve            Value Mitral mean velocity, D       197  cm/s Mitral pressure half-time      115  ms Mitral mean gradient, D       17  mm Hg Mitral valve area, PHT, DP     1.55 cm^2 Mitral valve area/bsa, PHT, DP   1.12 cm^2/m^2 Mitral annulus VTI, D        84.5 cm  Legend: (L) and (H) mark values outside specified reference range.  ------------------------------------------------------------------- Prepared and Electronically Authenticated by  Kirk Ruths 2016-03-14T18:05:47  ASSESSMENT AND PLAN:  1. Acute respiratory failure: recent PNA and influenza A. I think her current presentation may be more pulmonary edema related to RHD with MS and MR. Rapid resolution with diuretics.  2. 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  3. History of tobacco abuse. Degree of underlying COPD unclear at this point.   Plan: recommend continued IV diuresis. Will repeat a CXR in am. Follow BMET. Will start beta blocker since tachycardia may exacerbate her mitral stenosis. Will start bisoprolol 5 mg daily for selectivity. Ultimately, I think she needs a right and left heart cath to further evaluate the hemodynamic significance of her valvular disease. Will see how she responds to therapy but potentially schedule for Thursday when she is able to be supine for procedure.     Signed: Peter Martinique, [place]  07/25/2014, 2:42 PM

## 2014-07-25 NOTE — ED Notes (Signed)
Bed: WA18 Expected date:  Expected time:  Means of arrival:  Comments: 

## 2014-07-25 NOTE — ED Notes (Signed)
Patient transported to X-ray 

## 2014-07-25 NOTE — ED Notes (Signed)
Patient presents from home via POV for SOB. Reports onset earlier this morning. Patient has labored breathing with accessory muscle usage, denies pain, A&O x4.

## 2014-07-26 ENCOUNTER — Inpatient Hospital Stay (HOSPITAL_COMMUNITY): Payer: Medicare Other

## 2014-07-26 DIAGNOSIS — J9601 Acute respiratory failure with hypoxia: Secondary | ICD-10-CM

## 2014-07-26 DIAGNOSIS — J9620 Acute and chronic respiratory failure, unspecified whether with hypoxia or hypercapnia: Secondary | ICD-10-CM

## 2014-07-26 DIAGNOSIS — J9621 Acute and chronic respiratory failure with hypoxia: Secondary | ICD-10-CM | POA: Diagnosis present

## 2014-07-26 LAB — CBC
HEMATOCRIT: 36.4 % (ref 36.0–46.0)
Hemoglobin: 11.5 g/dL — ABNORMAL LOW (ref 12.0–15.0)
MCH: 30.3 pg (ref 26.0–34.0)
MCHC: 31.6 g/dL (ref 30.0–36.0)
MCV: 95.8 fL (ref 78.0–100.0)
PLATELETS: 275 10*3/uL (ref 150–400)
RBC: 3.8 MIL/uL — AB (ref 3.87–5.11)
RDW: 16.3 % — ABNORMAL HIGH (ref 11.5–15.5)
WBC: 14.1 10*3/uL — AB (ref 4.0–10.5)

## 2014-07-26 LAB — BLOOD GAS, ARTERIAL
ACID-BASE EXCESS: 2.4 mmol/L — AB (ref 0.0–2.0)
BICARBONATE: 26.5 meq/L — AB (ref 20.0–24.0)
DRAWN BY: 232811
O2 CONTENT: 2 L/min
O2 SAT: 98.4 %
PCO2 ART: 40.5 mmHg (ref 35.0–45.0)
Patient temperature: 98.1
TCO2: 24 mmol/L (ref 0–100)
pH, Arterial: 7.43 (ref 7.350–7.450)
pO2, Arterial: 111 mmHg — ABNORMAL HIGH (ref 80.0–100.0)

## 2014-07-26 LAB — BASIC METABOLIC PANEL
Anion gap: 7 (ref 5–15)
BUN: 15 mg/dL (ref 6–23)
CO2: 29 mmol/L (ref 19–32)
Calcium: 9 mg/dL (ref 8.4–10.5)
Chloride: 103 mmol/L (ref 96–112)
Creatinine, Ser: 0.63 mg/dL (ref 0.50–1.10)
GFR calc Af Amer: >90 mL/min (ref >90)
Glucose, Bld: 113 mg/dL — ABNORMAL HIGH (ref 70–99)
Potassium: 4.1 mmol/L (ref 3.5–5.1)
Sodium: 139 mmol/L (ref 135–145)

## 2014-07-26 LAB — URINE CULTURE: Colony Count: 4000

## 2014-07-26 LAB — LEGIONELLA ANTIGEN, URINE

## 2014-07-26 LAB — TROPONIN I

## 2014-07-26 LAB — MRSA PCR SCREENING: MRSA BY PCR: NEGATIVE

## 2014-07-26 MED ORDER — SODIUM CHLORIDE 0.9 % IJ SOLN: 3.0000 mL | INTRAMUSCULAR | Status: DC | PRN

## 2014-07-26 MED ORDER — SODIUM CHLORIDE 0.9 % IJ SOLN
3.0000 mL | Freq: Two times a day (BID) | INTRAMUSCULAR | Status: DC
  Administered 2014-07-26: 3 mL via INTRAVENOUS

## 2014-07-26 MED ORDER — SODIUM CHLORIDE 0.9 % IV SOLN
INTRAVENOUS | Status: DC
  Administered 2014-07-27: 06:00:00 via INTRAVENOUS

## 2014-07-26 MED ORDER — SODIUM CHLORIDE 0.9 % IV SOLN: 250.0000 mL | INTRAVENOUS | Status: DC | PRN

## 2014-07-26 MED ORDER — SALINE SPRAY 0.65 % NA SOLN
1.0000 | NASAL | Status: DC | PRN
  Administered 2014-07-26: 1 via NASAL
  Filled 2014-07-26: qty 44

## 2014-07-26 MED ORDER — PANTOPRAZOLE SODIUM 40 MG PO TBEC
40.0000 mg | DELAYED_RELEASE_TABLET | Freq: Every day | ORAL | Status: DC
  Administered 2014-07-26 – 2014-07-28 (×3): 40 mg via ORAL
  Filled 2014-07-26 (×3): qty 1

## 2014-07-26 MED ORDER — IPRATROPIUM-ALBUTEROL 0.5-2.5 (3) MG/3ML IN SOLN
3.0000 mL | Freq: Four times a day (QID) | RESPIRATORY_TRACT | Status: DC | PRN
  Administered 2014-07-26 – 2014-07-27 (×3): 3 mL via RESPIRATORY_TRACT
  Filled 2014-07-26 (×4): qty 3

## 2014-07-26 MED ORDER — ASPIRIN 81 MG PO CHEW: 81.0000 mg | CHEWABLE_TABLET | ORAL | Status: DC

## 2014-07-26 NOTE — Progress Notes (Signed)
PULMONARY / CRITICAL CARE MEDICINE   Name: Elaine Le MRN: [id number] DOB: 08/09/1960    ADMISSION DATE:  07/25/2014   REFERRING MD : EDP  CHIEF COMPLAINT:  SOB  INITIAL PRESENTATION: 54 y/o F with PMH of severe MR/MS, recent admit for Flu A / AECOPD admitted 3/29 with respiratory distress requiring bipap, exam c/w volume overload.  PCCM called for ICU admit.   STUDIES:    SIGNIFICANT EVENTS: 3/29  Admit with hypercarbia and placed on NIMVS, pulmonary edema    SUBJECTIVE:  Pt reports feeling much better, no acute complaints.  Nervous about transfer to [facility], wants to stay at [facility].    VITAL SIGNS: Temp:  [97.3 F (36.3 C)-98.6 F (37 C)] 98 F (36.7 C) (03/30 0800) Pulse Rate:  [59-103] 61 (03/30 0600) Resp:  [13-27] 16 (03/30 0600) BP: (75-134)/(42-84) 101/54 mmHg (03/30 0600) SpO2:  [93 %-100 %] 99 % (03/30 0839) Weight:  [105 lb 2.6 oz (47.7 kg)-209 lb 3.5 oz (94.9 kg)] 105 lb 2.6 oz (47.7 kg) (03/30 0500)   HEMODYNAMICS:     VENTILATOR SETTINGS:     INTAKE / OUTPUT:  Intake/Output Summary (Last 24 hours) at 07/26/14 1041 Last data filed at 07/26/14 0500  Gross per 24 hour  Intake    910 ml  Output   3200 ml  Net  -2290 ml    PHYSICAL EXAMINATION: General:  WNWDWF in NAD Neuro:  Intact, MAE HEENT:  Mild JVD, No LAN Cardiovascular:  s1s2 RRR Murmur 2/6 Lungs: resp's even/non-labored, lungs with basilar posterior crackles Abdomen:  Soft, mildly tender Musculoskeletal: intact Skin: 1+ LE edema  LABS:  CBC  Recent Labs Lab 07/25/14 0500 07/26/14 0335  WBC 13.4* 14.1*  HGB 12.9 11.5*  HCT 40.5 36.4  PLT 332 275   Coag's  Recent Labs Lab 07/25/14 0813  APTT 26  INR 1.00   BMET  Recent Labs Lab 07/25/14 0500 07/26/14 0335  NA 142 139  K 3.7 4.1  CL 107 103  CO2 25 29  BUN 11 15  CREATININE 0.66 0.63  GLUCOSE 154* 113*   Electrolytes  Recent Labs Lab 07/25/14 0500 07/25/14 0813 07/26/14 0335  CALCIUM 8.9  --  9.0  MG   --  1.9  --   PHOS  --  3.0  --    Sepsis Markers  Recent Labs Lab 07/25/14 0813  LATICACIDVEN 1.1  PROCALCITON <0.10   ABG  Recent Labs Lab 07/25/14 0800 07/26/14 0510  PHART 7.370 7.430  PCO2ART 41.0 40.5  PO2ART 109.0* 111.0*   Liver Enzymes No results for input(s): AST, ALT, ALKPHOS, BILITOT, ALBUMIN in the last 168 hours.   Cardiac Enzymes  Recent Labs Lab 07/25/14 1300 07/25/14 2015 07/26/14 0335  TROPONINI 0.03 0.03 <0.03   Glucose No results for input(s): GLUCAP in the last 168 hours.  Imaging Dg Chest 2 View (if Patient Has Fever And/or Copd)  07/25/2014   CLINICAL DATA:  Severe shortness of breath, wheezing beginning at 4 a.m.  EXAM: CHEST  2 VIEW  COMPARISON:  Chest radiograph July 17, 2014  FINDINGS: Increasing interstitial and patchy RIGHT lower lobe airspace opacity. Bilateral pleural thickening without frank pleural effusion. Increased lung volumes with mildly coarsened pulmonary interstitium consistent with COPD. No pneumothorax. Cardiomediastinal silhouette is unremarkable. Surgical clips project in LEFT breast. Osseous structure nonsuspicious.  IMPRESSION: Increasing interstitial and RIGHT lung base patchy airspace opacity concerning for bronchopneumonia. Background of probable COPD.   Electronically Signed   By:  Courtnay  Bloomer   On: 07/25/2014 05:38     ASSESSMENT / PLAN:  PULMONARY OETT A: Pulmonary Edema - significantly improved with diuresis  Presumed emphysema - PFT pending, just quit smoking Recent Respiratory Failure in setting of Flu A COPD P:   O2 as needed NIMV PRN  Trend CXR Change BD's to Q6 PRN No steroids (not wheezing on exam)  CARDIOVASCULAR CVL A: Severe MR Volume overload P:  Lasix 40 mg BID + KCL Trend BNP Cardiology input appreciated  Planned transfer to [facility] in am 3/31 for [facility]  RENAL A:  No acute issue P:   Trend BMP Replace electrolytes as indicated   GASTROINTESTINAL A:  GI protection P:    PPI  HEMATOLOGIC A:   No acute issue P:  Trend BMP DVT: Heparin SQ   INFECTIOUS A:   No evidence of acute infection  P:   3/21 BC x 2 >> 3/21 UC >> neg 3/21 procal >> neg 3/21 resp viral >> P:   Monitor fever curve / WBC  NEUROLOGIC A:   Anxiety P:   Supportive care, may need mild anxiolytic    FAMILY  - Updates: Patient & mother updated at bedside 3/30    Noe Gens, NP-C [place] Pgr: [phone] or [phone]   07/26/2014, 10:41 AM

## 2014-07-26 NOTE — Progress Notes (Signed)

## 2014-07-26 NOTE — Progress Notes (Addendum)
    Subjective:  Denies CP; dyspnea improving   Objective:  Filed Vitals:   07/26/14 0530 07/26/14 0600 07/26/14 0800 07/26/14 0839  BP: 89/48 101/54    Pulse: 61 61    Temp:   98 F (36.7 C)   TempSrc:   Oral   Resp: 15 16    Height:      Weight:      SpO2: 100% 99%  99%    Intake/Output from previous day:  Intake/Output Summary (Last 24 hours) at 07/26/14 0906 Last data filed at 07/26/14 0500  Gross per 24 hour  Intake    910 ml  Output   3200 ml  Net  -2290 ml    Physical Exam: Physical exam: Well-developed well-nourished in no acute distress.  Skin is warm and dry.  HEENT is normal.  Neck is supple.  Chest is clear to auscultation with normal expansion.  Cardiovascular exam is regular rate and rhythm. 2/6 systolic murmur apex Abdominal exam nontender or distended. No masses palpated. Extremities show no edema. neuro grossly intact    Lab Results: Basic Metabolic Panel:  Recent Labs  07/25/14 0500 07/25/14 0813 07/26/14 0335  NA 142  --  139  K 3.7  --  4.1  CL 107  --  103  CO2 25  --  29  GLUCOSE 154*  --  113*  BUN 11  --  15  CREATININE 0.66  --  0.63  CALCIUM 8.9  --  9.0  MG  --  1.9  --   PHOS  --  3.0  --    CBC:  Recent Labs  07/25/14 0500 07/26/14 0335  WBC 13.4* 14.1*  NEUTROABS 7.2  --   HGB 12.9 11.5*  HCT 40.5 36.4  MCV 96.2 95.8  PLT 332 275   Cardiac Enzymes:  Recent Labs  07/25/14 1300 07/25/14 2015 07/26/14 0335  TROPONINI 0.03 0.03 <0.03     Assessment/Plan:  1. Acute respiratory failure: recent PNA and influenza A. Now with pulmonary edema related to RHD with MS and MR.  2. 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  3. History of tobacco abuse. Degree of underlying COPD unclear at this point.   Plan: recommend continued IV diuresis. Follow renal function. DC bisoprolol as BP borderline. Right and left heart cath to further evaluate the hemodynamic significance of her valvular disease in AM  (risks and benefits discussed and pt agrees to proceed). Will need CVTS consult for MVR. Would keep at [facility] tomorrow following cath.  Kirk Ruths 07/26/2014, 9:06 AM

## 2014-07-27 ENCOUNTER — Encounter (HOSPITAL_COMMUNITY)
Admission: EM | Disposition: A | Discharge status: Home / Self Care | Payer: Medicare Other | Source: Home / Self Care | Attending: Cardiology

## 2014-07-27 ENCOUNTER — Other Ambulatory Visit: Payer: Self-pay | Admitting: *Deleted

## 2014-07-27 ENCOUNTER — Inpatient Hospital Stay (HOSPITAL_COMMUNITY): Payer: Medicare Other

## 2014-07-27 ENCOUNTER — Other Ambulatory Visit: Payer: Self-pay

## 2014-07-27 ENCOUNTER — Encounter (HOSPITAL_COMMUNITY): Payer: Self-pay | Admitting: General Practice

## 2014-07-27 DIAGNOSIS — I340 Nonrheumatic mitral (valve) insufficiency: Secondary | ICD-10-CM

## 2014-07-27 DIAGNOSIS — I342 Nonrheumatic mitral (valve) stenosis: Secondary | ICD-10-CM

## 2014-07-27 HISTORY — PX: LEFT AND RIGHT HEART CATHETERIZATION WITH CORONARY ANGIOGRAM: SHX5449

## 2014-07-27 LAB — POCT I-STAT 3, ART BLOOD GAS (G3+)
Acid-base deficit: 1 mmol/L (ref 0.0–2.0)
Bicarbonate: 23.8 mEq/L (ref 20.0–24.0)
O2 Saturation: 100 %
PH ART: 7.374 (ref 7.350–7.450)
PO2 ART: 185 mmHg — AB (ref 80.0–100.0)
TCO2: 25 mmol/L (ref 0–100)
pCO2 arterial: 40.8 mmHg (ref 35.0–45.0)

## 2014-07-27 LAB — BASIC METABOLIC PANEL
ANION GAP: 8 (ref 5–15)
BUN: 20 mg/dL (ref 6–23)
CALCIUM: 8.6 mg/dL (ref 8.4–10.5)
CO2: 29 mmol/L (ref 19–32)
CREATININE: 0.62 mg/dL (ref 0.50–1.10)
Chloride: 102 mmol/L (ref 96–112)
GFR calc Af Amer: >90 mL/min (ref >90)
Glucose, Bld: 111 mg/dL — ABNORMAL HIGH (ref 70–99)
Potassium: 4 mmol/L (ref 3.5–5.1)
Sodium: 139 mmol/L (ref 135–145)

## 2014-07-27 LAB — POCT I-STAT 3, VENOUS BLOOD GAS (G3P V)
Bicarbonate: 26.2 mEq/L — ABNORMAL HIGH (ref 20.0–24.0)
O2 Saturation: 77 %
PCO2 VEN: 46.9 mmHg (ref 45.0–50.0)
TCO2: 28 mmol/L (ref 0–100)
pH, Ven: 7.354 — ABNORMAL HIGH (ref 7.250–7.300)
pO2, Ven: 44 mmHg (ref 30.0–45.0)

## 2014-07-27 LAB — RESPIRATORY VIRUS PANEL
Adenovirus: NEGATIVE
Influenza A: NEGATIVE
Influenza B: NEGATIVE
METAPNEUMOVIRUS: NEGATIVE
PARAINFLUENZA 1 A: NEGATIVE
PARAINFLUENZA 3 A: NEGATIVE
Parainfluenza 2: NEGATIVE
RESPIRATORY SYNCYTIAL VIRUS A: NEGATIVE
RHINOVIRUS: NEGATIVE
Respiratory Syncytial Virus B: NEGATIVE

## 2014-07-27 LAB — CBC
HCT: 36.2 % (ref 36.0–46.0)
Hemoglobin: 11.6 g/dL — ABNORMAL LOW (ref 12.0–15.0)
MCH: 30.4 pg (ref 26.0–34.0)
MCHC: 32 g/dL (ref 30.0–36.0)
MCV: 94.8 fL (ref 78.0–100.0)
Platelets: 282 10*3/uL (ref 150–400)
RBC: 3.82 MIL/uL — ABNORMAL LOW (ref 3.87–5.11)
RDW: 16.4 % — AB (ref 11.5–15.5)
WBC: 9.4 10*3/uL (ref 4.0–10.5)

## 2014-07-27 LAB — TROPONIN I: Troponin I: <0.03 ng/mL (ref <0.031)

## 2014-07-27 SURGERY — LEFT AND RIGHT HEART CATHETERIZATION WITH CORONARY ANGIOGRAM
Anesthesia: LOCAL

## 2014-07-27 MED ORDER — SODIUM CHLORIDE 0.9 % IV SOLN: INTRAVENOUS | Status: AC

## 2014-07-27 NOTE — Progress Notes (Addendum)
TCTS BRIEF PROGRESS NOTE   Patient seen and examined, chart, ECHO's and cath reviewed.  Patient will ultimately need elective mitral valve replacement, and she appears to be a candidate for a minimally invasive approach.  The primary question at this point is the timing of surgery given her hospitalization last month with influenza A and respiratory failure.  She has long h/o tobacco abuse and recently quit smoking.  She may have COPD and will need PFT's done.  She is currently on droplet precautions.    Full consult note to follow.  Rexene Alberts 07/27/2014 6:28 PM

## 2014-07-27 NOTE — Interval H&P Note (Signed)
History and Physical Interval Note:  07/27/2014 9:05 AM  Elaine Le  has presented today for cardiac cath with the diagnosis of severe mitral regurgitation due to rheumatic heart disease. The various methods of treatment have been discussed with the patient and family. After consideration of risks, benefits and other options for treatment, the patient has consented to  Procedure(s): LEFT AND RIGHT HEART CATHETERIZATION WITH CORONARY ANGIOGRAM (N/A) as a surgical intervention .  The patient's history has been reviewed, patient examined, no change in status, stable for surgery.  I have reviewed the patient's chart and labs.  Questions were answered to the patient's satisfaction.    No PCI will be performed today.   MCALHANY,CHRISTOPHER

## 2014-07-27 NOTE — Progress Notes (Signed)
Care link here for pick up and transport to [facility] for cardiac cath. Pt is alert and oriented times 4, VSS, no c/o pain, right groin and wrist have been clipped for procedure, pt has been NPO since MN, report given to care link along with all necessary paperwork, consent was signed by the pt and myself and also put into paperwork packet to take to [facility]. Family is here at the bedside and will meet pt at facility. All personal belongings of the pts have been sent with her family.

## 2014-07-27 NOTE — H&P (View-Only) (Signed)
    Subjective:  Denies CP; dyspnea improving   Objective:  Filed Vitals:   07/26/14 0530 07/26/14 0600 07/26/14 0800 07/26/14 0839  BP: 89/48 101/54    Pulse: 61 61    Temp:   98 F (36.7 C)   TempSrc:   Oral   Resp: 15 16    Height:      Weight:      SpO2: 100% 99%  99%    Intake/Output from previous day:  Intake/Output Summary (Last 24 hours) at 07/26/14 0906 Last data filed at 07/26/14 0500  Gross per 24 hour  Intake    910 ml  Output   3200 ml  Net  -2290 ml    Physical Exam: Physical exam: Well-developed well-nourished in no acute distress.  Skin is warm and dry.  HEENT is normal.  Neck is supple.  Chest is clear to auscultation with normal expansion.  Cardiovascular exam is regular rate and rhythm. 2/6 systolic murmur apex Abdominal exam nontender or distended. No masses palpated. Extremities show no edema. neuro grossly intact    Lab Results: Basic Metabolic Panel:  Recent Labs  07/25/14 0500 07/25/14 0813 07/26/14 0335  NA 142  --  139  K 3.7  --  4.1  CL 107  --  103  CO2 25  --  29  GLUCOSE 154*  --  113*  BUN 11  --  15  CREATININE 0.66  --  0.63  CALCIUM 8.9  --  9.0  MG  --  1.9  --   PHOS  --  3.0  --    CBC:  Recent Labs  07/25/14 0500 07/26/14 0335  WBC 13.4* 14.1*  NEUTROABS 7.2  --   HGB 12.9 11.5*  HCT 40.5 36.4  MCV 96.2 95.8  PLT 332 275   Cardiac Enzymes:  Recent Labs  07/25/14 1300 07/25/14 2015 07/26/14 0335  TROPONINI 0.03 0.03 <0.03     Assessment/Plan:  1. Acute respiratory failure: recent PNA and influenza A. Now with pulmonary edema related to RHD with MS and MR.  2. 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  3. History of tobacco abuse. Degree of underlying COPD unclear at this point.   Plan: recommend continued IV diuresis. Follow renal function. DC bisoprolol as BP borderline. Right and left heart cath to further evaluate the hemodynamic significance of her valvular disease in AM  (risks and benefits discussed and pt agrees to proceed). Will need CVTS consult for MVR. Would keep at [facility] tomorrow following cath.  Kirk Ruths 07/26/2014, 9:06 AM

## 2014-07-27 NOTE — Progress Notes (Signed)
Site area:right groin a 5 french arterial and 7 french venous sheath was removed  Site Prior to Removal:  Level 0  Pressure Applied For 15 MINUTES    Minutes Beginning at 1040am  Manual:   Yes.    Patient Status During Pull:  stable  Post Pull Groin Site:  Level 0  Post Pull Instructions Given:  Yes.    Post Pull Pulses Present:  Yes.    Dressing Applied:  Yes.    Comments:  VS remain stable during sheath pull.  Pt denies any discomfort at site at this time.

## 2014-07-27 NOTE — Progress Notes (Signed)
Pt currently on RA sats 100%. Bipap not in room at this time.

## 2014-07-27 NOTE — CV Procedure (Signed)
      Cardiac Catheterization Operative Report  Elaine Le [id number] 3/31/20169:14 AM [name], MD  Procedure Performed:  1. Left Heart Catheterization 2. Selective Coronary Angiography 3. Right Heart Catheterization 4. Left ventricular angiogram  Operator: Lauree Chandler, MD  Indication:  54 yo female with history of rheumatic heart disease with moderate MS and severe MR.                                 Procedure Details: The risks, benefits, complications, treatment options, and expected outcomes were discussed with the patient. The patient and/or family concurred with the proposed plan, giving informed consent. The patient was brought to the cath lab after IV hydration was begun and oral premedication was given. The patient was further sedated with Versed and Fentanyl. The right groin was prepped and draped in the usual manner. Using the modified Seldinger access technique, a 5 French sheath was placed in the right femoral artery. A 7 French sheath was inserted into the right femoral vein. A balloon tipped catheter was used to perform a right heart catheterization. Standard diagnostic catheters were used to perform selective coronary angiography. A pigtail catheter was used to perform a left ventricular angiogram. There were no immediate complications. The patient was taken to the recovery area in stable condition.   Hemodynamic Findings: Ao:   85/47             LV: 79/4/11 RA:  12             RV: 50/7/17 PA: 48/29 (mean 38) PCWP: 33  Fick Cardiac Output: 5.2 L/min Fick Cardiac Index: 3.67 L/min/m2 Central Aortic Saturation: 100% Pulmonary Artery Saturation: 77%  Angiographic Findings:  Left main: Short segment without obstructive disease.   Left Anterior Descending Artery: Large caliber vessel that courses to the apex. The proximal and mid vessel has diffuse 20% stenosis.   Circumflex Artery: Moderate caliber vessel with moderate caliber intermediate  branch. The intermediate branch is free of obstructive disease. The AV groove Circumflex has a distal 20% stenosis.   Right Coronary Artery: Moderate caliber co-dominant vessel with no focally obstructive disease. There is intense spasm noted in the proximal vessel with catheter engagement that resolves with IC NTG.   Left Ventricular Angiogram: LVEF=65-70%. 3+ mitral regurgitation noted.   Impression: 1. Mild non-obstructive CAD 2. Normal LV systolic function 3. Severe mitral regurgitation leading to elevated filling pressures  Recommendations: Will ask CT surgery to evaluate for possible MV surgery. She will be kept as in an inpatient at [facility].        Complications:  None; patient tolerated the procedure well.

## 2014-07-27 NOTE — Progress Notes (Signed)
PULMONARY / CRITICAL CARE MEDICINE   Name: Elaine Le MRN: [id number] DOB: [birth year]    ADMISSION DATE:  07/25/2014   REFERRING MD : EDP  CHIEF COMPLAINT:  SOB  INITIAL PRESENTATION: 54 y/o F with PMH of severe MR/MS, recent admit for Flu A / AECOPD admitted 3/29 with respiratory distress requiring bipap, exam c/w volume overload.  PCCM called for ICU admit.   STUDIES:    SIGNIFICANT EVENTS: 3/29  Admit with hypercarbia and placed on NIMVS, pulmonary edema  3/31 tx to [facility] for [facility]  SUBJECTIVE:  Pt reports feeling much better, no acute complaints.  Nervous about transfer to [facility], wants to stay at [facility].    VITAL SIGNS: Temp:  [97.1 F (36.2 C)-98.6 F (37 C)] 98.2 F (36.8 C) (03/31 0755) Pulse Rate:  [75-87] 82 (03/31 0437) Resp:  [16-22] 17 (03/31 0437) BP: (88-116)/(41-64) 88/54 mmHg (03/31 0437) SpO2:  [97 %-100 %] 100 % (03/31 0437) Weight:  [107 lb 12.9 oz (48.9 kg)] 107 lb 12.9 oz (48.9 kg) (03/31 0148)   HEMODYNAMICS:     VENTILATOR SETTINGS:     INTAKE / OUTPUT:  Intake/Output Summary (Last 24 hours) at 07/27/14 0805 Last data filed at 07/26/14 2300  Gross per 24 hour  Intake   1080 ml  Output      0 ml  Net   1080 ml    PHYSICAL EXAMINATION: General:  WNWDWF in NAD Neuro:  Intact, MAE HEENT:  Mild JVD, No LAN Cardiovascular:  s1s2 RRR Murmur 2/6 Lungs: resp's even/non-labored Abdomen:  Soft, mildly tender Musculoskeletal: intact Skin: 1+ LE edema  LABS:  CBC  Recent Labs Lab 07/25/14 0500 07/26/14 0335 07/27/14 0335  WBC 13.4* 14.1* 9.4  HGB 12.9 11.5* 11.6*  HCT 40.5 36.4 36.2  PLT 332 275 282   Coag's  Recent Labs Lab 07/25/14 0813  APTT 26  INR 1.00   BMET  Recent Labs Lab 07/25/14 0500 07/26/14 0335 07/27/14 0335  NA 142 139 139  K 3.7 4.1 4.0  CL 107 103 102  CO2 25 29 29   BUN 11 15 20   CREATININE 0.66 0.63 0.62  GLUCOSE 154* 113* 111*   Electrolytes  Recent Labs Lab 07/25/14 0500 07/25/14 0813  07/26/14 0335 07/27/14 0335  CALCIUM 8.9  --  9.0 8.6  MG  --  1.9  --   --   PHOS  --  3.0  --   --    Sepsis Markers  Recent Labs Lab 07/25/14 0813  LATICACIDVEN 1.1  PROCALCITON <0.10   ABG  Recent Labs Lab 07/25/14 0800 07/26/14 0510  PHART 7.370 7.430  PCO2ART 41.0 40.5  PO2ART 109.0* 111.0*   Liver Enzymes No results for input(s): AST, ALT, ALKPHOS, BILITOT, ALBUMIN in the last 168 hours.   Cardiac Enzymes  Recent Labs Lab 07/25/14 2015 07/26/14 0335 07/27/14 0335  TROPONINI 0.03 <0.03 <0.03   Glucose No results for input(s): GLUCAP in the last 168 hours.  Imaging Dg Chest Port 1 View  07/26/2014   CLINICAL DATA:  CHF.  EXAM: PORTABLE CHEST - 1 VIEW  COMPARISON:  07/25/2014.  FINDINGS: Mediastinum hilar structures are normal. Heart size stable. Interim partial clearing of bilateral pulmonary interstitial infiltrates/edema. Persistent small pleural effusions. No pneumothorax. No acute bony abnormality. Surgical clips left chest.  IMPRESSION: Interim partial clearing of bilateral pulmonary interstitial infiltrates/edema. Small bilateral pleural effusions remain .   Electronically Signed   By: Marcello Moores  Register   On: 07/26/2014 08:02  ASSESSMENT / PLAN:  PULMONARY OETT A: Pulmonary Edema - significantly improved with diuresis  Presumed emphysema - PFT pending, just quit smoking Recent Respiratory Failure in setting of Flu A COPD P:   O2 as needed NIMV PRN  Trend CXR Change BD's to Q6 PRN No steroids (not wheezing on exam)  CARDIOVASCULAR CVL A: Severe MR Volume overload P:  Lasix 40 mg BID + KCL Trend BNP Cardiology input appreciated   Transfer to [facility] in am 3/31 for [facility] to assume primary role , PCCM will sign off.  RENAL A:  No acute issue P:   Trend BMP Replace electrolytes as indicated   GASTROINTESTINAL A:  GI protection P:   PPI  HEMATOLOGIC A:   No acute issue P:  Trend BMP DVT: Heparin SQ   INFECTIOUS A:    No evidence of acute infection  P:   3/21 BC x 2 >>neg 3/21 UC >> neg 3/21 procal >> neg 3/21 resp viral >> P:   Monitor fever curve / WBC  NEUROLOGIC A:   Anxiety P:   Supportive care, may need mild anxiolytic    FAMILY  - Updates: Patient & mother updated at bedside 3/31    [facility] ACNP Maryanna Shape PCCM Pager [phone] till 3 pm If no answer page [phone] 07/27/2014, 8:05 AM

## 2014-07-28 ENCOUNTER — Encounter (HOSPITAL_COMMUNITY): Payer: Self-pay | Admitting: Thoracic Surgery (Cardiothoracic Vascular Surgery)

## 2014-07-28 ENCOUNTER — Encounter (HOSPITAL_COMMUNITY): Payer: Medicare Other

## 2014-07-28 DIAGNOSIS — Z720 Tobacco use: Secondary | ICD-10-CM | POA: Diagnosis present

## 2014-07-28 DIAGNOSIS — J9621 Acute and chronic respiratory failure with hypoxia: Secondary | ICD-10-CM

## 2014-07-28 DIAGNOSIS — I5033 Acute on chronic diastolic (congestive) heart failure: Secondary | ICD-10-CM

## 2014-07-28 DIAGNOSIS — I340 Nonrheumatic mitral (valve) insufficiency: Secondary | ICD-10-CM

## 2014-07-28 DIAGNOSIS — I509 Heart failure, unspecified: Secondary | ICD-10-CM

## 2014-07-28 DIAGNOSIS — R0902 Hypoxemia: Secondary | ICD-10-CM

## 2014-07-28 DIAGNOSIS — Z87891 Personal history of nicotine dependence: Secondary | ICD-10-CM | POA: Diagnosis present

## 2014-07-28 DIAGNOSIS — G894 Chronic pain syndrome: Secondary | ICD-10-CM | POA: Diagnosis present

## 2014-07-28 DIAGNOSIS — I2510 Atherosclerotic heart disease of native coronary artery without angina pectoris: Secondary | ICD-10-CM | POA: Diagnosis present

## 2014-07-28 LAB — TROPONIN I: Troponin I: 0.04 ng/mL — ABNORMAL HIGH (ref <0.031)

## 2014-07-28 LAB — BASIC METABOLIC PANEL
Anion gap: 9 (ref 5–15)
BUN: 12 mg/dL (ref 6–23)
CO2: 30 mmol/L (ref 19–32)
Calcium: 10 mg/dL (ref 8.4–10.5)
Chloride: 97 mmol/L (ref 96–112)
Creatinine, Ser: 0.55 mg/dL (ref 0.50–1.10)
GFR calc Af Amer: >90 mL/min (ref >90)
GFR calc non Af Amer: >90 mL/min (ref >90)
Glucose, Bld: 118 mg/dL — ABNORMAL HIGH (ref 70–99)
Potassium: 4.2 mmol/L (ref 3.5–5.1)
Sodium: 136 mmol/L (ref 135–145)

## 2014-07-28 MED ORDER — COUMADIN BOOK
Freq: Once | Status: AC
  Administered 2014-07-28: 19:00:00
  Filled 2014-07-28: qty 1

## 2014-07-28 MED ORDER — POTASSIUM CHLORIDE CRYS ER 20 MEQ PO TBCR
20.0000 meq | EXTENDED_RELEASE_TABLET | Freq: Every day | ORAL | Status: DC
  Administered 2014-07-29: 20 meq via ORAL
  Filled 2014-07-28: qty 1

## 2014-07-28 MED ORDER — FUROSEMIDE 40 MG PO TABS
40.0000 mg | ORAL_TABLET | Freq: Every day | ORAL | Status: DC
  Administered 2014-07-28 – 2014-07-29 (×2): 40 mg via ORAL
  Filled 2014-07-28 (×2): qty 1

## 2014-07-28 MED FILL — Heparin Sodium (Porcine) 2 Unit/ML in Sodium Chloride 0.9%: INTRAMUSCULAR | Qty: 1000 | Status: AC

## 2014-07-28 MED FILL — Fentanyl Citrate Inj 0.05 MG/ML: INTRAMUSCULAR | Qty: 2 | Status: AC

## 2014-07-28 MED FILL — Midazolam HCl Inj 2 MG/2ML (Base Equivalent): INTRAMUSCULAR | Qty: 2 | Status: AC

## 2014-07-28 MED FILL — Lidocaine HCl Local Preservative Free (PF) Inj 1%: INTRAMUSCULAR | Qty: 30 | Status: AC

## 2014-07-28 MED FILL — Nitroglycerin IV Soln 100 MCG/ML in D5W: INTRA_ARTERIAL | Qty: 10 | Status: AC

## 2014-07-28 NOTE — Consult Note (Signed)
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  The patient has no previous history of cardiac disease and had never been told that she had a heart murmur in the past. She denies any known history of rheumatic fever in the past.  She has a long-standing history of tobacco abuse with suspected COPD, fibromyalgia with chronic pain, irritable bowel syndrome with chronic constipation, and chronic fatigue.  She was admitted to [facility] on 06/23/2014 with acute respiratory failure with hypercapnia requiring ventilator support. She was diagnosed with influenza A and community acquired pneumonia.  The patient was quite ill at the time of her initial presentation and required intravenous pressors for blood pressure support in addition to mechanical ventilation.  Her subsequent hospitalization and convalescence was slow.  She was initially extubated on 06/28/2014 but required reintubation later that same day.  She was extubated a second time on 06/30/2014 and required BiPAP support for the next several days.  A transthoracic echocardiogram was performed 07/04/2014 that revealed normal left ventricular systolic function with ejection fraction estimated 60-65%. There was a rheumatic appearing mitral valve with a "shaggy density" on the anterior leaflet of the mitral valve raising the question of possible vegetation. The patient was seen in  consultation by Dr. Mare Le from the cardiology team, and transesophageal echocardiogram was recommended.  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 There was no vegetation seen.  Outpatient cardiology follow-up was planned.  The patient was subsequently discharged from the hospital on 07/11/2014.  The patient was not discharged from the hospital on any diuretics or other medications related to her underlying heart disease.  Over the subsequent 2 weeks the patient reports the development of gradual progression of shortness of breath, orthopnea, and bilateral lower extremity edema. She also had some flulike symptoms with nonproductive cough and atypical chest pain. Symptoms became acutely worse and the patient presented to the emergency department 07/25/2014.  Chest x-ray showed possible right lower lobe pneumonia but symptoms improved rapidly with intravenous diuresis and bronchodilator therapy.  The patient was transferred to [facility] where she underwent left and right heart catheterization on 07/27/2014 by Dr. Angelena Le. The patient was found to have normal coronary artery anatomy with non-obstructive coronary artery disease.  There was normal left ventricular systolic function with elevated left heart filling pressures and pulmonary hypertension. Cardiothoracic surgical consultation was requested.  The patient is single and lives alone in [place]. She has been disabled for more than 20 years because of fibromyalgia with chronic pain and fatigue. She requires long-term narcotic pain relievers. She lives a somewhat sedentary lifestyle but she reports no particular physical limitations other than that caused by fatigue and chronic pain. She has a long-standing history of exertional shortness of breath. She reports occasional tightness across her chest that seems to develop sporadically and is not  related to  physical activity. She has occasional dizzy spells without palpitations or syncope. She has only recently developed severe exertional shortness of breath, PND, orthopnea, and lower extremity edema since her recent hospitalization. With diuretic therapy over the last 2-3 days she feels much better. She currently reports no shortness of breath. She can lay flat in bed. She has not smoked any cigarettes since she was discharged from the hospital 2 weeks ago.  Past Medical History  Diagnosis Date  . Chronic sinusitis   . Arthritis   . Fibromyalgia   . GERD (gastroesophageal reflux disease)   . Irritable bowel syndrome (IBS)   . Headaches, cluster   . Back pain   . Fatigue   . Muscle pain   . Night sweats   . Continuous urine leakage   . Diverticulosis   . Adenomatous colon polyp   . Internal hemorrhoids   . Hiatal hernia   . Pneumonia   . Mitral valve regurgitation     severe  . COPD (chronic obstructive pulmonary disease)-PFT pending  07/17/2014  . Community acquired pneumonia   . CN (constipation) 05/25/2014  . Acute on chronic respiratory failure, unspecified whether with hypoxia or hypercapnia   . Acute respiratory acidosis   . Acute respiratory failure with hypercapnia 06/21/2014  . Acute respiratory failure with hypoxia   . Hx of adenomatous colonic polyps 03/10/2011    Oct 2012, repeat colon Oct 2017   . IBS (irritable bowel syndrome) 01/21/2011  . Influenza A 06/29/2014  . Mitral regurgitation 07/11/2014  . Mitral valve stenosis, moderate 07/11/2014  . Muscular deconditioning 07/05/2014  . Pleural effusion   . Protein-calorie malnutrition, severe 07/06/2014  . Chronic pain syndrome   . Tobacco abuse     Past Surgical History  Procedure Laterality Date  . Thoracic outlet surgery    . Tubal ligation    . Vulva surgery    . Neuroplasty / transposition median nerve at carpal tunnel bilateral    . Cystostomy w/ bladder dilation      at age 89  . Tee without cardioversion N/A  07/10/2014    Procedure: TRANSESOPHAGEAL ECHOCARDIOGRAM (TEE);  Surgeon: Lelon Perla, MD;  Location: [facility] ENDOSCOPY;  Service: Cardiovascular;  Laterality: N/A;  . Cardiac catheterization  07/27/2014  . Left and right heart catheterization with coronary angiogram N/A 07/27/2014    Procedure: LEFT AND RIGHT HEART CATHETERIZATION WITH CORONARY ANGIOGRAM;  Surgeon: Elaine Blanks, MD;  Location: [facility] CATH LAB;  Service: Cardiovascular;  Laterality: N/A;    Family History  Problem Relation Age of Onset  . Kidney cancer Mother   . Colon polyps Mother   . Irritable bowel syndrome Mother   . Diabetes Sister   . Liver disease Sister   . Irritable bowel syndrome Daughter   . Diabetes Brother   . Colon cancer Neg Hx     History   Social History  . Marital Status: Single    Spouse Name: N/A  . Number of Children: 1  . Years of Education: N/A   Occupational History  . Not on file.   Social History Main Topics  . Smoking status: Former Smoker -- 0.50 packs/day for 35 years    Types: Cigarettes    Quit date: 06/17/2014  . Smokeless tobacco: Never Used  . Alcohol Use: No     Comment: rare  . Drug Use: No  . Sexual Activity: No   Other Topics Concern  . Not on file  Social History Narrative    Prior to Admission medications   Medication Sig Start Date End Date Taking? Authorizing Provider  aspirin 81 MG tablet Take 81 mg by mouth at bedtime.    Yes Historical Provider, MD  ibuprofen (ADVIL,MOTRIN) 200 MG tablet Take 400 mg by mouth every 6 (six) hours as needed (pain).    Yes Historical Provider, MD  lactulose (CHRONULAC) 10 GM/15ML solution Take 10-20 g by mouth daily as needed for mild constipation. 1-2 tablespoons daily as needed   Yes Historical Provider, MD  PREMARIN vaginal cream Place 1 Applicatorful vaginally daily as needed.  05/24/14  Yes Historical Provider, MD  ranitidine (ZANTAC) 150 MG tablet TAKE 1 TABLET BY MOUTH EVERY DAY 05/25/14  Yes Jerene Bears, MD    sodium chloride (OCEAN) 0.65 % nasal spray Place 2 sprays into the nose daily as needed for congestion (congestion).    Yes Historical Provider, MD  polyethylene glycol powder (GLYCOLAX) powder Take 255 g by mouth daily as needed. Patient taking differently: Take 1 Container by mouth daily as needed for mild constipation.  07/11/14   Grace Bushy Minor, NP    Current Facility-Administered Medications  Medication Dose Route Frequency Provider Last Rate Last Dose  . acetaminophen (TYLENOL) tablet 650 mg  650 mg Oral Q4H PRN Grace Bushy Minor, NP   650 mg at 07/27/14 1259  . antiseptic oral rinse (CPC / CETYLPYRIDINIUM CHLORIDE 0.05%) solution 7 mL  7 mL Mouth Rinse BID Brand Males, MD   7 mL at 07/27/14 2104  . furosemide (LASIX) injection 40 mg  40 mg Intravenous Q12H Brand Males, MD   40 mg at 07/28/14 0031  . ipratropium-albuterol (DUONEB) 0.5-2.5 (3) MG/3ML nebulizer solution 3 mL  3 mL Nebulization Q6H PRN Donita Brooks, NP   3 mL at 07/27/14 0116  . pantoprazole (PROTONIX) EC tablet 40 mg  40 mg Oral QHS Dara Hoyer, RPH   40 mg at 07/27/14 2104  . potassium chloride SA (K-DUR,KLOR-CON) CR tablet 20 mEq  20 mEq Oral BID Brand Males, MD   20 mEq at 07/27/14 2104  . sodium chloride (OCEAN) 0.65 % nasal spray 1 spray  1 spray Each Nare PRN Vilinda Boehringer, MD   1 spray at 07/26/14 2258    Allergies  Allergen Reactions  . Morphine And Related     Altered mental state      Review of Systems:   General:  normal appetite, decreased energy, + weight gain prior to admission, + weight loss with diuresis, no fever  Cardiac:  no chest pain with exertion, occasional brief episodes of chest pain at rest, + SOB with exertion, + resting SOB at presentation but none presently, + PND, + orthopnea, no palpitations, no arrhythmia, no atrial fibrillation, + LE edema, + dizzy spells, no syncope  Respiratory:  + shortness of breath, no home oxygen, no productive cough, + dry cough, no  bronchitis, + wheezing, no hemoptysis, no asthma, no pain with inspiration or cough, no sleep apnea, no CPAP at night  GI:   no difficulty swallowing, no reflux, no frequent heartburn, no hiatal hernia, no abdominal pain, + chronic constipation, no diarrhea, no hematochezia, no hematemesis, no melena  GU:   no dysuria,  no frequency, no urinary tract infection, no hematuria, no kidney stones, no kidney disease  Vascular:  no pain suggestive of claudication, no pain in feet, no leg cramps, no varicose veins, no DVT, no non-healing foot ulcer  Neuro:  no stroke, no TIA's, no seizures, no headaches, no temporary blindness one eye,  no slurred speech, no peripheral neuropathy, + chronic pain, no instability of gait, no memory/cognitive dysfunction  Musculoskeletal: mild arthritis in hips, no joint swelling, + chronic myalgias, no difficulty walking, normal mobility   Skin:   no rash, no itching, no skin infections, no pressure sores or ulcerations  Psych:   + anxiety, no depression, + nervousness, no unusual recent stress  Eyes:   no blurry vision, no floaters, no recent vision changes, does not wears glasses or contacts  ENT:   no hearing loss, no loose or painful teeth, no dentures, last saw dentist within the past 4 months  Hematologic:  + easy bruising, no abnormal bleeding, no clotting disorder, no frequent epistaxis  Endocrine:  no diabetes, does not check CBG's at home     Physical Exam:   BP 105/62 mmHg  Pulse 78  Temp(Src) 98.2 F (36.8 C) (Oral)  Resp 18  Ht 5' (1.524 m)  Wt 47.5 kg (104 lb 11.5 oz)  BMI 20.45 kg/m2  SpO2 97%  General:  Thin, somewhat malnourished but o/w well-appearing  HEENT:  Unremarkable   Neck:   no JVD, no bruits, no adenopathy   Chest:   clear to auscultation with few scattered crackles and rhonchi, symmetrical breath sounds, no wheezes  CV:   RRR, grade III/VI systolic murmur   Abdomen:  soft, non-tender, no masses  Extremities:  warm, well-perfused,  pulses diminished but palpable, no lower extremity edema  Rectal/GU  Deferred  Neuro:   Grossly non-focal and symmetrical throughout  Skin:   Clean and dry, no rashes, no breakdown  Diagnostic Tests:  Transthoracic Echocardiography  Patient:  Elaine Le, Elaine Le MR #:    [id number] Study Date: 07/04/2014 Gender:   F Age:    52 Height:   162.6 cm Weight:   48.2 kg BSA:    1.47 m^2 Pt. Status: Room:    1237  ADMITTING  Norman Herrlich, Pete ATTENDING  Juanito Doom REFERRING  Salvadore Dom E SONOGRAPHER Donata Clay PERFORMING  [facility], Inpatient  cc:  ------------------------------------------------------------------- LV EF: 60% -  65%  ------------------------------------------------------------------- Indications:   Respiratory Failure acute 518.81.  ------------------------------------------------------------------- History:  Risk factors: Current tobacco use.  ------------------------------------------------------------------- Study Conclusions  - Left ventricle: The cavity size was normal. Systolic function was normal. The estimated ejection fraction was in the range of 60% to 65%. Wall motion was normal; there were no regional wall motion abnormalities. - Aortic valve: Mildly calcified annulus. Trileaflet. Minimal diffuse calcification. There was trivial regurgitation. - Mitral valve: The mitral valve leaflets are thickened and there is evidence of diastolic doming of the anterior mitral valve leaftet. The posterior leaflet is thickened and appears fixed. The anterior mitral valve leaflet has a large shaggy mobile density on the LV side of the leaflet that is only appreciated in the apical 4 and 2 chamber views. It is unclear as to whether the patient has underlying rheumatic mitral stenosis and now has possible endocarditis. This needs to be evaluated further with TEE.  Moderate diffuse thickening of the anterior leaflet. The findings are consistent with moderate stenosis. There was moderate regurgitation directed eccentrically and along the left atrial wall. Valve area by pressure half-time: 1.16 cm^2.  Transthoracic echocardiography. M-mode, complete 2D, spectral Doppler, and color Doppler. Birthdate: Patient birthdate: [birth year]. Age: Patient is 54 yr old. Sex: Gender: female. BMI: 18.2 kg/m^2. Blood pressure:  92/53 Patient status: Inpatient. Study date: Study date: 07/04/2014. Study time: 09:12 AM. Location: ICU/CCU  -------------------------------------------------------------------  ------------------------------------------------------------------- Left ventricle: The cavity size was normal. Systolic function was normal. The estimated ejection fraction was in the range of 60% to 65%. Wall motion was normal; there were no regional wall motion abnormalities.  ------------------------------------------------------------------- Aortic valve:  Mildly calcified annulus. Trileaflet. Minimal diffuse calcification. Mobility was not restricted. Doppler: Transvalvular velocity was within the normal range. There was no stenosis. There was trivial regurgitation.  ------------------------------------------------------------------- Aorta: Aortic root: The aortic root was normal in size.  ------------------------------------------------------------------- Mitral valve: The mitral valve leaflets are thickened and there is evidence of diastolic doming of the anterior mitral valve leaftet. The posterior leaflet is thickened and appears fixed. The anterior mitral valve leaflet has a large shaggy mobile density on the LV side of the leaflet that is only appreciated in the apical 4 and 2 chamber views. It is unclear as to whether the patient has underlying rheumatic mitral stenosis and now has possible endocarditis. This needs to  be evaluated further with TEE. Moderate diffuse thickening of the anterior leaflet. Doppler: The findings are consistent with moderate stenosis.  There was moderate regurgitation directed eccentrically and along the left atrial wall.  Valve area by pressure half-time: 1.16 cm^2. Indexed valve area by pressure half-time: 0.79 cm^2/m^2.  Peak gradient (D): 15 mm Hg.  ------------------------------------------------------------------- Left atrium: The atrium was normal in size.  ------------------------------------------------------------------- Right ventricle: The cavity size was normal. Wall thickness was normal. Systolic function was normal.  ------------------------------------------------------------------- Pulmonic valve:  Structurally normal valve.  Cusp separation was normal. Doppler: Transvalvular velocity was within the normal range. There was no evidence for stenosis. There was no regurgitation.  ------------------------------------------------------------------- Tricuspid valve:  Structurally normal valve.  Doppler: Transvalvular velocity was within the normal range. There was trivial regurgitation.  ------------------------------------------------------------------- Pulmonary artery:  The main pulmonary artery was normal-sized. Systolic pressure was within the normal range.  ------------------------------------------------------------------- Right atrium: The atrium was normal in size.  ------------------------------------------------------------------- Pericardium: There was no pericardial effusion.  ------------------------------------------------------------------- Systemic veins: Inferior vena cava: The vessel was mildly dilated.  ------------------------------------------------------------------- Measurements  Left ventricle             Value     Reference LV ID, ED, PLAX chordal    (L)   36  mm    43 -  52 LV ID, ES, PLAX chordal        27  mm    23 - 38 LV fx shortening, PLAX chordal (L)   25  %    >=29 LV PW thickness, ED          7.7  mm    --------- IVS/LV PW ratio, ED          1.05      <=1.3 LV e&', lateral             3.7  cm/s   --------- LV E/e&', lateral            52.97     --------- LV e&', medial             7.18 cm/s   --------- LV E/e&', medial            27.3      --------- LV e&', average             5.44 cm/s   --------- LV E/e&', average  36.03     ---------  Ventricular septum           Value     Reference IVS thickness, ED           8.1  mm    ---------  Aorta                 Value     Reference Aortic root ID             27  mm    ---------  Left atrium              Value     Reference LA ID, A-P, ES             38  mm    --------- LA ID/bsa, A-P         (H)   2.59 cm/m^2  <=2.2 LA volume/bsa, S            32  ml/m^2  --------- LA volume/bsa, ES, 1-p A4C       26.5 ml/m^2  --------- LA volume/bsa, ES, 1-p A2C       34  ml/m^2  --------- LA volume/bsa, ES, 2-p         32  ml/m^2  --------- LA volume/bsa, ES, A/L         32  ml/m^2  ---------  Mitral valve              Value     Reference Mitral E-wave peak velocity      196  cm/s   --------- Mitral A-wave peak velocity      191  cm/s   --------- Mitral deceleration slope       479  cm/s^2  --------- Mitral pressure half-time       190  ms    --------- Mitral peak gradient, D        15  mm Hg  --------- Mitral E/A ratio, peak         1.03      --------- Mitral valve area, PHT, DP        1.16 cm^2   --------- Mitral valve area/bsa, PHT, DP     0.79 cm^2/m^2 ---------  Systemic veins             Value     Reference Estimated CVP             8   mm Hg  ---------  Right ventricle            Value     Reference RV s&', lateral, S           9.68 cm/s   ---------  Legend: (L) and (H) mark values outside specified reference range.  ------------------------------------------------------------------- Prepared and Electronically Authenticated by  [name], MD 2016-03-08T12:40:59   Transesophageal Echocardiography  Patient:  Elaine Le, Elaine Le MR #:    [id number] Study Date: 07/10/2014 Gender:   F Age:    78 Height:   162.6 cm Weight:   43.2 kg BSA:    1.38 m^2 Pt. Status: Room:    Silvana SONOGRAPHER Florentina Jenny, RDCS ADMITTING  [place], [place]  Hoffman B ORDERING   [place], Christopher REFERRING  [place], Christopher  cc:  ------------------------------------------------------------------- LV EF: 55% -  60%  ------------------------------------------------------------------- Indications:   424.0 Mitral valve disease.  ------------------------------------------------------------------- Study Conclusions  - Left ventricle: Systolic function was normal. The  estimated ejection fraction was in the range of 55% to 60%. Wall motion was normal; there were no regional wall motion abnormalities. - Aortic valve: There was trivial regurgitation. - Mitral valve: The findings are consistent with moderate stenosis. There was severe regurgitation. Valve area by pressure half-time: 1.55 cm^2. - Left atrium: The atrium was moderately to severely dilated. - Right atrium: No evidence of thrombus in the atrial cavity or appendage. - Atrial septum: No defect or patent foramen  ovale was identified. - Tricuspid valve: No evidence of vegetation. - Pulmonic valve: No evidence of vegetation.  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  Diagnostic transesophageal echocardiography. 2D and color Doppler. Birthdate: Patient birthdate: [birth year]. Age: Patient is 54 yr old. Sex: Gender: female.  BMI: 16.3 kg/m^2. Blood pressure: 87/51 Patient status: Inpatient. Study date: Study date: 07/10/2014. Study time: 10:41 AM. Location: Endoscopy.  -------------------------------------------------------------------  ------------------------------------------------------------------- Left ventricle: Systolic function was normal. The estimated ejection fraction was in the range of 55% to 60%. Wall motion was normal; there were no regional wall motion abnormalities.  ------------------------------------------------------------------- Aortic valve:  Trileaflet. Doppler:  There was no stenosis. There was trivial regurgitation.  ------------------------------------------------------------------- Aorta: Descending aorta: Mild atherosclerosis.  ------------------------------------------------------------------- Mitral valve: Thickened, rheumatic mitral valve. Doming of anterior leaflet. Restricted posterior leaflet. Doppler: The findings are consistent with moderate stenosis. Gradient overestimates severity due to mitral regurgitation. There was severe regurgitation.  Valve area by pressure half-time: 1.55 cm^2. Indexed valve area by pressure half-time: 1.12 cm^2/m^2. Mean gradient (D): 17 mm Hg.  ------------------------------------------------------------------- Left atrium: The atrium was moderately to severely dilated.  ------------------------------------------------------------------- Atrial septum: No defect or patent foramen ovale was  identified.  ------------------------------------------------------------------- Right ventricle: The cavity size was normal. Systolic function was normal.  ------------------------------------------------------------------- Pulmonic valve:  Structurally normal valve.  Cusp separation was normal. No evidence of vegetation. Doppler: There was trivial regurgitation.  ------------------------------------------------------------------- Tricuspid valve:  Structurally normal valve.  Leaflet separation was normal. No evidence of vegetation. Doppler: There was mild regurgitation.  ------------------------------------------------------------------- Right atrium: The atrium was normal in size. No evidence of thrombus in the atrial cavity or appendage.  ------------------------------------------------------------------- Pericardium: There was no pericardial effusion.  ------------------------------------------------------------------- Measurements  Mitral valve            Value Mitral mean velocity, D       197  cm/s Mitral pressure half-time      115  ms Mitral mean gradient, D       17  mm Hg Mitral valve area, PHT, DP     1.55 cm^2 Mitral valve area/bsa, PHT, DP   1.12 cm^2/m^2 Mitral annulus VTI, D        84.5 cm  Legend: (L) and (H) mark values outside specified reference range.  ------------------------------------------------------------------- Prepared and Electronically Authenticated by  Kirk Ruths 2016-03-14T18:05:47      Cardiac Catheterization Operative Report  Elaine Le [id number] 3/31/20169:14 AM Elaine Amel, MD  Procedure Performed:  1. Left Heart Catheterization 2. Selective Coronary Angiography 3. Right Heart Catheterization 4. Left ventricular angiogram  Operator: Lauree Chandler, MD  Indication: 54 yo female with history of rheumatic heart disease  with moderate MS and severe MR.   Procedure Details: The risks, benefits, complications, treatment options, and expected outcomes were discussed with the patient. The patient and/or family concurred with the proposed plan, giving informed consent. The patient was brought to the cath lab after IV hydration was begun and oral premedication  was given. The patient was further sedated with Versed and Fentanyl. The right groin was prepped and draped in the usual manner. Using the modified Seldinger access technique, a 5 French sheath was placed in the right femoral artery. A 7 French sheath was inserted into the right femoral vein. A balloon tipped catheter was used to perform a right heart catheterization. Standard diagnostic catheters were used to perform selective coronary angiography. A pigtail catheter was used to perform a left ventricular angiogram. There were no immediate complications. The patient was taken to the recovery area in stable condition.   Hemodynamic Findings: Ao: 85/47  LV: 79/4/11 RA: 12  RV: 50/7/17 PA: 48/29 (mean 38) PCWP: 33  Fick Cardiac Output: 5.2 L/min Fick Cardiac Index: 3.67 L/min/m2 Central Aortic Saturation: 100% Pulmonary Artery Saturation: 77%  Angiographic Findings:  Left main: Short segment without obstructive disease.   Left Anterior Descending Artery: Large caliber vessel that courses to the apex. The proximal and mid vessel has diffuse 20% stenosis.   Circumflex Artery: Moderate caliber vessel with moderate caliber intermediate branch. The intermediate branch is free of obstructive disease. The AV groove Circumflex has a distal 20% stenosis.   Right Coronary Artery: Moderate caliber co-dominant vessel with no focally obstructive disease. There is intense spasm noted in the proximal vessel with catheter engagement that resolves with IC NTG.   Left Ventricular Angiogram: LVEF=65-70%. 3+ mitral  regurgitation noted.   Impression: 1. Mild non-obstructive CAD 2. Normal LV systolic function 3. Severe mitral regurgitation leading to elevated filling pressures  Recommendations: Will ask CT surgery to evaluate for possible MV surgery. She will be kept as in an inpatient at [facility].    Complications: None; patient tolerated the procedure well.       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  I have personally reviewed the patient's recent transthoracic and transesophageal echocardiograms and diagnostic cardiac catheterization. The patient has classical rheumatic features with severe thickening and restricted leaflet mobility involving both the anterior and posterior leaflets of the mitral valve with foreshortening of the subvalvular apparatus.  Left ventricular size and systolic function remain normal. The patient does not have significant coronary artery disease. The patient's valvular heart disease is undoubtedly chronic, and the development of symptoms of congestive heart failure have been exacerbated by the patient's recent severe illness with influenza A and community acquired pneumonia.  In addition, the patient has long-standing tobacco abuse with likely significant COPD. There is no question that she will ultimately require mitral valve replacement, but I do not feel that it would be wise to proceed with surgery this soon after her recent prolonged hospitalization.  Now that she has been treated for congestive heart failure she seems to be doing remarkably well.    Plan:  The patient and her mother were counseled at length regarding the indications, risks and potential benefits of mitral valve replacement.  The rationale for elective surgery has been explained, including a comparison between surgery and continued medical therapy with close follow-up.  The  likelihood of successful and durable valve repair has been discussed with particular reference to the findings of their recent echocardiogram.  Based upon these findings and previous experience, I have quoted them a less than 25 percent likelihood of successful valve repair.  Given the likelihood that her valve cannot be successfully repaired, we discussed the possibility of replacing the mitral valve using a mechanical  prosthesis with the attendant need for long-term anticoagulation versus the alternative of replacing it using a bioprosthetic tissue valve with its potential for late structural valve deterioration and failure, depending upon the patient's longevity.  Alternative surgical approaches have been discussed including a comparison between conventional sternotomy and minimally-invasive techniques.  The timing of surgery has been discussed in detail, including the reasons why I think it would be wise to wait at least a few weeks.  The need for long-term medical therapy for congestive heart failure has been discussed.  The importance that the patient continue to avoid smoking at all cost has been emphasized.  All of their questions have been answered.  The patient needs to undergo formal pulmonary function tests including pre-and post bronchodilator therapy and diffusion capacity.  We will also obtain a formal CT angiogram of the abdomen and pelvis to evaluate peripheral arterial access for surgery.  All of these tests could be performed as an outpatient.  I spent in excess of 120 minutes during the conduct of this hospital consultation and >50% of this time involved direct face-to-face encounter for counseling and/or coordination of the patient's care.   Valentina Gu. Roxy Manns, MD 07/28/2014 5:56 AM

## 2014-07-28 NOTE — Progress Notes (Signed)
PFTs ordered, Lasix changed to PO, awaiting final recommendations from Dr Roxy Manns.  Kerin Ransom PA-C 07/28/2014 2:42 PM

## 2014-07-28 NOTE — Progress Notes (Signed)
CARDIAC REHAB PHASE I   PRE:  Rate/Rhythm: 92 SR  BP:  Supine:   Sitting: 111/61  Standing:    SaO2: 97 RA  MODE:  Ambulation: 1000 ft   POST:  Rate/Rhythm: 102 ST  BP:  Supine:   Sitting: 121/63  Standing:    SaO2: 98 RA 0820-0910 Pt tolerated ambulation well. Gait steady. She was able to walk 1000 feet without c/o. VS stable. Pt back to side of bed after walk with call light  In reach. Completed pre-op education with pt and her mother.I gave pt surgery education booklet and pt care guide. I encouraged pt to watch surgery video. Instructed pt on use of IS and encouraged her to practice with it. Explained to pt that she would need need someone 24/7 at discharge, she does live alone.  Rodney Langton RN 07/28/2014 9:16 AM

## 2014-07-28 NOTE — Progress Notes (Signed)
Pharmacy: coumadin education  Spent > 45 minutes with pt and dtr on coumadin education. All questions answered.  She is concerned she will have an allergic reaction to coumadin and is not keen on the idea of being tied down long term to coumadin.  Eudelia Bunch, Pharm.D. [phone] 07/28/2014 8:01 PM

## 2014-07-28 NOTE — Progress Notes (Signed)
VASCULAR LAB PRELIMINARY  PRELIMINARY  PRELIMINARY  PRELIMINARY  Pre-op Cardiac Surgery  Carotid Findings:  Bilateral:  1-39% ICA stenosis.  Vertebral artery flow is antegrade.      Upper Extremity Right Left  Brachial Pressures    Radial Waveforms    Ulnar Waveforms    Palmar Arch (Allen's Test)     Findings:      Lower  Extremity Right Left  Dorsalis Pedis    Anterior Tibial    Posterior Tibial    Ankle/Brachial Indices      Findings:     Elaine Le, RVT 07/28/2014, 1:18 PM

## 2014-07-28 NOTE — Progress Notes (Signed)
    Subjective:  SOB improved, off O2  Objective:  Vital Signs in the last 24 hours: Temp:  [97.5 F (36.4 C)-98.6 F (37 C)] 98.2 F (36.8 C) (04/01 0602) Pulse Rate:  [0-86] 79 (04/01 0602) Resp:  [14-24] 15 (04/01 0602) BP: (88-116)/(46-65) 105/58 mmHg (04/01 0602) SpO2:  [95 %-100 %] 99 % (04/01 0602) Weight:  [104 lb 11.5 oz (47.5 kg)] 104 lb 11.5 oz (47.5 kg) (04/01 0500)  Intake/Output from previous day:  Intake/Output Summary (Last 24 hours) at 07/28/14 0750 Last data filed at 07/28/14 0604  Gross per 24 hour  Intake  627.5 ml  Output   1400 ml  Net -772.5 ml    Physical Exam: General appearance: alert, cooperative, no distress and thin Neck: no JVD Lungs: clear to auscultation bilaterally Heart: regular rate and rhythm Extremities: no edema   Rate: 78  Rhythm: normal sinus rhythm  Lab Results:  Recent Labs  07/26/14 0335 07/27/14 0335  WBC 14.1* 9.4  HGB 11.5* 11.6*  PLT 275 282    Recent Labs  07/26/14 0335 07/27/14 0335  NA 139 139  K 4.1 4.0  CL 103 102  CO2 29 29  GLUCOSE 113* 111*  BUN 15 20  CREATININE 0.63 0.62    Recent Labs  07/27/14 0335 07/28/14 0337  TROPONINI <0.03 0.04*    Recent Labs  07/25/14 0813  INR 1.00    Imaging: Imaging results have been reviewed  Cardiac Studies:  Assessment/Plan:  54 yo WF recently [place] on 07/20/14 following admission on 06/23/14 with influenza A and acute respiratory failure requiring ventilator support. During that admission she had and Echo that showed a ? Of vegetation on the MV. 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 She was re admitted 07/25/14 with recurrent respiratory failure suspected to be CHF. Rt and Lt heart cath done 07/27/14 showed severe MR with NL LVF and mild non obstructive CAD. She has been seen by Dr Roxy Manns and may be a candidate for minimally invasive MV surgery, the timing of this is currently in question.   Principal  Problem:   Acute on chronic respiratory failure Active Problems:   Severe mitral regurgitation   Influenza A- recent hospitalization   COPD (chronic obstructive pulmonary disease)-PFT pending    Chronic pain syndrome   Tobacco abuse   CAD- mild, non obstructive at cath 07/27/14   PLAN: She has diuresed 2L and B/P is soft,  will hold Lasix this am till BMP is back. Will discuss timing of PFTs with MD (not ordered yet).   Kerin Ransom PA-C [facility] [phone] 07/28/2014, 7:50 AM

## 2014-07-29 ENCOUNTER — Inpatient Hospital Stay (HOSPITAL_COMMUNITY): Payer: Medicare Other

## 2014-07-29 ENCOUNTER — Encounter (HOSPITAL_COMMUNITY): Payer: Self-pay | Admitting: Radiology

## 2014-07-29 DIAGNOSIS — I5032 Chronic diastolic (congestive) heart failure: Secondary | ICD-10-CM | POA: Insufficient documentation

## 2014-07-29 DIAGNOSIS — I5033 Acute on chronic diastolic (congestive) heart failure: Secondary | ICD-10-CM | POA: Insufficient documentation

## 2014-07-29 LAB — BASIC METABOLIC PANEL
Anion gap: 4 — ABNORMAL LOW (ref 5–15)
BUN: 17 mg/dL (ref 6–23)
CO2: 34 mmol/L — ABNORMAL HIGH (ref 19–32)
Calcium: 9.5 mg/dL (ref 8.4–10.5)
Chloride: 98 mmol/L (ref 96–112)
Creatinine, Ser: 0.68 mg/dL (ref 0.50–1.10)
GFR calc Af Amer: >90 mL/min (ref >90)
GFR calc non Af Amer: >90 mL/min (ref >90)
Glucose, Bld: 113 mg/dL — ABNORMAL HIGH (ref 70–99)
Potassium: 4.6 mmol/L (ref 3.5–5.1)
Sodium: 136 mmol/L (ref 135–145)

## 2014-07-29 MED ORDER — ACETAMINOPHEN 325 MG PO TABS: 650.0000 mg | ORAL_TABLET | ORAL | Status: DC | PRN

## 2014-07-29 MED ORDER — POTASSIUM CHLORIDE CRYS ER 20 MEQ PO TBCR: 20.0000 meq | EXTENDED_RELEASE_TABLET | Freq: Every day | ORAL | Status: DC

## 2014-07-29 MED ORDER — FUROSEMIDE 40 MG PO TABS: 40.0000 mg | ORAL_TABLET | Freq: Every day | ORAL | Status: DC

## 2014-07-29 MED ORDER — IOHEXOL 350 MG/ML SOLN
80.0000 mL | Freq: Once | INTRAVENOUS | Status: AC | PRN
  Administered 2014-07-29: 06:00:00 80 mL via INTRAVENOUS

## 2014-07-29 NOTE — Progress Notes (Signed)
    SUBJECTIVE:  She feels well and is ready to go home.  No pain.  No SOB   PHYSICAL EXAM Filed Vitals:   07/28/14 1534 07/28/14 2035 07/28/14 2345 07/29/14 0400  BP: 105/64 110/73 114/56 115/72  Pulse: 86 79  75  Temp: 98.3 F (36.8 C) 98.5 F (36.9 C) 98.4 F (36.9 C) 98 F (36.7 C)  TempSrc: Oral Oral Oral Oral  Resp: 20 22 18    Height:      Weight:    109 lb 12.6 oz (49.8 kg)  SpO2: 96% 98% 98% 98%   General:  No distress Lungs:  Clear Heart:  RRR Abdomen:  Positive bowel sounds, no rebound no guarding Extremities:  No edema  LABS:   Results for orders placed or performed during the hospital encounter of 07/25/14 (from the past 24 hour(s))  Basic metabolic panel     Status: Abnormal   Collection Time: 07/28/14 11:24 AM  Result Value Ref Range   Sodium 136 135 - 145 mmol/L   Potassium 4.2 3.5 - 5.1 mmol/L   Chloride 97 96 - 112 mmol/L   CO2 30 19 - 32 mmol/L   Glucose, Bld 118 (H) 70 - 99 mg/dL   BUN 12 6 - 23 mg/dL   Creatinine, Ser 0.55 0.50 - 1.10 mg/dL   Calcium 10.0 8.4 - 10.5 mg/dL   GFR calc non Af Amer >90 >90 mL/min   GFR calc Af Amer >90 >90 mL/min   Anion gap 9 5 - 15  Basic metabolic panel     Status: Abnormal   Collection Time: 07/29/14  3:15 AM  Result Value Ref Range   Sodium 136 135 - 145 mmol/L   Potassium 4.6 3.5 - 5.1 mmol/L   Chloride 98 96 - 112 mmol/L   CO2 34 (H) 19 - 32 mmol/L   Glucose, Bld 113 (H) 70 - 99 mg/dL   BUN 17 6 - 23 mg/dL   Creatinine, Ser 0.68 0.50 - 1.10 mg/dL   Calcium 9.5 8.4 - 10.5 mg/dL   GFR calc non Af Amer >90 >90 mL/min   GFR calc Af Amer >90 >90 mL/min   Anion gap 4 (L) 5 - 15    Intake/Output Summary (Last 24 hours) at 07/29/14 2197 Last data filed at 07/28/14 2042  Gross per 24 hour  Intake    689 ml  Output   2600 ml  Net  -1911 ml      ASSESSMENT AND PLAN:  Severe MR:  OK to go home after we see the results of the CT scan.   Follow up with Dr. Roxy Manns to discuss the timing of MVR.  Home on  Lasix 40 mg daily.    COPD:  CT results pending.  Continue current pulmonary meds.   CAD:  Mild nonobstructive.    Jeneen Rinks [facility] 07/29/2014 7:28 AM

## 2014-07-29 NOTE — Progress Notes (Signed)
Pt has not used PAP machine since 06-28-14. No machine in the room. According to the nurse there are  plans to discharge pt today.

## 2014-07-29 NOTE — Discharge Instructions (Signed)
Mitral Valvular Regurgitation Mitral valvular regurgitation (MVR, MR) is a condition in which there is a leaky mitral valve. The mitral valve is the large valve between the two left chambers of the heart. When the large muscular ventricle contracts to pump blood, the mitral valve keeps that blood from flowing backward and back into the atrium. If there is too much regurgitation, the heart has to work harder. This eventually can cause heart failure. When your heart goes into failure, you do not feel well. You have shortness of breath (dyspnea) with exertion. The kidneys do not work as well so you may retain fluid. This is one of the reasons your lower legs and ankles may swell. You may have a rapid weight gain. In addition to this swelling, the fluid retention makes fluid back up in the lungs. This causes additional shortness of breath, which then makes the failure worse. The first sign you will usually recognize is shortness of breath with exertion (climbing stairs for example). You will also usually get a rapid heartbeat. Upon discharge from this location, weigh yourself after arriving home. Record your weight at the same time every day as this will provide a record of your progress. As you get better, your weight will usually go down. Follow a low sodium (low salt) diet. You may notice that you get short of breath while sleeping. The heart actually has to work harder while you are lying down. This may also produce a night cough or make it necessary to sleep with two or more pillows. SYMPTOMS  You may have no symptoms if mitral regurgitation is mild. It may be discovered only during a routine exam by your caregiver when a heart murmur is heard. DIAGNOSIS  The best study for mitral regurgitation is the echocardiogram (ultrasound of the heart). This shows the cause of the MR and how bad it is. It also gives information about the left ventricle and atrium (the two heart chambers that are bridged by the mitral  valve.) TREATMENT   Early MR may be treated with medications. If there are no medication allergies or problems, ACE Inhibitors are commonly used in the treatment of MR. Under treatment, these symptoms usually improve rapidly. Medications treat but will not cure or slow the progression of the MR. This is more dependent on the cause of the MR.  If MR becomes more severe, surgery may become necessary to repair or replace the valve. This is called open heart surgery.  There is no absolute age limitation to valve surgery. 54 year old people have had their valves replaced. The risk of stroke and death is low with open heart surgery, but does increase a with age and other medical problems. Elderly patients that are otherwise healthy usually do well with valve replacement surgery.  A cardiac catheterization is usually done prior to valve surgery unless the patient is very [place]. This is done to check the health of coronary arteries. If there are blockages, a bypass can be done while the chest is open for the valve replacement. If you are beginning to have symptoms from mitral regurgitation or are waiting for a surgical procedure to help you with this problem, following are some of the things you can do to help yourself while you are waiting for surgery or are simply putting off the surgery to see if it is needed. HOME CARE INSTRUCTIONS   Activity Level--- Your caregiver will help you determine what type of exercise program may be helpful. It is important  to maintain strength and increase it if possible. Pace your activities and avoid shortness of breath or chest pain. Plan activities for at least an hour after meals or before eating. This allows your body to handle one activity at a time. Your caregiver can help advise you for activities.  Diet--- Maintain a low salt diet or as directed by your caregiver and eat a heart healthy diet. Get diet information from your caregiver or dietician. Remove your salt  shaker and avoid adding salt to you foods. Measure the amounts of fluids you take in per day in cups and record these amounts.  Discharge Medications--- You may have been prescribed an ACE inhibitor or a beta blocker to take for your heart failure. Take either as directed as this improves your heart function and your survival. Ask your caregiver if being on statins (cholesterol lowering drugs) would be helpful.  Weight Monitoring---Weigh yourself today. When you get home, compare it to your scale and record your weight. Weigh twice per day and record these weights and try to weigh at the same time every day. It is best to weigh first thing in the morning, in your same clothes, after going to the bathroom and before eating or drinking anything. Place the scale on a hard surfaced floor. Bring these weights to your caregiver to be reviewed during your appointments.  Blood pressure monitoring should be done twice per week. You can get a home blood pressure cuff at your drugstore. Record these values and bring them with you for your clinic visits. Notify your caregiver if you become dizzy or lightheaded upon standing up.  Be familiar with your medications--- If you have trouble remembering when you took them, write down times or set your medications out in advance for the day or the week to avoid problems. If you are on medications and do not remember if you have taken your medication, just skip it for that day unless your caregiver advises you otherwise. If you are on a diuretic (water pill), take these in the morning so you are not up all night going to the bathroom.  If you are currently a smoker, it is time to quit. Nicotine makes your heart work harder and is one of the leading causes of cardiac (heart) deaths. Do not leave without a smoking cessation plan or instructions on help available to quit smoking.  Immunization with influenza and pneumococcal vaccines may reduce the risk of respiratory  infection.  Nonsteroidal anti-inflammatory drugs should not be used. They can cause sodium (salt) retention and also may hurt the action of diuretics and ACE inhibitors.  Aldosterone Antagonists may have beneficial effects.  If you do not follow your diet and take your medications properly, this may rapidly lead to emergency care or hospitalization. Follow the advice of your caregiver.  What To Do If Symptoms Worsen--- If there are immediate problems go to the Emergency Department. This would include any symptoms which brought you in and which are getting worse rather than better. Call emergency services (911 in [place] for immediate care. DAILY PATH TO QUALITY LIVING  Monitor weight and record.  Monitor blood pressure and record.  Monitor fluid intake.  Monitor Sodium intake.  Monitor Activity Levels.  Take your medications.  Stop all use of nicotine.  Avoid alcohol.  Know when to call for help and do so. SEEK IMMEDIATE MEDICAL CARE IF:  Your weight increases by 03 lb/1.4 kg in 1 day or 05 lb/2.3 kg in a week,  or as your caregiver suggests.  You notice increasing shortness of breath during rest, sleeping, or with activity, and which is unusual for you.  You develop chest pain.  You develop sweating or nausea which is unusual for you.  You notice increased swelling in your hands, feet, ankles or abdomen.  You have a feeling of fullness in your abdomen or develop nausea or loss of appetite.  You notice dizziness, blurred vision, headache, or unsteadiness. Make an appointment with your caregiver as directed for follow-up. MAKE SURE YOU:   Understand these instructions.  Will watch your condition.  Will get help right away if you are not doing well or get worse. Document Released: 07/02/2004 Document Revised: 07/07/2011 Document Reviewed: 06/11/2007 [facility] Patient Information 2015 [place], [place]. This information is not intended to replace advice given to you by your  health care provider. Make sure you discuss any questions you have with your health care provider. Heart Failure Heart failure is a condition in which the heart has trouble pumping blood. This means your heart does not pump blood efficiently for your body to work well. In some cases of heart failure, fluid may back up into your lungs or you may have swelling (edema) in your lower legs. Heart failure is usually a long-term (chronic) condition. It is important for you to take good care of yourself and follow your health care provider's treatment plan. CAUSES  Some health conditions can cause heart failure. Those health conditions include:  High blood pressure (hypertension). Hypertension causes the heart muscle to work harder than normal. When pressure in the blood vessels is high, the heart needs to pump (contract) with more force in order to circulate blood throughout the body. High blood pressure eventually causes the heart to become stiff and weak.  Coronary artery disease (CAD). CAD is the buildup of cholesterol and fat (plaque) in the arteries of the heart. The blockage in the arteries deprives the heart muscle of oxygen and blood. This can cause chest pain and may lead to a heart attack. High blood pressure can also contribute to CAD.  Heart attack (myocardial infarction). A heart attack occurs when one or more arteries in the heart become blocked. The loss of oxygen damages the muscle tissue of the heart. When this happens, part of the heart muscle dies. The injured tissue does not contract as well and weakens the heart's ability to pump blood.  Abnormal heart valves. When the heart valves do not open and close properly, it can cause heart failure. This makes the heart muscle pump harder to keep the blood flowing.  Heart muscle disease (cardiomyopathy or myocarditis). Heart muscle disease is damage to the heart muscle from a variety of causes. These can include drug or alcohol abuse, infections,  or unknown reasons. These can increase the risk of heart failure.  Lung disease. Lung disease makes the heart work harder because the lungs do not work properly. This can cause a strain on the heart, leading it to fail.  Diabetes. Diabetes increases the risk of heart failure. High blood sugar contributes to high fat (lipid) levels in the blood. Diabetes can also cause slow damage to tiny blood vessels that carry important nutrients to the heart muscle. When the heart does not get enough oxygen and food, it can cause the heart to become weak and stiff. This leads to a heart that does not contract efficiently.  Other conditions can contribute to heart failure. These include abnormal heart rhythms, thyroid problems, and  low blood counts (anemia). Certain unhealthy behaviors can increase the risk of heart failure, including:  Being overweight.  Smoking or chewing tobacco.  Eating foods high in fat and cholesterol.  Abusing illicit drugs or alcohol.  Lacking physical activity. SYMPTOMS  Heart failure symptoms may vary and can be hard to detect. Symptoms may include:  Shortness of breath with activity, such as climbing stairs.  Persistent cough.  Swelling of the feet, ankles, legs, or abdomen.  Unexplained weight gain.  Difficulty breathing when lying flat (orthopnea).  Waking from sleep because of the need to sit up and get more air.  Rapid heartbeat.  Fatigue and loss of energy.  Feeling light-headed, dizzy, or close to fainting.  Loss of appetite.  Nausea.  Increased urination during the night (nocturia). DIAGNOSIS  A diagnosis of heart failure is based on your history, symptoms, physical examination, and diagnostic tests. Diagnostic tests for heart failure may include:  Echocardiography.  Electrocardiography.  Chest X-ray.  Blood tests.  Exercise stress test.  Cardiac angiography.  Radionuclide scans. TREATMENT  Treatment is aimed at managing the symptoms of  heart failure. Medicines, behavioral changes, or surgical intervention may be necessary to treat heart failure.  Medicines to help treat heart failure may include:  Angiotensin-converting enzyme (ACE) inhibitors. This type of medicine blocks the effects of a blood protein called angiotensin-converting enzyme. ACE inhibitors relax (dilate) the blood vessels and help lower blood pressure.  Angiotensin receptor blockers (ARBs). This type of medicine blocks the actions of a blood protein called angiotensin. Angiotensin receptor blockers dilate the blood vessels and help lower blood pressure.  Water pills (diuretics). Diuretics cause the kidneys to remove salt and water from the blood. The extra fluid is removed through urination. This loss of extra fluid lowers the volume of blood the heart pumps.  Beta blockers. These prevent the heart from beating too fast and improve heart muscle strength.  Digitalis. This increases the force of the heartbeat.  Healthy behavior changes include:  Obtaining and maintaining a healthy weight.  Stopping smoking or chewing tobacco.  Eating heart-healthy foods.  Limiting or avoiding alcohol.  Stopping illicit drug use.  Physical activity as directed by your health care provider.  Surgical treatment for heart failure may include:  A procedure to open blocked arteries, repair damaged heart valves, or remove damaged heart muscle tissue.  A pacemaker to improve heart muscle function and control certain abnormal heart rhythms.  An internal cardioverter defibrillator to treat certain serious abnormal heart rhythms.  A left ventricular assist device (LVAD) to assist the pumping ability of the heart. HOME CARE INSTRUCTIONS   Take medicines only as directed by your health care provider. Medicines are important in reducing the workload of your heart, slowing the progression of heart failure, and improving your symptoms.  Do not stop taking your medicine unless  directed by your health care provider.  Do not skip any dose of medicine.  Refill your prescriptions before you run out of medicine. Your medicines are needed every day.  Engage in moderate physical activity if directed by your health care provider. Moderate physical activity can benefit some people. The elderly and people with severe heart failure should consult with a health care provider for physical activity recommendations.  Eat heart-healthy foods. Food choices should be free of trans fat and low in saturated fat, cholesterol, and salt (sodium). Healthy choices include fresh or frozen fruits and vegetables, fish, lean meats, legumes, fat-free or low-fat dairy products, and whole grain  or high fiber foods. Talk to a dietitian to learn more about heart-healthy foods.  Limit sodium if directed by your health care provider. Sodium restriction may reduce symptoms of heart failure in some people. Talk to a dietitian to learn more about heart-healthy seasonings.  Use healthy cooking methods. Healthy cooking methods include roasting, grilling, broiling, baking, poaching, steaming, or stir-frying. Talk to a dietitian to learn more about healthy cooking methods.  Limit fluids if directed by your health care provider. Fluid restriction may reduce symptoms of heart failure in some people.  Weigh yourself every day. Daily weights are important in the early recognition of excess fluid. You should weigh yourself every morning after you urinate and before you eat breakfast. Wear the same amount of clothing each time you weigh yourself. Record your daily weight. Provide your health care provider with your weight record.  Monitor and record your blood pressure if directed by your health care provider.  Check your pulse if directed by your health care provider.  Lose weight if directed by your health care provider. Weight loss may reduce symptoms of heart failure in some people.  Stop smoking or chewing  tobacco. Nicotine makes your heart work harder by causing your blood vessels to constrict. Do not use nicotine gum or patches before talking to your health care provider.  Keep all follow-up visits as directed by your health care provider. This is important.  Limit alcohol intake to no more than 1 drink per day for nonpregnant women and 2 drinks per day for men. One drink equals 12 ounces of beer, 5 ounces of wine, or 1 ounces of hard liquor. Drinking more than that is harmful to your heart. Tell your health care provider if you drink alcohol several times a week. Talk with your health care provider about whether alcohol is safe for you. If your heart has already been damaged by alcohol or you have severe heart failure, drinking alcohol should be stopped completely.  Stop illicit drug use.  Stay up-to-date with immunizations. It is especially important to prevent respiratory infections through current pneumococcal and influenza immunizations.  Manage other health conditions such as hypertension, diabetes, thyroid disease, or abnormal heart rhythms as directed by your health care provider.  Learn to manage stress.  Plan rest periods when fatigued.  Learn strategies to manage high temperatures. If the weather is extremely hot:  Avoid vigorous physical activity.  Use air conditioning or fans or seek a cooler location.  Avoid caffeine and alcohol.  Wear loose-fitting, lightweight, and light-colored clothing.  Learn strategies to manage cold temperatures. If the weather is extremely cold:  Avoid vigorous physical activity.  Layer clothes.  Wear mittens or gloves, a hat, and a scarf when going outside.  Avoid alcohol.  Obtain ongoing education and support as needed.  Participate in or seek rehabilitation as needed to maintain or improve independence and quality of life. SEEK MEDICAL CARE IF:   Your weight increases by 03 lb/1.4 kg in 1 day or 05 lb/2.3 kg in a week.  You have  increasing shortness of breath that is unusual for you.  You are unable to participate in your usual physical activities.  You tire easily.  You cough more than normal, especially with physical activity.  You have any or more swelling in areas such as your hands, feet, ankles, or abdomen.  You are unable to sleep because it is hard to breathe.  You feel like your heart is beating fast (palpitations).  You  become dizzy or light-headed upon standing up. SEEK IMMEDIATE MEDICAL CARE IF:   You have difficulty breathing.  There is a change in mental status such as decreased alertness or difficulty with concentration.  You have a pain or discomfort in your chest.  You have an episode of fainting (syncope). MAKE SURE YOU:   Understand these instructions.  Will watch your condition.  Will get help right away if you are not doing well or get worse. Document Released: 04/14/2005 Document Revised: 08/29/2013 Document Reviewed: 05/14/2012 [facility] Patient Information 2015 [place], [place]. This information is not intended to replace advice given to you by your health care provider. Make sure you discuss any questions you have with your health care provider.

## 2014-07-29 NOTE — Discharge Summary (Signed)
Patient ID: Elaine Le,  MRN: [id number], DOB/AGE: 05/13/1960 54 y.o.  Admit date: 07/25/2014 Discharge date: 07/29/2014  Primary Care Provider: [name], MD Primary Cardiologist: Dr Mare Ferrari  Discharge Diagnoses Principal Problem:   Acute on chronic respiratory failure Active Problems:   Severe mitral regurgitation   Acute congestive heart failure secondary to severe MR   Influenza A- recent hospitalization   COPD (chronic obstructive pulmonary disease)-PFT pending    Chronic pain syndrome   Tobacco abuse   CAD- mild, non obstructive at cath 07/27/14    Procedures: Rt and Lt heart cath 07/27/14   Hospital Course:  54 yo WF seen at the request of pulmonary/CCM. She was recently DC on 07/20/14 following admission on 06/23/14 with influenza A and acute respiratory failure requiring ventilator support. During that admission she had and Echo that showed a ? of a vegetation on the MV. 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 She was doing well post DC and follow up CXR on 3/21 showed clear lung fields. She then developed progressive orthopnea with PND and was re admitted with CHF 07/25/14. She was treated with IV diuretics. She improved and underwent Rt and Lt heart cath 07/27/14. This showed mild non obstructive CAD, NL LVF, and severe MR, She was seen in consult by Dr Roxy Manns who felt she could be a candidate for minimally invasive MV surgery. Pre op carotid dopplers showed 1-39% Bilat ICA stenosis. PFTs were done, results are pending, CT of abdomin was done and showed Diffuse mild to moderate aortoiliac atherosclerosis without occlusive process, aneurysm or dissection. No significant vascular disease to impede endovascular assisted mitral valve replacement.. We feel she can be discharged 07/29/14 and follow up with Dr Roxy Manns. She is going home on Lasix 40 mg and K+ 20 meq.   Discharge Vitals:  Blood pressure 106/48, pulse 89,  temperature 98.6 F (37 C), temperature source Oral, resp. rate 21, height 5' (1.524 m), weight 109 lb 12.6 oz (49.8 kg), SpO2 95 %.    Labs: Results for orders placed or performed during the hospital encounter of 07/25/14 (from the past 24 hour(s))  Basic metabolic panel     Status: Abnormal   Collection Time: 07/28/14 11:24 AM  Result Value Ref Range   Sodium 136 135 - 145 mmol/L   Potassium 4.2 3.5 - 5.1 mmol/L   Chloride 97 96 - 112 mmol/L   CO2 30 19 - 32 mmol/L   Glucose, Bld 118 (H) 70 - 99 mg/dL   BUN 12 6 - 23 mg/dL   Creatinine, Ser 0.55 0.50 - 1.10 mg/dL   Calcium 10.0 8.4 - 10.5 mg/dL   GFR calc non Af Amer >90 >90 mL/min   GFR calc Af Amer >90 >90 mL/min   Anion gap 9 5 - 15  Basic metabolic panel     Status: Abnormal   Collection Time: 07/29/14  3:15 AM  Result Value Ref Range   Sodium 136 135 - 145 mmol/L   Potassium 4.6 3.5 - 5.1 mmol/L   Chloride 98 96 - 112 mmol/L   CO2 34 (H) 19 - 32 mmol/L   Glucose, Bld 113 (H) 70 - 99 mg/dL   BUN 17 6 - 23 mg/dL   Creatinine, Ser 0.68 0.50 - 1.10 mg/dL   Calcium 9.5 8.4 - 10.5 mg/dL   GFR calc non Af Amer >90 >90 mL/min   GFR calc Af Amer >90 >90  mL/min   Anion gap 4 (L) 5 - 15    Disposition:      Follow-up Information    Follow up with Rexene Alberts, MD.   Specialty:  Cardiothoracic Surgery   Why:  office will contact you   Contact information:   [address] [address] [phone]       Discharge Medications:    Medication List    TAKE these medications        acetaminophen 325 MG tablet  Commonly known as:  TYLENOL  Take 2 tablets (650 mg total) by mouth every 4 (four) hours as needed for mild pain (temp > 101.5).     aspirin 81 MG tablet  Take 81 mg by mouth at bedtime.     furosemide 40 MG tablet  Commonly known as:  LASIX  Take 1 tablet (40 mg total) by mouth daily.     ibuprofen 200 MG tablet  Commonly known as:  ADVIL,MOTRIN  Take 400 mg by mouth every 6  (six) hours as needed (pain).     lactulose 10 GM/15ML solution  Commonly known as:  CHRONULAC  Take 10-20 g by mouth daily as needed for mild constipation. 1-2 tablespoons daily as needed     polyethylene glycol powder powder  Commonly known as:  GLYCOLAX  Take 255 g by mouth daily as needed.     potassium chloride SA 20 MEQ tablet  Commonly known as:  K-DUR,KLOR-CON  Take 1 tablet (20 mEq total) by mouth daily.     PREMARIN vaginal cream  Generic drug:  conjugated estrogens  Place 1 Applicatorful vaginally daily as needed.     ranitidine 150 MG tablet  Commonly known as:  ZANTAC  TAKE 1 TABLET BY MOUTH EVERY DAY     sodium chloride 0.65 % nasal spray  Commonly known as:  OCEAN  Place 2 sprays into the nose daily as needed for congestion (congestion).         Duration of Discharge Encounter: Greater than 30 minutes including physician time.  Angelena Form PA-C 07/29/2014 10:09 AM   Patient seen and examined.  Plan as discussed in my rounding note for today and outlined above. Elaine Le [facility]  07/29/2014  10:34 AM

## 2014-07-31 ENCOUNTER — Encounter (HOSPITAL_COMMUNITY): Payer: Medicare Other

## 2014-07-31 LAB — CULTURE, BLOOD (ROUTINE X 2)
CULTURE: NO GROWTH
Culture: NO GROWTH

## 2014-08-03 ENCOUNTER — Telehealth: Payer: Self-pay | Admitting: Cardiology

## 2014-08-03 NOTE — Telephone Encounter (Signed)
Advised pt take extra 1/2 tablet lasix next 3 days based on Dr Hochrein's recommendation. She will update [place] on Monday.

## 2014-08-03 NOTE — Telephone Encounter (Signed)
Elaine Le was released from the hospital on 07/29/14 and saw Dr, Percival Spanish in the hospital . Was told to call if she gain weight within a week . She has gained 4 pounds since Saturday , she was at 95lbs and now she is at  99lbs. Please call .

## 2014-08-03 NOTE — Telephone Encounter (Signed)
Pt notes she weighed 95 the other day, then 97 more recently, then 99 lbs this AM.  She denies SOB per se but states base of ribcage "feels heavy" - this started yesterday.  When asked about swelling around ankles she denies but states "fluid on top & side" of one foot.  We discussed, I attributed some of this to her resuming normal diet & fluid intake, she was unsure about this.  Informed pt I would defer to Dr. Percival Spanish - will route.

## 2014-08-03 NOTE — Telephone Encounter (Signed)
Have her take an extra 20 mg Lasix for 3 days and let us know after that.  Thanks.

## 2014-08-16 ENCOUNTER — Telehealth: Payer: Self-pay | Admitting: Cardiology

## 2014-08-16 NOTE — Telephone Encounter (Signed)
Pt called in stating that she is retaining fluid and wants to know if her lasix dosage may need to change. Please f/u  Thanks

## 2014-08-16 NOTE — Telephone Encounter (Signed)
Spoke with pt, she has a little edema in her feet but notices most of the fluid buildup in her abdomen. She currently is up 3 lbs. She will take an extra furosemide today. She will call if cont to have fluid buildup. Pt agreed with this plan.

## 2014-08-21 ENCOUNTER — Ambulatory Visit (INDEPENDENT_AMBULATORY_CARE_PROVIDER_SITE_OTHER): Payer: Medicare Other | Admitting: Thoracic Surgery (Cardiothoracic Vascular Surgery)

## 2014-08-21 ENCOUNTER — Encounter: Payer: Self-pay | Admitting: Thoracic Surgery (Cardiothoracic Vascular Surgery)

## 2014-08-21 VITALS — BP 119/72 | HR 78 | Resp 20 | Ht 60.0 in | Wt 107.0 lb

## 2014-08-21 DIAGNOSIS — I340 Nonrheumatic mitral (valve) insufficiency: Secondary | ICD-10-CM

## 2014-08-21 DIAGNOSIS — I5033 Acute on chronic diastolic (congestive) heart failure: Secondary | ICD-10-CM

## 2014-08-21 DIAGNOSIS — I5032 Chronic diastolic (congestive) heart failure: Secondary | ICD-10-CM

## 2014-08-21 DIAGNOSIS — I059 Rheumatic mitral valve disease, unspecified: Secondary | ICD-10-CM | POA: Diagnosis not present

## 2014-08-21 NOTE — Progress Notes (Signed)
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   The patient has no previous history of cardiac disease and had never been told that she had a heart murmur in the past. She denies any known history of rheumatic fever in the past. She has a long-standing history of tobacco abuse with suspected COPD, fibromyalgia with chronic pain, irritable bowel syndrome with chronic constipation, and chronic fatigue. She was admitted to [facility] on 06/23/2014 with acute respiratory failure with hypercapnia requiring ventilator support. She was diagnosed with influenza A and community acquired pneumonia. The patient was quite ill at the time of her initial presentation and required intravenous pressors for blood pressure support in addition to mechanical ventilation. Her subsequent hospitalization and convalescence was slow. She was initially extubated on 06/28/2014 but required reintubation later that same day. She was extubated a second time on 06/30/2014 and required BiPAP support for the next several days. A transthoracic echocardiogram was performed 07/04/2014 that revealed normal left ventricular systolic function with ejection fraction estimated 60-65%. There was a rheumatic appearing mitral valve with a "shaggy density" on the anterior leaflet of the mitral valve raising the question of possible vegetation. The patient was seen in consultation by Dr. Mare Ferrari  from the cardiology team, and transesophageal echocardiogram was recommended. 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 There was no vegetation seen. Outpatient cardiology follow-up was planned. The patient was subsequently discharged from the hospital on 07/11/2014. The patient was not discharged from the hospital on any diuretics or other medications related to her underlying heart disease. Over the subsequent 2 weeks the patient reports the development of gradual progression of shortness of breath, orthopnea, and bilateral lower extremity edema. She also had some flulike symptoms with nonproductive cough and atypical chest pain. Symptoms became acutely worse and the patient presented to the emergency department 07/25/2014. Chest x-ray showed possible right lower lobe pneumonia but symptoms improved rapidly with intravenous diuresis and bronchodilator therapy. The patient was transferred to [facility] where she underwent left and right heart catheterization on 07/27/2014 by Dr. Angelena Form. The patient was found to have normal coronary artery anatomy with non-obstructive coronary artery disease. There was normal left ventricular systolic function with elevated left heart filling pressures and pulmonary hypertension. Cardiothoracic surgical consultation was requested.  She was initially seen in consultation on 07/28/2014.    The patient is single and lives alone in [place]. She has been disabled for more than 20 years because of fibromyalgia with chronic pain and fatigue. She requires long-term narcotic pain relievers. She lives a somewhat sedentary lifestyle but she reports no particular physical limitations other than that caused by fatigue and chronic pain. She has a long-standing history of exertional shortness of breath. She reports occasional tightness across her chest that seems to develop  sporadically and is not related to physical activity. She has  occasional dizzy spells without palpitations or syncope. She has only recently developed severe exertional shortness of breath, PND, orthopnea, and lower extremity edema since her recent hospitalization. With diuretic therapy over the last few weeks she feels much better, although her lasix dose was increased recently due to swelling. She currently reports no shortness of breath. She can lay flat in bed. She has not smoked any cigarettes since she was discharged from the hospital in March.    Current Outpatient Prescriptions  Medication Sig Dispense Refill  . acetaminophen (TYLENOL) 325 MG tablet Take 2 tablets (650 mg total) by mouth every 4 (four) hours as needed for mild pain (temp > 101.5).    Marland Kitchen aspirin 81 MG tablet Take 81 mg by mouth daily. Takes 2 tabs daily    . furosemide (LASIX) 40 MG tablet Take 1 tablet (40 mg total) by mouth daily. 30 tablet 11  . ibuprofen (ADVIL,MOTRIN) 200 MG tablet Take 400 mg by mouth every 6 (six) hours as needed (pain).     Marland Kitchen lactulose (CHRONULAC) 10 GM/15ML solution Take 10-20 g by mouth daily as needed for mild constipation. 1-2 tablespoons daily as needed    . polyethylene glycol powder (GLYCOLAX) powder Take 255 g by mouth daily as needed. (Patient taking differently: Take 1 Container by mouth daily as needed for mild constipation. ) 255 g 12  . potassium chloride SA (K-DUR,KLOR-CON) 20 MEQ tablet Take 1 tablet (20 mEq total) by mouth daily. 30 tablet 11  . PREMARIN vaginal cream Place 1 Applicatorful vaginally daily as needed.   6  . ranitidine (ZANTAC) 150 MG tablet TAKE 1 TABLET BY MOUTH EVERY DAY 60 tablet 10  . sodium chloride (OCEAN) 0.65 % nasal spray Place 2 sprays into the nose daily as needed for congestion (congestion).     . [DISCONTINUED] pantoprazole (PROTONIX) 40 MG tablet Take 1 tablet (40 mg total) by mouth daily. 30 tablet 11   No current facility-administered medications for  this visit.      Physical Exam:   BP 119/72 mmHg  Pulse 78  Resp 20  Ht 5' (1.524 m)  Wt 107 lb (48.535 kg)  BMI 20.90 kg/m2  SpO2 96%  General:  Well-appearing  Chest:   Clear  CV:   Regular rate and rhythm with systolic murmur  Incisions:  n/a  Abdomen:  Soft and nontender  Extremities:  Warm and well perfused, no lower extremity edema  Diagnostic Tests:  CT ANGIOGRAPHY ABDOMEN AND PELVIS  TECHNIQUE: Multidetector CT imaging of the abdomen and pelvis was performed using the standard protocol during bolus administration of intravenous contrast. Multiplanar reconstructed images including MIPs were obtained and reviewed to evaluate the vascular anatomy.  CONTRAST: 80 cc Omnipaque 350  COMPARISON: 07/23/2011  FINDINGS: Arterial findings:  Aorta: Mild-to-moderate wall irregularity and calcification of the lower thoracic and abdominal aorta. Negative for aneurysm, dissection, or occlusive process. No retroperitoneal hemorrhage.  Celiac axis: Minor atherosclerotic change. Widely patent origin.  Superior mesenteric: Minor calcific atherosclerosis of the origin. SMA origin remains widely patent. Ileocolic trunk and jejunal branches are patent throughout the mesenteric.  Left renal: Widely patent.  Right renal: Minor atherosclerotic change. Widely patent.  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 IMA reconstitutes distally via SMA collaterals.  Left iliac: Moderate atherosclerotic change. No dissection, aneurysm or occlusion of the iliac system. Left common, internal and external iliac arteries remain patent. Left common iliac diameter is 6 mm.  Right iliac: Similar moderate  right iliac atherosclerosis. Right common, internal and external iliac arteries are patent. Right common iliac diameter is 7 mm. No dissection, aneurysm or occlusive process.  Left femoral: Left common femoral,  proximal profunda femoral, and proximal superficial femoral arteries demonstrated are patent with minor atherosclerotic change.  Right femoral: Visualize right common femoral, proximal profunda femoral, and proximal superficial femoral arteries demonstrated are patent with minor atherosclerosis.  Venous findings: Venous imaging not performed.  Review of the MIP images confirms the above findings.  Nonvascular findings: Lower chest: Minor dependent basilar atelectasis. Small posterior right diaphragmatic hernia containing only fat. No pleural or pericardial effusion. Normal heart size. No significant hiatal hernia.  Abdomen: Liver, biliary system, gallbladder, pancreas, spleen, and adrenal glands are within normal limits for arterial phase imaging.  Exam of the bowel is limited without oral contrast for a CTA protocol. Negative for bowel obstruction, dilatation, ileus, or free air. Moderate retained stool throughout the colon.  No abdominal free fluid, fluid collection, hemorrhage, abscess or hematoma.  Pelvis: No pelvic free fluid, fluid collection, hemorrhage, abscess, or hematoma. No inguinal abnormality or hernia. Urinary bladder moderately distended. Uterus and adnexal normal in size.  Bones appear osteopenic. Minor lumbar degenerative changes.  IMPRESSION: Diffuse mild to moderate aortoiliac atherosclerosis without occlusive process, aneurysm or dissection. No significant vascular disease to impede endovascular assisted mitral valve replacement.   Electronically Signed  By: Jerilynn Mages. Shick M.D.  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 I have personally reviewed the patient's recent transthoracic and transesophageal echocardiograms and diagnostic cardiac catheterization. The patient has classical rheumatic  features with severe thickening and restricted leaflet mobility involving both the anterior and posterior leaflets of the mitral valve with foreshortening of the subvalvular apparatus. Left ventricular size and systolic function remain normal. The patient does not have significant coronary artery disease. The patient's valvular heart disease is undoubtedly chronic, and the development of symptoms of congestive heart failure have been exacerbated by the patient's recent severe illness with influenza A and community acquired pneumonia. In addition, the patient has long-standing tobacco abuse with likely significant COPD. There is no question that she will ultimately require mitral valve surgery, and the likelihood that her valve could be successfully repaired is relatively low. She appears to be a relatively good candidate for minimally invasive approach for surgery.  At present she is doing fairly well on medical therapy for chronic diastolic congestive heart failure. She continues to abstain from any tobacco use.    Plan:  The patient was counseled at length regarding the indications, risks and potential benefits of mitral valve repair or replacement.  The rationale for elective surgery has been explained, including a comparison between surgery and continued medical therapy with close follow-up.  The likelihood of successful and durable valve repair has been discussed with particular reference to the findings of their recent echocardiogram.  Based upon these findings and previous experience, I have quoted her a <25 percent likelihood of successful valve repair.  In the event that her valve cannot be successfully repaired, we discussed the possibility of replacing the mitral valve using a mechanical prosthesis with the attendant need for long-term anticoagulation versus the alternative of replacing it using a bioprosthetic tissue valve with its potential for late structural valve deterioration and failure,  depending upon the patient's longevity.  At this time the patient specifically requests that if the  mitral valve must be replaced that it be done using a bioprosthetic tissue valve.   She does not feel that she could reliably take Coumadin for the rest of her life.  Alternative surgical approaches have been discussed including a comparison between conventional sternotomy and minimally-invasive techniques.  The relative risks and benefits of each have been reviewed as they pertain to the patient's specific circumstances, and all of their questions have been addressed.  Specific risks potentially related to the minimally-invasive approach were discussed at length, including but not limited to risk of conversion to full or partial sternotomy, aortic dissection or other major vascular complication, unilateral acute lung injury or pulmonary edema, phrenic nerve dysfunction or paralysis, rib fracture, chronic pain, lung hernia, or lymphocele. The patient understands and accepts all potential risks of surgery including but not limited to risk of death, stroke or other neurologic complication, myocardial infarction, congestive heart failure, respiratory failure, renal failure, bleeding requiring transfusion and/or reexploration, arrhythmia, infection or other wound complications, pneumonia, pleural and/or pericardial effusion, pulmonary embolus, aortic dissection or other major vascular complication, or delayed complications related to valve repair or replacement including but not limited to structural valve deterioration and failure, thrombosis, embolization, endocarditis, or paravalvular leak.  The patient desires to wait for another 4-6 weeks before proceeding with surgery in effort to make certain she has recovered completely from her recent illness. Under the circumstances this seems reasonable. In the meanwhile we will obtain formal pulmonary function tests. The importance that she continued to abstain from any  cigarette smoking has been emphasized.  All of her questions have been answered.  I spent in excess of 30 minutes during the conduct of this office consultation and >50% of this time involved direct face-to-face encounter with the patient for counseling and/or coordination of their care.   Valentina Gu. Roxy Manns, MD 08/21/2014 1:02 PM

## 2014-08-23 ENCOUNTER — Telehealth: Payer: Self-pay | Admitting: Pulmonary Disease

## 2014-08-23 ENCOUNTER — Encounter: Payer: Self-pay | Admitting: Cardiology

## 2014-08-23 ENCOUNTER — Ambulatory Visit (INDEPENDENT_AMBULATORY_CARE_PROVIDER_SITE_OTHER): Payer: Medicare Other | Admitting: Pulmonary Disease

## 2014-08-23 ENCOUNTER — Encounter: Payer: Self-pay | Admitting: Pulmonary Disease

## 2014-08-23 ENCOUNTER — Ambulatory Visit (INDEPENDENT_AMBULATORY_CARE_PROVIDER_SITE_OTHER): Payer: Medicare Other | Admitting: Cardiology

## 2014-08-23 ENCOUNTER — Telehealth: Payer: Self-pay | Admitting: Cardiology

## 2014-08-23 VITALS — BP 122/70 | HR 79 | Ht 60.5 in | Wt 106.0 lb

## 2014-08-23 VITALS — BP 110/58 | HR 86 | Ht 60.0 in | Wt 105.0 lb

## 2014-08-23 DIAGNOSIS — J432 Centrilobular emphysema: Secondary | ICD-10-CM | POA: Diagnosis not present

## 2014-08-23 DIAGNOSIS — I340 Nonrheumatic mitral (valve) insufficiency: Secondary | ICD-10-CM | POA: Diagnosis not present

## 2014-08-23 DIAGNOSIS — J449 Chronic obstructive pulmonary disease, unspecified: Secondary | ICD-10-CM

## 2014-08-23 DIAGNOSIS — I059 Rheumatic mitral valve disease, unspecified: Secondary | ICD-10-CM

## 2014-08-23 DIAGNOSIS — Z23 Encounter for immunization: Secondary | ICD-10-CM

## 2014-08-23 DIAGNOSIS — I5021 Acute systolic (congestive) heart failure: Secondary | ICD-10-CM

## 2014-08-23 DIAGNOSIS — Z720 Tobacco use: Secondary | ICD-10-CM

## 2014-08-23 LAB — PULMONARY FUNCTION TEST
DL/VA % pred: 90 %
DL/VA: 3.89 ml/min/mmHg/L
DLCO unc % pred: 76 %
DLCO unc: 14.87 ml/min/mmHg
FEF 25-75 Post: 1.37 L/sec
FEF 25-75 Pre: 0.99 L/sec
FEF2575-%Change-Post: 38 %
FEF2575-%Pred-Post: 57 %
FEF2575-%Pred-Pre: 41 %
FEV1-%Change-Post: 9 %
FEV1-%Pred-Post: 70 %
FEV1-%Pred-Pre: 64 %
FEV1-Post: 1.68 L
FEV1-Pre: 1.54 L
FEV1FVC-%Change-Post: 2 %
FEV1FVC-%Pred-Pre: 90 %
FEV6-%Change-Post: 7 %
FEV6-%Pred-Post: 78 %
FEV6-%Pred-Pre: 72 %
FEV6-Post: 2.3 L
FEV6-Pre: 2.14 L
FEV6FVC-%Change-Post: 0 %
FEV6FVC-%Pred-Post: 102 %
FEV6FVC-%Pred-Pre: 102 %
FVC-%Change-Post: 6 %
FVC-%Pred-Post: 76 %
FVC-%Pred-Pre: 71 %
FVC-Post: 2.31 L
FVC-Pre: 2.16 L
Post FEV1/FVC ratio: 73 %
Post FEV6/FVC ratio: 100 %
Pre FEV1/FVC ratio: 71 %
Pre FEV6/FVC Ratio: 99 %
RV % pred: 111 %
RV: 1.89 L
TLC % pred: 94 %
TLC: 4.26 L

## 2014-08-23 NOTE — Telephone Encounter (Signed)
I called spoke with pt. She received prevnar 13 vaccine today. She wanted to make sure she did get the right one. She reports she did received the reg PNAa vaccine back in November (not prevnar 13). I advised pt then what she received today was a diff strands of PNA in vaccine. She needed nothing further

## 2014-08-23 NOTE — Assessment & Plan Note (Signed)
I was surprised to see surprisingly little airflow obstruction on her home in her function test. The reason for her lengthy admission was primarily influenza pneumonia but she clearly had an exacerbation of her COPD at that time. She now has essentially no symptoms of shortness of breath or wheeze and has not had to use inhalers of any kind since her hospital stay. However, given her smoking history and all the wheezing she had during her hospital visit there is no doubt that she has COPD.  Plan: -Encouraged regular exercise -Prevnar vaccine today -Albuterol as needed -Follow-up 6 months

## 2014-08-23 NOTE — Assessment & Plan Note (Signed)
Quit 3 months ago. I encouraged her to continue to abstain from cigarettes.

## 2014-08-23 NOTE — Progress Notes (Signed)
Subjective:    Patient ID: Elaine Le, female    DOB: [birth year], 54 y.o.   MRN: [id number]  HPI Chief Complaint  Patient presents with  . Advice Only    Referred by [facility] for COPD.  Pt c/o fluid retention in abdomen and feet.      Elaine Le comes to our clinic today to see me again after I took care of her for a lengthy period of time she was hospitalized [facility] long hospital. She was hospitalized primarily for an influenza a case of pneumonia which led to a prolonged mechanical ventilation course. She was successfully extubated and her wheezing was managed with inhaled nebulizers and steroids while hospitalized. During that hospital visit she was found to have an abnormality on her mitral valve as well as mitral valve regurgitation. Unfortunately after that hospital discharge she was readmitted for acute systolic congestive heart failure in the setting of her mitral regurgitation. She was successfully diuresed and discharged home.   She tells me that she quit smoking cigarettes in January and has not started back since. She has been performing all of her activities of daily living at home and not having any shortness of breath, chest pain, or wheezing. She has not used her albuterol inhaler since she left the hospital. She is walking her dog several times a day but only about 1 block at a time.  She is supposed to have surgery to replace her mitral valve in the next few months.  Past Medical History  Diagnosis Date  . Chronic sinusitis   . Arthritis   . Fibromyalgia   . GERD (gastroesophageal reflux disease)   . Irritable bowel syndrome (IBS)   . Headaches, cluster   . Back pain   . Fatigue   . Muscle pain   . Night sweats   . Continuous urine leakage   . Diverticulosis   . Adenomatous colon polyp   . Internal hemorrhoids   . Hiatal hernia   . Pneumonia   . Mitral valve regurgitation     severe  . COPD (chronic obstructive pulmonary disease)-PFT pending   07/17/2014  . Community acquired pneumonia   . CN (constipation) 05/25/2014  . Acute on chronic respiratory failure, unspecified whether with hypoxia or hypercapnia   . Acute respiratory acidosis   . Acute respiratory failure with hypercapnia 06/21/2014  . Acute respiratory failure with hypoxia   . Hx of adenomatous colonic polyps 03/10/2011    Oct 2012, repeat colon Oct 2017   . IBS (irritable bowel syndrome) 01/21/2011  . Influenza A 06/29/2014  . Mitral regurgitation 07/11/2014  . Mitral valve stenosis, moderate 07/11/2014  . Muscular deconditioning 07/05/2014  . Pleural effusion   . Protein-calorie malnutrition, severe 07/06/2014  . Chronic pain syndrome   . Tobacco abuse   . Acute on chronic diastolic heart failure   . Chronic diastolic heart failure   . 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/10/2014    Severe mitral regurgitation with moderate mitral stenosis     Family History  Problem Relation Age of Onset  . Kidney cancer Mother   . Colon polyps Mother   . Irritable bowel syndrome Mother   . Diabetes Sister   . Liver disease Sister   . Irritable bowel syndrome Daughter   . Diabetes Brother   . Colon cancer Neg Hx      History   Social History  . Marital Status: Single    Spouse Name: N/A  . Number  of Children: 1  . Years of Education: N/A   Occupational History  . Not on file.   Social History Main Topics  . Smoking status: Former Smoker -- 0.50 packs/day for 35 years    Types: Cigarettes    Quit date: 06/17/2014  . Smokeless tobacco: Never Used  . Alcohol Use: No     Comment: rare  . Drug Use: No  . Sexual Activity: No   Other Topics Concern  . Not on file   Social History Narrative     Allergies  Allergen Reactions  . Morphine And Related     Altered mental state  . Other     Any antidepressants per pt- causes AMS, anger     Outpatient Prescriptions Prior to Visit  Medication Sig Dispense Refill  . acetaminophen (TYLENOL) 325 MG tablet Take 2  tablets (650 mg total) by mouth every 4 (four) hours as needed for mild pain (temp > 101.5).    Marland Kitchen aspirin 81 MG tablet Take 81 mg by mouth daily. Takes 2 tabs daily    . furosemide (LASIX) 40 MG tablet Take 1 tablet (40 mg total) by mouth daily. 30 tablet 11  . ibuprofen (ADVIL,MOTRIN) 200 MG tablet Take 400 mg by mouth every 6 (six) hours as needed (pain).     Marland Kitchen lactulose (CHRONULAC) 10 GM/15ML solution Take 10-20 g by mouth daily as needed for mild constipation. 1-2 tablespoons daily as needed    . polyethylene glycol powder (GLYCOLAX) powder Take 255 g by mouth daily as needed. (Patient taking differently: Take 1 Container by mouth daily as needed for mild constipation. ) 255 g 12  . potassium chloride SA (K-DUR,KLOR-CON) 20 MEQ tablet Take 1 tablet (20 mEq total) by mouth daily. 30 tablet 11  . PREMARIN vaginal cream Place 1 Applicatorful vaginally daily as needed.   6  . ranitidine (ZANTAC) 150 MG tablet TAKE 1 TABLET BY MOUTH EVERY DAY 60 tablet 10  . sodium chloride (OCEAN) 0.65 % nasal spray Place 2 sprays into the nose daily as needed for congestion (congestion).      No facility-administered medications prior to visit.      Review of Systems  Constitutional: Negative for fever and unexpected weight change.  HENT: Positive for dental problem and rhinorrhea. Negative for congestion, ear pain, nosebleeds, postnasal drip, sinus pressure, sneezing, sore throat and trouble swallowing.   Eyes: Negative for redness and itching.  Respiratory: Negative for cough, chest tightness, shortness of breath and wheezing.   Cardiovascular: Negative for palpitations and leg swelling.  Gastrointestinal: Positive for abdominal pain. Negative for nausea and vomiting.  Genitourinary: Negative for dysuria.  Musculoskeletal: Negative for joint swelling.  Skin: Negative for rash.  Neurological: Negative for headaches.  Hematological: Does not bruise/bleed easily.  Psychiatric/Behavioral: Negative for  dysphoric mood. The patient is not nervous/anxious.        Objective:   Physical Exam Filed Vitals:   08/23/14 1324  BP: 122/70  Pulse: 79  Height: 5' 0.5" (1.537 m)  Weight: 106 lb (48.081 kg)  SpO2: 100%   RA  Gen: well appearing, no acute distress HENT: NCAT, OP clear, neck supple without masses Eyes: PERRL, EOMi Lymph: no cervical lymphadenopathy PULM: CTA B CV: RRR, slight systolic murmur, no JVD GI: BS+, soft, nontender, no hsm Derm: no rash or skin breakdown MSK: normal bulk and tone Neuro: A&Ox4, CN II-XII intact, strength 5/5 in all 4 extremities Psyche: normal mood and affect  Records from her  recent hospitalization for acute systolic heart failure were reviewed Chest x-ray from her recent hospitalization was reviewed showing resolving interstitial edema Chest x-ray images from January 2016 were reviewed again today in clinic findings were consistent with pneumonia as well as mild to moderate centrilobular emphysema     Assessment & Plan:   COPD (chronic obstructive pulmonary disease)-mild I was surprised to see surprisingly little airflow obstruction on her home in her function test. The reason for her lengthy admission was primarily influenza pneumonia but she clearly had an exacerbation of her COPD at that time. She now has essentially no symptoms of shortness of breath or wheeze and has not had to use inhalers of any kind since her hospital stay. However, given her smoking history and all the wheezing she had during her hospital visit there is no doubt that she has COPD.  Plan: -Encouraged regular exercise -Prevnar vaccine today -Albuterol as needed -Follow-up 6 months   Acute congestive heart failure secondary to severe MR Fortunately this problem has resolved and she is euvolemic on exam today. However, she has an upcoming mitral valve surgery. From a pulmonary standpoint she should do well with this. I recommend that she use albuterol on an as-needed  basis during that procedure. However, she is still quite deconditioned and so I have encouraged her to start exercising aggressively so that she is walking at least 1 mile per day prior to her surgery.   Tobacco abuse Quit 3 months ago. I encouraged her to continue to abstain from cigarettes.     Updated Medication List Outpatient Encounter Prescriptions as of 08/23/2014  Medication Sig  . acetaminophen (TYLENOL) 325 MG tablet Take 2 tablets (650 mg total) by mouth every 4 (four) hours as needed for mild pain (temp > 101.5).  Marland Kitchen aspirin 81 MG tablet Take 81 mg by mouth daily. Takes 2 tabs daily  . furosemide (LASIX) 40 MG tablet Take 1 tablet (40 mg total) by mouth daily.  Marland Kitchen ibuprofen (ADVIL,MOTRIN) 200 MG tablet Take 400 mg by mouth every 6 (six) hours as needed (pain).   Marland Kitchen lactulose (CHRONULAC) 10 GM/15ML solution Take 10-20 g by mouth daily as needed for mild constipation. 1-2 tablespoons daily as needed  . polyethylene glycol powder (GLYCOLAX) powder Take 255 g by mouth daily as needed. (Patient taking differently: Take 1 Container by mouth daily as needed for mild constipation. )  . potassium chloride SA (K-DUR,KLOR-CON) 20 MEQ tablet Take 1 tablet (20 mEq total) by mouth daily.  Marland Kitchen PREMARIN vaginal cream Place 1 Applicatorful vaginally daily as needed.   . ranitidine (ZANTAC) 150 MG tablet TAKE 1 TABLET BY MOUTH EVERY DAY  . sodium chloride (OCEAN) 0.65 % nasal spray Place 2 sprays into the nose daily as needed for congestion (congestion).

## 2014-08-23 NOTE — Telephone Encounter (Signed)
Pt. To see Dr. Martinique at the [address] office today at 3:45

## 2014-08-23 NOTE — Progress Notes (Signed)
Elaine Le Date of Birth: [birth year] Medical Record #[id number]  History of Present Illness: Elaine Le is seen for follow up RHD. She was seen in consult when hospitalized in March when she was readmitted with acute respiratory failure and CHF. She was initially admitted with acute inluenza with PNA and was intubated. She improved and was DC after 2 weeks. She was readmitted 2 weeks later with recurrent respiratory failure more consistent with CHF. She was found to have RHD with severe MR and mild MS. Cardiac cath showed moderate pulmonary HTN and no significant CAD. She was treated with diuretics with improvement. She has been followed up by Dr. Roxy Manns for consideration of MV surgery. On follow up today she is doing very well. States her breathing is great. No edema or cough. Quit smoking. Saw Dr. [place] Bells today and PFTs done showing mild COPD. She did have recent dental check up.    Medication List       This list is accurate as of: 08/23/14  5:15 PM.  Always use your most recent med list.               acetaminophen 325 MG tablet  Commonly known as:  TYLENOL  Take 2 tablets (650 mg total) by mouth every 4 (four) hours as needed for mild pain (temp > 101.5).     aspirin 81 MG tablet  Take 81 mg by mouth daily. Takes 2 tabs daily     furosemide 40 MG tablet  Commonly known as:  LASIX  Take 1 tablet (40 mg total) by mouth daily.     ibuprofen 200 MG tablet  Commonly known as:  ADVIL,MOTRIN  Take 400 mg by mouth every 6 (six) hours as needed (pain).     lactulose 10 GM/15ML solution  Commonly known as:  CHRONULAC  Take 10-20 g by mouth daily as needed for mild constipation. 1-2 tablespoons daily as needed     polyethylene glycol powder powder  Commonly known as:  GLYCOLAX  Take 255 g by mouth daily as needed.     potassium chloride SA 20 MEQ tablet  Commonly known as:  K-DUR,KLOR-CON  Take 1 tablet (20 mEq total) by mouth daily.     PREMARIN vaginal cream    Generic drug:  conjugated estrogens  Place 1 Applicatorful vaginally daily as needed.     ranitidine 150 MG tablet  Commonly known as:  ZANTAC  TAKE 1 TABLET BY MOUTH EVERY DAY     sodium chloride 0.65 % nasal spray  Commonly known as:  OCEAN  Place 2 sprays into the nose daily as needed for congestion (congestion).         Allergies  Allergen Reactions  . Morphine And Related     Altered mental state  . Other     Any antidepressants per pt- causes AMS, anger    Past Medical History  Diagnosis Date  . Chronic sinusitis   . Arthritis   . Fibromyalgia   . GERD (gastroesophageal reflux disease)   . Irritable bowel syndrome (IBS)   . Headaches, cluster   . Back pain   . Fatigue   . Muscle pain   . Night sweats   . Continuous urine leakage   . Diverticulosis   . Adenomatous colon polyp   . Internal hemorrhoids   . Hiatal hernia   . Pneumonia   . Mitral valve regurgitation     severe  . COPD (chronic obstructive pulmonary disease)-PFT pending  07/17/2014  . Community acquired pneumonia   . CN (constipation) 05/25/2014  . Acute on chronic respiratory failure, unspecified whether with hypoxia or hypercapnia   . Acute respiratory acidosis   . Acute respiratory failure with hypercapnia 06/21/2014  . Acute respiratory failure with hypoxia   . Hx of adenomatous colonic polyps 03/10/2011    Oct 2012, repeat colon Oct 2017   . IBS (irritable bowel syndrome) 01/21/2011  . Influenza A 06/29/2014  . Mitral regurgitation 07/11/2014  . Mitral valve stenosis, moderate 07/11/2014  . Muscular deconditioning 07/05/2014  . Pleural effusion   . Protein-calorie malnutrition, severe 07/06/2014  . Chronic pain syndrome   . Tobacco abuse   . Acute on chronic diastolic heart failure   . Chronic diastolic heart failure   . 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/10/2014    Severe mitral regurgitation with moderate mitral stenosis    Past Surgical History  Procedure Laterality Date  .  Thoracic outlet surgery    . Tubal ligation    . Vulva surgery    . Neuroplasty / transposition median nerve at carpal tunnel bilateral    . Cystostomy w/ bladder dilation      at age 11  . Tee without cardioversion N/A 07/10/2014    Procedure: TRANSESOPHAGEAL ECHOCARDIOGRAM (TEE);  Surgeon: Lelon Perla, MD;  Location[facility] ENDOSCOPY;  Service: Cardiovascular;  Laterality: N/A;  . Cardiac catheterization  07/27/2014  . Left and right heart catheterization with coronary angiogram N/A 07/27/2014    Procedure: LEFT AND RIGHT HEART CATHETERIZATION WITH CORONARY ANGIOGRAM;  Surgeon: Burnell Blanks, MD;  Location: [facility] CATH LAB;  Service: Cardiovascular;  Laterality: N/A;    History   Social History  . Marital Status: Single    Spouse Name: N/A  . Number of Children: 1  . Years of Education: N/A   Social History Main Topics  . Smoking status: Former Smoker -- 0.50 packs/day for 35 years    Types: Cigarettes    Quit date: 06/17/2014  . Smokeless tobacco: Never Used  . Alcohol Use: No     Comment: rare  . Drug Use: No  . Sexual Activity: No   Other Topics Concern  . None   Social History Narrative    Family History  Problem Relation Age of Onset  . Kidney cancer Mother   . Colon polyps Mother   . Irritable bowel syndrome Mother   . Diabetes Sister   . Liver disease Sister   . Irritable bowel syndrome Daughter   . Diabetes Brother   . Colon cancer Neg Hx     Review of Systems: As noted in HPI.  All other systems were reviewed and are negative.  Physical Exam: BP 110/58 mmHg  Pulse 86  Ht 5' (1.524 m)  Wt 105 lb (47.628 kg)  BMI 20.51 kg/m2 Filed Weights   08/23/14 1544  Weight: 105 lb (47.628 kg)  GENERAL:  Well appearing WF in NAD HEENT:  PERRL, EOMI, sclera are clear. Oropharynx is clear. NECK:  No jugular venous distention, carotid upstroke brisk and symmetric, no bruits, no thyromegaly or adenopathy LUNGS:  Clear to auscultation bilaterally CHEST:   Unremarkable HEART:  RRR,  PMI not displaced or sustained,S1 and S2 within normal limits, no S3, no S4: 2/6 systolic murmur at the apex to left axilla. ABD:  Soft, nontender. BS +, no masses or bruits. No hepatomegaly, no splenomegaly EXT:  2 + pulses throughout, no edema, no cyanosis no clubbing SKIN:  Warm and dry.  No rashes NEURO:  Alert and oriented x 3. Cranial nerves II through XII intact. PSYCH:  Cognitively intact    LABORATORY DATA: Cardiac Catheterization Operative Report  AKIBA MELFI [id number] 3/31/20169:14 AM [name], MD  Procedure Performed:  1. Left Heart Catheterization 2. Selective Coronary Angiography 3. Right Heart Catheterization 4. Left ventricular angiogram  Operator: Lauree Chandler, MD  Indication: 54 yo female with history of rheumatic heart disease with moderate MS and severe MR.   Procedure Details: The risks, benefits, complications, treatment options, and expected outcomes were discussed with the patient. The patient and/or family concurred with the proposed plan, giving informed consent. The patient was brought to the cath lab after IV hydration was begun and oral premedication was given. The patient was further sedated with Versed and Fentanyl. The right groin was prepped and draped in the usual manner. Using the modified Seldinger access technique, a 5 French sheath was placed in the right femoral artery. A 7 French sheath was inserted into the right femoral vein. A balloon tipped catheter was used to perform a right heart catheterization. Standard diagnostic catheters were used to perform selective coronary angiography. A pigtail catheter was used to perform a left ventricular angiogram. There were no immediate complications. The patient was taken to the recovery area in stable condition.   Hemodynamic Findings: Ao: 85/47  LV: 79/4/11 RA: 12  RV: 50/7/17 PA: 48/29 (mean  38) PCWP: 33  Fick Cardiac Output: 5.2 L/min Fick Cardiac Index: 3.67 L/min/m2 Central Aortic Saturation: 100% Pulmonary Artery Saturation: 77%  Angiographic Findings:  Left main: Short segment without obstructive disease.   Left Anterior Descending Artery: Large caliber vessel that courses to the apex. The proximal and mid vessel has diffuse 20% stenosis.   Circumflex Artery: Moderate caliber vessel with moderate caliber intermediate branch. The intermediate branch is free of obstructive disease. The AV groove Circumflex has a distal 20% stenosis.   Right Coronary Artery: Moderate caliber co-dominant vessel with no focally obstructive disease. There is intense spasm noted in the proximal vessel with catheter engagement that resolves with IC NTG.   Left Ventricular Angiogram: LVEF=65-70%. 3+ mitral regurgitation noted.   Impression: 1. Mild non-obstructive CAD 2. Normal LV systolic function 3. Severe mitral regurgitation leading to elevated filling pressures  Transesophageal Echocardiography  Patient:  Syble, Picco MR #:    [id number] Study Date: 07/10/2014 Gender:   F Age:    37 Height:   162.6 cm Weight:   43.2 kg BSA:    1.38 m^2 Pt. Status: Room:    [place] SONOGRAPHER Florentina Jenny, RDCS ADMITTING  [place], [place]  Simonne Maffucci B ORDERING   [place], Christopher REFERRING  [place], Christopher  cc:  ------------------------------------------------------------------- LV EF: 55% -  60%  ------------------------------------------------------------------- Indications:   424.0 Mitral valve disease.  ------------------------------------------------------------------- Study Conclusions  - Left ventricle: Systolic function was normal. The estimated ejection fraction was in the range of 55% to 60%. Wall motion was normal; there were no regional wall motion  abnormalities. - Aortic valve: There was trivial regurgitation. - Mitral valve: The findings are consistent with moderate stenosis. There was severe regurgitation. Valve area by pressure half-time: 1.55 cm^2. - Left atrium: The atrium was moderately to severely dilated. - Right atrium: No evidence of thrombus in the atrial cavity or appendage. - Atrial septum: No defect or patent foramen ovale was identified. - Tricuspid valve: No evidence of vegetation. - Pulmonic valve: No evidence  of vegetation.  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  Diagnostic transesophageal echocardiography. 2D and color Doppler. Birthdate: Patient birthdate: [birth year]. Age: Patient is 54 yr old. Sex: Gender: female.  BMI: 16.3 kg/m^2. Blood pressure: 87/51 Patient status: Inpatient. Study date: Study date: 07/10/2014. Study time: 10:41 AM. Location: Endoscopy.  -------------------------------------------------------------------  ------------------------------------------------------------------- Left ventricle: Systolic function was normal. The estimated ejection fraction was in the range of 55% to 60%. Wall motion was normal; there were no regional wall motion abnormalities.  ------------------------------------------------------------------- Aortic valve:  Trileaflet. Doppler:  There was no stenosis. There was trivial regurgitation.  ------------------------------------------------------------------- Aorta: Descending aorta: Mild atherosclerosis.  ------------------------------------------------------------------- Mitral valve: Thickened, rheumatic mitral valve. Doming of anterior leaflet. Restricted posterior leaflet. Doppler: The findings are consistent with moderate stenosis. Gradient overestimates severity due to mitral regurgitation. There was severe regurgitation.  Valve area by  pressure half-time: 1.55 cm^2. Indexed valve area by pressure half-time: 1.12 cm^2/m^2. Mean gradient (D): 17 mm Hg.  ------------------------------------------------------------------- Left atrium: The atrium was moderately to severely dilated.  ------------------------------------------------------------------- Atrial septum: No defect or patent foramen ovale was identified.  ------------------------------------------------------------------- Right ventricle: The cavity size was normal. Systolic function was normal.  ------------------------------------------------------------------- Pulmonic valve:  Structurally normal valve.  Cusp separation was normal. No evidence of vegetation. Doppler: There was trivial regurgitation.  ------------------------------------------------------------------- Tricuspid valve:  Structurally normal valve.  Leaflet separation was normal. No evidence of vegetation. Doppler: There was mild regurgitation.  ------------------------------------------------------------------- Right atrium: The atrium was normal in size. No evidence of thrombus in the atrial cavity or appendage.  ------------------------------------------------------------------- Pericardium: There was no pericardial effusion.  ------------------------------------------------------------------- Measurements  Mitral valve            Value Mitral mean velocity, D       197  cm/s Mitral pressure half-time      115  ms Mitral mean gradient, D       17  mm Hg Mitral valve area, PHT, DP     1.55 cm^2 Mitral valve area/bsa, PHT, DP   1.12 cm^2/m^2 Mitral annulus VTI, D        84.5 cm  Legend: (L) and (H) mark values outside specified reference range.  ------------------------------------------------------------------- Prepared and Electronically Authenticated by  Kirk Ruths  Assessment / Plan: 1. RHD  with severe MR and mild to moderate MS. Moderate pulmonary HTN. Clinically well compensated on diuretic therapy. Recommend MV surgery for long term benefit. Patient is to follow up with Dr. Roxy Manns in June. Still considering type of valve. Would prefer a tissue valve. We discussed issues of longevity of mechanical valve vs. Tissue but need to take Coumadin. She is going to think more about it. She is up to date on dental work. Will need long term SBE prophylaxis.  2. Mild COPD 3. Tobacco abuse. Now quit.   Anticipate Valve surgery in June. Will likely need replacement. I will follow up post op.

## 2014-08-23 NOTE — Telephone Encounter (Signed)
Patient states she has a red streak running up her right arm.  She states she had a procedure done earlier this month--she saw Dr. Roxy Manns yesterday and he advised her to see Dr. Percival Spanish.  I offered her an appointment today @ 9:30 am with out PA, but she states she cannot come in today.  Please call.

## 2014-08-23 NOTE — Patient Instructions (Signed)
Take albuterol 2 puffs every 4-6 hours as needed for shortness of breath Exercise regularly, make a goal to walk 1 mile per day before your heart surgery Stay away from cigarettes We will see you back in 6 months or sooner if needed

## 2014-08-23 NOTE — Progress Notes (Signed)
PFT done today. 

## 2014-08-23 NOTE — Patient Instructions (Signed)
Continue your current therapy   I will see you in 3 months. 

## 2014-08-23 NOTE — Assessment & Plan Note (Signed)
Fortunately this problem has resolved and she is euvolemic on exam today. However, she has an upcoming mitral valve surgery. From a pulmonary standpoint she should do well with this. I recommend that she use albuterol on an as-needed basis during that procedure. However, she is still quite deconditioned and so I have encouraged her to start exercising aggressively so that she is walking at least 1 mile per day prior to her surgery.

## 2014-09-27 DIAGNOSIS — I5021 Acute systolic (congestive) heart failure: Secondary | ICD-10-CM

## 2014-09-27 HISTORY — DX: Acute systolic (congestive) heart failure: I50.21

## 2014-10-02 ENCOUNTER — Encounter: Payer: Self-pay | Admitting: Thoracic Surgery (Cardiothoracic Vascular Surgery)

## 2014-10-02 ENCOUNTER — Ambulatory Visit (INDEPENDENT_AMBULATORY_CARE_PROVIDER_SITE_OTHER): Payer: Medicare Other | Admitting: Thoracic Surgery (Cardiothoracic Vascular Surgery)

## 2014-10-02 VITALS — BP 130/78 | HR 77 | Resp 20 | Ht 60.0 in | Wt 105.0 lb

## 2014-10-02 DIAGNOSIS — I059 Rheumatic mitral valve disease, unspecified: Secondary | ICD-10-CM

## 2014-10-02 DIAGNOSIS — I340 Nonrheumatic mitral (valve) insufficiency: Secondary | ICD-10-CM

## 2014-10-02 NOTE — Progress Notes (Signed)
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  She was last seen here in our office on 08/21/2014. At that time she was doing well and she elected to hold off for at least another 6 weeks before considering elective mitral valve repair or replacement. Over the past month she has been seen in follow-up by Dr. Lake Bells and Dr. [place].  Clinically she has continued to do very well and she returns to our office for further follow-up today. She still has mild exertional shortness of breath. However, the patient only get short of breath with more strenuous activity and this does not typically limit her physical activity too much. She denies any resting shortness of breath, PND, orthopnea, or lower extremity edema. She has not had palpitations, dizzy spells, nor syncope. She has continued to abstain from cigarettes smoking. She denies any cough or symptoms of congestion. Overall she feels fairly well.   Current Outpatient Prescriptions  Medication Sig Dispense Refill  . acetaminophen (TYLENOL) 325 MG tablet Take 2 tablets (650 mg total) by mouth every 4 (four) hours as needed for mild pain (temp > 101.5).    Marland Kitchen aspirin 81 MG tablet Take 81 mg by mouth daily. Takes 2 tabs daily    . furosemide (LASIX) 40 MG tablet Take 1 tablet (40 mg total) by mouth daily. 30 tablet 11  . ibuprofen (ADVIL,MOTRIN) 200 MG tablet Take 400 mg by mouth every 6 (six) hours as needed (pain).     .  polyethylene glycol powder (GLYCOLAX) powder Take 255 g by mouth daily as needed. (Patient taking differently: Take 1 Container by mouth daily as needed for mild constipation. ) 255 g 12  . potassium chloride SA (K-DUR,KLOR-CON) 20 MEQ tablet Take 1 tablet (20 mEq total) by mouth daily. 30 tablet 11  . PREMARIN vaginal cream Place 1 Applicatorful vaginally daily as needed.   6  . ranitidine (ZANTAC) 150 MG tablet TAKE 1 TABLET BY MOUTH EVERY DAY 60 tablet 10  . sodium chloride (OCEAN) 0.65 % nasal spray Place 2 sprays into the nose daily as needed for congestion (congestion).     . [DISCONTINUED] pantoprazole (PROTONIX) 40 MG tablet Take 1 tablet (40 mg total) by mouth daily. 30 tablet 11   No current facility-administered medications for this visit.      Physical Exam:   BP 130/78 mmHg  Pulse 77  Resp 20  Ht 5' (1.524 m)  Wt 105 lb (47.628 kg)  BMI 20.51 kg/m2  SpO2 97%  General:  Well-appearing  Chest:   Clear to auscultation  CV:   Regular rate and rhythm, II/VI systolic murmur at apex  Incisions:  n/a  Abdomen:  Soft and nontender  Extremities:  Warm and well-perfused  Diagnostic Tests:  n/a   Impression:  Patient has stage D severe symptomatic primary mitral regurgitation with mild to moderate mitral stenosis due to rheumatic mitral valve disease. I  have personally reviewed the patient's recent transthoracic and transesophageal echocardiograms and diagnostic cardiac catheterization. The patient has classical rheumatic features with severe thickening and restricted leaflet mobility involving both the anterior and posterior leaflets of the mitral valve with foreshortening of the subvalvular apparatus. Left ventricular size and systolic function remain normal. The patient does not have significant coronary artery disease. The patient's valvular heart disease is undoubtedly chronic, and the development of symptoms of congestive heart failure were exacerbated by the patient's  recent severe illness with influenza A and community acquired pneumonia. In addition, the patient has long-standing tobacco abuse with COPD, although recent follow up PFT's revealed only mild airflow obstruction.  She appears to be a relatively good candidate for minimally invasive approach for surgery.   At present the patient continues to do well on medical therapy for chronic diastolic congestive heart failure with only mild symptoms of exertional shortness of breath with more strenuous activity. She continues to abstain from any tobacco use.  Options include continued close observation on medical therapy versus proceeding with minimally invasive mitral valve repair or replacement in the near future.  There is no question that she will ultimately require mitral valve surgery, but the likelihood that her valve could be successfully repaired with anticipation of excellent long-term result is muted by the presence of severe rheumatic disease.    Plan:  The patient was again counseled at length regarding the indications, risks and potential benefits of mitral valve repair or replacement. The rationale for elective surgery has been explained, including a comparison between surgery and continued medical therapy with close follow-up. The likelihood of successful and durable valve repair has been discussed with particular reference to the findings of their recent echocardiogram. Based upon these findings and previous experience, I have quoted her a <25 percent likelihood of successful valve repair. In the event that her valve cannot be successfully repaired, we discussed the possibility of replacing the mitral valve using a mechanical prosthesis with the attendant need for long-term anticoagulation versus the alternative of replacing it using a bioprosthetic tissue valve with its potential for late structural valve deterioration and failure, depending upon the patient's longevity. The patient specifically  requests that if the mitral valve must be replaced that it be done using a bioprosthetic tissue valve. She does not feel that she could reliably take Coumadin for the rest of her life. Under the circumstances, we might consider an attempted valve repair if at all feasible, although there is still a high likelihood that her valve would need to be replaced.  If the patient decides to hold off on proceeding with surgery in the near future she will need to be followed very closely. All of her questions have been addressed. The patient desires to think matters over and discuss things further with her family members.  She will call and return to see [place] in the near future if she decides to proceed with surgery. Alternatively, if she plans to hold off on surgery I have instructed her to make certain she has a follow-up appointment scheduled in the near future at [facility]    I spent in excess of 30 minutes during the conduct of this office consultation and >50% of this time involved direct face-to-face encounter with the patient for counseling and/or coordination of their care.   Elaine Le. Roxy Manns, MD 10/02/2014 5:11 PM

## 2014-10-05 ENCOUNTER — Other Ambulatory Visit: Payer: Self-pay | Admitting: *Deleted

## 2014-10-05 DIAGNOSIS — I340 Nonrheumatic mitral (valve) insufficiency: Secondary | ICD-10-CM

## 2014-10-06 ENCOUNTER — Emergency Department (HOSPITAL_COMMUNITY): Payer: Medicare Other

## 2014-10-06 ENCOUNTER — Inpatient Hospital Stay (HOSPITAL_COMMUNITY)
Admission: EM | Admit: 2014-10-06 | Discharge: 2014-10-10 | DRG: 293 | Disposition: A | Discharge status: Home / Self Care | Payer: Medicare Other | Attending: Cardiology | Admitting: Cardiology

## 2014-10-06 ENCOUNTER — Encounter (HOSPITAL_COMMUNITY): Payer: Self-pay

## 2014-10-06 DIAGNOSIS — Z9851 Tubal ligation status: Secondary | ICD-10-CM

## 2014-10-06 DIAGNOSIS — I050 Rheumatic mitral stenosis: Secondary | ICD-10-CM | POA: Diagnosis present

## 2014-10-06 DIAGNOSIS — J9621 Acute and chronic respiratory failure with hypoxia: Secondary | ICD-10-CM | POA: Diagnosis present

## 2014-10-06 DIAGNOSIS — Z87891 Personal history of nicotine dependence: Secondary | ICD-10-CM

## 2014-10-06 DIAGNOSIS — R51 Headache: Secondary | ICD-10-CM | POA: Diagnosis not present

## 2014-10-06 DIAGNOSIS — I2510 Atherosclerotic heart disease of native coronary artery without angina pectoris: Secondary | ICD-10-CM | POA: Diagnosis present

## 2014-10-06 DIAGNOSIS — I10 Essential (primary) hypertension: Secondary | ICD-10-CM | POA: Diagnosis present

## 2014-10-06 DIAGNOSIS — G894 Chronic pain syndrome: Secondary | ICD-10-CM | POA: Diagnosis present

## 2014-10-06 DIAGNOSIS — I5031 Acute diastolic (congestive) heart failure: Secondary | ICD-10-CM | POA: Diagnosis not present

## 2014-10-06 DIAGNOSIS — M1990 Unspecified osteoarthritis, unspecified site: Secondary | ICD-10-CM | POA: Diagnosis present

## 2014-10-06 DIAGNOSIS — K219 Gastro-esophageal reflux disease without esophagitis: Secondary | ICD-10-CM | POA: Diagnosis present

## 2014-10-06 DIAGNOSIS — Z86010 Personal history of colonic polyps: Secondary | ICD-10-CM | POA: Diagnosis not present

## 2014-10-06 DIAGNOSIS — Z885 Allergy status to narcotic agent status: Secondary | ICD-10-CM | POA: Diagnosis not present

## 2014-10-06 DIAGNOSIS — I5032 Chronic diastolic (congestive) heart failure: Secondary | ICD-10-CM | POA: Diagnosis present

## 2014-10-06 DIAGNOSIS — J811 Chronic pulmonary edema: Secondary | ICD-10-CM | POA: Diagnosis present

## 2014-10-06 DIAGNOSIS — J432 Centrilobular emphysema: Secondary | ICD-10-CM

## 2014-10-06 DIAGNOSIS — Z7982 Long term (current) use of aspirin: Secondary | ICD-10-CM

## 2014-10-06 DIAGNOSIS — I340 Nonrheumatic mitral (valve) insufficiency: Secondary | ICD-10-CM

## 2014-10-06 DIAGNOSIS — I509 Heart failure, unspecified: Secondary | ICD-10-CM

## 2014-10-06 DIAGNOSIS — J810 Acute pulmonary edema: Secondary | ICD-10-CM

## 2014-10-06 DIAGNOSIS — I5043 Acute on chronic combined systolic (congestive) and diastolic (congestive) heart failure: Secondary | ICD-10-CM | POA: Diagnosis present

## 2014-10-06 DIAGNOSIS — J449 Chronic obstructive pulmonary disease, unspecified: Secondary | ICD-10-CM | POA: Diagnosis present

## 2014-10-06 DIAGNOSIS — R112 Nausea with vomiting, unspecified: Secondary | ICD-10-CM | POA: Diagnosis present

## 2014-10-06 DIAGNOSIS — J0100 Acute maxillary sinusitis, unspecified: Secondary | ICD-10-CM | POA: Diagnosis not present

## 2014-10-06 HISTORY — DX: Acute systolic (congestive) heart failure: I50.21

## 2014-10-06 LAB — BLOOD GAS, ARTERIAL
ACID-BASE DEFICIT: 4.2 mmol/L — AB (ref 0.0–2.0)
Bicarbonate: 21.6 mEq/L (ref 20.0–24.0)
DRAWN BY: 11249
Delivery systems: POSITIVE
EXPIRATORY PAP: 5
FIO2: 0.6 %
Inspiratory PAP: 10
LHR: 14 {breaths}/min
Mode: POSITIVE
O2 SAT: 98.3 %
PH ART: 7.32 — AB (ref 7.350–7.450)
Patient temperature: 97.4
TCO2: 19.3 mmol/L (ref 0–100)
pCO2 arterial: 42.7 mmHg (ref 35.0–45.0)
pO2, Arterial: 181 mmHg — ABNORMAL HIGH (ref 80.0–100.0)

## 2014-10-06 LAB — COMPREHENSIVE METABOLIC PANEL
ALT: 26 U/L (ref 14–54)
AST: 32 U/L (ref 15–41)
Albumin: 4.5 g/dL (ref 3.5–5.0)
Alkaline Phosphatase: 104 U/L (ref 38–126)
Anion gap: 8 (ref 5–15)
BILIRUBIN TOTAL: 0.4 mg/dL (ref 0.3–1.2)
BUN: 12 mg/dL (ref 6–20)
CALCIUM: 8.9 mg/dL (ref 8.9–10.3)
CO2: 23 mmol/L (ref 22–32)
Chloride: 103 mmol/L (ref 101–111)
Creatinine, Ser: 0.59 mg/dL (ref 0.44–1.00)
GFR calc Af Amer: >60 mL/min (ref >60)
GFR calc non Af Amer: >60 mL/min (ref >60)
GLUCOSE: 114 mg/dL — AB (ref 65–99)
POTASSIUM: 3.6 mmol/L (ref 3.5–5.1)
SODIUM: 134 mmol/L — AB (ref 135–145)
TOTAL PROTEIN: 7.8 g/dL (ref 6.5–8.1)

## 2014-10-06 LAB — BASIC METABOLIC PANEL
Anion gap: 8 (ref 5–15)
BUN: 6 mg/dL (ref 6–20)
CO2: 27 mmol/L (ref 22–32)
CREATININE: 0.64 mg/dL (ref 0.44–1.00)
Calcium: 8.7 mg/dL — ABNORMAL LOW (ref 8.9–10.3)
Chloride: 100 mmol/L — ABNORMAL LOW (ref 101–111)
Glucose, Bld: 118 mg/dL — ABNORMAL HIGH (ref 65–99)
Potassium: 4.1 mmol/L (ref 3.5–5.1)
Sodium: 135 mmol/L (ref 135–145)

## 2014-10-06 LAB — CBC WITH DIFFERENTIAL/PLATELET
Basophils Absolute: 0.1 10*3/uL (ref 0.0–0.1)
Basophils Relative: 1 % (ref 0–1)
EOS PCT: 1 % (ref 0–5)
Eosinophils Absolute: 0.2 10*3/uL (ref 0.0–0.7)
HCT: 46.7 % — ABNORMAL HIGH (ref 36.0–46.0)
Hemoglobin: 15.3 g/dL — ABNORMAL HIGH (ref 12.0–15.0)
Lymphocytes Relative: 35 % (ref 12–46)
Lymphs Abs: 4.7 10*3/uL — ABNORMAL HIGH (ref 0.7–4.0)
MCH: 29.5 pg (ref 26.0–34.0)
MCHC: 32.8 g/dL (ref 30.0–36.0)
MCV: 90.2 fL (ref 78.0–100.0)
MONO ABS: 0.8 10*3/uL (ref 0.1–1.0)
Monocytes Relative: 6 % (ref 3–12)
NEUTROS PCT: 57 % (ref 43–77)
Neutro Abs: 7.6 10*3/uL (ref 1.7–7.7)
PLATELETS: 355 10*3/uL (ref 150–400)
RBC: 5.18 MIL/uL — ABNORMAL HIGH (ref 3.87–5.11)
RDW: 13 % (ref 11.5–15.5)
WBC: 13.4 10*3/uL — AB (ref 4.0–10.5)

## 2014-10-06 LAB — I-STAT TROPONIN, ED: TROPONIN I, POC: 0.01 ng/mL (ref 0.00–0.08)

## 2014-10-06 LAB — CBC
HEMATOCRIT: 41.9 % (ref 36.0–46.0)
Hemoglobin: 13.8 g/dL (ref 12.0–15.0)
MCH: 29.4 pg (ref 26.0–34.0)
MCHC: 32.9 g/dL (ref 30.0–36.0)
MCV: 89.3 fL (ref 78.0–100.0)
Platelets: 314 10*3/uL (ref 150–400)
RBC: 4.69 MIL/uL (ref 3.87–5.11)
RDW: 13.3 % (ref 11.5–15.5)
WBC: 8 10*3/uL (ref 4.0–10.5)

## 2014-10-06 LAB — BRAIN NATRIURETIC PEPTIDE
B NATRIURETIC PEPTIDE 5: 187.6 pg/mL — AB (ref 0.0–100.0)
B Natriuretic Peptide: 309.4 pg/mL — ABNORMAL HIGH (ref 0.0–100.0)

## 2014-10-06 LAB — D-DIMER, QUANTITATIVE (NOT AT ARMC): D-Dimer, Quant: <0.27 ug/mL-FEU (ref 0.00–0.48)

## 2014-10-06 MED ORDER — SALINE SPRAY 0.65 % NA SOLN
2.0000 | Freq: Every day | NASAL | Status: DC | PRN
  Administered 2014-10-08: 2 via NASAL
  Filled 2014-10-06: qty 44

## 2014-10-06 MED ORDER — SODIUM CHLORIDE 0.9 % IJ SOLN: 3.0000 mL | INTRAMUSCULAR | Status: DC | PRN

## 2014-10-06 MED ORDER — ENOXAPARIN SODIUM 40 MG/0.4ML ~~LOC~~ SOLN
40.0000 mg | SUBCUTANEOUS | Status: DC
  Administered 2014-10-06 – 2014-10-09 (×4): 40 mg via SUBCUTANEOUS
  Filled 2014-10-06 (×6): qty 0.4

## 2014-10-06 MED ORDER — ACETAMINOPHEN 325 MG PO TABS
650.0000 mg | ORAL_TABLET | ORAL | Status: DC | PRN
  Administered 2014-10-07: 650 mg via ORAL
  Filled 2014-10-06 (×2): qty 2

## 2014-10-06 MED ORDER — FUROSEMIDE 10 MG/ML IJ SOLN
40.0000 mg | Freq: Once | INTRAMUSCULAR | Status: AC
  Administered 2014-10-06: 40 mg via INTRAVENOUS
  Filled 2014-10-06: qty 4

## 2014-10-06 MED ORDER — SODIUM CHLORIDE 0.65 % NA SOLN: 2.0000 | Freq: Every day | NASAL | Status: DC | PRN

## 2014-10-06 MED ORDER — ONDANSETRON HCL 4 MG/2ML IJ SOLN
4.0000 mg | Freq: Four times a day (QID) | INTRAMUSCULAR | Status: DC | PRN
  Administered 2014-10-06 – 2014-10-09 (×4): 4 mg via INTRAVENOUS
  Filled 2014-10-06 (×5): qty 2

## 2014-10-06 MED ORDER — POTASSIUM CHLORIDE CRYS ER 20 MEQ PO TBCR
20.0000 meq | EXTENDED_RELEASE_TABLET | Freq: Every day | ORAL | Status: DC
  Administered 2014-10-06 – 2014-10-10 (×5): 20 meq via ORAL
  Filled 2014-10-06 (×6): qty 1

## 2014-10-06 MED ORDER — LORAZEPAM 2 MG/ML IJ SOLN
1.0000 mg | Freq: Once | INTRAMUSCULAR | Status: AC
  Administered 2014-10-06: 02:00:00 via INTRAVENOUS
  Filled 2014-10-06: qty 1

## 2014-10-06 MED ORDER — FUROSEMIDE 10 MG/ML IJ SOLN
80.0000 mg | Freq: Once | INTRAMUSCULAR | Status: AC
  Administered 2014-10-06: 80 mg via INTRAVENOUS
  Filled 2014-10-06: qty 8

## 2014-10-06 MED ORDER — POLYETHYLENE GLYCOL 3350 17 GM/SCOOP PO POWD: 1.0000 | Freq: Every day | ORAL | Status: DC | PRN

## 2014-10-06 MED ORDER — FAMOTIDINE 20 MG PO TABS
20.0000 mg | ORAL_TABLET | Freq: Every day | ORAL | Status: DC
  Administered 2014-10-06 – 2014-10-10 (×5): 20 mg via ORAL
  Filled 2014-10-06 (×5): qty 1

## 2014-10-06 MED ORDER — TRAMADOL HCL 50 MG PO TABS
100.0000 mg | ORAL_TABLET | Freq: Once | ORAL | Status: AC
  Administered 2014-10-06: 100 mg via ORAL
  Filled 2014-10-06: qty 2

## 2014-10-06 MED ORDER — LOSARTAN POTASSIUM 25 MG PO TABS
25.0000 mg | ORAL_TABLET | Freq: Every day | ORAL | Status: DC
  Administered 2014-10-06 – 2014-10-10 (×5): 25 mg via ORAL
  Filled 2014-10-06 (×5): qty 1

## 2014-10-06 MED ORDER — SODIUM CHLORIDE 0.9 % IJ SOLN
3.0000 mL | Freq: Two times a day (BID) | INTRAMUSCULAR | Status: DC
  Administered 2014-10-06 – 2014-10-10 (×9): 3 mL via INTRAVENOUS

## 2014-10-06 MED ORDER — SODIUM CHLORIDE 0.9 % IV SOLN: 250.0000 mL | INTRAVENOUS | Status: DC | PRN

## 2014-10-06 NOTE — Progress Notes (Addendum)
[address]       [place] [address]             [phone]     CARDIOTHORACIC SURGERY PROGRESS NOTE  Subjective: Patient reports feeling better than she did last night.  No chest pain.  + SOB and orthopnea, both of which are improved.  + cough  Objective: Vital signs in last 24 hours: Temp:  [97.4 F (36.3 C)-98.1 F (36.7 C)] 98.1 F (36.7 C) (06/10 0733) Pulse Rate:  [90-122] 90 (06/10 0733) Cardiac Rhythm:  [-] Normal sinus rhythm (06/10 0733) Resp:  [15-30] 20 (06/10 0733) BP: (103-186)/(61-90) 110/64 mmHg (06/10 0733) SpO2:  [78 %-100 %] 97 % (06/10 0733) FiO2 (%):  [35 %-60 %] 35 % (06/10 0425) Weight:  [48.852 kg (107 lb 11.2 oz)] 48.852 kg (107 lb 11.2 oz) (06/10 0723)  Physical Exam:  Rhythm:   sinus  Breath sounds: Bibasilar inspiratory crackles  Heart sounds:  RRR w/ III/VI systolic murmur best at apex  Incisions:  n/a  Abdomen:  soft  Extremities:  warm   Intake/Output from previous day: 06/09 0701 - 06/10 0700 In: -  Out: 1700 [Urine:1700] Intake/Output this shift:    Lab Results:  Recent Labs  10/06/14 0134 10/06/14 1057  WBC 13.4* 8.0  HGB 15.3* 13.8  HCT 46.7* 41.9  PLT 355 314   BMET:  Recent Labs  10/06/14 0134 10/06/14 1057  NA 134* 135  K 3.6 4.1  CL 103 100*  CO2 23 27  GLUCOSE 114* 118*  BUN 12 6  CREATININE 0.59 0.64  CALCIUM 8.9 8.7*    CBG (last 3)  No results for input(s): GLUCAP in the last 72 hours. PT/INR:  No results for input(s): LABPROT, INR in the last 72 hours.  CXR:  PORTABLE CHEST - 1 VIEW  COMPARISON: 07/29/2014  FINDINGS: There is diffuse interstitial fluid and moderate vascular congestion, consistent with congestive heart failure. There is no confluent airspace consolidation. There is no large effusion. The vascular and interstitial changes are significantly worsened from 07/29/2014.  IMPRESSION: Congestive heart failure   Electronically Signed  By: Andreas Newport M.D.  On: 10/06/2014 01:47  Assessment/Plan:  Patient appears to have an acute exacerbation of chronic diastolic CHF related to her underlying rheumatic heart disease.  She is well known to me, originally from consultation 07/28/2014 during her hospitalization earlier this spring.  She was just recently seen in the office on 10/02/2014 at which time we discussed possible elective mitral valve repair/replacement - please see that office note and my original consultation note for details.  The patient called my office earlier this week and has tentatively scheduled surgery for July 13th.  I have offered the patient the possibility of scheduling surgery somewhat sooner, providing that there are no signs to suggest that she has ongoing acute tracheobronchitis or pneumonia (she does have mild leukocytosis).  She does NOT wish to move her surgery date up any sooner for personal reasons.  I will plan to check on her Monday if she is still in the hospital.  If she is discharged over the weekend I will plan to see her back in the office on June 29th as previously scheduled.  I spent in excess of 15 minutes during the conduct of this hospital encounter and >50% of this time involved direct face-to-face encounter with the patient for counseling and/or coordination of their care.  Rexene Alberts 10/06/2014 1:48 PM

## 2014-10-06 NOTE — Consult Note (Signed)
   [facility] CM Inpatient Consult   10/06/2014  Elaine Le 03-13-1961 [id number]   Patient evaluated for [facility] services. Patient known to Probation officer as she declined [facility] in the past when she was at [facility]. Came to bedside to offer [facility] again due to admissions and diagnosis. However, patient had family coming in room at same time and asked that Probation officer come back later. Will follow up at later time. Will make inpatient RNCM aware.  Marthenia Rolling, MSN-Ed, RN,BSN [facility] Liaison [phone]

## 2014-10-06 NOTE — H&P (Signed)
CARDIOLOGY CONSULTATION NOTE.  NAME:  Elaine Le   MRN: [id number] DOB:  1960/10/13   ADMIT DATE: 10/06/2014  Reason for Admission: Acute on chronic diastolic heart failure related to mitral valve disease. This was associated with acute respiratory failure with hypoxIA  Primary Cardiologist: Dr. Minus Breeding Cardiac surgeon: Dr. Roxy Manns PCP: Doristine Locks, NP  Pulmonary: Dr. Lake Bells  HPI: This is a 54 y.o. 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 She also has mild centrilobular emphysema followed by Dr. Lake Bells. Prior to her admission in February she had no prior cardiac history. She was admitted to [facility] in February through early March 2016 with influenza a acute respiratory failure requiring ventilator support. During hospitalization she had an echocardiogram that showed possible vegetation on the mitral valve. She was seen in consultation when she was readmitted in late March 2016 with acute onset orthopnea or PND and fatigue. There is questionable right lower lobe pneumonia.  Based on her initial TTE, she had a TEE which showed essentially him rheumatic mitral valve disease with moderate stenosis and severe MR. (No known history of rheumatic fever. She had been told that her murmur intermittently but had not been evaluated before.)  She also had a cardiac catheterization back in March showing no significant CAD but significant MR with elevated pulmonary pressures. She was seen in consultation by Dr. Darylene Price from [facility] on April 1. It would appear that leading up to this the patient had been having progressively worsening dyspnea began after being discharged from her second hospitalization. He clearly has stage D severe symptomatically primary mitral regurgitation with moderate stenosis. It was thought that she would benefit from attempted mitral  valve repair versus replacement. The thought was that she should recover somewhat from her prolonged hospitalizations.  She socially saw Dr. Martinique post discharge, and was doing relatively well. Apparently she has done her dental evaluation. No medication changes were made at that time. She then saw Dr. Roxy Manns on June 6, and the thought was that she would still like to delay her surgery for several more weeks in order to get family nearby.  At that time she seemed to be doing relatively well.  She is otherwise doing well until last night. Actually yesterday during the day she contacted Dr. Guy Sandifer office and had scheduled surgery for July. Unfortunately yesterday evening she started to become somewhat agitated and became uncomfortable. More orthopneic than usual and more dyspneic. These are got progressively worse and by 1:30 2 in the morning she was profoundly dyspneic and went to [facility] emergency room. There she was found to be profoundly hypertensive and with pulmonary edema and was treated with IV Lasix. Transferred here to [facility] where she is sleeping upon my initial evaluation uncomfortable. Her recent post 2 L, after the IV Lasix and is now resting comfortable without baseline dyspnea. She was placed on BiPAP for short-term but was not stabilized prior to transfer to [facility].  Cardiovascular ROS: positive for - dyspnea on exertion, murmur, orthopnea, rapid heart rate, shortness of breath and Profound dyspnea with and now hypertension that was resolved after diuresis. negative for - chest pain, edema, irregular heartbeat, loss of consciousness or Syncope/near syncope, TIA/amaurosis fugax    Past Medical History  Diagnosis Date  . Chronic sinusitis   . Arthritis   . Fibromyalgia   . GERD (gastroesophageal reflux disease)   .  Irritable bowel syndrome (IBS)   . Headaches, cluster   . Back pain   . Fatigue   . Muscle pain   . Night sweats   . Continuous urine leakage   .  Diverticulosis   . Adenomatous colon polyp   . Internal hemorrhoids   . Hiatal hernia   . Pneumonia   . Mitral valve regurgitation     severe  . COPD (chronic obstructive pulmonary disease)-PFT pending  07/17/2014  . Community acquired pneumonia   . CN (constipation) 05/25/2014  . Acute respiratory failure with hypercapnia 06/21/2014  . Hx of adenomatous colonic polyps 03/10/2011    Oct 2012, repeat colon Oct 2017   . IBS (irritable bowel syndrome) 01/21/2011  . Influenza A 06/29/2014  . Muscular deconditioning 07/05/2014  . Pleural effusion   . Protein-calorie malnutrition, severe 07/06/2014  . Chronic pain syndrome   . Tobacco abuse   . Chronic diastolic heart failure     related to MR/MS  . 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/10/2014    Severe mitral regurgitation with moderate mitral stenosis  . Severe mitral regurgitation by prior echocardiogram 07/11/2014  . Mitral valve stenosis, moderate 07/11/2014  . Acute systolic heart failure 09/2701   Past Surgical History  Procedure Laterality Date  . Thoracic outlet surgery    . Tubal ligation    . Vulva surgery    . Neuroplasty / transposition median nerve at carpal tunnel bilateral    . Cystostomy w/ bladder dilation      at age 39  . Tee without cardioversion N/A 07/10/2014    Procedure: TRANSESOPHAGEAL ECHOCARDIOGRAM (TEE);  Surgeon: Lelon Perla, MD;  Location: [facility] ENDOSCOPY;  Service: Cardiovascular;  55-60%. No regional wall motion modalities. Moderate mitral stenosis with severe regurgitation and moderate to severely dilated left atrium.  . Left and right heart catheterization with coronary angiogram N/A 07/27/2014    Procedure: LEFT AND RIGHT HEART CATHETERIZATION WITH CORONARY ANGIOGRAM;  Surgeon: Burnell Blanks, MD;  Location: [facility] CATH LAB;  Right left heart catheterization: Mild/minimal coronary artery disease. RV: 50/7/17, PA: 48/29 (mean 38), PCWP 33 mmHg  . Transthoracic echocardiogram  07/04/2014    Normal LV Size/Fnx  - EF 60-65% no RWMA; MV: thickened leaflets with doming, fixed Posterior leaflet, anterior leaflet w/ large/shaggy & mobile density.  c/w Rheumatic Mitral disease - moderate MS & Mod-Severe MR.    FAMHx: Family History  Problem Relation Age of Onset  . Kidney cancer Mother   . Colon polyps Mother   . Irritable bowel syndrome Mother   . Diabetes Sister   . Liver disease Sister   . Irritable bowel syndrome Daughter   . Diabetes Brother   . Colon cancer Neg Hx     SOCHx: She is single and lives alone in [place], and the. Her father lives nearby as does her sister who helped her to the vertex room last night. She has been disabled for more than 20 years due to fibromyalgia and chronic fatigue/chronic pain. She is on long-term narcotics she is relatively sedentary lifestyle.  reports that she quit smoking about 3 months ago. Her smoking use included Cigarettes. She has a 17.5 pack-year smoking history. She has never used smokeless tobacco. She reports that she does not drink alcohol or use illicit drugs.  ALLERGIES: Allergies  Allergen Reactions  . Morphine And Related     Altered mental state  . Other     Any antidepressants per pt- causes AMS, anger  Review of Systems  Constitutional: Positive for malaise/fatigue.  HENT: Negative for nosebleeds.   Eyes: Negative.   Respiratory: Positive for shortness of breath. Negative for cough and sputum production.   Cardiovascular: Positive for palpitations (occasional flip-flops chest.).  Gastrointestinal: Negative for nausea, vomiting, abdominal pain, blood in stool and melena.  Genitourinary: Negative for hematuria.  Musculoskeletal:       Chronic pain  Endo/Heme/Allergies: Does not bruise/bleed easily.  Psychiatric/Behavioral: The patient is nervous/anxious (She get quite anxious last night).        Denies active depression  All other systems reviewed and are negative.  HOME MEDICATIONS: Prescriptions prior to admission    Medication Sig Dispense Refill Last Dose  . acetaminophen (TYLENOL) 325 MG tablet Take 2 tablets (650 mg total) by mouth every 4 (four) hours as needed for mild pain (temp > 101.5).   Past Month at Unknown time  . aspirin 81 MG tablet Take 81 mg by mouth daily. Takes 2 tabs daily   10/05/2014 at 0900  . furosemide (LASIX) 40 MG tablet Take 1 tablet (40 mg total) by mouth daily. 30 tablet 11 10/05/2014 at Unknown time  . ibuprofen (ADVIL,MOTRIN) 200 MG tablet Take 400 mg by mouth every 6 (six) hours as needed (pain).    10/05/2014 at Unknown time  . polyethylene glycol powder (GLYCOLAX) powder Take 255 g by mouth daily as needed. (Patient taking differently: Take 1 Container by mouth daily as needed for mild constipation. ) 255 g 12 10/05/2014 at Unknown time  . potassium chloride SA (K-DUR,KLOR-CON) 20 MEQ tablet Take 1 tablet (20 mEq total) by mouth daily. 30 tablet 11 10/05/2014 at Unknown time  . ranitidine (ZANTAC) 150 MG tablet TAKE 1 TABLET BY MOUTH EVERY DAY 60 tablet 10 10/05/2014 at Unknown time  . sodium chloride (OCEAN) 0.65 % nasal spray Place 2 sprays into the nose daily as needed for congestion (congestion).    10/05/2014 at Unknown time     VITALS: Blood pressure 110/64, pulse 90, temperature 98.1 F (36.7 C), temperature source Oral, resp. rate 20, height 5' (1.524 m), weight 48.852 kg (107 lb 11.2 oz), SpO2 97 %.  PHYSICAL EXAM: General appearance: alert, cooperative, appears stated age and Somewhat thin and frail. Nontoxic-appearing. Actually was resting very comfortably upon my initial evaluation. Neck: no adenopathy, no carotid bruit, supple, symmetrical, trachea midline, thyroid not enlarged, symmetric, no tenderness/mass/nodules and Mildly elevated JVP Lungs: Mild bibasal rales but otherwise mostly CTA B. Nonlabored. Heart: RRR with occasional ectopy. Normal S1 and S2. Blowing high-pitched HSM heard throughout precordium but worst at the apex. There also appears to be a soft diastolic  rumble on the left upper sternal border. Somewhat hyperdynamic precordium. Abdomen: soft, non-tender; bowel sounds normal; no masses,  no organomegaly Extremities: extremities normal, atraumatic, no cyanosis or edema Pulses: 2+ and symmetric Skin: Skin color, texture, turgor normal. No rashes or lesions Neurologic: Mental status: Alert, oriented, thought content appropriate Cranial nerves: normal  LABS: Results for orders placed or performed during the hospital encounter of 10/06/14 (from the past 24 hour(s))  Blood gas, arterial ([facility] & [facility] ONLY)     Status: Abnormal   Collection Time: 10/06/14  1:33 AM  Result Value Ref Range   FIO2 0.60 %   Delivery systems BILEVEL POSITIVE AIRWAY PRESSURE    Mode BILEVEL POSITIVE AIRWAY PRESSURE    Rate 14 resp/min   Inspiratory PAP 10    Expiratory PAP 5    pH, Arterial 7.320 (L) 7.350 -  7.450   pCO2 arterial 42.7 35.0 - 45.0 mmHg   pO2, Arterial 181 (H) 80.0 - 100.0 mmHg   Bicarbonate 21.6 20.0 - 24.0 mEq/L   TCO2 19.3 0 - 100 mmol/L   Acid-base deficit 4.2 (H) 0.0 - 2.0 mmol/L   O2 Saturation 98.3 %   Patient temperature 97.4    Collection site RIGHT RADIAL    Drawn by [phone]    Sample type ARTERIAL DRAW    Allens test (pass/fail) PASS PASS  CBC with Differential     Status: Abnormal   Collection Time: 10/06/14  1:34 AM  Result Value Ref Range   WBC 13.4 (H) 4.0 - 10.5 K/uL   RBC 5.18 (H) 3.87 - 5.11 MIL/uL   Hemoglobin 15.3 (H) 12.0 - 15.0 g/dL   HCT 46.7 (H) 36.0 - 46.0 %   MCV 90.2 78.0 - 100.0 fL   MCH 29.5 26.0 - 34.0 pg   MCHC 32.8 30.0 - 36.0 g/dL   RDW 13.0 11.5 - 15.5 %   Platelets 355 150 - 400 K/uL   Neutrophils Relative % 57 43 - 77 %   Neutro Abs 7.6 1.7 - 7.7 K/uL   Lymphocytes Relative 35 12 - 46 %   Lymphs Abs 4.7 (H) 0.7 - 4.0 K/uL   Monocytes Relative 6 3 - 12 %   Monocytes Absolute 0.8 0.1 - 1.0 K/uL   Eosinophils Relative 1 0 - 5 %   Eosinophils Absolute 0.2 0.0 - 0.7 K/uL   Basophils Relative 1 0 - 1 %    Basophils Absolute 0.1 0.0 - 0.1 K/uL  Comprehensive metabolic panel     Status: Abnormal   Collection Time: 10/06/14  1:34 AM  Result Value Ref Range   Sodium 134 (L) 135 - 145 mmol/L   Potassium 3.6 3.5 - 5.1 mmol/L   Chloride 103 101 - 111 mmol/L   CO2 23 22 - 32 mmol/L   Glucose, Bld 114 (H) 65 - 99 mg/dL   BUN 12 6 - 20 mg/dL   Creatinine, Ser 0.59 0.44 - 1.00 mg/dL   Calcium 8.9 8.9 - 10.3 mg/dL   Total Protein 7.8 6.5 - 8.1 g/dL   Albumin 4.5 3.5 - 5.0 g/dL   AST 32 15 - 41 U/L   ALT 26 14 - 54 U/L   Alkaline Phosphatase 104 38 - 126 U/L   Total Bilirubin 0.4 0.3 - 1.2 mg/dL   GFR calc non Af Amer >60 >60 mL/min   GFR calc Af Amer >60 >60 mL/min   Anion gap 8 5 - 15  D-dimer, quantitative (not at [facility])     Status: None   Collection Time: 10/06/14  1:34 AM  Result Value Ref Range   D-Dimer, Quant <0.27 0.00 - 0.48 ug/mL-FEU  Brain natriuretic peptide     Status: Abnormal   Collection Time: 10/06/14  1:35 AM  Result Value Ref Range   B Natriuretic Peptide 187.6 (H) 0.0 - 100.0 pg/mL  I-Stat Troponin, ED - 0, 3, 6 hours (not at [facility])     Status: None   Collection Time: 10/06/14  1:37 AM  Result Value Ref Range   Troponin i, poc 0.01 0.00 - 0.08 ng/mL   Comment 3            IMAGING: Dg Chest Portable 1 View  10/06/2014   CLINICAL DATA:  Dyspnea and hypoxia  EXAM: PORTABLE CHEST - 1 VIEW  COMPARISON:  07/29/2014  FINDINGS: There  is diffuse interstitial fluid and moderate vascular congestion, consistent with congestive heart failure. There is no confluent airspace consolidation. There is no large effusion. The vascular and interstitial changes are significantly worsened from 07/29/2014.  IMPRESSION: Congestive heart failure   Electronically Signed   By: Andreas Newport M.D.   On: 10/06/2014 01:47    IMPRESSION: Principal Problem:   Acute congestive heart failure secondary to severe MR Active Problems:   Severe mitral regurgitation   Chronic diastolic heart failure    Pulmonary edema   COPD (chronic obstructive pulmonary disease)-mild   CAD- mild, non obstructive at cath 07/27/14   Severe mitral regurgitation by prior echocardiogram    RECOMMENDATION: Recurrent admission for acute restaurant failure. I've contacted the CT surgical office and Dr. Roxy Manns will see the patient during his hospital stay. At this point I think we may need to consider pushing of the timeline for her surgery since she has now had yet again another exacerbation. Currently she feels quite well. I will give her 1 more dose of IV Lasix today and the police monitored throughout the day. I will add an ARB for some afterload reduction since her initial presentation was with hypertension.  Leery of beta blocker at this point in time due to her being just recovering from an acute exacerbation.   I will continue other home medications for her fibromyalgia. She does not have any baseline treatments for her COPD, but will provide when necessary stiffness. As needed angiolytic  Monitor potassium levels with IV Lasix.  No need for echocardiogram since she's had an aggressive full evaluation with echo and transesophageal echo in the last few months. She has severe MR but his had normal EF. If she is pending upcoming valve surgery, she will likely have a TEE done during the surgery.  Would anticipate that if she decides to still wait for surgery that she should at least a overnight tonight and potentially consider discharge tomorrow. Otherwise would probably keep her in the hospital over the weekend for potential Valve surgery next week.   Time Spent Directly with Patient: 50 minutes  HARDING, Leonie Green, M.D., M.S. Interventional Cardiologist   Pager # [phone]

## 2014-10-06 NOTE — Progress Notes (Signed)
RN called RT and stated that MD wanted patient weaned off BIPAP.Patient placed on 3 liter nasal canula.RT will continue to monitor.

## 2014-10-06 NOTE — ED Notes (Signed)
EDP Horton at bedside 

## 2014-10-06 NOTE — ED Notes (Signed)
[facility] notified of need for transport, per Andee Poles, it will be after "shift change" before the patient can be transported

## 2014-10-06 NOTE — Progress Notes (Signed)
RT placed patient on BIPAP. Setting is peep of 5, PC 10 above peep, 60%, and a rate of 14. Patient is tolerating well. RT will continue to monitor.

## 2014-10-06 NOTE — Progress Notes (Signed)
Physician notified: Card Master At: [phone]  Regarding: Pt transferred from [facility] ED, assigned to Azeem. Need admission orders/med orders.  Response: Will notify MD of patient admitted.

## 2014-10-06 NOTE — ED Provider Notes (Signed)
CSN: [id number]     Arrival date & time 10/06/14  0121 History   First MD Initiated Contact with Patient 10/06/14 0127     Chief Complaint  Patient presents with  . Respiratory Distress     (Consider location/radiation/quality/duration/timing/severity/associated sxs/prior Treatment) HPI  This is a 54 year old female with recent diagnosis of acute on chronic respiratory failure secondary to CHF and severe mitral regurgitation who presents with shortness of breath. Onset of shortness of breath approximately 11 PM. Patient states that it was very acute and she felt well earlier this evening. No cough or fever. Reports compliance with medications, including Lasix. Difficult to obtain full history secondary to respiratory distress  Level V caveat.  I reviewed the patient's chart, she has had 2 recent admissions over the last 6 months. She was initially administered for respiratory failure thought to be secondary to influenza A. At that time she had workup that revealed congestive heart failure secondary to mitral valve regurgitation. She had a second admission in March of this year for CHF and mitral regurg. She required BiPAP at that time and aggressive diuresis. She is seeing Dr. Roxy Manns, cardiothoracic surgery, and in discussions to plan valve replacement.  Was also recently seen by Dr. Martinique cardiology.  Past Medical History  Diagnosis Date  . Chronic sinusitis   . Arthritis   . Fibromyalgia   . GERD (gastroesophageal reflux disease)   . Irritable bowel syndrome (IBS)   . Headaches, cluster   . Back pain   . Fatigue   . Muscle pain   . Night sweats   . Continuous urine leakage   . Diverticulosis   . Adenomatous colon polyp   . Internal hemorrhoids   . Hiatal hernia   . Pneumonia   . Mitral valve regurgitation     severe  . COPD (chronic obstructive pulmonary disease)-PFT pending  07/17/2014  . Community acquired pneumonia   . CN (constipation) 05/25/2014  . Acute on chronic  respiratory failure, unspecified whether with hypoxia or hypercapnia   . Acute respiratory acidosis   . Acute respiratory failure with hypercapnia 06/21/2014  . Acute respiratory failure with hypoxia   . Hx of adenomatous colonic polyps 03/10/2011    Oct 2012, repeat colon Oct 2017   . IBS (irritable bowel syndrome) 01/21/2011  . Influenza A 06/29/2014  . Mitral regurgitation 07/11/2014  . Mitral valve stenosis, moderate 07/11/2014  . Muscular deconditioning 07/05/2014  . Pleural effusion   . Protein-calorie malnutrition, severe 07/06/2014  . Chronic pain syndrome   . Tobacco abuse   . Acute on chronic diastolic heart failure   . Chronic diastolic heart failure   . 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/10/2014    Severe mitral regurgitation with moderate mitral stenosis   Past Surgical History  Procedure Laterality Date  . Thoracic outlet surgery    . Tubal ligation    . Vulva surgery    . Neuroplasty / transposition median nerve at carpal tunnel bilateral    . Cystostomy w/ bladder dilation      at age 42  . Tee without cardioversion N/A 07/10/2014    Procedure: TRANSESOPHAGEAL ECHOCARDIOGRAM (TEE);  Surgeon: Lelon Perla, MD;  Location: [facility] ENDOSCOPY;  Service: Cardiovascular;  Laterality: N/A;  . Cardiac catheterization  07/27/2014  . Left and right heart catheterization with coronary angiogram N/A 07/27/2014    Procedure: LEFT AND RIGHT HEART CATHETERIZATION WITH CORONARY ANGIOGRAM;  Surgeon: Burnell Blanks, MD;  Location: [facility] CATH LAB;  Service: Cardiovascular;  Laterality: N/A;   Family History  Problem Relation Age of Onset  . Kidney cancer Mother   . Colon polyps Mother   . Irritable bowel syndrome Mother   . Diabetes Sister   . Liver disease Sister   . Irritable bowel syndrome Daughter   . Diabetes Brother   . Colon cancer Neg Hx    History  Substance Use Topics  . Smoking status: Former Smoker -- 0.50 packs/day for 35 years    Types: Cigarettes    Quit date:  06/17/2014  . Smokeless tobacco: Never Used  . Alcohol Use: No     Comment: rare   OB History    No data available     Review of Systems  Unable to perform ROS: Acuity of condition      Allergies  Morphine and related and Other  Home Medications   Prior to Admission medications   Medication Sig Start Date End Date Taking? Authorizing Provider  acetaminophen (TYLENOL) 325 MG tablet Take 2 tablets (650 mg total) by mouth every 4 (four) hours as needed for mild pain (temp > 101.5). 07/29/14  Yes Erlene Quan, PA-C  aspirin 81 MG tablet Take 81 mg by mouth daily. Takes 2 tabs daily   Yes Historical Provider, MD  furosemide (LASIX) 40 MG tablet Take 1 tablet (40 mg total) by mouth daily. 07/29/14  Yes Luke K Kilroy, PA-C  ibuprofen (ADVIL,MOTRIN) 200 MG tablet Take 400 mg by mouth every 6 (six) hours as needed (pain).    Yes Historical Provider, MD  polyethylene glycol powder (GLYCOLAX) powder Take 255 g by mouth daily as needed. Patient taking differently: Take 1 Container by mouth daily as needed for mild constipation.  07/11/14  Yes Grace Bushy Minor, NP  potassium chloride SA (K-DUR,KLOR-CON) 20 MEQ tablet Take 1 tablet (20 mEq total) by mouth daily. 07/29/14  Yes Luke K Kilroy, PA-C  ranitidine (ZANTAC) 150 MG tablet TAKE 1 TABLET BY MOUTH EVERY DAY 05/25/14  Yes Jerene Bears, MD  sodium chloride (OCEAN) 0.65 % nasal spray Place 2 sprays into the nose daily as needed for congestion (congestion).    Yes Historical Provider, MD   BP 105/73 mmHg  Pulse 114  Temp(Src) 97.4 F (36.3 C) (Axillary)  Resp 18  SpO2 100% Physical Exam  Constitutional: She is oriented to person, place, and time.  Anxious appearing, tachypnea, nonrebreather in place  HENT:  Head: Normocephalic and atraumatic.  Cardiovascular: Regular rhythm.   Murmur heard. Tachycardia  Pulmonary/Chest: She is in respiratory distress. She has no wheezes.  Coarse breath sounds bilaterally, tachypnea, increased work of  breathing  Abdominal: Soft. Bowel sounds are normal. There is no tenderness. There is no rebound.  Musculoskeletal: She exhibits no edema.  Neurological: She is alert and oriented to person, place, and time.  Skin: Skin is warm and dry.  Psychiatric: She has a normal mood and affect.  Nursing note and vitals reviewed.   ED Course  Procedures (including critical care time)  CRITICAL CARE Performed by: Merryl Hacker   Total critical care time: 50 min  Critical care time was exclusive of separately billable procedures and treating other patients.  Critical care was necessary to treat or prevent imminent or life-threatening deterioration.  Critical care was time spent personally by me on the following activities: development of treatment plan with patient and/or surrogate as well as nursing, discussions with consultants, evaluation of patient's response to treatment, examination of patient,  obtaining history from patient or surrogate, ordering and performing treatments and interventions, ordering and review of laboratory studies, ordering and review of radiographic studies, pulse oximetry and re-evaluation of patient's condition.  Labs Review Labs Reviewed  CBC WITH DIFFERENTIAL/PLATELET - Abnormal; Notable for the following:    WBC 13.4 (*)    RBC 5.18 (*)    Hemoglobin 15.3 (*)    HCT 46.7 (*)    Lymphs Abs 4.7 (*)    All other components within normal limits  COMPREHENSIVE METABOLIC PANEL - Abnormal; Notable for the following:    Sodium 134 (*)    Glucose, Bld 114 (*)    All other components within normal limits  BRAIN NATRIURETIC PEPTIDE - Abnormal; Notable for the following:    B Natriuretic Peptide 187.6 (*)    All other components within normal limits  BLOOD GAS, ARTERIAL - Abnormal; Notable for the following:    pH, Arterial 7.320 (*)    pO2, Arterial 181 (*)    Acid-base deficit 4.2 (*)    All other components within normal limits  D-DIMER, QUANTITATIVE (NOT AT  [facility])  Randolm Idol, ED    Imaging Review Dg Chest Portable 1 View  10/06/2014   CLINICAL DATA:  Dyspnea and hypoxia  EXAM: PORTABLE CHEST - 1 VIEW  COMPARISON:  07/29/2014  FINDINGS: There is diffuse interstitial fluid and moderate vascular congestion, consistent with congestive heart failure. There is no confluent airspace consolidation. There is no large effusion. The vascular and interstitial changes are significantly worsened from 07/29/2014.  IMPRESSION: Congestive heart failure   Electronically Signed   By: Andreas Newport M.D.   On: 10/06/2014 01:47     EKG Interpretation  Sinus tachycardia with a rate of 112, no evidence of acute ST elevation or ischemia      MDM   Final diagnoses:  Acute pulmonary edema  Severe mitral regurgitation    Patient presents with an acute respiratory distress. Similar presentation in the past. Hypoxic on initial evaluation and with increased work of breathing. Patient placed on BiPAP given work of breathing and hypoxia. Patient has severe mitral regurg. Other vital signs notable for an initial blood pressure 186/90. Patient given 80 IV Lasix. Lab work obtained. Chest x-ray notable for pulmonary vascular congestion and interstitial changes. Otherwise workup is largely reassuring. Suspect acute respiratory decompensation secondary to severe mitral regurgitation and pulmonary vascular congestion. Patient has already diuresed 1 L. She is comfortable on BiPAP. She was weaned to nasal cannula. Discussed admission with Dr. Laveda Abbe who has accepted the patient in transfer to [facility].      Merryl Hacker, MD 10/06/14 [phone]

## 2014-10-06 NOTE — ED Notes (Signed)
Patient is extremely dyspneic, oxygen saturation 78% on arrival.  She reports she has mitral valve issue and is due to have it replaced.  Placed on oxygen in room, now 100%

## 2014-10-06 NOTE — ED Notes (Signed)
RT at bedside placing patient on BiPap.

## 2014-10-07 LAB — BASIC METABOLIC PANEL
Anion gap: 7 (ref 5–15)
BUN: 9 mg/dL (ref 6–20)
CO2: 30 mmol/L (ref 22–32)
Calcium: 9.2 mg/dL (ref 8.9–10.3)
Chloride: 101 mmol/L (ref 101–111)
Creatinine, Ser: 0.69 mg/dL (ref 0.44–1.00)
GFR calc non Af Amer: >60 mL/min (ref >60)
GLUCOSE: 134 mg/dL — AB (ref 65–99)
Potassium: 3.9 mmol/L (ref 3.5–5.1)
Sodium: 138 mmol/L (ref 135–145)

## 2014-10-07 MED ORDER — METOCLOPRAMIDE HCL 5 MG/ML IJ SOLN
10.0000 mg | Freq: Three times a day (TID) | INTRAMUSCULAR | Status: DC | PRN
  Administered 2014-10-07 – 2014-10-09 (×3): 10 mg via INTRAVENOUS
  Filled 2014-10-07 (×5): qty 2

## 2014-10-07 MED ORDER — ACETAMINOPHEN 325 MG PO TABS
650.0000 mg | ORAL_TABLET | Freq: Four times a day (QID) | ORAL | Status: DC | PRN
  Administered 2014-10-08 – 2014-10-10 (×4): 650 mg via ORAL
  Filled 2014-10-07 (×4): qty 2

## 2014-10-07 MED ORDER — TRAMADOL HCL 50 MG PO TABS
100.0000 mg | ORAL_TABLET | Freq: Once | ORAL | Status: AC
  Administered 2014-10-07: 100 mg via ORAL
  Filled 2014-10-07: qty 2

## 2014-10-07 MED ORDER — FUROSEMIDE 20 MG PO TABS
20.0000 mg | ORAL_TABLET | Freq: Every day | ORAL | Status: DC
  Administered 2014-10-07 – 2014-10-10 (×4): 20 mg via ORAL
  Filled 2014-10-07 (×4): qty 1

## 2014-10-07 MED ORDER — TRAMADOL HCL 50 MG PO TABS
50.0000 mg | ORAL_TABLET | Freq: Three times a day (TID) | ORAL | Status: DC | PRN
  Administered 2014-10-08 – 2014-10-09 (×4): 50 mg via ORAL
  Filled 2014-10-07 (×5): qty 1

## 2014-10-07 NOTE — Progress Notes (Signed)
Subjective:  Does not feel well; c/o headache, ? Sinus congestion; vomited this am.  Objective:   Vital Signs : Filed Vitals:   10/06/14 0733 10/06/14 1423 10/06/14 2135 10/07/14 0557  BP: 110/64 96/51 95/52  101/57  Pulse: 90 67 66 80  Temp: 98.1 F (36.7 C) 98.9 F (37.2 C) 98.5 F (36.9 C) 99.4 F (37.4 C)  TempSrc: Oral Oral Oral Oral  Resp: 20 20 18 16   Height: 5' (1.524 m)     Weight:    48.399 kg (106 lb 11.2 oz)  SpO2: 97% 100% 100% 100%    Intake/Output from previous day:  Intake/Output Summary (Last 24 hours) at 10/07/14 1118 Last data filed at 10/07/14 0919  Gross per 24 hour  Intake    780 ml  Output   1000 ml  Net   -220 ml    I/O since admission: -1920    Wt Readings from Last 3 Encounters:  10/07/14 48.399 kg (106 lb 11.2 oz)  10/02/14 47.628 kg (105 lb)  08/23/14 47.628 kg (105 lb)    Medications: . enoxaparin (LOVENOX) injection  40 mg Subcutaneous Q24H  . famotidine  20 mg Oral Daily  . losartan  25 mg Oral Daily  . potassium chloride SA  20 mEq Oral Daily  . sodium chloride  3 mL Intravenous Q12H       Physical Exam:   General appearance: alert, cooperative and does not feel well Neck: no adenopathy, no carotid bruit, no JVD, supple, symmetrical, trachea midline and thyroid not enlarged, symmetric, no tenderness/mass/nodules Lungs: no wheezing Heart: regular rate and rhythm and 3/6 MR murmur at apex Abdomen: soft, non-tender; bowel sounds normal; no masses,  no organomegaly Extremities: no edema, redness or tenderness in the calves or thighs Pulses: 2+ and symmetric Skin: Skin color, texture, turgor normal. No rashes or lesions Neurologic: Grossly normal   Rate: 85  Rhythm: normal sinus rhythm  ECG (independently read by me):  Lab Results:   Recent Labs  10/06/14 0134 10/06/14 1057 10/07/14 0310  NA 134* 135 138  K 3.6 4.1 3.9  CL 103 100* 101  CO2 23 27 30   GLUCOSE 114* 118* 134*  BUN 12 6 9   CREATININE 0.59 0.64  0.69  CALCIUM 8.9 8.7* 9.2     Recent Labs  10/06/14 0134 10/06/14 1057  WBC 13.4* 8.0  NEUTROABS 7.6  --   HGB 15.3* 13.8  HCT 46.7* 41.9  MCV 90.2 89.3  PLT 355 314    No results for input(s): TROPONINI in the last 72 hours.  Invalid input(s): CK, MB  Lab Results  Component Value Date   TSH 3.75 01/21/2011     Recent Labs  10/06/14 0134  PROT 7.8  ALBUMIN 4.5  AST 32  ALT 26  ALKPHOS 104  BILITOT 0.4   No results for input(s): INR in the last 72 hours. BNP (last 3 results)  Recent Labs  07/25/14 0813 10/06/14 0135 10/06/14 1057  BNP 372.0* 187.6* 309.4*    ProBNP (last 3 results) No results for input(s): PROBNP in the last 8760 hours.   Lipid Panel     Component Value Date/Time   TRIG 115 06/28/2014 1400       No results for input(s): HGBA1C in the last 72 hours.   Imaging:  Dg Chest Portable 1 View  10/06/2014   CLINICAL DATA:  Dyspnea and hypoxia  EXAM: PORTABLE CHEST - 1 VIEW  COMPARISON:  07/29/2014  FINDINGS: There  is diffuse interstitial fluid and moderate vascular congestion, consistent with congestive heart failure. There is no confluent airspace consolidation. There is no large effusion. The vascular and interstitial changes are significantly worsened from 07/29/2014.  IMPRESSION: Congestive heart failure   Electronically Signed   By: Andreas Newport M.D.   On: 10/06/2014 01:47      Assessment/Plan:   Principal Problem:   Acute congestive heart failure secondary to severe MR Active Problems:   Severe mitral regurgitation   COPD (chronic obstructive pulmonary disease)-mild   Acute on chronic respiratory failure with hypoxia   CAD- mild, non obstructive at cath 07/27/14   Chronic diastolic heart failure   Pulmonary edema   Severe mitral regurgitation by prior echocardiogram  1. CHF; improved BNP yesterday 309; -1920 net u/o; continue with oral lasix 20 mg today 2. Severe MR; does not want to move surgical date up. 3.  Nausea/vomiting  4. Low grade T at 99.4;  F/u cbc with diff  ? sinusitis  Will keep today  Troy Sine, MD, [facility] 10/07/2014, 11:18 AM

## 2014-10-07 NOTE — Progress Notes (Addendum)
Patient stated had not voided all day bladder scan performed showed 700 ml . Patient asked to try and void in [facility] . Patient voided 860ml urine. MD paged .

## 2014-10-07 NOTE — Procedures (Signed)
Pt complains that she has been vomiting.  RT will not placed pt on BIPAP due to this and per pt request. RT will continue to monitor pt. Pt is resting comfortably on 3L [place].

## 2014-10-08 ENCOUNTER — Inpatient Hospital Stay (HOSPITAL_COMMUNITY): Payer: Medicare Other

## 2014-10-08 DIAGNOSIS — J0100 Acute maxillary sinusitis, unspecified: Secondary | ICD-10-CM

## 2014-10-08 LAB — BASIC METABOLIC PANEL
ANION GAP: 10 (ref 5–15)
BUN: 7 mg/dL (ref 6–20)
CALCIUM: 9.1 mg/dL (ref 8.9–10.3)
CO2: 30 mmol/L (ref 22–32)
CREATININE: 0.58 mg/dL (ref 0.44–1.00)
Chloride: 94 mmol/L — ABNORMAL LOW (ref 101–111)
GFR calc Af Amer: >60 mL/min (ref >60)
Glucose, Bld: 121 mg/dL — ABNORMAL HIGH (ref 65–99)
POTASSIUM: 3.9 mmol/L (ref 3.5–5.1)
SODIUM: 134 mmol/L — AB (ref 135–145)

## 2014-10-08 LAB — CBC WITH DIFFERENTIAL/PLATELET
BASOS PCT: 0 % (ref 0–1)
Basophils Absolute: 0 10*3/uL (ref 0.0–0.1)
Eosinophils Absolute: 0 10*3/uL (ref 0.0–0.7)
Eosinophils Relative: 0 % (ref 0–5)
HCT: 44.2 % (ref 36.0–46.0)
HEMOGLOBIN: 14.6 g/dL (ref 12.0–15.0)
Lymphocytes Relative: 16 % (ref 12–46)
Lymphs Abs: 1.9 10*3/uL (ref 0.7–4.0)
MCH: 30 pg (ref 26.0–34.0)
MCHC: 33 g/dL (ref 30.0–36.0)
MCV: 90.9 fL (ref 78.0–100.0)
MONO ABS: 0.8 10*3/uL (ref 0.1–1.0)
Monocytes Relative: 7 % (ref 3–12)
Neutro Abs: 9.1 10*3/uL — ABNORMAL HIGH (ref 1.7–7.7)
Neutrophils Relative %: 77 % (ref 43–77)
Platelets: 313 10*3/uL (ref 150–400)
RBC: 4.86 MIL/uL (ref 3.87–5.11)
RDW: 13.2 % (ref 11.5–15.5)
WBC: 11.8 10*3/uL — ABNORMAL HIGH (ref 4.0–10.5)

## 2014-10-08 LAB — BRAIN NATRIURETIC PEPTIDE: B Natriuretic Peptide: 171.8 pg/mL — ABNORMAL HIGH (ref 0.0–100.0)

## 2014-10-08 MED ORDER — AMOXICILLIN-POT CLAVULANATE 875-125 MG PO TABS
1.0000 | ORAL_TABLET | Freq: Two times a day (BID) | ORAL | Status: DC
  Filled 2014-10-08 (×2): qty 1

## 2014-10-08 MED ORDER — LEVOFLOXACIN 750 MG PO TABS
750.0000 mg | ORAL_TABLET | Freq: Every day | ORAL | Status: DC
  Administered 2014-10-08 – 2014-10-10 (×3): 750 mg via ORAL
  Filled 2014-10-08 (×3): qty 1

## 2014-10-08 NOTE — Progress Notes (Signed)
Subjective:  C/O sinus tenderness  Objective:   Vital Signs : Filed Vitals:   10/07/14 1447 10/07/14 2047 10/08/14 0118 10/08/14 0614  BP: 105/55 103/49 108/57 118/54  Pulse: 78 75 83 68  Temp: 98.6 F (37 C) 98.5 F (36.9 C) 98.6 F (37 C) 98.4 F (36.9 C)  TempSrc: Oral Oral Oral Oral  Resp: 20 18 18 18   Height:      Weight:    47.854 kg (105 lb 8 oz)  SpO2: 100% 100% 100% 100%    Intake/Output from previous day:  Intake/Output Summary (Last 24 hours) at 10/08/14 1043 Last data filed at 10/08/14 1000  Gross per 24 hour  Intake    660 ml  Output   1550 ml  Net   -890 ml    I/O since admission: -2810   Wt Readings from Last 3 Encounters:  10/08/14 47.854 kg (105 lb 8 oz)  10/02/14 47.628 kg (105 lb)  08/23/14 47.628 kg (105 lb)    Medications: . enoxaparin (LOVENOX) injection  40 mg Subcutaneous Q24H  . famotidine  20 mg Oral Daily  . furosemide  20 mg Oral Daily  . losartan  25 mg Oral Daily  . potassium chloride SA  20 mEq Oral Daily  . sodium chloride  3 mL Intravenous Q12H       Physical Exam:   General appearance: alert, cooperative and does not feel well Neck: no adenopathy, no carotid bruit, no JVD, supple, symmetrical, trachea midline and thyroid not enlarged, symmetric, no tenderness/mass/nodules Tenderness over maxillary sinuses to palitation Lungs: no wheezing Heart: regular rate and rhythm and 3/6 MR murmur at apex Abdomen: soft, non-tender; bowel sounds normal; no masses,  no organomegaly Extremities: no edema, redness or tenderness in the calves or thighs Pulses: 2+ and symmetric Skin: Skin color, texture, turgor normal. No rashes or lesions Neurologic: Grossly normal   Rate: 85  Rhythm: normal sinus rhythm  ECG (independently read by me): none since 6/10; reviewed with ST  Lab Results:   Recent Labs  10/06/14 1057 10/07/14 0310 10/08/14 0453  NA 135 138 134*  K 4.1 3.9 3.9  CL 100* 101 94*  CO2 27 30 30   GLUCOSE 118*  134* 121*  BUN 6 9 7   CREATININE 0.64 0.69 0.58  CALCIUM 8.7* 9.2 9.1     Recent Labs  10/06/14 0134 10/06/14 1057 10/08/14 0453  WBC 13.4* 8.0 11.8*  NEUTROABS 7.6  --  9.1*  HGB 15.3* 13.8 14.6  HCT 46.7* 41.9 44.2  MCV 90.2 89.3 90.9  PLT 355 314 313    No results for input(s): TROPONINI in the last 72 hours.  Invalid input(s): CK, MB  Lab Results  Component Value Date   TSH 3.75 01/21/2011     Recent Labs  10/06/14 0134  PROT 7.8  ALBUMIN 4.5  AST 32  ALT 26  ALKPHOS 104  BILITOT 0.4   No results for input(s): INR in the last 72 hours. BNP (last 3 results)  Recent Labs  10/06/14 0135 10/06/14 1057 10/08/14 0453  BNP 187.6* 309.4* 171.8*    ProBNP (last 3 results) No results for input(s): PROBNP in the last 8760 hours.   Lipid Panel     Component Value Date/Time   TRIG 115 06/28/2014 1400       No results for input(s): HGBA1C in the last 72 hours.   Imaging:  No results found.    Assessment/Plan:   Principal Problem:  Acute congestive heart failure secondary to severe MR Active Problems:   Severe mitral regurgitation   COPD (chronic obstructive pulmonary disease)-mild   Acute on chronic respiratory failure with hypoxia   CAD- mild, non obstructive at cath 07/27/14   Chronic diastolic heart failure   Pulmonary edema   Severe mitral regurgitation by prior echocardiogram  1. CHF; improved BNP today to 171; -2810 net u/o; continue with oral lasix 20 mg today 2. Severe MR; does not want to move surgical date up. 3. Nausea/vomiting improved 4. Sinus tenderness with h/o of sinusitis in past; s/p surgery.  Will check sinus CT scan and add empiric augmentin  Will keep today  Troy Sine, MD, [facility] 10/08/2014, 10:43 AM

## 2014-10-09 LAB — BASIC METABOLIC PANEL
ANION GAP: 9 (ref 5–15)
BUN: 8 mg/dL (ref 6–20)
CALCIUM: 9.3 mg/dL (ref 8.9–10.3)
CO2: 31 mmol/L (ref 22–32)
Chloride: 97 mmol/L — ABNORMAL LOW (ref 101–111)
Creatinine, Ser: 0.63 mg/dL (ref 0.44–1.00)
Glucose, Bld: 110 mg/dL — ABNORMAL HIGH (ref 65–99)
Potassium: 4.3 mmol/L (ref 3.5–5.1)
SODIUM: 137 mmol/L (ref 135–145)

## 2014-10-09 LAB — URINALYSIS, ROUTINE W REFLEX MICROSCOPIC
Bilirubin Urine: NEGATIVE
Glucose, UA: NEGATIVE mg/dL
HGB URINE DIPSTICK: NEGATIVE
Ketones, ur: NEGATIVE mg/dL
LEUKOCYTES UA: NEGATIVE
Nitrite: NEGATIVE
PROTEIN: NEGATIVE mg/dL
Specific Gravity, Urine: 1.007 (ref 1.005–1.030)
UROBILINOGEN UA: 0.2 mg/dL (ref 0.0–1.0)
pH: 7 (ref 5.0–8.0)

## 2014-10-09 MED ORDER — CLOTRIMAZOLE 1 % VA CREA
1.0000 | TOPICAL_CREAM | Freq: Every day | VAGINAL | Status: DC
  Administered 2014-10-09: 1 via VAGINAL
  Filled 2014-10-09: qty 45

## 2014-10-09 NOTE — Progress Notes (Signed)
Patient Name: Elaine Le Date of Encounter: 10/09/2014  Principal Problem:   Acute congestive heart failure secondary to severe MR Active Problems:   Severe mitral regurgitation   COPD (chronic obstructive pulmonary disease)-mild   Acute on chronic respiratory failure with hypoxia   CAD- mild, non obstructive at cath 07/27/14   Chronic diastolic heart failure   Pulmonary edema   Severe mitral regurgitation by prior echocardiogram  SUBJECTIVE  Denies cp, sob or palpitation. Complaining of sinus pain and nausea.   CURRENT MEDS . enoxaparin (LOVENOX) injection  40 mg Subcutaneous Q24H  . famotidine  20 mg Oral Daily  . furosemide  20 mg Oral Daily  . levofloxacin  750 mg Oral Daily  . losartan  25 mg Oral Daily  . potassium chloride SA  20 mEq Oral Daily  . sodium chloride  3 mL Intravenous Q12H    OBJECTIVE  Filed Vitals:   10/08/14 0614 10/08/14 1425 10/08/14 2223 10/09/14 0630  BP: 118/54 92/44 104/57 106/53  Pulse: 68 89 77 80  Temp: 98.4 F (36.9 C) 98.9 F (37.2 C) 98.7 F (37.1 C) 99 F (37.2 C)  TempSrc: Oral Oral Oral Oral  Resp: 18 20 18 18   Height:      Weight: 105 lb 8 oz (47.854 kg)   106 lb 3.2 oz (48.172 kg)  SpO2: 100% 100% 99% 98%    Intake/Output Summary (Last 24 hours) at 10/09/14 1302 Last data filed at 10/09/14 0851  Gross per 24 hour  Intake   1320 ml  Output   1300 ml  Net     20 ml   Filed Weights   10/07/14 0557 10/08/14 0614 10/09/14 0630  Weight: 106 lb 11.2 oz (48.399 kg) 105 lb 8 oz (47.854 kg) 106 lb 3.2 oz (48.172 kg)    PHYSICAL EXAM  General: Pleasant, anxious woman in  NAD. Neuro: Alert and oriented X 3. Moves all extremities spontaneously. Psych: Normal affect. HEENT:  R maxillary sinus tenderness  Neck: Supple without bruits or JVD. Lungs:  Resp regular and unlabored, CTA. Heart: RRR no s3, s4. Systolic murmur at apex.  Abdomen: Soft, non-tender, non-distended, BS + x 4.  Extremities: No clubbing, cyanosis  or edema. DP/PT/Radials 2+ and equal bilaterally.  Accessory Clinical Findings  CBC  Recent Labs  10/08/14 0453  WBC 11.8*  NEUTROABS 9.1*  HGB 14.6  HCT 44.2  MCV 90.9  PLT 509   Basic Metabolic Panel  Recent Labs  10/08/14 0453 10/09/14 0440  NA 134* 137  K 3.9 4.3  CL 94* 97*  CO2 30 31  GLUCOSE 121* 110*  BUN 7 8  CREATININE 0.58 0.63  CALCIUM 9.1 9.3   TELE  NSR  Radiology/Studies  Dg Chest Portable 1 View  10/06/2014   CLINICAL DATA:  Dyspnea and hypoxia  EXAM: PORTABLE CHEST - 1 VIEW  COMPARISON:  07/29/2014  FINDINGS: There is diffuse interstitial fluid and moderate vascular congestion, consistent with congestive heart failure. There is no confluent airspace consolidation. There is no large effusion. The vascular and interstitial changes are significantly worsened from 07/29/2014.  IMPRESSION: Congestive heart failure   Electronically Signed   By: Andreas Newport M.D.   On: 10/06/2014 01:47   Ct Maxillofacial Wo Cm  10/08/2014   CLINICAL DATA:  Bilateral maxillary pressure and pain. Frontal pain. History of pneumonia. Oxygen desaturation. Maxillary sinusitis.  EXAM: CT MAXILLOFACIAL WITHOUT CONTRAST  TECHNIQUE: Multidetector CT imaging of the maxillofacial structures was performed.  Multiplanar CT image reconstructions were also generated. A small metallic BB was placed on the right temple in order to reliably differentiate right from left.  COMPARISON:  01/31/2011  FINDINGS: Soft tissues: Limited intracranial imaging is grossly unremarkable. Normal it orbits and globes.  Bones: Clear mastoid air cells. Minimal mucosal thickening of right greater than left ethmoid air cells. Minimal maxillary sinus mucosal thickening with a right maxillary sinus mucous retention cyst or polyp of 11 mm. No fluid levels in the paranasal sinuses. No osseous destruction. No middle ear fluid.  IMPRESSION: Minimal mucosal thickening within the paranasal sinuses with a right maxillary sinus  mucous retention cyst or polyp. No sinus air-fluid levels.   Electronically Signed   By: Abigail Miyamoto M.D.   On: 10/08/2014 12:32    ASSESSMENT AND PLAN   1. CHF 2nd to severe MR - Net I/O negative 3.1 L since admission and negative 740 ml in last 24 hours. Appears euvolemic.  - Continue with oral lasix 20 mg. At home lasix 40mg  daily. MD to decide home dose.   2. Severe MR - Schedule surgery for July 13th. She does not want to move surgical date up. - Keep appoitment with Dr. Roxy Manns 10/25/14.  3. Right sinus tenderness   - h/o of sinusitis in past; s/p surgery about 25 years ago.  - CR of sinus showed Minimal mucosal thickening within the paranasal sinuses with a right maxillary sinus mucous retention cyst or polyp. No sinus air-fluid levels.  - Currently on levaquine.   4.  Nausea/vomiting  - appetite gradually improving. At times symptomatic. Continue PRN Reglan.    Dispo: At cardiac stand point of view she is doing well.  Euvolemic. I think she need to be seen by PCP for n/v. She does not want to resume care with her PCP Dr. Dorthy Cooler at [place]. Will get care management consult. She seen by ENT many years ago. May need to be seen by ENT as outpatient. ?discharge home and f/u with PCP or get IM/ENT consult. MD to decide further management.    Jarrett Soho PA-C Pager [phone]  Patient seen and examined. Agree with assessment and plan. Still with tendernes over r maxillary sinus but improved. CT reviewed with patient. Recommend ENT f/u eval.  No over CHF now. I/O -3490 since admission.  Plan dc in am tomorrow   Troy Sine, MD, [facility] 10/09/2014 3:08 PM

## 2014-10-09 NOTE — Progress Notes (Signed)
CM CONSULT Talked to patient about requesting a new PCP; Patient goes to [facility] and wants to change to another physician either in [place] or [facility]. Patient agreed to continue to see her PCP and will make an apt from home when she decides who she wants to see so it will not be a lapse in her care; Aneta Mins [phone]

## 2014-10-10 MED ORDER — CLOTRIMAZOLE 1 % VA CREA: 1.0000 | TOPICAL_CREAM | Freq: Every day | VAGINAL | Status: DC

## 2014-10-10 MED ORDER — LOSARTAN POTASSIUM 25 MG PO TABS: 25.0000 mg | ORAL_TABLET | Freq: Every day | ORAL | Status: DC

## 2014-10-10 MED ORDER — FUROSEMIDE 10 MG/ML IJ SOLN
40.0000 mg | Freq: Once | INTRAMUSCULAR | Status: AC
  Administered 2014-10-10: 40 mg via INTRAVENOUS

## 2014-10-10 MED ORDER — LEVOFLOXACIN 750 MG PO TABS: 750.0000 mg | ORAL_TABLET | Freq: Every day | ORAL | Status: DC

## 2014-10-10 MED ORDER — POTASSIUM CHLORIDE CRYS ER 20 MEQ PO TBCR: 20.0000 meq | EXTENDED_RELEASE_TABLET | Freq: Every day | ORAL | Status: DC

## 2014-10-10 MED ORDER — FUROSEMIDE 40 MG PO TABS
40.0000 mg | ORAL_TABLET | Freq: Every day | ORAL | Status: DC
Start: 2014-10-10 — End: 2014-11-15

## 2014-10-10 NOTE — Progress Notes (Signed)
Pt has orders to be discharged. Discharge instructions given and pt has no additional questions at this time. Medication regimen reviewed and pt educated. Pt verbalized understanding and has no additional questions. Telemetry box removed. IV removed and site in good condition. Pt stable and waiting for transportation.   Ernie Sagrero RN 

## 2014-10-10 NOTE — Discharge Summary (Signed)
CARDIOLOGY DISCHARGE SUMMARY   Patient ID: Elaine Le MRN: [id number] DOB/AGE: [birth year] 54 y.o.  Admit date: 10/06/2014 Discharge date: 10/10/2014  PCP: Doristine Locks, NP Primary Cardiologist: Dr Percival Spanish   Primary Discharge Diagnosis:   Acute congestive heart failure secondary to severe MR - weight 104 lbs at d/c Secondary Discharge Diagnosis:    Severe mitral regurgitation   COPD (chronic obstructive pulmonary disease)-mild   Acute on chronic respiratory failure with hypoxia   CAD- mild, non obstructive at cath 07/27/14   Chronic diastolic heart failure   Pulmonary edema   Severe mitral regurgitation by prior echocardiogram  Procedures: Maxillofacial CT  Hospital Course: Elaine Le is a 54 y.o. female with a history of severe MR, mod MS, emphysema. She had resp failure (ETT) in 05/2014. 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 Cath w/ no sig CAD. MV surgery planned in a few weeks.  She developed increasing SOB, orthopnea and PND. She came to the ER and was in CHF. She was admitted for further evaluation and treatment.   She was diuresed with IV Lasix and her respiratory status improved. Her BNP was rechecked and had improved with diuresis. She was started on a low dose of losartan for afterload reduction as she was hypertensive on admission. Discharge weight is 104 pounds, which she feels is her dry weight. She will track her weight at home and contact us if it changes significantly or if her shortness of breath increases.  She developed facial pain and tenderness. A CT was performed, results below. She is felt to have a sinus infection and will complete a course of Levaquin.   On 6/14, she was seen by Dr. Claiborne Billings and all data were reviewed. Her white count was only minimally elevated and she was afebrile. Her BUN, creatinine and electrolytes were tolerating the diuresis. She had a minimal elevation in her blood glucose and this can be followed as an outpatient. Her  upper respiratory symptoms have improved.  No further inpatient workup was indicated and she is considered stable for discharge, to follow up as an outpatient. She will have when necessary Lasix and Elaine Le dear to take for weight gain. She understands the need to watch sodium and monitor her weight carefully. Hopefully she will remain stable until her surgery.   Labs:   Lab Results  Component Value Date   WBC 11.8* 10/08/2014   HGB 14.6 10/08/2014   HCT 44.2 10/08/2014   MCV 90.9 10/08/2014   PLT 313 10/08/2014    Recent Labs Lab 10/06/14 0134  10/09/14 0440  NA 134*  < > 137  K 3.6  < > 4.3  CL 103  < > 97*  CO2 23  < > 31  BUN 12  < > 8  CREATININE 0.59  < > 0.63  CALCIUM 8.9  < > 9.3  PROT 7.8  --   --   BILITOT 0.4  --   --   ALKPHOS 104  --   --   ALT 26  --   --   AST 32  --   --   GLUCOSE 114*  < > 110*  < > = values in this interval not displayed.   B NATRIURETIC PEPTIDE  Date/Time Value Ref Range Status  10/08/2014 04:53 AM 171.8* 0.0 - 100.0 pg/mL Final  10/06/2014 10:57 AM 309.4* 0.0 - 100.0 pg/mL Final     Radiology: Dg Chest Portable 1 View 10/06/2014   CLINICAL DATA:  Dyspnea and hypoxia  EXAM: PORTABLE CHEST - 1 VIEW  COMPARISON:  07/29/2014  FINDINGS: There is diffuse interstitial fluid and moderate vascular congestion, consistent with congestive heart failure. There is no confluent airspace consolidation. There is no large effusion. The vascular and interstitial changes are significantly worsened from 07/29/2014.  IMPRESSION: Congestive heart failure   Electronically Signed   By: Andreas Newport M.D.   On: 10/06/2014 01:47   Ct Maxillofacial Wo Cm 10/08/2014   CLINICAL DATA:  Bilateral maxillary pressure and pain. Frontal pain. History of pneumonia. Oxygen desaturation. Maxillary sinusitis.  EXAM: CT MAXILLOFACIAL WITHOUT CONTRAST  TECHNIQUE: Multidetector CT imaging of the maxillofacial structures was performed. Multiplanar CT image reconstructions were also  generated. A small metallic BB was placed on the right temple in order to reliably differentiate right from left.  COMPARISON:  01/31/2011  FINDINGS: Soft tissues: Limited intracranial imaging is grossly unremarkable. Normal it orbits and globes.  Bones: Clear mastoid air cells. Minimal mucosal thickening of right greater than left ethmoid air cells. Minimal maxillary sinus mucosal thickening with a right maxillary sinus mucous retention cyst or polyp of 11 mm. No fluid levels in the paranasal sinuses. No osseous destruction. No middle ear fluid.  IMPRESSION: Minimal mucosal thickening within the paranasal sinuses with a right maxillary sinus mucous retention cyst or polyp. No sinus air-fluid levels.   Electronically Signed   By: Abigail Miyamoto M.D.   On: 10/08/2014 12:32    EKG: 10/06/2014 Sinus tachycardia, possible left atrial enlargement No acute ischemic changes  FOLLOW UP PLANS AND APPOINTMENTS Allergies  Allergen Reactions  . Morphine And Related     Altered mental state  . Other     Any antidepressants per pt- causes AMS, anger     Medication List    TAKE these medications        acetaminophen 325 MG tablet  Commonly known as:  TYLENOL  Take 2 tablets (650 mg total) by mouth every 4 (four) hours as needed for mild pain (temp > 101.5).     aspirin 81 MG tablet  Take 81 mg by mouth daily. Takes 2 tabs daily     clotrimazole 1 % vaginal cream  Commonly known as:  GYNE-LOTRIMIN  Place 1 Applicatorful vaginally at bedtime.     furosemide 40 MG tablet  Commonly known as:  LASIX  Take 1 tablet (40 mg total) by mouth daily. Take an extra tablet daily as needed for weight gain > 3 lbs in a day or 5 lbs in a week.     ibuprofen 200 MG tablet  Commonly known as:  ADVIL,MOTRIN  Take 400 mg by mouth every 6 (six) hours as needed (pain).     levofloxacin 750 MG tablet  Commonly known as:  LEVAQUIN  Take 1 tablet (750 mg total) by mouth daily.     polyethylene glycol powder powder    Commonly known as:  GLYCOLAX  Take 255 g by mouth daily as needed.     potassium chloride SA 20 MEQ tablet  Commonly known as:  K-DUR,KLOR-CON  Take 1 tablet (20 mEq total) by mouth daily. Take an additional potassium tablet every time he take an extra Lasix.     ranitidine 150 MG tablet  Commonly known as:  ZANTAC  TAKE 1 TABLET BY MOUTH EVERY DAY     sodium chloride 0.65 % nasal spray  Commonly known as:  OCEAN  Place 2 sprays into the nose daily as needed for congestion (  congestion).         Follow-up Information    Follow up with Rexene Alberts, MD.   Specialty:  Cardiothoracic Surgery   Why:  Keep appointment   Contact information:   [address] [place] [address] [phone]       Follow up with Minus Breeding, MD.   Specialty:  Cardiology   Why:  The office will call.   Contact information:   [address] [place] [address] [phone]       BRING ALL MEDICATIONS WITH YOU TO FOLLOW UP APPOINTMENTS  Time spent with patient to include physician time: 48 min Signed: Rosaria Ferries, PA-C 10/10/2014, 3:59 PM Co-Sign MD

## 2014-10-10 NOTE — Progress Notes (Signed)
Patient Name: Elaine Le Date of Encounter: 10/10/2014  Principal Problem:   Acute congestive heart failure secondary to severe MR Active Problems:   Severe mitral regurgitation   COPD (chronic obstructive pulmonary disease)-mild   Acute on chronic respiratory failure with hypoxia   CAD- mild, non obstructive at cath 07/27/14   Chronic diastolic heart failure   Pulmonary edema   Severe mitral regurgitation by prior echocardiogram   Primary Cardiologist: Dr Percival Spanish  Patient Profile: 54 yo female w/ hx severe MR, mod MS, emphysema. Had resp failure (ETT) in 05/2014. 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 Cath w/ no sig CAD, MV surgery planned in a few weeks. Admitted 06/10 w/ CHF.   SUBJECTIVE: Still feels like she has extra fluid in her abdomen, used O2 to get sleep last pm, sinuses are doing a little better.  OBJECTIVE Filed Vitals:   10/09/14 1547 10/09/14 2004 10/10/14 0517 10/10/14 0929  BP: 107/67 109/57 121/56 108/58  Pulse: 82 75 73 77  Temp: 98.9 F (37.2 C) 98.8 F (37.1 C) 98.1 F (36.7 C)   TempSrc: Oral Oral Oral   Resp: 18 18 18 18   Height:      Weight:   104 lb 14.4 oz (47.582 kg)   SpO2: 96% 98% 98% 98%    Intake/Output Summary (Last 24 hours) at 10/10/14 1329 Last data filed at 10/10/14 1212  Gross per 24 hour  Intake   1250 ml  Output   2250 ml  Net  -1000 ml   Filed Weights   10/08/14 0614 10/09/14 0630 10/10/14 0517  Weight: 105 lb 8 oz (47.854 kg) 106 lb 3.2 oz (48.172 kg) 104 lb 14.4 oz (47.582 kg)    PHYSICAL EXAM General: Well developed, well nourished, female in no acute distress. Head: Normocephalic, atraumatic.  Neck: Supple without bruits, JVD 8-9 cm, higher on L Lungs:  Resp regular and unlabored, CTA. Heart: RRR, S1, S2, no S3, S4, soft SEM; no rub. Abdomen: Soft, non-tender, non-distended, BS + x 4.  Extremities: No clubbing, cyanosis, no LE edema.  Neuro: Alert and oriented X 3. Moves all extremities spontaneously. Psych:  Normal affect.  LABS: CBC: Recent Labs  10/08/14 0453  WBC 11.8*  NEUTROABS 9.1*  HGB 14.6  HCT 44.2  MCV 90.9  PLT 357   Basic Metabolic Panel: Recent Labs  10/08/14 0453 10/09/14 0440  NA 134* 137  K 3.9 4.3  CL 94* 97*  CO2 30 31  GLUCOSE 121* 110*  BUN 7 8  CREATININE 0.58 0.63  CALCIUM 9.1 9.3   BNP:  B NATRIURETIC PEPTIDE  Date/Time Value Ref Range Status  10/08/2014 04:53 AM 171.8* 0.0 - 100.0 pg/mL Final  10/06/2014 10:57 AM 309.4* 0.0 - 100.0 pg/mL Final    TELE:       Dg Chest Portable 1 View 10/06/2014   CLINICAL DATA:  Dyspnea and hypoxia  EXAM: PORTABLE CHEST - 1 VIEW  COMPARISON:  07/29/2014  FINDINGS: There is diffuse interstitial fluid and moderate vascular congestion, consistent with congestive heart failure. There is no confluent airspace consolidation. There is no large effusion. The vascular and interstitial changes are significantly worsened from 07/29/2014.  IMPRESSION: Congestive heart failure   Electronically Signed   By: Andreas Newport M.D.   On: 10/06/2014 01:47   Ct Maxillofacial Wo Cm 10/08/2014   CLINICAL DATA:  Bilateral maxillary pressure and pain. Frontal pain. History of pneumonia. Oxygen desaturation. Maxillary sinusitis.  EXAM: CT MAXILLOFACIAL WITHOUT CONTRAST  TECHNIQUE: Multidetector CT imaging of the maxillofacial structures was performed. Multiplanar CT image reconstructions were also generated. A small metallic BB was placed on the right temple in order to reliably differentiate right from left.  COMPARISON:  01/31/2011  FINDINGS: Soft tissues: Limited intracranial imaging is grossly unremarkable. Normal it orbits and globes.  Bones: Clear mastoid air cells. Minimal mucosal thickening of right greater than left ethmoid air cells. Minimal maxillary sinus mucosal thickening with a right maxillary sinus mucous retention cyst or polyp of 11 mm. No fluid levels in the paranasal sinuses. No osseous destruction. No middle ear fluid.   IMPRESSION: Minimal mucosal thickening within the paranasal sinuses with a right maxillary sinus mucous retention cyst or polyp. No sinus air-fluid levels.   Electronically Signed   By: Abigail Miyamoto M.D.   On: 10/08/2014 12:32     Current Medications:  . clotrimazole  1 Applicatorful Vaginal QHS  . enoxaparin (LOVENOX) injection  40 mg Subcutaneous Q24H  . famotidine  20 mg Oral Daily  . furosemide  20 mg Oral Daily  . levofloxacin  750 mg Oral Daily  . losartan  25 mg Oral Daily  . potassium chloride SA  20 mEq Oral Daily  . sodium chloride  3 mL Intravenous Q12H      ASSESSMENT AND PLAN: Principal Problem:   Acute congestive heart failure secondary to severe MR - improved, but she still feels volume overloaded. - will give Lasix 40 mg x 1 and follow results - recheck BMET in am or as OP - possible d/c later if doing better - will need to give her extra Lasix to take as OP PRN for weight gain.  Otherwise, stable.   Active Problems:   Severe mitral regurgitation   COPD (chronic obstructive pulmonary disease)-mild   Acute on chronic respiratory failure with hypoxia   CAD- mild, non obstructive at cath 07/27/14   Chronic diastolic heart failure   Pulmonary edema   Severe mitral regurgitation by prior echocardiogram   Signed, Barrett, Rhonda , PA-C 1:29 PM 10/10/2014   Patient seen and examined. Agree with assessment and plan. I/O since admission -3970. Feels better following iv lasix. Sinuses less tender.  Ok to [employer] today. Keep appointment with Dr. Roxy Manns. Continue 5 day course of levaquin.   Troy Sine, MD, [facility] 10/10/2014 2:44 PM

## 2014-10-11 ENCOUNTER — Telehealth: Payer: Self-pay | Admitting: Cardiology

## 2014-10-11 NOTE — Telephone Encounter (Signed)
Needs a D/C phone call .Marland Kitchen Appt is 11/17/14 at 9:45am .. Thanks

## 2014-10-13 ENCOUNTER — Encounter: Payer: Self-pay | Admitting: *Deleted

## 2014-10-13 NOTE — Telephone Encounter (Signed)
This encounter was created in error - please disregard.

## 2014-10-17 ENCOUNTER — Inpatient Hospital Stay: Payer: Medicare Other | Admitting: Family Medicine

## 2014-10-20 NOTE — Telephone Encounter (Signed)
Patient contacted regarding discharge from [facility] on 10/10/14.  Patient understands to follow up with provider Dr Percival Spanish on 11/17/14 at 9:45a at [facility]. Patient understands discharge instructions? yes Patient understands medications and regiment? yes Patient understands to bring all medications to this visit? yes  Patient will be having a valve replacement in the near future. She has chosen to have a porcine valve and wanted to know if she should be taking anti-rejection drugs. Advised patient that I didn't think so but, also was not sure. Advised her to ask Dr Roxy Manns at her follow-up appt next Tuesday, 10/26/14.

## 2014-10-25 ENCOUNTER — Ambulatory Visit: Payer: Medicare Other | Admitting: Thoracic Surgery (Cardiothoracic Vascular Surgery)

## 2014-10-26 ENCOUNTER — Encounter: Payer: Self-pay | Admitting: Thoracic Surgery (Cardiothoracic Vascular Surgery)

## 2014-10-26 ENCOUNTER — Ambulatory Visit (INDEPENDENT_AMBULATORY_CARE_PROVIDER_SITE_OTHER): Payer: Medicare Other | Admitting: Thoracic Surgery (Cardiothoracic Vascular Surgery)

## 2014-10-26 VITALS — BP 115/69 | HR 71 | Resp 20 | Ht 60.0 in | Wt 104.0 lb

## 2014-10-26 DIAGNOSIS — I059 Rheumatic mitral valve disease, unspecified: Secondary | ICD-10-CM | POA: Diagnosis not present

## 2014-10-26 DIAGNOSIS — I5033 Acute on chronic diastolic (congestive) heart failure: Secondary | ICD-10-CM | POA: Diagnosis not present

## 2014-10-26 DIAGNOSIS — I5032 Chronic diastolic (congestive) heart failure: Secondary | ICD-10-CM

## 2014-10-26 DIAGNOSIS — I340 Nonrheumatic mitral (valve) insufficiency: Secondary | ICD-10-CM | POA: Diagnosis not present

## 2014-10-26 MED ORDER — ALPRAZOLAM 0.25 MG PO TABS: 0.2500 mg | ORAL_TABLET | Freq: Every evening | ORAL | Status: DC | PRN

## 2014-10-26 MED ORDER — AMIODARONE HCL 200 MG PO TABS: 200.0000 mg | ORAL_TABLET | Freq: Two times a day (BID) | ORAL | Status: DC

## 2014-10-26 NOTE — Progress Notes (Signed)
[address]       [place] [address]             [phone]     CARDIOTHORACIC SURGERY OFFICE NOTE  Referring Provider is Stanford Breed, Denice Bors, MD  and Burnell Blanks, MD Primary Cardiologist is Darlin Coco, MD Primary Pulmonologist is Simonne Maffucci, MD  PCP is Doristine Locks, NP   HPI:  Patient is a 54 year old female with rheumatic mitral valve disease including severe mitral regurgitation was mild to moderate mitral stenosis who returns to the office today for follow-up with tentative plans to proceed with minimally invasive mitral valve repair or replacement on 11/08/2014. She was last seen here in our office on 10/02/2014.  Shortly after that she was hospitalized briefly with acute exacerbation of shortness of breath and productive cough. She also developed some facial pain and tenderness, and a CT scan revealed signs of possible sinus infection.  She was treated with oral Levaquin. Symptoms improved. She returns to the office today for follow-up.  She states that she is feeling well. She still has moderate symptoms of exertional shortness of breath consistent with chronic diastolic congestive heart failure, New York Heart Association functional class II. She has no fevers or chills. She has not been having sinus drainage or productive cough and she has no fevers.   Current Outpatient Prescriptions  Medication Sig Dispense Refill  . acetaminophen (TYLENOL) 325 MG tablet Take 2 tablets (650 mg total) by mouth every 4 (four) hours as needed for mild pain (temp > 101.5).    Marland Kitchen aspirin 81 MG tablet Take 81 mg by mouth daily. Takes 2 tabs daily    . cetirizine (ZYRTEC) 10 MG tablet Take 10 mg by mouth daily.    . clotrimazole (GYNE-LOTRIMIN) 1 % vaginal cream Place 1 Applicatorful vaginally at bedtime. 45 g 0  . furosemide (LASIX) 40 MG tablet Take 1 tablet (40 mg total) by mouth daily. Take an extra tablet daily as needed for weight gain > 3 lbs  in a day or 5 lbs in a week. (Patient taking differently: Take 80 mg by mouth daily. Take an extra tablet daily as needed for weight gain > 3 lbs in a day or 5 lbs in a week.) 45 tablet 11  . ibuprofen (ADVIL,MOTRIN) 200 MG tablet Take 400 mg by mouth every 6 (six) hours as needed (pain).     Marland Kitchen losartan (COZAAR) 25 MG tablet Take 1 tablet (25 mg total) by mouth daily. 30 tablet 11  . polyethylene glycol powder (GLYCOLAX) powder Take 255 g by mouth daily as needed. (Patient taking differently: Take 1 Container by mouth daily as needed for mild constipation. ) 255 g 12  . potassium chloride SA (K-DUR,KLOR-CON) 20 MEQ tablet Take 1 tablet (20 mEq total) by mouth daily. Take an additional potassium tablet every time he take an extra Lasix. 45 tablet 11  . ranitidine (ZANTAC) 150 MG tablet TAKE 1 TABLET BY MOUTH EVERY DAY 60 tablet 10  . sodium chloride (OCEAN) 0.65 % nasal spray Place 2 sprays into the nose daily as needed for congestion (congestion).     . [DISCONTINUED] pantoprazole (PROTONIX) 40 MG tablet Take 1 tablet (40 mg total) by mouth daily. 30 tablet 11   No current facility-administered medications for this visit.      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 I have personally reviewed the patient's recent transthoracic and transesophageal echocardiograms and diagnostic cardiac catheterization. The patient has classical rheumatic features with severe thickening and restricted leaflet mobility involving both the  anterior and posterior leaflets of the mitral valve with foreshortening of the subvalvular apparatus. Left ventricular size and systolic function remain normal. The patient does not have significant coronary artery disease. The patient's valvular heart disease is undoubtedly chronic, and the development of symptoms of congestive heart failure were exacerbated by the patient's recent severe illness with influenza A and community acquired pneumonia. In addition, the patient has long-standing tobacco abuse with COPD, although recent follow up PFT's revealed only mild airflow obstruction. She appears to be a relatively good candidate for minimally invasive approach for surgery.   At present the patient continues to do well on medical therapy for chronic diastolic congestive heart failure with only mild symptoms of exertional shortness of breath with more strenuous activity. She continues to abstain from any tobacco use. Options include continued close observation on medical therapy versus proceeding with minimally invasive mitral valve repair or replacement in the near future. There is no question that she will ultimately require mitral valve surgery, but the likelihood that her valve could be successfully repaired with anticipation of excellent long-term result is muted by the presence of severe rheumatic disease.    Plan:  The patient was again counseled at length regarding the indications, risks and potential benefits of mitral valve repair or replacement. The rationale for elective surgery has been explained, including a comparison between surgery and continued medical therapy with close follow-up. The likelihood of successful and durable valve repair has been discussed with particular reference to the findings of their recent echocardiogram. Based upon these findings and previous experience, I have quoted her a <25 percent likelihood of successful valve repair. In the event that her valve cannot  be successfully repaired, we discussed the possibility of replacing the mitral valve using a mechanical prosthesis with the attendant need for long-term anticoagulation versus the alternative of replacing it using a bioprosthetic tissue valve with its potential for late structural valve deterioration and failure, depending upon the patient's longevity. The patient specifically requests that if the mitral valve must be replaced that it be done using a bioprosthetic tissue valve. She does not feel that she could reliably take Coumadin for the rest of her life. Under the circumstances, we might consider an attempted valve repair if at all feasible, although there is still a high likelihood that her valve would need to be replaced.    We plan to proceed with minimally invasive mitral valve repair or replacement on Wednesday, 11/08/2014.  The patient was counseled at length regarding the indications, risks and potential benefits of surgery.  Alternative treatment strategies were discussed. They understand and accept all potential risks of surgery including but not limited to risk of death, stroke or other neurologic complication, myocardial infarction, congestive heart failure, respiratory failure, renal failure, bleeding requiring transfusion and/or reexploration, arrhythmia, infection or other wound complications, pneumonia, pleural  and/or pericardial effusion, pulmonary embolus, aortic dissection or other major vascular complication, or delayed complications related to valve repair or replacement including but not limited to structural valve deterioration and failure, thrombosis, embolization, endocarditis, or paravalvular leak.  All of their questions have been answered.  Alternative surgical approaches have been discussed including a comparison between conventional sternotomy and minimally-invasive techniques.  The relative risks and benefits of each have been reviewed as they pertain to the patient's  specific circumstances, and all of their questions have been addressed.  Specific risks potentially related to the minimally-invasive approach were discussed at length, including but not limited to risk of conversion to full or partial sternotomy, aortic dissection or other major vascular complication, unilateral acute lung injury or pulmonary edema, phrenic nerve dysfunction or paralysis, rib fracture, chronic pain, lung hernia, or lymphocele.  All of the patient's questions have been addressed. She has been given a prescription for amiodarone to begin 7 days prior to her surgery to decrease her risk of perioperative atrial dysrhythmias.     I spent in excess of 30 minutes during the conduct of this office consultation and >50% of this time involved direct face-to-face encounter with the patient for counseling and/or coordination of their care.   Valentina Gu. Roxy Manns, MD 10/26/2014 1:15 PM

## 2014-10-26 NOTE — Patient Instructions (Signed)
Patient has been instructed to stop taking aspirin  Begin taking amiodarone 7 days before your surgery  Patient should continue taking all other medications without change through the day before surgery.  Patient should have nothing to eat or drink after midnight the night before surgery.  On the morning of surgery patient should take only Zantac with a sip of water.

## 2014-11-06 ENCOUNTER — Other Ambulatory Visit: Payer: Self-pay | Admitting: *Deleted

## 2014-11-06 ENCOUNTER — Encounter (HOSPITAL_COMMUNITY)
Admission: RE | Admit: 2014-11-06 | Discharge: 2014-11-06 | Disposition: A | Discharge status: Home / Self Care | Payer: Medicare Other | Source: Ambulatory Visit | Attending: Thoracic Surgery (Cardiothoracic Vascular Surgery) | Admitting: Thoracic Surgery (Cardiothoracic Vascular Surgery)

## 2014-11-06 ENCOUNTER — Encounter (HOSPITAL_COMMUNITY): Payer: Self-pay

## 2014-11-06 VITALS — BP 110/66 | HR 78 | Temp 98.7°F | Resp 20 | Ht 60.0 in | Wt 111.2 lb

## 2014-11-06 DIAGNOSIS — Z0183 Encounter for blood typing: Secondary | ICD-10-CM

## 2014-11-06 DIAGNOSIS — I340 Nonrheumatic mitral (valve) insufficiency: Secondary | ICD-10-CM

## 2014-11-06 DIAGNOSIS — Z01812 Encounter for preprocedural laboratory examination: Secondary | ICD-10-CM | POA: Insufficient documentation

## 2014-11-06 DIAGNOSIS — R9431 Abnormal electrocardiogram [ECG] [EKG]: Secondary | ICD-10-CM

## 2014-11-06 DIAGNOSIS — Z01818 Encounter for other preprocedural examination: Secondary | ICD-10-CM | POA: Insufficient documentation

## 2014-11-06 DIAGNOSIS — J90 Pleural effusion, not elsewhere classified: Secondary | ICD-10-CM

## 2014-11-06 LAB — CBC
HCT: 42.3 % (ref 36.0–46.0)
Hemoglobin: 14.3 g/dL (ref 12.0–15.0)
MCH: 29.7 pg (ref 26.0–34.0)
MCHC: 33.8 g/dL (ref 30.0–36.0)
MCV: 87.8 fL (ref 78.0–100.0)
Platelets: 257 10*3/uL (ref 150–400)
RBC: 4.82 MIL/uL (ref 3.87–5.11)
RDW: 13.2 % (ref 11.5–15.5)
WBC: 7.8 10*3/uL (ref 4.0–10.5)

## 2014-11-06 LAB — ABO/RH: ABO/RH(D): A NEG

## 2014-11-06 LAB — URINALYSIS, ROUTINE W REFLEX MICROSCOPIC
Bilirubin Urine: NEGATIVE
GLUCOSE, UA: NEGATIVE mg/dL
Hgb urine dipstick: NEGATIVE
KETONES UR: NEGATIVE mg/dL
Leukocytes, UA: NEGATIVE
Nitrite: NEGATIVE
PROTEIN: NEGATIVE mg/dL
SPECIFIC GRAVITY, URINE: 1.001 — AB (ref 1.005–1.030)
Urobilinogen, UA: 0.2 mg/dL (ref 0.0–1.0)
pH: 6.5 (ref 5.0–8.0)

## 2014-11-06 LAB — BLOOD GAS, ARTERIAL
Acid-Base Excess: 0.1 mmol/L (ref 0.0–2.0)
BICARBONATE: 23.2 meq/L (ref 20.0–24.0)
DRAWN BY: 428831
FIO2: 0.21 %
O2 SAT: 99.1 %
PH ART: 7.489 — AB (ref 7.350–7.450)
Patient temperature: 98.6
TCO2: 24.1 mmol/L (ref 0–100)
pCO2 arterial: 30.9 mmHg — ABNORMAL LOW (ref 35.0–45.0)
pO2, Arterial: 141 mmHg — ABNORMAL HIGH (ref 80.0–100.0)

## 2014-11-06 LAB — COMPREHENSIVE METABOLIC PANEL
ALT: 29 U/L (ref 14–54)
ANION GAP: 10 (ref 5–15)
AST: 24 U/L (ref 15–41)
Albumin: 4.2 g/dL (ref 3.5–5.0)
Alkaline Phosphatase: 87 U/L (ref 38–126)
BUN: 7 mg/dL (ref 6–20)
CHLORIDE: 105 mmol/L (ref 101–111)
CO2: 23 mmol/L (ref 22–32)
CREATININE: 0.6 mg/dL (ref 0.44–1.00)
Calcium: 9.6 mg/dL (ref 8.9–10.3)
GFR calc Af Amer: >60 mL/min (ref >60)
GLUCOSE: 117 mg/dL — AB (ref 65–99)
POTASSIUM: 3.7 mmol/L (ref 3.5–5.1)
Sodium: 138 mmol/L (ref 135–145)
Total Bilirubin: 0.7 mg/dL (ref 0.3–1.2)
Total Protein: 6.9 g/dL (ref 6.5–8.1)

## 2014-11-06 LAB — SURGICAL PCR SCREEN
MRSA, PCR: NEGATIVE
STAPHYLOCOCCUS AUREUS: NEGATIVE

## 2014-11-06 LAB — APTT: APTT: 29 s (ref 24–37)

## 2014-11-06 LAB — PROTIME-INR
INR: 1.07 (ref 0.00–1.49)
Prothrombin Time: 14.1 seconds (ref 11.6–15.2)

## 2014-11-06 NOTE — Progress Notes (Signed)
Spoke with [employer] in [place]. Wants it specified on consent if replacement valve it must be tissue valve.  Dr. Roxy Manns aware that pt. Is not taking pacerone.

## 2014-11-06 NOTE — Pre-Procedure Instructions (Signed)
    Elaine Le  11/06/2014      [id number] - [place], [place] - [address] AT [address] [place] [address] Phone: [phone] Fax: [phone]    Your procedure is scheduled on 11-08-2014     Wednesday   Report to [facility] Admitting at 6:30 A.M.   Call this number if you have problems the morning of surgery:  [phone]   Remember:  Do not eat food or drink liquids after midnight .  Take these medicines the morning of surgery with A SIP OF WATER  none   Do not wear jewelry, make-up or nail polish.  Do not wear lotions, powders, or perfumes.    Do not shave 48 hours prior to surgery.    Do not bring valuables to the hospital.  [facility] is not responsible for any belongings or valuables.  Contacts, dentures or bridgework may not be worn into surgery.  Leave your suitcase in the car.  After surgery it may be brought to your room.  For patients admitted to the hospital, discharge time will be determined by your treatment team.   Special instructions:  See attached sheet "Preparing for Surgery" for instructions on CHG shower  Please read over the following fact sheets that you were given. Pain Booklet, Coughing and Deep Breathing, Blood Transfusion Information and Surgical Site Infection Prevention

## 2014-11-07 LAB — HEMOGLOBIN A1C
HEMOGLOBIN A1C: 6.3 % — AB (ref 4.8–5.6)
MEAN PLASMA GLUCOSE: 134 mg/dL

## 2014-11-07 MED ORDER — GLUTARALDEHYDE 0.625% SOAKING SOLUTION
TOPICAL | Status: AC
  Administered 2014-11-08: 1 via TOPICAL
  Filled 2014-11-07: qty 50

## 2014-11-07 MED ORDER — DEXTROSE 5 % IV SOLN
30.0000 ug/min | INTRAVENOUS | Status: AC
  Administered 2014-11-08: 10 ug/min via INTRAVENOUS
  Filled 2014-11-07 (×2): qty 2

## 2014-11-07 MED ORDER — MAGNESIUM SULFATE 50 % IJ SOLN
40.0000 meq | INTRAMUSCULAR | Status: DC
  Filled 2014-11-07: qty 10

## 2014-11-07 MED ORDER — SODIUM CHLORIDE 0.9 % IV SOLN
INTRAVENOUS | Status: AC
  Administered 2014-11-08: 1.6 [IU]/h via INTRAVENOUS
  Filled 2014-11-07: qty 2.5

## 2014-11-07 MED ORDER — DOPAMINE-DEXTROSE 3.2-5 MG/ML-% IV SOLN
0.0000 ug/kg/min | INTRAVENOUS | Status: DC
Start: 2014-11-08 — End: 2014-11-08
  Filled 2014-11-07: qty 250

## 2014-11-07 MED ORDER — VANCOMYCIN HCL 1000 MG IV SOLR
INTRAVENOUS | Status: AC
  Administered 2014-11-08: 1000 mL
  Filled 2014-11-07: qty 1000

## 2014-11-07 MED ORDER — POTASSIUM CHLORIDE 2 MEQ/ML IV SOLN
80.0000 meq | INTRAVENOUS | Status: DC
  Filled 2014-11-07 (×2): qty 40

## 2014-11-07 MED ORDER — NITROGLYCERIN IN D5W 200-5 MCG/ML-% IV SOLN
2.0000 ug/min | INTRAVENOUS | Status: DC
  Filled 2014-11-07: qty 250

## 2014-11-07 MED ORDER — PLASMA-LYTE 148 IV SOLN
INTRAVENOUS | Status: AC
  Administered 2014-11-08: 500 mL
  Filled 2014-11-07: qty 2.5

## 2014-11-07 MED ORDER — SODIUM CHLORIDE 0.9 % IV SOLN
INTRAVENOUS | Status: DC
  Filled 2014-11-07 (×2): qty 30

## 2014-11-07 MED ORDER — METOPROLOL TARTRATE 12.5 MG HALF TABLET: 12.5000 mg | ORAL_TABLET | ORAL | Status: DC

## 2014-11-07 MED ORDER — DEXTROSE 5 % IV SOLN
750.0000 mg | INTRAVENOUS | Status: DC
  Filled 2014-11-07: qty 750

## 2014-11-07 MED ORDER — DEXTROSE 5 % IV SOLN
1.5000 g | INTRAVENOUS | Status: AC
  Administered 2014-11-08: 1.5 g via INTRAVENOUS
  Administered 2014-11-08: 750 g via INTRAVENOUS
  Filled 2014-11-07 (×2): qty 1.5

## 2014-11-07 MED ORDER — SODIUM CHLORIDE 0.9 % IV SOLN
INTRAVENOUS | Status: AC
  Administered 2014-11-08: 69.8 mL/h via INTRAVENOUS
  Filled 2014-11-07: qty 40

## 2014-11-07 MED ORDER — EPINEPHRINE HCL 1 MG/ML IJ SOLN
0.0000 ug/min | INTRAVENOUS | Status: DC
  Filled 2014-11-07: qty 4

## 2014-11-07 MED ORDER — VANCOMYCIN HCL IN DEXTROSE 1-5 GM/200ML-% IV SOLN
1000.0000 mg | INTRAVENOUS | Status: AC
  Administered 2014-11-08: 1000 mg via INTRAVENOUS
  Filled 2014-11-07: qty 200

## 2014-11-07 MED ORDER — DEXMEDETOMIDINE HCL IN NACL 400 MCG/100ML IV SOLN
0.1000 ug/kg/h | INTRAVENOUS | Status: AC
Start: 2014-11-08 — End: 2014-11-08
  Administered 2014-11-08: .2 ug/kg/h via INTRAVENOUS
  Filled 2014-11-07: qty 100

## 2014-11-07 NOTE — H&P (Addendum)
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   The patient has no previous history of cardiac disease and had never been told that she had a heart murmur in the past. She denies any known history of rheumatic fever in the past. She has a long-standing history of tobacco abuse with suspected COPD, fibromyalgia with chronic pain, irritable bowel syndrome with chronic constipation, and chronic fatigue. She was admitted to [facility] on 06/23/2014 with acute respiratory failure with hypercapnia requiring ventilator support. She was diagnosed with influenza A and community acquired pneumonia. The patient was quite ill at the time of her initial presentation and required intravenous pressors for blood pressure support in addition to mechanical ventilation. Her subsequent hospitalization and convalescence was slow. She was initially extubated on 06/28/2014 but required reintubation later that same day. She was extubated a second time on 06/30/2014 and required BiPAP support for the next several days. A transthoracic echocardiogram was performed 07/04/2014 that revealed normal left ventricular systolic function with ejection fraction estimated 60-65%. There was a rheumatic appearing mitral valve with a "shaggy density" on the anterior leaflet of the mitral valve raising the  question of possible vegetation. The patient was seen in consultation by Dr. Mare Ferrari from the cardiology team, and transesophageal echocardiogram was recommended. 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 There was no vegetation seen. Outpatient cardiology follow-up was planned. The patient was subsequently discharged from the hospital on 07/11/2014. The patient was not discharged from the hospital on any diuretics or other medications related to her underlying heart disease. Over the subsequent 2 weeks the patient reports the development of gradual progression of shortness of breath, orthopnea, and bilateral lower extremity edema. She also had some flulike symptoms with nonproductive cough and atypical chest pain. Symptoms became acutely worse and the patient presented to the emergency department 07/25/2014. Chest x-ray showed possible right lower lobe pneumonia but symptoms improved rapidly with intravenous diuresis and bronchodilator therapy. The patient was transferred to [facility] where she underwent left and right heart catheterization on 07/27/2014 by Dr. Angelena Form. The patient was found to have normal coronary artery anatomy with non-obstructive coronary artery disease. There was normal left ventricular systolic function with elevated left heart filling pressures and pulmonary hypertension. Cardiothoracic surgical consultation was requested. She was initially seen in consultation on 07/28/2014.   The patient is single and lives alone in [place]. She has been disabled for more than 20 years because of fibromyalgia with chronic pain and fatigue. She requires long-term narcotic pain relievers. She lives a somewhat sedentary lifestyle but she reports no particular physical limitations other than that caused by fatigue and chronic pain. She has a long-standing history of exertional shortness of  breath. She reports occasional tightness across her chest  that seems to develop sporadically and is not related to physical activity. She has occasional dizzy spells without palpitations or syncope. She has only recently developed severe exertional shortness of breath, PND, orthopnea, and lower extremity edema since her recent hospitalization. With diuretic therapy over the last few weeks she feels much better, although her lasix dose was increased recently due to swelling. She currently reports no shortness of breath. She can lay flat in bed. She has not smoked any cigarettes since she was discharged from the hospital in March.  Patient returns to the office today for follow-up with tentative plans to proceed with minimally invasive mitral valve repair or replacement on 11/08/2014. She was last seen here in our office on 10/02/2014. Shortly after that she was hospitalized briefly with acute exacerbation of shortness of breath and productive cough. She also developed some facial pain and tenderness, and a CT scan revealed signs of possible sinus infection. She was treated with oral Levaquin. Symptoms improved. She returns to the office today for follow-up. She states that she is feeling well. She still has moderate symptoms of exertional shortness of breath consistent with chronic diastolic congestive heart failure, New York Heart Association functional class II. She has no fevers or chills. She has not been having sinus drainage or productive cough and she has no fevers.     Past Medical History  Diagnosis Date  . Chronic sinusitis   . Arthritis   . Fibromyalgia   . GERD (gastroesophageal reflux disease)   . Irritable bowel syndrome (IBS)   . Headaches, cluster   . Back pain   . Fatigue   . Muscle pain   . Night sweats   . Diverticulosis   . Adenomatous colon polyp   . Internal hemorrhoids   . Hiatal hernia   . Pneumonia   . Mitral valve regurgitation     severe  . COPD (chronic  obstructive pulmonary disease)-PFT pending  07/17/2014  . Community acquired pneumonia   . CN (constipation) 05/25/2014  . Acute respiratory failure with hypercapnia 06/21/2014  . Hx of adenomatous colonic polyps 03/10/2011    Oct 2012, repeat colon Oct 2017   . IBS (irritable bowel syndrome) 01/21/2011  . Influenza A 06/29/2014  . Muscular deconditioning 07/05/2014  . Pleural effusion   . Protein-calorie malnutrition, severe 07/06/2014  . Chronic pain syndrome   . Tobacco abuse   . Chronic diastolic heart failure     related to MR/MS  . 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/10/2014    Severe mitral regurgitation with moderate mitral stenosis  . Severe mitral regurgitation by prior echocardiogram 07/11/2014  . Mitral valve stenosis, moderate 07/11/2014  . Acute systolic heart failure 08/537    Past Surgical History  Procedure Laterality Date  . Thoracic outlet surgery    . Tubal ligation    . Vulva surgery    . Neuroplasty / transposition median nerve at carpal tunnel bilateral    . Cystostomy w/ bladder dilation      at age 39  . Tee without cardioversion N/A 07/10/2014    Procedure: TRANSESOPHAGEAL ECHOCARDIOGRAM (TEE);  Surgeon: Lelon Perla, MD;  Location: [facility] ENDOSCOPY;  Service: Cardiovascular;  55-60%. No regional wall motion modalities. Moderate mitral stenosis with severe regurgitation and moderate to severely dilated left atrium.  . Left and right heart catheterization with coronary angiogram N/A 07/27/2014    Procedure: LEFT AND RIGHT HEART CATHETERIZATION WITH CORONARY ANGIOGRAM;  Surgeon: Burnell Blanks, MD;  Location: [facility]  CATH LAB;  Right left heart catheterization: Mild/minimal coronary artery disease. RV: 50/7/17, PA: 48/29 (mean 38), PCWP 33 mmHg  . Transthoracic echocardiogram  07/04/2014    Normal LV Size/Fnx - EF 60-65% no RWMA; MV: thickened leaflets with doming, fixed Posterior leaflet, anterior leaflet w/ large/shaggy & mobile density.  c/w Rheumatic Mitral disease -  moderate MS & Mod-Severe MR.    Family History  Problem Relation Age of Onset  . Kidney cancer Mother   . Colon polyps Mother   . Irritable bowel syndrome Mother   . Diabetes Sister   . Liver disease Sister   . Irritable bowel syndrome Daughter   . Diabetes Brother   . Colon cancer Neg Hx     Social History History  Substance Use Topics  . Smoking status: Former Smoker -- 0.50 packs/day for 35 years    Types: Cigarettes    Quit date: 06/17/2014  . Smokeless tobacco: Never Used  . Alcohol Use: No     Comment: rare    Prior to Admission medications   Medication Sig Start Date End Date Taking? Authorizing Provider  ALPRAZolam (XANAX) 0.25 MG tablet Take 1 tablet (0.25 mg total) by mouth at bedtime as needed for anxiety or sleep. 10/26/14  Yes Rexene Alberts, MD  clotrimazole (GYNE-LOTRIMIN) 1 % vaginal cream Place 1 Applicatorful vaginally at bedtime. 10/10/14  Yes Rhonda G Barrett, PA-C  furosemide (LASIX) 40 MG tablet Take 1 tablet (40 mg total) by mouth daily. Take an extra tablet daily as needed for weight gain > 3 lbs in a day or 5 lbs in a week. Patient taking differently: Take 80 mg by mouth daily.  10/10/14  Yes Rhonda G Barrett, PA-C  ibuprofen (ADVIL,MOTRIN) 200 MG tablet Take 400 mg by mouth 2 (two) times daily as needed (pain).    Yes Historical Provider, MD  loratadine (CLARITIN) 10 MG tablet Take 10 mg by mouth at bedtime.   Yes Historical Provider, MD  naproxen sodium (ANAPROX) 220 MG tablet Take 440 mg by mouth daily as needed (pain). ALEVE   Yes Historical Provider, MD  phenylephrine (SUDAFED PE) 10 MG TABS tablet Take 10 mg by mouth at bedtime.   Yes Historical Provider, MD  polyethylene glycol powder (GLYCOLAX) powder Take 255 g by mouth daily as needed. Patient taking differently: Take 17 g by mouth daily as needed for mild constipation.  07/11/14  Yes Grace Bushy Minor, NP  potassium chloride SA (K-DUR,KLOR-CON) 20 MEQ tablet Take 1 tablet (20 mEq total) by mouth  daily. Take an additional potassium tablet every time he take an extra Lasix. Patient taking differently: Take 20 mEq by mouth daily.  10/10/14  Yes Rhonda G Barrett, PA-C  ranitidine (ZANTAC) 150 MG tablet TAKE 1 TABLET BY MOUTH EVERY DAY Patient taking differently: Take 150 mg by mouth at bedtime.  05/25/14  Yes Jerene Bears, MD  sodium chloride (OCEAN) 0.65 % nasal spray Place 2 sprays into both nostrils every 4 (four) hours as needed for congestion.    Yes Historical Provider, MD  acetaminophen (TYLENOL) 325 MG tablet Take 2 tablets (650 mg total) by mouth every 4 (four) hours as needed for mild pain (temp > 101.5). Patient not taking: Reported on 11/03/2014 07/29/14   Erlene Quan, PA-C  amiodarone (PACERONE) 200 MG tablet Take 1 tablet (200 mg total) by mouth 2 (two) times daily with a meal. Patient not taking: Reported on 11/03/2014 10/26/14   Rexene Alberts, MD  losartan (COZAAR) 25 MG tablet Take 1 tablet (25 mg total) by mouth daily. Patient not taking: Reported on 11/03/2014 10/10/14   Evelene Croon Barrett, PA-C  neomycin-bacitracin-polymyxin (NEOSPORIN) ointment Apply 1 application topically as needed for wound care. apply to eye    Historical Provider, MD    Allergies  Allergen Reactions  . Morphine And Related Other (See Comments)    Altered mental state  . Other Other (See Comments)    Any antidepressants per pt- causes altered mental state, anger      Review of Systems:  General:normal appetite, decreased energy, + weight gain prior to admission, + weight loss with diuresis, no fever Cardiac:no chest pain with exertion, occasional brief episodes of chest pain at rest, + SOB with exertion, + resting SOB at presentation but none presently, + PND, + orthopnea, no palpitations, no arrhythmia, no atrial fibrillation, + LE edema, + dizzy spells, no syncope Respiratory:+ shortness of breath, no home  oxygen, no productive cough, + dry cough, no bronchitis, + wheezing, no hemoptysis, no asthma, no pain with inspiration or cough, no sleep apnea, no CPAP at night GI:no difficulty swallowing, no reflux, no frequent heartburn, no hiatal hernia, no abdominal pain, + chronic constipation, no diarrhea, no hematochezia, no hematemesis, no melena GU:no dysuria, no frequency, no urinary tract infection, no hematuria, no kidney stones, no kidney disease Vascular:no pain suggestive of claudication, no pain in feet, no leg cramps, no varicose veins, no DVT, no non-healing foot ulcer Neuro:no stroke, no TIA's, no seizures, no headaches, no temporary blindness one eye, no slurred speech, no peripheral neuropathy, + chronic pain, no instability of gait, no memory/cognitive dysfunction Musculoskeletal:mild arthritis in hips, no joint swelling, + chronic myalgias, no difficulty walking, normal mobility  Skin:no rash, no itching, no skin infections, no pressure sores or ulcerations Psych:+ anxiety, no depression, + nervousness, no unusual recent stress Eyes:no blurry vision, no floaters, no recent vision changes, does not wears glasses or contacts ENT:no hearing loss, no loose or painful teeth, no dentures, last saw dentist within the past 4 months Hematologic:+ easy bruising, no abnormal bleeding, no clotting disorder, no frequent epistaxis Endocrine:no diabetes, does not check CBG's at home   Physical Exam:  BP 105/62 mmHg  Pulse 78  Temp(Src) 98.2 F (36.8 C) (Oral)  Resp 18  Ht 5'  (1.524 m)  Wt 47.5 kg (104 lb 11.5 oz)  BMI 20.45 kg/m2  SpO2 97% General:Thin, somewhat malnourished but o/w well-appearing HEENT:Unremarkable  Neck:no JVD, no bruits, no adenopathy  Chest:clear to auscultation with few scattered crackles and rhonchi, symmetrical breath sounds, no wheezes CV:RRR, grade III/VI systolic murmur  Abdomen:soft, non-tender, no masses Extremities:warm, well-perfused, pulses diminished but palpable, no lower extremity edema Rectal/GUDeferred Neuro:Grossly non-focal and symmetrical throughout Skin:Clean and dry, no rashes, no breakdown  Diagnostic Tests:  Transthoracic Echocardiography  Patient:  Saraya, Tirey MR #:    [id number] Study Date: 07/04/2014 Gender:   F Age:    48 Height:   162.6 cm Weight:   48.2 kg BSA:    1.47 m^2 Pt. Status: Room:    1237  ADMITTING  Norman Herrlich, Pete ATTENDING  Juanito Doom REFERRING  [place] Dom E SONOGRAPHER Donata Clay PERFORMING  [facility], Inpatient  cc:  ------------------------------------------------------------------- LV EF: 60% -  65%  ------------------------------------------------------------------- Indications:   Respiratory Failure acute 518.81.  ------------------------------------------------------------------- History:  Risk factors: Current tobacco use.  ------------------------------------------------------------------- Study Conclusions  - Left ventricle: The cavity size was  normal. Systolic function was normal. The estimated ejection fraction was in the  range of 60% to 65%. Wall motion was normal; there were no regional wall motion abnormalities. - Aortic valve: Mildly calcified annulus. Trileaflet. Minimal diffuse calcification. There was trivial regurgitation. - Mitral valve: The mitral valve leaflets are thickened and there is evidence of diastolic doming of the anterior mitral valve leaftet. The posterior leaflet is thickened and appears fixed. The anterior mitral valve leaflet has a large shaggy mobile density on the LV side of the leaflet that is only appreciated in the apical 4 and 2 chamber views. It is unclear as to whether the patient has underlying rheumatic mitral stenosis and now has possible endocarditis. This needs to be evaluated further with TEE. Moderate diffuse thickening of the anterior leaflet. The findings are consistent with moderate stenosis. There was moderate regurgitation directed eccentrically and along the left atrial wall. Valve area by pressure half-time: 1.16 cm^2.  Transthoracic echocardiography. M-mode, complete 2D, spectral Doppler, and color Doppler. Birthdate: Patient birthdate: 11-Apr-1961. Age: Patient is 54 yr old. Sex: Gender: female. BMI: 18.2 kg/m^2. Blood pressure:   92/53 Patient status: Inpatient. Study date: Study date: 07/04/2014. Study time: 09:12 AM. Location: ICU/CCU  -------------------------------------------------------------------  ------------------------------------------------------------------- Left ventricle: The cavity size was normal. Systolic function was normal. The estimated ejection fraction was in the range of 60% to 65%. Wall motion was normal; there were no regional wall motion abnormalities.  ------------------------------------------------------------------- Aortic valve:  Mildly calcified annulus. Trileaflet. Minimal diffuse calcification. Mobility was not restricted. Doppler: Transvalvular velocity was within the  normal range. There was no stenosis. There was trivial regurgitation.  ------------------------------------------------------------------- Aorta: Aortic root: The aortic root was normal in size.  ------------------------------------------------------------------- Mitral valve: The mitral valve leaflets are thickened and there is evidence of diastolic doming of the anterior mitral valve leaftet. The posterior leaflet is thickened and appears fixed. The anterior mitral valve leaflet has a large shaggy mobile density on the LV side of the leaflet that is only appreciated in the apical 4 and 2 chamber views. It is unclear as to whether the patient has underlying rheumatic mitral stenosis and now has possible endocarditis. This needs to be evaluated further with TEE. Moderate diffuse thickening of the anterior leaflet. Doppler: The findings are consistent with moderate stenosis.  There was moderate regurgitation directed eccentrically and along the left atrial wall.  Valve area by pressure half-time: 1.16 cm^2. Indexed valve area by pressure half-time: 0.79 cm^2/m^2.  Peak gradient (D): 15 mm Hg.  ------------------------------------------------------------------- Left atrium: The atrium was normal in size.  ------------------------------------------------------------------- Right ventricle: The cavity size was normal. Wall thickness was normal. Systolic function was normal.  ------------------------------------------------------------------- Pulmonic valve:  Structurally normal valve.  Cusp separation was normal. Doppler: Transvalvular velocity was within the normal range. There was no evidence for stenosis. There was no regurgitation.  ------------------------------------------------------------------- Tricuspid valve:  Structurally normal valve.  Doppler: Transvalvular velocity was within the normal range. There was trivial  regurgitation.  ------------------------------------------------------------------- Pulmonary artery:  The main pulmonary artery was normal-sized. Systolic pressure was within the normal range.  ------------------------------------------------------------------- Right atrium: The atrium was normal in size.  ------------------------------------------------------------------- Pericardium: There was no pericardial effusion.  ------------------------------------------------------------------- Systemic veins: Inferior vena cava: The vessel was mildly dilated.  ------------------------------------------------------------------- Measurements  Left ventricle             Value     Reference LV ID, ED, PLAX chordal    (L)   36  mm    43 -  52 LV ID, ES, PLAX chordal        27  mm    23 - 38 LV fx shortening, PLAX chordal (L)   25  %    >=29 LV PW thickness, ED          7.7  mm    --------- IVS/LV PW ratio, ED          1.05      <=1.3 LV e&', lateral             3.7  cm/s   --------- LV E/e&', lateral            52.97     --------- LV e&', medial             7.18 cm/s   --------- LV E/e&', medial            27.3      --------- LV e&', average             5.44 cm/s   --------- LV E/e&', average            36.03     ---------  Ventricular septum           Value     Reference IVS thickness, ED           8.1  mm    ---------  Aorta                 Value     Reference Aortic root ID             27  mm    ---------  Left atrium              Value     Reference LA ID, A-P, ES             38  mm    --------- LA ID/bsa, A-P         (H)   2.59 cm/m^2  <=2.2 LA volume/bsa, S             32  ml/m^2  --------- LA volume/bsa, ES, 1-p A4C       26.5 ml/m^2  --------- LA volume/bsa, ES, 1-p A2C       34  ml/m^2  --------- LA volume/bsa, ES, 2-p         32  ml/m^2  --------- LA volume/bsa, ES, A/L         32  ml/m^2  ---------  Mitral valve              Value     Reference Mitral E-wave peak velocity      196  cm/s   --------- Mitral A-wave peak velocity      191  cm/s   --------- Mitral deceleration slope       479  cm/s^2  --------- Mitral pressure half-time       190  ms    --------- Mitral peak gradient, D        15  mm Hg  --------- Mitral E/A ratio, peak         1.03      --------- Mitral valve area, PHT, DP       1.16 cm^2   --------- Mitral valve area/bsa, PHT, DP     0.79 cm^2/m^2 ---------  Systemic veins             Value     Reference Estimated  CVP             8   mm Hg  ---------  Right ventricle            Value     Reference RV s&', lateral, S           9.68 cm/s   ---------  Legend: (L) and (H) mark values outside specified reference range.  ------------------------------------------------------------------- Prepared and Electronically Authenticated by  [name], MD 2016-03-08T12:40:59   Transesophageal Echocardiography  Patient:  Tashiba, Elaine Le MR #:    [id number] Study Date: 07/10/2014 Gender:   F Age:    55 Height:   162.6 cm Weight:   43.2 kg BSA:    1.38 m^2 Pt. Status: Room:    [place] SONOGRAPHER Florentina Jenny, RDCS ADMITTING  [place], [place]  Simonne Maffucci B ORDERING   [place], Christopher REFERRING  [place], Christopher  cc:  ------------------------------------------------------------------- LV EF: 55% -   60%  ------------------------------------------------------------------- Indications:   424.0 Mitral valve disease.  ------------------------------------------------------------------- Study Conclusions  - Left ventricle: Systolic function was normal. The estimated ejection fraction was in the range of 55% to 60%. Wall motion was normal; there were no regional wall motion abnormalities. - Aortic valve: There was trivial regurgitation. - Mitral valve: The findings are consistent with moderate stenosis. There was severe regurgitation. Valve area by pressure half-time: 1.55 cm^2. - Left atrium: The atrium was moderately to severely dilated. - Right atrium: No evidence of thrombus in the atrial cavity or appendage. - Atrial septum: No defect or patent foramen ovale was identified. - Tricuspid valve: No evidence of vegetation. - Pulmonic valve: No evidence of vegetation.  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  Diagnostic transesophageal echocardiography. 2D and color Doppler. Birthdate: Patient birthdate: August 09, 1960. Age: Patient is 53 yr old. Sex: Gender: female.  BMI: 16.3 kg/m^2. Blood pressure: 87/51 Patient status: Inpatient. Study date: Study date: 07/10/2014. Study time: 10:41 AM. Location: Endoscopy.  -------------------------------------------------------------------  ------------------------------------------------------------------- Left ventricle: Systolic function was normal. The estimated ejection fraction was in the range of 55% to 60%. Wall motion was normal; there were no regional wall motion abnormalities.  ------------------------------------------------------------------- Aortic valve:  Trileaflet. Doppler:  There was no stenosis. There was trivial regurgitation.  ------------------------------------------------------------------- Aorta:  Descending aorta: Mild atherosclerosis.  ------------------------------------------------------------------- Mitral valve: Thickened, rheumatic mitral valve. Doming of anterior leaflet. Restricted posterior leaflet. Doppler: The findings are consistent with moderate stenosis. Gradient overestimates severity due to mitral regurgitation. There was severe regurgitation.  Valve area by pressure half-time: 1.55 cm^2. Indexed valve area by pressure half-time: 1.12 cm^2/m^2. Mean gradient (D): 17 mm Hg.  ------------------------------------------------------------------- Left atrium: The atrium was moderately to severely dilated.  ------------------------------------------------------------------- Atrial septum: No defect or patent foramen ovale was identified.  ------------------------------------------------------------------- Right ventricle: The cavity size was normal. Systolic function was normal.  ------------------------------------------------------------------- Pulmonic valve:  Structurally normal valve.  Cusp separation was normal. No evidence of vegetation. Doppler: There was trivial regurgitation.  ------------------------------------------------------------------- Tricuspid valve:  Structurally normal valve.  Leaflet separation was normal. No evidence of vegetation. Doppler: There was mild regurgitation.  ------------------------------------------------------------------- Right atrium: The atrium was normal in size. No evidence of thrombus in the atrial cavity or appendage.  ------------------------------------------------------------------- Pericardium: There was no pericardial effusion.  ------------------------------------------------------------------- Measurements  Mitral valve            Value Mitral mean velocity, D  197  cm/s Mitral pressure half-time      115  ms Mitral mean gradient, D       17   mm Hg Mitral valve area, PHT, DP     1.55 cm^2 Mitral valve area/bsa, PHT, DP   1.12 cm^2/m^2 Mitral annulus VTI, D        84.5 cm  Legend: (L) and (H) mark values outside specified reference range.  ------------------------------------------------------------------- Prepared and Electronically Authenticated by  Kirk Ruths 2016-03-14T18:05:47      Cardiac Catheterization Operative Report  ABEL RA [id number] 3/31/20169:14 AM [name], MD  Procedure Performed:  1. Left Heart Catheterization 2. Selective Coronary Angiography 3. Right Heart Catheterization 4. Left ventricular angiogram  Operator: Lauree Chandler, MD  Indication: 54 yo female with history of rheumatic heart disease with moderate MS and severe MR.   Procedure Details: The risks, benefits, complications, treatment options, and expected outcomes were discussed with the patient. The patient and/or family concurred with the proposed plan, giving informed consent. The patient was brought to the cath lab after IV hydration was begun and oral premedication was given. The patient was further sedated with Versed and Fentanyl. The right groin was prepped and draped in the usual manner. Using the modified Seldinger access technique, a 5 French sheath was placed in the right femoral artery. A 7 French sheath was inserted into the right femoral vein. A balloon tipped catheter was used to perform a right heart catheterization. Standard diagnostic catheters were used to perform selective coronary angiography. A pigtail catheter was used to perform a left ventricular angiogram. There were no immediate complications. The patient was taken to the recovery area in stable condition.   Hemodynamic Findings: Ao: 85/47  LV: 79/4/11 RA: 12  RV: 50/7/17 PA: 48/29 (mean 38) PCWP: 33  Fick Cardiac Output: 5.2 L/min Fick  Cardiac Index: 3.67 L/min/m2 Central Aortic Saturation: 100% Pulmonary Artery Saturation: 77%  Angiographic Findings:  Left main: Short segment without obstructive disease.   Left Anterior Descending Artery: Large caliber vessel that courses to the apex. The proximal and mid vessel has diffuse 20% stenosis.   Circumflex Artery: Moderate caliber vessel with moderate caliber intermediate branch. The intermediate branch is free of obstructive disease. The AV groove Circumflex has a distal 20% stenosis.   Right Coronary Artery: Moderate caliber co-dominant vessel with no focally obstructive disease. There is intense spasm noted in the proximal vessel with catheter engagement that resolves with IC NTG.   Left Ventricular Angiogram: LVEF=65-70%. 3+ mitral regurgitation noted.   Impression: 1. Mild non-obstructive CAD 2. Normal LV systolic function 3. Severe mitral regurgitation leading to elevated filling pressures  Recommendations: Will ask CT surgery to evaluate for possible MV surgery. She will be kept as in an inpatient at [facility].    Complications: None; patient tolerated the procedure well.            CT ANGIOGRAPHY ABDOMEN AND PELVIS  TECHNIQUE: Multidetector CT imaging of the abdomen and pelvis was performed using the standard protocol during bolus administration of intravenous contrast. Multiplanar reconstructed images including MIPs were obtained and reviewed to evaluate the vascular anatomy.  CONTRAST: 80 cc Omnipaque 350  COMPARISON: 07/23/2011  FINDINGS: Arterial findings:  Aorta: Mild-to-moderate wall irregularity and calcification of the lower thoracic and abdominal aorta. Negative for aneurysm, dissection, or occlusive process. No retroperitoneal hemorrhage.  Celiac axis: Minor atherosclerotic change. Widely patent origin.  Superior mesenteric: Minor calcific atherosclerosis of the origin. SMA origin remains widely patent.  Ileocolic  trunk and jejunal branches are patent throughout the mesenteric.  Left renal: Widely patent.  Right renal: Minor atherosclerotic change. Widely patent.  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 IMA reconstitutes distally via SMA collaterals.  Left iliac: Moderate atherosclerotic change. No dissection, aneurysm or occlusion of the iliac system. Left common, internal and external iliac arteries remain patent. Left common iliac diameter is 6 mm.  Right iliac: Similar moderate right iliac atherosclerosis. Right common, internal and external iliac arteries are patent. Right common iliac diameter is 7 mm. No dissection, aneurysm or occlusive process.  Left femoral: Left common femoral, proximal profunda femoral, and proximal superficial femoral arteries demonstrated are patent with minor atherosclerotic change.  Right femoral: Visualize right common femoral, proximal profunda femoral, and proximal superficial femoral arteries demonstrated are patent with minor atherosclerosis.  Venous findings: Venous imaging not performed.  Review of the MIP images confirms the above findings.  Nonvascular findings: Lower chest: Minor dependent basilar atelectasis. Small posterior right diaphragmatic hernia containing only fat. No pleural or pericardial effusion. Normal heart size. No significant hiatal hernia.  Abdomen: Liver, biliary system, gallbladder, pancreas, spleen, and adrenal glands are within normal limits for arterial phase imaging.  Exam of the bowel is limited without oral contrast for a CTA protocol. Negative for bowel obstruction, dilatation, ileus, or free air. Moderate retained stool throughout the colon.  No abdominal free fluid, fluid collection, hemorrhage, abscess or hematoma.  Pelvis: No pelvic free fluid, fluid collection, hemorrhage, abscess, or hematoma. No inguinal abnormality  or hernia. Urinary bladder moderately distended. Uterus and adnexal normal in size.  Bones appear osteopenic. Minor lumbar degenerative changes.  IMPRESSION: Diffuse mild to moderate aortoiliac atherosclerosis without occlusive process, aneurysm or dissection. No significant vascular disease to impede endovascular assisted mitral valve replacement.   Electronically Signed  By: Jerilynn Mages. Shick M.D.  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 I have personally reviewed the patient's recent transthoracic and transesophageal echocardiograms and diagnostic cardiac catheterization. The patient has classical rheumatic features with severe thickening and restricted leaflet mobility involving both the anterior and posterior leaflets of the mitral valve with foreshortening of the subvalvular apparatus. Left ventricular size and systolic function remain normal. The patient does not have significant coronary artery disease. The patient's valvular heart disease is undoubtedly chronic, and the development of symptoms of congestive heart failure were exacerbated by the patient's recent severe illness with influenza A and community acquired pneumonia. In addition, the patient has long-standing tobacco abuse with COPD, although recent follow up PFT's revealed only mild airflow obstruction. She appears to be a relatively good candidate for minimally invasive approach for surgery.   At present the patient continues to do well on medical therapy for chronic diastolic congestive heart failure with only mild symptoms of exertional shortness of breath with more strenuous activity. She continues to abstain from any tobacco use. Options include continued close observation on medical therapy versus proceeding with minimally invasive mitral valve repair or replacement in the  near future. There is no question that she will ultimately require mitral valve surgery, but the likelihood that her valve could be successfully repaired with anticipation of excellent long-term result is muted by the presence of severe rheumatic disease.    Plan:  The patient was again counseled at length regarding the indications, risks and potential benefits of mitral valve repair or  replacement. The rationale for elective surgery has been explained, including a comparison between surgery and continued medical therapy with close follow-up. The likelihood of successful and durable valve repair has been discussed with particular reference to the findings of their recent echocardiogram. Based upon these findings and previous experience, I have quoted her a <25 percent likelihood of successful valve repair. In the event that her valve cannot be successfully repaired, we discussed the possibility of replacing the mitral valve using a mechanical prosthesis with the attendant need for long-term anticoagulation versus the alternative of replacing it using a bioprosthetic tissue valve with its potential for late structural valve deterioration and failure, depending upon the patient's longevity. The patient specifically requests that if the mitral valve must be replaced that it be done using a bioprosthetic tissue valve. She does not feel that she could reliably take Coumadin for the rest of her life. Under the circumstances, we might consider an attempt at valve repair if at all feasible, although there is still a high likelihood that her valve would need to be replaced.   We plan to proceed with minimally invasive mitral valve repair or replacement on Wednesday, 11/08/2014. The patient was counseled at length regarding the indications, risks and potential benefits of surgery. Alternative treatment strategies were discussed. They understand and accept all potential risks of surgery including but not  limited to risk of death, stroke or other neurologic complication, myocardial infarction, congestive heart failure, respiratory failure, renal failure, bleeding requiring transfusion and/or reexploration, arrhythmia, infection or other wound complications, pneumonia, pleural and/or pericardial effusion, pulmonary embolus, aortic dissection or other major vascular complication, or delayed complications related to valve repair or replacement including but not limited to structural valve deterioration and failure, thrombosis, embolization, endocarditis, or paravalvular leak. All of their questions have been answered. Alternative surgical approaches have been discussed including a comparison between conventional sternotomy and minimally-invasive techniques. The relative risks and benefits of each have been reviewed as they pertain to the patient's specific circumstances, and all of their questions have been addressed. Specific risks potentially related to the minimally-invasive approach were discussed at length, including but not limited to risk of conversion to full or partial sternotomy, aortic dissection or other major vascular complication, unilateral acute lung injury or pulmonary edema, phrenic nerve dysfunction or paralysis, rib fracture, chronic pain, lung hernia, or lymphocele. All of the patient's questions have been addressed. She has been given a prescription for amiodarone to begin 7 days prior to her surgery to decrease her risk of perioperative atrial dysrhythmias.    I spent in excess of 30 minutes during the conduct of this office consultation and >50% of this time involved direct face-to-face encounter with the patient for counseling and/or coordination of their care.   Valentina Gu. Roxy Manns, MD 10/26/2014 1:15 PM

## 2014-11-08 ENCOUNTER — Inpatient Hospital Stay (HOSPITAL_COMMUNITY)
Admission: RE | Admit: 2014-11-08 | Discharge: 2014-11-15 | DRG: 220 | Disposition: A | Discharge status: Home / Self Care | Payer: Medicare Other | Source: Ambulatory Visit | Attending: Thoracic Surgery (Cardiothoracic Vascular Surgery) | Admitting: Thoracic Surgery (Cardiothoracic Vascular Surgery)

## 2014-11-08 ENCOUNTER — Inpatient Hospital Stay (HOSPITAL_COMMUNITY): Payer: Medicare Other | Admitting: Anesthesiology

## 2014-11-08 ENCOUNTER — Inpatient Hospital Stay (HOSPITAL_COMMUNITY): Payer: Medicare Other

## 2014-11-08 ENCOUNTER — Encounter (HOSPITAL_COMMUNITY)
Admission: RE | Disposition: A | Discharge status: Home / Self Care | Payer: Medicare Other | Source: Ambulatory Visit | Attending: Thoracic Surgery (Cardiothoracic Vascular Surgery)

## 2014-11-08 ENCOUNTER — Encounter (HOSPITAL_COMMUNITY): Payer: Self-pay | Admitting: *Deleted

## 2014-11-08 DIAGNOSIS — M797 Fibromyalgia: Secondary | ICD-10-CM | POA: Diagnosis present

## 2014-11-08 DIAGNOSIS — I4891 Unspecified atrial fibrillation: Secondary | ICD-10-CM | POA: Diagnosis not present

## 2014-11-08 DIAGNOSIS — Z87891 Personal history of nicotine dependence: Secondary | ICD-10-CM

## 2014-11-08 DIAGNOSIS — J449 Chronic obstructive pulmonary disease, unspecified: Secondary | ICD-10-CM | POA: Diagnosis present

## 2014-11-08 DIAGNOSIS — Z953 Presence of xenogenic heart valve: Secondary | ICD-10-CM

## 2014-11-08 DIAGNOSIS — D62 Acute posthemorrhagic anemia: Secondary | ICD-10-CM | POA: Diagnosis not present

## 2014-11-08 DIAGNOSIS — I340 Nonrheumatic mitral (valve) insufficiency: Secondary | ICD-10-CM

## 2014-11-08 DIAGNOSIS — Z952 Presence of prosthetic heart valve: Secondary | ICD-10-CM

## 2014-11-08 DIAGNOSIS — Z9889 Other specified postprocedural states: Secondary | ICD-10-CM

## 2014-11-08 DIAGNOSIS — I4892 Unspecified atrial flutter: Secondary | ICD-10-CM | POA: Diagnosis not present

## 2014-11-08 DIAGNOSIS — E43 Unspecified severe protein-calorie malnutrition: Secondary | ICD-10-CM | POA: Diagnosis present

## 2014-11-08 DIAGNOSIS — K913 Postprocedural intestinal obstruction: Secondary | ICD-10-CM | POA: Diagnosis present

## 2014-11-08 DIAGNOSIS — Z9109 Other allergy status, other than to drugs and biological substances: Secondary | ICD-10-CM

## 2014-11-08 DIAGNOSIS — Z79899 Other long term (current) drug therapy: Secondary | ICD-10-CM | POA: Diagnosis not present

## 2014-11-08 DIAGNOSIS — I059 Rheumatic mitral valve disease, unspecified: Secondary | ICD-10-CM

## 2014-11-08 DIAGNOSIS — J9811 Atelectasis: Secondary | ICD-10-CM | POA: Diagnosis not present

## 2014-11-08 DIAGNOSIS — Z4682 Encounter for fitting and adjustment of non-vascular catheter: Secondary | ICD-10-CM

## 2014-11-08 DIAGNOSIS — K589 Irritable bowel syndrome without diarrhea: Secondary | ICD-10-CM | POA: Diagnosis present

## 2014-11-08 DIAGNOSIS — Z01812 Encounter for preprocedural laboratory examination: Secondary | ICD-10-CM | POA: Diagnosis not present

## 2014-11-08 DIAGNOSIS — I2510 Atherosclerotic heart disease of native coronary artery without angina pectoris: Secondary | ICD-10-CM | POA: Diagnosis present

## 2014-11-08 DIAGNOSIS — G894 Chronic pain syndrome: Secondary | ICD-10-CM | POA: Diagnosis present

## 2014-11-08 DIAGNOSIS — Z885 Allergy status to narcotic agent status: Secondary | ICD-10-CM | POA: Diagnosis not present

## 2014-11-08 DIAGNOSIS — I052 Rheumatic mitral stenosis with insufficiency: Principal | ICD-10-CM | POA: Diagnosis present

## 2014-11-08 DIAGNOSIS — Z01810 Encounter for preprocedural cardiovascular examination: Secondary | ICD-10-CM

## 2014-11-08 DIAGNOSIS — K219 Gastro-esophageal reflux disease without esophagitis: Secondary | ICD-10-CM | POA: Diagnosis present

## 2014-11-08 DIAGNOSIS — I5032 Chronic diastolic (congestive) heart failure: Secondary | ICD-10-CM | POA: Diagnosis present

## 2014-11-08 HISTORY — DX: Presence of xenogenic heart valve: Z95.3

## 2014-11-08 HISTORY — PX: MITRAL VALVE REPAIR: SHX2039

## 2014-11-08 HISTORY — PX: TEE WITHOUT CARDIOVERSION: SHX5443

## 2014-11-08 LAB — GLUCOSE, CAPILLARY
GLUCOSE-CAPILLARY: 36 mg/dL — AB (ref 65–99)
GLUCOSE-CAPILLARY: 104 mg/dL — AB (ref 65–99)
GLUCOSE-CAPILLARY: 94 mg/dL (ref 65–99)
GLUCOSE-CAPILLARY: 120 mg/dL — AB (ref 65–99)
GLUCOSE-CAPILLARY: 128 mg/dL — AB (ref 65–99)
GLUCOSE-CAPILLARY: 121 mg/dL — AB (ref 65–99)
GLUCOSE-CAPILLARY: 131 mg/dL — AB (ref 65–99)
Glucose-Capillary: 180 mg/dL — ABNORMAL HIGH (ref 65–99)
Glucose-Capillary: 58 mg/dL — ABNORMAL LOW (ref 65–99)
Glucose-Capillary: 130 mg/dL — ABNORMAL HIGH (ref 65–99)
Glucose-Capillary: 156 mg/dL — ABNORMAL HIGH (ref 65–99)

## 2014-11-08 LAB — POCT I-STAT, CHEM 8
BUN: 20 mg/dL (ref 6–20)
BUN: 17 mg/dL (ref 6–20)
BUN: 14 mg/dL (ref 6–20)
BUN: 16 mg/dL (ref 6–20)
BUN: 15 mg/dL (ref 6–20)
CALCIUM ION: 1.29 mmol/L — AB (ref 1.12–1.23)
CALCIUM ION: 1.02 mmol/L — AB (ref 1.12–1.23)
CALCIUM ION: 1.03 mmol/L — AB (ref 1.12–1.23)
CHLORIDE: 101 mmol/L (ref 101–111)
CHLORIDE: 100 mmol/L — AB (ref 101–111)
CHLORIDE: 101 mmol/L (ref 101–111)
CREATININE: 0.6 mg/dL (ref 0.44–1.00)
CREATININE: 0.5 mg/dL (ref 0.44–1.00)
CREATININE: 0.5 mg/dL (ref 0.44–1.00)
Calcium, Ion: 1.26 mmol/L — ABNORMAL HIGH (ref 1.12–1.23)
Calcium, Ion: 1.12 mmol/L (ref 1.12–1.23)
Chloride: 104 mmol/L (ref 101–111)
Chloride: 99 mmol/L — ABNORMAL LOW (ref 101–111)
Creatinine, Ser: 0.4 mg/dL — ABNORMAL LOW (ref 0.44–1.00)
Creatinine, Ser: 0.6 mg/dL (ref 0.44–1.00)
GLUCOSE: 125 mg/dL — AB (ref 65–99)
GLUCOSE: 144 mg/dL — AB (ref 65–99)
Glucose, Bld: 142 mg/dL — ABNORMAL HIGH (ref 65–99)
Glucose, Bld: 178 mg/dL — ABNORMAL HIGH (ref 65–99)
Glucose, Bld: 157 mg/dL — ABNORMAL HIGH (ref 65–99)
HCT: 20 % — ABNORMAL LOW (ref 36.0–46.0)
HCT: 26 % — ABNORMAL LOW (ref 36.0–46.0)
HEMATOCRIT: 35 % — AB (ref 36.0–46.0)
HEMATOCRIT: 35 % — AB (ref 36.0–46.0)
HEMATOCRIT: 26 % — AB (ref 36.0–46.0)
HEMOGLOBIN: 11.9 g/dL — AB (ref 12.0–15.0)
HEMOGLOBIN: 8.8 g/dL — AB (ref 12.0–15.0)
Hemoglobin: 11.9 g/dL — ABNORMAL LOW (ref 12.0–15.0)
Hemoglobin: 6.8 g/dL — CL (ref 12.0–15.0)
Hemoglobin: 8.8 g/dL — ABNORMAL LOW (ref 12.0–15.0)
POTASSIUM: 3.6 mmol/L (ref 3.5–5.1)
POTASSIUM: 3.6 mmol/L (ref 3.5–5.1)
Potassium: 3.8 mmol/L (ref 3.5–5.1)
Potassium: 3.4 mmol/L — ABNORMAL LOW (ref 3.5–5.1)
Potassium: 4.9 mmol/L (ref 3.5–5.1)
SODIUM: 137 mmol/L (ref 135–145)
SODIUM: 139 mmol/L (ref 135–145)
Sodium: 139 mmol/L (ref 135–145)
Sodium: 131 mmol/L — ABNORMAL LOW (ref 135–145)
Sodium: 137 mmol/L (ref 135–145)
TCO2: 27 mmol/L (ref 0–100)
TCO2: 25 mmol/L (ref 0–100)
TCO2: 24 mmol/L (ref 0–100)
TCO2: 24 mmol/L (ref 0–100)
TCO2: 21 mmol/L (ref 0–100)

## 2014-11-08 LAB — CBC
HCT: 35.2 % — ABNORMAL LOW (ref 36.0–46.0)
HCT: 33.6 % — ABNORMAL LOW (ref 36.0–46.0)
Hemoglobin: 11.6 g/dL — ABNORMAL LOW (ref 12.0–15.0)
Hemoglobin: 11.1 g/dL — ABNORMAL LOW (ref 12.0–15.0)
MCH: 29.1 pg (ref 26.0–34.0)
MCH: 29.4 pg (ref 26.0–34.0)
MCHC: 33 g/dL (ref 30.0–36.0)
MCHC: 33 g/dL (ref 30.0–36.0)
MCV: 88.4 fL (ref 78.0–100.0)
MCV: 88.9 fL (ref 78.0–100.0)
PLATELETS: 176 10*3/uL (ref 150–400)
Platelets: 163 10*3/uL (ref 150–400)
RBC: 3.98 MIL/uL (ref 3.87–5.11)
RBC: 3.78 MIL/uL — ABNORMAL LOW (ref 3.87–5.11)
RDW: 13.6 % (ref 11.5–15.5)
RDW: 13.8 % (ref 11.5–15.5)
WBC: 12.5 10*3/uL — ABNORMAL HIGH (ref 4.0–10.5)
WBC: 14.9 10*3/uL — AB (ref 4.0–10.5)

## 2014-11-08 LAB — APTT: APTT: 34 s (ref 24–37)

## 2014-11-08 LAB — POCT I-STAT 3, ART BLOOD GAS (G3+)
ACID-BASE DEFICIT: 2 mmol/L (ref 0.0–2.0)
ACID-BASE DEFICIT: 4 mmol/L — AB (ref 0.0–2.0)
Acid-Base Excess: 2 mmol/L (ref 0.0–2.0)
BICARBONATE: 22.8 meq/L (ref 20.0–24.0)
Bicarbonate: 26.4 mEq/L — ABNORMAL HIGH (ref 20.0–24.0)
Bicarbonate: 24.9 mEq/L — ABNORMAL HIGH (ref 20.0–24.0)
O2 SAT: 94 %
O2 Saturation: 100 %
O2 Saturation: 96 %
PCO2 ART: 49.4 mmHg — AB (ref 35.0–45.0)
PCO2 ART: 44.6 mmHg (ref 35.0–45.0)
PH ART: 7.304 — AB (ref 7.350–7.450)
PH ART: 7.313 — AB (ref 7.350–7.450)
TCO2: 28 mmol/L (ref 0–100)
TCO2: 26 mmol/L (ref 0–100)
TCO2: 24 mmol/L (ref 0–100)
pCO2 arterial: 38.9 mmHg (ref 35.0–45.0)
pH, Arterial: 7.439 (ref 7.350–7.450)
pO2, Arterial: 378 mmHg — ABNORMAL HIGH (ref 80.0–100.0)
pO2, Arterial: 76 mmHg — ABNORMAL LOW (ref 80.0–100.0)
pO2, Arterial: 86 mmHg (ref 80.0–100.0)

## 2014-11-08 LAB — CREATININE, SERUM
Creatinine, Ser: 0.6 mg/dL (ref 0.44–1.00)
GFR calc non Af Amer: >60 mL/min (ref >60)

## 2014-11-08 LAB — MAGNESIUM: Magnesium: 3.2 mg/dL — ABNORMAL HIGH (ref 1.7–2.4)

## 2014-11-08 LAB — POCT I-STAT 4, (NA,K, GLUC, HGB,HCT)
Glucose, Bld: 46 mg/dL — ABNORMAL LOW (ref 65–99)
HCT: 33 % — ABNORMAL LOW (ref 36.0–46.0)
Hemoglobin: 11.2 g/dL — ABNORMAL LOW (ref 12.0–15.0)
Potassium: 3.5 mmol/L (ref 3.5–5.1)
Sodium: 140 mmol/L (ref 135–145)

## 2014-11-08 LAB — PLATELET COUNT: Platelets: 168 10*3/uL (ref 150–400)

## 2014-11-08 LAB — PREPARE RBC (CROSSMATCH)

## 2014-11-08 LAB — PROTIME-INR
INR: 1.24 (ref 0.00–1.49)
Prothrombin Time: 15.8 seconds — ABNORMAL HIGH (ref 11.6–15.2)

## 2014-11-08 LAB — HEMOGLOBIN AND HEMATOCRIT, BLOOD
HEMATOCRIT: 21.5 % — AB (ref 36.0–46.0)
Hemoglobin: 7.2 g/dL — ABNORMAL LOW (ref 12.0–15.0)

## 2014-11-08 SURGERY — REPAIR, MITRAL VALVE, MINIMALLY INVASIVE
Anesthesia: General | Site: Chest | Laterality: Right

## 2014-11-08 MED ORDER — METOPROLOL TARTRATE 12.5 MG HALF TABLET
12.5000 mg | ORAL_TABLET | Freq: Two times a day (BID) | ORAL | Status: DC
  Administered 2014-11-09 – 2014-11-10 (×2): 12.5 mg via ORAL
  Filled 2014-11-08 (×7): qty 1

## 2014-11-08 MED ORDER — DEXTROSE 50 % IV SOLN
INTRAVENOUS | Status: AC
  Filled 2014-11-08: qty 50

## 2014-11-08 MED ORDER — PANTOPRAZOLE SODIUM 40 MG PO TBEC
40.0000 mg | DELAYED_RELEASE_TABLET | Freq: Every day | ORAL | Status: DC
  Administered 2014-11-10 – 2014-11-12 (×3): 40 mg via ORAL
  Filled 2014-11-08 (×3): qty 1

## 2014-11-08 MED ORDER — LACTATED RINGERS IV SOLN: INTRAVENOUS | Status: DC

## 2014-11-08 MED ORDER — CHLORHEXIDINE GLUCONATE 4 % EX LIQD
30.0000 mL | CUTANEOUS | Status: DC
Start: 2014-11-08 — End: 2014-11-08

## 2014-11-08 MED ORDER — EPHEDRINE SULFATE 50 MG/ML IJ SOLN
INTRAMUSCULAR | Status: AC
  Filled 2014-11-08: qty 1

## 2014-11-08 MED ORDER — DEXTROSE 5 % IV SOLN
1.5000 g | Freq: Two times a day (BID) | INTRAVENOUS | Status: AC
  Administered 2014-11-08 – 2014-11-10 (×4): 1.5 g via INTRAVENOUS
  Filled 2014-11-08 (×4): qty 1.5

## 2014-11-08 MED ORDER — OXYCODONE HCL 5 MG PO TABS
5.0000 mg | ORAL_TABLET | ORAL | Status: DC | PRN
  Administered 2014-11-09 (×4): 5 mg via ORAL
  Administered 2014-11-10 (×2): 10 mg via ORAL
  Administered 2014-11-12 – 2014-11-14 (×6): 5 mg via ORAL
  Filled 2014-11-08: qty 1
  Filled 2014-11-08: qty 2
  Filled 2014-11-08 (×3): qty 1
  Filled 2014-11-08: qty 2
  Filled 2014-11-08 (×6): qty 1

## 2014-11-08 MED ORDER — MIDAZOLAM HCL 10 MG/2ML IJ SOLN
INTRAMUSCULAR | Status: AC
  Filled 2014-11-08: qty 2

## 2014-11-08 MED ORDER — SUCCINYLCHOLINE CHLORIDE 20 MG/ML IJ SOLN
INTRAMUSCULAR | Status: AC
  Filled 2014-11-08: qty 1

## 2014-11-08 MED ORDER — ARTIFICIAL TEARS OP OINT
TOPICAL_OINTMENT | OPHTHALMIC | Status: AC
  Filled 2014-11-08: qty 7

## 2014-11-08 MED ORDER — LACTATED RINGERS IV SOLN
INTRAVENOUS | Status: DC | PRN
  Administered 2014-11-08: 08:00:00 via INTRAVENOUS

## 2014-11-08 MED ORDER — ROCURONIUM BROMIDE 50 MG/5ML IV SOLN
INTRAVENOUS | Status: AC
  Filled 2014-11-08: qty 1

## 2014-11-08 MED ORDER — HEPARIN SODIUM (PORCINE) 1000 UNIT/ML IJ SOLN
INTRAMUSCULAR | Status: AC
Start: 2014-11-08 — End: 2014-11-08
  Filled 2014-11-08: qty 1

## 2014-11-08 MED ORDER — ALBUMIN HUMAN 5 % IV SOLN
250.0000 mL | INTRAVENOUS | Status: AC | PRN
  Administered 2014-11-08 – 2014-11-09 (×4): 250 mL via INTRAVENOUS
  Filled 2014-11-08 (×2): qty 250

## 2014-11-08 MED ORDER — TRAMADOL HCL 50 MG PO TABS
50.0000 mg | ORAL_TABLET | ORAL | Status: DC | PRN
  Administered 2014-11-09 (×2): 50 mg via ORAL
  Administered 2014-11-09 – 2014-11-11 (×4): 100 mg via ORAL
  Administered 2014-11-11 – 2014-11-12 (×2): 50 mg via ORAL
  Filled 2014-11-08: qty 2
  Filled 2014-11-08: qty 1
  Filled 2014-11-08 (×3): qty 2
  Filled 2014-11-08 (×4): qty 1

## 2014-11-08 MED ORDER — METOPROLOL TARTRATE 25 MG/10 ML ORAL SUSPENSION
12.5000 mg | Freq: Two times a day (BID) | ORAL | Status: DC
  Filled 2014-11-08 (×3): qty 5

## 2014-11-08 MED ORDER — ACETAMINOPHEN 500 MG PO TABS
1000.0000 mg | ORAL_TABLET | Freq: Four times a day (QID) | ORAL | Status: AC
  Administered 2014-11-09 – 2014-11-13 (×19): 1000 mg via ORAL
  Filled 2014-11-08 (×20): qty 2

## 2014-11-08 MED ORDER — PROTAMINE SULFATE 10 MG/ML IV SOLN
INTRAVENOUS | Status: AC
  Filled 2014-11-08: qty 5

## 2014-11-08 MED ORDER — POTASSIUM CHLORIDE 10 MEQ/50ML IV SOLN
10.0000 meq | INTRAVENOUS | Status: AC
  Administered 2014-11-08 (×3): 10 meq via INTRAVENOUS

## 2014-11-08 MED ORDER — STERILE WATER FOR INJECTION IJ SOLN
INTRAMUSCULAR | Status: AC
  Filled 2014-11-08: qty 10

## 2014-11-08 MED ORDER — NITROGLYCERIN IN D5W 200-5 MCG/ML-% IV SOLN: 0.0000 ug/min | INTRAVENOUS | Status: DC

## 2014-11-08 MED ORDER — LIDOCAINE HCL (CARDIAC) 20 MG/ML IV SOLN
INTRAVENOUS | Status: AC
  Filled 2014-11-08: qty 5

## 2014-11-08 MED ORDER — FENTANYL CITRATE (PF) 250 MCG/5ML IJ SOLN
INTRAMUSCULAR | Status: AC
  Filled 2014-11-08: qty 5

## 2014-11-08 MED ORDER — DEXMEDETOMIDINE HCL IN NACL 200 MCG/50ML IV SOLN: 0.0000 ug/kg/h | INTRAVENOUS | Status: DC

## 2014-11-08 MED ORDER — ACETAMINOPHEN 160 MG/5ML PO SOLN: 1000.0000 mg | Freq: Four times a day (QID) | ORAL | Status: DC

## 2014-11-08 MED ORDER — KETOROLAC TROMETHAMINE 30 MG/ML IJ SOLN: 30.0000 mg | Freq: Once | INTRAMUSCULAR | Status: DC | PRN

## 2014-11-08 MED ORDER — ASPIRIN EC 325 MG PO TBEC
325.0000 mg | DELAYED_RELEASE_TABLET | Freq: Every day | ORAL | Status: DC
  Administered 2014-11-09 – 2014-11-14 (×6): 325 mg via ORAL
  Filled 2014-11-08 (×6): qty 1

## 2014-11-08 MED ORDER — MAGNESIUM SULFATE 4 GM/100ML IV SOLN
4.0000 g | Freq: Once | INTRAVENOUS | Status: AC
  Administered 2014-11-08: 4 g via INTRAVENOUS
  Filled 2014-11-08: qty 100

## 2014-11-08 MED ORDER — CHLORHEXIDINE GLUCONATE 0.12 % MT SOLN
15.0000 mL | Freq: Two times a day (BID) | OROMUCOSAL | Status: DC
  Administered 2014-11-09: 15 mL via OROMUCOSAL
  Filled 2014-11-08: qty 15

## 2014-11-08 MED ORDER — CETYLPYRIDINIUM CHLORIDE 0.05 % MT LIQD: 7.0000 mL | Freq: Two times a day (BID) | OROMUCOSAL | Status: DC

## 2014-11-08 MED ORDER — LACTATED RINGERS IV SOLN: 500.0000 mL | Freq: Once | INTRAVENOUS | Status: AC | PRN

## 2014-11-08 MED ORDER — DOCUSATE SODIUM 100 MG PO CAPS
200.0000 mg | ORAL_CAPSULE | Freq: Every day | ORAL | Status: DC
Start: 2014-11-09 — End: 2014-11-15
  Administered 2014-11-10 – 2014-11-15 (×6): 200 mg via ORAL
  Filled 2014-11-08 (×7): qty 2

## 2014-11-08 MED ORDER — ACETAMINOPHEN 160 MG/5ML PO SOLN
650.0000 mg | Freq: Once | ORAL | Status: AC
Start: 2014-11-08 — End: 2014-11-08

## 2014-11-08 MED ORDER — SODIUM CHLORIDE 0.45 % IV SOLN
INTRAVENOUS | Status: DC | PRN
  Administered 2014-11-08: 20 mL/h via INTRAVENOUS

## 2014-11-08 MED ORDER — PHENYLEPHRINE HCL 10 MG/ML IJ SOLN
10.0000 mg | INTRAVENOUS | Status: DC | PRN
  Administered 2014-11-08: 50 ug/min via INTRAVENOUS

## 2014-11-08 MED ORDER — SALINE SPRAY 0.65 % NA SOLN
1.0000 | NASAL | Status: DC | PRN
  Filled 2014-11-08 (×2): qty 44

## 2014-11-08 MED ORDER — SODIUM CHLORIDE 0.9 % IV SOLN: 250.0000 mL | INTRAVENOUS | Status: DC

## 2014-11-08 MED ORDER — VANCOMYCIN HCL IN DEXTROSE 1-5 GM/200ML-% IV SOLN
1000.0000 mg | Freq: Once | INTRAVENOUS | Status: AC
  Administered 2014-11-08: 1000 mg via INTRAVENOUS
  Filled 2014-11-08: qty 200

## 2014-11-08 MED ORDER — METOPROLOL TARTRATE 1 MG/ML IV SOLN: 2.5000 mg | INTRAVENOUS | Status: DC | PRN

## 2014-11-08 MED ORDER — BISACODYL 10 MG RE SUPP: 10.0000 mg | Freq: Every day | RECTAL | Status: DC

## 2014-11-08 MED ORDER — PROPOFOL 10 MG/ML IV BOLUS
INTRAVENOUS | Status: AC
  Filled 2014-11-08: qty 20

## 2014-11-08 MED ORDER — SODIUM CHLORIDE 0.9 % IV SOLN
INTRAVENOUS | Status: DC
  Administered 2014-11-08: 22:00:00 via INTRAVENOUS
  Filled 2014-11-08 (×2): qty 2.5

## 2014-11-08 MED ORDER — SODIUM CHLORIDE 0.9 % IJ SOLN
3.0000 mL | Freq: Two times a day (BID) | INTRAMUSCULAR | Status: DC
  Administered 2014-11-09 (×2): 3 mL via INTRAVENOUS

## 2014-11-08 MED ORDER — VECURONIUM BROMIDE 10 MG IV SOLR
INTRAVENOUS | Status: DC | PRN
  Administered 2014-11-08: 1 mg via INTRAVENOUS
  Administered 2014-11-08: 2 mg via INTRAVENOUS
  Administered 2014-11-08: 3 mg via INTRAVENOUS
  Administered 2014-11-08: 1 mg via INTRAVENOUS
  Administered 2014-11-08: 2 mg via INTRAVENOUS
  Administered 2014-11-08: 1 mg via INTRAVENOUS

## 2014-11-08 MED ORDER — ARTIFICIAL TEARS OP OINT
TOPICAL_OINTMENT | OPHTHALMIC | Status: DC | PRN
  Administered 2014-11-08: 1 via OPHTHALMIC

## 2014-11-08 MED ORDER — FAMOTIDINE IN NACL 20-0.9 MG/50ML-% IV SOLN
20.0000 mg | Freq: Two times a day (BID) | INTRAVENOUS | Status: DC
  Administered 2014-11-08: 20 mg via INTRAVENOUS

## 2014-11-08 MED ORDER — SODIUM CHLORIDE 0.9 % IJ SOLN: 3.0000 mL | INTRAMUSCULAR | Status: DC | PRN

## 2014-11-08 MED ORDER — CHLORHEXIDINE GLUCONATE 0.12 % MT SOLN: 15.0000 mL | Freq: Once | OROMUCOSAL | Status: DC

## 2014-11-08 MED ORDER — DEXTROSE 5 % IV SOLN
0.0000 ug/min | INTRAVENOUS | Status: DC
  Filled 2014-11-08: qty 2

## 2014-11-08 MED ORDER — FENTANYL CITRATE (PF) 100 MCG/2ML IJ SOLN
INTRAMUSCULAR | Status: DC | PRN
  Administered 2014-11-08 (×10): 50 ug via INTRAVENOUS
  Administered 2014-11-08: 200 ug via INTRAVENOUS
  Administered 2014-11-08 (×2): 50 ug via INTRAVENOUS

## 2014-11-08 MED ORDER — HEPARIN SODIUM (PORCINE) 1000 UNIT/ML IJ SOLN
INTRAMUSCULAR | Status: DC | PRN
  Administered 2014-11-08: 16000 [IU] via INTRAVENOUS

## 2014-11-08 MED ORDER — PROTAMINE SULFATE 10 MG/ML IV SOLN
INTRAVENOUS | Status: DC | PRN
  Administered 2014-11-08 (×2): 25 mg via INTRAVENOUS
  Administered 2014-11-08: 15 mg via INTRAVENOUS
  Administered 2014-11-08: 10 mg via INTRAVENOUS
  Administered 2014-11-08 (×3): 25 mg via INTRAVENOUS

## 2014-11-08 MED ORDER — ONDANSETRON HCL 4 MG/2ML IJ SOLN
4.0000 mg | Freq: Four times a day (QID) | INTRAMUSCULAR | Status: DC | PRN
  Administered 2014-11-09: 4 mg via INTRAVENOUS
  Filled 2014-11-08: qty 2

## 2014-11-08 MED ORDER — SODIUM CHLORIDE 0.9 % IV SOLN
INTRAVENOUS | Status: DC
  Administered 2014-11-08: 100 mL/h via INTRAVENOUS

## 2014-11-08 MED ORDER — ACETAMINOPHEN 650 MG RE SUPP
650.0000 mg | Freq: Once | RECTAL | Status: AC
  Administered 2014-11-08: 650 mg via RECTAL

## 2014-11-08 MED ORDER — MORPHINE SULFATE 2 MG/ML IJ SOLN: 2.0000 mg | INTRAMUSCULAR | Status: DC | PRN

## 2014-11-08 MED ORDER — MIDAZOLAM HCL 2 MG/2ML IJ SOLN
INTRAMUSCULAR | Status: AC
  Filled 2014-11-08: qty 2

## 2014-11-08 MED ORDER — NEOSTIGMINE METHYLSULFATE 10 MG/10ML IV SOLN
INTRAVENOUS | Status: AC
  Filled 2014-11-08: qty 1

## 2014-11-08 MED ORDER — MIDAZOLAM HCL 5 MG/5ML IJ SOLN
INTRAMUSCULAR | Status: DC | PRN
  Administered 2014-11-08 (×3): 1 mg via INTRAVENOUS
  Administered 2014-11-08 (×2): 2 mg via INTRAVENOUS
  Administered 2014-11-08: 3 mg via INTRAVENOUS
  Administered 2014-11-08 (×2): 1 mg via INTRAVENOUS

## 2014-11-08 MED ORDER — DEXTROSE 50 % IV SOLN
17.0000 mL | Freq: Once | INTRAVENOUS | Status: AC
  Administered 2014-11-08: 17 mL via INTRAVENOUS

## 2014-11-08 MED ORDER — DEXTROSE 50 % IV SOLN
50.0000 mL | Freq: Once | INTRAVENOUS | Status: AC
  Administered 2014-11-08: 50 mL via INTRAVENOUS

## 2014-11-08 MED ORDER — MORPHINE SULFATE 2 MG/ML IJ SOLN: 1.0000 mg | INTRAMUSCULAR | Status: DC | PRN

## 2014-11-08 MED ORDER — ROCURONIUM BROMIDE 100 MG/10ML IV SOLN
INTRAVENOUS | Status: DC | PRN
  Administered 2014-11-08: 50 mg via INTRAVENOUS

## 2014-11-08 MED ORDER — SODIUM CHLORIDE 0.9 % IV SOLN: INTRAVENOUS | Status: DC

## 2014-11-08 MED ORDER — VECURONIUM BROMIDE 10 MG IV SOLR
INTRAVENOUS | Status: AC
  Filled 2014-11-08: qty 10

## 2014-11-08 MED ORDER — BISACODYL 5 MG PO TBEC
10.0000 mg | DELAYED_RELEASE_TABLET | Freq: Every day | ORAL | Status: DC
  Administered 2014-11-10 – 2014-11-15 (×4): 10 mg via ORAL
  Filled 2014-11-08 (×4): qty 2

## 2014-11-08 MED ORDER — INSULIN REGULAR BOLUS VIA INFUSION
0.0000 [IU] | Freq: Three times a day (TID) | INTRAVENOUS | Status: DC
  Filled 2014-11-08: qty 10

## 2014-11-08 MED ORDER — SODIUM CHLORIDE 0.9 % IJ SOLN
INTRAMUSCULAR | Status: AC
  Filled 2014-11-08: qty 30

## 2014-11-08 MED ORDER — POTASSIUM CHLORIDE 10 MEQ/50ML IV SOLN
10.0000 meq | Freq: Once | INTRAVENOUS | Status: AC
Start: 2014-11-08 — End: 2014-11-08
  Administered 2014-11-08: 10 meq via INTRAVENOUS

## 2014-11-08 MED ORDER — MIDAZOLAM HCL 2 MG/2ML IJ SOLN: 2.0000 mg | INTRAMUSCULAR | Status: DC | PRN

## 2014-11-08 MED ORDER — 0.9 % SODIUM CHLORIDE (POUR BTL) OPTIME
TOPICAL | Status: DC | PRN
  Administered 2014-11-08: 5000 mL

## 2014-11-08 MED ORDER — PROPOFOL 10 MG/ML IV BOLUS
INTRAVENOUS | Status: DC | PRN
Start: 2014-11-08 — End: 2014-11-08
  Administered 2014-11-08: 60 mg via INTRAVENOUS

## 2014-11-08 MED ORDER — ASPIRIN 81 MG PO CHEW: 324.0000 mg | CHEWABLE_TABLET | Freq: Every day | ORAL | Status: DC

## 2014-11-08 MED ORDER — PHENYLEPHRINE 40 MCG/ML (10ML) SYRINGE FOR IV PUSH (FOR BLOOD PRESSURE SUPPORT)
PREFILLED_SYRINGE | INTRAVENOUS | Status: AC
  Filled 2014-11-08: qty 10

## 2014-11-08 MED FILL — Sodium Bicarbonate IV Soln 8.4%: INTRAVENOUS | Qty: 50 | Status: AC

## 2014-11-08 MED FILL — Mannitol IV Soln 20%: INTRAVENOUS | Qty: 500 | Status: AC

## 2014-11-08 MED FILL — Sodium Chloride IV Soln 0.9%: INTRAVENOUS | Qty: 2000 | Status: AC

## 2014-11-08 MED FILL — Electrolyte-R (PH 7.4) Solution: INTRAVENOUS | Qty: 3000 | Status: AC

## 2014-11-08 MED FILL — Heparin Sodium (Porcine) Inj 1000 Unit/ML: INTRAMUSCULAR | Qty: 60 | Status: AC

## 2014-11-08 MED FILL — Lidocaine HCl IV Inj 20 MG/ML: INTRAVENOUS | Qty: 5 | Status: AC

## 2014-11-08 SURGICAL SUPPLY — 96 items
ADAPTER CARDIO PERF ANTE/RETRO (ADAPTER) ×3 IMPLANT
ADH SKN CLS APL DERMABOND .7 (GAUZE/BANDAGES/DRESSINGS) ×4
ADPR PRFSN 84XANTGRD RTRGD (ADAPTER) ×2
BAG DECANTER FOR FLEXI CONT (MISCELLANEOUS) ×6 IMPLANT
BLADE STERNUM SYSTEM 6 (BLADE) ×1 IMPLANT
BLADE SURG 11 STRL SS (BLADE) ×3 IMPLANT
CANISTER SUCTION 2500CC (MISCELLANEOUS) ×6 IMPLANT
CANNULA FEM VENOUS REMOTE 22FR (CANNULA) ×1 IMPLANT
CANNULA FEMORAL ART 14 SM (MISCELLANEOUS) ×3 IMPLANT
CANNULA GUNDRY RCSP 15FR (MISCELLANEOUS) ×3 IMPLANT
CANNULA OPTISITE PERFUSION 16F (CANNULA) ×1 IMPLANT
CANNULA OPTISITE PERFUSION 18F (CANNULA) IMPLANT
CANNULA SUMP PERICARDIAL (CANNULA) ×6 IMPLANT
CATH KIT ON Q 5IN SLV (PAIN MANAGEMENT) IMPLANT
CONN ST 1/4X3/8  BEN (MISCELLANEOUS) ×2
CONN ST 1/4X3/8 BEN (MISCELLANEOUS) ×4 IMPLANT
CONNECTOR 1/2X3/8X1/2 3 WAY (MISCELLANEOUS) ×1
CONNECTOR 1/2X3/8X1/2 3WAY (MISCELLANEOUS) ×2 IMPLANT
CONT SPEC STER OR (MISCELLANEOUS) ×3 IMPLANT
COVER BACK TABLE 24X17X13 BIG (DRAPES) ×3 IMPLANT
CRADLE DONUT ADULT HEAD (MISCELLANEOUS) ×3 IMPLANT
DERMABOND ADVANCED (GAUZE/BANDAGES/DRESSINGS) ×2
DERMABOND ADVANCED .7 DNX12 (GAUZE/BANDAGES/DRESSINGS) ×4 IMPLANT
DEVICE PMI PUNCTURE CLOSURE (MISCELLANEOUS) ×3 IMPLANT
DEVICE SUT CK QUICK LOAD MINI (Prosthesis & Implant Heart) ×1 IMPLANT
DEVICE TROCAR PUNCTURE CLOSURE (ENDOMECHANICALS) ×3 IMPLANT
DRAIN CHANNEL 28F RND 3/8 FF (WOUND CARE) ×6 IMPLANT
DRAPE BILATERAL SPLIT (DRAPES) ×3 IMPLANT
DRAPE C-ARM 42X72 X-RAY (DRAPES) ×3 IMPLANT
DRAPE CV SPLIT W-CLR ANES SCRN (DRAPES) ×3 IMPLANT
DRAPE INCISE IOBAN 66X45 STRL (DRAPES) ×9 IMPLANT
DRAPE PROXIMA HALF (DRAPES) ×1 IMPLANT
DRAPE SLUSH/WARMER DISC (DRAPES) ×3 IMPLANT
DRSG COVADERM 4X8 (GAUZE/BANDAGES/DRESSINGS) ×3 IMPLANT
ELECT BLADE 6.5 EXT (BLADE) ×3 IMPLANT
ELECT REM PT RETURN 9FT ADLT (ELECTROSURGICAL) ×6
ELECTRODE REM PT RTRN 9FT ADLT (ELECTROSURGICAL) ×4 IMPLANT
FEMORAL VENOUS CANN RAP (CANNULA) IMPLANT
GLOVE BIO SURGEON STRL SZ 6.5 (GLOVE) ×7 IMPLANT
GLOVE ORTHO TXT STRL SZ7.5 (GLOVE) ×9 IMPLANT
GOWN STRL REUS W/ TWL LRG LVL3 (GOWN DISPOSABLE) ×8 IMPLANT
GOWN STRL REUS W/TWL LRG LVL3 (GOWN DISPOSABLE) ×12
IV NS 1000ML (IV SOLUTION) ×3
IV NS 1000ML BAXH (IV SOLUTION) IMPLANT
IV NS IRRIG 3000ML ARTHROMATIC (IV SOLUTION) ×1 IMPLANT
KIT BASIN OR (CUSTOM PROCEDURE TRAY) ×3 IMPLANT
KIT DILATOR VASC 18G NDL (KITS) ×3 IMPLANT
KIT DRAINAGE VACCUM ASSIST (KITS) ×1 IMPLANT
KIT ROOM TURNOVER OR (KITS) ×3 IMPLANT
KIT SUCTION CATH 14FR (SUCTIONS) ×3 IMPLANT
KIT SUT CK MINI COMBO 4X17 (Prosthesis & Implant Heart) ×1 IMPLANT
LEAD PACING MYOCARDI (MISCELLANEOUS) ×3 IMPLANT
LINE VENT (MISCELLANEOUS) ×1 IMPLANT
NDL AORTIC ROOT 14G 7F (CATHETERS) ×2 IMPLANT
NEEDLE AORTIC ROOT 14G 7F (CATHETERS) ×3 IMPLANT
NS IRRIG 1000ML POUR BTL (IV SOLUTION) ×15 IMPLANT
PACK OPEN HEART (CUSTOM PROCEDURE TRAY) ×3 IMPLANT
PAD ARMBOARD 7.5X6 YLW CONV (MISCELLANEOUS) ×6 IMPLANT
PAD ELECT DEFIB RADIOL ZOLL (MISCELLANEOUS) ×3 IMPLANT
PATCH CORMATRIX 4CMX7CM (Prosthesis & Implant Heart) ×1 IMPLANT
RETRACTOR TRL SOFT TISSUE LG (INSTRUMENTS) IMPLANT
RETRACTOR TRM SOFT TISSUE 7.5 (INSTRUMENTS) IMPLANT
SET CANNULATION TOURNIQUET (MISCELLANEOUS) ×3 IMPLANT
SET CARDIOPLEGIA MPS 5001102 (MISCELLANEOUS) ×1 IMPLANT
SET IRRIG TUBING LAPAROSCOPIC (IRRIGATION / IRRIGATOR) ×3 IMPLANT
SOLUTION ANTI FOG 6CC (MISCELLANEOUS) ×3 IMPLANT
SPONGE GAUZE 4X4 12PLY STER LF (GAUZE/BANDAGES/DRESSINGS) ×3 IMPLANT
SUT BONE WAX W31G (SUTURE) ×3 IMPLANT
SUT E-PACK MINIMALLY INVASIVE (SUTURE) ×3 IMPLANT
SUT ETHIBON 2 0 V 52N 30 (SUTURE) ×1 IMPLANT
SUT ETHIBOND 2 0 SH (SUTURE) ×2 IMPLANT
SUT ETHIBOND X763 2 0 SH 1 (SUTURE) ×3 IMPLANT
SUT GORETEX CV 4 TH 22 36 (SUTURE) ×3 IMPLANT
SUT GORETEX CV4 TH-18 (SUTURE) ×6 IMPLANT
SUT PROLENE 3 0 SH1 36 (SUTURE) ×12 IMPLANT
SUT SILK 2 0 SH CR/8 (SUTURE) IMPLANT
SUT SILK 3 0 SH CR/8 (SUTURE) IMPLANT
SUT VIC AB 2-0 CTX 36 (SUTURE) IMPLANT
SUT VIC AB 3-0 SH 8-18 (SUTURE) IMPLANT
SUT VICRYL 2 TP 1 (SUTURE) IMPLANT
SYRINGE 10CC LL (SYRINGE) ×3 IMPLANT
SYSTEM SAHARA CHEST DRAIN ATS (WOUND CARE) ×3 IMPLANT
TAPE CLOTH SURG 4X10 WHT LF (GAUZE/BANDAGES/DRESSINGS) ×1 IMPLANT
TAPE PAPER 2X10 WHT MICROPORE (GAUZE/BANDAGES/DRESSINGS) ×1 IMPLANT
TOWEL OR 17X24 6PK STRL BLUE (TOWEL DISPOSABLE) ×6 IMPLANT
TOWEL OR 17X26 10 PK STRL BLUE (TOWEL DISPOSABLE) ×6 IMPLANT
TRAY FOLEY IC TEMP SENS 16FR (CATHETERS) ×3 IMPLANT
TROCAR XCEL BLADELESS 5X75MML (TROCAR) ×3 IMPLANT
TROCAR XCEL NON-BLD 11X100MML (ENDOMECHANICALS) ×6 IMPLANT
TUBE SUCT INTRACARD DLP 20F (MISCELLANEOUS) ×3 IMPLANT
TUNNELER SHEATH ON-Q 11GX8 DSP (PAIN MANAGEMENT) IMPLANT
UNDERPAD 30X30 INCONTINENT (UNDERPADS AND DIAPERS) ×3 IMPLANT
VALVE MAGNA MITRAL 25MM (Prosthesis & Implant Heart) ×1 IMPLANT
WATER STERILE IRR 1000ML POUR (IV SOLUTION) ×6 IMPLANT
WIRE BENTSON .035X145CM (WIRE) ×3 IMPLANT
YANKAUER SUCT BULB TIP NO VENT (SUCTIONS) ×1 IMPLANT

## 2014-11-08 NOTE — Procedures (Signed)
Extubation Procedure Note  Patient Details:   Name: Elaine Le DOB: [birth year] MRN: [id number]   Airway Documentation:     Evaluation  O2 sats: stable throughout Complications: No apparent complications Patient did tolerate procedure well. Bilateral Breath Sounds: Clear Suctioning: Airway Yes   Patient extubated to [place]. Vital signs stable. No complications. Patient tolerated well. NIF-22, VC-1Liter. RT will continue to monitor.  Mcneil Sober 11/08/2014, 7:01 PM

## 2014-11-08 NOTE — Anesthesia Postprocedure Evaluation (Signed)
  Anesthesia Post-op Note  Patient: Elaine Le  Procedure(s) Performed: Procedure(s): MINIMALLY INVASIVE MITRAL VALVE REPLACEMENT(MVR) (Right) TRANSESOPHAGEAL ECHOCARDIOGRAM (TEE) (N/A)  Patient Location: SICU  Anesthesia Type:General  Level of Consciousness: sedated and Patient remains intubated per anesthesia plan  Airway and Oxygen Therapy: Patient remains intubated per anesthesia plan and Patient placed on Ventilator (see vital sign flow sheet for setting)  Post-op Pain: none  Post-op Assessment: Post-op Vital signs reviewed, Patient's Cardiovascular Status Stable, Respiratory Function Stable, Patent Airway and Pain level controlled              Post-op Vital Signs: stable  Last Vitals:  Filed Vitals:   11/08/14 1900  BP: 102/56  Pulse: 65  Temp: 36.7 C  Resp: 13    Complications: No apparent anesthesia complications

## 2014-11-08 NOTE — OR Nursing (Signed)
13:20 - 45 minute call to SICU charge nurse

## 2014-11-08 NOTE — Brief Op Note (Addendum)
11/08/2014  12:42 PM  PATIENT:  Elaine Le  54 y.o. female  PRE-OPERATIVE DIAGNOSIS:  1. Moderate MS 2. Severe MR  POST-OPERATIVE DIAGNOSIS:    PROCEDURE:  TRANSESOPHAGEAL ECHOCARDIOGRAM (TEE), MINIMALLY INVASIVE MITRAL VALVE REPLACEMENT(MVR) (using a Pericardial Tissue Valve, size 25 mm)  SURGEON:    Rexene Alberts, MD  ASSISTANTS:  Nani Skillern, PA-C  ANESTHESIA:   Roberts Gaudy, MD  CROSSCLAMP TIME:   80'  CARDIOPULMONARY BYPASS TIME: 134'  FINDINGS:  Rheumatic mitral valve disease with moderate mitral stenosis and mitral regurgitation  Type IIIA dysfunction of mitral valve  Normal LV systolic function  BASELINE WEIGHT: 50 kg    Mitral Valve Etiology  MV Insufficiency: Severe  MV Disease: Yes.  MV Stenosis: Yes. Smallest Valve Area: 0.79cm2. Highest Mean Gradient: 42mmHg.  MV Disease Functional Class: MV Disease Functional Class: Type IIIa.   Etiology (Choose at least one and up to five): Rheumatic.  MV Lesions (Choose at least one): Leaflet prolapse, posterior.  Mitral/Tricuspid/Pulmonary Valve Procedure  Mitral Valve Procedure Performed:  Replacement: Repair attempted prior to replacement.  Posterior Mitral Chords preserved.  Implant: Bioprosthetic Valve: Implant model number 7300 [id number], Size 25, Unique Device Identifier [id number].  Tricuspid Valve Procedure Performed:  N/A  Implant: N/A  Pulmonary Valve Procedure Performed: N/A  Implant: N/A      COMPLICATIONS: None  BASELINE WEIGHT: 50 kg  DRAINS: Chest tubes in pleural space   SPECIMEN:  Source of Specimen:  Portion of the native MV  DISPOSITION OF SPECIMEN:  PATHOLOGY  COUNTS CORRECT:  YES  DICTATION: .Dragon Dictation  PLAN OF CARE: Admit to inpatient    Delay start of Pharmacological VTE agent (>24hrs) due to surgical blood loss or risk of bleeding: yes  PATIENT DISPOSITION:   TO SICU IN STABLE CONDITION  Rexene Alberts 11/08/2014 1:43 PM

## 2014-11-08 NOTE — OR Nursing (Signed)
13:40 - 2nd call to SICU charge nurse

## 2014-11-08 NOTE — Progress Notes (Signed)
RT received pt from OR being manually ventilated by the CRNA. RT connected pt to the ventilator. Sats are 95. BS are clear and diminished. RT will continue to monitor.

## 2014-11-08 NOTE — Interval H&P Note (Signed)
History and Physical Interval Note:  11/08/2014 7:24 AM  Elaine Le  has presented today for surgery, with the diagnosis of MR  The various methods of treatment have been discussed with the patient and family. After consideration of risks, benefits and other options for treatment, the patient has consented to  Procedure(s): MINIMALLY INVASIVE MITRAL VALVE REPAIR OR REPLACEMENT(MVR) (Right) TRANSESOPHAGEAL ECHOCARDIOGRAM (TEE) (N/A) as a surgical intervention .  The patient's history has been reviewed, patient examined, no change in status, stable for surgery.  I have reviewed the patient's chart and labs.  Questions were answered to the patient's satisfaction.     Rexene Alberts

## 2014-11-08 NOTE — Op Note (Signed)
CARDIOTHORACIC SURGERY OPERATIVE NOTE  Date of Procedure:  11/08/2014  Preoperative Diagnosis:   Rheumatic Mitral Valve Disease  Severe Mitral Regurgitation  Moderate Mitral Stenosis  Postoperative Diagnosis: Same  Procedure:    Minimally-Invasive Mitral Valve Replacement  Edwards Magna Mitral Bovine Bioprosthetic Tissue Valve (size 58mm, Model # 7300TFX, serial # [id number])   Surgeon: Valentina Gu. Roxy Manns, MD  Assistant: Nani Skillern, PA-C  Anesthesia: Roberts Gaudy, MD  Operative Findings:  Rheumatic mitral valve disease with moderate mitral stenosis and mitral regurgitation  Type IIIA dysfunction of mitral valve  Normal LV systolic function           BRIEF CLINICAL NOTE AND INDICATIONS FOR SURGERY  Patient is a 54 year old female with recently discovered rheumatic mitral valve disease including severe mitral regurgitation was mild to moderate mitral stenosis. She denies any known history of rheumatic fever in the past. She has a long-standing history of tobacco abuse with suspected COPD, fibromyalgia with chronic pain, irritable bowel syndrome with chronic constipation, and chronic fatigue. She was admitted to [facility] on 06/23/2014 with acute respiratory failure with hypercapnia requiring ventilator support. She was diagnosed with influenza A and community acquired pneumonia. The patient was quite ill at the time of her initial presentation and required intravenous pressors for blood pressure support in addition to mechanical ventilation. Her subsequent hospitalization and convalescence was slow. She was initially extubated on 06/28/2014 but required reintubation later that same day. She was extubated a second time on 06/30/2014 and required BiPAP support for the next several days. A transthoracic echocardiogram was performed 07/04/2014 that revealed normal left ventricular systolic function with ejection fraction estimated 60-65%. There  was a rheumatic appearing mitral valve with a "shaggy density" on the anterior leaflet of the mitral valve raising the question of possible vegetation. The patient was seen in consultation by Dr. Mare Ferrari from the cardiology team, and transesophageal echocardiogram was recommended. 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 There was no vegetation seen. Outpatient cardiology follow-up was planned. The patient was subsequently discharged from the hospital on 07/11/2014. The patient was not discharged from the hospital on any diuretics or other medications related to her underlying heart disease. Over the subsequent 2 weeks the patient reports the development of gradual progression of shortness of breath, orthopnea, and bilateral lower extremity edema. She also had some flulike symptoms with nonproductive cough and atypical chest pain. Symptoms became acutely worse and the patient presented to the emergency department 07/25/2014. Chest x-ray showed possible right lower lobe pneumonia but symptoms improved rapidly with intravenous diuresis and bronchodilator therapy. The patient was transferred to [facility] where she underwent left and right heart catheterization on 07/27/2014 by Dr. Angelena Form. The patient was found to have normal coronary artery anatomy with non-obstructive coronary artery disease. There was normal left ventricular systolic function with elevated left heart filling pressures and pulmonary hypertension. Cardiothoracic surgical consultation was requested. She was initially seen in consultation on 07/28/2014. The patient has been seen in consultation and counseled at length regarding the indications, risks and potential benefits of surgery.  All questions have been answered, and the patient provides full informed consent for the operation as described.     DETAILS OF THE OPERATIVE  PROCEDURE  Preparation:  The patient is brought to the operating room on the above mentioned date and central monitoring was established by the anesthesia team including placement of Swan-Ganz catheter through the left  internal jugular vein.  A radial arterial line is placed. The patient is placed in the supine position on the operating table.  Intravenous antibiotics are administered. General endotracheal anesthesia is induced uneventfully. The patient is initially intubated using a dual lumen endotracheal tube.  A Foley catheter is placed.  Baseline transesophageal echocardiogram was performed.  Findings were notable for classical rheumatic mitral valve disease. There was severe thickening with restricted leaflet motion and calcification involving both leaflets of the mitral valve. The posterior leaflet was essentially completely fixed and immobile. There was severe thickening and foreshortening of all of the subvalvular apparatus. There was moderate to severe mitral stenosis with mean transvalvular gradient estimated 13 mmHg. There was at least moderate mitral regurgitation. There was normal left ventricular size and systolic function. There was moderate left atrial enlargement. The aortic valve was normal. The tricuspid valve was normal. Right ventricular size and function was normal.  A soft roll is placed behind the patient's left scapula and the neck gently extended and turned to the left.   The patient's right neck, chest, abdomen, both groins, and both lower extremities are prepared and draped in a sterile manner. A time out procedure is performed.  Surgical Approach:  A right miniature anterolateral thoracotomy incision is performed. The incision is placed just lateral to and superior to the right nipple. The pectoralis major muscle is retracted medially and completely preserved. The right pleural space is entered through the 3rd intercostal space. A soft tissue retractor is placed.  Two 11 mm  ports are placed through separate stab incisions inferiorly. The right pleural space is insufflated continuously with carbon dioxide gas through the posterior port during the remainder of the operation.  A pledgeted sutures placed through the dome of the right hemidiaphragm and retracted inferiorly to facilitate exposure.  A longitudinal incision is made in the pericardium 3 cm anterior to the phrenic nerve and silk traction sutures are placed on either side of the incision for exposure.   Extracorporeal Cardiopulmonary Bypass and Myocardial Protection:  A small incision is made in the right inguinal crease and the anterior surface of the right common femoral artery and right common femoral vein are identified.  The patient is placed in Trendelenburg position. The right internal jugular vein is cannulated with Seldinger technique and a guidewire advanced into the right atrium. The patient is heparinized systemically. The right internal jugular vein is cannulated with a 14 [place] pediatric femoral venous cannula. Pursestring sutures are placed on the anterior surface of the right common femoral vein and right common femoral artery. The right common femoral vein is cannulated with the Seldinger technique and a guidewire is advanced under transesophageal echocardiogram guidance through the right atrium. The femoral vein is cannulated with a long 22 French femoral venous cannula. The right common femoral artery is cannulated with Seldinger technique and a flexible guidewire is advanced until it can be appreciated intraluminally in the descending thoracic aorta on transesophageal echocardiogram. The femoral artery is cannulated with an 18 French femoral arterial cannula.  Adequate heparinization is verified.     The entire pre-bypass portion of the operation was notable for stable hemodynamics.  Cardiopulmonary bypass was begun.  Vacuum assist venous drainage is utilized. The incision in the pericardium is  extended in both directions. Venous drainage and exposure are notably excellent. Attempts to place a retrograde cardioplegia cannula were unsuccessful.  An antegrade cardioplegia cannula is placed in the ascending aorta.    The patient is cooled to [place]  systemic temperature.  The aortic cross clamp is applied and cold blood cardioplegia is delivered initially in an antegrade fashion through the aortic root.    The initial cardioplegic arrest is rapid with early diastolic arrest.  Repeat doses of cardioplegia are administered intermittently every 20 to 30 minutes throughout the entire cross clamp portion of the operation through the aortic root in order to maintain completely flat electrocardiogram.  Myocardial protection was felt to be excellent.   Mitral Valve Replacement:  A left atriotomy incision was performed through the interatrial groove and extended partially across the back wall of the left atrium after opening the oblique sinus inferiorly.  The mitral valve is exposed using a self-retaining retractor.  The mitral valve was inspected and notable for classical rheumatic features with fishmouth appearance of the mitral valve. There was severe scarring and thickening involving all of the mitral valve and severe thickening and foreshortening of all of the subvalvular apparatus. All the chordae tendineae from both the anterior and posterior papillary muscles were completely fused into a large foreshortened cords. There was at least moderate mitral stenosis. There was severe restriction of mobility of both leaflets. An attempt at mitral valve repair was clearly not feasible .  Entire anterior leaflet of the mitral valve was resected. The posterior leaflet was split in the midline and debulked. Portions of the subvalvular apparatus from the posterior leaflet were preserved to both papillary muscles. There was no means to preserve any subvalvular apparatus to the anterior leaflet. The mitral annulus was  sized to accept a 25 mm stented bovine bioprosthetic tissue valve.  Mitral valve replacement is performed using interrupted 2-0 Ethibond horizontal mattress pledgeted sutures with pledgets in the supra-annular position. An [facility] Mitral bovine bioprosthetic tissue valve  (size 12mm, Model # 7300TFX, serial # [id number]) was prepared per manufacturer's recommendations and sewn in place uneventfully. Care was taken to orient the commissural posts of the valve to straddle the left ventricular outflow tract. The valve was seated uneventfully and all valve sutures secured using the Cor-knot device.  The valve is again tested with saline and appears to be perfectly competent with no sign of paravalvular leak.  Rewarming is begun.   Procedure Completion:  The atriotomy was closed using a 2-layer closure of running 3-0 Prolene suture after placing a sump drain across the mitral valve to serve as a left ventricular vent.  One final dose of warm "hot shot" cardioplegia was administered through the aortic root.  The aortic cross clamp was removed after a total cross clamp time of 81 minutes.  Epicardial pacing wires are fixed to the inferior wall of the right ventricule and to the right atrial appendage. The patient is rewarmed to 37C temperature. The left ventricular vent is removed.  The patient is ventilated and flow volumes turndown while the mitral valve repair is inspected using transesophageal echocardiogram. The valve repair appears intact with normal valve function. The antegrade cardioplegia cannula is now removed. The patient is weaned and disconnected from cardiopulmonary bypass.  The patient's rhythm at separation from bypass was AV paced.  The patient was weaned from bypass without any inotropic support. Total cardiopulmonary bypass time for the operation was 134 minutes.  Followup transesophageal echocardiogram performed after separation from bypass revealed  a well-seated bioprosthetic tissue  valve in the mitral position with a normal function. There was no paravalvular leak.  Left ventricular function was unchanged from preoperatively.  The mean gradient across the mitral valve was estimated to  be 3 mmHg.  The femoral arterial and venous cannulae were removed uneventfully. There was a palpable pulse in the distal right common femoral artery after removal of the cannula. Protamine was administered to reverse the anticoagulation. The right internal jugular cannula was removed and manual pressure held on the neck for 15 minutes.  Single lung ventilation was begun. The atriotomy closure was inspected for hemostasis. The pericardial sac was drained using a 28 French Bard drain placed through the anterior port incision.  The pericardium was closed using a patch of core matrix bovine submucosal tissue patch. The right pleural space is irrigated with saline solution and inspected for hemostasis. The right pleural space was drained using a 28 French Bard drain placed through the posterior port incision. The miniature thoracotomy incision was closed in multiple layers in routine fashion. The right groin incision was inspected for hemostasis and closed in multiple layers in routine fashion.  The post-bypass portion of the operation was notable for stable rhythm and hemodynamics.  No blood products were administered during the operation.   Disposition:  The patient tolerated the procedure well.  The patient was reintubated using a single lumen endotracheal tube and subsequently transported to the surgical intensive care unit in stable condition. There were no intraoperative complications. All sponge instrument and needle counts are verified correct at completion of the operation.     Valentina Gu. Roxy Manns MD 11/08/2014 1:50 PM

## 2014-11-08 NOTE — CV Procedure (Signed)
Intra-operative Transesophageal Echocardiography Report:  Elaine Le is a 54 year old female with rheumatic mitral valve disease with mixed mitral regurgitation and mitral stenosis. She has  normal coronary anatomy and normal left ventricular systolic function. He is now scheduled to undergo mitral valve repair or replacement by Dr. Roxy Manns using the minimally invasive approach. Intraoperative transesophageal echocardiography was indicated to evaluate the mitral valve and to assist with the surgery, to assess for any other valvular pathology, and to serve as a monitor for intraoperative right and left ventricular function and volume status.  The patient was brought to the operating room at [facility] and general anesthesia was induced without difficulty. Following endotracheal intubation and orogastric suctioning, the transesophageal echocardiography probe was inserted into the esophagus without difficulty.  Impression: Pre-bypass findings:  1. Mitral valve: There was  mixed mitral stenosis and mitral regurgitation. The anterior and posterior leaflets were both thickened and restricted in motion. There was diastolic doming of the anterior leaflet with the characteristic hockey-stick deformity. The posterior leaflet had more severely restricted mobility. There was 2-3+ mitral insufficiency. This was with a central jet. There was mitral stenosis with a mean transmitral gradient of 13 mmHg and a maximal instantaneous gradient of 17 mmHg. There were no vegetations noted on the mitral valve. There was thickening of the subvalvular apparatus but no obvious calcification noted in the subvalvular apparatus or in the mitral leaflets.  2. Aortic valve: Aortic valve was trileaflet. The leaflets are mildly thickened but opened normally. There was no aortic insufficiency.  3. Left ventricle: The left ventricular size was normal. There was vigorous contractility in all segments interrogated and the  ejection fraction was estimated at 60-65%. Left ventricular end-diastolic diameter measured 4.02 cm at end diastole at the mid-papillary level in the transgastric short axis view. The anterior wall measured 0.87 cm. The posterior wall measured 0.90 cm. Were no regional wall motion abnormalities.  4. Right ventricle: Right ventricular cavity was of normal size. There was normal contractility of the right ventricular free wall and normal appearing right ventricular function.  5: Tricuspid valve: The tricuspid valve appeared structurally normal without thickening of the leaflets. There was no tricuspid insufficiency seen with color Doppler.  6. Interatrial septum: The interatrial septum was intact without evidence of patent foramen ovale or atrial septal defect by color Doppler and bubble study.  7. Left atrium: The left atrial cavity was moderately enlarged and measured 6.18 cm in the superior inferior dimension and 4.18 cm in the mediolateral dimension. There was no thrombus noted within the left atrium or left atrial appendage.   8. Ascending aorta: The ascending aorta was normal appearing. There was a well-defined aortic root and sinotubular ridge without effacement. There was mild thickening of the wall of the ascending aorta but no atheromatous disease that could be appreciated.  9. Descending aorta: There were scattered atheromatous plaques noted within the descending aorta. The descending aorta measured 1.86 cm in diameter.  Post-bypass findings:  1. Mitral valve: There was a bioprosthetic valve in the mitral position. The leaflets all opened normally. There was a central jet of mitral insufficiency which appeared mild trace to mild. There were no perivalvular jets of mitral insufficiency. Mean transmitral gradient was 3 mmHg and peak gradient was 7 mmHg.  2. Aortic valve: The aortic valve appeared unchanged from the pre-bypass study. The leaflets opened normally  with no was no aortic  insufficiency.  3. Left ventricle: There was vigorous contractility in all segments interrogated  and the ejection fraction was estimated at 60-65%.  4. Right ventricle: The right ventricular size was normal with normal contractility of the right ventricle free wall  5. Tricuspid valve: Tricuspid valve appeared unchanged in the pre-bypass study there was no tricuspid insufficiency appreciated.  Roberts Gaudy, M.D.

## 2014-11-08 NOTE — Transfer of Care (Signed)
Immediate Anesthesia Transfer of Care Note  Patient: Elaine Le  Procedure(s) Performed: Procedure(s): MINIMALLY INVASIVE MITRAL VALVE REPLACEMENT(MVR) (Right) TRANSESOPHAGEAL ECHOCARDIOGRAM (TEE) (N/A)  Patient Location: SICU  Anesthesia Type:General  Level of Consciousness: sedated  Airway & Oxygen Therapy: Patient remains intubated per anesthesia plan and Patient placed on Ventilator (see vital sign flow sheet for setting)  Post-op Assessment: Report given to RN  Post vital signs: Reviewed and stable  Last Vitals:  Filed Vitals:   11/08/14 0649  BP: 106/59  Pulse: 85  Temp: 36.8 C  Resp: 18    Complications: No apparent anesthesia complications

## 2014-11-08 NOTE — Anesthesia Procedure Notes (Addendum)
Procedure Name: Intubation Date/Time: 11/08/2014 9:02 AM Performed by: Suzy Bouchard Pre-anesthesia Checklist: Patient identified, Emergency Drugs available, Patient being monitored, Suction available and Timeout performed Patient Re-evaluated:Patient Re-evaluated prior to inductionOxygen Delivery Method: Circle system utilized Preoxygenation: Pre-oxygenation with 100% oxygen Intubation Type: IV induction Ventilation: Oral airway inserted - appropriate to patient size Laryngoscope Size: Mac and 3 Grade View: Grade I Tube type: Oral Endobronchial tube: Double lumen EBT and 35 Fr Number of attempts: 1 Airway Equipment and Method: Stylet Placement Confirmation: ETT inserted through vocal cords under direct vision,  positive ETCO2 and breath sounds checked- equal and bilateral Tube secured with: Tape Dental Injury: Teeth and Oropharynx as per pre-operative assessment  Comments: AOI by A Holtzman SRNA. DLT position confirmed by fiberoptic scope and auscultation.    Procedure Name: Intubation Date/Time: 11/08/2014 2:20 PM Performed by: Suzy Bouchard Pre-anesthesia Checklist: Patient identified, Emergency Drugs available, Suction available, Patient being monitored and Timeout performed Patient Re-evaluated:Patient Re-evaluated prior to inductionOxygen Delivery Method: Circle system utilized Preoxygenation: Pre-oxygenation with 100% oxygen Tube type: Oral Tube size: 7.5 mm Number of attempts: 1 Airway Equipment and Method: Video-laryngoscopy Placement Confirmation: ETT inserted through vocal cords under direct vision,  positive ETCO2 and breath sounds checked- equal and bilateral Secured at: 21 cm Tube secured with: Tape Dental Injury: Teeth and Oropharynx as per pre-operative assessment  Comments: Exchanged double lumen tube over cook catheter for 7.5 parker tube.

## 2014-11-08 NOTE — OR Nursing (Signed)
Off pump call to SICU charge nurse at [phone].

## 2014-11-08 NOTE — Progress Notes (Signed)
      [address]       [place] [address]             [phone]      S/p MVR today  BP 92/53 mmHg  Pulse 63  Temp(Src) 97.7 F (36.5 C) (Oral)  Resp 11  Ht 5' (1.524 m)  Wt 111 lb 3 oz (50.434 kg)  BMI 21.71 kg/m2  SpO2 97%  28/15, CI= 2.4  Intake/Output Summary (Last 24 hours) at 11/08/14 1844 Last data filed at 11/08/14 1800  Gross per 24 hour  Intake 3263.19 ml  Output   2403 ml  Net 860.19 ml   Just extubated  Minimal CT output  Remo Lipps C. Roxan Hockey, MD [facility] [phone]

## 2014-11-08 NOTE — Anesthesia Preprocedure Evaluation (Addendum)
Anesthesia Evaluation  Patient identified by MRN, date of birth, ID band Patient awake    Reviewed: Allergy & Precautions, NPO status   History of Anesthesia Complications Negative for: history of anesthetic complications  Airway Mallampati: I  TM Distance: <3 FB Neck ROM: Full    Dental  (+) Teeth Intact, Dental Advisory Given   Pulmonary pneumonia -, resolved, COPDformer smoker,  Quit smoking 2/16  breath sounds clear to auscultation        Cardiovascular + CAD Rhythm:Regular Rate:Normal  Mild non-obstructive CAD   Neuro/Psych  Headaches, PSYCHIATRIC DISORDERS  Neuromuscular disease    GI/Hepatic Neg liver ROS, GERD-  ,  Endo/Other  negative endocrine ROS  Renal/GU negative Renal ROS  negative genitourinary   Musculoskeletal  (+) Fibromyalgia -  Abdominal   Peds  Hematology negative hematology ROS (+)   Anesthesia Other Findings   Reproductive/Obstetrics negative OB ROS                        Anesthesia Physical Anesthesia Plan  ASA: III  Anesthesia Plan: General   Post-op Pain Management:    Induction: Intravenous  Airway Management Planned: Double Lumen EBT  Additional Equipment: Arterial line, CVP, PA Cath and 3D TEE  Intra-op Plan:   Post-operative Plan:   Informed Consent: I have reviewed the patients History and Physical, chart, labs and discussed the procedure including the risks, benefits and alternatives for the proposed anesthesia with the patient or authorized representative who has indicated his/her understanding and acceptance.   Dental advisory given  Plan Discussed with: CRNA and Anesthesiologist  Anesthesia Plan Comments:         Anesthesia Quick Evaluation

## 2014-11-08 NOTE — Progress Notes (Signed)
  Echocardiogram Echocardiogram Transesophageal has been performed.  Diamond Nickel 11/08/2014, 9:56 AM

## 2014-11-09 ENCOUNTER — Inpatient Hospital Stay (HOSPITAL_COMMUNITY): Payer: Medicare Other

## 2014-11-09 ENCOUNTER — Encounter (HOSPITAL_COMMUNITY): Payer: Self-pay | Admitting: Thoracic Surgery (Cardiothoracic Vascular Surgery)

## 2014-11-09 LAB — GLUCOSE, CAPILLARY
GLUCOSE-CAPILLARY: 108 mg/dL — AB (ref 65–99)
GLUCOSE-CAPILLARY: 106 mg/dL — AB (ref 65–99)
GLUCOSE-CAPILLARY: 114 mg/dL — AB (ref 65–99)
Glucose-Capillary: 100 mg/dL — ABNORMAL HIGH (ref 65–99)
Glucose-Capillary: 112 mg/dL — ABNORMAL HIGH (ref 65–99)
Glucose-Capillary: 121 mg/dL — ABNORMAL HIGH (ref 65–99)
Glucose-Capillary: 125 mg/dL — ABNORMAL HIGH (ref 65–99)

## 2014-11-09 LAB — POCT I-STAT 3, ART BLOOD GAS (G3+)
ACID-BASE DEFICIT: 4 mmol/L — AB (ref 0.0–2.0)
Acid-base deficit: 4 mmol/L — ABNORMAL HIGH (ref 0.0–2.0)
Bicarbonate: 22.9 mEq/L (ref 20.0–24.0)
Bicarbonate: 22.7 mEq/L (ref 20.0–24.0)
O2 SAT: 99 %
O2 SAT: 96 %
PCO2 ART: 47.3 mmHg — AB (ref 35.0–45.0)
PH ART: 7.29 — AB (ref 7.350–7.450)
PO2 ART: 136 mmHg — AB (ref 80.0–100.0)
Patient temperature: 36.8
Patient temperature: 37
TCO2: 24 mmol/L (ref 0–100)
TCO2: 24 mmol/L (ref 0–100)
pCO2 arterial: 48 mmHg — ABNORMAL HIGH (ref 35.0–45.0)
pH, Arterial: 7.284 — ABNORMAL LOW (ref 7.350–7.450)
pO2, Arterial: 94 mmHg (ref 80.0–100.0)

## 2014-11-09 LAB — POCT I-STAT, CHEM 8
BUN: 11 mg/dL (ref 6–20)
CALCIUM ION: 1.15 mmol/L (ref 1.12–1.23)
CHLORIDE: 110 mmol/L (ref 101–111)
CREATININE: 0.5 mg/dL (ref 0.44–1.00)
Glucose, Bld: 138 mg/dL — ABNORMAL HIGH (ref 65–99)
HCT: 32 % — ABNORMAL LOW (ref 36.0–46.0)
HEMOGLOBIN: 10.9 g/dL — AB (ref 12.0–15.0)
Potassium: 5.1 mmol/L (ref 3.5–5.1)
Sodium: 138 mmol/L (ref 135–145)
TCO2: 20 mmol/L (ref 0–100)

## 2014-11-09 LAB — CBC
HCT: 31.9 % — ABNORMAL LOW (ref 36.0–46.0)
HEMATOCRIT: 29.5 % — AB (ref 36.0–46.0)
Hemoglobin: 9.6 g/dL — ABNORMAL LOW (ref 12.0–15.0)
Hemoglobin: 10.1 g/dL — ABNORMAL LOW (ref 12.0–15.0)
MCH: 29.5 pg (ref 26.0–34.0)
MCH: 28.7 pg (ref 26.0–34.0)
MCHC: 32.5 g/dL (ref 30.0–36.0)
MCHC: 31.7 g/dL (ref 30.0–36.0)
MCV: 90.8 fL (ref 78.0–100.0)
MCV: 90.6 fL (ref 78.0–100.0)
PLATELETS: 148 10*3/uL — AB (ref 150–400)
Platelets: 150 10*3/uL (ref 150–400)
RBC: 3.25 MIL/uL — ABNORMAL LOW (ref 3.87–5.11)
RBC: 3.52 MIL/uL — ABNORMAL LOW (ref 3.87–5.11)
RDW: 13.9 % (ref 11.5–15.5)
RDW: 14.2 % (ref 11.5–15.5)
WBC: 12.9 10*3/uL — ABNORMAL HIGH (ref 4.0–10.5)
WBC: 16.3 10*3/uL — ABNORMAL HIGH (ref 4.0–10.5)

## 2014-11-09 LAB — CREATININE, SERUM
Creatinine, Ser: 0.48 mg/dL (ref 0.44–1.00)
GFR calc Af Amer: >60 mL/min (ref >60)
GFR calc non Af Amer: >60 mL/min (ref >60)

## 2014-11-09 LAB — BASIC METABOLIC PANEL
ANION GAP: 6 (ref 5–15)
BUN: 7 mg/dL (ref 6–20)
CO2: 22 mmol/L (ref 22–32)
Calcium: 7.4 mg/dL — ABNORMAL LOW (ref 8.9–10.3)
Chloride: 109 mmol/L (ref 101–111)
Creatinine, Ser: 0.54 mg/dL (ref 0.44–1.00)
Glucose, Bld: 128 mg/dL — ABNORMAL HIGH (ref 65–99)
Potassium: 3.8 mmol/L (ref 3.5–5.1)
Sodium: 137 mmol/L (ref 135–145)

## 2014-11-09 LAB — MAGNESIUM
MAGNESIUM: 2.6 mg/dL — AB (ref 1.7–2.4)
Magnesium: 2.1 mg/dL (ref 1.7–2.4)

## 2014-11-09 MED ORDER — FUROSEMIDE 10 MG/ML IJ SOLN
20.0000 mg | Freq: Once | INTRAMUSCULAR | Status: AC
  Administered 2014-11-09: 20 mg via INTRAVENOUS
  Filled 2014-11-09: qty 2

## 2014-11-09 MED ORDER — CETYLPYRIDINIUM CHLORIDE 0.05 % MT LIQD
7.0000 mL | Freq: Two times a day (BID) | OROMUCOSAL | Status: DC
  Administered 2014-11-09 – 2014-11-13 (×6): 7 mL via OROMUCOSAL

## 2014-11-09 MED ORDER — CLOTRIMAZOLE 1 % VA CREA
1.0000 | TOPICAL_CREAM | Freq: Every day | VAGINAL | Status: DC
  Administered 2014-11-10 – 2014-11-14 (×6): 1 via VAGINAL
  Filled 2014-11-09: qty 45

## 2014-11-09 MED ORDER — INSULIN ASPART 100 UNIT/ML ~~LOC~~ SOLN
0.0000 [IU] | SUBCUTANEOUS | Status: DC
  Administered 2014-11-09 – 2014-11-10 (×5): 2 [IU] via SUBCUTANEOUS

## 2014-11-09 MED ORDER — WARFARIN - PHYSICIAN DOSING INPATIENT
Freq: Every day | Status: DC
Start: 2014-11-09 — End: 2014-11-15
  Administered 2014-11-12 – 2014-11-14 (×3)

## 2014-11-09 MED ORDER — WARFARIN SODIUM 2.5 MG PO TABS
2.5000 mg | ORAL_TABLET | Freq: Every day | ORAL | Status: DC
  Administered 2014-11-09 – 2014-11-12 (×4): 2.5 mg via ORAL
  Filled 2014-11-09 (×5): qty 1

## 2014-11-09 MED ORDER — METOCLOPRAMIDE HCL 5 MG/ML IJ SOLN
10.0000 mg | Freq: Three times a day (TID) | INTRAMUSCULAR | Status: AC
  Administered 2014-11-09 – 2014-11-10 (×3): 10 mg via INTRAVENOUS
  Filled 2014-11-09 (×3): qty 2

## 2014-11-09 MED ORDER — FUROSEMIDE 10 MG/ML IJ SOLN
20.0000 mg | Freq: Three times a day (TID) | INTRAMUSCULAR | Status: AC
  Administered 2014-11-09 – 2014-11-10 (×2): 20 mg via INTRAVENOUS
  Filled 2014-11-09 (×2): qty 2

## 2014-11-09 MED ORDER — ALBUMIN HUMAN 5 % IV SOLN
12.5000 g | Freq: Once | INTRAVENOUS | Status: AC
  Administered 2014-11-09: 12.5 g via INTRAVENOUS
  Filled 2014-11-09: qty 250

## 2014-11-09 MED ORDER — FUROSEMIDE 10 MG/ML IJ SOLN: 20.0000 mg | Freq: Four times a day (QID) | INTRAMUSCULAR | Status: DC

## 2014-11-09 MED ORDER — FENTANYL CITRATE (PF) 100 MCG/2ML IJ SOLN
25.0000 ug | INTRAMUSCULAR | Status: DC | PRN
  Administered 2014-11-09 (×4): 50 ug via INTRAVENOUS
  Filled 2014-11-09 (×5): qty 2

## 2014-11-09 MED ORDER — POTASSIUM CHLORIDE 10 MEQ/50ML IV SOLN
10.0000 meq | INTRAVENOUS | Status: AC
  Administered 2014-11-09 (×2): 10 meq via INTRAVENOUS

## 2014-11-09 NOTE — Progress Notes (Signed)
Anesthesiology Follow-up:  54 year old female one day S/P MVR for rheumatic mitral valve disease with #25 bioprosthetic valve.  Awake and alert, in good spirits, taking PO liquids, up in chair today, hemodynamically stable in SR.  VS: T-37.4 BP- 101/59 HR- 76 (SR)  RR- 16 O2 sat 100% on 2L  K-3.8 BUN/Cr 7/0.54 glucose 128 H/H 9.6/29.5 Platelets- 148,000  Extubated 4 1/2 hours post-op. No apparent complications  Roberts Gaudy

## 2014-11-09 NOTE — Progress Notes (Signed)
Patient ID: Elaine Le, female   DOB: [birth year], 54 y.o.   MRN: [id number] EVENING ROUNDS NOTE :     [address]       [place] [address]             [phone]                 1 Day Post-Op Procedure(s) (LRB): MINIMALLY INVASIVE MITRAL VALVE REPLACEMENT(MVR) (Right) TRANSESOPHAGEAL ECHOCARDIOGRAM (TEE) (N/A)  Total Length of Stay:  LOS: 1 day  BP 116/58 mmHg  Pulse 79  Temp(Src) 99.3 F (37.4 C) (Oral)  Resp 17  Ht 5' (1.524 m)  Wt 126 lb 8 oz (57.38 kg)  BMI 24.71 kg/m2  SpO2 100%  .Intake/Output      07/13 0701 - 07/14 0700 07/14 0701 - 07/15 0700   P.O.  240   I.V. (mL/kg) 3652.5 (63.7) 3.8 (0.1)   Blood 210    IV Piggyback 1600 150   Total Intake(mL/kg) 5462.5 (95.2) 393.8 (6.9)   Urine (mL/kg/hr) 1425 (1) 1060 (1.6)   Blood 1300 (0.9)    Chest Tube 638 (0.5) 580 (0.9)   Total Output 3363 1640   Net +2099.5 -1246.2          . sodium chloride       Lab Results  Component Value Date   WBC 16.3* 11/09/2014   HGB 10.1* 11/09/2014   HCT 31.9* 11/09/2014   PLT 150 11/09/2014   GLUCOSE 128* 11/09/2014   TRIG 115 06/28/2014   ALT 29 11/06/2014   AST 24 11/06/2014   NA 137 11/09/2014   K 3.8 11/09/2014   CL 109 11/09/2014   CREATININE 0.48 11/09/2014   BUN 7 11/09/2014   CO2 22 11/09/2014   TSH 3.75 01/21/2011   INR 1.24 11/08/2014   HGBA1C 6.3* 11/06/2014   Stable day Complaint  of bloating, started on [place] MD  Beeper [phone] Office [phone] 11/09/2014 6:40 PM

## 2014-11-09 NOTE — Progress Notes (Signed)
[address]       [place] [address]             [phone]        CARDIOTHORACIC SURGERY PROGRESS NOTE   R1 Day Post-Op Procedure(s) (LRB): MINIMALLY INVASIVE MITRAL VALVE REPLACEMENT(MVR) (Right) TRANSESOPHAGEAL ECHOCARDIOGRAM (TEE) (N/A)  Subjective: Looks very good.  Mild soreness.  Primary complaint is pain in throat from ET tube.  No SOB  Objective: Vital signs: BP Readings from Last 1 Encounters:  11/09/14 106/55   Pulse Readings from Last 1 Encounters:  11/09/14 71   Resp Readings from Last 1 Encounters:  11/09/14 15   Temp Readings from Last 1 Encounters:  11/09/14 99 F (37.2 C) Core (Comment)    Hemodynamics: PAP: (27-49)/(12-27) 35/18 mmHg CO:  [2.7 L/min-4.1 L/min] 4.1 L/min CI:  [1.9 L/min/m2-2.8 L/min/m2] 2.8 L/min/m2  Physical Exam:  Rhythm:   NSR  Breath sounds: clear  Heart sounds:  RRR w/out murmur  Incisions:  Dressings dry, intact  Abdomen:  Soft, non-distended, non-tender  Extremities:  Warm, well-perfused  Chest tubes:  Low volume thin serosanguinous output, no air leak    Intake/Output from previous day: 07/13 0701 - 07/14 0700 In: 5462.5 [I.V.:3652.5; Blood:210; IV Piggyback:1600] Out: 7858 [Urine:1425; Blood:1300; Chest Tube:638] Intake/Output this shift:    Lab Results:  CBC: Recent Labs  11/08/14 2030 11/09/14 0438  WBC 14.9* 12.9*  HGB 11.1* 9.6*  HCT 33.6* 29.5*  PLT 176 148*    BMET:  Recent Labs  11/06/14 1337  11/08/14 2007 11/08/14 2030 11/09/14 0438  NA 138  < > 138  --  137  K 3.7  < > 5.1  --  3.8  CL 105  < > 110  --  109  CO2 23  --   --   --  22  GLUCOSE 117*  < > 138*  --  128*  BUN 7  < > 11  --  7  CREATININE 0.60  < > 0.50 0.60 0.54  CALCIUM 9.6  --   --   --  7.4*  < > = values in this interval not displayed.   PT/INR:   Recent Labs  11/08/14 1445  LABPROT 15.8*  INR 1.24    CBG (last 3)   Recent Labs  11/08/14 2057 11/08/14 2202 11/08/14 2305    GLUCAP 131* 130* 156*    ABG    Component Value Date/Time   PHART 7.290* 11/08/2014 2059   PCO2ART 47.3* 11/08/2014 2059   PO2ART 94.0 11/08/2014 2059   HCO3 22.7 11/08/2014 2059   TCO2 24 11/08/2014 2059   ACIDBASEDEF 4.0* 11/08/2014 2059   O2SAT 96.0 11/08/2014 2059    CXR: PORTABLE CHEST - 1 VIEW  COMPARISON: 11/08/2014.  FINDINGS: Interim extubation and removal of NG tube. Swan-Ganz catheter noted with tip in the of main pulmonary artery. Left IJ catheter in stable position. Prior mitral valve replacement. Cardiomegaly with pulmonary vascular prominence and bilateral interstitial prominence with small left pleural effusion. These findings are consistent with congestive heart failure. Basilar subsegmental atelectasis. No pneumothorax.  IMPRESSION: 1. Interim extubation and removal of NG tube. Swan-Ganz catheter tip in the main pulmonary artery. Left IJ line and bilateral chest tubes in stable position. 2. Prior mitral valve replacement. Cardiomegaly with pulmonary venous congestion, bilateral interstitial prominence, and small left pleural effusion. Findings consistent with congestive heart failure. 3. Basilar subsegmental atelectasis.   Electronically Signed  By: Marcello Moores Register  On: 11/09/2014 07:16  Assessment/Plan: S/P Procedure(s) (LRB): MINIMALLY INVASIVE MITRAL VALVE REPLACEMENT(MVR) (Right) TRANSESOPHAGEAL ECHOCARDIOGRAM (TEE) (N/A)  Doing well POD1 Maintaining NSR w/ stable hemodynamics on very low dose Neo drip Expected post op acute blood loss anemia, mild, stable Expected post op volume excess, mild Expected post op atelectasis, mild   Mobilize  D/C lines  Gentle diuresis  Start coumadin slowly   Elaine Le 11/09/2014 7:55 AM

## 2014-11-09 NOTE — Addendum Note (Signed)
Addendum  created 11/09/14 1705 by Roberts Gaudy, MD   Modules edited: Notes Section   Notes Section:  File: [id number]; [place]: [id number]

## 2014-11-10 ENCOUNTER — Inpatient Hospital Stay (HOSPITAL_COMMUNITY): Payer: Medicare Other

## 2014-11-10 LAB — CBC
HCT: 31.7 % — ABNORMAL LOW (ref 36.0–46.0)
HEMOGLOBIN: 10.2 g/dL — AB (ref 12.0–15.0)
MCH: 29.6 pg (ref 26.0–34.0)
MCHC: 32.2 g/dL (ref 30.0–36.0)
MCV: 91.9 fL (ref 78.0–100.0)
Platelets: 152 10*3/uL (ref 150–400)
RBC: 3.45 MIL/uL — ABNORMAL LOW (ref 3.87–5.11)
RDW: 14.4 % (ref 11.5–15.5)
WBC: 15.6 10*3/uL — AB (ref 4.0–10.5)

## 2014-11-10 LAB — POCT I-STAT, CHEM 8
BUN: 4 mg/dL — AB (ref 6–20)
CALCIUM ION: 1.18 mmol/L (ref 1.12–1.23)
Chloride: 106 mmol/L (ref 101–111)
Creatinine, Ser: 0.5 mg/dL (ref 0.44–1.00)
Glucose, Bld: 132 mg/dL — ABNORMAL HIGH (ref 65–99)
HCT: 30 % — ABNORMAL LOW (ref 36.0–46.0)
HEMOGLOBIN: 10.2 g/dL — AB (ref 12.0–15.0)
POTASSIUM: 4 mmol/L (ref 3.5–5.1)
SODIUM: 135 mmol/L (ref 135–145)
TCO2: 23 mmol/L (ref 0–100)

## 2014-11-10 LAB — GLUCOSE, CAPILLARY
Glucose-Capillary: 152 mg/dL — ABNORMAL HIGH (ref 65–99)
Glucose-Capillary: 151 mg/dL — ABNORMAL HIGH (ref 65–99)
Glucose-Capillary: 152 mg/dL — ABNORMAL HIGH (ref 65–99)
Glucose-Capillary: 121 mg/dL — ABNORMAL HIGH (ref 65–99)
Glucose-Capillary: 130 mg/dL — ABNORMAL HIGH (ref 65–99)

## 2014-11-10 LAB — BASIC METABOLIC PANEL
ANION GAP: 3 — AB (ref 5–15)
CHLORIDE: 104 mmol/L (ref 101–111)
CO2: 28 mmol/L (ref 22–32)
Calcium: 8.1 mg/dL — ABNORMAL LOW (ref 8.9–10.3)
Creatinine, Ser: 0.57 mg/dL (ref 0.44–1.00)
GFR calc Af Amer: >60 mL/min (ref >60)
GFR calc non Af Amer: >60 mL/min (ref >60)
Glucose, Bld: 164 mg/dL — ABNORMAL HIGH (ref 65–99)
Potassium: 3.9 mmol/L (ref 3.5–5.1)
Sodium: 135 mmol/L (ref 135–145)

## 2014-11-10 LAB — TYPE AND SCREEN
ABO/RH(D): A NEG
Antibody Screen: NEGATIVE
UNIT DIVISION: 0
Unit division: 0

## 2014-11-10 LAB — PROTIME-INR
INR: 1.34 (ref 0.00–1.49)
Prothrombin Time: 16.7 seconds — ABNORMAL HIGH (ref 11.6–15.2)

## 2014-11-10 MED ORDER — SODIUM CHLORIDE 0.9 % IJ SOLN
3.0000 mL | Freq: Two times a day (BID) | INTRAMUSCULAR | Status: DC
  Administered 2014-11-10 – 2014-11-11 (×2): 3 mL via INTRAVENOUS

## 2014-11-10 MED ORDER — MOVING RIGHT ALONG BOOK
Freq: Once | Status: AC
  Administered 2014-11-10: 12:00:00
  Filled 2014-11-10: qty 1

## 2014-11-10 MED ORDER — SODIUM CHLORIDE 0.9 % IV SOLN: 250.0000 mL | INTRAVENOUS | Status: DC | PRN

## 2014-11-10 MED ORDER — SODIUM CHLORIDE 0.9 % IJ SOLN
3.0000 mL | INTRAMUSCULAR | Status: DC | PRN
  Administered 2014-11-10: 3 mL via INTRAVENOUS
  Filled 2014-11-10: qty 3

## 2014-11-10 NOTE — Progress Notes (Signed)
[address]       [place]aine Le,[place] [address]             [phone]        CARDIOTHORACIC SURGERY PROGRESS NOTE   R2 Days Post-Op Procedure(s) (LRB): MINIMALLY INVASIVE MITRAL VALVE REPLACEMENT(MVR) (Right) TRANSESOPHAGEAL ECHOCARDIOGRAM (TEE) (N/A)  Subjective: Feels well and looks good.  Objective: Vital signs: BP Readings from Last 1 Encounters:  11/10/14 102/58   Pulse Readings from Last 1 Encounters:  11/10/14 68   Resp Readings from Last 1 Encounters:  11/10/14 19   Temp Readings from Last 1 Encounters:  11/10/14 98.5 F (36.9 C) Oral    Hemodynamics:    Physical Exam:  Rhythm:   sinus  Breath sounds: clear  Heart sounds:  RRR w/out murmur  Incisions:  Dressing dry, intact  Abdomen:  Soft, non-distended, non-tender  Extremities:  Warm, well-perfused  Chest tubes:  Low volume thin serosanguinous output, no air leak    Intake/Output from previous day: 07/14 0701 - 07/15 0700 In: 1153.8 [P.O.:720; I.V.:233.8; IV Piggyback:200] Out: 4315 [Urine:2380; Chest Tube:880] Intake/Output this shift: Total I/O In: 20 [I.V.:20] Out: 105 [Urine:75; Chest Tube:30]  Lab Results:  CBC: Recent Labs  11/09/14 1700 11/09/14 1724 11/10/14 0400  WBC 16.3*  --  15.6*  HGB 10.1* 10.2* 10.2*  HCT 31.9* 30.0* 31.7*  PLT 150  --  152    BMET:  Recent Labs  11/09/14 0438  11/09/14 1724 11/10/14 0400  NA 137  --  135 135  K 3.8  --  4.0 3.9  CL 109  --  106 104  CO2 22  --   --  28  GLUCOSE 128*  --  132* 164*  BUN 7  --  4* <5*  CREATININE 0.54  < > 0.50 0.57  CALCIUM 7.4*  --   --  8.1*  < > = values in this interval not displayed.   PT/INR:   Recent Labs  11/10/14 0400  LABPROT 16.7*  INR 1.34    CBG (last 3)   Recent Labs  11/09/14 1949 11/09/14 2325 11/10/14 0340  GLUCAP 152* 151* 152*    ABG    Component Value Date/Time   PHART 7.290* 11/08/2014 2059   PCO2ART 47.3* 11/08/2014 2059   PO2ART 94.0 11/08/2014 2059    HCO3 22.7 11/08/2014 2059   TCO2 23 11/09/2014 1724   ACIDBASEDEF 4.0* 11/08/2014 2059   O2SAT 96.0 11/08/2014 2059    CXR: PORTABLE CHEST - 1 VIEW  COMPARISON: 11/09/2014.  FINDINGS: Interim removal Swan-Ganz catheter. Left IJ lines and bilateral chest tubes in stable position. Mitral valve replacement. Cardiomegaly. Interim slight improvement pulmonary venous congestion interstitial pulmonary edema. Persistent small pleural effusion. No pneumothorax. Surgical clips left chest.  IMPRESSION: 1. Interim removal Swan-Ganz catheter. Left IJ lines and bilateral chest tubes in stable position. No pneumothorax. 2. Prior mitral valve replacement. Cardiomegaly with interim improvement of pulmonary vascular prominence. Persistent but slightly improved bilateral pulmonary interstitial edema. Small left pleural effusion again noted .   Electronically Signed  By: Marcello Moores Register  On: 11/10/2014 07:14   Assessment/Plan: S/P Procedure(s) (LRB): MINIMALLY INVASIVE MITRAL VALVE REPLACEMENT(MVR) (Right) TRANSESOPHAGEAL ECHOCARDIOGRAM (TEE) (N/A)  Doing very well POD2 Maintaining NSR w/ stable BP Expected post op acute blood loss anemia, mild, stable Expected post op volume excess, mild Expected post op atelectasis, mild   Mobilize  D/C foley and central line  Leave chest tubes until output decreases further  Coumadin  Transfer step down  Rexene Alberts 11/10/2014 9:00 AM

## 2014-11-10 NOTE — Progress Notes (Signed)
11/10/2014 1700 Received pt to room 2w 25 from 2S.  Pt is A&O, c/o a lot of pain at this time, will medicate.  Tel monitor applied and CCMD notified. Oriented to room, call light and bed.  Call bell in reach.   Carney Corners

## 2014-11-10 NOTE — Care Management Important Message (Signed)
Important Message  Patient Details  Name: Elaine Le MRN: [id number] Date of Birth: [birth year]   Medicare Important Message Given:  Yes-second notification given    Pricilla Handler 11/10/2014, 11:30 AM

## 2014-11-10 NOTE — Progress Notes (Signed)
11/10/2014 10:16 PM Patient spontaneously went into AFib with heart rate mostly 70-100.  Dr. Roxy Manns was informed and instructed to leave the patient alone for now but if her heart rate was greater than 110 to put her on an amiodarone drip.  Will continue to monitor the patient. Lupita Dawn, RN

## 2014-11-11 LAB — BASIC METABOLIC PANEL
Anion gap: 6 (ref 5–15)
CO2: 28 mmol/L (ref 22–32)
Calcium: 8.4 mg/dL — ABNORMAL LOW (ref 8.9–10.3)
Chloride: 103 mmol/L (ref 101–111)
Creatinine, Ser: 0.48 mg/dL (ref 0.44–1.00)
GFR calc Af Amer: >60 mL/min (ref >60)
GFR calc non Af Amer: >60 mL/min (ref >60)
GLUCOSE: 116 mg/dL — AB (ref 65–99)
POTASSIUM: 4 mmol/L (ref 3.5–5.1)
Sodium: 137 mmol/L (ref 135–145)

## 2014-11-11 LAB — CBC
HCT: 30.4 % — ABNORMAL LOW (ref 36.0–46.0)
Hemoglobin: 9.7 g/dL — ABNORMAL LOW (ref 12.0–15.0)
MCH: 29 pg (ref 26.0–34.0)
MCHC: 31.9 g/dL (ref 30.0–36.0)
MCV: 91 fL (ref 78.0–100.0)
Platelets: 153 10*3/uL (ref 150–400)
RBC: 3.34 MIL/uL — ABNORMAL LOW (ref 3.87–5.11)
RDW: 14.2 % (ref 11.5–15.5)
WBC: 12.6 10*3/uL — ABNORMAL HIGH (ref 4.0–10.5)

## 2014-11-11 LAB — PROTIME-INR
INR: 1.36 (ref 0.00–1.49)
PROTHROMBIN TIME: 16.8 s — AB (ref 11.6–15.2)

## 2014-11-11 MED ORDER — AMIODARONE HCL 200 MG PO TABS
200.0000 mg | ORAL_TABLET | Freq: Two times a day (BID) | ORAL | Status: DC
  Administered 2014-11-11 – 2014-11-12 (×3): 200 mg via ORAL
  Filled 2014-11-11 (×6): qty 1

## 2014-11-11 MED ORDER — SODIUM CHLORIDE 0.9 % IJ SOLN
10.0000 mL | Freq: Two times a day (BID) | INTRAMUSCULAR | Status: DC
Start: 2014-11-11 — End: 2014-11-15
  Administered 2014-11-12: 10 mL

## 2014-11-11 MED ORDER — SODIUM CHLORIDE 0.9 % IJ SOLN
10.0000 mL | INTRAMUSCULAR | Status: DC | PRN
Start: 2014-11-11 — End: 2014-11-15
  Administered 2014-11-11: 20 mL

## 2014-11-11 NOTE — Progress Notes (Signed)
11/11/2014 12:54 AM Patient has converted back to sinus rhythm First degree heart block.  Will continue to monitor. Lupita Dawn, RN

## 2014-11-11 NOTE — Progress Notes (Signed)
Pt goes in and out of AFib at times.  Let next nurse know. Lupita Dawn

## 2014-11-11 NOTE — Progress Notes (Signed)
CARDIAC REHAB PHASE I   PRE:  Rate/Rhythm: 71 aflutter  BP:  Supine:   Sitting: 104/47  Standing:    SaO2: 99% 3L  MODE:  Ambulation: 300 ft   POST:  Rate/Rhythm: 90 aflutter  BP:  Supine: 123/54 Sitting:   Standing:    SaO2: 99% 3L  1407-1500 Very pleasant, eager to walk, tolerated ambulation well with assist x2 and pushing rolling walker, gait slow, steady. Ambulated on 2L O2. To bed after walk, back on 3L O2 in room, CT, pacer intact, call bell within reach.   Sol Passer, MS, ACSM CCEP

## 2014-11-11 NOTE — Progress Notes (Signed)
Dr. Roxy Manns set temporary pacemaker up VVI at 48 and I assisted pt, standby, to ambulate in hall with front wheel walker and portable O2 150 feet. She tolerated well. Assisted to chair, call bell in reach.

## 2014-11-11 NOTE — Progress Notes (Addendum)
[address]       [place] [address]             [phone]      3 Days Post-Op Procedure(s) (LRB): MINIMALLY INVASIVE MITRAL VALVE REPLACEMENT(MVR) (Right) TRANSESOPHAGEAL ECHOCARDIOGRAM (TEE) (N/A)   Subjective:  Ms. Frankowski has a few complaints this morning.  She complains of discomfort along her right chest.  She also has mild nausea and abdominal pain. She is not eating anything.  She has not moved her bowels and is not passing any gas.  Finally she has not ambulated other than to the bathroom.  Objective: Vital signs in last 24 hours: Temp:  [98 F (36.7 C)-98.8 F (37.1 C)] 98.2 F (36.8 C) (07/16 0421) Pulse Rate:  [62-91] 62 (07/16 0421) Cardiac Rhythm:  [-] Normal sinus rhythm;Heart block (07/16 0046) Resp:  [13-22] 20 (07/16 0421) BP: (96-111)/(47-61) 101/58 mmHg (07/16 0421) SpO2:  [91 %-100 %] 99 % (07/16 0421) Weight:  [126 lb 3.2 oz (57.244 kg)] 126 lb 3.2 oz (57.244 kg) (07/16 0421)  Intake/Output from previous day: 07/15 0701 - 07/16 0700 In: 740 [P.O.:720; I.V.:20] Out: 445 [Urine:195; Chest Tube:250]  General appearance: alert, cooperative and no distress Heart: irregularly irregular rhythm Lungs: clear to auscultation bilaterally Abdomen: +distended, tender to palpation, hypoactive bowel sounds appreciated Extremities: edema + bilateral hands, minimal in LE Wound: clean and dry, + eechymosis  Lab Results:  Recent Labs  11/10/14 0400 11/11/14 0525  WBC 15.6* 12.6*  HGB 10.2* 9.7*  HCT 31.7* 30.4*  PLT 152 153   BMET:  Recent Labs  11/10/14 0400 11/11/14 0525  NA 135 137  K 3.9 4.0  CL 104 103  CO2 28 28  GLUCOSE 164* 116*  BUN <5* <5*  CREATININE 0.57 0.48  CALCIUM 8.1* 8.4*    PT/INR:  Recent Labs  11/11/14 0525  LABPROT 16.8*  INR 1.36   ABG    Component Value Date/Time   PHART 7.290* 11/08/2014 2059   HCO3 22.7 11/08/2014 2059   TCO2 23 11/09/2014 1724   ACIDBASEDEF 4.0* 11/08/2014 2059   O2SAT 96.0 11/08/2014 2059   CBG (last 3)   Recent Labs  11/10/14 0340 11/10/14 0735 11/10/14 1155  GLUCAP 152* 121* 130*    Assessment/Plan: S/P Procedure(s) (LRB): MINIMALLY INVASIVE MITRAL VALVE REPLACEMENT(MVR) (Right) TRANSESOPHAGEAL ECHOCARDIOGRAM (TEE) (N/A)  1. CV- Episodes of Atrial Fibrillation/Flutter overnight, appears to be in flutter currently-rate in the 60s, on lopressor 2. INR 1.36- currently on 2.5 mg daily, will repeat tonight and if no response can increase dosage tomorrow 3. Pulm- wean oxygen as tolerated, CT remains in place 270 cc output, will leave in place today, continue IS 4. Renal- creatinine WNL, weight is up 15 lbs, may benefit from starting Lasix 5. GI- + abdominal distention, nausea/ no vomiting- no BS appreciated, concerned for early Ileus- Reglan completed 6. Dispo- patient with rate controlled A.flutter, Chest tube drainage remains high will leave in place today, concerned for developing Ileus, may need to stop narcotics however patient is using quite frequently, continue coumadin  LOS: 3 days    BARRETT, ERIN 11/11/2014  I have seen and examined the patient and agree with the assessment and plan as outlined.  Abdominal exam benign.  Will observe.  Patient now going in and out of rate-controlled Aflutter w/ brief post-termination pauses.  Pacing wires were hooked up backwards - VVI backup functioning normal now.  Will start low dose amiodarone.  Mobilize  Rexene Alberts 11/11/2014 12:26 PM

## 2014-11-11 NOTE — Progress Notes (Signed)
Attempted to initiate backup epicardial pacing, but unable to obtain proper capture. CTCS PA notified and will pass on to Dr. Roxy Manns.

## 2014-11-11 NOTE — Progress Notes (Addendum)
Pt converted to SR confirmed with EKG. At 2205 pt flipped into aflutter, but then returned to [place] with first degree  block. Pt asymptomatic and has no complaints.   Will continue to monitor.   Richarda Blade R   Pt rhythm continues to flip between afib/flutter and SR.

## 2014-11-11 NOTE — Progress Notes (Signed)
Pt has gone back into AFib with heart rate going from 60-90s.  Will continue to monitor. Lupita Dawn, RN

## 2014-11-11 NOTE — Progress Notes (Signed)
Pt's hr is labile with dips into the 30's and a 3.9 second pause. Her HR went from 80 to 33 to 70's while I am talking to her. She denies chest pain, shortness of breath, or lightheadedness.  She is very sleepy and states that she is not up to walking. I have discussed with her the hazards of immobility and will encourage increased activity while monitoring her heart rhythm and tolerance.

## 2014-11-12 LAB — PROTIME-INR
INR: 1.75 — AB (ref 0.00–1.49)
PROTHROMBIN TIME: 20.4 s — AB (ref 11.6–15.2)

## 2014-11-12 MED ORDER — PATIENT'S GUIDE TO USING COUMADIN BOOK
Freq: Once | Status: AC
  Administered 2014-11-12: 18:00:00
  Filled 2014-11-12: qty 1

## 2014-11-12 MED ORDER — WARFARIN VIDEO
Freq: Once | Status: AC
  Administered 2014-11-14: 19:00:00

## 2014-11-12 MED ORDER — PANTOPRAZOLE SODIUM 40 MG PO TBEC
40.0000 mg | DELAYED_RELEASE_TABLET | Freq: Two times a day (BID) | ORAL | Status: DC
  Administered 2014-11-12 – 2014-11-15 (×6): 40 mg via ORAL
  Filled 2014-11-12 (×6): qty 1

## 2014-11-12 MED ORDER — FUROSEMIDE 10 MG/ML IJ SOLN
40.0000 mg | Freq: Once | INTRAMUSCULAR | Status: AC
  Administered 2014-11-12: 40 mg via INTRAVENOUS
  Filled 2014-11-12: qty 4

## 2014-11-12 MED ORDER — ALUM & MAG HYDROXIDE-SIMETH 200-200-20 MG/5ML PO SUSP
15.0000 mL | Freq: Four times a day (QID) | ORAL | Status: DC | PRN
  Administered 2014-11-12 – 2014-11-14 (×3): 15 mL via ORAL
  Filled 2014-11-12 (×3): qty 30

## 2014-11-12 NOTE — Progress Notes (Signed)
Pt ambulated in hall 500 feet with front wheel walker and oxygen. She tolerated very well. Back to chair, call bell in reach.

## 2014-11-12 NOTE — Progress Notes (Addendum)
      [address]       [place] [address]             [phone]      4 Days Post-Op Procedure(s) (LRB): MINIMALLY INVASIVE MITRAL VALVE REPLACEMENT(MVR) (Right) TRANSESOPHAGEAL ECHOCARDIOGRAM (TEE) (N/A)   Subjective:  Ms. Adkison has no new complaints this morning.  She continues to have incisional discomfort.  She also continues to complain of swelling in her hands.  No BM, but is now passing gas.  Objective: Vital signs in last 24 hours: Temp:  [98.2 F (36.8 C)-99 F (37.2 C)] 98.2 F (36.8 C) (07/17 0447) Pulse Rate:  [70-93] 93 (07/17 0447) Cardiac Rhythm:  [-] Atrial flutter (07/16 2221) Resp:  [18] 18 (07/17 0447) BP: (105-123)/(51-54) 105/51 mmHg (07/16 2058) SpO2:  [99 %-100 %] 99 % (07/17 0447) Weight:  [123 lb 14.4 oz (56.2 kg)] 123 lb 14.4 oz (56.2 kg) (07/17 0500)  Intake/Output from previous day: 07/16 0701 - 07/17 0700 In: 480 [P.O.:480] Out: 780 [Urine:500; Chest Tube:280]  General appearance: alert, cooperative and no distress Heart: regular rate and rhythm Lungs: clear to auscultation bilaterally Abdomen: mild distention, non-tender, hypoactive BS Extremities: Bilateral swelling in hands, minimal to none in LE Wound: clean and dry  Lab Results:  Recent Labs  11/10/14 0400 11/11/14 0525  WBC 15.6* 12.6*  HGB 10.2* 9.7*  HCT 31.7* 30.4*  PLT 152 153   BMET:  Recent Labs  11/10/14 0400 11/11/14 0525  NA 135 137  K 3.9 4.0  CL 104 103  CO2 28 28  GLUCOSE 164* 116*  BUN <5* <5*  CREATININE 0.57 0.48  CALCIUM 8.1* 8.4*    PT/INR:  Recent Labs  11/12/14 0500  LABPROT 20.4*  INR 1.75*   ABG    Component Value Date/Time   PHART 7.290* 11/08/2014 2059   HCO3 22.7 11/08/2014 2059   TCO2 23 11/09/2014 1724   ACIDBASEDEF 4.0* 11/08/2014 2059   O2SAT 96.0 11/08/2014 2059   CBG (last 3)   Recent Labs  11/10/14 0340 11/10/14 0735 11/10/14 1155  GLUCAP 152* 121* 130*    Assessment/Plan: S/P  Procedure(s) (LRB): MINIMALLY INVASIVE MITRAL VALVE REPLACEMENT(MVR) (Right) TRANSESOPHAGEAL ECHOCARDIOGRAM (TEE) (N/A)  1. CV- episodes of A. Flutter overnight, however currently NSR- on low dose Amiodarone 2. Pulm- wean oxygen as tolerated, CT with 280 cc out yesterday (box is at 250) 3. INR 1.75- only on 2.5 mg of Coumadin, may need to decrease to 1 mg if continues to rise quickly 4. Renal- weight is up 12 lbs, will give IV Lasix today 5. GI- bowel discomfort improving, now passing gas, continue to monitor 6. Dispo- in NSR this AM, chest tube with 280cc output-will leave in place today, diurese, INR trending up continue coumadin  LOS: 4 days    BARRETT, ERIN 11/12/2014  I have seen and examined the patient and agree with the assessment and plan as outlined.  Rhythm much more stable and maintaining NSR this morning.  Chest tube output decreased but will leave tubes one more day.  Needs diuresis.  Rexene Alberts 11/12/2014 10:52 AM

## 2014-11-12 NOTE — Progress Notes (Signed)
Pt ambulated in hall 700 feet with rolling walker on room air and tolerated well.

## 2014-11-12 NOTE — Progress Notes (Signed)
Pt encouraged to ambulate and use IS by RN. Pt resistant to education and refused to walk this evening. She states she is comfortable. RN educated concerning dangers of immobility. Pt verbalized understanding.   Will continue to monitor.   Earlie Lou

## 2014-11-13 ENCOUNTER — Inpatient Hospital Stay (HOSPITAL_COMMUNITY): Payer: Medicare Other

## 2014-11-13 LAB — PROTIME-INR
INR: 2.48 — ABNORMAL HIGH (ref 0.00–1.49)
Prothrombin Time: 26.6 seconds — ABNORMAL HIGH (ref 11.6–15.2)

## 2014-11-13 MED ORDER — FUROSEMIDE 40 MG PO TABS
40.0000 mg | ORAL_TABLET | Freq: Every day | ORAL | Status: DC
  Administered 2014-11-13 – 2014-11-15 (×3): 40 mg via ORAL
  Filled 2014-11-13 (×3): qty 1

## 2014-11-13 MED ORDER — POTASSIUM CHLORIDE CRYS ER 20 MEQ PO TBCR
20.0000 meq | EXTENDED_RELEASE_TABLET | Freq: Every day | ORAL | Status: DC
  Administered 2014-11-13 – 2014-11-15 (×3): 20 meq via ORAL
  Filled 2014-11-13 (×3): qty 1

## 2014-11-13 MED ORDER — AMIODARONE HCL 200 MG PO TABS
200.0000 mg | ORAL_TABLET | Freq: Two times a day (BID) | ORAL | Status: DC
  Administered 2014-11-13 – 2014-11-15 (×5): 200 mg via ORAL
  Filled 2014-11-13 (×7): qty 1

## 2014-11-13 MED ORDER — ACETAMINOPHEN 325 MG PO TABS: 650.0000 mg | ORAL_TABLET | ORAL | Status: DC | PRN

## 2014-11-13 NOTE — Progress Notes (Signed)
Removed epicardial wires per order. 2 intact. Met resistance and RN Lexie pulled wires.  Pt tolerated procedure well.  Pt instructed to remain on bedrest for one hour.  Frequent vitals will be taken and documented. Pt resting with call bell within reach. Payton Emerald, RN

## 2014-11-13 NOTE — Progress Notes (Signed)
11/13/2014 11:53 PM Patient asked to ambulate this evening.  Patient refused saying she walked enough today.  Will try to encourage to ambulate in the AM.  Will continue to monitor patient. Lupita Dawn, RN

## 2014-11-13 NOTE — Consult Note (Signed)
   [facility] CM Inpatient Consult   11/13/2014  Elaine Le [birth year] [phone] Referral received to assess for care management services. Explained that [facility] [place] [facility] is a covered benefit of insurance.  Review information for [facility] Care Management and a folder was provided with contact information. Met with the patient regarding the benefits of [place] [facility] services. Patient states, "I realize I have been in the hospital a few times but hopefully the surgery will finally keep me out of the hospital. I pretty much feel like things will be better, now."   Explained that [place] [facility] does not interfere with or replace any services arranged by the inpatient care management staff.  Patient politely declined services with [facility] at this time.  A brochure and card with [facility] services was given.  For any questions, please contact: Natividad Brood, RN BSN [facility] Liaison  [phone] business mobile phone

## 2014-11-13 NOTE — Progress Notes (Signed)
CARDIAC REHAB PHASE I   PRE:  Rate/Rhythm: 85 SR 1HB PACs  BP:  Supine: 109/63  Sitting:   Standing:    SaO2: 100% 1L, 99%RA  MODE:  Ambulation: 840 ft   POST:  Rate/Rhythm: 109 ST 1HB  BP:  Supine:   Sitting: 129/77  Standing:    SaO2: 94% hall RA and room 1022-1053 Pt walked 840 ft on RA with rolling walker and asst x 2 with steady gait. Tolerated well. Can be asst x 1 when equipment d/ced. To bed after walk for pacing wire removal. Did well on RA. Encouraged IS.   Graylon Good, RN BSN  11/13/2014 10:50 AM

## 2014-11-13 NOTE — Progress Notes (Signed)
Patient ambulated in hallway on room air and without walker.  Patient ambulated approximately 750 feet.  Patient tolerated well.  Reminded patient to walk again this evening and she wanted to walk farther.  I explained she still needed one more walk. Pt resting with call bell within reach.  Will continue to monitor. Payton Emerald, RN

## 2014-11-13 NOTE — Progress Notes (Addendum)
[address]       [employer] [address]             [phone]      5 Days Post-Op Procedure(s) (LRB): MINIMALLY INVASIVE MITRAL VALVE REPLACEMENT(MVR) (Right) TRANSESOPHAGEAL ECHOCARDIOGRAM (TEE) (N/A) Subjective: Feels pretty well overall  Objective: Vital signs in last 24 hours: Temp:  [98.1 F (36.7 C)-98.7 F (37.1 C)] 98.7 F (37.1 C) (07/18 0453) Pulse Rate:  [81-100] 81 (07/18 0453) Cardiac Rhythm:  [-] Normal sinus rhythm (07/17 2150) Resp:  [17-20] 18 (07/18 0453) BP: (108-116)/(53-58) 108/58 mmHg (07/18 0453) SpO2:  [94 %-99 %] 99 % (07/18 0453) Weight:  [120 lb (54.432 kg)] 120 lb (54.432 kg) (07/18 0453)  Hemodynamic parameters for last 24 hours:    Intake/Output from previous day: 07/17 0701 - 07/18 0700 In: 360 [P.O.:360] Out: 3950 [Urine:3800; Chest Tube:150] Intake/Output this shift:    General appearance: alert, cooperative and no distress Heart: regular rate and rhythm and no murmur or rub Lungs: mildly dimin left base Abdomen: benign Extremities: some mild edema in hands  Wound: incis healing well  Lab Results:  Recent Labs  11/11/14 0525  WBC 12.6*  HGB 9.7*  HCT 30.4*  PLT 153   BMET:  Recent Labs  11/11/14 0525  NA 137  K 4.0  CL 103  CO2 28  GLUCOSE 116*  BUN <5*  CREATININE 0.48  CALCIUM 8.4*    PT/INR:  Recent Labs  11/13/14 0436  LABPROT 26.6*  INR 2.48*   ABG    Component Value Date/Time   PHART 7.290* 11/08/2014 2059   HCO3 22.7 11/08/2014 2059   TCO2 23 11/09/2014 1724   ACIDBASEDEF 4.0* 11/08/2014 2059   O2SAT 96.0 11/08/2014 2059   CBG (last 3)   Recent Labs  11/10/14 1155  GLUCAP 130*    Meds Scheduled Meds: . acetaminophen  1,000 mg Oral 4 times per day  . amiodarone  200 mg Oral BID WC  . antiseptic oral rinse  7 mL Mouth Rinse BID  . aspirin EC  325 mg Oral Daily  . bisacodyl  10 mg Oral Daily   Or  . bisacodyl  10 mg Rectal Daily  . chlorhexidine  15 mL  Mouth/Throat Once  . clotrimazole  1 Applicatorful Vaginal QHS  . docusate sodium  200 mg Oral Daily  . furosemide  40 mg Oral Daily  . pantoprazole  40 mg Oral BID  . potassium chloride SA  20 mEq Oral Daily  . sodium chloride  10-40 mL Intracatheter Q12H  . sodium chloride  3 mL Intravenous Q12H  . sodium chloride  3 mL Intravenous Q12H  . warfarin   Does not apply Once  . Warfarin - Physician Dosing Inpatient   Does not apply q1800   Continuous Infusions: . sodium chloride     PRN Meds:.sodium chloride, acetaminophen, alum & mag hydroxide-simeth, metoprolol, ondansetron (ZOFRAN) IV, oxyCODONE, sodium chloride, sodium chloride, sodium chloride, sodium chloride, traMADol  Xrays No results found.  Assessment/Plan: S/P Procedure(s) (LRB): MINIMALLY INVASIVE MITRAL VALVE REPLACEMENT(MVR) (Right) TRANSESOPHAGEAL ECHOCARDIOGRAM (TEE) (N/A)  1 CT drainage recorded as 220 cc- will d/c today 2 rhythm is sinus with 1 deg block- cont to monitor on amio.  3 cont gentle diuresis 4 no new labs 5 wean O2 off, push pulm toilet as able, check CXR after  Tube removed 6 INR rose a  fair amount- hold coumadin today, check CMET in am 7  routine rehab  LOS: 5 days    GOLD,WAYNE E 11/13/2014  I have seen and examined the patient and agree with the assessment and plan as outlined.  D/C pacing wires and chest tubes.  Hold coumadin today.  Continue diuresis.  Rexene Alberts 11/13/2014 8:40 AM

## 2014-11-13 NOTE — Care Management Important Message (Signed)
Important Message  Patient Details  Name: Elaine Le MRN: [id number] Date of Birth: 01-26-1961   Medicare Important Message Given:  Yes-third notification given    Pricilla Handler 11/13/2014, 2:47 PM

## 2014-11-13 NOTE — Progress Notes (Signed)
Removed Y-chest tube on right side.  Patient instructed to remain in bed until portable chest xray completed. Pt resting with call bell within reach.  Will continue to monitor.

## 2014-11-14 ENCOUNTER — Inpatient Hospital Stay (HOSPITAL_COMMUNITY): Payer: Medicare Other

## 2014-11-14 LAB — COMPREHENSIVE METABOLIC PANEL
ALT: 16 U/L (ref 14–54)
AST: 19 U/L (ref 15–41)
Albumin: 2.5 g/dL — ABNORMAL LOW (ref 3.5–5.0)
Alkaline Phosphatase: 130 U/L — ABNORMAL HIGH (ref 38–126)
Anion gap: 6 (ref 5–15)
BILIRUBIN TOTAL: 0.7 mg/dL (ref 0.3–1.2)
BUN: 5 mg/dL — AB (ref 6–20)
CO2: 28 mmol/L (ref 22–32)
CREATININE: 0.54 mg/dL (ref 0.44–1.00)
Calcium: 8.2 mg/dL — ABNORMAL LOW (ref 8.9–10.3)
Chloride: 102 mmol/L (ref 101–111)
GFR calc Af Amer: >60 mL/min (ref >60)
GFR calc non Af Amer: >60 mL/min (ref >60)
Glucose, Bld: 116 mg/dL — ABNORMAL HIGH (ref 65–99)
Potassium: 3.9 mmol/L (ref 3.5–5.1)
Sodium: 136 mmol/L (ref 135–145)
Total Protein: 5.2 g/dL — ABNORMAL LOW (ref 6.5–8.1)

## 2014-11-14 LAB — CBC
HEMATOCRIT: 30.1 % — AB (ref 36.0–46.0)
Hemoglobin: 9.7 g/dL — ABNORMAL LOW (ref 12.0–15.0)
MCH: 28.6 pg (ref 26.0–34.0)
MCHC: 32.2 g/dL (ref 30.0–36.0)
MCV: 88.8 fL (ref 78.0–100.0)
Platelets: 255 10*3/uL (ref 150–400)
RBC: 3.39 MIL/uL — AB (ref 3.87–5.11)
RDW: 14 % (ref 11.5–15.5)
WBC: 9.3 10*3/uL (ref 4.0–10.5)

## 2014-11-14 LAB — PROTIME-INR
INR: 1.61 — ABNORMAL HIGH (ref 0.00–1.49)
Prothrombin Time: 19.1 seconds — ABNORMAL HIGH (ref 11.6–15.2)

## 2014-11-14 MED ORDER — WARFARIN SODIUM 2 MG PO TABS
2.0000 mg | ORAL_TABLET | Freq: Every day | ORAL | Status: DC
  Administered 2014-11-14: 2 mg via ORAL
  Filled 2014-11-14 (×2): qty 1

## 2014-11-14 MED ORDER — WARFARIN SODIUM 2.5 MG PO TABS
2.5000 mg | ORAL_TABLET | Freq: Once | ORAL | Status: DC
  Filled 2014-11-14: qty 1

## 2014-11-14 MED ORDER — ASPIRIN EC 81 MG PO TBEC
81.0000 mg | DELAYED_RELEASE_TABLET | Freq: Every day | ORAL | Status: DC
  Administered 2014-11-15: 81 mg via ORAL
  Filled 2014-11-14: qty 1

## 2014-11-14 NOTE — Discharge Summary (Signed)
Physician Discharge Summary  Patient ID: BONNEE ZERTUCHE MRN: [id number] DOB/AGE: [birth year] 54 y.o.  Admit date: 11/08/2014 Discharge date: 11/14/2014  Admission Diagnoses: Severe mitral regurgitation  Discharge Diagnoses:  Principal Problem:   S/P minimally invasive mitral valve replacement with bioprosthetic valve Active Problems:   GERD (gastroesophageal reflux disease)   Fibromyalgia   Severe mitral regurgitation   COPD (chronic obstructive pulmonary disease)-mild   Chronic pain syndrome   Chronic diastolic heart failure   Rheumatic disease of mitral valve   Severe mitral regurgitation by prior echocardiogram  Patient Active Problem List   Diagnosis Date Noted  . S/P minimally invasive mitral valve replacement with bioprosthetic valve 11/08/2014  . Pulmonary edema 10/06/2014  . Severe mitral regurgitation by prior echocardiogram 10/06/2014  . Acute on chronic diastolic heart failure   . Chronic diastolic heart failure   . CAD- mild, non obstructive at cath 07/27/14 07/28/2014  . Chronic pain syndrome   . Tobacco abuse   . Acute congestive heart failure secondary to severe MR   . Hypoxia   . Acute on chronic respiratory failure with hypoxia   . COPD (chronic obstructive pulmonary disease)-mild 07/17/2014  . Severe mitral regurgitation 07/11/2014  . 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 Protein-calorie malnutrition, severe 07/06/2014  . Muscular deconditioning 07/05/2014  . Encephalopathy acute 07/03/2014  . Influenza A- recent hospitalization 06/29/2014  . Pleural effusion   . Community acquired pneumonia   . CN (constipation) 05/25/2014  . Hx of adenomatous colonic polyps 03/10/2011  . IBS (irritable bowel syndrome) 01/21/2011  . GERD (gastroesophageal reflux disease) 01/21/2011  . Fibromyalgia 01/21/2011   HPI:  Patient is a 54 year old female with recently discovered rheumatic mitral valve disease including severe mitral regurgitation was mild to  moderate mitral stenosis who returns to the office today for follow-up to discuss treatment options further.   The patient has no previous history of cardiac disease and had never been told that she had a heart murmur in the past. She denies any known history of rheumatic fever in the past. She has a long-standing history of tobacco abuse with suspected COPD, fibromyalgia with chronic pain, irritable bowel syndrome with chronic constipation, and chronic fatigue. She was admitted to [facility] on 06/23/2014 with acute respiratory failure with hypercapnia requiring ventilator support. She was diagnosed with influenza A and community acquired pneumonia. The patient was quite ill at the time of her initial presentation and required intravenous pressors for blood pressure support in addition to mechanical ventilation. Her subsequent hospitalization and convalescence was slow. She was initially extubated on 06/28/2014 but required reintubation later that same day. She was extubated a second time on 06/30/2014 and required BiPAP support for the next several days. A transthoracic echocardiogram was performed 07/04/2014 that revealed normal left ventricular systolic function with ejection fraction estimated 60-65%. There was a rheumatic appearing mitral valve with a "shaggy density" on the anterior leaflet of the mitral valve raising the question of possible vegetation. The patient was seen in consultation by Dr. Mare Ferrari from the cardiology team, and transesophageal echocardiogram was recommended. 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 There was no vegetation seen. Outpatient cardiology follow-up was planned. The patient was subsequently discharged from the hospital on 07/11/2014. The patient was not discharged from the hospital on any diuretics or other medications related to her  underlying heart disease. Over the subsequent 2 weeks the patient reports the  development of gradual progression of shortness of breath, orthopnea, and bilateral lower extremity edema. She also had some flulike symptoms with nonproductive cough and atypical chest pain. Symptoms became acutely worse and the patient presented to the emergency department 07/25/2014. Chest x-ray showed possible right lower lobe pneumonia but symptoms improved rapidly with intravenous diuresis and bronchodilator therapy. The patient was transferred to [facility] where she underwent left and right heart catheterization on 07/27/2014 by Dr. Angelena Form. The patient was found to have normal coronary artery anatomy with non-obstructive coronary artery disease. There was normal left ventricular systolic function with elevated left heart filling pressures and pulmonary hypertension. Cardiothoracic surgical consultation was requested. She was initially seen in consultation on 07/28/2014.   The patient is single and lives alone in [place]. She has been disabled for more than 20 years because of fibromyalgia with chronic pain and fatigue. She requires long-term narcotic pain relievers. She lives a somewhat sedentary lifestyle but she reports no particular physical limitations other than that caused by fatigue and chronic pain. She has a long-standing history of exertional shortness of breath. She reports occasional tightness across her chest that seems to develop sporadically and is not related to physical activity. She has occasional dizzy spells without palpitations or syncope. She has only recently developed severe exertional shortness of breath, PND, orthopnea, and lower extremity edema since her recent hospitalization. With diuretic therapy over the last few weeks she feels much better, although her lasix dose was increased recently due to swelling. She currently reports no shortness of breath. She can lay flat in bed. She has not  smoked any cigarettes since she was discharged from the hospital in March. She was seen by Dr. Roxy Manns in the office and was felt to be stable for proceeding with surgery.   She was admitted this hospitalization for elective minimally invasive mitral valve replacement.    Discharged Condition: good  Hospital Course: The patient underwent minimally invasive mitral valve replacement as described below. Postoperatively the patient has done quite well. She has maintained  stable hemodynamics but did have brief episode of postoperative atrial fibrillation. She has subsequently been chemically cardioverted with amiodarone and is maintaining sinus rhythm. She has been started on oral Coumadin for valve replacement. She has an expected acute blood loss anemia which has stabilized. She has some postoperative volume overload which has improved over time with direct's. Her renal function is within normal limits. She had a mild postoperative ileus which has resolved over time and is tolerating diet as well as having bowel movements. Pain has been under adequate control. Incisions are noted be healing without evidence of infection. Oxygen has been weaned and she maintains good saturations on room air. All routine lines, monitors and drainage devices have been discontinued in the standard fashion. Currently the patient is felt to be stable for tentative discharge in the next 24-48 hours pending ongoing reevaluation of her recovery.  Consults: None  Significant Diagnostic Studies: routine post op labs/xrays  Treatments: surgery:   CARDIOTHORACIC SURGERY OPERATIVE NOTE  Date of Procedure:11/08/2014  Preoperative Diagnosis:  Rheumatic Mitral Valve Disease  Severe Mitral Regurgitation  Moderate Mitral Stenosis  Postoperative Diagnosis:Same  Procedure:   Minimally-Invasive Mitral Valve Replacement Edwards Magna Mitral Bovine Bioprosthetic Tissue Valve (size  78mm, Model # [id number], serial # [id number])  Surgeon:Clarence H. Roxy Manns, MD  Assistant:Donielle Liston Alba, PA-C  Anesthesia:David Linna Caprice, MD  Operative Findings:  Rheumatic mitral valve disease with moderate mitral stenosis and mitral regurgitation  Type IIIA dysfunction  of mitral valve  Normal LV systolic function   Discharge Exam: Blood pressure 132/57, pulse 88, temperature 99.2 F (37.3 C), temperature source Oral, resp. rate 18, height 5' (1.524 m), weight 117 lb 11.6 oz (53.4 kg), SpO2 97 %.   General appearance: alert, cooperative and no distress Heart: regular rate and rhythm and soft systolic murmur Lungs: clear to auscultation bilaterally Abdomen: benign Extremities: + edema, mostly upper ext Wound: incis healing well  Disposition: 01-Home or Self Care  medications at time of discharge:   Medication List    STOP taking these medications        ALPRAZolam 0.25 MG tablet  Commonly known as:  XANAX     ibuprofen 200 MG tablet  Commonly known as:  ADVIL,MOTRIN     losartan 25 MG tablet  Commonly known as:  COZAAR      TAKE these medications        acetaminophen 325 MG tablet  Commonly known as:  TYLENOL  Take 2 tablets (650 mg total) by mouth every 4 (four) hours as needed for mild pain (temp > 101.5).     amiodarone 200 MG tablet  Commonly known as:  PACERONE  Take 1 tablet (200 mg total) by mouth 2 (two) times daily with a meal.     aspirin 81 MG EC tablet  Take 1 tablet (81 mg total) by mouth daily.     clotrimazole 1 % vaginal cream  Commonly known as:  GYNE-LOTRIMIN  Place 1 Applicatorful vaginally at bedtime.     furosemide 40 MG tablet  Commonly known as:  LASIX  Take 1 tablet (40 mg total) by mouth daily.     loratadine 10 MG tablet  Commonly known as:  CLARITIN  Take 10 mg by mouth at bedtime.     oxyCODONE 5 MG immediate release tablet  Commonly known as:  Oxy IR/ROXICODONE  Take 1-2 tablets (5-10 mg  total) by mouth every 4 (four) hours as needed for severe pain.     polyethylene glycol powder powder  Commonly known as:  GLYCOLAX  Take 255 g by mouth daily as needed.     potassium chloride SA 20 MEQ tablet  Commonly known as:  K-DUR,KLOR-CON  Take 1 tablet (20 mEq total) by mouth daily.     ranitidine 150 MG tablet  Commonly known as:  ZANTAC  TAKE 1 TABLET BY MOUTH EVERY DAY     sodium chloride 0.65 % nasal spray  Commonly known as:  OCEAN  Place 2 sprays into both nostrils every 4 (four) hours as needed for congestion.     warfarin 2.5 MG tablet  Commonly known as:  COUMADIN  Take 1 tablet (2.5 mg total) by mouth daily at 6 PM.            Follow-up Information    Follow up with Rexene Alberts, MD.   Specialty:  Cardiothoracic Surgery   Why:  12/04/2014 at 4:30 PM to see the surgeon. Please also obtain a chest x-ray at 4 PM from [facility] which is located in the same office complex.   Contact information:   [address] [place] [address] [phone]       Follow up with Warren Danes, MD.   Specialty:  Cardiology   Why:  2 weeks   Contact information:   [address] [place] [address] [phone]       Follow up with [facility]  [employer].   Specialty:  Cardiology   Why:  blood test for coumadin in 2-3 days to adjust dose   Contact information:   [address], [place] [phone]      Signed: John Giovanni 11/14/2014, 10:01 AM

## 2014-11-14 NOTE — Progress Notes (Signed)
CARDIAC REHAB PHASE I   PRE:  Rate/Rhythm: 83 SR  BP:  Supine: 115/67  Sitting:   Standing:    SaO2: 97%RA  MODE:  Ambulation: 890 ft   POST:  Rate/Rhythm: 105 ST  BP:  Supine:   Sitting: 144/56  Standing:    SaO2: 98%RA 1047-1150 Pt walked 890 ft on RA with hand held asst. Tolerated well. Education completed with pt and parents who voiced understanding. Discussed with pt to watch Coumadin and post op videos. Discussed with pt that her AIC was 6.3 and to watch carbs and make sure her Medical doctor keeps watch on it. Commended pt on quit smoking in February. Discussed CRP 2 and permission given to refer to [place] program.   Graylon Good, RN BSN  11/14/2014 11:46 AM

## 2014-11-14 NOTE — Progress Notes (Signed)
No IV access.  Central line discontinued and Dr. Roxy Manns OK without access.  Patient likely to discharge in the morning. Pt resting with call bell within reach.  Will continue to monitor. Payton Emerald, RN

## 2014-11-14 NOTE — Progress Notes (Signed)
Patient ambulated in hallway on room air three times today for approximately 900 feet each time.  Patient continues to progress.  Patient main concern is edema in right lower arm and right lower leg.  Patient concerned that "female doctors" can not see the difference in size.  She feels that no one understands that she is swollen.  Patient has been educated importance of monitoring weight and fluid intake.  Patient is mostly concerned with the amount of lasix she is not getting. Patient aware that she will get updated on medication list and any changes at discharge. Pt resting with call bell within reach.  Will continue to monitor. Elaine Emerald, RN

## 2014-11-14 NOTE — Progress Notes (Addendum)
[address]       [employer] [address]             [phone]      6 Days Post-Op Procedure(s) (LRB): MINIMALLY INVASIVE MITRAL VALVE REPLACEMENT(MVR) (Right) TRANSESOPHAGEAL ECHOCARDIOGRAM (TEE) (N/A) Subjective: Feels well, only minor incis pain  Objective: Vital signs in last 24 hours: Temp:  [98.8 F (37.1 C)-99.9 F (37.7 C)] 99.2 F (37.3 C) (07/19 0454) Pulse Rate:  [87-90] 88 (07/19 0454) Cardiac Rhythm:  [-] Normal sinus rhythm;Heart block (07/18 2030) Resp:  [18] 18 (07/19 0454) BP: (96-132)/(51-77) 132/57 mmHg (07/19 0454) SpO2:  [96 %-97 %] 97 % (07/19 0454) Weight:  [117 lb 11.6 oz (53.4 kg)] 117 lb 11.6 oz (53.4 kg) (07/19 0454)  Hemodynamic parameters for last 24 hours:    Intake/Output from previous day: 07/18 0701 - 07/19 0700 In: 610 [P.O.:610] Out: 2150 [Urine:2150] Intake/Output this shift:    General appearance: alert, cooperative and no distress Heart: regular rate and rhythm and soft systolic murmur Lungs: clear to auscultation bilaterally Abdomen: benign Extremities: some upper ext edema Wound: incis healing well  Lab Results:  Recent Labs  11/14/14 0445  WBC 9.3  HGB 9.7*  HCT 30.1*  PLT 255   BMET:  Recent Labs  11/14/14 0445  NA 136  K 3.9  CL 102  CO2 28  GLUCOSE 116*  BUN 5*  CREATININE 0.54  CALCIUM 8.2*    PT/INR:  Recent Labs  11/14/14 0445  LABPROT 19.1*  INR 1.61*   ABG    Component Value Date/Time   PHART 7.290* 11/08/2014 2059   HCO3 22.7 11/08/2014 2059   TCO2 23 11/09/2014 1724   ACIDBASEDEF 4.0* 11/08/2014 2059   O2SAT 96.0 11/08/2014 2059   CBG (last 3)  No results for input(s): GLUCAP in the last 72 hours.  Meds Scheduled Meds: . amiodarone  200 mg Oral BID WC  . antiseptic oral rinse  7 mL Mouth Rinse BID  . aspirin EC  325 mg Oral Daily  . bisacodyl  10 mg Oral Daily   Or  . bisacodyl  10 mg Rectal Daily  . chlorhexidine  15 mL Mouth/Throat Once  .  clotrimazole  1 Applicatorful Vaginal QHS  . docusate sodium  200 mg Oral Daily  . furosemide  40 mg Oral Daily  . pantoprazole  40 mg Oral BID  . potassium chloride SA  20 mEq Oral Daily  . sodium chloride  10-40 mL Intracatheter Q12H  . sodium chloride  3 mL Intravenous Q12H  . sodium chloride  3 mL Intravenous Q12H  . warfarin   Does not apply Once  . Warfarin - Physician Dosing Inpatient   Does not apply q1800   Continuous Infusions: . sodium chloride     PRN Meds:.sodium chloride, acetaminophen, alum & mag hydroxide-simeth, metoprolol, ondansetron (ZOFRAN) IV, oxyCODONE, sodium chloride, sodium chloride, sodium chloride, sodium chloride, traMADol  Xrays Dg Chest 2 View  11/14/2014   CLINICAL DATA:  Atelectasis, recent mitral valve repair.  EXAM: CHEST  2 VIEW  COMPARISON:  Portable chest x-ray of November 13, 2014  FINDINGS: The lungs are mildly hyperinflated. A tiny right apical pneumothorax persists and is stable. There is no left-sided pneumothorax. There is persistent alveolar opacity in the right mid lung. There is density at the left lung base posteriorly compatible with pleural fluid. A radiodense mitral valve ring is visible. The cardiac size is normal. The pulmonary vascularity  is not engorged. The left internal jugular venous catheter tip projects over the midportion of the SVC.  IMPRESSION: 1. Tiny stable right apical pneumothorax. Patchy alveolar opacity in the right mid lung consistent with atelectasis- pneumonia little changed since yesterday's study. 2. Bilateral pleural effusions larger on the left than on the right. The left effusion is stable. On the right the small effusion is more conspicuous than in the past.   Electronically Signed   By: David  Martinique M.D.   On: 11/14/2014 07:33   Dg Chest Port 1 View  11/13/2014   CLINICAL DATA:  Status post right-sided chest tube removal.  EXAM: PORTABLE CHEST - 1 VIEW  COMPARISON:  11/10/2014  FINDINGS: There is a tiny apical  pneumothorax in the right following chest tube removal. A linear opacity lies along the right mid to upper lung consistent with atelectasis along the tract of the removed chest tube.  Chest tube along the inferior left and right hemi thoraces has also been removed. No left pneumothorax.  There is persistent opacity at the left lung base consistent with a combination of a small effusion and atelectasis. Has mild interstitial prominence and some hazy opacity in the right mid and lower lung zone, also likely atelectasis, similar to the prior exam.  Changes from cardiac surgery and valve replacement are stable. No mediastinal widening.  Left internal jugular central venous line is unchanged.  IMPRESSION: 1. Tiny right apical pneumothorax following right-sided chest tube removal. 2. No left pneumothorax following removal of the chest tube and across the inferior right and left hemithoraces. 3. Persistent left lung base and right mid to lower lung zone opacity is likely atelectasis. Probable small left effusion. No overt pulmonary edema.   Electronically Signed   By: Lajean Manes M.D.   On: 11/13/2014 17:31    Assessment/Plan: S/P Procedure(s) (LRB): MINIMALLY INVASIVE MITRAL VALVE REPLACEMENT(MVR) (Right) TRANSESOPHAGEAL ECHOCARDIOGRAM (TEE) (N/A)  1 doing well 2 d/c central line 3 some infilt on left, stable in appearance, likely atelect.- having only minor temp increase- monitor 4 INR - dropped, will give coumadin 2.5 today 5 sinus with 1 degree block, QTc is normal- on amio 200 BID 6 cont gentle diuresis- swelling primarily upper extrem 7 poss home today or tomorrow  LOS: 6 days    GOLD,WAYNE E 11/14/2014  I have seen and examined the patient and agree with the assessment and plan as outlined.  Doing well and maintaining sinus rhythm.  Tentatively plan d/c home in am tomorrow.  Instructions given.  Coumadin 2 mg/day for now and recheck INR in am.  Rexene Alberts 11/14/2014 11:20 AM

## 2014-11-14 NOTE — Progress Notes (Signed)
Patient concerned that she is not getting enough lasix to remove excess fluid. PA Jadene Pierini called and will address with patient 11/13/14.  Explained to pt that MD did not want remove fluid too aggressively. Pt resting with call bell within reach.  Will continue to monitor. Payton Emerald, RN

## 2014-11-14 NOTE — Discharge Instructions (Signed)
Atrial Fibrillation Atrial fibrillation is a condition that causes your heart to beat irregularly. It may also cause your heart to beat faster than normal. Atrial fibrillation can prevent your heart from pumping blood normally. It increases your risk of stroke and heart problems. HOME CARE  Take medications as told by your doctor.  Only take medications that your doctor says are safe. Some medications can make the condition worse or happen again.  If blood thinners were prescribed by your doctor, take them exactly as told. Too much can cause bleeding. Too little and you will not have the needed protection against stroke and other problems.  Perform blood tests at home if told by your doctor.  Perform blood tests exactly as told by your doctor.  Do not drink alcohol.  Do not drink beverages with caffeine such as coffee, soda, and some teas.  Maintain a healthy weight.  Do not use diet pills unless your doctor says they are safe. They may make heart problems worse.  Follow diet instructions as told by your doctor.  Exercise regularly as told by your doctor.  Keep all follow-up appointments. GET HELP IF:  You notice a change in the speed, rhythm, or strength of your heartbeat.  You suddenly begin peeing (urinating) more often.  You get tired more easily when moving or exercising. GET HELP RIGHT AWAY IF:   You have chest or belly (abdominal) pain.  You feel sick to your stomach (nauseous).  You are short of breath.  You suddenly have swollen feet and ankles.  You feel dizzy.  You face, arms, or legs feel numb or weak.  There is a change in your vision or speech. MAKE SURE YOU:   Understand these instructions.  Will watch your condition.  Will get help right away if you are not doing well or get worse. Document Released: 01/22/2008 Document Revised: 08/29/2013 Document Reviewed: 05/25/2012 [facility] Patient Information 2015 [place], [place]. This information is not  intended to replace advice given to you by your health care provider. Make sure you discuss any questions you have with your health care provider. Warfarin: What You Need to Know Warfarin is an anticoagulant. Anticoagulants help prevent the formation of blood clots. They also help stop the growth of blood clots. Warfarin is sometimes referred to as a "blood thinner."  Normally, when body tissues are cut or damaged, the blood clots in order to prevent blood loss. Sometimes clots form inside your blood vessels and obstruct the flow of blood through your circulatory system (thrombosis). These clots may travel through your bloodstream and become lodged in smaller blood vessels in your brain, which can cause a stroke, or in your lungs (pulmonary embolism). WHO SHOULD USE WARFARIN? Warfarin is prescribed for people at risk of developing harmful blood clots:  People with surgically implanted mechanical heart valves, irregular heart rhythms called atrial fibrillation, and certain clotting disorders.  People who have developed harmful blood clotting in the past, including those who have had a stroke or a pulmonary embolism, or thrombosis in their legs (deep vein thrombosis [DVT]).  People with an existing blood clot, such as a pulmonary embolism. WARFARIN DOSING Warfarin tablets come in different strengths. Each tablet strength is a different color, with the amount of warfarin (in milligrams) clearly printed on the tablet. If the color of your tablet is different than usual when you receive a new prescription, report it immediately to your pharmacist or health care provider. WARFARIN MONITORING The goal of warfarin therapy is  to lessen the clotting tendency of blood but not prevent clotting completely. Your health care provider will monitor the anticoagulation effect of warfarin closely and adjust your dose as needed. For your safety, blood tests called prothrombin time (PT) or international normalized ratio  (INR) are used to measure the effects of warfarin. Both of these tests can be done with a finger stick or a blood draw. The longer it takes the blood to clot, the higher the PT or INR. Your health care provider will inform you of your "target" PT or INR range. If, at any time, your PT or INR is above the target range, there is a risk of bleeding. If your PT or INR is below the target range, there is a risk of clotting. Whether you are started on warfarin while you are in the hospital or in your health care provider's office, you will need to have your PT or INR checked within one week of starting the medicine. Initially, some people are asked to have their PT or INR checked as much as twice a week. Once you are on a stable maintenance dose, the PT or INR is checked less often, usually once every 2 to 4 weeks. The warfarin dose may be adjusted if the PT or INR is not within the target range. It is important to keep all laboratory and health care provider follow-up appointments. Not keeping appointments could result in a chronic or permanent injury, pain, or disability because warfarin is a medicine that requires close monitoring. WHAT ARE THE SIDE EFFECTS OF WARFARIN?  Too much warfarin can cause bleeding (hemorrhage) from any part of the body. This may include bleeding from the gums, blood in the urine, bloody or dark stools, a nosebleed that is not easily stopped, coughing up blood, or vomiting blood.  Too little warfarin can increase the risk of blood clots.  Too little or too much warfarin can also increase the risk of a stroke.  Warfarin use may cause a skin rash or irritation, an unusual fever, continual nausea or stomach upset, or severe pain in your joints or back. SPECIAL PRECAUTIONS WHILE TAKING WARFARIN Warfarin should be taken exactly as directed. It is very important to take warfarin as directed since bleeding or blood clots could result in chronic or permanent injury, pain, or  disability.  Take your medicine at the same time every day. If you forget to take your dose, you can take it if it is within 6 hours of when it was due.  Do not change the dose of warfarin on your own to make up for missed or extra doses.  If you miss more than 2 doses in a row, you should contact your health care provider for advice. Avoid situations that cause bleeding. You may have a tendency to bleed more easily than usual while taking warfarin. The following actions can limit bleeding:  Using a softer toothbrush.  Flossing with waxed floss rather than unwaxed floss.  Shaving with an Copy rather than a blade.  Limiting the use of sharp objects.  Avoiding potentially harmful activities, such as contact sports. Warfarin and Pregnancy or Breastfeeding  Warfarin is not advised during the first trimester of pregnancy due to an increased risk of birth defects. In certain situations, a woman may take warfarin after her first trimester of pregnancy. A woman who becomes pregnant or plans to become pregnant while taking warfarin should notify her health care provider immediately.  Although warfarin does not pass into  breast milk, a woman who wishes to breastfeed while taking warfarin should also consult with her health care provider. Alcohol, Smoking, and Illicit Drug Use  Alcohol affects how warfarin works in the body. It is best to avoid alcoholic drinks or consume very small amounts while taking warfarin. In general, alcohol intake should be limited to 1 oz (30 mL) of liquor, 6 oz (180 mL) of wine, or 12 oz (360 mL) of beer each day. Notify your health care provider if you change your alcohol intake.  Smoking affects how warfarin works. It is best to avoid smoking while taking warfarin. Notify your health care provider if you change your smoking habits.  It is best to avoid all illicit drugs while taking warfarin since there are few studies that show how warfarin interacts with  these drugs. Other Medicines and Dietary Supplements Many prescription and over-the-counter medicines can interfere with warfarin. Be sure all of your health care providers know you are taking warfarin. Notify your health care provider who prescribed warfarin for you or your pharmacist before starting or stopping any new medicines, including over-the-counter vitamins, dietary supplements, and pain medicines. Your warfarin dose may need to be adjusted. Some common over-the-counter medicines that may increase the risk of bleeding while taking warfarin include:   Acetaminophen.  Aspirin.  Nonsteroidal anti-inflammatory medicines (NSAIDs), such as ibuprofen or naproxen.  Vitamin E. Dietary Considerations  Foods that have moderate or high amounts of vitamin K can interfere with warfarin. Avoid major changes in your diet or notify your health care provider before changing your diet. Eat a consistent amount of foods that have moderate or high amounts of vitamin K. Eating less foods containing vitamin K can increase the risk of bleeding. Eating more foods containing vitamin K can increase the risk of blood clots. Additional questions about dietary considerations can be discussed with a dietitian. Foods that are very high in vitamin K:  Greens, such as Swiss chard and beet, collard, mustard, or turnip greens (fresh or frozen, cooked).  Kale (fresh or frozen, cooked).  Parsley (raw).  Spinach (cooked). Foods that are high in vitamin K:  Asparagus (frozen, cooked).  Beans, green (frozen, cooked).  Broccoli.  Bok choy (cooked).  Brussels sprouts (fresh or frozen, cooked).  Cabbage (cooked).   Coleslaw. Foods that are moderately high in vitamin K:  Blueberries.  Black-eyed peas.  Endive (raw).  Green leaf lettuce (raw).  Green scallions (raw).  Kale (raw).  Okra (frozen, cooked).  Plantains (fried).  Romaine lettuce (raw).  Sauerkraut (canned).  Spinach (raw). CALL  YOUR CLINIC OR HEALTH CARE PROVIDER IF YOU:  Plan to have any surgery or procedure.  Feel sick, especially if you have diarrhea or vomiting.  Experience or anticipate any major changes in your diet.  Start or stop a prescription or over-the-counter medicine.  Become, plan to become, or think you may be pregnant.  Are having heavier than usual menstrual periods.  Have had a fall, accident, or any symptoms of bleeding or unusual bruising.  Develop an unusual fever. CALL 911 IN THE [place] OR GO TO THE EMERGENCY DEPARTMENT IF YOU:   Think you may be having an allergic reaction to warfarin. The signs of an allergic reaction could include itching, rash, hives, swelling, chest tightness, or trouble breathing.  See signs of blood in your urine. The signs could include reddish, pinkish, or tea-colored urine.  See signs of blood in your stools. The signs could include bright red or black stools.  Vomit or cough up blood. In these instances, the blood could have either a bright red or a "coffee-grounds" appearance.  Have bleeding that will not stop after applying pressure for 30 minutes such as cuts, nosebleeds, or other injuries.  Have severe pain in your joints or back.  Have a new and severe headache.  Have sudden weakness or numbness of your face, arm, or leg, especially on one side of your body.  Have sudden confusion or trouble understanding.  Have sudden trouble seeing in one or both eyes.  Have sudden trouble walking, dizziness, loss of balance, or coordination.  Have trouble speaking or understanding (aphasia). Document Released: 04/14/2005 Document Revised: 08/29/2013 Document Reviewed: 10/08/2012 [facility] Patient Information 2015 [place], [place]. This information is not intended to replace advice given to you by your health care provider. Make sure you discuss any questions you have with your health care provider. Mitral Valve Replacement, Care After Refer to this sheet in  the next few weeks. These instructions provide you with information on caring for yourself after your procedure. Your health care provider may also give you specific instructions. Your treatment has been planned according to current medical practices, but problems sometimes occur. Call your health care provider if you have any problems or questions after your procedure.  HOME CARE INSTRUCTIONS   Take medicines only as directed by your health care provider.  Take your temperature every morning for the first 7 days after surgery. Write these down.  Weigh yourself every morning for at least 7 days after surgery. Write your weight down.  Wear elastic stockings during the day for at least 2 weeks after surgery or as directed by your health care provider. Use them longer if your ankles are swollen. The stockings help blood flow and help reduce swelling in the legs.  Take frequent naps or rest often throughout the day.  Avoid lifting more than 10 lb (4.5 kg) or pushing or pulling things with your arms for 6-8 weeks or as directed by your health care provider.  Avoid driving or airplane travel for 4-6 weeks after surgery or as directed. If you are riding in a car for an extended period, stop every 1-2 hours to stretch your legs.  Avoid crossing your legs.  Avoid climbing stairs and using the handrail to pull yourself up for the first 2-3 weeks after surgery.  Do not take baths for 2-4 weeks after surgery. Take showers once your health care provider approves. Pat incisions dry. Do not rub incisions with a washcloth or towel.  Return to work as directed by your health care provider.  Drink enough fluids to keep your urine clear or pale yellow.  Do not strain to have a bowel movement. Eat high-fiber foods if you become constipated. You may also take a medicine to help you have a bowel movement (laxative) as directed by your health care provider.  Resume sexual activity as directed by your health  care provider. SEEK MEDICAL CARE IF:   You develop a skin rash.   Your weight is increasing each day over 2-3 days.  Your weight increases by 2 or more pounds (1 kg) in a single day.  You have a fever. SEEK IMMEDIATE MEDICAL CARE IF:   You develop chest pain that is not coming from your incision.  You develop shortness of breath or difficulty breathing.  You have drainage, redness, swelling, or pain at your incision site.  You have pus coming from your incision.  You develop  light-headedness. MAKE SURE YOU:  Understand these directions.  Will watch your condition.  Will get help right away if you are not doing well or get worse. Document Released: 11/01/2004 Document Revised: 08/29/2013 Document Reviewed: 09/14/2012 [facility] Patient Information 2015 [place], [place]. This information is not intended to replace advice given to you by your health care provider. Make sure you discuss any questions you have with your health care provider.  ______________________________________________________________________  Information on my medicine - Coumadin   (Warfarin)  This medication education was reviewed with me or my healthcare representative as part of my discharge preparation.  The pharmacist that spoke with me during my hospital stay was:  Myrene Galas, [facility]  Why was Coumadin prescribed for you? Coumadin was prescribed for you because you have a blood clot or a medical condition that can cause an increased risk of forming blood clots. Blood clots can cause serious health problems by blocking the flow of blood to the heart, lung, or brain. Coumadin can prevent harmful blood clots from forming. As a reminder your indication for Coumadin is:   Stroke Prevention Because Of Atrial Fibrillation; valve replacement surgery.  What test will check on my response to Coumadin? While on Coumadin (warfarin) you will need to have an INR test regularly to ensure that your dose is keeping  you in the desired range. The INR (international normalized ratio) number is calculated from the result of the laboratory test called prothrombin time (PT).  If an INR APPOINTMENT HAS NOT ALREADY BEEN MADE FOR YOU please schedule an appointment to have this lab work done by your health care provider within 7 days. Your INR goal is usually a number between:  2 to 3 or your provider may give you a more narrow range like 2-2.5.  Ask your health care provider during an office visit what your goal INR is.  What  do you need to  know  About  COUMADIN? Take Coumadin (warfarin) exactly as prescribed by your healthcare provider about the same time each day.  DO NOT stop taking without talking to the doctor who prescribed the medication.  Stopping without other blood clot prevention medication to take the place of Coumadin may increase your risk of developing a new clot or stroke.  Get refills before you run out.  What do you do if you miss a dose? If you miss a dose, take it as soon as you remember on the same day then continue your regularly scheduled regimen the next day.  Do not take two doses of Coumadin at the same time.  Important Safety Information A possible side effect of Coumadin (Warfarin) is an increased risk of bleeding. You should call your healthcare provider right away if you experience any of the following: ? Bleeding from an injury or your nose that does not stop. ? Unusual colored urine (red or dark brown) or unusual colored stools (red or black). ? Unusual bruising for unknown reasons. ? A serious fall or if you hit your head (even if there is no bleeding).  Some foods or medicines interact with Coumadin (warfarin) and might alter your response to warfarin. To help avoid this: ? Eat a balanced diet, maintaining a consistent amount of Vitamin K. ? Notify your provider about major diet changes you plan to make. ? Avoid alcohol or limit your intake to 1 drink for women and 2 drinks for  men per day. (1 drink is 5 oz. wine, 12 oz. beer, or 1.5 oz.  liquor.)  Make sure that ANY health care provider who prescribes medication for you knows that you are taking Coumadin (warfarin).  Also make sure the healthcare provider who is monitoring your Coumadin knows when you have started a new medication including herbals and non-prescription products.  Coumadin (Warfarin)  Major Drug Interactions  Increased Warfarin Effect Decreased Warfarin Effect  Alcohol (large quantities) Antibiotics (esp. Septra/Bactrim, Flagyl, Cipro) Amiodarone (Cordarone) Aspirin (ASA) Cimetidine (Tagamet) Megestrol (Megace) NSAIDs (ibuprofen, naproxen, etc.) Piroxicam (Feldene) Propafenone (Rythmol SR) Propranolol (Inderal) Isoniazid (INH) Posaconazole (Noxafil) Barbiturates (Phenobarbital) Carbamazepine (Tegretol) Chlordiazepoxide (Librium) Cholestyramine (Questran) Griseofulvin Oral Contraceptives Rifampin Sucralfate (Carafate) Vitamin K   Coumadin (Warfarin) Major Herbal Interactions  Increased Warfarin Effect Decreased Warfarin Effect  Garlic Ginseng Ginkgo biloba Coenzyme Q10 Green tea St. Johns wort    Coumadin (Warfarin) FOOD Interactions  Eat a consistent number of servings per week of foods HIGH in Vitamin K (1 serving =  cup)  Collards (cooked, or boiled & drained) Kale (cooked, or boiled & drained) Mustard greens (cooked, or boiled & drained) Parsley *serving size only =  cup Spinach (cooked, or boiled & drained) Swiss chard (cooked, or boiled & drained) Turnip greens (cooked, or boiled & drained)  Eat a consistent number of servings per week of foods MEDIUM-HIGH in Vitamin K (1 serving = 1 cup)  Asparagus (cooked, or boiled & drained) Broccoli (cooked, boiled & drained, or raw & chopped) Brussel sprouts (cooked, or boiled & drained) *serving size only =  cup Lettuce, raw (green leaf, endive, romaine) Spinach, raw Turnip greens, raw & chopped   These websites have  more information on Coumadin (warfarin):  FailFactory.se; VeganReport.com.au;

## 2014-11-15 LAB — PROTIME-INR
INR: 1.52 — ABNORMAL HIGH (ref 0.00–1.49)
PROTHROMBIN TIME: 18.4 s — AB (ref 11.6–15.2)

## 2014-11-15 MED ORDER — FUROSEMIDE 40 MG PO TABS
40.0000 mg | ORAL_TABLET | Freq: Every day | ORAL | Status: DC
Start: 2014-11-15 — End: 2015-01-06

## 2014-11-15 MED ORDER — POTASSIUM CHLORIDE CRYS ER 20 MEQ PO TBCR: 20.0000 meq | EXTENDED_RELEASE_TABLET | Freq: Every day | ORAL | Status: DC

## 2014-11-15 MED ORDER — OXYCODONE HCL 5 MG PO TABS: 5.0000 mg | ORAL_TABLET | ORAL | Status: DC | PRN

## 2014-11-15 MED ORDER — ASPIRIN 81 MG PO TBEC: 81.0000 mg | DELAYED_RELEASE_TABLET | Freq: Every day | ORAL | Status: DC

## 2014-11-15 MED ORDER — AMIODARONE HCL 200 MG PO TABS: 200.0000 mg | ORAL_TABLET | Freq: Two times a day (BID) | ORAL | Status: DC

## 2014-11-15 MED ORDER — WARFARIN SODIUM 2.5 MG PO TABS
2.5000 mg | ORAL_TABLET | Freq: Every day | ORAL | Status: DC
Start: 2014-11-15 — End: 2014-11-22

## 2014-11-15 NOTE — Progress Notes (Addendum)
[address]       [place] [address]             [phone]      7 Days Post-Op Procedure(s) (LRB): MINIMALLY INVASIVE MITRAL VALVE REPLACEMENT(MVR) (Right) TRANSESOPHAGEAL ECHOCARDIOGRAM (TEE) (N/A) Subjective: Feels well  Objective: Vital signs in last 24 hours: Temp:  [98.7 F (37.1 C)-99.9 F (37.7 C)] 98.7 F (37.1 C) (07/20 0303) Pulse Rate:  [81-92] 81 (07/20 0303) Cardiac Rhythm:  [-] Normal sinus rhythm;Heart block (07/19 1920) Resp:  [17-18] 18 (07/20 0303) BP: (95-110)/(55-79) 110/58 mmHg (07/20 0303) SpO2:  [95 %-99 %] 97 % (07/20 0303) Weight:  [116 lb (52.617 kg)] 116 lb (52.617 kg) (07/20 0303)  Hemodynamic parameters for last 24 hours:    Intake/Output from previous day: 07/19 0701 - 07/20 0700 In: 1210 [P.O.:1210] Out: 2100 [Urine:2100] Intake/Output this shift: Total I/O In: 240 [P.O.:240] Out: 500 [Urine:500]  General appearance: alert, cooperative and no distress Heart: regular rate and rhythm and soft systolic murmur Lungs: clear to auscultation bilaterally Abdomen: benign Extremities: + edema, mostly upper ext Wound: incis healing well  Lab Results:  Recent Labs  11/14/14 0445  WBC 9.3  HGB 9.7*  HCT 30.1*  PLT 255   BMET:  Recent Labs  11/14/14 0445  NA 136  K 3.9  CL 102  CO2 28  GLUCOSE 116*  BUN 5*  CREATININE 0.54  CALCIUM 8.2*    PT/INR:  Recent Labs  11/15/14 0350  LABPROT 18.4*  INR 1.52*   ABG    Component Value Date/Time   PHART 7.290* 11/08/2014 2059   HCO3 22.7 11/08/2014 2059   TCO2 23 11/09/2014 1724   ACIDBASEDEF 4.0* 11/08/2014 2059   O2SAT 96.0 11/08/2014 2059   CBG (last 3)  No results for input(s): GLUCAP in the last 72 hours.  Meds Scheduled Meds: . amiodarone  200 mg Oral BID WC  . antiseptic oral rinse  7 mL Mouth Rinse BID  . aspirin EC  81 mg Oral Daily  . bisacodyl  10 mg Oral Daily   Or  . bisacodyl  10 mg Rectal Daily  . chlorhexidine  15 mL  Mouth/Throat Once  . clotrimazole  1 Applicatorful Vaginal QHS  . docusate sodium  200 mg Oral Daily  . furosemide  40 mg Oral Daily  . pantoprazole  40 mg Oral BID  . potassium chloride SA  20 mEq Oral Daily  . sodium chloride  10-40 mL Intracatheter Q12H  . sodium chloride  3 mL Intravenous Q12H  . sodium chloride  3 mL Intravenous Q12H  . warfarin  2 mg Oral q1800  . Warfarin - Physician Dosing Inpatient   Does not apply q1800   Continuous Infusions: . sodium chloride     PRN Meds:.sodium chloride, acetaminophen, alum & mag hydroxide-simeth, metoprolol, ondansetron (ZOFRAN) IV, oxyCODONE, sodium chloride, sodium chloride, sodium chloride, sodium chloride, traMADol  Xrays Dg Chest 2 View  11/14/2014   CLINICAL DATA:  Atelectasis, recent mitral valve repair.  EXAM: CHEST  2 VIEW  COMPARISON:  Portable chest x-ray of November 13, 2014  FINDINGS: The lungs are mildly hyperinflated. A tiny right apical pneumothorax persists and is stable. There is no left-sided pneumothorax. There is persistent alveolar opacity in the right mid lung. There is density at the left lung base posteriorly compatible with pleural fluid. A radiodense mitral valve ring is visible. The cardiac size is normal. The pulmonary vascularity is not  engorged. The left internal jugular venous catheter tip projects over the midportion of the SVC.  IMPRESSION: 1. Tiny stable right apical pneumothorax. Patchy alveolar opacity in the right mid lung consistent with atelectasis- pneumonia little changed since yesterday's study. 2. Bilateral pleural effusions larger on the left than on the right. The left effusion is stable. On the right the small effusion is more conspicuous than in the past.   Electronically Signed   By: David  Martinique M.D.   On: 11/14/2014 07:33   Dg Chest Port 1 View  11/13/2014   CLINICAL DATA:  Status post right-sided chest tube removal.  EXAM: PORTABLE CHEST - 1 VIEW  COMPARISON:  11/10/2014  FINDINGS: There is a tiny  apical pneumothorax in the right following chest tube removal. A linear opacity lies along the right mid to upper lung consistent with atelectasis along the tract of the removed chest tube.  Chest tube along the inferior left and right hemi thoraces has also been removed. No left pneumothorax.  There is persistent opacity at the left lung base consistent with a combination of a small effusion and atelectasis. Has mild interstitial prominence and some hazy opacity in the right mid and lower lung zone, also likely atelectasis, similar to the prior exam.  Changes from cardiac surgery and valve replacement are stable. No mediastinal widening.  Left internal jugular central venous line is unchanged.  IMPRESSION: 1. Tiny right apical pneumothorax following right-sided chest tube removal. 2. No left pneumothorax following removal of the chest tube and across the inferior right and left hemithoraces. 3. Persistent left lung base and right mid to lower lung zone opacity is likely atelectasis. Probable small left effusion. No overt pulmonary edema.   Electronically Signed   By: Lajean Manes M.D.   On: 11/13/2014 17:31    Assessment/Plan: S/P Procedure(s) (LRB): MINIMALLY INVASIVE MITRAL VALVE REPLACEMENT(MVR) (Right) TRANSESOPHAGEAL ECHOCARDIOGRAM (TEE) (N/A)  1 doing well, stable for d/c   LOS: 7 days    Elaine Le 11/15/2014  I have seen and examined the patient and agree with the assessment and plan as outlined.  D/C home today. Instructions given.  Elaine Le 11/15/2014 10:26 AM

## 2014-11-15 NOTE — Care Management Important Message (Signed)
Important Message  Patient Details  Name: Elaine Le MRN: [id number] Date of Birth: November 24, 1960   Medicare Important Message Given:  Yes-fourth notification given    Nathen May 11/15/2014, 10:59 AMImportant Message  Patient Details  Name: Elaine Le MRN: [id number] Date of Birth: 1961/02/25   Medicare Important Message Given:  Yes-fourth notification given    Nathen May 11/15/2014, 10:59 AM

## 2014-11-16 ENCOUNTER — Ambulatory Visit: Payer: Medicare Other | Admitting: Cardiology

## 2014-11-17 ENCOUNTER — Ambulatory Visit (INDEPENDENT_AMBULATORY_CARE_PROVIDER_SITE_OTHER): Payer: Medicare Other | Admitting: Cardiology

## 2014-11-17 ENCOUNTER — Ambulatory Visit (INDEPENDENT_AMBULATORY_CARE_PROVIDER_SITE_OTHER): Payer: Medicare Other | Admitting: Pharmacist Clinician (PhC)/ Clinical Pharmacy Specialist

## 2014-11-17 ENCOUNTER — Encounter: Payer: Self-pay | Admitting: Cardiology

## 2014-11-17 ENCOUNTER — Ambulatory Visit: Payer: Medicare Other | Admitting: Cardiology

## 2014-11-17 VITALS — BP 110/78 | HR 84 | Ht 60.0 in | Wt 109.0 lb

## 2014-11-17 DIAGNOSIS — Z953 Presence of xenogenic heart valve: Secondary | ICD-10-CM

## 2014-11-17 DIAGNOSIS — I340 Nonrheumatic mitral (valve) insufficiency: Secondary | ICD-10-CM

## 2014-11-17 DIAGNOSIS — Z7901 Long term (current) use of anticoagulants: Secondary | ICD-10-CM | POA: Insufficient documentation

## 2014-11-17 DIAGNOSIS — Z952 Presence of prosthetic heart valve: Secondary | ICD-10-CM | POA: Insufficient documentation

## 2014-11-17 DIAGNOSIS — Z954 Presence of other heart-valve replacement: Secondary | ICD-10-CM | POA: Diagnosis not present

## 2014-11-17 LAB — POCT INR: INR: 1.3

## 2014-11-17 NOTE — Patient Instructions (Signed)
Your physician recommends that you schedule a follow-up appointment in: 6 Weeks

## 2014-11-17 NOTE — Progress Notes (Signed)
Cardiology Office Note   Date:  11/17/2014   ID:  Elaine Le, DOB 1960/10/30, MRN [id number]  PCP:  Olga Millers, MD  Cardiologist:   Minus Breeding, MD   Chief Complaint  Patient presents with  . post hospital follow up    Patient has had chest pain/pressure, trouble breathing, and swelling.      History of Present Illness: Elaine Le is a 54 y.o. female who presents for follow-up after mitral valve replacement. She had probable rheumatic disease with severe regurgitation and moderate stenosis and is now status post minimally invasive valve replacement. She was discharged from the hospital a few days ago. She actually wasn't taking any pain medicines. However, she's had some back pain with inspiration. She's had a little incisional pain but she wasn't having previously. It hurts more to take a deep breath and is a superficial discomfort. She's had a mild nonproductive cough. She's not having any fevers or chills. She's not having any PND or orthopnea. She's had some mild lower extremity edema which is unchanged. She's ambulating and eating well. She's had no palpitations, presyncope or syncope. Of note she did have postoperative atrial fibrillation.  Past Medical History  Diagnosis Date  . Chronic sinusitis   . Arthritis   . Fibromyalgia   . GERD (gastroesophageal reflux disease)   . Irritable bowel syndrome (IBS)   . Headaches, cluster   . Back pain   . Fatigue   . Muscle pain   . Night sweats   . Diverticulosis   . Adenomatous colon polyp   . Internal hemorrhoids   . Hiatal hernia   . Pneumonia   . Mitral valve regurgitation     severe  . COPD (chronic obstructive pulmonary disease)-PFT pending  07/17/2014  . Community acquired pneumonia   . CN (constipation) 05/25/2014  . Acute respiratory failure with hypercapnia 06/21/2014  . Hx of adenomatous colonic polyps 03/10/2011    Oct 2012, repeat colon Oct 2017   . IBS (irritable bowel syndrome)  01/21/2011  . Influenza A 06/29/2014  . Muscular deconditioning 07/05/2014  . Pleural effusion   . Protein-calorie malnutrition, severe 07/06/2014  . Chronic pain syndrome   . Tobacco abuse   . Chronic diastolic heart failure     related to MR/MS  . 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/10/2014    Severe mitral regurgitation with moderate mitral stenosis  . Severe mitral regurgitation by prior echocardiogram 07/11/2014  . Mitral valve stenosis, moderate 07/11/2014  . Acute systolic heart failure 07/979  . S/P minimally invasive mitral valve replacement with bioprosthetic valve 11/08/2014    25 mm [facility] Mitral bovine bioprosthetic tissue valve placed via right mini thoracotomy approach    Past Surgical History  Procedure Laterality Date  . Thoracic outlet surgery    . Tubal ligation    . Vulva surgery    . Neuroplasty / transposition median nerve at carpal tunnel bilateral    . Cystostomy w/ bladder dilation      at age 2  . Tee without cardioversion N/A 07/10/2014    Procedure: TRANSESOPHAGEAL ECHOCARDIOGRAM (TEE);  Surgeon: Lelon Perla, MD;  Location: [facility] ENDOSCOPY;  Service: Cardiovascular;  55-60%. No regional wall motion modalities. Moderate mitral stenosis with severe regurgitation and moderate to severely dilated left atrium.  . Left and right heart catheterization with coronary angiogram N/A 07/27/2014    Procedure: LEFT AND RIGHT HEART CATHETERIZATION WITH CORONARY ANGIOGRAM;  Surgeon: Burnell Blanks,  MD;  Location: [place] CATH LAB;  Right left heart catheterization: Mild/minimal coronary artery disease. RV: 50/7/17, PA: 48/29 (mean 38), PCWP 33 mmHg  . Transthoracic echocardiogram  07/04/2014    Normal LV Size/Fnx - EF 60-65% no RWMA; MV: thickened leaflets with doming, fixed Posterior leaflet, anterior leaflet w/ large/shaggy & mobile density.  c/w Rheumatic Mitral disease - moderate MS & Mod-Severe MR.  . Mitral valve repair Right 11/08/2014    Procedure: MINIMALLY  INVASIVE MITRAL VALVE REPLACEMENT(MVR);  Surgeon: Rexene Alberts, MD;  Location: [place];  Service: Open Heart Surgery;  Laterality: Right;  . Tee without cardioversion N/A 11/08/2014    Procedure: TRANSESOPHAGEAL ECHOCARDIOGRAM (TEE);  Surgeon: Rexene Alberts, MD;  Location: [place];  Service: Open Heart Surgery;  Laterality: N/A;     Current Outpatient Prescriptions  Medication Sig Dispense Refill  . acetaminophen (TYLENOL) 325 MG tablet Take 2 tablets (650 mg total) by mouth every 4 (four) hours as needed for mild pain (temp > 101.5).    Marland Kitchen amiodarone (PACERONE) 200 MG tablet Take 1 tablet (200 mg total) by mouth 2 (two) times daily with a meal. 60 tablet 1  . aspirin EC 81 MG EC tablet Take 1 tablet (81 mg total) by mouth daily.    . clotrimazole (GYNE-LOTRIMIN) 1 % vaginal cream Place 1 Applicatorful vaginally at bedtime. 45 g 0  . furosemide (LASIX) 40 MG tablet Take 1 tablet (40 mg total) by mouth daily. 30 tablet 1  . loratadine (CLARITIN) 10 MG tablet Take 10 mg by mouth at bedtime.    Marland Kitchen oxyCODONE (OXY IR/ROXICODONE) 5 MG immediate release tablet Take 1-2 tablets (5-10 mg total) by mouth every 4 (four) hours as needed for severe pain. 50 tablet 0  . polyethylene glycol powder (GLYCOLAX) powder Take 255 g by mouth daily as needed. (Patient taking differently: Take 17 g by mouth daily as needed for mild constipation. ) 255 g 12  . potassium chloride SA (K-DUR,KLOR-CON) 20 MEQ tablet Take 1 tablet (20 mEq total) by mouth daily. 30 tablet 1  . ranitidine (ZANTAC) 150 MG tablet TAKE 1 TABLET BY MOUTH EVERY DAY (Patient taking differently: Take 150 mg by mouth at bedtime. ) 60 tablet 10  . sodium chloride (OCEAN) 0.65 % nasal spray Place 2 sprays into both nostrils every 4 (four) hours as needed for congestion.     Marland Kitchen warfarin (COUMADIN) 2.5 MG tablet Take 1 tablet (2.5 mg total) by mouth daily at 6 PM. (Patient taking differently: Take 2.5 mg by mouth at bedtime. TAKE AT 9PM.) 100 tablet 1  .  [DISCONTINUED] pantoprazole (PROTONIX) 40 MG tablet Take 1 tablet (40 mg total) by mouth daily. 30 tablet 11   No current facility-administered medications for this visit.    Allergies:   Morphine and related and Other    ROS:  Please see the history of present illness.   Otherwise, review of systems are positive for none.   All other systems are reviewed and negative.    PHYSICAL EXAM: VS:  BP 110/78 mmHg  Pulse 84  Ht 5' (1.524 m)  Wt 109 lb (49.442 kg)  BMI 21.29 kg/m2 , BMI Body mass index is 21.29 kg/(m^2). GENERAL:  Well appearing HEENT:  Pupils equal round and reactive, fundi not visualized, oral mucosa unremarkable NECK:  No jugular venous distention, waveform within normal limits, carotid upstroke brisk and symmetric, no bruits, no thyromegaly LYMPHATICS:  No cervical, inguinal adenopathy LUNGS:  Clear to auscultation bilaterally  BACK:  No CVA tenderness CHEST:  Her left thoracotomy wound is unremarkable. Chest tube wounds without drainage or erythema HEART:  PMI not displaced or sustained,S1 and S2 within normal limits, no S3, no S4, no clicks, no rubs, no murmurs ABD:  Flat, positive bowel sounds normal in frequency in pitch, no bruits, no rebound, no guarding, no midline pulsatile mass, no hepatomegaly, no splenomegaly EXT:  2 plus pulses throughout, trace lower extremity edema, no cyanosis no clubbing   EKG:  EKG is ordered today. The ekg ordered today demonstrates sinus rhythm, rate 84, axis within normal limits, poor anterior R wave progression, first-degree AV block, QTC prolonged, no acute ST-T wave changes.   Recent Labs: 10/08/2014: B Natriuretic Peptide 171.8* 11/09/2014: Magnesium 2.1 11/14/2014: ALT 16; BUN 5*; Creatinine, Ser 0.54; Hemoglobin 9.7*; Platelets 255; Potassium 3.9; Sodium 136    Lipid Panel    Component Value Date/Time   TRIG 115 06/28/2014 1400      Wt Readings from Last 3 Encounters:  11/17/14 109 lb (49.442 kg)  11/15/14 116 lb  (52.617 kg)  11/06/14 111 lb 3.2 oz (50.44 kg)      Other studies Reviewed: Additional studies/ records that were reviewed today include: Hospital records Review of the above records demonstrates:  Please see elsewhere in the note.     ASSESSMENT AND PLAN:  MVR:  The patient is doing relatively well postop. She has some incisional pain but her exam does not suggest recurrent effusions. I discussed when necessary pain medications. She has follow-up with Dr. Roxy Manns next week and can be reevaluated at that time but overall I think she is doing well.  ATRIAL FIB:  She's had no recurrent arrhythmia. She'll continue on the twice a day amiodarone. This could be reduced to once a day when she sees Dr. Roxy Manns and then I will consider discontinuing it completely when I see her back.   Current medicines are reviewed at length with the patient today.  The patient does not have concerns regarding medicines.  The following changes have been made:  no change  Labs/ tests ordered today include: None     Disposition:   FU with me in six weeks.     Signed, Minus Breeding, MD  11/17/2014 5:12 PM    [facility]

## 2014-11-20 ENCOUNTER — Telehealth: Payer: Self-pay | Admitting: *Deleted

## 2014-11-20 NOTE — Telephone Encounter (Signed)
Signed order was faxed back to Cardiac Rehabilitation Phase 2

## 2014-11-22 ENCOUNTER — Ambulatory Visit (INDEPENDENT_AMBULATORY_CARE_PROVIDER_SITE_OTHER): Payer: Medicare Other | Admitting: Pharmacist Clinician (PhC)/ Clinical Pharmacy Specialist

## 2014-11-22 ENCOUNTER — Other Ambulatory Visit: Payer: Self-pay | Admitting: Pharmacist Clinician (PhC)/ Clinical Pharmacy Specialist

## 2014-11-22 DIAGNOSIS — Z7901 Long term (current) use of anticoagulants: Secondary | ICD-10-CM | POA: Diagnosis not present

## 2014-11-22 DIAGNOSIS — Z953 Presence of xenogenic heart valve: Secondary | ICD-10-CM

## 2014-11-22 LAB — POCT INR: INR: 1.4

## 2014-11-22 MED ORDER — WARFARIN SODIUM 2.5 MG PO TABS: ORAL_TABLET | ORAL | Status: DC

## 2014-11-22 MED ORDER — WARFARIN SODIUM 2.5 MG PO TABS
ORAL_TABLET | ORAL | Status: DC
Start: 2014-11-22 — End: 2015-01-05

## 2014-11-27 ENCOUNTER — Encounter: Payer: Self-pay | Admitting: Internal Medicine

## 2014-11-27 ENCOUNTER — Ambulatory Visit (INDEPENDENT_AMBULATORY_CARE_PROVIDER_SITE_OTHER): Payer: Medicare Other | Admitting: Internal Medicine

## 2014-11-27 VITALS — BP 130/62 | HR 88 | Temp 98.3°F | Resp 18 | Ht 65.0 in | Wt 114.0 lb

## 2014-11-27 DIAGNOSIS — J339 Nasal polyp, unspecified: Secondary | ICD-10-CM

## 2014-11-27 DIAGNOSIS — Z954 Presence of other heart-valve replacement: Secondary | ICD-10-CM

## 2014-11-27 DIAGNOSIS — Z952 Presence of prosthetic heart valve: Secondary | ICD-10-CM

## 2014-11-27 NOTE — Patient Instructions (Addendum)
We will send you to the ENT (ear, nose, and throat) doctor to look at the nasal polyps to see if anything needs to be done with them.  Come back in about 6 months if you are doing well, if you have any problems or questions before then please feel free to call us sooner.

## 2014-11-27 NOTE — Progress Notes (Signed)
Pre visit review using our clinic review tool, if applicable. No additional management support is needed unless otherwise documented below in the visit note. 

## 2014-11-29 ENCOUNTER — Ambulatory Visit (INDEPENDENT_AMBULATORY_CARE_PROVIDER_SITE_OTHER): Payer: Medicare Other | Admitting: Pharmacist Clinician (PhC)/ Clinical Pharmacy Specialist

## 2014-11-29 DIAGNOSIS — Z953 Presence of xenogenic heart valve: Secondary | ICD-10-CM

## 2014-11-29 DIAGNOSIS — Z7901 Long term (current) use of anticoagulants: Secondary | ICD-10-CM

## 2014-11-29 LAB — POCT INR: INR: 1.5

## 2014-11-30 DIAGNOSIS — J339 Nasal polyp, unspecified: Secondary | ICD-10-CM | POA: Insufficient documentation

## 2014-11-30 NOTE — Progress Notes (Signed)
   Subjective:    Patient ID: Elaine Le, female    DOB: [birth year], 54 y.o.   MRN: [id number]  HPI The patient is a new 54 YO female coming in for nasal polyps. She had been told about them in the last year and she thinks that they are impacting her breathing well. She feels that her nose gets blocked and she struggles to breath. It is slightly better after her recent heart valve surgery. She is also concerned acutely that she is up in weight about 4 pounds in the last couple of days. She denies worsening SOB or fluid in her legs.   PMH, [facility], social history reviewed and updated.  Review of Systems  Constitutional: Negative for fever, activity change, appetite change and fatigue.  HENT: Positive for congestion. Negative for postnasal drip, rhinorrhea and sinus pressure.   Respiratory: Negative for cough, chest tightness, shortness of breath and wheezing.   Cardiovascular: Negative for chest pain, palpitations and leg swelling.  Gastrointestinal: Negative for nausea, abdominal pain, diarrhea, constipation and abdominal distention.  Musculoskeletal: Negative.   Skin: Negative.   Neurological: Negative.   Psychiatric/Behavioral: Negative.       Objective:   Physical Exam  Constitutional: She is oriented to person, place, and time. She appears well-developed and well-nourished.  HENT:  Head: Normocephalic and atraumatic.  Eyes: EOM are normal.  Neck: Normal range of motion.  Cardiovascular: Normal rate.   Valve click  Pulmonary/Chest: Effort normal and breath sounds normal.  Abdominal: Soft. Bowel sounds are normal. She exhibits no distension. There is no tenderness. There is no rebound.  Musculoskeletal: She exhibits no edema.  Neurological: She is alert and oriented to person, place, and time.  Skin: Skin is warm and dry.   Filed Vitals:   11/27/14 1503  BP: 130/62  Pulse: 88  Temp: 98.3 F (36.8 C)  TempSrc: Oral  Resp: 18  Height: 5\' 5"  (1.651 m)  Weight: 114 lb  (51.71 kg)  SpO2: 98%      Assessment & Plan:

## 2014-11-30 NOTE — Assessment & Plan Note (Signed)
Do not feel that the weight gain represents fluid but more likely return of appetite. No fluid on exam or SOB or rales. Continue to monitor closely.

## 2014-11-30 NOTE — Assessment & Plan Note (Signed)
Refer to ENT for evaluation of possibly removal.

## 2014-12-01 ENCOUNTER — Other Ambulatory Visit: Payer: Self-pay | Admitting: Thoracic Surgery (Cardiothoracic Vascular Surgery)

## 2014-12-01 DIAGNOSIS — Z953 Presence of xenogenic heart valve: Secondary | ICD-10-CM

## 2014-12-04 ENCOUNTER — Ambulatory Visit
Admission: RE | Admit: 2014-12-04 | Discharge: 2014-12-04 | Disposition: A | Discharge status: Home / Self Care | Payer: Medicare Other | Source: Ambulatory Visit | Attending: Thoracic Surgery (Cardiothoracic Vascular Surgery) | Admitting: Thoracic Surgery (Cardiothoracic Vascular Surgery)

## 2014-12-04 ENCOUNTER — Ambulatory Visit (INDEPENDENT_AMBULATORY_CARE_PROVIDER_SITE_OTHER): Payer: Self-pay | Admitting: Thoracic Surgery (Cardiothoracic Vascular Surgery)

## 2014-12-04 ENCOUNTER — Encounter: Payer: Self-pay | Admitting: Thoracic Surgery (Cardiothoracic Vascular Surgery)

## 2014-12-04 ENCOUNTER — Ambulatory Visit: Payer: Medicare Other | Admitting: Thoracic Surgery (Cardiothoracic Vascular Surgery)

## 2014-12-04 VITALS — BP 136/82 | HR 84 | Resp 20 | Ht 65.0 in | Wt 114.0 lb

## 2014-12-04 DIAGNOSIS — I5033 Acute on chronic diastolic (congestive) heart failure: Secondary | ICD-10-CM

## 2014-12-04 DIAGNOSIS — I340 Nonrheumatic mitral (valve) insufficiency: Secondary | ICD-10-CM

## 2014-12-04 DIAGNOSIS — I059 Rheumatic mitral valve disease, unspecified: Secondary | ICD-10-CM

## 2014-12-04 DIAGNOSIS — Z953 Presence of xenogenic heart valve: Secondary | ICD-10-CM

## 2014-12-04 DIAGNOSIS — I5032 Chronic diastolic (congestive) heart failure: Secondary | ICD-10-CM

## 2014-12-04 NOTE — Patient Instructions (Signed)
Decrease amiodarone to 200 mg (1 tablet) daily and stop taking it altogether when your current prescription runs out.  The patient may continue to gradually increase their physical activity as tolerated.  They should refrain from any heavy lifting or strenuous use of their arms and shoulders until at least 8 weeks from the time of their surgery, and they should avoid activities that cause increased pain in their chest on the side of their surgical incision.  Otherwise they may continue to increase their activities without any particular limitations.  The patient may return to driving an automobile as long as they are no longer requiring oral narcotic pain relievers during the daytime.  It would be wise to start driving only short distances during the daylight and gradually increase from there as they feel comfortable.

## 2014-12-04 NOTE — Progress Notes (Signed)
[address]       [place] [address]             [phone]     CARDIOTHORACIC SURGERY OFFICE NOTE  Referring Provider is Stanford Breed, Denice Bors, MD  Primary Cardiologist is Darlin Coco, MD Primary Pulmonologist is Simonne Maffucci, MD  PCP is Olga Millers, MD   HPI:  Patient returns to the office today for routine follow-up status post minimally invasive mitral valve replacement using a bioprosthetic tissue valve on 11/08/2014 for rheumatic mitral valve disease with moderate mitral stenosis and moderate to severe mitral regurgitation. Her postoperative recovery was notable for a brief episode of postoperative atrial fibrillation for which she was treated with amiodarone.  She was discharged from the hospital on the seventh postoperative day.  Since hospital discharge she has done very well. She has not taken any type of pain relievers other than Tylenol, and she states that for the last week she hasn't required any Tylenol at all. Her activity level is quite good. She already resumed driving an automobile. She wants to start mowing her lawn. She states that she still has some mild exertional shortness of breath but this does not limit her activities at all. Her appetite is good. Her prothrombin time was checked in the Coumadin clinic last week and remained subtherapeutic. Her Coumadin dose was increased at that time.   Current Outpatient Prescriptions  Medication Sig Dispense Refill  . acetaminophen (TYLENOL) 325 MG tablet Take 2 tablets (650 mg total) by mouth every 4 (four) hours as needed for mild pain (temp > 101.5).    Marland Kitchen amiodarone (PACERONE) 200 MG tablet Take 1 tablet (200 mg total) by mouth 2 (two) times daily with a meal. 60 tablet 1  . aspirin EC 81 MG EC tablet Take 1 tablet (81 mg total) by mouth daily.    . clotrimazole (GYNE-LOTRIMIN) 1 % vaginal cream Place 1 Applicatorful vaginally at bedtime. 45 g 0  . furosemide (LASIX) 40 MG tablet Take  1 tablet (40 mg total) by mouth daily. 30 tablet 1  . oxyCODONE (OXY IR/ROXICODONE) 5 MG immediate release tablet     . polyethylene glycol powder (GLYCOLAX) powder Take 255 g by mouth daily as needed. (Patient taking differently: Take 17 g by mouth daily as needed for mild constipation. ) 255 g 12  . potassium chloride SA (K-DUR,KLOR-CON) 20 MEQ tablet Take 1 tablet (20 mEq total) by mouth daily. 30 tablet 1  . ranitidine (ZANTAC) 150 MG tablet TAKE 1 TABLET BY MOUTH EVERY DAY (Patient taking differently: Take 150 mg by mouth at bedtime. ) 60 tablet 10  . sodium chloride (OCEAN) 0.65 % nasal spray Place 2 sprays into both nostrils every 4 (four) hours as needed for congestion.     Marland Kitchen warfarin (COUMADIN) 2.5 MG tablet Take 1.5 to 2 tablets by mouth daily as directed by coumadin clinic 60 tablet 3  . [DISCONTINUED] pantoprazole (PROTONIX) 40 MG tablet Take 1 tablet (40 mg total) by mouth daily. 30 tablet 11   No current facility-administered medications for this visit.      Physical Exam:   BP 136/82 mmHg  Pulse 84  Resp 20  Ht 5\' 5"  (1.651 m)  Wt 114 lb (51.71 kg)  BMI 18.97 kg/m2  SpO2 97%  General:  Well-appearing  Chest:   clear  CV:   Regular rate and rhythm without murmur  Incisions:  Clean and dry and healing nicely  Abdomen:  Soft and nontender  Extremities:  Warm and well perfused with no lower extremity edema  Diagnostic Tests:  CHEST 2 VIEW  COMPARISON: Chest x-ray of November 14, 2014  FINDINGS: The lungs are mildly hyperinflated. There is persistent density in the anterior aspect of the right middle lobe but it has decreased in conspicuity. This may be related to the course of the previous right chest tube. There is no pleural effusion or pneumothorax. The heart normal in size. The mitral valve ring is stable in appearance. There is no pulmonary vascular congestion. There is a stable surgical clip that projects over the left upper hemi thorax. The bony thorax  is unremarkable.  IMPRESSION: Interval decrease in presumed atelectasis or scarring in the right middle lobe. Continued follow-up imaging is recommended to assure that this resolves completely.   Electronically Signed  By: David Martinique M.D.  On: 12/04/2014 09:48  Impression:  Patient is doing very well less than 1 month following a minimally invasive mitral valve replacement using a bioprosthetic tissue valve.    Plan:  I have instructed the patient to decrease her dose of amiodarone to 200 mg daily. She will continue amiodarone until her current prescription runs out, at which time she will stop taking it altogether.  We have not recommended any other changes to her current medications at this time. At some point she might be able to stop taking Lasix and potassium.  I have encouraged the patient to continue to gradually increase her physical activity without any particular limitations at this time. I have cautioned her about exercise in the hot weather.  I have encouraged her to enroll in outpatient cardiac rehabilitation program.    Valentina Gu. Roxy Manns, MD 12/04/2014 10:10 AM

## 2014-12-06 ENCOUNTER — Telehealth: Payer: Self-pay | Admitting: Pharmacist Clinician (PhC)/ Clinical Pharmacy Specialist

## 2014-12-06 ENCOUNTER — Ambulatory Visit (INDEPENDENT_AMBULATORY_CARE_PROVIDER_SITE_OTHER): Payer: Medicare Other | Admitting: Pharmacist Clinician (PhC)/ Clinical Pharmacy Specialist

## 2014-12-06 DIAGNOSIS — Z953 Presence of xenogenic heart valve: Secondary | ICD-10-CM | POA: Diagnosis not present

## 2014-12-06 DIAGNOSIS — Z7901 Long term (current) use of anticoagulants: Secondary | ICD-10-CM | POA: Diagnosis not present

## 2014-12-06 LAB — POCT INR: INR: 1.9

## 2014-12-06 NOTE — Telephone Encounter (Signed)
Pt in office for INR check today.  Has complaints about increase in weight, especially abdominal.  She is up about 3 pounds to 112 in the past week.  Also notes that for the past few days she has had to sleep with more pillows.  No SOB during day, no swelling in ankles/feet and no chest tightness.  Reviewed with Kennon Portela PA, pt will take extra furosemide 40 mg tablet for 2 days, if not resolved, she will need to call office on Friday and speak with triage RN.  Pt voiced understanding.

## 2014-12-07 ENCOUNTER — Telehealth: Payer: Self-pay | Admitting: *Deleted

## 2014-12-07 NOTE — Telephone Encounter (Signed)
Cardiac Rehabilitation phase II- Early Outpatient program and Qualified medicaid Patients was faxed back to cardiac rehabilitation program at [facility].

## 2014-12-08 ENCOUNTER — Telehealth: Payer: Self-pay | Admitting: Cardiology

## 2014-12-08 NOTE — Telephone Encounter (Signed)
Ms.  Comacho is calling because she has been taking an extra lasik on Wednesday and Thursday , which she dropped weight , but now she is back up in weight and wants to know should she take another lasik or what . Please call   Thanks

## 2014-12-08 NOTE — Telephone Encounter (Signed)
I spoke with patient.  She was told on Wednesday to take 40mg  of Lasix bid due to some increased weight and orthopnea.  She weighed 112lbs.  She took the double lasix on Wednesday and Thursday.  Today she weighted 110lbs, but she reports feeling tight in her abdomen.  "It's got me around the middle."  She describes it as tightness around her lower part of her rib cage.  She comments that her abdomen feels hard to touch and it had gotten soft after the increase in her Lasix.  Her orthopnea had improved Wednesday night, but hard to tell about last night because she couldn't get comfortable (her whole right side of her body is tender/sore). She denies: Chest pain, palpitations, and lower extremity edema.   I will defer to Dr Percival Spanish.

## 2014-12-08 NOTE — Telephone Encounter (Signed)
Increase Lasix to 40 mg bid for another two days with 20 meq of potassium during those days.  Call on Monday if no improvement.

## 2014-12-08 NOTE — Telephone Encounter (Signed)
Patient notified and verbalized understanding. 

## 2014-12-11 ENCOUNTER — Ambulatory Visit: Payer: Medicare Other | Admitting: Physician Assistant

## 2014-12-11 ENCOUNTER — Telehealth: Payer: Self-pay | Admitting: Cardiology

## 2014-12-11 NOTE — Telephone Encounter (Signed)
Rec'd from [facility] forward 44 pages to Dr.Hochrien

## 2014-12-15 ENCOUNTER — Telehealth: Payer: Self-pay | Admitting: Cardiology

## 2014-12-15 NOTE — Telephone Encounter (Signed)
Received records from [facility] for appointment on 01/05/15 with Dr Percival Spanish.  Records given to [facility] (medical records) for Dr Hochrein's schedule on 01/05/15. lp

## 2014-12-18 ENCOUNTER — Ambulatory Visit (INDEPENDENT_AMBULATORY_CARE_PROVIDER_SITE_OTHER): Payer: Medicare Other | Admitting: Pharmacist Clinician (PhC)/ Clinical Pharmacy Specialist

## 2014-12-18 DIAGNOSIS — Z7901 Long term (current) use of anticoagulants: Secondary | ICD-10-CM

## 2014-12-18 DIAGNOSIS — Z953 Presence of xenogenic heart valve: Secondary | ICD-10-CM | POA: Diagnosis not present

## 2014-12-18 LAB — POCT INR: INR: 1.7

## 2014-12-21 ENCOUNTER — Encounter (HOSPITAL_COMMUNITY)
Admission: RE | Admit: 2014-12-21 | Discharge: 2014-12-21 | Disposition: A | Discharge status: Home / Self Care | Payer: Medicare Other | Source: Ambulatory Visit | Attending: Cardiology | Admitting: Cardiology

## 2014-12-21 DIAGNOSIS — Z48812 Encounter for surgical aftercare following surgery on the circulatory system: Secondary | ICD-10-CM | POA: Insufficient documentation

## 2014-12-21 DIAGNOSIS — Z952 Presence of prosthetic heart valve: Secondary | ICD-10-CM | POA: Insufficient documentation

## 2014-12-21 NOTE — Progress Notes (Signed)
Cardiac Rehab Medication Review by a Pharmacist  Does the patient  feel that his/her medications are working for him/her?  Yes, except for lasix.   Has the patient been experiencing any side effects to the medications prescribed?  no  Does the patient measure his/her own blood pressure or blood glucose at home?  no   Does the patient have any problems obtaining medications due to transportation or finances?   no  Understanding of regimen: good Understanding of indications: good Potential of compliance: good    Pharmacist comments: Pt states that her lasix may not be working. She believes that she is still holding fluid in her stomach area and the lasix does not increase her urination/fluid loss. She has not been taking it for the last 3 days and states that she has more energy.  Pt states she plans to restart the lasix today. Instructed pt to discuss fluid issues with her cardiologist and that she may need a dose increase. Told pt that cardiologist would be monitoring kidney function periodically.     Bennye Alm, PharmD Pharmacy Resident [phone]

## 2014-12-22 ENCOUNTER — Ambulatory Visit (INDEPENDENT_AMBULATORY_CARE_PROVIDER_SITE_OTHER): Payer: Medicare Other | Admitting: *Deleted

## 2014-12-22 ENCOUNTER — Telehealth: Payer: Self-pay | Admitting: Cardiology

## 2014-12-22 VITALS — BP 118/72 | HR 81 | Ht 60.0 in | Wt 112.0 lb

## 2014-12-22 DIAGNOSIS — R002 Palpitations: Secondary | ICD-10-CM | POA: Diagnosis not present

## 2014-12-22 NOTE — Telephone Encounter (Signed)
Spoke with patient who reports a pulling sensation in her chest and feels like her heart is flipping. She reports this has been going off and on for about 3 days. She had post-op AF and stopped amiodarone on 12/09/14. She felt a little twinge in her check and belched and thought this was just gas.   Advised that patient come in for EKG check today which she is agreeable to doing.   She is on warfarin.   EKG check scheduled

## 2014-12-22 NOTE — Telephone Encounter (Signed)
Pt called in wanting to know if it is normal to for her heart to feel like it is pulling and doing flips after having mitral valve replacemant. Please call  Thanks

## 2014-12-22 NOTE — Progress Notes (Signed)
Patient presented for EKG check - palpitations on and off, PAF post MVR, stopped amiodarone 12/09/14  EKG performed and reviewed by MD - sinus rhythm  No med change or change in treatment plan necessary   If patient has recurrent palpitations, she should call and let us know, as an outpatient even monitor should be ordered for patient.  Patient should take lasix as prescribed (see visit info)  Patient released as no further action was necessary

## 2014-12-25 ENCOUNTER — Encounter (HOSPITAL_COMMUNITY)
Admission: RE | Admit: 2014-12-25 | Discharge: 2014-12-25 | Disposition: A | Discharge status: Home / Self Care | Payer: Medicare Other | Source: Ambulatory Visit | Attending: Cardiology | Admitting: Cardiology

## 2014-12-25 DIAGNOSIS — Z48812 Encounter for surgical aftercare following surgery on the circulatory system: Secondary | ICD-10-CM | POA: Diagnosis not present

## 2014-12-25 DIAGNOSIS — Z952 Presence of prosthetic heart valve: Secondary | ICD-10-CM | POA: Diagnosis not present

## 2014-12-25 NOTE — Progress Notes (Signed)
Pt started cardiac rehab today.  Pt tolerated light exercise without difficulty. VSS, telemetry-Sinus rhythm with a first degree heart block this has been previously documented, asymptomatic.  Medication list reconciled.  Pt verbalized compliance with medications and denies barriers to compliance. PSYCHOSOCIAL ASSESSMENT:  PHQ-0. Pt exhibits positive coping skills, hopeful outlook with supportive family. No psychosocial needs identified at this time, no psychosocial interventions necessary.    Pt enjoys finishing furniture.   Pt cardiac rehab  goal is  Doing the right thing and to loose weight.  Pt encouraged to participate in exercising on your own and the dietary classes offered by the dietitian  to increase ability to achieve these goals.   Pt long term cardiac rehab goal is be as healthy as she can be.  Pt oriented to exercise equipment and routine.  Understanding verbalized.

## 2014-12-27 ENCOUNTER — Encounter (HOSPITAL_COMMUNITY)
Admission: RE | Admit: 2014-12-27 | Discharge: 2014-12-27 | Disposition: A | Discharge status: Home / Self Care | Payer: Medicare Other | Source: Ambulatory Visit | Attending: Cardiology | Admitting: Cardiology

## 2014-12-27 DIAGNOSIS — Z48812 Encounter for surgical aftercare following surgery on the circulatory system: Secondary | ICD-10-CM | POA: Diagnosis not present

## 2014-12-29 ENCOUNTER — Encounter (HOSPITAL_COMMUNITY)
Admission: RE | Admit: 2014-12-29 | Discharge: 2014-12-29 | Disposition: A | Discharge status: Home / Self Care | Payer: Medicare Other | Source: Ambulatory Visit | Attending: Cardiology | Admitting: Cardiology

## 2014-12-29 DIAGNOSIS — Z952 Presence of prosthetic heart valve: Secondary | ICD-10-CM | POA: Insufficient documentation

## 2014-12-29 DIAGNOSIS — Z48812 Encounter for surgical aftercare following surgery on the circulatory system: Secondary | ICD-10-CM | POA: Diagnosis not present

## 2014-12-29 NOTE — Progress Notes (Signed)
Elaine Le does not want to fill out her quality of life questionnaire.

## 2015-01-03 ENCOUNTER — Telehealth (HOSPITAL_COMMUNITY): Payer: Self-pay | Admitting: Internal Medicine

## 2015-01-03 ENCOUNTER — Encounter (HOSPITAL_COMMUNITY): Payer: Medicare Other

## 2015-01-05 ENCOUNTER — Ambulatory Visit (INDEPENDENT_AMBULATORY_CARE_PROVIDER_SITE_OTHER): Payer: Medicare Other | Admitting: Cardiology

## 2015-01-05 ENCOUNTER — Encounter: Payer: Self-pay | Admitting: Cardiology

## 2015-01-05 ENCOUNTER — Encounter (HOSPITAL_COMMUNITY): Payer: Medicare Other

## 2015-01-05 ENCOUNTER — Ambulatory Visit (INDEPENDENT_AMBULATORY_CARE_PROVIDER_SITE_OTHER): Payer: Medicare Other | Admitting: Pharmacist Clinician (PhC)/ Clinical Pharmacy Specialist

## 2015-01-05 VITALS — BP 118/76 | HR 90 | Ht 60.0 in | Wt 115.0 lb

## 2015-01-05 DIAGNOSIS — Z7901 Long term (current) use of anticoagulants: Secondary | ICD-10-CM

## 2015-01-05 DIAGNOSIS — I340 Nonrheumatic mitral (valve) insufficiency: Secondary | ICD-10-CM

## 2015-01-05 DIAGNOSIS — Z953 Presence of xenogenic heart valve: Secondary | ICD-10-CM

## 2015-01-05 DIAGNOSIS — I5033 Acute on chronic diastolic (congestive) heart failure: Secondary | ICD-10-CM | POA: Diagnosis not present

## 2015-01-05 LAB — POCT INR: INR: 1.7

## 2015-01-05 MED ORDER — WARFARIN SODIUM 5 MG PO TABS: ORAL_TABLET | ORAL | Status: DC

## 2015-01-05 NOTE — Patient Instructions (Addendum)
Your physician wants you to follow-up in: 3 Months. You will receive a reminder letter in the mail two months in advance. If you don't receive a letter, please call our office to schedule the follow-up appointment.  Your physician has recommended you make the following change in your medication: STOP Warfarin in 1 month OCTOBER 15th and INCREASE Aspirin 2 tablets (162 mg ) daily.

## 2015-01-05 NOTE — Progress Notes (Addendum)
Cardiology Office Note   Date:  01/05/2015   ID:  CLAUDIA Le, DOB 08-Feb-1961, MRN [id number]  PCP:  Olga Millers, MD  Cardiologist:   Minus Breeding, MD   Chief Complaint  Patient presents with  . 6 week follow up  . Dizziness  . Chest Pain  . Edema    right foot      History of Present Illness: Elaine Le is a 54 y.o. female who presents for follow-up after mitral valve replacement. She had probable rheumatic disease with severe regurgitation and moderate stenosis and is now status post minimally invasive valve replacement.  She is doing cardiac rehab.  She still thinks that she is retaining some fluid.  She feels like her weight is up because of this and she does have some trace ankle edema.  She has a mild uncomfortable feeling in "my heart."   She also has some mild incisional pain.  The patient denies any new shortness of breath, PND or orthopnea. There have been no reported palpitations, presyncope or syncope.  She has had no fevers or chills.    Past Medical History  Diagnosis Date  . Chronic sinusitis   . Arthritis   . Fibromyalgia   . GERD (gastroesophageal reflux disease)   . Irritable bowel syndrome (IBS)   . Headaches, cluster   . Back pain   . Fatigue   . Muscle pain   . Night sweats   . Diverticulosis   . Adenomatous colon polyp   . Internal hemorrhoids   . Hiatal hernia   . Pneumonia   . Mitral valve regurgitation     severe  . COPD (chronic obstructive pulmonary disease)-PFT pending  07/17/2014  . Community acquired pneumonia   . CN (constipation) 05/25/2014  . Acute respiratory failure with hypercapnia 06/21/2014  . Hx of adenomatous colonic polyps 03/10/2011    Oct 2012, repeat colon Oct 2017   . IBS (irritable bowel syndrome) 01/21/2011  . Influenza A 06/29/2014  . Muscular deconditioning 07/05/2014  . Pleural effusion   . Protein-calorie malnutrition, severe 07/06/2014  . Chronic pain syndrome   . Tobacco abuse   . Chronic  diastolic heart failure     related to MR/MS  . 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/10/2014    Severe mitral regurgitation with moderate mitral stenosis  . Severe mitral regurgitation by prior echocardiogram 07/11/2014  . Mitral valve stenosis, moderate 07/11/2014  . Acute systolic heart failure 05/7060  . S/P minimally invasive mitral valve replacement with bioprosthetic valve 11/08/2014    25 mm [facility] Mitral bovine bioprosthetic tissue valve placed via right mini thoracotomy approach    Past Surgical History  Procedure Laterality Date  . Thoracic outlet surgery    . Tubal ligation    . Vulva surgery    . Neuroplasty / transposition median nerve at carpal tunnel bilateral    . Cystostomy w/ bladder dilation      at age 55  . Tee without cardioversion N/A 07/10/2014    Procedure: TRANSESOPHAGEAL ECHOCARDIOGRAM (TEE);  Surgeon: Lelon Perla, MD;  Location: [facility] ENDOSCOPY;  Service: Cardiovascular;  55-60%. No regional wall motion modalities. Moderate mitral stenosis with severe regurgitation and moderate to severely dilated left atrium.  . Left and right heart catheterization with coronary angiogram N/A 07/27/2014    Procedure: LEFT AND RIGHT HEART CATHETERIZATION WITH CORONARY ANGIOGRAM;  Surgeon: Burnell Blanks, MD;  Location: [facility] CATH LAB;  Right left heart catheterization:  Mild/minimal coronary artery disease. RV: 50/7/17, PA: 48/29 (mean 38), PCWP 33 mmHg  . Transthoracic echocardiogram  07/04/2014    Normal LV Size/Fnx - EF 60-65% no RWMA; MV: thickened leaflets with doming, fixed Posterior leaflet, anterior leaflet w/ large/shaggy & mobile density.  c/w Rheumatic Mitral disease - moderate MS & Mod-Severe MR.  . Mitral valve repair Right 11/08/2014    Procedure: MINIMALLY INVASIVE MITRAL VALVE REPLACEMENT(MVR);  Surgeon: Rexene Alberts, MD;  Location: [place];  Service: Open Heart Surgery;  Laterality: Right;  . Tee without cardioversion N/A 11/08/2014    Procedure:  TRANSESOPHAGEAL ECHOCARDIOGRAM (TEE);  Surgeon: Rexene Alberts, MD;  Location: [place];  Service: Open Heart Surgery;  Laterality: N/A;     Current Outpatient Prescriptions  Medication Sig Dispense Refill  . acetaminophen (TYLENOL) 325 MG tablet Take 2 tablets (650 mg total) by mouth every 4 (four) hours as needed for mild pain (temp > 101.5).    Marland Kitchen aspirin EC 81 MG EC tablet Take 1 tablet (81 mg total) by mouth daily.    . clotrimazole (GYNE-LOTRIMIN) 1 % vaginal cream Place 1 Applicatorful vaginally at bedtime. 45 g 0  . furosemide (LASIX) 40 MG tablet Take 1 tablet (40 mg total) by mouth daily. 30 tablet 1  . oxyCODONE (OXY IR/ROXICODONE) 5 MG immediate release tablet Take 5 mg by mouth every 6 (six) hours as needed.     . polyethylene glycol powder (GLYCOLAX) powder Take 255 g by mouth daily as needed. 255 g 12  . potassium chloride SA (K-DUR,KLOR-CON) 20 MEQ tablet Take 1 tablet (20 mEq total) by mouth daily. 30 tablet 1  . ranitidine (ZANTAC) 150 MG tablet TAKE 1 TABLET BY MOUTH EVERY DAY (Patient taking differently: Take 150 mg by mouth at bedtime. ) 60 tablet 10  . sodium chloride (OCEAN) 0.65 % nasal spray Place 2 sprays into both nostrils every 4 (four) hours as needed for congestion.     Marland Kitchen warfarin (COUMADIN) 2.5 MG tablet Take 1.5 to 2 tablets by mouth daily as directed by coumadin clinic 60 tablet 3  . [DISCONTINUED] pantoprazole (PROTONIX) 40 MG tablet Take 1 tablet (40 mg total) by mouth daily. 30 tablet 11   No current facility-administered medications for this visit.    Allergies:   Morphine and related and Other    ROS:  Please see the history of present illness.   Otherwise, review of systems are positive for none.   All other systems are reviewed and negative.    PHYSICAL EXAM: VS:  Ht 5' (1.524 m)  Wt 115 lb (52.164 kg)  BMI 22.46 kg/m2 , BMI Body mass index is 22.46 kg/(m^2). GENERAL:  Well appearing HEENT:  Pupils equal round and reactive, fundi not visualized,  oral mucosa unremarkable NECK:  No jugular venous distention, waveform within normal limits, carotid upstroke brisk and symmetric, no bruits, no thyromegaly LYMPHATICS:  No cervical, inguinal adenopathy LUNGS:  Clear to auscultation bilaterally BACK:  No CVA tenderness CHEST:  Her left thoracotomy wound is healed. HEART:  PMI not displaced or sustained,S1 and S2 within normal limits, no S3, no S4, no clicks, no rubs, no murmurs ABD:  Flat, positive bowel sounds normal in frequency in pitch, no bruits, no rebound, no guarding, no midline pulsatile mass, no hepatomegaly, no splenomegaly EXT:  2 plus pulses throughout, trace lower extremity edema, no cyanosis no clubbing   EKG:  EKG is ordered today. The ekg ordered today demonstrates sinus rhythm, rate 90, axis  within normal limits, poor anterior R wave progression, first-degree AV block, QTC prolonged, no acute ST-T wave changes.   Recent Labs: 10/08/2014: B Natriuretic Peptide 171.8* 11/09/2014: Magnesium 2.1 11/14/2014: ALT 16; BUN 5*; Creatinine, Ser 0.54; Hemoglobin 9.7*; Platelets 255; Potassium 3.9; Sodium 136    Lipid Panel    Component Value Date/Time   TRIG 115 06/28/2014 1400      Wt Readings from Last 3 Encounters:  01/05/15 115 lb (52.164 kg)  12/22/14 112 lb (50.803 kg)  12/21/14 117 lb 11.6 oz (53.4 kg)      Other studies Reviewed: Additional studies/ records that were reviewed today include: Dr. Guy Sandifer office record Review of the above records demonstrates:  Please see elsewhere in the note.     ASSESSMENT AND PLAN:  MVR:  The patient will stop the wafarin in mid October.  When she does she can increase to 182 ASA per her request for prophylaxis against colon cancer.   Of note I am going to continue her lasix as she still is having some edema.   ATRIAL FIB:  She's had no recurrent arrhythmia. She is off the amiodarone.    PROLONGED QT:  She has no symptoms related to this. She should avoid further QT  prolonging drugs. I will follow-up with EKGs.   Current medicines are reviewed at length with the patient today.  The patient does not have concerns regarding medicines.  The following changes have been made:  no change  Labs/ tests ordered today include: None     Disposition:   FU with me in 4 months.    Signed, Minus Breeding, MD  01/05/2015 8:13 AM    [facility]

## 2015-01-06 ENCOUNTER — Other Ambulatory Visit: Payer: Self-pay | Admitting: Physician Assistant

## 2015-01-08 ENCOUNTER — Telehealth (HOSPITAL_COMMUNITY): Payer: Self-pay | Admitting: Internal Medicine

## 2015-01-08 ENCOUNTER — Encounter (HOSPITAL_COMMUNITY): Payer: Medicare Other

## 2015-01-09 ENCOUNTER — Telehealth: Payer: Self-pay | Admitting: Internal Medicine

## 2015-01-09 ENCOUNTER — Telehealth: Payer: Self-pay | Admitting: Pharmacist Clinician (PhC)/ Clinical Pharmacy Specialist

## 2015-01-09 NOTE — Telephone Encounter (Signed)
Agree with going to ER. Also would recommend that she call her surgeon's office.

## 2015-01-09 NOTE — Telephone Encounter (Signed)
[place] [facility] - Client [facility]  Patient Name: Elaine Le Baiza  DOB: [birth year]    Initial Comment Caller states she had surgery in July- microvalue replacement. Her abdominal is extended. Not a lot energy. Gaining weight, not from overeating.   Nurse Assessment  Nurse: Wynetta Emery, RN, Baker Janus Date/Time [name] Time): 01/09/2015 9:41:10 AM  Confirm and document reason for call. If symptomatic, describe symptoms. ---Elaine Le is post op from microvalve replacement in July 13th; She is very abd distended --taking miralax for constipation gaining 103 pound and now 115 pounds in one weeks time it has gotten up to 4 pound gain.  Has the patient traveled out of the country within the last 30 days? ---No  Does the patient require triage? ---Yes  Related visit to physician within the last 2 weeks? ---No  Does the PT have any chronic conditions? (i.e. diabetes, asthma, etc.) ---Yes  List chronic conditions. ---Microvalve replacement 7/14  Did the patient indicate they were pregnant? ---No     Guidelines    Guideline Title Affirmed Question Affirmed Notes  Chest Pain Major surgery in the past month    Final Disposition User   Go to ED Now Wynetta Emery, RN, Baker Janus    Comments  APPT vs ED outcome --patient is in no apparent distress refusing to go to ED abd distention 4 pound + gain no shortness of breath no chest pain having bowel movements APPT 01/10/2015 1100am Dr. Cathlean Cower   Referrals  REFERRED TO PCP OFFICE       Disagree/Comply: Disagree  Disagree/Comply Reason: Disagree with instructions   Comments  APPT vs ED outcome --patient is in no apparent distress refusing to go to ED abd distention 4 pound + gain no shortness of breath no chest pain having bowel movements APPT 01/10/2015 1100am Dr. Cathlean Cower  Nurse called backline and advised of patient complaint and nurse's concern advised has scheduled appt for her tomorrow -- Let her keep  her appt and please document.   Referrals  REFERRED TO PCP OFFICE       Disagree/Comply: Disagree  Disagree/Comply Reason: Disagree with instructions

## 2015-01-09 NOTE — Telephone Encounter (Signed)
Pt states she mistakenly took 2 & 1/2 tablets of warfarin instead of 1 & 1/2 last night - needs dosing instructions for tonight and going forward.

## 2015-01-09 NOTE — Telephone Encounter (Signed)
Took 12.5 mg instead of 7.5 mg yesterday.  Advised to hold today and resume tomorrow.

## 2015-01-09 NOTE — Telephone Encounter (Signed)
Pt called in stating that took the wrong dosage of the Wafarin and wanted to speak to someone about what she should do. Please f/u with pt  Thanks

## 2015-01-09 NOTE — Telephone Encounter (Signed)
LVM for pt informing of Dr Donneta Romberg message. Did not see any notes that pt has went to ED.

## 2015-01-10 ENCOUNTER — Encounter: Payer: Self-pay | Admitting: Internal Medicine

## 2015-01-10 ENCOUNTER — Other Ambulatory Visit: Payer: Self-pay | Admitting: Internal Medicine

## 2015-01-10 ENCOUNTER — Encounter (HOSPITAL_COMMUNITY): Payer: Medicare Other

## 2015-01-10 ENCOUNTER — Encounter: Payer: Self-pay | Admitting: Cardiology

## 2015-01-10 ENCOUNTER — Other Ambulatory Visit (INDEPENDENT_AMBULATORY_CARE_PROVIDER_SITE_OTHER): Payer: Medicare Other

## 2015-01-10 ENCOUNTER — Ambulatory Visit (INDEPENDENT_AMBULATORY_CARE_PROVIDER_SITE_OTHER): Payer: Medicare Other | Admitting: Internal Medicine

## 2015-01-10 VITALS — BP 124/80 | HR 98 | Temp 98.0°F | Ht 60.0 in | Wt 114.0 lb

## 2015-01-10 DIAGNOSIS — R635 Abnormal weight gain: Secondary | ICD-10-CM | POA: Diagnosis not present

## 2015-01-10 DIAGNOSIS — J432 Centrilobular emphysema: Secondary | ICD-10-CM

## 2015-01-10 DIAGNOSIS — I5032 Chronic diastolic (congestive) heart failure: Secondary | ICD-10-CM | POA: Diagnosis not present

## 2015-01-10 DIAGNOSIS — E039 Hypothyroidism, unspecified: Secondary | ICD-10-CM

## 2015-01-10 LAB — CBC WITH DIFFERENTIAL/PLATELET
BASOS ABS: 0.1 10*3/uL (ref 0.0–0.1)
Basophils Relative: 1 % (ref 0.0–3.0)
Eosinophils Absolute: 0.2 10*3/uL (ref 0.0–0.7)
Eosinophils Relative: 2.1 % (ref 0.0–5.0)
HCT: 47.1 % — ABNORMAL HIGH (ref 36.0–46.0)
Hemoglobin: 15.8 g/dL — ABNORMAL HIGH (ref 12.0–15.0)
LYMPHS ABS: 2.1 10*3/uL (ref 0.7–4.0)
Lymphocytes Relative: 25.4 % (ref 12.0–46.0)
MCHC: 33.6 g/dL (ref 30.0–36.0)
MCV: 84.7 fl (ref 78.0–100.0)
MONOS PCT: 4.6 % (ref 3.0–12.0)
Monocytes Absolute: 0.4 10*3/uL (ref 0.1–1.0)
NEUTROS ABS: 5.4 10*3/uL (ref 1.4–7.7)
NEUTROS PCT: 66.9 % (ref 43.0–77.0)
PLATELETS: 340 10*3/uL (ref 150.0–400.0)
RBC: 5.56 Mil/uL — ABNORMAL HIGH (ref 3.87–5.11)
RDW: 14.7 % (ref 11.5–15.5)
WBC: 8.1 10*3/uL (ref 4.0–10.5)

## 2015-01-10 LAB — BASIC METABOLIC PANEL
BUN: 9 mg/dL (ref 6–23)
CALCIUM: 10 mg/dL (ref 8.4–10.5)
CO2: 27 mEq/L (ref 19–32)
CREATININE: 0.7 mg/dL (ref 0.40–1.20)
Chloride: 100 mEq/L (ref 96–112)
GFR: 92.49 mL/min (ref >60.00)
GLUCOSE: 112 mg/dL — AB (ref 70–99)
Potassium: 4.3 mEq/L (ref 3.5–5.1)
Sodium: 139 mEq/L (ref 135–145)

## 2015-01-10 LAB — HEPATIC FUNCTION PANEL
ALK PHOS: 113 U/L (ref 39–117)
ALT: 27 U/L (ref 0–35)
AST: 26 U/L (ref 0–37)
Albumin: 5 g/dL (ref 3.5–5.2)
BILIRUBIN DIRECT: 0.1 mg/dL (ref 0.0–0.3)
TOTAL PROTEIN: 8.1 g/dL (ref 6.0–8.3)
Total Bilirubin: 0.5 mg/dL (ref 0.2–1.2)

## 2015-01-10 LAB — TSH: TSH: 7.56 u[IU]/mL — AB (ref 0.35–4.50)

## 2015-01-10 MED ORDER — LEVOTHYROXINE SODIUM 25 MCG PO TABS: 25.0000 ug | ORAL_TABLET | Freq: Every day | ORAL | Status: DC

## 2015-01-10 NOTE — Progress Notes (Signed)
Pre visit review using our clinic review tool, if applicable. No additional management support is needed unless otherwise documented below in the visit note. 

## 2015-01-10 NOTE — Progress Notes (Signed)
Subjective:    Patient ID: Elaine Le, female    DOB: Oct 15, 1960, 54 y.o.   MRN: [id number]  HPI  Here to f/u with suspicion and high concern for increased wt suddenly in the last few days, as wt sept 9 was 115, she was 110 at home last wk, then 116 this am by her scale at home. Points to thighs and mid abd as the location for her wt gain from fluid retention. Has overall been less active since surgury, does not think calories changed  Pt denies chest pain, increased sob or doe, wheezing, orthopnea, PND, increased LE swelling, palpitations, dizziness or syncope. Wt Readings from Last 3 Encounters:  01/10/15 114 lb (51.71 kg)  01/05/15 115 lb (52.164 kg)  12/22/14 112 lb (50.803 kg)   Past Medical History  Diagnosis Date  . Chronic sinusitis   . Arthritis   . Fibromyalgia   . GERD (gastroesophageal reflux disease)   . Irritable bowel syndrome (IBS)   . Headaches, cluster   . Back pain   . Fatigue   . Muscle pain   . Night sweats   . Diverticulosis   . Adenomatous colon polyp   . Internal hemorrhoids   . Hiatal hernia   . Pneumonia   . Mitral valve regurgitation     severe  . COPD (chronic obstructive pulmonary disease)-PFT pending  07/17/2014  . Community acquired pneumonia   . CN (constipation) 05/25/2014  . Acute respiratory failure with hypercapnia 06/21/2014  . Hx of adenomatous colonic polyps 03/10/2011    Oct 2012, repeat colon Oct 2017   . IBS (irritable bowel syndrome) 01/21/2011  . Influenza A 06/29/2014  . Muscular deconditioning 07/05/2014  . Pleural effusion   . Protein-calorie malnutrition, severe 07/06/2014  . Chronic pain syndrome   . Tobacco abuse   . Chronic diastolic heart failure     related to MR/MS  . 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/10/2014    Severe mitral regurgitation with moderate mitral stenosis  . Severe mitral regurgitation by prior echocardiogram 07/11/2014  . Mitral valve stenosis, moderate 07/11/2014  . Acute systolic heart failure  06/5359  . S/P minimally invasive mitral valve replacement with bioprosthetic valve 11/08/2014    25 mm [facility] Mitral bovine bioprosthetic tissue valve placed via right mini thoracotomy approach   Past Surgical History  Procedure Laterality Date  . Thoracic outlet surgery    . Tubal ligation    . Vulva surgery    . Neuroplasty / transposition median nerve at carpal tunnel bilateral    . Cystostomy w/ bladder dilation      at age 44  . Tee without cardioversion N/A 07/10/2014    Procedure: TRANSESOPHAGEAL ECHOCARDIOGRAM (TEE);  Surgeon: Lelon Perla, MD;  Location: [facility] ENDOSCOPY;  Service: Cardiovascular;  55-60%. No regional wall motion modalities. Moderate mitral stenosis with severe regurgitation and moderate to severely dilated left atrium.  . Left and right heart catheterization with coronary angiogram N/A 07/27/2014    Procedure: LEFT AND RIGHT HEART CATHETERIZATION WITH CORONARY ANGIOGRAM;  Surgeon: Burnell Blanks, MD;  Location: [facility] CATH LAB;  Right left heart catheterization: Mild/minimal coronary artery disease. RV: 50/7/17, PA: 48/29 (mean 38), PCWP 33 mmHg  . Transthoracic echocardiogram  07/04/2014    Normal LV Size/Fnx - EF 60-65% no RWMA; MV: thickened leaflets with doming, fixed Posterior leaflet, anterior leaflet w/ large/shaggy & mobile density.  c/w Rheumatic Mitral disease - moderate MS & Mod-Severe MR.  . Mitral  valve repair Right 11/08/2014    Procedure: MINIMALLY INVASIVE MITRAL VALVE REPLACEMENT(MVR);  Surgeon: Rexene Alberts, MD;  Location: [place];  Service: Open Heart Surgery;  Laterality: Right;  . Tee without cardioversion N/A 11/08/2014    Procedure: TRANSESOPHAGEAL ECHOCARDIOGRAM (TEE);  Surgeon: Rexene Alberts, MD;  Location: [place];  Service: Open Heart Surgery;  Laterality: N/A;    reports that she quit smoking about 6 months ago. Her smoking use included Cigarettes. She has a 17.5 pack-year smoking history. She has never used smokeless tobacco. She  reports that she does not drink alcohol or use illicit drugs. family history includes Colon polyps in her mother; Diabetes in her brother and sister; Irritable bowel syndrome in her daughter and mother; Kidney cancer in her mother; Liver disease in her sister. There is no history of Colon cancer. Allergies  Allergen Reactions  . Morphine And Related Other (See Comments)    Altered mental state  . Other Other (See Comments)    Any antidepressants per pt- causes altered mental state, anger   Current Outpatient Prescriptions on File Prior to Visit  Medication Sig Dispense Refill  . acetaminophen (TYLENOL) 325 MG tablet Take 2 tablets (650 mg total) by mouth every 4 (four) hours as needed for mild pain (temp > 101.5).    Marland Kitchen aspirin EC 81 MG EC tablet Take 1 tablet (81 mg total) by mouth daily.    . clotrimazole (GYNE-LOTRIMIN) 1 % vaginal cream Place 1 Applicatorful vaginally at bedtime. 45 g 0  . furosemide (LASIX) 40 MG tablet TAKE 1 TABLET BY MOUTH ONCE DAILY 30 tablet 1  . oxyCODONE (OXY IR/ROXICODONE) 5 MG immediate release tablet Take 5 mg by mouth every 6 (six) hours as needed.     . polyethylene glycol powder (GLYCOLAX) powder Take 255 g by mouth daily as needed. 255 g 12  . potassium chloride SA (K-DUR,KLOR-CON) 20 MEQ tablet Take 1 tablet (20 mEq total) by mouth daily. 30 tablet 1  . ranitidine (ZANTAC) 150 MG tablet TAKE 1 TABLET BY MOUTH EVERY DAY (Patient taking differently: Take 150 mg by mouth at bedtime. ) 60 tablet 10  . sodium chloride (OCEAN) 0.65 % nasal spray Place 2 sprays into both nostrils every 4 (four) hours as needed for congestion.     Marland Kitchen warfarin (COUMADIN) 5 MG tablet Take 1 to 1.5 tablets by mouth daily as directed by coumadin clinic 40 tablet 1  . [DISCONTINUED] pantoprazole (PROTONIX) 40 MG tablet Take 1 tablet (40 mg total) by mouth daily. 30 tablet 11   No current facility-administered medications on file prior to visit.   Review of Systems  Constitutional:  Negative for unusual diaphoresis or night sweats HENT: Negative for ringing in ear or discharge Eyes: Negative for double vision or worsening visual disturbance.  Respiratory: Negative for choking and stridor.   Gastrointestinal: Negative for vomiting or other signifcant bowel change Genitourinary: Negative for hematuria or change in urine volume.  Musculoskeletal: Negative for other MSK pain or swelling Skin: Negative for color change and worsening wound.  Neurological: Negative for tremors and numbness other than noted  Psychiatric/Behavioral: Negative for decreased concentration or agitation other than above       Objective:   Physical Exam BP 124/80 mmHg  Pulse 98  Temp(Src) 98 F (36.7 C) (Oral)  Ht 5' (1.524 m)  Wt 114 lb (51.71 kg)  BMI 22.26 kg/m2  SpO2 98% VS noted,  Constitutional: Pt appears in no significant distress HENT: Head:  NCAT.  Right Ear: External ear normal.  Left Ear: External ear normal.  Eyes: . Pupils are equal, round, and reactive to light. Conjunctivae and EOM are normal Neck: Normal range of motion. Neck supple.  Cardiovascular: Normal rate and regular rhythm.   Pulmonary/Chest: Effort normal and breath sounds without rales or wheezing.  Abd:  Soft, NT, ND, + BS with mild adiposity noted Neurological: Pt is alert. Not confused , motor grossly intact Skin: Skin is warm. No rash, no LE edema Psychiatric: Pt behavior is normal. No agitation.     Assessment & Plan:

## 2015-01-10 NOTE — Patient Instructions (Signed)

## 2015-01-11 ENCOUNTER — Ambulatory Visit: Payer: Medicare Other | Admitting: Internal Medicine

## 2015-01-12 ENCOUNTER — Encounter (HOSPITAL_COMMUNITY): Payer: Medicare Other

## 2015-01-14 NOTE — Assessment & Plan Note (Signed)
stable overall by history and exam, and pt to continue medical treatment as before,  to f/u any worsening symptoms or concerns 

## 2015-01-14 NOTE — Assessment & Plan Note (Signed)
No evidence for volume overload today, wt gain of several lbs appears to be adiposity increase.  D/w pt, reassurance, for increased activity as she is able

## 2015-01-14 NOTE — Assessment & Plan Note (Signed)
stable overall by history and exam, recent data reviewed with pt, and pt to continue medical treatment as before,  to f/u any worsening symptoms or concerns SpO2 Readings from Last 3 Encounters:  01/10/15 98%  12/21/14 98%  12/04/14 97%  \ To quit smoking

## 2015-01-15 ENCOUNTER — Encounter (HOSPITAL_COMMUNITY): Payer: Medicare Other

## 2015-01-15 ENCOUNTER — Encounter: Payer: Self-pay | Admitting: Thoracic Surgery (Cardiothoracic Vascular Surgery)

## 2015-01-15 ENCOUNTER — Ambulatory Visit (INDEPENDENT_AMBULATORY_CARE_PROVIDER_SITE_OTHER): Payer: Self-pay | Admitting: Thoracic Surgery (Cardiothoracic Vascular Surgery)

## 2015-01-15 ENCOUNTER — Other Ambulatory Visit: Payer: Self-pay | Admitting: *Deleted

## 2015-01-15 ENCOUNTER — Ambulatory Visit (INDEPENDENT_AMBULATORY_CARE_PROVIDER_SITE_OTHER): Payer: Medicare Other | Admitting: Pharmacist Clinician (PhC)/ Clinical Pharmacy Specialist

## 2015-01-15 VITALS — BP 137/83 | HR 94 | Resp 16 | Ht 60.0 in | Wt 114.0 lb

## 2015-01-15 DIAGNOSIS — Z953 Presence of xenogenic heart valve: Secondary | ICD-10-CM

## 2015-01-15 DIAGNOSIS — Z7901 Long term (current) use of anticoagulants: Secondary | ICD-10-CM

## 2015-01-15 DIAGNOSIS — I059 Rheumatic mitral valve disease, unspecified: Secondary | ICD-10-CM

## 2015-01-15 LAB — POCT INR: INR: 1.6

## 2015-01-15 MED ORDER — ASPIRIN 81 MG PO TBEC: 325.0000 mg | DELAYED_RELEASE_TABLET | Freq: Every day | ORAL | Status: DC

## 2015-01-15 NOTE — Patient Instructions (Addendum)
You may resume unrestricted physical activity without any particular limitations at this time.  Stop taking Coumadin (warfarin) and increase your dose of aspirin to 325 mg daily  Call your primary care physician if your rash doesn't resolve after you stop taking Coumadin.  Decrease lasix to 20 mg daily for one week then stop lasix altogether if you do not develop signs of fluid retention or significant weight gain.  Stop taking potassium when you stop taking lasix.  Endocarditis is a potentially serious infection of heart valves or inside lining of the heart.  It occurs more commonly in patients with diseased heart valves (such as patient's with aortic or mitral valve disease) and in patients who have undergone heart valve repair or replacement.  Certain surgical and dental procedures may put you at risk, such as dental cleaning, other dental procedures, or any surgery involving the respiratory, urinary, gastrointestinal tract, gallbladder or prostate gland.   To minimize your chances for develooping endocarditis, maintain good oral health and seek prompt medical attention for any infections involving the mouth, teeth, gums, skin or urinary tract.    Always notify your doctor or dentist about your underlying heart valve condition before having any invasive procedures. You will need to take antibiotics before certain procedures, including all routine dental cleanings or other dental procedures.  Your cardiologist or dentist should prescribe these antibiotics for you to be taken ahead of time.

## 2015-01-15 NOTE — Progress Notes (Signed)
[address]       [place] [address]             [phone]     CARDIOTHORACIC SURGERY OFFICE NOTE  Referring Provider is Stanford Breed, Denice Bors, MD  Primary Cardiologist is Minus Breeding, MD Primary Pulmonologist is Simonne Maffucci, MD  PCP is Olga Millers, MD   HPI:  Patient returns to the office today for routine follow-up status post minimally invasive mitral valve replacement using a bioprosthetic tissue valve on 11/08/2014 for rheumatic mitral valve disease with moderate mitral stenosis and moderate to severe mitral regurgitation. She was last seen here in our office on 12/04/2014 at which time she was doing well.  She was seen in follow-up by Dr. Percival Spanish on 01/05/2015 and she returns to our office for routine follow-up today. She reports that overall she feels pretty well. She still has very mild soreness in her right chest related to her surgical procedure, but this does not bother her significantly at all. She has no shortness of breath. She worries about her weight which seems to fluctuate upper down a couple pounds at a time, but she has never had any shortness of breath, lower extremity edema, or other signs of fluid overload. She states that at times her stomach feels bloated. She was recently started on Synthroid by Dr. Jenny Reichmann although she has not started taking this medication because she wanted to discuss it with [place] here in our office to make sure it was okay. She has developed a rash in both lower extremities that is not associated with fevers, chills, or itchiness. She stopped taking amiodarone more than a month ago.  She has not had any problems managing Coumadin. She denies any palpitations.  Current Outpatient Prescriptions  Medication Sig Dispense Refill  . acetaminophen (TYLENOL) 325 MG tablet Take 2 tablets (650 mg total) by mouth every 4 (four) hours as needed for mild pain (temp > 101.5).    Marland Kitchen aspirin EC 81 MG EC tablet Take 1 tablet (81  mg total) by mouth daily.    . clotrimazole (GYNE-LOTRIMIN) 1 % vaginal cream Place 1 Applicatorful vaginally at bedtime. 45 g 0  . furosemide (LASIX) 40 MG tablet TAKE 1 TABLET BY MOUTH ONCE DAILY 30 tablet 1  . levothyroxine (LEVOTHROID) 25 MCG tablet Take 1 tablet (25 mcg total) by mouth daily before breakfast. 90 tablet 3  . polyethylene glycol powder (GLYCOLAX) powder Take 255 g by mouth daily as needed. 255 g 12  . potassium chloride SA (K-DUR,KLOR-CON) 20 MEQ tablet Take 1 tablet (20 mEq total) by mouth daily. 30 tablet 1  . ranitidine (ZANTAC) 150 MG tablet TAKE 1 TABLET BY MOUTH EVERY DAY (Patient taking differently: Take 150 mg by mouth at bedtime. ) 60 tablet 10  . sodium chloride (OCEAN) 0.65 % nasal spray Place 2 sprays into both nostrils every 4 (four) hours as needed for congestion.     Marland Kitchen warfarin (COUMADIN) 5 MG tablet Take 1 to 1.5 tablets by mouth daily as directed by coumadin clinic 40 tablet 1  . [DISCONTINUED] pantoprazole (PROTONIX) 40 MG tablet Take 1 tablet (40 mg total) by mouth daily. 30 tablet 11   No current facility-administered medications for this visit.      Physical Exam:   BP 137/83 mmHg  Pulse 94  Resp 16  Ht 5' (1.524 m)  Wt 114 lb (51.71 kg)  BMI 22.26 kg/m2  SpO2 99%  General:  Well-appearing  Chest:   Clear with symmetrical breath sounds  CV:   Regular rate and rhythm without murmur  Incisions:  Well-healed  Abdomen:  Soft and nontender  Extremities:  Warm and well-perfused. Patient has a mild fine erythematous papular rash on both lower legs that appears suggestive of some type of drug reaction.  Diagnostic Tests:  n/a   Impression:  Patient is doing well 2 months following minimally invasive mitral valve replacement using a bioprosthetic tissue valve. She has developed a fine rash on both lower legs that appears consistent with some type of drug reaction. Based upon her current list of medications I would suspect that Coumadin might be  the most likely culprit.  Plan:  I have instructed the patient to stop taking Coumadin and increase her dose of aspirin to 325 mg daily. She is now more than 2 months out from her surgery and has been maintaining sinus rhythm. I have also suggested that the patient might not need to remain on Lasix and potassium indefinitely. I have suggested that she consider cutting her dose in half and then perhaps stopping it altogether if she does not develop any signs of fluid excess. I've encouraged the patient to increase her physical activity as tolerated without any particular limitations. She has been reminded that she will require antibiotic prophylaxis for all dental cleanings and related procedures.  We will obtain a routine follow-up echocardiogram. The patient will return to see [place] next July, approximately 1 year following her original surgery.  During the interim period of time she will call and return to see [place] as needed.   Valentina Gu. Roxy Manns, MD 01/15/2015 10:58 AM

## 2015-01-17 ENCOUNTER — Other Ambulatory Visit: Payer: Self-pay

## 2015-01-17 ENCOUNTER — Ambulatory Visit (HOSPITAL_COMMUNITY): Payer: Medicare Other | Attending: Internal Medicine

## 2015-01-17 ENCOUNTER — Encounter (HOSPITAL_COMMUNITY)
Admission: RE | Admit: 2015-01-17 | Discharge: 2015-01-17 | Disposition: A | Discharge status: Home / Self Care | Payer: Medicare Other | Source: Ambulatory Visit | Attending: Cardiology | Admitting: Cardiology

## 2015-01-17 DIAGNOSIS — I358 Other nonrheumatic aortic valve disorders: Secondary | ICD-10-CM | POA: Diagnosis not present

## 2015-01-17 DIAGNOSIS — Z48812 Encounter for surgical aftercare following surgery on the circulatory system: Secondary | ICD-10-CM | POA: Diagnosis not present

## 2015-01-17 DIAGNOSIS — I059 Rheumatic mitral valve disease, unspecified: Secondary | ICD-10-CM | POA: Insufficient documentation

## 2015-01-17 DIAGNOSIS — Z87891 Personal history of nicotine dependence: Secondary | ICD-10-CM | POA: Insufficient documentation

## 2015-01-17 DIAGNOSIS — Z952 Presence of prosthetic heart valve: Secondary | ICD-10-CM | POA: Diagnosis not present

## 2015-01-17 NOTE — Progress Notes (Signed)
Elaine Le returned to exercise at cardiac rehab today. Jackelyn Poling has a fine micropapular rash to both her lower legs. Dr Roxy Manns discontinued her warfarin. Patient exercised without difficulty. Will continue to monitor the patient throughout  the program.

## 2015-01-17 NOTE — Progress Notes (Signed)
Reviewed home exercise guidelines with patient including endpoints, temperature precautions, target heart rate and rate of perceived exertion. Pt plans to walk as her mode of home exercise. Pt voices understanding of instructions given. Olinty M Richards, MS, ACSM CCEP  

## 2015-01-18 ENCOUNTER — Other Ambulatory Visit (HOSPITAL_COMMUNITY): Payer: Medicare Other

## 2015-01-19 ENCOUNTER — Telehealth: Payer: Self-pay | Admitting: Cardiology

## 2015-01-19 ENCOUNTER — Encounter (HOSPITAL_COMMUNITY)
Admission: RE | Admit: 2015-01-19 | Discharge: 2015-01-19 | Disposition: A | Discharge status: Home / Self Care | Payer: Medicare Other | Source: Ambulatory Visit | Attending: Cardiology | Admitting: Cardiology

## 2015-01-19 DIAGNOSIS — Z48812 Encounter for surgical aftercare following surgery on the circulatory system: Secondary | ICD-10-CM | POA: Diagnosis not present

## 2015-01-19 NOTE — Telephone Encounter (Signed)
Pt having some ankle edema, weight up 1.3kg. Verdis Frederickson called from Cardiac Rehab to inform.  We reviewed chart, noted pt was seen by Dr. Roxy Manns on 9.19, instructed on lasix dose reduction to 20mg  daily for 1 week w/ aim to discontinue med if no symptoms. Patient reports she stopped lasix completely following this visit. i advised to resume at 20mg  dose for 1 week, and if symptoms unimproved over weekend to call - may need to readjust dose or go back up to previous dosing. Also instructed to continue oral potassium. Will route to Dr.Hochrein for additional advice.

## 2015-01-19 NOTE — Progress Notes (Signed)
[name]'s weight is up 0.6 kg from Wednesday.  Jackelyn Poling said she stopped taking her lasix on Wednesday per Dr Roxy Manns. Upon assessment lung fields with expiratory wheeze left posterior base otherwise lungs are clear. The patient reports that her abdomen feels full. No peripheral edema noted.  Dr Hochrein's office called and notified, spoke with Charlotte Sanes RN. Ovid Curd gave the patient instructions regarding her Lasix. Will fax exercise flow sheets to Dr. Rosezella [place] office for review.

## 2015-01-19 NOTE — Telephone Encounter (Signed)
Agree 

## 2015-01-22 ENCOUNTER — Encounter (HOSPITAL_COMMUNITY)
Admission: RE | Admit: 2015-01-22 | Discharge: 2015-01-22 | Disposition: A | Discharge status: Home / Self Care | Payer: Medicare Other | Source: Ambulatory Visit | Attending: Cardiology | Admitting: Cardiology

## 2015-01-22 DIAGNOSIS — Z48812 Encounter for surgical aftercare following surgery on the circulatory system: Secondary | ICD-10-CM | POA: Diagnosis not present

## 2015-01-24 ENCOUNTER — Encounter (HOSPITAL_COMMUNITY)
Admission: RE | Admit: 2015-01-24 | Discharge: 2015-01-24 | Disposition: A | Discharge status: Home / Self Care | Payer: Medicare Other | Source: Ambulatory Visit | Attending: Cardiology | Admitting: Cardiology

## 2015-01-24 ENCOUNTER — Telehealth: Payer: Self-pay | Admitting: Cardiology

## 2015-01-24 DIAGNOSIS — Z48812 Encounter for surgical aftercare following surgery on the circulatory system: Secondary | ICD-10-CM | POA: Diagnosis not present

## 2015-01-24 DIAGNOSIS — R0602 Shortness of breath: Secondary | ICD-10-CM

## 2015-01-24 NOTE — Telephone Encounter (Signed)
Elaine Le from Cardiac Rehab called.  Patient is concerned that she is gaining weight  Weight change from 53.1 kg to 54.8 kg  Was put on Lasix 20 mg and K+ 10 on 9/12

## 2015-01-24 NOTE — Progress Notes (Signed)
Elaine Le's weight is 54.8kg today. Oxygen saturation 97% on room air lung fields clear upon ascultation, No peripheral edema noted. Jackelyn Poling says she has been taking 20 mg of furosemide once a day and 10 meq of kdur daily. Elaine Le denies shortness of breath but is concerned that she has been gaining weight since her diuretic has been decreased. Will notify Dr Hochrein's office about the patients concerns. No complaints voiced today during exercise at cardiac rehab.

## 2015-01-24 NOTE — Telephone Encounter (Signed)
Verdis Frederickson is calling to speak with Nya about this pt  Thanks

## 2015-01-25 NOTE — Telephone Encounter (Signed)
Please call the patient back to day and see if the weight has still gone up.  If so give an extra 20 mg of Lasix today.

## 2015-01-25 NOTE — Telephone Encounter (Signed)
Spoke with pt she took her weight this morning and it was 116 lbs, she took the last of her 20 mg lasix this morning, she want to know if she can do lab work to see if she have any fluid build-up, she's not having any SOB, but stated that the nurse at Cardiac rehab heard some rattles in her chest.

## 2015-01-25 NOTE — Telephone Encounter (Signed)
Check a BNP.

## 2015-01-26 ENCOUNTER — Encounter (HOSPITAL_COMMUNITY)
Admission: RE | Admit: 2015-01-26 | Discharge: 2015-01-26 | Disposition: A | Discharge status: Home / Self Care | Payer: Medicare Other | Source: Ambulatory Visit | Attending: Cardiology | Admitting: Cardiology

## 2015-01-26 ENCOUNTER — Telehealth: Payer: Self-pay | Admitting: Cardiology

## 2015-01-26 DIAGNOSIS — Z48812 Encounter for surgical aftercare following surgery on the circulatory system: Secondary | ICD-10-CM | POA: Diagnosis not present

## 2015-01-26 NOTE — Telephone Encounter (Signed)
Spoke with Elaine Le, she is going to get BNP drawn as soon as she is finish with Cardiac rehab today

## 2015-01-26 NOTE — Telephone Encounter (Signed)
Spoke with Elaine Le at Cardiac rehab letting her know pt is going to get a BNP done today

## 2015-01-26 NOTE — Progress Notes (Signed)
Patient's weight is 54.3kg today. Patient is still concerned about her weight. Oxygen saturation 99% on room air. [name] reported that she did not take her lasix or potassium. Dr Hochrein's office called and notified. Spoke with [place]. Jackelyn Poling  Will go to Dr [employer] office for lab work. No complaints during exercise today.

## 2015-01-26 NOTE — Telephone Encounter (Signed)
BNP was order and message left for pt to get blood work done.

## 2015-01-27 LAB — BRAIN NATRIURETIC PEPTIDE: Brain Natriuretic Peptide: 49.8 pg/mL (ref 0.0–100.0)

## 2015-01-29 ENCOUNTER — Ambulatory Visit: Payer: Medicare Other | Admitting: Pharmacist Clinician (PhC)/ Clinical Pharmacy Specialist

## 2015-01-29 ENCOUNTER — Encounter (HOSPITAL_COMMUNITY)
Admission: RE | Admit: 2015-01-29 | Discharge: 2015-01-29 | Disposition: A | Discharge status: Home / Self Care | Payer: Medicare Other | Source: Ambulatory Visit | Attending: Cardiology | Admitting: Cardiology

## 2015-01-29 DIAGNOSIS — Z48812 Encounter for surgical aftercare following surgery on the circulatory system: Secondary | ICD-10-CM | POA: Diagnosis present

## 2015-01-29 DIAGNOSIS — Z952 Presence of prosthetic heart valve: Secondary | ICD-10-CM | POA: Insufficient documentation

## 2015-01-31 ENCOUNTER — Telehealth: Payer: Self-pay | Admitting: Physician Assistant

## 2015-01-31 ENCOUNTER — Encounter (HOSPITAL_COMMUNITY)
Admission: RE | Admit: 2015-01-31 | Discharge: 2015-01-31 | Disposition: A | Discharge status: Home / Self Care | Payer: Medicare Other | Source: Ambulatory Visit | Attending: Cardiology | Admitting: Cardiology

## 2015-01-31 DIAGNOSIS — Z48812 Encounter for surgical aftercare following surgery on the circulatory system: Secondary | ICD-10-CM | POA: Diagnosis not present

## 2015-01-31 NOTE — Progress Notes (Signed)
Ethelda Chick 54 y.o. female Nutrition Note Spoke with pt. Nutrition Plan reviewed with pt. Pt returned her Nutrition Survey incomplete. Pt combative re: "I don't like the way that survey categorizes certain foods." Pt's concerns addressed. Due to pt resistance, will talk with pt another day re: elevated A1c level.   Lab Results  Component Value Date   HGBA1C 6.3* 11/06/2014    Nutrition Diagnosis ? Food-and nutrition-related knowledge deficit related to lack of exposure to information as related to diagnosis of: ? CVD ? Pre-DM ? Not ready for lifestyle change related to denial of need for change as evidenced by reluctance to participate in encounter.  Nutrition RX/ Estimated Daily Nutrition Needs for: wt  maintenance 1500-1700 Kcal, 50-55 gm fat, 9-11 gm sat fat, 1.4-1.7 gm trans-fat, <1500 mg sodium  Nutrition Intervention ? Pt's individual nutrition plan reviewed with pt. ? Pt to attend the Portion Distortion class ? Pt to attend the   ? Nutrition I class                  ? Nutrition II class ? Continue client-centered nutrition education by RD, as part of interdisciplinary care. Goal(s) 1. Pt to describe the potential benefits of adopting Therapeutic Lifestyle Changes Monitor and Evaluate progress toward nutrition goal with team. Derek Mound, M.Ed, RD, LDN, CDE 01/31/2015 12:22 PM

## 2015-01-31 NOTE — Progress Notes (Signed)
Patient's weight is 55.5 kg today. Upon assessment lung fields clear upon ascultation. No peripheral edema noted. Oxygen saturation 98% on room air. Elaine Le is tearful today about her gradual weight gain. Hal Meng PAC called and notified. No new orders received. Hal talked with Elaine Le over the phone. Elaine Le also is concerned about a continuing micropapular rash she has on her lower legs. Dr Nathanial Millman office called and notified. Appointment made for [name] to follow up with Dr Linna Darner tomorrow at 0900.Will fax exercise flow sheets to Dr. Clayborn Heron office for review. Elaine Le continues denies having shortness of breath. Will continue to monitor the patient throughout  the program.

## 2015-01-31 NOTE — Telephone Encounter (Signed)
Patient was previously on 40mg  lasix which taper down to 20mg  lasix, now off lasix for past week. S/p mitral valve prosthesis in July 2016.   Contacted by [facility] cardiac rehab for mild weight gain, however pt does not have SOB, LE edema, orthopnea or PND. Advised to keep leftover lasix at home on an as needed basis. Take lasix PRN only if has increasing LE swelling, increasing SOB or PND symptom.   Hilbert Corrigan PA Pager: [phone]

## 2015-02-01 ENCOUNTER — Ambulatory Visit (INDEPENDENT_AMBULATORY_CARE_PROVIDER_SITE_OTHER): Payer: Medicare Other | Admitting: Internal Medicine

## 2015-02-01 ENCOUNTER — Other Ambulatory Visit (INDEPENDENT_AMBULATORY_CARE_PROVIDER_SITE_OTHER): Payer: Medicare Other

## 2015-02-01 ENCOUNTER — Encounter: Payer: Self-pay | Admitting: Internal Medicine

## 2015-02-01 VITALS — BP 154/94 | HR 97 | Temp 98.5°F | Resp 16 | Wt 119.0 lb

## 2015-02-01 DIAGNOSIS — E039 Hypothyroidism, unspecified: Secondary | ICD-10-CM | POA: Diagnosis not present

## 2015-02-01 DIAGNOSIS — R233 Spontaneous ecchymoses: Secondary | ICD-10-CM

## 2015-02-01 LAB — CBC WITH DIFFERENTIAL/PLATELET
BASOS PCT: 0.8 % (ref 0.0–3.0)
Basophils Absolute: 0.1 10*3/uL (ref 0.0–0.1)
EOS PCT: 1.7 % (ref 0.0–5.0)
Eosinophils Absolute: 0.1 10*3/uL (ref 0.0–0.7)
HEMATOCRIT: 45.3 % (ref 36.0–46.0)
HEMOGLOBIN: 15.1 g/dL — AB (ref 12.0–15.0)
LYMPHS PCT: 25.6 % (ref 12.0–46.0)
Lymphs Abs: 2 10*3/uL (ref 0.7–4.0)
MCHC: 33.4 g/dL (ref 30.0–36.0)
MCV: 86.1 fl (ref 78.0–100.0)
MONOS PCT: 6.9 % (ref 3.0–12.0)
Monocytes Absolute: 0.5 10*3/uL (ref 0.1–1.0)
NEUTROS ABS: 5.1 10*3/uL (ref 1.4–7.7)
Neutrophils Relative %: 65 % (ref 43.0–77.0)
PLATELETS: 292 10*3/uL (ref 150.0–400.0)
RBC: 5.27 Mil/uL — ABNORMAL HIGH (ref 3.87–5.11)
RDW: 15 % (ref 11.5–15.5)
WBC: 7.9 10*3/uL (ref 4.0–10.5)

## 2015-02-01 NOTE — Progress Notes (Signed)
   Subjective:    Patient ID: Elaine Le, female    DOB: 29-Oct-1960, 54 y.o.   MRN: [id number]  HPI   She has had a rash the last 3 weeks.; It began after warfarin was started. Even though warfarin was discontinued and she was placed on aspirin 325 mg ,the rash has persisted. It involves the lower legs and is extending toward the thighs. It is associated with itching intermittently. She also has had some occasional chills.  She had a 15 pound weight gain since 10/26/14. She has increasing fatigue. Hair has been thinning. She has some intolerance to heat. She does snore but there is no diagnosis of sleep apnea.  She walks a mile 4 times a week AND she is in cardiac/pulmonary rehabilitation 3 times a week. She is on a heart healthy diet with no added salt.  Her TSH was 7.56 on 01/10/15. Thyroid was prescribed but not taken as she wants to treat this "naturally".  She denies any bleeding dyscrasias. She denies intake of any NSAIDs. She does remain on the full dose aspirin.    Review of Systems  No associated itchy, watery eyes. Swelling of the lips or tongue or intraoral lesions denied. Shortness of breath, wheezing, or cough absent. No vesicles, pustules or urticaria noted. Diarrhea not present. No dysuria, pyuria or hematuria. Chest pain, palpitations, tachycardia,  paroxysmal nocturnal dyspnea, claudication or edema are absent. Epistaxis, hemoptysis, hematuria, melena, or rectal bleeding denied. No unexplained weight loss, significant dyspepsia,dysphagia, or abdominal pain.  There is no abnormal bruising , bleeding, or difficulty stopping bleeding with injury.     Objective:   Physical Exam  Pertinent or positive findings include: Bilateral ptosis is present. Thyroid is normal to palpation. First heart sound is accentuated slightly. She has diffuse petechial type lesions over the lower extremities. These do not blanch.  General appearance :adequately nourished; in no  distress.  Eyes: No conjunctival inflammation or scleral icterus is present.  Oral exam:  Lips and gums are healthy appearing.There is no oropharyngeal erythema or exudate noted. Dental hygiene is good.  Heart:  Normal rate and regular rhythm. S 2 normal without gallop, murmur, click, rub or other extra sounds    Lungs:Chest clear to auscultation; no wheezes, rhonchi,rales ,or rubs present.No increased work of breathing.   Abdomen: bowel sounds normal, soft and non-tender without masses, organomegaly or hernias noted.  No guarding or rebound.   Vascular : all pulses equal ; no bruits present.  Skin:Warm & dry ; no tenting or jaundice   Lymphatic: No lymphadenopathy is noted about the head, neck, axilla.   Neuro: Strength, tone & DTRs normal.        Assessment & Plan:  #1 hypothyroidism; pathophysiology was explained  #2 petechial rash  Plan: See orders recommendations

## 2015-02-01 NOTE — Patient Instructions (Signed)
  Your next office appointment will be determined based upon review of your pending labs  and  xrays  Those written interpretation of the lab results and instructions will be transmitted to you by mail for your records.  Critical results will be called.   Followup as needed for any active or acute issue. Please report any significant change in your symptoms. 

## 2015-02-01 NOTE — Progress Notes (Signed)
Pre visit review using our clinic review tool, if applicable. No additional management support is needed unless otherwise documented below in the visit note. 

## 2015-02-02 ENCOUNTER — Encounter (HOSPITAL_COMMUNITY)
Admission: RE | Admit: 2015-02-02 | Discharge: 2015-02-02 | Disposition: A | Discharge status: Home / Self Care | Payer: Medicare Other | Source: Ambulatory Visit | Attending: Cardiology | Admitting: Cardiology

## 2015-02-02 DIAGNOSIS — Z48812 Encounter for surgical aftercare following surgery on the circulatory system: Secondary | ICD-10-CM | POA: Diagnosis not present

## 2015-02-05 ENCOUNTER — Encounter (HOSPITAL_COMMUNITY)
Admission: RE | Admit: 2015-02-05 | Discharge: 2015-02-05 | Disposition: A | Discharge status: Home / Self Care | Payer: Medicare Other | Source: Ambulatory Visit | Attending: Cardiology | Admitting: Cardiology

## 2015-02-05 DIAGNOSIS — Z48812 Encounter for surgical aftercare following surgery on the circulatory system: Secondary | ICD-10-CM | POA: Diagnosis not present

## 2015-02-06 ENCOUNTER — Encounter: Payer: Self-pay | Admitting: Cardiology

## 2015-02-07 ENCOUNTER — Encounter (HOSPITAL_COMMUNITY): Payer: Medicare Other

## 2015-02-09 ENCOUNTER — Encounter (HOSPITAL_COMMUNITY)
Admission: RE | Admit: 2015-02-09 | Discharge: 2015-02-09 | Disposition: A | Discharge status: Home / Self Care | Payer: Medicare Other | Source: Ambulatory Visit | Attending: Cardiology | Admitting: Cardiology

## 2015-02-09 DIAGNOSIS — Z48812 Encounter for surgical aftercare following surgery on the circulatory system: Secondary | ICD-10-CM | POA: Diagnosis not present

## 2015-02-12 ENCOUNTER — Encounter (HOSPITAL_COMMUNITY)
Admission: RE | Admit: 2015-02-12 | Discharge: 2015-02-12 | Disposition: A | Discharge status: Home / Self Care | Payer: Medicare Other | Source: Ambulatory Visit | Attending: Cardiology | Admitting: Cardiology

## 2015-02-12 DIAGNOSIS — Z48812 Encounter for surgical aftercare following surgery on the circulatory system: Secondary | ICD-10-CM | POA: Diagnosis not present

## 2015-02-12 NOTE — Progress Notes (Signed)
Patient is concerned that she continues to slowly gain weight. [name]'s weight today was 56.0kg. Oxygen saturation 98% on room air. Lung fields clear upon ascultation. No peripheral edema noted.  [name] denies shortness of breath. [name] started taking thyroid medication about two weeks ago. [name] stopped taking lasix and potassium at the end of September per Dr Roxy Manns. Appointment made for patient to follow up with Mauricio Po NP at Dr Nathanial Millman office on Wednesday at 1:30pm. Will fax exercise flow sheets to Dr. Nathanial Millman office for review. Will also notify Dr Percival Spanish.

## 2015-02-13 ENCOUNTER — Telehealth (HOSPITAL_COMMUNITY): Payer: Self-pay | Admitting: *Deleted

## 2015-02-13 NOTE — Telephone Encounter (Signed)
-----   Message from Minus Breeding, MD sent at 02/13/2015 12:32 PM EDT ----- Regarding: RE: weight gain If she is saturating well, has no crackles then now I wouldn't restart.   Thanks. Elaine Le ----- Message -----    From: Magda Kiel, RN    Sent: 02/13/2015  10:13 AM      To: Minus Breeding, MD, Vennie Homans Subject: weight gain                                    Good morning Dr Percival Spanish,  I want to bring it to your attention that Elaine Le has gained a total of 2.9 kg since beginning cardiac rehab 12/25/2014. Dr Roxy Manns told Elaine Le on 01/15/1915 to decrease and discontinue her furosemide. Elaine Le has gained a little weight every day and is very concerned about this. Elaine Le's oxygen saturations have been in the upper 90's, her lungs have been clear upon ascultation recently. No peripheral edema is present. I called Elaine Le's primary care physician. Elaine Le has an appointment tomorrow to discuss this. Does Elaine Le need to restart her diuretic?     Thanks for your input!  Elaine Le

## 2015-02-14 ENCOUNTER — Encounter: Payer: Self-pay | Admitting: Family

## 2015-02-14 ENCOUNTER — Encounter (HOSPITAL_COMMUNITY)
Admission: RE | Admit: 2015-02-14 | Discharge: 2015-02-14 | Disposition: A | Discharge status: Home / Self Care | Payer: Medicare Other | Source: Ambulatory Visit | Attending: Cardiology | Admitting: Cardiology

## 2015-02-14 ENCOUNTER — Ambulatory Visit (INDEPENDENT_AMBULATORY_CARE_PROVIDER_SITE_OTHER): Payer: Medicare Other | Admitting: Family

## 2015-02-14 ENCOUNTER — Other Ambulatory Visit (INDEPENDENT_AMBULATORY_CARE_PROVIDER_SITE_OTHER): Payer: Medicare Other

## 2015-02-14 ENCOUNTER — Telehealth: Payer: Self-pay | Admitting: Family

## 2015-02-14 VITALS — BP 102/72 | HR 100 | Temp 98.3°F | Resp 18 | Ht 60.0 in | Wt 121.8 lb

## 2015-02-14 DIAGNOSIS — Z48812 Encounter for surgical aftercare following surgery on the circulatory system: Secondary | ICD-10-CM | POA: Diagnosis not present

## 2015-02-14 DIAGNOSIS — E039 Hypothyroidism, unspecified: Secondary | ICD-10-CM | POA: Diagnosis not present

## 2015-02-14 LAB — CORTISOL: CORTISOL PLASMA: 4.9 ug/dL

## 2015-02-14 LAB — TSH: TSH: 4.91 u[IU]/mL — AB (ref 0.35–4.50)

## 2015-02-14 MED ORDER — SYNTHROID 50 MCG PO TABS: 50.0000 ug | ORAL_TABLET | Freq: Every day | ORAL | Status: DC

## 2015-02-14 NOTE — Telephone Encounter (Signed)
Spoke with patient regarding results. Increase Synthroid to 50 mcg. Recheck in 6 weeks.

## 2015-02-14 NOTE — Progress Notes (Signed)
Pre visit review using our clinic review tool, if applicable. No additional management support is needed unless otherwise documented below in the visit note. 

## 2015-02-14 NOTE — Progress Notes (Signed)
Subjective:    Patient ID: Elaine Le, female    DOB: 1961-01-13, 54 y.o.   MRN: [id number]  Chief Complaint  Patient presents with  . Follow-up    states that she is steadily gaining weight, feels like she is going through menopause again    HPI:  Elaine Le is a 54 y.o. female who  has a past medical history of Chronic sinusitis; Arthritis; Fibromyalgia; GERD (gastroesophageal reflux disease); Irritable bowel syndrome (IBS); Headaches, cluster; Back pain; Fatigue; Muscle pain; Night sweats; Diverticulosis; Adenomatous colon polyp; Internal hemorrhoids; Hiatal hernia; Pneumonia; Mitral valve regurgitation; COPD (chronic obstructive pulmonary disease)-PFT pending  (07/17/2014); Community acquired pneumonia; CN (constipation) (05/25/2014); Acute respiratory failure with hypercapnia (HCC) (06/21/2014); adenomatous colonic polyps (03/10/2011); IBS (irritable bowel syndrome) (01/21/2011); Influenza A (06/29/2014); Muscular deconditioning (07/05/2014); Pleural effusion; Protein-calorie malnutrition, severe ([place]) (07/06/2014); Chronic pain syndrome; Tobacco abuse; Chronic diastolic heart failure ([place]); 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 and presents today for a follow up office visit.  1.) Weight gain - Associated symptoms of weight gain and temperature intolerance has been going on for a couple of months. She has also noted changes to her skin, hair and nails. She was recently diagnosed with hypothyroidism.   Lab Results  Component Value Date   TSH 4.91* 02/14/2015    Wt Readings from Last 3 Encounters:  02/14/15 121 lb 12.8 oz (55.248 kg)  02/01/15 119 lb (53.978 kg)  01/15/15 114 lb (51.71 kg)    Allergies  Allergen Reactions    . Morphine And Related Other (See Comments)    Altered mental state  . Other Other (See Comments)    Any antidepressants per pt- causes altered mental state, anger     Current Outpatient Prescriptions on File Prior to Visit  Medication Sig Dispense Refill  . acetaminophen (TYLENOL) 325 MG tablet Take 2 tablets (650 mg total) by mouth every 4 (four) hours as needed for mild pain (temp > 101.5).    Marland Kitchen aspirin 81 MG EC tablet Take 4 tablets (325 mg total) by mouth daily. 30 tablet   . clotrimazole (GYNE-LOTRIMIN) 1 % vaginal cream Place 1 Applicatorful vaginally at bedtime. 45 g 0  . polyethylene glycol powder (GLYCOLAX) powder Take 255 g by mouth daily as needed. 255 g 12  . ranitidine (ZANTAC) 150 MG tablet TAKE 1 TABLET BY MOUTH EVERY DAY (Patient taking differently: Take 150 mg by mouth at bedtime. ) 60 tablet 10  . sodium chloride (OCEAN) 0.65 % nasal spray Place 2 sprays into both nostrils every 4 (four) hours as needed for congestion.     . [DISCONTINUED] pantoprazole (PROTONIX) 40 MG tablet Take 1 tablet (40 mg total) by mouth daily. 30 tablet 11   No current facility-administered medications on file prior to visit.     Review of Systems  Constitutional: Negative for fever and chills.  Respiratory: Negative for chest tightness and shortness of breath.   Cardiovascular: Negative for chest pain, palpitations and leg swelling.  Endocrine: Positive for cold intolerance and heat intolerance.      Objective:    BP 102/72 mmHg  Pulse 100  Temp(Src) 98.3 F (36.8 C) (Oral)  Resp 18  Ht 5' (1.524 m)  Wt 121 lb 12.8 oz (55.248 kg)  BMI 23.79 kg/m2  SpO2 97% Nursing note and  vital signs reviewed.  Physical Exam  Constitutional: She is oriented to person, place, and time. She appears well-developed and well-nourished. No distress.  Eyes: Conjunctivae are normal. Pupils are equal, round, and reactive to light.  Neck: Neck supple. No thyromegaly present.  Cardiovascular: Normal  rate, regular rhythm, normal heart sounds and intact distal pulses.   Pulmonary/Chest: Effort normal and breath sounds normal.  Neurological: She is alert and oriented to person, place, and time.  Skin: Skin is warm and dry.  Psychiatric: She has a normal mood and affect. Her behavior is normal. Judgment and thought content normal.       Assessment & Plan:   Problem List Items Addressed This Visit      Endocrine   Hypothyroidism - Primary    Symptoms and exam consistent with hypothyroidism. Obtain TSH, T4, and cortisol. Continue current dosage of levothyroxine pending TSH results.      Relevant Orders   TSH (Completed)   Cortisol (Completed)   T4

## 2015-02-14 NOTE — Patient Instructions (Signed)
Thank you for choosing [facility].  Summary/Instructions:   Please stop by the lab on the basement level of the building for your blood work. Your results will be released to [place] (or called to you) after review, usually within 72 hours after test completion. If any changes need to be made, you will be notified at that same time.  If your symptoms worsen or fail to improve, please contact our office for further instruction, or in case of emergency go directly to the emergency room at the closest medical facility.    Hypothyroidism Hypothyroidism is a disorder of the thyroid. The thyroid is a large gland that is located in the lower front of the neck. The thyroid releases hormones that control how the body works. With hypothyroidism, the thyroid does not make enough of these hormones. CAUSES Causes of hypothyroidism may include:  Viral infections.  Pregnancy.  Your own defense system (immune system) attacking your thyroid.  Certain medicines.  Birth defects.  Past radiation treatments to your head or neck.  Past treatment with radioactive iodine.  Past surgical removal of part or all of your thyroid.  Problems with the gland that is located in the center of your brain (pituitary). SIGNS AND SYMPTOMS Signs and symptoms of hypothyroidism may include:  Feeling as though you have no energy (lethargy).  Inability to tolerate cold.  Weight gain that is not explained by a change in diet or exercise habits.  Dry skin.  Coarse hair.  Menstrual irregularity.  Slowing of thought processes.  Constipation.  Sadness or depression. DIAGNOSIS  Your health care provider may diagnose hypothyroidism with blood tests and ultrasound tests. TREATMENT Hypothyroidism is treated with medicine that replaces the hormones that your body does not make. After you begin treatment, it may take several weeks for symptoms to go away. HOME CARE INSTRUCTIONS   Take medicines only as  directed by your health care provider.  If you start taking any new medicines, tell your health care provider.  Keep all follow-up visits as directed by your health care provider. This is important. As your condition improves, your dosage needs may change. You will need to have blood tests regularly so that your health care provider can watch your condition. SEEK MEDICAL CARE IF:  Your symptoms do not get better with treatment.  You are taking thyroid replacement medicine and:  You sweat excessively.  You have tremors.  You feel anxious.  You lose weight rapidly.  You cannot tolerate heat.  You have emotional swings.  You have diarrhea.  You feel weak. SEEK IMMEDIATE MEDICAL CARE IF:   You develop chest pain.  You develop an irregular heartbeat.  You develop a rapid heartbeat.   This information is not intended to replace advice given to you by your health care provider. Make sure you discuss any questions you have with your health care provider.   Document Released: 04/14/2005 Document Revised: 05/05/2014 Document Reviewed: 08/30/2013 Elsevier Interactive Patient Education [employer].

## 2015-02-14 NOTE — Assessment & Plan Note (Signed)
Symptoms and exam consistent with hypothyroidism. Obtain TSH, T4, and cortisol. Continue current dosage of levothyroxine pending TSH results.

## 2015-02-16 ENCOUNTER — Encounter (HOSPITAL_COMMUNITY)
Admission: RE | Admit: 2015-02-16 | Discharge: 2015-02-16 | Disposition: A | Discharge status: Home / Self Care | Payer: Medicare Other | Source: Ambulatory Visit | Attending: Cardiology | Admitting: Cardiology

## 2015-02-16 ENCOUNTER — Other Ambulatory Visit: Payer: Medicare Other

## 2015-02-16 DIAGNOSIS — Z48812 Encounter for surgical aftercare following surgery on the circulatory system: Secondary | ICD-10-CM | POA: Diagnosis not present

## 2015-02-16 LAB — T4

## 2015-02-19 ENCOUNTER — Encounter (HOSPITAL_COMMUNITY): Payer: Medicare Other

## 2015-02-21 ENCOUNTER — Encounter (HOSPITAL_COMMUNITY)
Admission: RE | Admit: 2015-02-21 | Discharge: 2015-02-21 | Disposition: A | Discharge status: Home / Self Care | Payer: Medicare Other | Source: Ambulatory Visit | Attending: Cardiology | Admitting: Cardiology

## 2015-02-21 DIAGNOSIS — Z48812 Encounter for surgical aftercare following surgery on the circulatory system: Secondary | ICD-10-CM | POA: Diagnosis not present

## 2015-02-23 ENCOUNTER — Encounter (HOSPITAL_COMMUNITY)
Admission: RE | Admit: 2015-02-23 | Discharge: 2015-02-23 | Disposition: A | Discharge status: Home / Self Care | Payer: Medicare Other | Source: Ambulatory Visit | Attending: Cardiology | Admitting: Cardiology

## 2015-02-23 DIAGNOSIS — Z48812 Encounter for surgical aftercare following surgery on the circulatory system: Secondary | ICD-10-CM | POA: Diagnosis not present

## 2015-02-26 ENCOUNTER — Encounter: Payer: Self-pay | Admitting: Cardiology

## 2015-02-26 ENCOUNTER — Encounter (HOSPITAL_COMMUNITY)
Admission: RE | Admit: 2015-02-26 | Discharge: 2015-02-26 | Disposition: A | Discharge status: Home / Self Care | Payer: Medicare Other | Source: Ambulatory Visit | Attending: Cardiology | Admitting: Cardiology

## 2015-02-26 DIAGNOSIS — Z48812 Encounter for surgical aftercare following surgery on the circulatory system: Secondary | ICD-10-CM | POA: Diagnosis not present

## 2015-02-28 ENCOUNTER — Encounter (HOSPITAL_COMMUNITY)
Admission: RE | Admit: 2015-02-28 | Discharge: 2015-02-28 | Disposition: A | Discharge status: Home / Self Care | Payer: Medicare Other | Source: Ambulatory Visit | Attending: Cardiology | Admitting: Cardiology

## 2015-02-28 DIAGNOSIS — Z48812 Encounter for surgical aftercare following surgery on the circulatory system: Secondary | ICD-10-CM | POA: Diagnosis not present

## 2015-02-28 DIAGNOSIS — Z952 Presence of prosthetic heart valve: Secondary | ICD-10-CM | POA: Diagnosis not present

## 2015-02-28 NOTE — Progress Notes (Signed)
When reviewing goals, Elaine Le mentioned that she would really like to lose her belly and to tighten up muscles there as well.  We discussed the importance of core training to target this area.  She has been practicing the brace position with success.  We went over the several more moves to improve her core strength.  She plans to try them at home some in order to make sure she feels comfortable with them before graduating in two weeks. Alberteen Sam, MA, ACSM RCEP

## 2015-03-02 ENCOUNTER — Encounter (HOSPITAL_COMMUNITY)
Admission: RE | Admit: 2015-03-02 | Discharge: 2015-03-02 | Disposition: A | Discharge status: Home / Self Care | Payer: Medicare Other | Source: Ambulatory Visit | Attending: Cardiology | Admitting: Cardiology

## 2015-03-02 ENCOUNTER — Telehealth: Payer: Self-pay

## 2015-03-02 DIAGNOSIS — Z48812 Encounter for surgical aftercare following surgery on the circulatory system: Secondary | ICD-10-CM | POA: Diagnosis not present

## 2015-03-02 NOTE — Telephone Encounter (Signed)
PA initiated via covermymeds. [id number]

## 2015-03-05 ENCOUNTER — Telehealth: Payer: Self-pay

## 2015-03-05 ENCOUNTER — Encounter (HOSPITAL_COMMUNITY)
Admission: RE | Admit: 2015-03-05 | Discharge: 2015-03-05 | Disposition: A | Discharge status: Home / Self Care | Payer: Medicare Other | Source: Ambulatory Visit | Attending: Cardiology | Admitting: Cardiology

## 2015-03-05 DIAGNOSIS — Z48812 Encounter for surgical aftercare following surgery on the circulatory system: Secondary | ICD-10-CM | POA: Diagnosis not present

## 2015-03-05 NOTE — Telephone Encounter (Signed)
PA was done for levothyroxine sodium and was approved.

## 2015-03-07 ENCOUNTER — Encounter (HOSPITAL_COMMUNITY)
Admission: RE | Admit: 2015-03-07 | Discharge: 2015-03-07 | Disposition: A | Discharge status: Home / Self Care | Payer: Medicare Other | Source: Ambulatory Visit | Attending: Cardiology | Admitting: Cardiology

## 2015-03-07 DIAGNOSIS — Z48812 Encounter for surgical aftercare following surgery on the circulatory system: Secondary | ICD-10-CM | POA: Diagnosis not present

## 2015-03-08 ENCOUNTER — Encounter: Payer: Self-pay | Admitting: Pulmonary Disease

## 2015-03-08 ENCOUNTER — Ambulatory Visit (INDEPENDENT_AMBULATORY_CARE_PROVIDER_SITE_OTHER): Payer: Medicare Other | Admitting: Pulmonary Disease

## 2015-03-08 ENCOUNTER — Ambulatory Visit (INDEPENDENT_AMBULATORY_CARE_PROVIDER_SITE_OTHER)
Admission: RE | Admit: 2015-03-08 | Discharge: 2015-03-08 | Disposition: A | Discharge status: Home / Self Care | Payer: Medicare Other | Source: Ambulatory Visit | Attending: Pulmonary Disease | Admitting: Pulmonary Disease

## 2015-03-08 VITALS — BP 130/74 | HR 97 | Ht 60.5 in | Wt 123.0 lb

## 2015-03-08 DIAGNOSIS — R5382 Chronic fatigue, unspecified: Secondary | ICD-10-CM

## 2015-03-08 DIAGNOSIS — J432 Centrilobular emphysema: Secondary | ICD-10-CM | POA: Diagnosis not present

## 2015-03-08 DIAGNOSIS — R0600 Dyspnea, unspecified: Secondary | ICD-10-CM

## 2015-03-08 DIAGNOSIS — R5383 Other fatigue: Secondary | ICD-10-CM | POA: Insufficient documentation

## 2015-03-08 DIAGNOSIS — R0602 Shortness of breath: Secondary | ICD-10-CM | POA: Insufficient documentation

## 2015-03-08 NOTE — Progress Notes (Signed)
Subjective:    Patient ID: Elaine Le, female    DOB: April 25, 1961, 54 y.o.   MRN: [id number]  Synopsis: Elaine Le was hospitalized for a severe exacerbation of COPD with pneumonia in early 2016. She established care with the [facility] afterwards. 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  HPI Chief Complaint  Patient presents with  . Follow-up    pt c/o "not being able to breathe as she should be", doesn't know if it's lungs or sinuses.  denies sob, cough, mucus production.     Elaine Le has been struggling with her weight since the surgery. She says that she has gained about twenty pounds since then. She thinks that her thyroid is causing the weight gain. She has been exercising a lot lately.   She feels really sleepy and falls a sleep easily. She sleeps OK at night, but notes some insomnia.  Does not wake up short of breath, not gasping for air. She has had a dry throat a few times. She knows that she snores because she woke up snoring. The breathing has been not as good since the heart surgery.  Albuterol doesn't help with her breathing. She does not cough up mucus or wheeze.  Past Medical History  Diagnosis Date  . Chronic sinusitis   . Arthritis   . Fibromyalgia   . GERD (gastroesophageal reflux disease)   . Irritable bowel syndrome (IBS)   . Headaches, cluster   . Back pain   . Fatigue   . Muscle pain   . Night sweats   . Diverticulosis   . Adenomatous colon polyp   . Internal hemorrhoids   . Hiatal hernia   . Pneumonia   . Mitral valve regurgitation     severe  . COPD (chronic obstructive pulmonary disease)-PFT pending  07/17/2014  . Community acquired pneumonia   . CN (constipation) 05/25/2014  . Acute respiratory  failure with hypercapnia ([place]) 06/21/2014  . Hx of adenomatous colonic polyps 03/10/2011    Oct 2012, repeat colon Oct 2017   . IBS (irritable bowel syndrome) 01/21/2011  . Influenza A 06/29/2014  . Muscular deconditioning 07/05/2014  . Pleural effusion   . Protein-calorie malnutrition, severe ([place]) 07/06/2014  . Chronic pain syndrome   . Tobacco abuse   . Chronic diastolic heart failure (HCC)     related to MR/MS  . 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/10/2014    Severe mitral regurgitation with moderate mitral stenosis  . Severe mitral regurgitation by prior echocardiogram 07/11/2014  . Mitral valve stenosis, moderate 07/11/2014  . Acute systolic heart failure ([place]) 09/2014  . 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 Negative for fever and chills.  HENT: Negative for postnasal drip, rhinorrhea and sinus pressure.   Respiratory: Positive for shortness of breath. Negative for cough and wheezing.   Cardiovascular: Negative for chest pain and leg swelling.       Objective:   Physical Exam Filed Vitals:   03/08/15 1144  BP: 130/74  Pulse: 97  Height: 5' 0.5" (1.537 m)  Weight: 123 lb (55.792 kg)  SpO2: 98%  RA  Gen: well appearing HENT: OP clear, TM's clear, neck supple PULM: CTA B, normal percussion CV: RRR, no mgr, trace edema GI: BS+, soft, nontender Derm: no cyanosis or rash Psyche: normal mood and affect   Office notes from Dr. Zenia Resides reviewed where she was followed up after her mitral valve replacement Chest x-ray images from August 2016 reviewed showing mild atelectasis in the right lung, emphysema      Assessment & Plan:  COPD (chronic obstructive pulmonary disease)-mild This has been a stable for her with her mild COPD. She has not had an exacerbation. Her  pneumonia vaccines are up-to-date. Amazingly, she refuses to have the flu shot despite the fact that she was critically ill from influenza a year ago. I spent a significant amount of time today in clinic try to convince her to take a flu shot but she is afraid that it's going to lead to worsening symptoms (she had the shot last year before she develops severe flu).  Plan: We'll refer to pulmonary rehabilitation because of ongoing deconditioning in the setting of her COPD with dyspnea Get a flu shot Follow-up one year  Dyspnea She continues to have mild dyspnea associated with fatigue. Today on exam her lungs are clear and her oxygenation is normal. She only has mild COPD so it's hard to say that the dyspnea is related to her COPD. Of note, her most recent chest x-ray showed some persistent atelectasis after surgery and radiology recommended a repeat chest x-ray.  Plan: Continue to treat hypothyroidism Check a chest x-ray today considering the abnormal findings from the last study  Fatigue She continues to have fatigue after her surgery. This may be related to the hypothyroidism. However, she notes significant daytime somnolence. Considering her known COPD, the fact that she knows that she snores, and the fact that she had oxygen desaturation in the middle the night after surgery makes me wonder whether or not she has obstructive sleep apnea.  Plan: Continue treatment for hyperthyroidism, if no improvement however after several weeks then she is to call our office and we can arrange a sleep study.     Current outpatient prescriptions:  .  acetaminophen (TYLENOL) 325 MG tablet, Take 2 tablets (650 mg total) by mouth every 4 (four) hours as needed for mild pain (temp > 101.5)., Disp: , Rfl:  .  aspirin 81 MG EC tablet, Take 4 tablets (325 mg total) by mouth daily., Disp: 30 tablet, Rfl:  .  clotrimazole (GYNE-LOTRIMIN) 1 % vaginal cream, Place 1 Applicatorful vaginally at bedtime., Disp: 45  g, Rfl: 0 .  Coenzyme Q10 (CO Q 10) 10 MG CAPS, Take by mouth., Disp: , Rfl:  .  loratadine (CLARITIN) 5 MG chewable tablet, Chew 5 mg by mouth daily., Disp: , Rfl:  .  mometasone (NASONEX) 50 MCG/ACT nasal spray, Place 2 sprays into the nose daily. At bedtime, Disp: , Rfl:  .  polyethylene glycol powder (GLYCOLAX) powder, Take 255 g by mouth daily as needed., Disp: 255 g, Rfl: 12 .  ranitidine (ZANTAC) 150 MG tablet, TAKE 1 TABLET BY MOUTH EVERY DAY (Patient taking differently: Take 150 mg by mouth at bedtime. ), Disp: 60 tablet, Rfl: 10 .  sodium chloride (OCEAN) 0.65 % nasal spray, Place 2 sprays into both nostrils every 4 (four) hours as needed for congestion. , Disp: , Rfl:  .  SYNTHROID 50 MCG tablet, Take 1 tablet (50 mcg total) by mouth daily before breakfast., Disp: 30 tablet, Rfl: 1 .  [  DISCONTINUED] pantoprazole (PROTONIX) 40 MG tablet, Take 1 tablet (40 mg total) by mouth daily., Disp: 30 tablet, Rfl: 11

## 2015-03-08 NOTE — Assessment & Plan Note (Signed)
This has been a stable for her with her mild COPD. She has not had an exacerbation. Her pneumonia vaccines are up-to-date. Amazingly, she refuses to have the flu shot despite the fact that she was critically ill from influenza a year ago. I spent a significant amount of time today in clinic try to convince her to take a flu shot but she is afraid that it's going to lead to worsening symptoms (she had the shot last year before she develops severe flu).  Plan: We'll refer to pulmonary rehabilitation because of ongoing deconditioning in the setting of her COPD with dyspnea Get a flu shot Follow-up one year

## 2015-03-08 NOTE — Assessment & Plan Note (Signed)
She continues to have mild dyspnea associated with fatigue. Today on exam her lungs are clear and her oxygenation is normal. She only has mild COPD so it's hard to say that the dyspnea is related to her COPD. Of note, her most recent chest x-ray showed some persistent atelectasis after surgery and radiology recommended a repeat chest x-ray.  Plan: Continue to treat hypothyroidism Check a chest x-ray today considering the abnormal findings from the last study

## 2015-03-08 NOTE — Assessment & Plan Note (Signed)
She continues to have fatigue after her surgery. This may be related to the hypothyroidism. However, she notes significant daytime somnolence. Considering her known COPD, the fact that she knows that she snores, and the fact that she had oxygen desaturation in the middle the night after surgery makes me wonder whether or not she has obstructive sleep apnea.  Plan: Continue treatment for hyperthyroidism, if no improvement however after several weeks then she is to call our office and we can arrange a sleep study.

## 2015-03-08 NOTE — Patient Instructions (Signed)
We will refer you to pulmonary rehabilitation Get a flu shot Get a flu shot Get a flu shot We will see you back in one year or sooner if needed Let me know if the fatigue does not improve in the next few weeks, I can order a sleep study to evaluate further We will call you with the results of the chest x-ray

## 2015-03-09 ENCOUNTER — Encounter (HOSPITAL_COMMUNITY)
Admission: RE | Admit: 2015-03-09 | Discharge: 2015-03-09 | Disposition: A | Discharge status: Home / Self Care | Payer: Medicare Other | Source: Ambulatory Visit | Attending: Cardiology | Admitting: Cardiology

## 2015-03-09 DIAGNOSIS — Z48812 Encounter for surgical aftercare following surgery on the circulatory system: Secondary | ICD-10-CM | POA: Diagnosis not present

## 2015-03-12 ENCOUNTER — Encounter (HOSPITAL_COMMUNITY)
Admission: RE | Admit: 2015-03-12 | Discharge: 2015-03-12 | Disposition: A | Discharge status: Home / Self Care | Payer: Medicare Other | Source: Ambulatory Visit | Attending: Cardiology | Admitting: Cardiology

## 2015-03-12 ENCOUNTER — Ambulatory Visit: Payer: Self-pay | Admitting: Pharmacist Clinician (PhC)/ Clinical Pharmacy Specialist

## 2015-03-12 DIAGNOSIS — Z7901 Long term (current) use of anticoagulants: Secondary | ICD-10-CM

## 2015-03-12 DIAGNOSIS — Z953 Presence of xenogenic heart valve: Secondary | ICD-10-CM

## 2015-03-12 DIAGNOSIS — Z48812 Encounter for surgical aftercare following surgery on the circulatory system: Secondary | ICD-10-CM | POA: Diagnosis not present

## 2015-03-14 ENCOUNTER — Encounter (HOSPITAL_COMMUNITY)
Admission: RE | Admit: 2015-03-14 | Discharge: 2015-03-14 | Disposition: A | Discharge status: Home / Self Care | Payer: Medicare Other | Source: Ambulatory Visit | Attending: Cardiology | Admitting: Cardiology

## 2015-03-14 DIAGNOSIS — Z48812 Encounter for surgical aftercare following surgery on the circulatory system: Secondary | ICD-10-CM | POA: Diagnosis not present

## 2015-03-15 NOTE — Progress Notes (Signed)
Pt graduates from cardiac rehab program tomorrow with completion of 27 exercise sessions in Phase II. Pt maintained good attendance and progressed nicely during her participation in rehab as evidenced by increased MET level.   Medication list reconciled. Repeat  PHQ score-0  .  Pt has made significant lifestyle changes and should be commended for her success. Pt feels she has achieved her goals during cardiac rehab.   Pt plans to continue exercise doing core strength training and walking. Jackelyn Poling is still concerned about her weight gain. [name] plans to continue her exercise to maintain her weight.

## 2015-03-16 ENCOUNTER — Encounter (HOSPITAL_COMMUNITY)
Admission: RE | Admit: 2015-03-16 | Discharge: 2015-03-16 | Disposition: A | Discharge status: Home / Self Care | Payer: Medicare Other | Source: Ambulatory Visit | Attending: Cardiology | Admitting: Cardiology

## 2015-03-16 DIAGNOSIS — Z48812 Encounter for surgical aftercare following surgery on the circulatory system: Secondary | ICD-10-CM | POA: Diagnosis not present

## 2015-03-19 ENCOUNTER — Encounter (HOSPITAL_COMMUNITY): Payer: Medicare Other

## 2015-03-21 ENCOUNTER — Encounter (HOSPITAL_COMMUNITY): Payer: Medicare Other

## 2015-03-21 NOTE — Telephone Encounter (Signed)
Faxed approval to pharmacy

## 2015-03-26 ENCOUNTER — Encounter (HOSPITAL_COMMUNITY): Payer: Medicare Other

## 2015-03-28 ENCOUNTER — Encounter (HOSPITAL_COMMUNITY): Payer: Medicare Other

## 2015-03-29 ENCOUNTER — Other Ambulatory Visit (INDEPENDENT_AMBULATORY_CARE_PROVIDER_SITE_OTHER): Payer: Medicare Other

## 2015-03-29 ENCOUNTER — Encounter: Payer: Self-pay | Admitting: Internal Medicine

## 2015-03-29 ENCOUNTER — Ambulatory Visit (INDEPENDENT_AMBULATORY_CARE_PROVIDER_SITE_OTHER): Payer: Medicare Other | Admitting: Internal Medicine

## 2015-03-29 VITALS — BP 140/90 | HR 89 | Temp 99.8°F | Resp 12 | Ht 60.0 in | Wt 127.0 lb

## 2015-03-29 DIAGNOSIS — E039 Hypothyroidism, unspecified: Secondary | ICD-10-CM | POA: Diagnosis not present

## 2015-03-29 LAB — TSH: TSH: 4.03 u[IU]/mL (ref 0.35–4.50)

## 2015-03-29 LAB — T4, FREE: FREE T4: 0.75 ng/dL (ref 0.60–1.60)

## 2015-03-29 NOTE — Progress Notes (Signed)
   Subjective:    Patient ID: Elaine Le, female    DOB: [birth year], 54 y.o.   MRN: [id number]  HPI The patient is a 54 YO female coming in for weight gain and fatigue. She is now on thyroid medicine. Not feeling any better. No energy and cannot get anything done. She is not constipated. No tremors or shaking. No fevers or chills. Not eating any different. Not exercising.   Review of Systems  Constitutional: Positive for fatigue. Negative for fever, activity change and appetite change.  HENT: Negative for postnasal drip, rhinorrhea and sinus pressure.   Respiratory: Negative for cough, chest tightness, shortness of breath and wheezing.   Cardiovascular: Negative for chest pain, palpitations and leg swelling.  Gastrointestinal: Negative for nausea, abdominal pain, diarrhea, constipation and abdominal distention.  Musculoskeletal: Negative.   Skin: Negative.   Neurological: Positive for weakness. Negative for dizziness, speech difficulty, light-headedness and headaches.  Psychiatric/Behavioral: Negative.       Objective:   Physical Exam  Constitutional: She is oriented to person, place, and time. She appears well-developed and well-nourished.  HENT:  Head: Normocephalic and atraumatic.  Eyes: EOM are normal.  Neck: Normal range of motion.  Cardiovascular: Normal rate.   Valve click  Pulmonary/Chest: Effort normal and breath sounds normal.  Abdominal: Soft. Bowel sounds are normal. She exhibits no distension. There is no tenderness. There is no rebound.  Musculoskeletal: She exhibits no edema.  Neurological: She is alert and oriented to person, place, and time.  Skin: Skin is warm and dry.   Filed Vitals:   03/29/15 1111  BP: 140/90  Pulse: 89  Temp: 99.8 F (37.7 C)  TempSrc: Oral  Resp: 12  Height: 5' (1.524 m)  Weight: 127 lb (57.607 kg)  SpO2: 99%      Assessment & Plan:

## 2015-03-29 NOTE — Assessment & Plan Note (Signed)
Checking TSH and free T4 and adjust dosing as needed. Clinically she appears to be inadequately replaced and likely will need dosage adjustment.

## 2015-03-29 NOTE — Patient Instructions (Signed)
We are checking the labs today and will call with adjustments to the thyroid medicine.   If we change the dose we need to see you back in 6-8 weeks as it takes that long for your body to get used to the new dose.

## 2015-03-29 NOTE — Progress Notes (Signed)
Pre visit review using our clinic review tool, if applicable. No additional management support is needed unless otherwise documented below in the visit note. 

## 2015-03-30 ENCOUNTER — Other Ambulatory Visit: Payer: Self-pay | Admitting: Internal Medicine

## 2015-03-30 ENCOUNTER — Encounter (HOSPITAL_COMMUNITY): Payer: Medicare Other

## 2015-03-30 ENCOUNTER — Telehealth: Payer: Self-pay | Admitting: *Deleted

## 2015-03-30 MED ORDER — LEVOTHYROXINE SODIUM 75 MCG PO TABS: 75.0000 ug | ORAL_TABLET | Freq: Every day | ORAL | Status: DC

## 2015-03-30 NOTE — Telephone Encounter (Signed)
Pt has been notified of lab results...Elaine Le

## 2015-03-30 NOTE — Telephone Encounter (Signed)
Left msg on triage requesting lab results.../lmb 

## 2015-03-30 NOTE — Telephone Encounter (Signed)
See result note.  

## 2015-03-31 ENCOUNTER — Telehealth: Payer: Self-pay

## 2015-03-31 NOTE — Telephone Encounter (Signed)
PA was initiated by someone (no documentation entered).   PA approval.   Faxed information to pharmacy.

## 2015-04-06 ENCOUNTER — Ambulatory Visit (INDEPENDENT_AMBULATORY_CARE_PROVIDER_SITE_OTHER): Payer: Medicare Other | Admitting: Cardiology

## 2015-04-06 ENCOUNTER — Other Ambulatory Visit: Payer: Self-pay

## 2015-04-06 ENCOUNTER — Encounter: Payer: Self-pay | Admitting: Cardiology

## 2015-04-06 VITALS — BP 112/70 | HR 90 | Ht 60.0 in | Wt 128.2 lb

## 2015-04-06 DIAGNOSIS — Z953 Presence of xenogenic heart valve: Secondary | ICD-10-CM | POA: Diagnosis not present

## 2015-04-06 DIAGNOSIS — J432 Centrilobular emphysema: Secondary | ICD-10-CM

## 2015-04-06 NOTE — Patient Instructions (Signed)
Your physician wants you to follow-up in: 4 Months. You will receive a reminder letter in the mail two months in advance. If you don't receive a letter, please call our office to schedule the follow-up appointment.   Merry Christmas and Happy New Year!!

## 2015-04-06 NOTE — Progress Notes (Signed)
Cardiology Office Note   Date:  04/06/2015   ID:  Elaine Le, DOB 1961-04-08, MRN [id number]  PCP:  Hoyt Koch, MD  Cardiologist:   Minus Breeding, MD   Chief Complaint  Patient presents with  . Coronary Artery Disease      History of Present Illness: Elaine Le is a 54 y.o. female who presents for follow-up after mitral valve replacement. She had probable rheumatic disease with severe regurgitation and moderate stenosis and is now status post minimally invasive valve replacement.  She is finished with cardiac rehab and is not exercising as much as I would like.  She still thinks that she is retaining some fluid.  The patient denies any new shortness of breath, PND or orthopnea. There have been no reported palpitations, presyncope or syncope.  She has had no fevers or chills.    She does have slow weight gain but with only very mild right leg edema off of Lasix.  She was on her feet a lot over Thanksgiving.    Past Medical History  Diagnosis Date  . Chronic sinusitis   . Arthritis   . Fibromyalgia   . GERD (gastroesophageal reflux disease)   . Irritable bowel syndrome (IBS)   . Headaches, cluster   . Back pain   . Fatigue   . Muscle pain   . Night sweats   . Diverticulosis   . Adenomatous colon polyp   . Internal hemorrhoids   . Hiatal hernia   . Pneumonia   . Mitral valve regurgitation     severe  . COPD (chronic obstructive pulmonary disease)-PFT pending  07/17/2014  . Community acquired pneumonia   . CN (constipation) 05/25/2014  . Acute respiratory failure with hypercapnia ([place]) 06/21/2014  . Hx of adenomatous colonic polyps 03/10/2011    Oct 2012, repeat colon Oct 2017   . IBS (irritable bowel syndrome) 01/21/2011  . Influenza A 06/29/2014  . Muscular deconditioning 07/05/2014  . Pleural effusion   . Protein-calorie malnutrition, severe ([place]) 07/06/2014  . Chronic pain syndrome   . Tobacco abuse   . Chronic diastolic heart failure ([facility])       related to MR/MS  . 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/10/2014    Severe mitral regurgitation with moderate mitral stenosis  . Severe mitral regurgitation by prior echocardiogram 07/11/2014  . Mitral valve stenosis, moderate 07/11/2014  . Acute systolic heart failure ([place]) 09/2014  . S/P minimally invasive mitral valve replacement with bioprosthetic valve 11/08/2014    25 mm [facility] Mitral bovine bioprosthetic tissue valve placed via right mini thoracotomy approach    Past Surgical History  Procedure Laterality Date  . Thoracic outlet surgery    . Tubal ligation    . Vulva surgery    . Neuroplasty / transposition median nerve at carpal tunnel bilateral    . Cystostomy w/ bladder dilation      at age 29  . Tee without cardioversion N/A 07/10/2014    Procedure: TRANSESOPHAGEAL ECHOCARDIOGRAM (TEE);  Surgeon: Lelon Perla, MD;  Location: [facility] ENDOSCOPY;  Service: Cardiovascular;  55-60%. No regional wall motion modalities. Moderate mitral stenosis with severe regurgitation and moderate to severely dilated left atrium.  . Left and right heart catheterization with coronary angiogram N/A 07/27/2014    Procedure: LEFT AND RIGHT HEART CATHETERIZATION WITH CORONARY ANGIOGRAM;  Surgeon: Burnell Blanks, MD;  Location: [facility] CATH LAB;  Right left heart catheterization: Mild/minimal coronary artery disease. RV: 50/7/17, PA: 48/29 (  mean 38), PCWP 33 mmHg  . Transthoracic echocardiogram  07/04/2014    Normal LV Size/Fnx - EF 60-65% no RWMA; MV: thickened leaflets with doming, fixed Posterior leaflet, anterior leaflet w/ large/shaggy & mobile density.  c/w Rheumatic Mitral disease - moderate MS & Mod-Severe MR.  . Mitral valve repair Right 11/08/2014    Procedure: MINIMALLY INVASIVE MITRAL VALVE REPLACEMENT(MVR);  Surgeon: Rexene Alberts, MD;  Location: [place];  Service: Open Heart Surgery;  Laterality: Right;  . Tee without cardioversion N/A 11/08/2014    Procedure: TRANSESOPHAGEAL ECHOCARDIOGRAM  (TEE);  Surgeon: Rexene Alberts, MD;  Location: [place];  Service: Open Heart Surgery;  Laterality: N/A;     Current Outpatient Prescriptions  Medication Sig Dispense Refill  . aspirin 81 MG EC tablet Take 4 tablets (325 mg total) by mouth daily. (Patient taking differently: Take 162 mg by mouth daily. Take two 81 mg tablets by mouth daily.) 30 tablet   . clotrimazole (GYNE-LOTRIMIN) 1 % vaginal cream Place 1 Applicatorful vaginally at bedtime. 45 g 0  . Coenzyme Q10 (CO Q 10) 10 MG CAPS Take by mouth.    . levothyroxine (SYNTHROID, LEVOTHROID) 75 MCG tablet Take 1 tablet (75 mcg total) by mouth daily before breakfast. 30 tablet 3  . loratadine (CLARITIN) 5 MG chewable tablet Chew 5 mg by mouth daily.    . mometasone (NASONEX) 50 MCG/ACT nasal spray Place 2 sprays into the nose daily. At bedtime    . polyethylene glycol powder (GLYCOLAX) powder Take 255 g by mouth daily as needed. 255 g 12  . ranitidine (ZANTAC) 150 MG tablet TAKE 1 TABLET BY MOUTH EVERY DAY (Patient taking differently: Take 150 mg by mouth at bedtime. ) 60 tablet 10  . sodium chloride (OCEAN) 0.65 % nasal spray Place 2 sprays into both nostrils every 4 (four) hours as needed for congestion.     . [DISCONTINUED] pantoprazole (PROTONIX) 40 MG tablet Take 1 tablet (40 mg total) by mouth daily. 30 tablet 11   No current facility-administered medications for this visit.    Allergies:   Morphine and related; Other; and Warfarin and related    ROS:  Please see the history of present illness.   Otherwise, review of systems are positive for none.   All other systems are reviewed and negative.    PHYSICAL EXAM: VS:  BP 112/70 mmHg  Pulse 90  Ht 5' (1.524 m)  Wt 128 lb 3.2 oz (58.151 kg)  BMI 25.04 kg/m2 , BMI Body mass index is 25.04 kg/(m^2). GENERAL:  Well appearing NECK:  No jugular venous distention, waveform within normal limits, carotid upstroke brisk and symmetric, no bruits, no thyromegaly LYMPHATICS:  No cervical,  inguinal adenopathy LUNGS:  Clear to auscultation bilaterally BACK:  No CVA tenderness CHEST:  Her left thoracotomy wound is healed. HEART:  PMI not displaced or sustained,S1 and S2 within normal limits, no S3, no S4, no clicks, no rubs, no murmurs ABD:  Flat, positive bowel sounds normal in frequency in pitch, no bruits, no rebound, no guarding, no midline pulsatile mass, no hepatomegaly, no splenomegaly EXT:  2 plus pulses throughout, trace right lower extremity edema, no cyanosis no clubbing   EKG:  EKG is ordered today. The ekg ordered today demonstrates sinus rhythm, rate 90, axis within normal limits, poor anterior R wave progression, first-degree AV block, QTC prolonged, no acute ST-T wave changes.   Recent Labs: 11/09/2014: Magnesium 2.1 01/10/2015: ALT 27; BUN 9; Creatinine, Ser 0.70; Potassium 4.3;  Sodium 139 02/01/2015: Hemoglobin 15.1*; Platelets 292.0 03/29/2015: TSH 4.03    Lipid Panel    Component Value Date/Time   TRIG 115 06/28/2014 1400      Wt Readings from Last 3 Encounters:  04/06/15 128 lb 3.2 oz (58.151 kg)  03/29/15 127 lb (57.607 kg)  03/08/15 123 lb (55.792 kg)      Other studies Reviewed: Additional studies/ records that were reviewed today include: None    ASSESSMENT AND PLAN:  MVR:  She is doing well and further imaging is not indicated.  She has very mild edema and I did discuss with her PRN Lasix.  She otherwise has no symptoms consistent with volume overload.    ATRIAL FIB:  She's had no recurrent arrhythmia. She is off the amiodarone.    PROLONGED QT:  Her QT is normal today.    Current medicines are reviewed at length with the patient today.  The patient does not have concerns regarding medicines.  The following changes have been made:  no change  Labs/ tests ordered today include: None     Disposition:   FU with me in 4 months.    Signed, Minus Breeding, MD  04/06/2015 10:05 AM    [place]

## 2015-04-12 ENCOUNTER — Telehealth (HOSPITAL_COMMUNITY): Payer: Self-pay

## 2015-04-12 NOTE — Telephone Encounter (Signed)
Called patient to discuss Pulmonary Rehab per Dr. Anastasia Pall referral.  Patient is interested but can not afford the financial responsibility. Patient may consider the cardiac maintenance program which is $68 a month since the patient just graduated from the [facility]. Patient states she will follow up with [place].

## 2015-05-01 ENCOUNTER — Telehealth: Payer: Self-pay | Admitting: Pulmonary Disease

## 2015-05-01 NOTE — Telephone Encounter (Signed)
This needs an office visit or ER I am already overbooked all week.  If not TP or another MD then ER

## 2015-05-01 NOTE — Telephone Encounter (Signed)
Pt states that she is holding fluids on her right and left leg is very swollen and is having some chest tightness and pressure around rib cage. Pt states that this started around Thanksgiving and has worsened since. Pt took a few Lasix per Dr Warren Lacy and swelling reduced some but came right back.  Pt is wanting to be seen, refuses to schedule with TP at this time. Pt is wanting to be worked in today with Dr Lake Bells if able, does not know if she needs to go to ED.  Please advise Dr Lake Bells. Thanks.

## 2015-05-01 NOTE — Telephone Encounter (Signed)
Called spoke with pt. appt scheduled to see MW this thursday

## 2015-05-03 ENCOUNTER — Ambulatory Visit (INDEPENDENT_AMBULATORY_CARE_PROVIDER_SITE_OTHER)
Admission: RE | Admit: 2015-05-03 | Discharge: 2015-05-03 | Disposition: A | Discharge status: Home / Self Care | Payer: Medicare Other | Source: Ambulatory Visit | Attending: Internal Medicine | Admitting: Internal Medicine

## 2015-05-03 ENCOUNTER — Ambulatory Visit (INDEPENDENT_AMBULATORY_CARE_PROVIDER_SITE_OTHER): Payer: Medicare Other | Admitting: Internal Medicine

## 2015-05-03 ENCOUNTER — Other Ambulatory Visit (INDEPENDENT_AMBULATORY_CARE_PROVIDER_SITE_OTHER): Payer: Medicare Other

## 2015-05-03 ENCOUNTER — Encounter: Payer: Self-pay | Admitting: Internal Medicine

## 2015-05-03 VITALS — BP 110/78 | HR 96 | Ht 60.0 in | Wt 126.0 lb

## 2015-05-03 DIAGNOSIS — R2243 Localized swelling, mass and lump, lower limb, bilateral: Secondary | ICD-10-CM | POA: Diagnosis not present

## 2015-05-03 DIAGNOSIS — R0600 Dyspnea, unspecified: Secondary | ICD-10-CM | POA: Diagnosis not present

## 2015-05-03 LAB — BASIC METABOLIC PANEL
BUN: 9 mg/dL (ref 6–23)
CALCIUM: 9.6 mg/dL (ref 8.4–10.5)
CHLORIDE: 104 meq/L (ref 96–112)
CO2: 30 mEq/L (ref 19–32)
CREATININE: 0.7 mg/dL (ref 0.40–1.20)
GFR: 92.39 mL/min (ref >60.00)
GLUCOSE: 89 mg/dL (ref 70–99)
POTASSIUM: 4.8 meq/L (ref 3.5–5.1)
SODIUM: 139 meq/L (ref 135–145)

## 2015-05-03 LAB — HEPATIC FUNCTION PANEL
ALBUMIN: 4.5 g/dL (ref 3.5–5.2)
ALT: 28 U/L (ref 0–35)
AST: 22 U/L (ref 0–37)
Alkaline Phosphatase: 95 U/L (ref 39–117)
BILIRUBIN TOTAL: 0.6 mg/dL (ref 0.2–1.2)
Bilirubin, Direct: 0.1 mg/dL (ref 0.0–0.3)
Total Protein: 6.8 g/dL (ref 6.0–8.3)

## 2015-05-03 LAB — BRAIN NATRIURETIC PEPTIDE: PRO B NATRI PEPTIDE: 43 pg/mL (ref 0.0–100.0)

## 2015-05-03 LAB — TSH: TSH: 2.65 u[IU]/mL (ref 0.35–4.50)

## 2015-05-03 NOTE — Patient Instructions (Signed)
For leg swelling wear comfortable elastic stockings and take lasix 40 mg once daily until you see Dr Percival Spanish   Please see patient coordinator before you leave today  to schedule venous dopplers both legs  Please remember to go to the lab and x-ray department downstairs for your tests - we will call you with the results when they are available.

## 2015-05-03 NOTE — Progress Notes (Signed)
Subjective:    Patient ID: Elaine Le, female    DOB: 11-13-1960,     MRN: [id number]  Synopsis: Asijah was hospitalized for a severe exacerbation of COPD with pneumonia in early 2016. She established care with the [facility] afterwards. 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  HPI [facility] eval  03/08/15  Chief Complaint  Patient presents with  . Follow-up    pt c/o "not being able to breathe as she should be", doesn't know if it's lungs or sinuses.  denies sob, cough, mucus production.     Porter has been struggling with her weight since the surgery. She says that she has gained about twenty pounds since then. She thinks that her thyroid is causing the weight gain. She has been exercising a lot lately.   She feels really sleepy and falls a sleep easily. She sleeps OK at night, but notes some insomnia.  Does not wake up short of breath, not gasping for air. She has had a dry throat a few times. She knows that she snores because she woke up snoring. The breathing has been not as good since the heart surgery.  Albuterol doesn't help with her breathing. She does not cough up mucus or wheeze. rec Pulmonary rehab      05/03/2015  f/u ov/Mikiah Durall re:  Chief Complaint  Patient presents with  . Acute Visit    Pt c/o swelling in her legs, groin, and neck for the past 6 wks. She also c/o bloating which makes her feel pressure in her chest. She denies any SOB.   dr hochrein rec Lasix 40 mg take prn and not satisfied that it has fixed the problem but doesn't take the full dose regularly / always tends to swell worse on the R than the L   No obvious day to day or daytime variability or assoc chronic cough or cp or chest tightness, subjective  wheeze or overt sinus or hb symptoms. No unusual exp hx or h/o childhood pna/ asthma or knowledge of premature birth.  Sleeping ok without nocturnal  or early am exacerbation  of respiratory  c/o's or need for noct saba. Also denies any obvious fluctuation of symptoms with weather or environmental changes or other aggravating or alleviating factors except as outlined above   Current Medications, Allergies, Complete Past Medical History, Past Surgical History, Family History, and Social History were reviewed in [employer] record.  ROS  The following are not active complaints unless bolded sore throat, dysphagia, dental problems, itching, sneezing,  nasal congestion or excess/ purulent secretions, ear ache,   fever, chills, sweats, unintended wt loss, classically pleuritic or exertional cp, hemoptysis,  orthopnea pnd or leg swelling, presyncope, palpitations, abdominal pain, anorexia, nausea, vomiting, diarrhea  or change in bowel or bladder habits, change in stools or urine, dysuria,hematuria,  rash, arthralgias, visual complaints, headache, numbness, weakness or ataxia or problems with walking or coordination,  change in mood/affect or memory.               Objective:   amb wf with unsual affect   failed to answer a single question asked in a straightforward manner, tending to go off on tangents or answer questions with ambiguous medical terms or diagnoses  .   Wt  Readings from Last 3 Encounters:  05/03/15 126 lb (57.153 kg)  04/06/15 128 lb 3.2 oz (58.151 kg)  03/29/15 127 lb (57.607 kg)    Vital signs reviewed   Gen: well appearing HENT: OP clear, TM's clear, neck supple PULM: CTA B, normal percussion CV: RRR, no mgr, trace edema R foot and ankle  GI: BS+, soft, nontender/ no ascites apparent  Derm: no cyanosis or rash     CXR PA and Lateral:   05/03/2015 :    I personally reviewed images and agree with radiology impression as follows:    1. Cardiac valve  replacement. Heart size normal. 2. No focal infiltrate.  Labs ordered/ reviewed:      Chemistry      Component Value Date/Time   NA 139 05/03/2015 1709   K 4.8 05/03/2015 1709   CL 104 05/03/2015 1709   CO2 30 05/03/2015 1709   BUN 9 05/03/2015 1709   CREATININE 0.70 05/03/2015 1709      Component Value Date/Time   CALCIUM 9.6 05/03/2015 1709   ALKPHOS 95 05/03/2015 1709   AST 22 05/03/2015 1709   ALT 28 05/03/2015 1709   BILITOT 0.6 05/03/2015 1709        Lab Results  Component Value Date   WBC 7.9 02/01/2015   HGB 15.1* 02/01/2015   HCT 45.3 02/01/2015   MCV 86.1 02/01/2015   PLT 292.0 02/01/2015     Lab Results  Component Value Date   DDIMER <0.27 10/06/2014      Lab Results  Component Value Date   TSH 2.65 05/03/2015     Lab Results  Component Value Date   PROBNP 43.0 05/03/2015               Assessment & Plan:

## 2015-05-07 DIAGNOSIS — R2243 Localized swelling, mass and lump, lower limb, bilateral: Secondary | ICD-10-CM | POA: Insufficient documentation

## 2015-05-07 NOTE — Assessment & Plan Note (Signed)
There is very little evidence of sign swelling on today's exam and this has already been addressed by Dr Percival Spanish with no evidence of chf/ cri/ hypothyroid or hypoalb so will complete the w/u with venous dopplers and refer back to Dr Percival Spanish  Advised on use of elastic hose/ prn lasix   I had an extended discussion with the patient reviewing all relevant studies completed to date and  lasting 15 to 20 minutes of a 25 minute visit    Each maintenance medication was reviewed in detail including most importantly the difference between maintenance and prns and under what circumstances the prns are to be triggered using an action plan format that is not reflected in the computer generated alphabetically organized AVS.    Please see instructions for details which were reviewed in writing and the patient given a copy highlighting the part that I personally wrote and discussed at today's ov.

## 2015-05-07 NOTE — Progress Notes (Signed)
Quick Note:  Spoke with pt and notified of results per Dr. Wert. Pt verbalized understanding and denied any questions.  ______ 

## 2015-05-07 NOTE — Assessment & Plan Note (Signed)
Her copd is mild/ she has no limiting sob and the swelling is very unlikely to represent any form of cor pulmonale   rec continue rehab/ f/u with Dr Lake Bells

## 2015-05-10 ENCOUNTER — Ambulatory Visit (HOSPITAL_COMMUNITY)
Admission: RE | Admit: 2015-05-10 | Discharge: 2015-05-10 | Disposition: A | Discharge status: Home / Self Care | Payer: Medicare Other | Source: Ambulatory Visit | Attending: Cardiology | Admitting: Cardiology

## 2015-05-10 DIAGNOSIS — R2243 Localized swelling, mass and lump, lower limb, bilateral: Secondary | ICD-10-CM

## 2015-05-10 DIAGNOSIS — M7989 Other specified soft tissue disorders: Secondary | ICD-10-CM | POA: Diagnosis present

## 2015-05-10 DIAGNOSIS — F17200 Nicotine dependence, unspecified, uncomplicated: Secondary | ICD-10-CM | POA: Diagnosis not present

## 2015-05-10 DIAGNOSIS — R600 Localized edema: Secondary | ICD-10-CM

## 2015-05-10 DIAGNOSIS — R0600 Dyspnea, unspecified: Secondary | ICD-10-CM | POA: Diagnosis not present

## 2015-05-11 NOTE — Progress Notes (Signed)
Quick Note:  Spoke with pt and notified of results per Dr. Wert. Pt verbalized understanding and denied any questions.  ______ 

## 2015-05-30 ENCOUNTER — Ambulatory Visit (INDEPENDENT_AMBULATORY_CARE_PROVIDER_SITE_OTHER): Payer: Medicare Other | Admitting: Internal Medicine

## 2015-05-30 ENCOUNTER — Encounter: Payer: Self-pay | Admitting: Internal Medicine

## 2015-05-30 ENCOUNTER — Other Ambulatory Visit (INDEPENDENT_AMBULATORY_CARE_PROVIDER_SITE_OTHER): Payer: Medicare Other

## 2015-05-30 VITALS — BP 116/82 | HR 80 | Temp 98.2°F | Wt 126.5 lb

## 2015-05-30 DIAGNOSIS — E039 Hypothyroidism, unspecified: Secondary | ICD-10-CM

## 2015-05-30 DIAGNOSIS — R635 Abnormal weight gain: Secondary | ICD-10-CM

## 2015-05-30 LAB — T4, FREE: FREE T4: 0.96 ng/dL (ref 0.60–1.60)

## 2015-05-30 LAB — TSH: TSH: 2.16 u[IU]/mL (ref 0.35–4.50)

## 2015-05-30 NOTE — Patient Instructions (Signed)
We will check the labs today and call you back with the results and mail you a copy.

## 2015-05-30 NOTE — Progress Notes (Signed)
Pre visit review using our clinic review tool, if applicable. No additional management support is needed unless otherwise documented below in the visit note. 

## 2015-06-01 NOTE — Progress Notes (Signed)
   Subjective:    Patient ID: Elaine Le, female    DOB: 1961-04-25, 55 y.o.   MRN: [id number]  HPI The patient is a 55 YO female coming in for follow up on her thyroid levels. She feels that the levels are too low and we did change her dosing recently. She has not noticed much benefit and would like more change. Still with fatigue and weight gain. She is not able to lose the weight she has gained and this makes her very unhappy. No new constipation or diarrhea or tremors.   Review of Systems  Constitutional: Positive for fatigue. Negative for fever, activity change and appetite change.  HENT: Negative for postnasal drip, rhinorrhea and sinus pressure.   Respiratory: Negative for cough, chest tightness, shortness of breath and wheezing.   Cardiovascular: Negative for chest pain, palpitations and leg swelling.  Gastrointestinal: Negative for nausea, abdominal pain, diarrhea, constipation and abdominal distention.  Musculoskeletal: Negative.   Skin: Negative.   Neurological: Positive for weakness. Negative for dizziness, speech difficulty, light-headedness and headaches.  Psychiatric/Behavioral: Negative.       Objective:   Physical Exam  Constitutional: She is oriented to person, place, and time. She appears well-developed and well-nourished.  HENT:  Head: Normocephalic and atraumatic.  Eyes: EOM are normal.  Neck: Normal range of motion.  Cardiovascular: Normal rate.   Valve click  Pulmonary/Chest: Effort normal and breath sounds normal.  Abdominal: Soft. Bowel sounds are normal. She exhibits no distension. There is no tenderness. There is no rebound.  Musculoskeletal: She exhibits no edema.  Neurological: She is alert and oriented to person, place, and time.  Skin: Skin is warm and dry.   Filed Vitals:   05/30/15 1045  BP: 116/82  Pulse: 80  Temp: 98.2 F (36.8 C)  Weight: 126 lb 8 oz (57.38 kg)  SpO2: 96%      Assessment & Plan:

## 2015-06-01 NOTE — Assessment & Plan Note (Signed)
Levels previously not at goal and dose change made at last visit. Is now taking 75 mcg daily. Checking TSH and free T4 today and adjust as needed. Still having some symptoms which are compatible with low thyroid levels.

## 2015-06-01 NOTE — Assessment & Plan Note (Signed)
Weight is fairly stable recently and she is actually at a healthy weight which was discussed with her. She wishes to be 103 which is where she has always been. This may not be a reasonable goal.

## 2015-06-04 ENCOUNTER — Telehealth: Payer: Self-pay | Admitting: Internal Medicine

## 2015-06-04 NOTE — Telephone Encounter (Signed)
Patient called regarding lab results. I Read interpretation to her. She states that she is experiencing an avg weight gain of 1 lb/ day and feels like she's being "blown up like a tire". She became upset and began to cry. States that she might go to the hospital. Please give the patient a call to discuss.

## 2015-06-04 NOTE — Telephone Encounter (Signed)
Patient says she is tired, her legs are hurting, her hair is falling out, she isn't sleeping well and she has been gaining weight. She weighs 127 today. She said she wants to know if she should go to the ER or if she needs to come in and be seen. Please advise, thanks.

## 2015-06-04 NOTE — Telephone Encounter (Signed)
This is the same weight she was at her visit in December and January and February. I do not see a good reason for her to go to the ER.

## 2015-06-04 NOTE — Telephone Encounter (Signed)
Pt called back to check up on this, please leave detail massage if she does not answer (she has to go out).

## 2015-06-04 NOTE — Telephone Encounter (Signed)
Called and left message on voicemail informing patient of Dr. Charlynne Cousins message.

## 2015-07-25 ENCOUNTER — Telehealth: Payer: Self-pay | Admitting: Cardiology

## 2015-07-25 ENCOUNTER — Other Ambulatory Visit: Payer: Self-pay

## 2015-07-25 ENCOUNTER — Telehealth: Payer: Self-pay | Admitting: Internal Medicine

## 2015-07-25 MED ORDER — AMOXICILLIN 500 MG PO CAPS: 2000.0000 mg | ORAL_CAPSULE | Freq: Once | ORAL | Status: DC

## 2015-07-25 MED ORDER — POLYETHYLENE GLYCOL 3350 17 GM/SCOOP PO POWD: 1.0000 | Freq: Every day | ORAL | Status: DC | PRN

## 2015-07-25 NOTE — Telephone Encounter (Signed)
New Message  Pt called states that she will need an antibiotic to get her teeth cleaned. Please call back to discuss.

## 2015-07-25 NOTE — Telephone Encounter (Signed)
Pt states she is constipated and needs a refill on her miralax. States the miralax is not working. Pt is only taking one dose daily. Pt instructed to take 2-3 doses of miralax to have BM. Pt also wanted to let us know that sometimes her stool is in the shape of balls and sometimes it is like a cow patty that sticks to everything. Also reports that her stool is sometimes "not the right color." Pt instructed to try the miralax 2-3 doses and refill sent to pharmacy. Pt to keep her scheduled OV.

## 2015-07-25 NOTE — Telephone Encounter (Signed)
PER TRIAGE PROTOCOL  PATIENT HAS MITRAL VALVE REPLACEMENT WITH BIOPROSTHESIS VALVE  AMOXCILLIN 2 GM  ONE HOUR PRIOR TO DENTAL WORK PATIENT VERBALIZED UNDERSTANDING

## 2015-07-27 ENCOUNTER — Other Ambulatory Visit: Payer: Self-pay | Admitting: Internal Medicine

## 2015-08-23 NOTE — Progress Notes (Signed)
Cardiology Office Note   Date:  08/24/2015   ID:  Elaine Le, DOB 1960-12-21, MRN [id number]  PCP:  Hoyt Koch, MD  Cardiologist:   Minus Breeding, MD   No chief complaint on file.     History of Present Illness: Elaine Le is a 55 y.o. female who presents for follow-up after mitral valve replacement. She had probable rheumatic disease with severe regurgitation and moderate stenosis and is now status post minimally invasive valve replacement.  The patient denies any new shortness of breath, PND or orthopnea. There have been no reported palpitations, presyncope or syncope.  She has had no fevers or chills.      Her biggest complaint has been hot flashes even though she is no longer menopausal.  She also has had an intermittent skin rash.  She was given steroids for this.  The etiology was not clear.    Past Medical History  Diagnosis Date  . Chronic sinusitis   . Arthritis   . Fibromyalgia   . GERD (gastroesophageal reflux disease)   . Irritable bowel syndrome (IBS)   . Headaches, cluster   . Back pain   . Fatigue   . Muscle pain   . Night sweats   . Diverticulosis   . Adenomatous colon polyp   . Internal hemorrhoids   . Hiatal hernia   . Pneumonia   . Mitral valve regurgitation     severe  . COPD (chronic obstructive pulmonary disease)-PFT pending  07/17/2014  . Community acquired pneumonia   . CN (constipation) 05/25/2014  . Acute respiratory failure with hypercapnia ([place]) 06/21/2014  . Hx of adenomatous colonic polyps 03/10/2011    Oct 2012, repeat colon Oct 2017   . IBS (irritable bowel syndrome) 01/21/2011  . Influenza A 06/29/2014  . Muscular deconditioning 07/05/2014  . Pleural effusion   . Protein-calorie malnutrition, severe ([place]) 07/06/2014  . Chronic pain syndrome   . Tobacco abuse   . Chronic diastolic heart failure (HCC)     related to MR/MS  . 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/10/2014    Severe mitral regurgitation with  moderate mitral stenosis  . Severe mitral regurgitation by prior echocardiogram 07/11/2014  . Mitral valve stenosis, moderate 07/11/2014  . Acute systolic heart failure ([place]) 09/2014  . S/P minimally invasive mitral valve replacement with bioprosthetic valve 11/08/2014    25 mm [facility] Mitral bovine bioprosthetic tissue valve placed via right mini thoracotomy approach    Past Surgical History  Procedure Laterality Date  . Thoracic outlet surgery    . Tubal ligation    . Vulva surgery    . Neuroplasty / transposition median nerve at carpal tunnel bilateral    . Cystostomy w/ bladder dilation      at age 40  . Tee without cardioversion N/A 07/10/2014    Procedure: TRANSESOPHAGEAL ECHOCARDIOGRAM (TEE);  Surgeon: Lelon Perla, MD;  Location: [facility] ENDOSCOPY;  Service: Cardiovascular;  55-60%. No regional wall motion modalities. Moderate mitral stenosis with severe regurgitation and moderate to severely dilated left atrium.  . Left and right heart catheterization with coronary angiogram N/A 07/27/2014    Procedure: LEFT AND RIGHT HEART CATHETERIZATION WITH CORONARY ANGIOGRAM;  Surgeon: Burnell Blanks, MD;  Location: [facility] CATH LAB;  Right left heart catheterization: Mild/minimal coronary artery disease. RV: 50/7/17, PA: 48/29 (mean 38), PCWP 33 mmHg  . Transthoracic echocardiogram  07/04/2014    Normal LV Size/Fnx - EF 60-65% no RWMA; MV: thickened  leaflets with doming, fixed Posterior leaflet, anterior leaflet w/ large/shaggy & mobile density.  c/w Rheumatic Mitral disease - moderate MS & Mod-Severe MR.  . Mitral valve repair Right 11/08/2014    Procedure: MINIMALLY INVASIVE MITRAL VALVE REPLACEMENT(MVR);  Surgeon: Rexene Alberts, MD;  Location: [place];  Service: Open Heart Surgery;  Laterality: Right;  . Tee without cardioversion N/A 11/08/2014    Procedure: TRANSESOPHAGEAL ECHOCARDIOGRAM (TEE);  Surgeon: Rexene Alberts, MD;  Location: [place];  Service: Open Heart Surgery;  Laterality: N/A;      Current Outpatient Prescriptions  Medication Sig Dispense Refill  . amoxicillin (AMOXIL) 500 MG capsule Take 4 capsules (2,000 mg total) by mouth once. ONE HOUR PRIOR TO DENTAL WORK 4 capsule 2  . aspirin 81 MG tablet Take 162 mg by mouth daily.    . clotrimazole (GYNE-LOTRIMIN) 1 % vaginal cream Place 1 Applicatorful vaginally at bedtime. 45 g 0  . Coenzyme Q10 (CO Q 10) 10 MG CAPS Take by mouth.    . loratadine (CLARITIN) 5 MG chewable tablet Chew 5 mg by mouth daily. Reported on 05/30/2015    . polyethylene glycol powder (GLYCOLAX) powder Take 255 g by mouth daily as needed. 255 g 12  . sodium chloride (OCEAN) 0.65 % nasal spray Place 2 sprays into both nostrils every 4 (four) hours as needed for congestion.     Marland Kitchen SYNTHROID 75 MCG tablet TAKE 1 TABLET (75 MCG TOTAL) BY MOUTH DAILY BEFORE BREAKFAST. 30 tablet 3  . [DISCONTINUED] pantoprazole (PROTONIX) 40 MG tablet Take 1 tablet (40 mg total) by mouth daily. 30 tablet 11   No current facility-administered medications for this visit.    Allergies:   Morphine and related; Other; and Warfarin and related    ROS:  Please see the history of present illness.   Otherwise, review of systems are positive for none.   All other systems are reviewed and negative.    PHYSICAL EXAM: VS:  BP 104/70 mmHg  Pulse 98  Ht 5' (1.524 m)  Wt 126 lb 2 oz (57.21 kg)  BMI 24.63 kg/m2 , BMI Body mass index is 24.63 kg/(m^2). GENERAL:  Well appearing NECK:  No jugular venous distention, waveform within normal limits, carotid upstroke brisk and symmetric, Transmitted murmur most likely versus left bruit, no thyromegaly LYMPHATICS:  No cervical, inguinal adenopathy LUNGS:  Clear to auscultation bilaterally BACK:  No CVA tenderness CHEST:  Her left thoracotomy wound well healed. HEART:  PMI not displaced or sustained,S1 and S2 within normal limits, no S3, no S4, no clicks, no rubs, 2 out of 6 brief systolic murmur, no diastolic murmurs ABD:  Flat,  positive bowel sounds normal in frequency in pitch, no bruits, no rebound, no guarding, no midline pulsatile mass, no hepatomegaly, no splenomegaly EXT:  2 plus pulses throughout, trace right lower extremity edema, no cyanosis no clubbing   EKG:  EKG is not ordered today.   Recent Labs: 11/09/2014: Magnesium 2.1 01/10/2015: ALT 27; BUN 9; Creatinine, Ser 0.70; Potassium 4.3; Sodium 139 02/01/2015: Hemoglobin 15.1*; Platelets 292.0 03/29/2015: TSH 4.03    Lipid Panel    Component Value Date/Time   TRIG 115 06/28/2014 1400      Wt Readings from Last 3 Encounters:  08/24/15 126 lb 2 oz (57.21 kg)  05/30/15 126 lb 8 oz (57.38 kg)  05/03/15 126 lb (57.153 kg)      Other studies Reviewed: Additional studies/ records that were reviewed today include: None    ASSESSMENT  AND PLAN:  MVR:  She is doing well.  I will repeat an echo in July at the one year anniversary of her valve repair.     ATRIAL FIB:  She's had no recurrent arrhythmia. She is off the amiodarone.    HOT FLASHES:  Her TSH was normal.  As this is her most significant complaint I will refer her to an endocrinologist to consider whether there could be a hormonal issue.    Current medicines are reviewed at length with the patient today.  The patient does not have concerns regarding medicines.  The following changes have been made:  no change  Labs/ tests ordered today include: None     Disposition:   FU with me in 6 months.    Signed, Minus Breeding, MD  08/24/2015 2:36 PM    [facility]

## 2015-08-24 ENCOUNTER — Encounter: Payer: Self-pay | Admitting: Cardiology

## 2015-08-24 ENCOUNTER — Ambulatory Visit (INDEPENDENT_AMBULATORY_CARE_PROVIDER_SITE_OTHER): Payer: Medicare Other | Admitting: Cardiology

## 2015-08-24 VITALS — BP 104/70 | HR 98 | Ht 60.0 in | Wt 126.1 lb

## 2015-08-24 DIAGNOSIS — Z954 Presence of other heart-valve replacement: Secondary | ICD-10-CM

## 2015-08-24 DIAGNOSIS — E038 Other specified hypothyroidism: Secondary | ICD-10-CM

## 2015-08-24 DIAGNOSIS — R232 Flushing: Secondary | ICD-10-CM

## 2015-08-24 DIAGNOSIS — N951 Menopausal and female climacteric states: Secondary | ICD-10-CM

## 2015-08-24 DIAGNOSIS — Z952 Presence of prosthetic heart valve: Secondary | ICD-10-CM

## 2015-08-24 NOTE — Patient Instructions (Addendum)
Your physician has requested that you have an echocardiogram. Echocardiography is a painless test that uses sound waves to create images of your heart. It provides your doctor with information about the size and shape of your heart and how well your heart's chambers and valves are working. This procedure takes approximately one hour. There are no restrictions for this procedure.  You have have been referred to Dr Delrae Rend, an endocrinologist, for hot flashes and hypothyroidism.  Dr Percival Spanish recommends that you schedule a follow-up appointment in 6 months. You will receive a reminder letter in the mail two months in advance. If you don't receive a letter, please call our office to schedule the follow-up appointment.  If you need a refill on your cardiac medications before your next appointment, please call your pharmacy.

## 2015-08-28 ENCOUNTER — Encounter: Payer: Self-pay | Admitting: Cardiology

## 2015-09-04 ENCOUNTER — Telehealth: Payer: Self-pay | Admitting: Cardiology

## 2015-09-04 NOTE — Telephone Encounter (Signed)
Per cardiology protocols, patient meets infective endocarditis prophylaxis criteria for amoxicillin 2,000mg  dose - due to history of mitral valve replacement w/ bioprosthetic valve. I have spoken with both the patient and dental practice concerning our clinical guidelines. This note furnished to dentist's office for purposes of their documentation and reference.

## 2015-09-04 NOTE — Telephone Encounter (Signed)
PATIENT CALLED 1. What dental office are you calling from? [facility]  2. What is your office phone and fax number? (p) [phone] (f) [phone]  3. What type of procedure is the patient having performed? Tooth extraction   4. What date is procedure scheduled? pending   5. What is your question (ex. Antibiotics prior to procedure, holding medication-we need to know how long dentist wants pt to hold med)? Can she take amoxicillin 500mg ( 4 tabs)

## 2015-09-10 ENCOUNTER — Telehealth: Payer: Self-pay | Admitting: Cardiology

## 2015-09-10 NOTE — Telephone Encounter (Signed)
I would not suggest phenyephrine

## 2015-09-10 NOTE — Telephone Encounter (Signed)
Elaine Le is calling because she is wanting to know can she take a decongestant(Phenylephrine 10mg ) . Please call  Thanks

## 2015-09-10 NOTE — Telephone Encounter (Signed)
Spoke with pt, aware we do not recommend decongestants for cardiac patients. She has been taking claritin with no relief. She has congestion in her sinuses and behind her eyes. Advised pt to see her primary care physician but she wants to know from dr hochrein if she can take this medication. Forwarded to dr hochrein for review ans advise.

## 2015-09-11 ENCOUNTER — Ambulatory Visit (HOSPITAL_COMMUNITY): Payer: Medicare Other | Attending: Cardiology

## 2015-09-11 ENCOUNTER — Other Ambulatory Visit: Payer: Self-pay

## 2015-09-11 DIAGNOSIS — Z954 Presence of other heart-valve replacement: Secondary | ICD-10-CM | POA: Diagnosis not present

## 2015-09-11 DIAGNOSIS — Z87891 Personal history of nicotine dependence: Secondary | ICD-10-CM | POA: Diagnosis not present

## 2015-09-11 DIAGNOSIS — I5189 Other ill-defined heart diseases: Secondary | ICD-10-CM | POA: Diagnosis not present

## 2015-09-11 DIAGNOSIS — I059 Rheumatic mitral valve disease, unspecified: Secondary | ICD-10-CM | POA: Diagnosis present

## 2015-09-11 DIAGNOSIS — I340 Nonrheumatic mitral (valve) insufficiency: Secondary | ICD-10-CM | POA: Insufficient documentation

## 2015-09-11 DIAGNOSIS — Z953 Presence of xenogenic heart valve: Secondary | ICD-10-CM | POA: Diagnosis not present

## 2015-09-11 DIAGNOSIS — I071 Rheumatic tricuspid insufficiency: Secondary | ICD-10-CM | POA: Insufficient documentation

## 2015-09-11 DIAGNOSIS — Z952 Presence of prosthetic heart valve: Secondary | ICD-10-CM

## 2015-09-11 NOTE — Telephone Encounter (Signed)
Spoke with pt letting her kno Dr Percival Spanish does not recommend her taking a decongestion, pt stated she has been taking it for years and it haven't hurt her. Advise her that's what Dr Percival Spanish recommend

## 2015-09-11 NOTE — Telephone Encounter (Signed)
Leave message for pt to call back 

## 2015-09-28 ENCOUNTER — Encounter: Payer: Self-pay | Admitting: Internal Medicine

## 2015-09-28 ENCOUNTER — Ambulatory Visit (INDEPENDENT_AMBULATORY_CARE_PROVIDER_SITE_OTHER): Payer: Medicare Other | Admitting: Internal Medicine

## 2015-09-28 VITALS — BP 130/84 | HR 76 | Ht 60.0 in | Wt 127.2 lb

## 2015-09-28 DIAGNOSIS — Z86010 Personal history of colonic polyps: Secondary | ICD-10-CM | POA: Diagnosis not present

## 2015-09-28 DIAGNOSIS — E039 Hypothyroidism, unspecified: Secondary | ICD-10-CM

## 2015-09-28 DIAGNOSIS — K581 Irritable bowel syndrome with constipation: Secondary | ICD-10-CM | POA: Diagnosis not present

## 2015-09-28 NOTE — Patient Instructions (Addendum)
Continue Miralax.  Please purchase the following medications over the counter and take as directed: Benefiber 1 tablespoon daily  You will be due for a recall colonoscopy in 01/2016. We will send you a reminder in the mail when it gets closer to that time.  Please follow up in the office in 1 year.  We have contacted [facility] regarding getting you an appointment for hypothyoidism. They should contact you with an appointment time and date. If you do not hear from them within the next 1 week, please give [place] a call at [phone].  If you are age 68 or older, your body mass index should be between 23-30. Your Body mass index is 24.84 kg/(m^2). If this is out of the aforementioned range listed, please consider follow up with your Primary Care Provider.  If you are age 76 or younger, your body mass index should be between 19-25. Your Body mass index is 24.84 kg/(m^2). If this is out of the aformentioned range listed, please consider follow up with your Primary Care Provider.

## 2015-09-28 NOTE — Progress Notes (Signed)
Subjective:    Patient ID: Elaine Le, female    DOB: Jul 09, 1960, 55 y.o.   MRN: [id number]  HPI Elaine Le is a 55 year old female with a history of IBS, adenomatous colon polyps, GERD, fibromyalgia, sacroiliitis, Raynaud's, hypothyroidism, and a relatively recent diagnosis of mitral valve disease status post mitral valve replacement with bioprosthetic valve on 11/08/2014 who is here for follow-up. Since her last visit with me she had her mitral valve replaced by Dr. Roxy Manns. She's also stop smoking since February 2016. She has gained about 23 pounds which she relates to hypothyroidism. She has been having bowel movements using MiraLAX 17 g daily. Previously she was using lactulose. She reports at times MiraLAX makes her stool so "mushy" that they are somewhat difficult to pass. She has not had rectal bleeding or melena. She tried Linzess in the past and found it not overly helpful. Her reflux disease has been controlled despite being off of ranitidine therapy. She is using vinegar in the evening which she states helps dramatically. She denies heartburn, dysphagia, odynophagia, dyspepsia, nausea and vomiting.  Also of note in February 2016 she was admitted with bilateral pneumonia felt secondary to influenza. She spent time in the ICU and was intubated during this hospitalization.   Review of Systems As per history of present illness, otherwise negative  Current Medications, Allergies, Past Medical History, Past Surgical History, Family History and Social History were reviewed in [employer] record.     Objective:   Physical Exam BP 130/84 mmHg  Pulse 76  Ht 5' (1.524 m)  Wt 127 lb 3.2 oz (57.698 kg)  BMI 24.84 kg/m2 Constitutional: Well-developed and well-nourished. No distress. HEENT: Normocephalic and atraumatic. Oropharynx is clear and moist. No oropharyngeal exudate. Conjunctivae are normal.  No scleral icterus. Neck: Neck supple. Trachea  midline. Cardiovascular: Normal rate, regular rhythm and intact distal pulses. No M/R/G Pulmonary/chest: Effort normal and breath sounds normal. No wheezing, rales or rhonchi. Abdominal: Soft, nontender, nondistended. Bowel sounds active throughout.  Extremities: no clubbing, cyanosis, or edema Lymphadenopathy: No cervical adenopathy noted. Neurological: Alert and oriented to person place and time. Skin: Skin is warm and dry. No rashes noted. Psychiatric: Normal mood and affect. Behavior is normal.  CBC    Component Value Date/Time   WBC 7.9 02/01/2015 1037   RBC 5.27* 02/01/2015 1037   HGB 15.1* 02/01/2015 1037   HCT 45.3 02/01/2015 1037   PLT 292.0 02/01/2015 1037   MCV 86.1 02/01/2015 1037   MCH 28.6 11/14/2014 0445   MCHC 33.4 02/01/2015 1037   RDW 15.0 02/01/2015 1037   LYMPHSABS 2.0 02/01/2015 1037   MONOABS 0.5 02/01/2015 1037   EOSABS 0.1 02/01/2015 1037   BASOSABS 0.1 02/01/2015 1037    CMP     Component Value Date/Time   NA 139 05/03/2015 1709   K 4.8 05/03/2015 1709   CL 104 05/03/2015 1709   CO2 30 05/03/2015 1709   GLUCOSE 89 05/03/2015 1709   BUN 9 05/03/2015 1709   CREATININE 0.70 05/03/2015 1709   CALCIUM 9.6 05/03/2015 1709   PROT 6.8 05/03/2015 1709   ALBUMIN 4.5 05/03/2015 1709   AST 22 05/03/2015 1709   ALT 28 05/03/2015 1709   ALKPHOS 95 05/03/2015 1709   BILITOT 0.6 05/03/2015 1709   GFRNONAA >60 11/14/2014 0445   GFRAA >60 11/14/2014 0445        Assessment & Plan:  55 year old female with a history of IBS, adenomatous colon polyps, GERD, fibromyalgia, sacroiliitis,  Raynaud's, hypothyroidism, and a relatively recent diagnosis of mitral valve disease status post mitral valve replacement with bioprosthetic valve on 11/08/2014 who is here for follow-up.  1. IBS with constipation -- having some issues with MiraLAX making her stool so soft that are difficult to pass. I've recommended adding Benefiber 1 tablespoon daily to help bulk the stool and  facilitate defecation. She will continue MiraLAX 17 g daily for now. If this is unhelpful, I recommended that she contact me. She voiced understanding.  2. GERD -- diet controlled. Not currently on antacid therapy.  3. Hypothyroidism -- weight gain, fatigue. She requests endocrine referral. Endocrine referral placed. She is on daily Synthroid.  4. History of colon polyp -- surveillance colonoscopy recommended in November 2017. She is aware of this recommendation and will receive a recall letter closer to November.  1 yr follow-up, sooner if needed 15 minutes spent with the patient today. Greater than 50% was spent in counseling and coordination of care with the patient

## 2015-10-08 ENCOUNTER — Encounter: Payer: Self-pay | Admitting: Endocrinology

## 2015-10-08 ENCOUNTER — Ambulatory Visit (INDEPENDENT_AMBULATORY_CARE_PROVIDER_SITE_OTHER): Payer: Medicare Other | Admitting: Endocrinology

## 2015-10-08 VITALS — BP 145/85 | HR 81 | Temp 98.2°F | Ht 60.0 in | Wt 129.0 lb

## 2015-10-08 DIAGNOSIS — E039 Hypothyroidism, unspecified: Secondary | ICD-10-CM

## 2015-10-08 DIAGNOSIS — R635 Abnormal weight gain: Secondary | ICD-10-CM

## 2015-10-08 MED ORDER — DEXAMETHASONE 1 MG PO TABS: ORAL_TABLET | ORAL | Status: DC

## 2015-10-08 NOTE — Progress Notes (Signed)
Subjective:    Patient ID: Elaine Le, female    DOB: 01/28/1961, 55 y.o.   MRN: [id number]  HPI Pt reports hypothyroidism was dx'ed in 2016.  she has never been on prescribed thyroid hormone therapy since then.  she has never taken kelp or any other type of non-prescribed thyroid product.  she has never had thyroid imaging.  She is not considering a pregnancy.  she has never had thyroid surgery, or XRT to the neck.  she has never been on lithium.  She took amiodarone x 1 week only, in 2016.  She has moderate hair loss throughout the head, and assoc weight gain.   Past Medical History  Diagnosis Date  . Chronic sinusitis   . Arthritis   . Fibromyalgia   . GERD (gastroesophageal reflux disease)   . Irritable bowel syndrome (IBS)   . Headaches, cluster   . Back pain   . Fatigue   . Muscle pain   . Night sweats   . Diverticulosis   . Adenomatous colon polyp   . Internal hemorrhoids   . Hiatal hernia   . Pneumonia   . Mitral valve regurgitation     severe  . COPD (chronic obstructive pulmonary disease)-PFT pending  07/17/2014  . Community acquired pneumonia   . CN (constipation) 05/25/2014  . Acute respiratory failure with hypercapnia ([place]) 06/21/2014  . Hx of adenomatous colonic polyps 03/10/2011    Oct 2012, repeat colon Oct 2017   . IBS (irritable bowel syndrome) 01/21/2011  . Influenza A 06/29/2014  . Muscular deconditioning 07/05/2014  . Pleural effusion   . Protein-calorie malnutrition, severe ([place]) 07/06/2014  . Chronic pain syndrome   . Tobacco abuse   . Chronic diastolic heart failure (HCC)     related to MR/MS  . 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/10/2014    Severe mitral regurgitation with moderate mitral stenosis  . Severe mitral regurgitation by prior echocardiogram 07/11/2014  . Mitral valve stenosis, moderate 07/11/2014  . Acute systolic heart failure ([place]) 09/2014  . S/P minimally invasive mitral valve replacement with bioprosthetic valve 11/08/2014    25 mm  [facility] Mitral bovine bioprosthetic tissue valve placed via right mini thoracotomy approach    Past Surgical History  Procedure Laterality Date  . Thoracic outlet surgery    . Tubal ligation    . Vulva surgery    . Neuroplasty / transposition median nerve at carpal tunnel bilateral    . Cystostomy w/ bladder dilation      at age 74  . Tee without cardioversion N/A 07/10/2014    Procedure: TRANSESOPHAGEAL ECHOCARDIOGRAM (TEE);  Surgeon: Lelon Perla, MD;  Location: [facility] ENDOSCOPY;  Service: Cardiovascular;  55-60%. No regional wall motion modalities. Moderate mitral stenosis with severe regurgitation and moderate to severely dilated left atrium.  . Left and right heart catheterization with coronary angiogram N/A 07/27/2014    Procedure: LEFT AND RIGHT HEART CATHETERIZATION WITH CORONARY ANGIOGRAM;  Surgeon: Burnell Blanks, MD;  Location: [facility] CATH LAB;  Right left heart catheterization: Mild/minimal coronary artery disease. RV: 50/7/17, PA: 48/29 (mean 38), PCWP 33 mmHg  . Transthoracic echocardiogram  07/04/2014    Normal LV Size/Fnx - EF 60-65% no RWMA; MV: thickened leaflets with doming, fixed Posterior leaflet, anterior leaflet w/ large/shaggy & mobile density.  c/w Rheumatic Mitral disease - moderate MS & Mod-Severe MR.  . Mitral valve repair Right 11/08/2014    Procedure: MINIMALLY INVASIVE MITRAL VALVE REPLACEMENT(MVR);  Surgeon: Valentina Gu  Roxy Manns, MD;  Location: [place];  Service: Open Heart Surgery;  Laterality: Right;  . Tee without cardioversion N/A 11/08/2014    Procedure: TRANSESOPHAGEAL ECHOCARDIOGRAM (TEE);  Surgeon: Rexene Alberts, MD;  Location: [place];  Service: Open Heart Surgery;  Laterality: N/A;    Social History   Social History  . Marital Status: Single    Spouse Name: N/A  . Number of Children: 1  . Years of Education: N/A   Occupational History  . Not on file.   Social History Main Topics  . Smoking status: Former Smoker -- 0.50 packs/day for 35 years     Types: Cigarettes    Quit date: 06/17/2014  . Smokeless tobacco: Never Used  . Alcohol Use: No     Comment: rare  . Drug Use: No  . Sexual Activity: No   Other Topics Concern  . Not on file   Social History Narrative    Current Outpatient Prescriptions on File Prior to Visit  Medication Sig Dispense Refill  . Coenzyme Q10 (CO Q 10) 10 MG CAPS Take by mouth. Reported on 10/08/2015    . SYNTHROID 75 MCG tablet TAKE 1 TABLET (75 MCG TOTAL) BY MOUTH DAILY BEFORE BREAKFAST. 30 tablet 3  . amoxicillin (AMOXIL) 500 MG capsule Take 4 capsules (2,000 mg total) by mouth once. ONE HOUR PRIOR TO DENTAL WORK (Patient not taking: Reported on 10/08/2015) 4 capsule 2  . aspirin 81 MG tablet Take 162 mg by mouth daily.    . clotrimazole (GYNE-LOTRIMIN) 1 % vaginal cream Place 1 Applicatorful vaginally at bedtime. (Patient not taking: Reported on 10/08/2015) 45 g 0  . loratadine (CLARITIN) 5 MG chewable tablet Chew 5 mg by mouth daily. Reported on 10/08/2015    . polyethylene glycol powder (GLYCOLAX) powder Take 255 g by mouth daily as needed. (Patient not taking: Reported on 10/08/2015) 255 g 12  . sodium chloride (OCEAN) 0.65 % nasal spray Place 2 sprays into both nostrils every 4 (four) hours as needed for congestion.     . [DISCONTINUED] pantoprazole (PROTONIX) 40 MG tablet Take 1 tablet (40 mg total) by mouth daily. 30 tablet 11   No current facility-administered medications on file prior to visit.    Allergies  Allergen Reactions  . Morphine And Related Other (See Comments)    Altered mental state  . Other Other (See Comments)    Any antidepressants per pt- causes altered mental state, anger  . Warfarin And Related     Family History  Problem Relation Age of Onset  . Kidney cancer Mother   . Colon polyps Mother   . Irritable bowel syndrome Mother   . Diabetes Sister   . Liver disease Sister   . Irritable bowel syndrome Daughter   . Diabetes Brother   . Colon cancer Neg Hx   . Thyroid  disease Neg Hx     BP 145/85 mmHg  Pulse 81  Temp(Src) 98.2 F (36.8 C) (Oral)  Ht 5' (1.524 m)  Wt 129 lb (58.514 kg)  BMI 25.19 kg/m2  SpO2 98%  Review of Systems denies depression, sob, blurry vision, cold intolerance, easy bruising, and syncope.  She has fatigue, constipation, hot flashes, acral numbness, dry skin, nasal congestion, and leg cramps.      Objective:   Physical Exam VS: see vs page GEN: no distress HEAD: head: no deformity.  Hair on head is normal for age.   eyes: no periorbital swelling, no proptosis external nose and ears  are normal mouth: no lesion seen NECK: supple, thyroid is not enlarged.  I cannot appreciate the nodule CHEST WALL: no deformity LUNGS: clear to auscultation CV: reg rate and rhythm, no murmur ABD: abdomen is soft, nontender.  no hepatosplenomegaly.  not distended.  no hernia MUSCULOSKELETAL: muscle bulk and strength are grossly normal.  no obvious joint swelling.  gait is normal and steady EXTEMITIES: no deformity.  no ulcer on the feet.  feet are of normal color and temp.  Trace right leg edema PULSES: dorsalis pedis intact bilat.  no carotid bruit NEURO:  cn 2-12 grossly intact.   readily moves all 4's.  sensation is intact to touch on the feet SKIN:  Normal texture and temperature.  No rash or suspicious lesion is visible.   NODES:  None palpable at the neck PSYCH: alert, well-oriented.  Does not appear anxious nor depressed.    Chest CT (2003): QUESTION 7 MM DIAMETER NODULE, LOWER POLE LEFT THYROID LOBE.  Lab Results  Component Value Date   TSH 2.16 05/30/2015   T4TOTAL CANCELED 02/14/2015   I have reviewed outside records, and summarized: Pt was noted to have constipation and fatigue, despite synthroid, and ref here    Assessment & Plan:  Thyroid nodule, new to me, nonpalpable. Weight gain: not thyroid-related. Hair loss: not thyroid-related.  Patient is advised the following: Patient Instructions  you should do a  "dexamethasone suppression test."  for this, you would take dexamethasone 1 mg at 10 pm, then come in for a "cortisol" blood test the next morning before 9 am.  you do not need to be fasting for this test.  We'll check other thyroid blood tests at the same time.  I would be happy to see you back here as needed.    Renato Shin, MD

## 2015-10-08 NOTE — Progress Notes (Signed)
Pre visit review using our clinic tool,if applicable. No additional management support is needed unless otherwise documented below in the visit note.  

## 2015-10-08 NOTE — Patient Instructions (Addendum)
you should do a "dexamethasone suppression test."  for this, you would take dexamethasone 1 mg at 10 pm, then come in for a "cortisol" blood test the next morning before 9 am.  you do not need to be fasting for this test.  We'll check other thyroid blood tests at the same time.  I would be happy to see you back here as needed.

## 2015-10-10 ENCOUNTER — Other Ambulatory Visit (INDEPENDENT_AMBULATORY_CARE_PROVIDER_SITE_OTHER): Payer: Medicare Other

## 2015-10-10 DIAGNOSIS — E039 Hypothyroidism, unspecified: Secondary | ICD-10-CM | POA: Diagnosis not present

## 2015-10-10 DIAGNOSIS — R635 Abnormal weight gain: Secondary | ICD-10-CM

## 2015-10-10 LAB — CORTISOL: Cortisol, Plasma: 0.7 ug/dL

## 2015-10-10 LAB — T4, FREE: Free T4: 0.98 ng/dL (ref 0.60–1.60)

## 2015-10-10 LAB — T3, FREE: T3, Free: 2.7 pg/mL (ref 2.3–4.2)

## 2015-10-10 LAB — TSH: TSH: 1.54 u[IU]/mL (ref 0.35–4.50)

## 2015-10-23 ENCOUNTER — Telehealth: Payer: Self-pay | Admitting: Endocrinology

## 2015-10-23 NOTE — Telephone Encounter (Signed)
I contacted the pt. Pt stated she had received her results previously and would like a copy of the blood tests from 10/10/2015 to be mailed. Letter formulated and mailed to the pt.

## 2015-10-23 NOTE — Telephone Encounter (Signed)
PT calling to find out her test results, requests call back

## 2015-11-12 ENCOUNTER — Ambulatory Visit (INDEPENDENT_AMBULATORY_CARE_PROVIDER_SITE_OTHER): Payer: Medicare Other | Admitting: Thoracic Surgery (Cardiothoracic Vascular Surgery)

## 2015-11-12 ENCOUNTER — Encounter: Payer: Self-pay | Admitting: Thoracic Surgery (Cardiothoracic Vascular Surgery)

## 2015-11-12 VITALS — BP 131/80 | HR 75 | Resp 16 | Ht 60.0 in | Wt 129.0 lb

## 2015-11-12 DIAGNOSIS — Z953 Presence of xenogenic heart valve: Secondary | ICD-10-CM | POA: Diagnosis not present

## 2015-11-12 DIAGNOSIS — I340 Nonrheumatic mitral (valve) insufficiency: Secondary | ICD-10-CM | POA: Diagnosis not present

## 2015-11-12 NOTE — Patient Instructions (Signed)

## 2015-11-12 NOTE — Progress Notes (Signed)
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 She was last seen here in our office on 01/15/2015 at which time she was doing well. Since then she has continued to do very well from a cardiac standpoint. She has been followed intermittently by Dr. Percival Spanish and recently underwent a routine follow-up echocardiogram that revealed normal left ventricular systolic function and normal functioning bioprosthetic tissue valve in the mitral position.  She returns to our office for routine follow-up today. She states that she occasionally has very brief transient sharp stabbing pains across her left chest. She also complains that she has gained between 10 and 15 pounds in weight over the past year ever since her surgery. She has developed hypothyroidism. She has problems with hot flashes. She thinks all of this dates back to her surgery. She denies any symptoms of exertional chest pain. She states that she only gets short of breath with strenuous activity and this is not typical. She has no problems with exertional shortness of breath but she does complain of feeling fatigued. She has not had palpitations or dizzy spells.   Current Outpatient Prescriptions  Medication Sig Dispense Refill  . Coenzyme Q10 (CO Q 10) 10 MG CAPS Take by mouth. Reported on 10/08/2015    . dexamethasone (DECADRON) 1 MG tablet Take at 9-10 PM, the night before blood test 1 tablet 0  . loratadine (CLARITIN) 5 MG chewable tablet Chew 5 mg by mouth daily. Reported on 10/08/2015    .  polyethylene glycol powder (GLYCOLAX) powder Take 255 g by mouth daily as needed. 255 g 12  . sodium chloride (OCEAN) 0.65 % nasal spray Place 2 sprays into both nostrils every 4 (four) hours as needed for congestion.     Marland Kitchen SYNTHROID 75 MCG tablet TAKE 1 TABLET (75 MCG TOTAL) BY MOUTH DAILY BEFORE BREAKFAST. 30 tablet 3  . amoxicillin (AMOXIL) 500 MG capsule Take 4 capsules (2,000 mg total) by mouth once. ONE HOUR PRIOR TO DENTAL WORK (Patient not taking: Reported on 10/08/2015) 4 capsule 2  . aspirin 81 MG tablet Take 162 mg by mouth daily. Reported on 11/12/2015    . clotrimazole (GYNE-LOTRIMIN) 1 % vaginal cream Place 1 Applicatorful vaginally at bedtime. (Patient not taking: Reported on 10/08/2015) 45 g 0  . [DISCONTINUED] pantoprazole (PROTONIX) 40 MG tablet Take 1 tablet (40 mg total) by mouth daily. 30 tablet 11   No current facility-administered medications for this visit.      Physical Exam:   BP 131/80 mmHg  Pulse 75  Resp 16  Ht 5' (1.524 m)  Wt 129 lb (58.514 kg)  BMI 25.19 kg/m2  SpO2 97%  General:  Well appearing  Chest:   Clear to auscultation  CV:   Regular rate and rhythm without murmur  Incisions:  Completely healed  Abdomen:  Soft nontender  Extremities:  Warm and well-perfused, no edema  Diagnostic Tests:  Transthoracic Echocardiography  Patient: Lewanda, Biga MR #: [id number] Study Date:  09/11/2015 Gender: F Age: 62 Height: 152.4 cm Weight: 57.2 kg BSA: 1.57 m^2 Pt. Status: Room:  ATTENDING Minus Breeding, MD ORDERING Minus Breeding, MD REFERRING Minus Breeding, MD SONOGRAPHER Marygrace Drought, RCS PERFORMING [facility], Outpatient  cc:  ------------------------------------------------------------------- LV EF: 55% - 60%  ------------------------------------------------------------------- Indications: MVR  (Z95.4).  ------------------------------------------------------------------- History: PMH: Chronic obstructive pulmonary disease. Risk factors: Former tobacco use.  ------------------------------------------------------------------- Study Conclusions  - Left ventricle: The cavity size was normal. Wall thickness was  normal. Systolic function was normal. The estimated ejection  fraction was in the range of 55% to 60%. Wall motion was normal;  there were no regional wall motion abnormalities. Doppler  parameters are consistent with abnormal left ventricular  relaxation (grade 1 diastolic dysfunction). - Mitral valve: A bioprosthesis was present. There was mild  regurgitation. Valve area by continuity equation (using LVOT  flow): 1.67 cm^2.  Impressions:  - Normal LV systolic function; grade 1 diastolic dysfunction; s/p  MVR with mild MR and normal mean gradient; trace TR.  Transthoracic echocardiography. M-mode, complete 2D, spectral Doppler, and color Doppler. Birthdate: Patient birthdate: 23-Dec-1960. Age: Patient is 55 yr old. Sex: Gender: female. BMI: 24.6 kg/m^2. Blood pressure: 104/70 Patient status: Outpatient. Study date: Study date: 09/11/2015. Study time: 10:47 AM. Location: [facility]  -------------------------------------------------------------------  ------------------------------------------------------------------- Left ventricle: The cavity size was normal. Wall thickness was normal. Systolic function was normal. The estimated ejection fraction was in the range of 55% to 60%. Wall motion was normal; there were no regional wall motion abnormalities. Doppler parameters are consistent with abnormal left ventricular relaxation (grade 1 diastolic dysfunction).  ------------------------------------------------------------------- Aortic valve: Trileaflet; mildly thickened leaflets. Mobility was not restricted.  Doppler: Transvalvular velocity was within the normal range. There was no stenosis. There was no regurgitation.  ------------------------------------------------------------------- Aorta: Aortic root: The aortic root was normal in size.  ------------------------------------------------------------------- Mitral valve: A bioprosthesis was present. Mobility was not restricted. Doppler: Transvalvular velocity was within the normal range. There was no evidence for stenosis. There was mild regurgitation. Valve area by pressure half-time: 2.93 cm^2. Indexed valve area by pressure half-time: 1.87 cm^2/m^2. Valve area by continuity equation (using LVOT flow): 1.67 cm^2. Indexed valve area by continuity equation (using LVOT flow): 1.06 cm^2/m^2. Mean gradient (D): 3 mm Hg. Peak gradient (D): 4 mm Hg.  ------------------------------------------------------------------- Left atrium: The atrium was normal in size.  ------------------------------------------------------------------- Right ventricle: The cavity size was normal. Systolic function was normal.  ------------------------------------------------------------------- Pulmonic valve: Doppler: Transvalvular velocity was within the normal range. There was no evidence for stenosis. There was trivial regurgitation.  ------------------------------------------------------------------- Tricuspid valve: Structurally normal valve. Doppler: Transvalvular velocity was within the normal range. There was trivial regurgitation.  ------------------------------------------------------------------- Right atrium: The atrium was normal in size.  ------------------------------------------------------------------- Pericardium: There was no pericardial effusion.  ------------------------------------------------------------------- Systemic veins: Inferior vena cava: The vessel was normal in size. The respirophasic diameter  changes were in the normal range (= 50%), consistent with normal central venous pressure. Diameter: 10 mm.  ------------------------------------------------------------------- Measurements  IVC Value Reference ID 10 mm ---------  Left ventricle Value Reference LV ID, ED, PLAX chordal (L) 36.4 mm 43 - 52 LV ID, ES, PLAX chordal (L) 18.8 mm 23 - 38 LV fx shortening, PLAX chordal 48 % >=29 LV PW thickness, ED 9.89 mm --------- IVS/LV PW ratio, ED 0.89 <=1.3 Stroke volume, 2D 53 ml --------- Stroke volume/bsa, 2D 34 ml/m^2 --------- LV ejection fraction, 1-p A4C 76 % --------- LV e&', lateral 5.48 cm/s --------- LV E/e&', lateral 17.74 ---------  LV e&', medial 7.24 cm/s --------- LV E/e&', medial 13.43 --------- LV e&', average 6.36 cm/s --------- LV E/e&', average 15.28 ---------  Ventricular septum Value Reference IVS thickness, ED 8.76 mm ---------  LVOT Value Reference LVOT ID, S 16 mm --------- LVOT area 2.01 cm^2 --------- LVOT peak velocity, S 111 cm/s --------- LVOT mean velocity, S 76.1 cm/s --------- LVOT VTI, S 26.2 cm  ---------  Aorta Value Reference Aortic root ID, ED 26 mm ---------  Left atrium Value Reference LA ID, A-P, ES 28 mm --------- LA ID/bsa, A-P 1.79 cm/m^2 <=2.2 LA volume, S 39 ml --------- LA volume/bsa, S 24.9 ml/m^2 --------- LA volume, ES, 1-p A4C 26 ml --------- LA volume/bsa, ES, 1-p A4C 16.6 ml/m^2 --------- LA volume, ES, 1-p A2C 49 ml --------- LA volume/bsa, ES, 1-p A2C 31.2 ml/m^2 ---------  Mitral valve Value Reference Mitral E-wave peak velocity 97.2 cm/s --------- Mitral A-wave peak velocity 138 cm/s --------- Mitral mean velocity, D 86.4 cm/s --------- Mitral deceleration time 204 ms 150 - 230 Mitral pressure half-time 64 ms --------- Mitral mean gradient, D 3 mm Hg --------- Mitral peak gradient, D 4 mm Hg --------- Mitral E/A ratio, peak 0.7 --------- Mitral valve area, PHT, DP 2.93 cm^2 --------- Mitral valve area/bsa, PHT, DP 1.87 cm^2/m^2 --------- Mitral valve area, LVOT 1.67 cm^2 --------- continuity Mitral valve area/bsa, LVOT 1.06 cm^2/m^2 --------- continuity Mitral annulus VTI, D 31.6 cm ---------  Right ventricle Value Reference RV s&', lateral, S 8.15 cm/s ---------  Legend: (L) and (H) mark  values outside specified reference range.  ------------------------------------------------------------------- Prepared and Electronically Authenticated by  Kirk Ruths 2017-05-16T13:41:46   Impression:  Patient is doing very well approximately one year status post minimally invasive mitral valve replacement using a bioprosthetic tissue valve. Late follow-up echocardiogram looks good with normal left ventricular function and normal functioning bioprosthetic tissue valve in the mitral position.  Plan:  We have not recommended any changes to the patient's current medications.  The patient has been reminded regarding the importance of dental hygiene and the lifelong need for antibiotic prophylaxis for all dental cleanings and other related invasive procedures.  In the future the patient will call and return to see [place] as needed. All of her questions have been addressed.  I spent in excess of 15 minutes during the conduct of this office consultation and >50% of this time involved direct face-to-face encounter with the patient for counseling and/or coordination of their care.    Valentina Gu. Roxy Manns, MD 11/12/2015 10:50 AM

## 2015-11-13 ENCOUNTER — Encounter: Payer: Self-pay | Admitting: *Deleted

## 2015-11-22 ENCOUNTER — Ambulatory Visit: Payer: Medicare Other | Admitting: Internal Medicine

## 2015-11-22 ENCOUNTER — Other Ambulatory Visit: Payer: Self-pay | Admitting: Internal Medicine

## 2015-11-22 ENCOUNTER — Encounter: Payer: Self-pay | Admitting: Internal Medicine

## 2015-11-23 ENCOUNTER — Ambulatory Visit (INDEPENDENT_AMBULATORY_CARE_PROVIDER_SITE_OTHER): Payer: Medicare Other | Admitting: Family

## 2015-11-23 ENCOUNTER — Encounter: Payer: Self-pay | Admitting: Family

## 2015-11-23 DIAGNOSIS — J0140 Acute pansinusitis, unspecified: Secondary | ICD-10-CM

## 2015-11-23 DIAGNOSIS — Z0001 Encounter for general adult medical examination with abnormal findings: Secondary | ICD-10-CM | POA: Insufficient documentation

## 2015-11-23 DIAGNOSIS — J019 Acute sinusitis, unspecified: Secondary | ICD-10-CM | POA: Insufficient documentation

## 2015-11-23 DIAGNOSIS — Z0000 Encounter for general adult medical examination without abnormal findings: Secondary | ICD-10-CM | POA: Diagnosis not present

## 2015-11-23 MED ORDER — AMOXICILLIN-POT CLAVULANATE 875-125 MG PO TABS: 1.0000 | ORAL_TABLET | Freq: Two times a day (BID) | ORAL | 0 refills | Status: DC

## 2015-11-23 NOTE — Patient Instructions (Signed)
Thank you for choosing [facility].  Summary/Instructions:  Your prescription(s) have been submitted to your pharmacy or been printed and provided for you. Please take as directed and contact our office if you believe you are having problem(s) with the medication(s) or have any questions.  If your symptoms worsen or fail to improve, please contact our office for further instruction, or in case of emergency go directly to the emergency room at the closest medical facility.   General Recommendations:    Please drink plenty of fluids.  Get plenty of rest   Sleep in humidified air  Use saline nasal sprays  Netti pot   OTC Medications:  Decongestants - helps relieve congestion   Flonase (generic fluticasone) or Nasacort (generic triamcinolone) - please make sure to use the "cross-over" technique at a 45 degree angle towards the opposite eye as opposed to straight up the nasal passageway.   Sudafed (generic pseudoephedrine - Note this is the one that is available behind the pharmacy counter); Products with phenylephrine (-PE) may also be used but is often not as effective as pseudoephedrine.   If you have HIGH BLOOD PRESSURE - Coricidin HBP; AVOID any product that is -D as this contains pseudoephedrine which may increase your blood pressure.  Afrin (oxymetazoline) every 6-8 hours for up to 3 days.   Allergies - helps relieve runny nose, itchy eyes and sneezing   Claritin (generic loratidine), Allegra (fexofenidine), or Zyrtec (generic cyrterizine) for runny nose. These medications should not cause drowsiness.  Note - Benadryl (generic diphenhydramine) may be used however may cause drowsiness  Cough -   Delsym or Robitussin (generic dextromethorphan)  Expectorants - helps loosen mucus to ease removal   Mucinex (generic guaifenesin) as directed on the package.  Headaches / General Aches   Tylenol (generic acetaminophen) - DO NOT EXCEED 3 grams (3,000 mg) in a 24  hour time period  Advil/Motrin (generic ibuprofen)   Sore Throat -   Salt water gargle   Chloraseptic (generic benzocaine) spray or lozenges / Sucrets (generic dyclonine)    Sinusitis Sinusitis is redness, soreness, and inflammation of the paranasal sinuses. Paranasal sinuses are air pockets within the bones of your face (beneath the eyes, the middle of the forehead, or above the eyes). In healthy paranasal sinuses, mucus is able to drain out, and air is able to circulate through them by way of your nose. However, when your paranasal sinuses are inflamed, mucus and air can become trapped. This can allow bacteria and other germs to grow and cause infection. Sinusitis can develop quickly and last only a short time (acute) or continue over a long period (chronic). Sinusitis that lasts for more than 12 weeks is considered chronic.  CAUSES  Causes of sinusitis include:  Allergies.  Structural abnormalities, such as displacement of the cartilage that separates your nostrils (deviated septum), which can decrease the air flow through your nose and sinuses and affect sinus drainage.  Functional abnormalities, such as when the small hairs (cilia) that line your sinuses and help remove mucus do not work properly or are not present. SIGNS AND SYMPTOMS  Symptoms of acute and chronic sinusitis are the same. The primary symptoms are pain and pressure around the affected sinuses. Other symptoms include:  Upper toothache.  Earache.  Headache.  Bad breath.  Decreased sense of smell and taste.  A cough, which worsens when you are lying flat.  Fatigue.  Fever.  Thick drainage from your nose, which often is green and may   contain pus (purulent).  Swelling and warmth over the affected sinuses. DIAGNOSIS  Your health care provider will perform a physical exam. During the exam, your health care provider may:  Look in your nose for signs of abnormal growths in your nostrils (nasal  polyps).  Tap over the affected sinus to check for signs of infection.  View the inside of your sinuses (endoscopy) using an imaging device that has a light attached (endoscope). If your health care provider suspects that you have chronic sinusitis, one or more of the following tests may be recommended:  Allergy tests.  Nasal culture. A sample of mucus is taken from your nose, sent to a lab, and screened for bacteria.  Nasal cytology. A sample of mucus is taken from your nose and examined by your health care provider to determine if your sinusitis is related to an allergy. TREATMENT  Most cases of acute sinusitis are related to a viral infection and will resolve on their own within 10 days. Sometimes medicines are prescribed to help relieve symptoms (pain medicine, decongestants, nasal steroid sprays, or saline sprays).  However, for sinusitis related to a bacterial infection, your health care provider will prescribe antibiotic medicines. These are medicines that will help kill the bacteria causing the infection.  Rarely, sinusitis is caused by a fungal infection. In theses cases, your health care provider will prescribe antifungal medicine. For some cases of chronic sinusitis, surgery is needed. Generally, these are cases in which sinusitis recurs more than 3 times per year, despite other treatments. HOME CARE INSTRUCTIONS   Drink plenty of water. Water helps thin the mucus so your sinuses can drain more easily.  Use a humidifier.  Inhale steam 3 to 4 times a day (for example, sit in the bathroom with the shower running).  Apply a warm, moist washcloth to your face 3 to 4 times a day, or as directed by your health care provider.  Use saline nasal sprays to help moisten and clean your sinuses.  Take medicines only as directed by your health care provider.  If you were prescribed either an antibiotic or antifungal medicine, finish it all even if you start to feel better. SEEK IMMEDIATE  MEDICAL CARE IF:  You have increasing pain or severe headaches.  You have nausea, vomiting, or drowsiness.  You have swelling around your face.  You have vision problems.  You have a stiff neck.  You have difficulty breathing. MAKE SURE YOU:   Understand these instructions.  Will watch your condition.  Will get help right away if you are not doing well or get worse. Document Released: 04/14/2005 Document Revised: 08/29/2013 Document Reviewed: 04/29/2011 Exit[employer] Patient Information 2015 [employer]. This information is not intended to replace advice given to you by your health care provider. Make sure you discuss any questions you have with your health care provider.   

## 2015-11-23 NOTE — Assessment & Plan Note (Signed)
Symptoms and exam consistent with acute pansinusitis. Start Augmentin. Continue over-the-counter medications as needed for symptom relief and supportive care. Follow-up if symptoms worsen or do not improve.

## 2015-11-23 NOTE — Progress Notes (Signed)
Subjective:    Patient ID: Elaine Le, female    DOB: 11/18/1960, 56 y.o.   MRN: [id number]  Chief Complaint  Patient presents with  . Nasal Congestion    having nasal congestion and pressure, off balance     HPI:  Elaine Le is a 55 y.o. female who  has a past medical history of Acute respiratory failure with hypercapnia ([place]) (06/21/2014); Acute systolic heart failure ([place]) (09/2014); Adenomatous colon polyp; Arthritis; Back pain; Chronic diastolic heart failure ([place]); Chronic pain syndrome; Chronic sinusitis; CN (constipation) (05/25/2014); Community acquired pneumonia; COPD (chronic obstructive pulmonary disease)-PFT pending  (07/17/2014); Diverticulosis; Fatigue; Fibromyalgia; GERD (gastroesophageal reflux disease); Headaches, cluster; Hiatal hernia; adenomatous colonic polyps (03/10/2011); IBS (irritable bowel syndrome) (01/21/2011); Influenza A (06/29/2014); Internal hemorrhoids; Irritable bowel syndrome (IBS); Mitral valve regurgitation; Mitral valve stenosis, moderate (07/11/2014); Muscle pain; Muscular deconditioning (07/05/2014); Night sweats; Pleural effusion; Pneumonia; Protein-calorie malnutrition, severe ([place]) (07/06/2014); 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 and presents today For an acute office visit.  This is a new problem. Associated symptom of nasal congestion, sinus pressure, and feeling off balance have been going on for about a month. Denies fevers. No modifying factors/treatments that make it better. Describes that she does have a little trouble with her focus. Does have a mild sore throat. No recent antibiotics. Symptoms have generally stayed about the same since initial onset.   Allergies  Allergen Reactions  . Morphine And Related Other (See Comments)    Altered mental state  .  Other Other (See Comments)    Any antidepressants per pt- causes altered mental state, anger  . Warfarin And Related      Current Outpatient Prescriptions on File Prior to Visit  Medication Sig Dispense Refill  . aspirin 81 MG tablet Take 162 mg by mouth daily. Reported on 11/12/2015    . clotrimazole (GYNE-LOTRIMIN) 1 % vaginal cream Place 1 Applicatorful vaginally at bedtime. 45 g 0  . Coenzyme Q10 (CO Q 10) 10 MG CAPS Take by mouth. Reported on 10/08/2015    . dexamethasone (DECADRON) 1 MG tablet Take at 9-10 PM, the night before blood test 1 tablet 0  . loratadine (CLARITIN) 5 MG chewable tablet Chew 5 mg by mouth daily. Reported on 10/08/2015    . polyethylene glycol powder (GLYCOLAX) powder Take 255 g by mouth daily as needed. 255 g 12  . sodium chloride (OCEAN) 0.65 % nasal spray Place 2 sprays into both nostrils every 4 (four) hours as needed for congestion.     Marland Kitchen SYNTHROID 75 MCG tablet TAKE 1 TABLET (75 MCG TOTAL) BY MOUTH DAILY BEFORE BREAKFAST. 90 tablet 1  . [DISCONTINUED] pantoprazole (PROTONIX) 40 MG tablet Take 1 tablet (40 mg total) by mouth daily. 30 tablet 11   No current facility-administered medications on file prior to visit.      Past Surgical History:  Procedure Laterality Date  . CYSTOSTOMY W/ BLADDER DILATION     at age 20  . LEFT AND RIGHT HEART CATHETERIZATION WITH CORONARY ANGIOGRAM N/A 07/27/2014   Procedure: LEFT AND RIGHT HEART CATHETERIZATION WITH CORONARY ANGIOGRAM;  Surgeon: Burnell Blanks, MD;  Location[facility] CATH LAB;  Right left heart catheterization: Mild/minimal coronary artery disease. RV: 50/7/17, PA: 48/29 (mean 38), PCWP 33 mmHg  . MITRAL VALVE REPAIR Right 11/08/2014   Procedure: MINIMALLY INVASIVE MITRAL VALVE REPLACEMENT(MVR);  Surgeon: Braulio Conte  Keturah Barre, MD;  Location: [facility];  Service: Open Heart Surgery;  Laterality: Right;  . NEUROPLASTY / TRANSPOSITION MEDIAN NERVE AT CARPAL TUNNEL BILATERAL    . TEE WITHOUT CARDIOVERSION N/A 07/10/2014     Procedure: TRANSESOPHAGEAL ECHOCARDIOGRAM (TEE);  Surgeon: Lelon Perla, MD;  Location: [facility] ENDOSCOPY;  Service: Cardiovascular;  55-60%. No regional wall motion modalities. Moderate mitral stenosis with severe regurgitation and moderate to severely dilated left atrium.  . TEE WITHOUT CARDIOVERSION N/A 11/08/2014   Procedure: TRANSESOPHAGEAL ECHOCARDIOGRAM (TEE);  Surgeon: Rexene Alberts, MD;  Location: [facility];  Service: Open Heart Surgery;  Laterality: N/A;  . THORACIC OUTLET SURGERY    . TRANSTHORACIC ECHOCARDIOGRAM  07/04/2014   Normal LV Size/Fnx - EF 60-65% no RWMA; MV: thickened leaflets with doming, fixed Posterior leaflet, anterior leaflet w/ large/shaggy & mobile density.  c/w Rheumatic Mitral disease - moderate MS & Mod-Severe MR.  . TUBAL LIGATION    . VULVA SURGERY       Review of Systems  Constitutional: Negative for chills and fever.  HENT: Positive for congestion, ear pain and sinus pressure.   Neurological: Positive for headaches.      Objective:    BP 124/84 (BP Location: Left Arm, Patient Position: Sitting, Cuff Size: Normal)   Pulse 76   Temp 97.9 F (36.6 C) (Oral)   Resp 16   Ht 5' (1.524 m)   Wt 124 lb (56.2 kg)   SpO2 99%   BMI 24.22 kg/m  Nursing note and vital signs reviewed.  Physical Exam  Constitutional: She is oriented to person, place, and time. She appears well-developed and well-nourished. No distress.  HENT:  Right Ear: Hearing, tympanic membrane, external ear and ear canal normal.  Left Ear: Hearing, tympanic membrane, external ear and ear canal normal.  Nose: Right sinus exhibits maxillary sinus tenderness and frontal sinus tenderness. Left sinus exhibits maxillary sinus tenderness and frontal sinus tenderness.  Mouth/Throat: Uvula is midline, oropharynx is clear and moist and mucous membranes are normal.  Neck: Neck supple.  Cardiovascular: Normal rate, regular rhythm, normal heart sounds and intact distal pulses.   Pulmonary/Chest:  Effort normal and breath sounds normal.  Neurological: She is alert and oriented to person, place, and time.  Skin: Skin is warm and dry.  Psychiatric: She has a normal mood and affect. Her behavior is normal. Judgment and thought content normal.       Assessment & Plan:   Problem List Items Addressed This Visit      Respiratory   Sinusitis, acute    Symptoms and exam consistent with acute pansinusitis. Start Augmentin. Continue over-the-counter medications as needed for symptom relief and supportive care. Follow-up if symptoms worsen or do not improve.      Relevant Medications   amoxicillin-clavulanate (AUGMENTIN) 875-125 MG tablet     Other   Routine general medical examination at a health care facility   Relevant Orders   Ambulatory referral to Dermatology    Other Visit Diagnoses   None.      I have discontinued Elaine Le's amoxicillin and levothyroxine. I am also having her start on amoxicillin-clavulanate. Additionally, I am having her maintain her sodium chloride, clotrimazole, Co Q 10, loratadine, aspirin, polyethylene glycol powder, dexamethasone, and SYNTHROID.   Meds ordered this encounter  Medications  . amoxicillin-clavulanate (AUGMENTIN) 875-125 MG tablet    Sig: Take 1 tablet by mouth 2 (two) times daily.    Dispense:  20 tablet    Refill:  0    Order Specific Question:   Supervising Provider    Answer:   Pricilla Holm A [[id number]]     Follow-up: Return if symptoms worsen or fail to improve.  Mauricio Po, FNP

## 2015-11-29 ENCOUNTER — Telehealth: Payer: Self-pay | Admitting: Family

## 2015-11-29 DIAGNOSIS — B379 Candidiasis, unspecified: Secondary | ICD-10-CM

## 2015-11-29 MED ORDER — TERCONAZOLE 0.4 % VA CREA: 1.0000 | TOPICAL_CREAM | Freq: Every day | VAGINAL | 0 refills | Status: DC

## 2015-11-29 NOTE — Telephone Encounter (Signed)
Pt saw Marya Amsler on 11/23/15 and given antibiotic for 2 weeks to take, she was wondering if Marya Amsler can send in Terasol 0.08 cream for yeast infection. [facility]

## 2015-11-29 NOTE — Telephone Encounter (Signed)
Pt aware.

## 2015-11-29 NOTE — Telephone Encounter (Signed)
Medication sent.

## 2015-11-30 ENCOUNTER — Telehealth: Payer: Self-pay | Admitting: Emergency Medicine

## 2015-11-30 NOTE — Telephone Encounter (Signed)
[facility] called and she was prescribed terconazole (TERAZOL 7) 0.4 % vaginal cream. She asked to have Terazol 3 instead. Please follow up thanks. [facility]acy is  [facility] [address].

## 2015-12-03 MED ORDER — TERCONAZOLE 0.8 % VA CREA: 1.0000 | TOPICAL_CREAM | Freq: Every day | VAGINAL | 0 refills | Status: DC

## 2015-12-03 NOTE — Telephone Encounter (Signed)
terconazole (TERAZOL 7) 0.4 % vaginal cream  Patient called again about this. Says it should be a 0.8% Please follow up, Thank you.

## 2015-12-03 NOTE — Telephone Encounter (Signed)
Updated rx has been sent

## 2016-01-29 ENCOUNTER — Telehealth: Payer: Self-pay | Admitting: Emergency Medicine

## 2016-01-29 ENCOUNTER — Encounter: Payer: Self-pay | Admitting: Nurse Practitioner

## 2016-01-29 ENCOUNTER — Ambulatory Visit (INDEPENDENT_AMBULATORY_CARE_PROVIDER_SITE_OTHER): Payer: Medicare Other | Admitting: Nurse Practitioner

## 2016-01-29 VITALS — BP 128/78 | HR 88 | Temp 98.3°F | Ht 60.0 in | Wt 121.0 lb

## 2016-01-29 DIAGNOSIS — J0140 Acute pansinusitis, unspecified: Secondary | ICD-10-CM | POA: Diagnosis not present

## 2016-01-29 DIAGNOSIS — J309 Allergic rhinitis, unspecified: Secondary | ICD-10-CM

## 2016-01-29 MED ORDER — GUAIFENESIN ER 600 MG PO TB12: 600.0000 mg | ORAL_TABLET | Freq: Two times a day (BID) | ORAL | 0 refills | Status: DC | PRN

## 2016-01-29 MED ORDER — AZITHROMYCIN 250 MG PO TABS: 250.0000 mg | ORAL_TABLET | Freq: Every day | ORAL | 0 refills | Status: DC

## 2016-01-29 MED ORDER — FLUTICASONE PROPIONATE 50 MCG/ACT NA SUSP: 2.0000 | Freq: Every day | NASAL | 0 refills | Status: DC

## 2016-01-29 MED ORDER — MOMETASONE FUROATE 50 MCG/ACT NA SUSP: 2.0000 | Freq: Every day | NASAL | 0 refills | Status: DC

## 2016-01-29 MED ORDER — OXYMETAZOLINE HCL 0.05 % NA SOLN: 1.0000 | Freq: Two times a day (BID) | NASAL | 0 refills | Status: DC

## 2016-01-29 NOTE — Patient Instructions (Addendum)
URI Instructions: Nasonex and Afrin use: apply 1spray of afrin in each nare, wait 59mins, then apply 2sprays of flonase in each nare. Use both nasal spray consecutively x 3days, then flonase only for at least 14days.  Use over-the-counter  "cold" medicines  such as "Tylenol cold" , "Advil cold",  "Mucinex" or" Mucinex D"  for cough and congestion.  Avoid decongestants if you have high blood pressure. Use" Delsym" or" Robitussin" cough syrup varietis for cough.  You can use plain "Tylenol" or "Advi"l for fever, chills and achyness.

## 2016-01-29 NOTE — Progress Notes (Signed)
Subjective:  Patient ID: Elaine Le, female    DOB: 12/25/1960  Age: 55 y.o. MRN: [id number]  CC: Sinus Problem (Pt stated having sinus, face, teeth are painful and having hard time breathing.)  Sinus Problem  This is a recurrent problem. The current episode started more than 1 month ago. The problem has been waxing and waning since onset. There has been no fever. Her pain is at a severity of 6/10. The pain is moderate. Associated symptoms include congestion, headaches, sinus pressure and sneezing. Pertinent negatives include no chills, sore throat or swollen glands. Past treatments include saline sprays and antibiotics. The treatment provided mild relief.    Outpatient Medications Prior to Visit  Medication Sig Dispense Refill  . aspirin 81 MG tablet Take 162 mg by mouth daily. Reported on 11/12/2015    . Coenzyme Q10 (CO Q 10) 10 MG CAPS Take by mouth. Reported on 10/08/2015    . loratadine (CLARITIN) 5 MG chewable tablet Chew 5 mg by mouth daily. Reported on 10/08/2015    . polyethylene glycol powder (GLYCOLAX) powder Take 255 g by mouth daily as needed. 255 g 12  . sodium chloride (OCEAN) 0.65 % nasal spray Place 2 sprays into both nostrils every 4 (four) hours as needed for congestion.     Marland Kitchen SYNTHROID 75 MCG tablet TAKE 1 TABLET (75 MCG TOTAL) BY MOUTH DAILY BEFORE BREAKFAST. 90 tablet 1  . terconazole (TERAZOL 3) 0.8 % vaginal cream Place 1 applicator vaginally at bedtime. 20 g 0  . clotrimazole (GYNE-LOTRIMIN) 1 % vaginal cream Place 1 Applicatorful vaginally at bedtime. 45 g 0  . dexamethasone (DECADRON) 1 MG tablet Take at 9-10 PM, the night before blood test (Patient not taking: Reported on 01/29/2016) 1 tablet 0  . amoxicillin-clavulanate (AUGMENTIN) 875-125 MG tablet Take 1 tablet by mouth 2 (two) times daily. (Patient not taking: Reported on 01/29/2016) 20 tablet 0   No facility-administered medications prior to visit.     ROS See HPI  Objective:  BP 128/78 (BP  Location: Left Arm, Patient Position: Sitting, Cuff Size: Normal)   Pulse 88   Temp 98.3 F (36.8 C)   Ht 5' (1.524 m)   Wt 121 lb (54.9 kg)   SpO2 98%   BMI 23.63 kg/m   BP Readings from Last 3 Encounters:  01/29/16 128/78  11/23/15 124/84  11/12/15 131/80    Wt Readings from Last 3 Encounters:  01/29/16 121 lb (54.9 kg)  11/23/15 124 lb (56.2 kg)  11/12/15 129 lb (58.5 kg)    Physical Exam  Constitutional: She is oriented to person, place, and time. No distress.  HENT:  Nose: Mucosal edema and rhinorrhea present. Right sinus exhibits maxillary sinus tenderness and frontal sinus tenderness. Left sinus exhibits maxillary sinus tenderness and frontal sinus tenderness.  Mouth/Throat: Posterior oropharyngeal erythema present. No oropharyngeal exudate.  Eyes: No scleral icterus.  Neck: Normal range of motion.  Cardiovascular: Normal rate, regular rhythm and normal heart sounds.   Pulmonary/Chest: Effort normal and breath sounds normal.  Lymphadenopathy:    She has no cervical adenopathy.  Neurological: She is alert and oriented to person, place, and time.  Skin: Skin is warm and dry.  Vitals reviewed.   Lab Results  Component Value Date   WBC 7.9 02/01/2015   HGB 15.1 (H) 02/01/2015   HCT 45.3 02/01/2015   PLT 292.0 02/01/2015   GLUCOSE 89 05/03/2015   TRIG 115 06/28/2014   ALT 28 05/03/2015   AST  22 05/03/2015   NA 139 05/03/2015   K 4.8 05/03/2015   CL 104 05/03/2015   CREATININE 0.70 05/03/2015   BUN 9 05/03/2015   CO2 30 05/03/2015   TSH 1.54 10/10/2015   INR 1.6 01/15/2015   HGBA1C 6.3 (H) 11/06/2014    No results found.  Assessment & Plan:   Yamilett was seen today for sinus problem.  Diagnoses and all orders for this visit:  Acute pansinusitis, recurrence not specified -     azithromycin (ZITHROMAX Z-PAK) 250 MG tablet; Take 1 tablet (250 mg total) by mouth daily. Take 2tabs on first day, then 1tab once a day till complete -     guaiFENesin  (MUCINEX) 600 MG 12 hr tablet; Take 1 tablet (600 mg total) by mouth 2 (two) times daily as needed for cough or to loosen phlegm. -     Discontinue: fluticasone (FLONASE) 50 MCG/ACT nasal spray; Place 2 sprays into both nostrils daily. -     oxymetazoline (AFRIN NASAL SPRAY) 0.05 % nasal spray; Place 1 spray into both nostrils 2 (two) times daily. Use only for 3days, then stop -     mometasone (NASONEX) 50 MCG/ACT nasal spray; Place 2 sprays into the nose daily.  Chronic allergic rhinitis, unspecified seasonality, unspecified trigger -     mometasone (NASONEX) 50 MCG/ACT nasal spray; Place 2 sprays into the nose daily.   I have discontinued Ms. George's clotrimazole, amoxicillin-clavulanate, and fluticasone. I am also having her start on azithromycin, guaiFENesin, oxymetazoline, and mometasone. Additionally, I am having her maintain her sodium chloride, Co Q 10, loratadine, aspirin, polyethylene glycol powder, dexamethasone, SYNTHROID, terconazole, furosemide, and Vitamin D (Ergocalciferol).  Meds ordered this encounter  Medications  . furosemide (LASIX) 40 MG tablet  . Vitamin D, Ergocalciferol, (DRISDOL) 50000 units CAPS capsule    Sig: TAKE 1 CAPSULE BY MOUTH TWICE WEEKLY AS DIRECTED    Refill:  2  . azithromycin (ZITHROMAX Z-PAK) 250 MG tablet    Sig: Take 1 tablet (250 mg total) by mouth daily. Take 2tabs on first day, then 1tab once a day till complete    Dispense:  6 tablet    Refill:  0    Order Specific Question:   Supervising Provider    Answer:   Cassandria Anger [[id number]]  . guaiFENesin (MUCINEX) 600 MG 12 hr tablet    Sig: Take 1 tablet (600 mg total) by mouth 2 (two) times daily as needed for cough or to loosen phlegm.    Dispense:  14 tablet    Refill:  0    Order Specific Question:   Supervising Provider    Answer:   Cassandria Anger [[id number]]  . DISCONTD: fluticasone (FLONASE) 50 MCG/ACT nasal spray    Sig: Place 2 sprays into both nostrils daily.    Dispense:  16  g    Refill:  0    Order Specific Question:   Supervising Provider    Answer:   Cassandria Anger [[id number]]  . oxymetazoline (AFRIN NASAL SPRAY) 0.05 % nasal spray    Sig: Place 1 spray into both nostrils 2 (two) times daily. Use only for 3days, then stop    Dispense:  30 mL    Refill:  0    Order Specific Question:   Supervising Provider    Answer:   Cassandria Anger [[id number]]  . mometasone (NASONEX) 50 MCG/ACT nasal spray    Sig: Place 2 sprays into the nose daily.  Dispense:  17 g    Refill:  0    Order Specific Question:   Supervising Provider    Answer:   Cassandria Anger [[id number]]    Follow-up: Return if symptoms worsen or fail to improve.  Wilfred Lacy, NP

## 2016-01-29 NOTE — Telephone Encounter (Signed)
Pt called and has questions about the prescription azithromycin (ZITHROMAX Z-PAK) 250 MG tablet. She states the side effects states it could make your heart race. She has a mitrovalve and doesn't know if she should take these medication. Please advise and give her a call thanks.

## 2016-01-29 NOTE — Progress Notes (Signed)
Pre visit review using our clinic review tool, if applicable. No additional management support is needed unless otherwise documented below in the visit note. 

## 2016-01-30 NOTE — Telephone Encounter (Signed)
Patient also is requesting something to be sent in for yeast infection as well.

## 2016-01-30 NOTE — Telephone Encounter (Signed)
Azithromycin is only contraindicated if there is a history of ventricular arrhythmia. She does not have a history of prolonged QT or ventricular arrhythmia, therefore she should be okay taking  Azithromycin. Thank you

## 2016-01-31 ENCOUNTER — Other Ambulatory Visit: Payer: Self-pay

## 2016-01-31 MED ORDER — FLUCONAZOLE 150 MG PO TABS: 150.0000 mg | ORAL_TABLET | Freq: Once | ORAL | 0 refills | Status: AC

## 2016-01-31 NOTE — Telephone Encounter (Signed)
I am not familiar with that cream.  Is she talking about terconazole?

## 2016-01-31 NOTE — Telephone Encounter (Signed)
I already sent diflucan rx to pharm per [place]----but patient states she doesn't like diflucan---she is requesting terrozen cream---please advise, thANKS

## 2016-01-31 NOTE — Progress Notes (Unsigned)
Per [place], ok to send 1 pill---patient needs to take after she finishes zpak---I will call patient

## 2016-02-01 MED ORDER — TERCONAZOLE 0.8 % VA CREA: 1.0000 | TOPICAL_CREAM | Freq: Every day | VAGINAL | 0 refills | Status: DC

## 2016-02-01 NOTE — Addendum Note (Signed)
Addended by: Wilfred Lacy L on: 02/01/2016 08:25 AM   Modules accepted: Orders

## 2016-02-01 NOTE — Telephone Encounter (Signed)
Prescription sent

## 2016-02-01 NOTE — Telephone Encounter (Signed)
Patient advised [facility] [address] [facility]

## 2016-02-01 NOTE — Telephone Encounter (Signed)
Yes, patient thinks that's the name

## 2016-02-03 ENCOUNTER — Other Ambulatory Visit: Payer: Self-pay | Admitting: Cardiology

## 2016-02-04 ENCOUNTER — Ambulatory Visit: Payer: Medicare Other | Admitting: Pulmonary Disease

## 2016-03-10 ENCOUNTER — Ambulatory Visit: Payer: Medicare Other | Admitting: Pulmonary Disease

## 2016-04-02 ENCOUNTER — Ambulatory Visit (INDEPENDENT_AMBULATORY_CARE_PROVIDER_SITE_OTHER): Payer: Medicare Other | Admitting: Pulmonary Disease

## 2016-04-02 ENCOUNTER — Encounter: Payer: Self-pay | Admitting: Pulmonary Disease

## 2016-04-02 ENCOUNTER — Telehealth: Payer: Self-pay | Admitting: Pulmonary Disease

## 2016-04-02 DIAGNOSIS — J432 Centrilobular emphysema: Secondary | ICD-10-CM | POA: Diagnosis not present

## 2016-04-02 DIAGNOSIS — J339 Nasal polyp, unspecified: Secondary | ICD-10-CM

## 2016-04-02 MED ORDER — MONTELUKAST SODIUM 10 MG PO TABS: 10.0000 mg | ORAL_TABLET | Freq: Every day | ORAL | 0 refills | Status: DC

## 2016-04-02 NOTE — Assessment & Plan Note (Signed)
She has polyps associated with year-round allergic rhinitis. She's not done well with nasal steroids in the past. She currently takes Claritin. I think the addition of Singulair would help. I counseled her to use saline rinses and avoid using decongestants considering her valvular disease.  This has been worse recently.  Plan: Start Singulair 10 mg daily

## 2016-04-02 NOTE — Assessment & Plan Note (Signed)
This has been a stable interval for [employer]. She has centrilobular emphysema secondary to prior tobacco use but she has not had an exacerbation of this in the last year. Given the fact that she has remained absent from tobacco, I anticipate that any decline in lung function will be only what is anticipated with aging. She starting from a good place in that she has minimal airflow obstruction right now. As stated previously, there is no need for a long-acting bronchodilator given her lack of symptoms.  Plan: I counseled her today to never start smoking cigarettes again Flu shot offered, she refused once again We talked about the importance of hand hygiene Remain active Follow-up with [place] if she develops worsening symptoms.

## 2016-04-02 NOTE — Progress Notes (Signed)
Subjective:    Patient ID: Elaine Le, female    DOB: 05/31/1960, 55 y.o.   MRN: [id number]  Synopsis: Elaine Le was hospitalized for a severe exacerbation of COPD with pneumonia in early 2016. She established care with the [place] [facility] afterwards. 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  HPI Chief Complaint  Patient presents with  . Follow-up    1 year follow up. SOB at times of moving around. No chest pain or tightness.    Elaine Le says that she had an episode of dyspnea a few days ago when she had to run quickly after a few dogs.  However she says active and even mows lawns regularly and doesn't typically have problems with that in terms of breathing.  She has lost weight with all this activity.  No chest congestion, no significant mucus production.  She had a recent cough related to post nasal drip.  She doesn't take any decongestants anymore.  She takes claritin only as needed.  She doesn't like nasal steroids because she would taste it.    Past Medical History:  Diagnosis Date  . Acute respiratory failure with hypercapnia ([place]) 06/21/2014  . Acute systolic heart failure ([place]) 09/2014  . Adenomatous colon polyp   . Arthritis   . Back pain   . Chronic diastolic heart failure ([facility])    related to MR/MS  . Chronic pain syndrome   . Chronic sinusitis   . CN (constipation) 05/25/2014  . Community acquired pneumonia   . COPD (chronic obstructive pulmonary disease)-PFT pending  07/17/2014  . Diverticulosis   . Fatigue   . Fibromyalgia   . GERD (gastroesophageal reflux disease)   . Headaches, cluster   . Hiatal hernia   . Hx of adenomatous colonic polyps 03/10/2011   Oct 2012, repeat colon Oct 2017   . IBS (irritable bowel syndrome)  01/21/2011  . Influenza A 06/29/2014  . Internal hemorrhoids   . Irritable bowel syndrome (IBS)   . Mitral valve regurgitation    severe  . Mitral valve stenosis, moderate 07/11/2014  . Muscle pain   . Muscular deconditioning 07/05/2014  . Night sweats   . Pleural effusion   . Pneumonia   . Protein-calorie malnutrition, severe ([place]) 07/06/2014  . 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 S/P minimally invasive mitral valve replacement with bioprosthetic valve 11/08/2014   25 mm [facility] Mitral bovine bioprosthetic tissue valve placed via right mini thoracotomy approach  . Severe mitral regurgitation by prior echocardiogram 07/11/2014  . Tobacco abuse      Review of Systems  Constitutional: Positive for fatigue. Negative for chills and fever.  HENT: Positive for postnasal drip, rhinorrhea and sinus pressure.   Respiratory: Negative for cough, shortness of breath and wheezing.   Cardiovascular: Negative for chest pain and leg swelling.       Objective:   Physical Exam Vitals:   04/02/16 1058  BP: 116/74  Pulse: 64  SpO2: 97%  Weight: 120 lb (54.4 kg)  Height: 5' (1.524 m)  RA  Gen: well appearing HENT: OP clear, TM's clear, neck supple PULM: CTA B, normal percussion CV: RRR, no mgr, trace edema GI: BS+, soft, nontender Derm: no cyanosis or rash Psyche: normal  mood and affect  Records reviewed from her July 2017 visit with cardiothoracic surgery, she was doing well post bioprosthetic valve treatment of her endocarditis.  CBC    Component Value Date/Time   WBC 7.9 02/01/2015 1037   RBC 5.27 (H) 02/01/2015 1037   HGB 15.1 (H) 02/01/2015 1037   HCT 45.3 02/01/2015 1037   PLT 292.0 02/01/2015 1037   MCV 86.1 02/01/2015 1037   MCH 28.6 11/14/2014 0445   MCHC 33.4 02/01/2015 1037   RDW 15.0 02/01/2015 1037   LYMPHSABS 2.0 02/01/2015 1037   MONOABS 0.5 02/01/2015 1037   EOSABS 0.1 02/01/2015 1037    BASOSABS 0.1 02/01/2015 1037        Assessment & Plan:  COPD (chronic obstructive pulmonary disease)-mild This has been a stable interval for Elaine Le. She has centrilobular emphysema secondary to prior tobacco use but she has not had an exacerbation of this in the last year. Given the fact that she has remained absent from tobacco, I anticipate that any decline in lung function will be only what is anticipated with aging. She starting from a good place in that she has minimal airflow obstruction right now. As stated previously, there is no need for a long-acting bronchodilator given her lack of symptoms.  Plan: I counseled her today to never start smoking cigarettes again Flu shot offered, she refused once again We talked about the importance of hand hygiene Remain active Follow-up with [place] if she develops worsening symptoms.  Nasal polyps She has polyps associated with year-round allergic rhinitis. She's not done well with nasal steroids in the past. She currently takes Claritin. I think the addition of Singulair would help. I counseled her to use saline rinses and avoid using decongestants considering her valvular disease.  This has been worse recently.  Plan: Start Singulair 10 mg daily    Current Outpatient Prescriptions:  .  aspirin 81 MG tablet, Take 162 mg by mouth daily. Reported on 11/12/2015, Disp: , Rfl:  .  Coenzyme Q10 (CO Q 10) 10 MG CAPS, Take by mouth. Reported on 10/08/2015, Disp: , Rfl:  .  furosemide (LASIX) 40 MG tablet, TAKE 1 TABLET BY MOUTH ONCE DAILY (Patient taking differently: As needed for swelling), Disp: 30 tablet, Rfl: 1 .  loratadine (CLARITIN) 5 MG chewable tablet, Chew 5 mg by mouth as needed. Reported on 10/08/2015, Disp: , Rfl:  .  oxymetazoline (AFRIN NASAL SPRAY) 0.05 % nasal spray, Place 1 spray into both nostrils 2 (two) times daily. Use only for 3days, then stop, Disp: 30 mL, Rfl: 0 .  polyethylene glycol powder (GLYCOLAX) powder, Take 255 g by mouth  daily as needed., Disp: 255 g, Rfl: 12 .  sodium chloride (OCEAN) 0.65 % nasal spray, Place 2 sprays into both nostrils every 4 (four) hours as needed for congestion. , Disp: , Rfl:  .  SYNTHROID 75 MCG tablet, TAKE 1 TABLET (75 MCG TOTAL) BY MOUTH DAILY BEFORE BREAKFAST., Disp: 90 tablet, Rfl: 1 .  terconazole (TERAZOL 3) 0.8 % vaginal cream, Place 1 applicator vaginally at bedtime., Disp: 20 g, Rfl: 0 .  Vitamin D, Ergocalciferol, (DRISDOL) 50000 units CAPS capsule, TAKE 1 CAPSULE BY MOUTH TWICE WEEKLY AS DIRECTED, Disp: , Rfl: 2 .  montelukast (SINGULAIR) 10 MG tablet, Take 1 tablet (10 mg total) by mouth at bedtime., Disp: 30 tablet, Rfl: 0

## 2016-04-02 NOTE — Patient Instructions (Signed)
Take Singulair 10 mg daily Keep taking Claritin Never start smoking again Follow-up with [place] if you have any respiratory difficulty.

## 2016-04-02 NOTE — Telephone Encounter (Signed)
It's already on her allergy list! LMTCB

## 2016-04-03 NOTE — Telephone Encounter (Signed)
Called and spoke with pt and she is aware that the drug allergies are listed the same in her chart.  I explained to her that we cannot control what is printed out on the AVS, and she thought this was taken out of her chart since this was not on her AVS like it was last time.  I explained that they do updates on our systems often, and sometimes those things are taken off the AVS.

## 2016-04-08 ENCOUNTER — Encounter: Payer: Self-pay | Admitting: Internal Medicine

## 2016-04-08 ENCOUNTER — Ambulatory Visit (INDEPENDENT_AMBULATORY_CARE_PROVIDER_SITE_OTHER): Payer: Medicare Other | Admitting: Internal Medicine

## 2016-04-08 VITALS — BP 114/70 | HR 80 | Ht 60.0 in | Wt 123.4 lb

## 2016-04-08 DIAGNOSIS — K5900 Constipation, unspecified: Secondary | ICD-10-CM | POA: Diagnosis not present

## 2016-04-08 DIAGNOSIS — Z86010 Personal history of colonic polyps: Secondary | ICD-10-CM

## 2016-04-08 NOTE — Progress Notes (Signed)
   Subjective:    Patient ID: Elaine Le, female    DOB: [birth year], 55 y.o.   MRN: [id number]  HPI Elaine Le is a 55 year old female with history of IBS, adenomatous colon polyp, GERD, and constipation who is here for follow-up. She also has a history of mitral valve replacement, COPD, hypothyroidism, fibromyalgia and sacroiliitis. She was last seen in the office on 09/28/2015.  She states on the whole she has been feeling better and as if she is getting back to a more normal self for her. She's tried MiraLAX every other day and was using Benefiber daily which she found helpful for her constipation. Previously Linzess wasn't too effective. Benefiber was expensive and so she's been trying to eat beans on a daily basis to get extra fiber. This is helping somewhat but not as much is Benefiber. She is having some issues with bloating after eating. No blood in her stool or melena. No abdominal pain. She is being more active. No recent issues with chest pain or shortness of breath though she does have some mild stable shortness of breath with exertion. She has been released by Dr. [place] Bells from pulmonary for as needed follow-up only. Reflux and heartburn have not been an issue for her of late.  Review of Systems As per history of present illness, otherwise negative  Current Medications, Allergies, Past Medical History, Past Surgical History, Family History and Social History were reviewed in [employer] record.     Objective:   Physical Exam BP 114/70   Pulse 80   Ht 5' (1.524 m)   Wt 123 lb 6 oz (56 kg)   BMI 24.10 kg/m  Constitutional: Well-developed and well-nourished. No distress. HEENT: Normocephalic and atraumatic.  Conjunctivae are normal.  No scleral icterus. Neck: Neck supple. Trachea midline. Cardiovascular: Normal rate, regular rhythm and intact distal pulses. Pulmonary/chest: Effort normal and breath sounds normal. No wheezing, rales or  rhonchi. Abdominal: Soft, nontender, nondistended. Bowel sounds active throughout. There are no masses palpable. No hepatosplenomegaly. Extremities: no clubbing, cyanosis, or edema Neurological: Alert and oriented to person place and time. Skin: Skin is warm and dry.  Psychiatric: Normal mood and affect. Behavior is normal.     Assessment & Plan:  55 year old female with history of IBS, adenomatous colon polyp, GERD, and constipation who is here for follow-up.   1. IBS with constipation -- I have given her a list of generic Benefiber and she will resume this once daily. Continue MiraLAX every other day. We discussed Amitiza but she would like to resume Benefiber as it was previously working better.  2. Colon cancer screening/history of adenoma -- small adenoma 5 years ago. Due for colonoscopy at this time. We discussed but she is hesitant given her significant and severe pneumonia last year requiring prolonged hospitalization and intubation. The idea sedation makes her nervous. We decided to pursue immunohistochemical stool testing for this year and discuss repeat colonoscopy in one years time. She understands FIT is less sensitive than colonoscopy. --FIT now, recall colonoscopy 1 year  20 minutes spent with the patient today. Greater than 50% was spent in counseling and coordination of care with the patient

## 2016-04-08 NOTE — Patient Instructions (Signed)
Please take your Miralax every other day.  Please resume your Benefiber daily.  Your physician has requested that you go to the basement for the following lab work before leaving today: IFOB  You will be due for a recall colonoscopy in 03/28/17. We will send you a reminder in the mail when it gets closer to that time.  If you are age 55 or older, your body mass index should be between 23-30. Your Body mass index is 24.1 kg/m. If this is out of the aforementioned range listed, please consider follow up with your Primary Care Provider.  If you are age 68 or younger, your body mass index should be between 19-25. Your Body mass index is 24.1 kg/m. If this is out of the aformentioned range listed, please consider follow up with your Primary Care Provider.

## 2016-05-01 ENCOUNTER — Other Ambulatory Visit: Payer: Self-pay | Admitting: Pulmonary Disease

## 2016-05-06 ENCOUNTER — Telehealth: Payer: Self-pay | Admitting: Emergency Medicine

## 2016-05-06 NOTE — Telephone Encounter (Signed)
Ok with me 

## 2016-05-06 NOTE — Telephone Encounter (Signed)
Fine with me

## 2016-05-06 NOTE — Telephone Encounter (Signed)
Pt called and requested to switch from Dr Sharlet Salina to [facility]. Is that ok with both of you? Please advise thanks.

## 2016-05-07 ENCOUNTER — Telehealth: Payer: Self-pay | Admitting: Internal Medicine

## 2016-05-07 ENCOUNTER — Encounter: Payer: Self-pay | Admitting: Family

## 2016-05-07 ENCOUNTER — Other Ambulatory Visit: Payer: Medicare Other

## 2016-05-07 ENCOUNTER — Ambulatory Visit (INDEPENDENT_AMBULATORY_CARE_PROVIDER_SITE_OTHER): Payer: Medicare Other | Admitting: Family

## 2016-05-07 VITALS — BP 118/84 | HR 76 | Temp 98.1°F | Resp 14 | Ht 60.0 in | Wt 123.0 lb

## 2016-05-07 DIAGNOSIS — R300 Dysuria: Secondary | ICD-10-CM | POA: Insufficient documentation

## 2016-05-07 LAB — POCT URINALYSIS DIPSTICK
BILIRUBIN UA: NEGATIVE
Blood, UA: NEGATIVE
GLUCOSE UA: NEGATIVE
KETONES UA: NEGATIVE
LEUKOCYTES UA: NEGATIVE
Nitrite, UA: NEGATIVE
Protein, UA: NEGATIVE
Spec Grav, UA: 1.015
Urobilinogen, UA: NEGATIVE
pH, UA: 6

## 2016-05-07 LAB — WET PREP, GENITAL
TRICH WET PREP: NONE SEEN — AB
YEAST WET PREP: NONE SEEN — AB

## 2016-05-07 MED ORDER — TERCONAZOLE 0.8 % VA CREA: 1.0000 | TOPICAL_CREAM | Freq: Every day | VAGINAL | 0 refills | Status: DC

## 2016-05-07 MED ORDER — METRONIDAZOLE 500 MG PO TABS: 500.0000 mg | ORAL_TABLET | Freq: Two times a day (BID) | ORAL | 0 refills | Status: DC

## 2016-05-07 NOTE — Assessment & Plan Note (Signed)
In office urinalysis negative for leukocytes, nitrates, and hematuria. Symptoms are concerning for possible bacterial vaginosis. Wet prep obtained. Urine will be sent for culture. Follow-up and additional treatment pending wet prep results.

## 2016-05-07 NOTE — Patient Instructions (Signed)
Thank you for choosing [facility].  SUMMARY AND INSTRUCTIONS:  Medication:  Your prescription(s) have been submitted to your pharmacy or been printed and provided for you. Please take as directed and contact our office if you believe you are having problem(s) with the medication(s) or have any questions.  Labs:  Please stop by the lab on the lower level of the building for your blood work. Your results will be released to [place] (or called to you) after review, usually within 72 hours after test completion. If any changes need to be made, you will be notified at that same time.  1.) The lab is open from 7:30am to 5:30 pm Monday-Friday 2.) No appointment is necessary 3.) Fasting (if needed) is 6-8 hours after food and drink; black coffee and water are okay   Imaging / Radiology:  Please stop by radiology on the basement level of the building for your x-rays. Your results will be released to [place] (or called to you) after review, usually within 72 hours after test completion. If any treatments or changes are necessary, you will be notified at that same time.  Referrals:  Referrals have been made during this visit. You should expect to hear back from our schedulers in about 7-10 days in regards to establishing an appointment with the specialists we discussed.   Follow up:  If your symptoms worsen or fail to improve, please contact our office for further instruction, or in case of emergency go directly to the emergency room at the closest medical facility.     Urinary Tract Infection, Adult Introduction A urinary tract infection (UTI) is an infection of any part of the urinary tract. The urinary tract includes the:  Kidneys.  Ureters.  Bladder.  Urethra. These organs make, store, and get rid of pee (urine) in the body. Follow these instructions at home:  Take over-the-counter and prescription medicines only as told by your doctor.  If you were prescribed an  antibiotic medicine, take it as told by your doctor. Do not stop taking the antibiotic even if you start to feel better.  Avoid the following drinks:  Alcohol.  Caffeine.  Tea.  Carbonated drinks.  Drink enough fluid to keep your pee clear or pale yellow.  Keep all follow-up visits as told by your doctor. This is important.  Make sure to:  Empty your bladder often and completely. Do not to hold pee for long periods of time.  Empty your bladder before and after sex.  Wipe from front to back after a bowel movement if you are female. Use each tissue one time when you wipe. Contact a doctor if:  You have back pain.  You have a fever.  You feel sick to your stomach (nauseous).  You throw up (vomit).  Your symptoms do not get better after 3 days.  Your symptoms go away and then come back. Get help right away if:  You have very bad back pain.  You have very bad lower belly (abdominal) pain.  You are throwing up and cannot keep down any medicines or water. This information is not intended to replace advice given to you by your health care provider. Make sure you discuss any questions you have with your health care provider. Document Released: 10/01/2007 Document Revised: 09/20/2015 Document Reviewed: 03/05/2015  2017 Elsevier   Bacterial Vaginosis Bacterial vaginosis is a vaginal infection that occurs when the normal balance of bacteria in the vagina is disrupted. It results from an overgrowth of certain  bacteria. This is the most common vaginal infection among women ages 47-44. Because bacterial vaginosis increases your risk for STIs (sexually transmitted infections), getting treated can help reduce your risk for chlamydia, gonorrhea, herpes, and HIV (human immunodeficiency virus). Treatment is also important for preventing complications in pregnant women, because this condition can cause an early (premature) delivery. What are the causes? This condition is caused by an  increase in harmful bacteria that are normally present in small amounts in the vagina. However, the reason that the condition develops is not fully understood. What increases the risk? The following factors may make you more likely to develop this condition:  Having a new sexual partner or multiple sexual partners.  Having unprotected sex.  Douching.  Having an intrauterine device (IUD).  Smoking.  Drug and alcohol abuse.  Taking certain antibiotic medicines.  Being pregnant. You cannot get bacterial vaginosis from toilet seats, bedding, swimming pools, or contact with objects around you. What are the signs or symptoms? Symptoms of this condition include:  Grey or white vaginal discharge. The discharge can also be watery or foamy.  A fish-like odor with discharge, especially after sexual intercourse or during menstruation.  Itching in and around the vagina.  Burning or pain with urination. Some women with bacterial vaginosis have no signs or symptoms. How is this diagnosed? This condition is diagnosed based on:  Your medical history.  A physical exam of the vagina.  Testing a sample of vaginal fluid under a microscope to look for a large amount of bad bacteria or abnormal cells. Your health care provider may use a cotton swab or a small wooden spatula to collect the sample. How is this treated? This condition is treated with antibiotics. These may be given as a pill, a vaginal cream, or a medicine that is put into the vagina (suppository). If the condition comes back after treatment, a second round of antibiotics may be needed. Follow these instructions at home: Medicines  Take over-the-counter and prescription medicines only as told by your health care provider.  Take or use your antibiotic as told by your health care provider. Do not stop taking or using the antibiotic even if you start to feel better. General instructions  If you have a female sexual partner, tell  her that you have a vaginal infection. She should see her health care provider and be treated if she has symptoms. If you have a female sexual partner, he does not need treatment.  During treatment:  Avoid sexual activity until you finish treatment.  Do not douche.  Avoid alcohol as directed by your health care provider.  Avoid breastfeeding as directed by your health care provider.  Drink enough water and fluids to keep your urine clear or pale yellow.  Keep the area around your vagina and rectum clean.  Wash the area daily with warm water.  Wipe yourself from front to back after using the toilet.  Keep all follow-up visits as told by your health care provider. This is important. How is this prevented?  Do not douche.  Wash the outside of your vagina with warm water only.  Use protection when having sex. This includes latex condoms and dental dams.  Limit how many sexual partners you have. To help prevent bacterial vaginosis, it is best to have sex with just one partner (monogamous).  Make sure you and your sexual partner are tested for STIs.  Wear cotton or cotton-lined underwear.  Avoid wearing tight pants and pantyhose, especially during  summer.  Limit the amount of alcohol that you drink.  Do not use any products that contain nicotine or tobacco, such as cigarettes and e-cigarettes. If you need help quitting, ask your health care provider.  Do not use illegal drugs. Where to find more information:  Centers for Disease Control and Prevention: AppraiserFraud.fi  American Sexual Health Association (ASHA): www.ashastd.org  U.S. Department of Health and Financial controller, Office on Women's Health: DustingSprays.pl or SecuritiesCard.it Contact a health care provider if:  Your symptoms do not improve, even after treatment.  You have more discharge or pain when urinating.  You have a fever.  You have pain in your  abdomen.  You have pain during sex.  You have vaginal bleeding between periods. Summary  Bacterial vaginosis is a vaginal infection that occurs when the normal balance of bacteria in the vagina is disrupted.  Because bacterial vaginosis increases your risk for STIs (sexually transmitted infections), getting treated can help reduce your risk for chlamydia, gonorrhea, herpes, and HIV (human immunodeficiency virus). Treatment is also important for preventing complications in pregnant women, because the condition can cause an early (premature) delivery.  This condition is treated with antibiotic medicines. These may be given as a pill, a vaginal cream, or a medicine that is put into the vagina (suppository). This information is not intended to replace advice given to you by your health care provider. Make sure you discuss any questions you have with your health care provider. Document Released: 04/14/2005 Document Revised: 12/29/2015 Document Reviewed: 12/29/2015 Elsevier Interactive Patient Education  2017 [employer].

## 2016-05-07 NOTE — Progress Notes (Signed)
Subjective:    Patient ID: Elaine Le, female    DOB: 1960/11/08, 56 y.o.   MRN: [id number]  Chief Complaint  Patient presents with  . Dysuria    urinary frequency, urine odor and dysuria and states her vagina has foul smell    HPI:  Elaine Le is a 56 y.o. female who  has a past medical history of Acute respiratory failure with hypercapnia ([place]) (06/21/2014); Acute systolic heart failure ([place]) (09/2014); Adenomatous colon polyp; Arthritis; Back pain; Chronic diastolic heart failure ([place]); Chronic pain syndrome; Chronic sinusitis; CN (constipation) (05/25/2014); Community acquired pneumonia; COPD (chronic obstructive pulmonary disease)-PFT pending  (07/17/2014); Diverticulosis; Fatigue; Fibromyalgia; GERD (gastroesophageal reflux disease); Headaches, cluster; Hiatal hernia; adenomatous colonic polyps (03/10/2011); IBS (irritable bowel syndrome) (01/21/2011); Influenza A (06/29/2014); Internal hemorrhoids; Irritable bowel syndrome (IBS); Mitral valve regurgitation; Mitral valve stenosis, moderate (07/11/2014); Muscle pain; Muscular deconditioning (07/05/2014); Night sweats; Pleural effusion; Pneumonia; Protein-calorie malnutrition, severe ([place]) (07/06/2014); 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 and presents today for an acute office visit.   This is a new problem. Associated symptom of urinary frequency, dysuria, and urinary odor has been going on for about 2 weeks . Also notes that her vagina has a foul smell to it. Denies any fevers, back pain, or abdominal pain. Modifying factors include drinking increased amounts of water which has not helped. Describes her urine as slighlty dark on a normal basis.   Allergies  Allergen Reactions  . Montelukast Sodium     Intolerance - effects her mood   . Morphine And  Related Other (See Comments)    Altered mental state  . Other Other (See Comments)    Any antidepressants per pt- causes altered mental state, anger  . Warfarin And Related       Outpatient Medications Prior to Visit  Medication Sig Dispense Refill  . aspirin 81 MG tablet Take 162 mg by mouth daily. Reported on 11/12/2015    . Coenzyme Q10 (CO Q 10) 10 MG CAPS Take by mouth. Reported on 10/08/2015    . furosemide (LASIX) 40 MG tablet TAKE 1 TABLET BY MOUTH ONCE DAILY (Patient taking differently: As needed for swelling) 30 tablet 1  . loratadine (CLARITIN) 5 MG chewable tablet Chew 5 mg by mouth as needed. Reported on 10/08/2015    . polyethylene glycol powder (GLYCOLAX) powder Take 255 g by mouth daily as needed. 255 g 12  . sodium chloride (OCEAN) 0.65 % nasal spray Place 2 sprays into both nostrils every 4 (four) hours as needed for congestion.     Marland Kitchen SYNTHROID 75 MCG tablet TAKE 1 TABLET (75 MCG TOTAL) BY MOUTH DAILY BEFORE BREAKFAST. 90 tablet 1  . terconazole (TERAZOL 3) 0.8 % vaginal cream Place 1 applicator vaginally at bedtime. 20 g 0  . Vitamin D, Ergocalciferol, (DRISDOL) 50000 units CAPS capsule TAKE 1 CAPSULE BY MOUTH TWICE WEEKLY AS DIRECTED  2  . montelukast (SINGULAIR) 10 MG tablet TAKE 1 TABLET BY MOUTH EVERY DAY AT BEDTIME 30 tablet 5   No facility-administered medications prior to visit.      Review of Systems  Constitutional: Negative for chills and fever.  Genitourinary: Positive for dysuria, frequency, urgency and vaginal discharge. Negative for flank pain and hematuria.      Objective:    BP 118/84 (BP Location: Left Arm, Patient Position: Sitting, Cuff Size: Normal)  Pulse 76   Temp 98.1 F (36.7 C) (Oral)   Resp 14   Ht 5' (1.524 m)   Wt 123 lb (55.8 kg)   SpO2 98%   BMI 24.02 kg/m  Nursing note and vital signs reviewed.  Physical Exam  Constitutional: She is oriented to person, place, and time. She appears well-developed and well-nourished. No  distress.  Cardiovascular: Normal rate, regular rhythm, normal heart sounds and intact distal pulses.   Pulmonary/Chest: Effort normal and breath sounds normal.  Genitourinary:  Genitourinary Comments: Patient deferred GU exam.  Neurological: She is alert and oriented to person, place, and time.  Skin: Skin is warm and dry.  Psychiatric: She has a normal mood and affect. Her behavior is normal. Judgment and thought content normal.       Assessment & Plan:   Problem List Items Addressed This Visit      Other   Dysuria - Primary    In office urinalysis negative for leukocytes, nitrates, and hematuria. Symptoms are concerning for possible bacterial vaginosis. Wet prep obtained. Urine will be sent for culture. Follow-up and additional treatment pending wet prep results.       Relevant Orders   POCT urinalysis dipstick (Completed)   Urine culture   Wet prep, genital (Completed)       I have discontinued Elaine Le's montelukast. I am also having her start on metroNIDAZOLE. Additionally, I am having her maintain her sodium chloride, Co Q 10, loratadine, aspirin, polyethylene glycol powder, SYNTHROID, Vitamin D (Ergocalciferol), terconazole, and furosemide.   Follow-up: Return if symptoms worsen or fail to improve.  Mauricio Po, FNP

## 2016-05-07 NOTE — Telephone Encounter (Signed)
Done

## 2016-05-07 NOTE — Telephone Encounter (Signed)
Pt stated Elaine Le send in abx for her today, she was wondering if she can also get terconazole (TERAZOL 3) 0.8 % vaginal cream send in as well due to yeast infection when she takes abx.

## 2016-05-08 LAB — URINE CULTURE: Organism ID, Bacteria: NO GROWTH

## 2016-05-10 NOTE — Progress Notes (Signed)
Cardiology Office Note   Date:  05/12/2016   ID:  Elaine Le, DOB [birth year], MRN [id number]  PCP:  Hoyt Koch, MD  Cardiologist:   Minus Breeding, MD   Chief Complaint  Patient presents with  . MVR      History of Present Illness: Elaine Le is a 56 y.o. female who presents for follow-up after mitral valve replacement. She had probable rheumatic disease with severe regurgitation and moderate stenosis and is now status post minimally invasive valve replacement.   She had a stable replacement with bioprosthesis on echo in May.  Since I last saw her she has done well.  She is not exercising as much as I would like but otherwise stays active.   Past Medical History:  Diagnosis Date  . Acute respiratory failure with hypercapnia ([place]) 06/21/2014  . Acute systolic heart failure ([place]) 09/2014  . Adenomatous colon polyp   . Arthritis   . Back pain   . Chronic diastolic heart failure ([facility])    related to MR/MS  . Chronic pain syndrome   . Chronic sinusitis   . CN (constipation) 05/25/2014  . Community acquired pneumonia   . COPD (chronic obstructive pulmonary disease)-PFT pending  07/17/2014  . Diverticulosis   . Fatigue   . Fibromyalgia   . GERD (gastroesophageal reflux disease)   . Headaches, cluster   . Hiatal hernia   . Hx of adenomatous colonic polyps 03/10/2011   Oct 2012, repeat colon Oct 2017   . IBS (irritable bowel syndrome) 01/21/2011  . Influenza A 06/29/2014  . Internal hemorrhoids   . Irritable bowel syndrome (IBS)   . Mitral valve regurgitation    severe  . Mitral valve stenosis, moderate 07/11/2014  . Muscle pain   . Muscular deconditioning 07/05/2014  . Night sweats   . Pleural effusion   . Pneumonia   . Protein-calorie malnutrition, severe ([place]) 07/06/2014  . 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 S/P minimally invasive mitral valve replacement with bioprosthetic  valve 11/08/2014   25 mm [facility] Mitral bovine bioprosthetic tissue valve placed via right mini thoracotomy approach  . Tobacco abuse     Past Surgical History:  Procedure Laterality Date  . CYSTOSTOMY W/ BLADDER DILATION     at age 40  . LEFT AND RIGHT HEART CATHETERIZATION WITH CORONARY ANGIOGRAM N/A 07/27/2014   Procedure: LEFT AND RIGHT HEART CATHETERIZATION WITH CORONARY ANGIOGRAM;  Surgeon: Burnell Blanks, MD;  Location: [facility] CATH LAB;  Right left heart catheterization: Mild/minimal coronary artery disease. RV: 50/7/17, PA: 48/29 (mean 38), PCWP 33 mmHg  . MITRAL VALVE REPAIR Right 11/08/2014   Procedure: MINIMALLY INVASIVE MITRAL VALVE REPLACEMENT(MVR);  Surgeon: Rexene Alberts, MD;  Location: [facility];  Service: Open Heart Surgery;  Laterality: Right;  . NEUROPLASTY / TRANSPOSITION MEDIAN NERVE AT CARPAL TUNNEL BILATERAL    . TEE WITHOUT CARDIOVERSION N/A 07/10/2014   Procedure: TRANSESOPHAGEAL ECHOCARDIOGRAM (TEE);  Surgeon: Lelon Perla, MD;  Location: [facility] ENDOSCOPY;  Service: Cardiovascular;  55-60%. No regional wall motion modalities. Moderate mitral stenosis with severe regurgitation and moderate to severely dilated left atrium.  . TEE WITHOUT CARDIOVERSION N/A 11/08/2014   Procedure: TRANSESOPHAGEAL ECHOCARDIOGRAM (TEE);  Surgeon: Rexene Alberts, MD;  Location: [place] [place];  Service: Open Heart Surgery;  Laterality: N/A;  . THORACIC OUTLET SURGERY    . TRANSTHORACIC ECHOCARDIOGRAM  07/04/2014   Normal LV Size/Fnx -  EF 60-65% no RWMA; MV: thickened leaflets with doming, fixed Posterior leaflet, anterior leaflet w/ large/shaggy & mobile density.  c/w Rheumatic Mitral disease - moderate MS & Mod-Severe MR.  . TUBAL LIGATION    . VULVA SURGERY       Current Outpatient Prescriptions  Medication Sig Dispense Refill  . aspirin 81 MG tablet Take 162 mg by mouth daily. Reported on 11/12/2015    . Coenzyme Q10 (CO Q 10) 10 MG CAPS Take by mouth. Reported on 10/08/2015    . furosemide  (LASIX) 40 MG tablet TAKE 1 TABLET BY MOUTH ONCE DAILY (Patient taking differently: As needed for swelling) 30 tablet 1  . loratadine (CLARITIN) 5 MG chewable tablet Chew 5 mg by mouth as needed. Reported on 10/08/2015    . metroNIDAZOLE (FLAGYL) 500 MG tablet Take 1 tablet (500 mg total) by mouth 2 (two) times daily. 14 tablet 0  . polyethylene glycol powder (GLYCOLAX) powder Take 255 g by mouth daily as needed. 255 g 12  . sodium chloride (OCEAN) 0.65 % nasal spray Place 2 sprays into both nostrils every 4 (four) hours as needed for congestion.     Marland Kitchen SYNTHROID 75 MCG tablet TAKE 1 TABLET (75 MCG TOTAL) BY MOUTH DAILY BEFORE BREAKFAST. 90 tablet 1  . terconazole (TERAZOL 3) 0.8 % vaginal cream Place 1 applicator vaginally at bedtime. For 3 days. 20 g 0  . Vitamin D, Ergocalciferol, (DRISDOL) 50000 units CAPS capsule TAKE 1 CAPSULE BY MOUTH TWICE WEEKLY AS DIRECTED  2   No current facility-administered medications for this visit.     Allergies:   Montelukast sodium; Morphine and related; Other; and Warfarin and related    ROS:  Please see the history of present illness.   Otherwise, review of systems are positive for none.   All other systems are reviewed and negative.    PHYSICAL EXAM: VS:  BP (!) 146/82   Pulse 72   Ht 5' (1.524 m)   Wt 125 lb (56.7 kg)   BMI 24.41 kg/m  , BMI Body mass index is 24.41 kg/m. GENERAL:  Well appearing NECK:  No jugular venous distention, waveform within normal limits, carotid upstroke brisk and symmetric, Transmitted murmur most likely versus left bruit, no thyromegaly LYMPHATICS:  No cervical, inguinal adenopathy LUNGS:  Clear to auscultation bilaterally BACK:  No CVA tenderness CHEST:  Her left thoracotomy wound well healed. HEART:  PMI not displaced or sustained,S1 and S2 within normal limits, no S3, no S4, no clicks, no rubs, 2 out of 6 brief systolic murmur, no diastolic murmurs ABD:  Flat, positive bowel sounds normal in frequency in pitch, no  bruits, no rebound, no guarding, no midline pulsatile mass, no hepatomegaly, no splenomegaly EXT:  2 plus pulses throughout, trace right lower extremity edema, no cyanosis no clubbing   EKG:  EKG is not  ordered today. Sinus rhythm, rate 72, axis within normal limits, intervals within normal limits, no acute ST-T wave changes.  Recent Labs: 11/09/2014: Magnesium 2.1 01/10/2015: ALT 27; BUN 9; Creatinine, Ser 0.70; Potassium 4.3; Sodium 139 02/01/2015: Hemoglobin 15.1*; Platelets 292.0 03/29/2015: TSH 4.03    Lipid Panel    Component Value Date/Time   TRIG 115 06/28/2014 1400      Wt Readings from Last 3 Encounters:  05/12/16 125 lb (56.7 kg)  05/07/16 123 lb (55.8 kg)  04/08/16 123 lb 6 oz (56 kg)      Other studies Reviewed: Additional studies/ records that were reviewed today  include: Echo    ASSESSMENT AND PLAN:  MVR:  She had a stable mitral valve replacement on echo in May.  No change in therapy is needed.       ATRIAL FIB:  She's had no recurrent arrhythmia. No change in therapy.     Current medicines are reviewed at length with the patient today.  The patient does not have concerns regarding medicines.  The following changes have been made:  None  Labs/ tests ordered today include: None    Disposition:   FU with me in  12 months.    Signed, Minus Breeding, MD  05/12/2016 10:29 AM    [facility]

## 2016-05-12 ENCOUNTER — Ambulatory Visit (INDEPENDENT_AMBULATORY_CARE_PROVIDER_SITE_OTHER): Payer: Medicare Other | Admitting: Cardiology

## 2016-05-12 ENCOUNTER — Encounter: Payer: Self-pay | Admitting: Cardiology

## 2016-05-12 VITALS — BP 146/82 | HR 72 | Ht 60.0 in | Wt 125.0 lb

## 2016-05-12 DIAGNOSIS — Z952 Presence of prosthetic heart valve: Secondary | ICD-10-CM

## 2016-05-12 NOTE — Patient Instructions (Signed)

## 2016-05-15 ENCOUNTER — Other Ambulatory Visit: Payer: Self-pay | Admitting: Internal Medicine

## 2016-05-24 ENCOUNTER — Ambulatory Visit (INDEPENDENT_AMBULATORY_CARE_PROVIDER_SITE_OTHER): Payer: Medicare Other | Admitting: Family Medicine

## 2016-05-24 ENCOUNTER — Encounter: Payer: Self-pay | Admitting: Family Medicine

## 2016-05-24 VITALS — BP 126/80 | HR 80 | Temp 98.5°F | Wt 122.2 lb

## 2016-05-24 DIAGNOSIS — B9689 Other specified bacterial agents as the cause of diseases classified elsewhere: Secondary | ICD-10-CM

## 2016-05-24 DIAGNOSIS — N760 Acute vaginitis: Secondary | ICD-10-CM | POA: Diagnosis not present

## 2016-05-24 DIAGNOSIS — R82998 Other abnormal findings in urine: Secondary | ICD-10-CM

## 2016-05-24 DIAGNOSIS — R8299 Other abnormal findings in urine: Secondary | ICD-10-CM

## 2016-05-24 LAB — POC URINALSYSI DIPSTICK (AUTOMATED)
Bilirubin, UA: NEGATIVE
Blood, UA: NEGATIVE
Glucose, UA: NEGATIVE
KETONES UA: NEGATIVE
Leukocytes, UA: NEGATIVE
Nitrite, UA: NEGATIVE
PH UA: 6
PROTEIN UA: NEGATIVE
SPEC GRAV UA: 1.01
Urobilinogen, UA: 0.2

## 2016-05-24 MED ORDER — METRONIDAZOLE 0.75 % VA GEL: 1.0000 | Freq: Every day | VAGINAL | 0 refills | Status: DC

## 2016-05-24 MED ORDER — CLINDAMYCIN HCL 300 MG PO CAPS: 300.0000 mg | ORAL_CAPSULE | Freq: Two times a day (BID) | ORAL | 0 refills | Status: DC

## 2016-05-24 NOTE — Progress Notes (Signed)
Dr. Frederico Hamman T. Esaias Cleavenger, MD, [facility] [place], [address] Phone: [phone] Fax: [phone]  05/24/2016  Patient: Elaine Le, MRN: [id number], DOB: 12-28-1960, 56 y.o.  Primary Physician:  Hoyt Koch, MD   Chief Complaint  Patient presents with  . dark urine  . urine odor  . Back Pain   Subjective:   Elaine Le is a 56 y.o. very pleasant female patient who presents with the following:  Urine culture is negative UA neg today + BV 14 days ago on wet prep s/p flagyl 500 mg orally Felt fine while on meds, then discharge and symptoms came back Urine is dark again.  Past Medical History, Surgical History, Social History, Family History, Problem List, Medications, and Allergies have been reviewed and updated if relevant.  Patient Active Problem List   Diagnosis Date Noted  . Dysuria 05/07/2016  . Sinusitis, acute 11/23/2015  . Routine general medical examination at a health care facility 11/23/2015  . Localized swelling of both lower legs R> L 05/07/2015  . Dyspnea 03/08/2015  . Fatigue 03/08/2015  . Hypothyroidism 02/14/2015  . Weight gain 01/10/2015  . Nasal polyps 11/30/2014  . S/P minimally invasive mitral valve replacement with bioprosthetic valve 11/08/2014  . Chronic diastolic heart failure ([place])   . CAD- mild, non obstructive at cath 07/27/14 07/28/2014  . Chronic pain syndrome   . Tobacco abuse   . Acute on chronic respiratory failure with hypoxia ([place])   . COPD (chronic obstructive pulmonary disease)-mild 07/17/2014  . 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 Hx of adenomatous colonic polyps 03/10/2011  . IBS (irritable bowel syndrome) 01/21/2011  . GERD (gastroesophageal reflux disease) 01/21/2011  . Fibromyalgia 01/21/2011    Past Medical History:  Diagnosis Date  . Acute respiratory failure with hypercapnia ([place]) 06/21/2014  . Acute systolic heart failure  ([place]) 09/2014  . Adenomatous colon polyp   . Arthritis   . Back pain   . Chronic diastolic heart failure ([facility])    related to MR/MS  . Chronic pain syndrome   . Chronic sinusitis   . CN (constipation) 05/25/2014  . Community acquired pneumonia   . COPD (chronic obstructive pulmonary disease)-PFT pending  07/17/2014  . Diverticulosis   . Fatigue   . Fibromyalgia   . GERD (gastroesophageal reflux disease)   . Headaches, cluster   . Hiatal hernia   . Hx of adenomatous colonic polyps 03/10/2011   Oct 2012, repeat colon Oct 2017   . IBS (irritable bowel syndrome) 01/21/2011  . Influenza A 06/29/2014  . Internal hemorrhoids   . Irritable bowel syndrome (IBS)   . Mitral valve regurgitation    severe  . Mitral valve stenosis, moderate 07/11/2014  . Muscle pain   . Muscular deconditioning 07/05/2014  . Night sweats   . Pleural effusion   . Pneumonia   . Protein-calorie malnutrition, severe ([place]) 07/06/2014  . 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 S/P minimally invasive mitral valve replacement with bioprosthetic valve 11/08/2014   25 mm [facility] Mitral bovine bioprosthetic tissue valve placed via right mini thoracotomy approach  . Tobacco abuse     Past Surgical History:  Procedure Laterality Date  . CYSTOSTOMY W/ BLADDER DILATION     at age 89  . LEFT AND RIGHT HEART CATHETERIZATION WITH CORONARY ANGIOGRAM N/A 07/27/2014   Procedure: LEFT AND RIGHT  HEART CATHETERIZATION WITH CORONARY ANGIOGRAM;  Surgeon: Burnell Blanks, MD;  Location: [facility] CATH LAB;  Right left heart catheterization: Mild/minimal coronary artery disease. RV: 50/7/17, PA: 48/29 (mean 38), PCWP 33 mmHg  . MITRAL VALVE REPAIR Right 11/08/2014   Procedure: MINIMALLY INVASIVE MITRAL VALVE REPLACEMENT(MVR);  Surgeon: Rexene Alberts, MD;  Location: [facility];  Service: Open Heart Surgery;  Laterality: Right;  . NEUROPLASTY / TRANSPOSITION MEDIAN NERVE AT  CARPAL TUNNEL BILATERAL    . TEE WITHOUT CARDIOVERSION N/A 07/10/2014   Procedure: TRANSESOPHAGEAL ECHOCARDIOGRAM (TEE);  Surgeon: Lelon Perla, MD;  Location: [facility] ENDOSCOPY;  Service: Cardiovascular;  55-60%. No regional wall motion modalities. Moderate mitral stenosis with severe regurgitation and moderate to severely dilated left atrium.  . TEE WITHOUT CARDIOVERSION N/A 11/08/2014   Procedure: TRANSESOPHAGEAL ECHOCARDIOGRAM (TEE);  Surgeon: Rexene Alberts, MD;  Location: [facility];  Service: Open Heart Surgery;  Laterality: N/A;  . THORACIC OUTLET SURGERY    . TRANSTHORACIC ECHOCARDIOGRAM  07/04/2014   Normal LV Size/Fnx - EF 60-65% no RWMA; MV: thickened leaflets with doming, fixed Posterior leaflet, anterior leaflet w/ large/shaggy & mobile density.  c/w Rheumatic Mitral disease - moderate MS & Mod-Severe MR.  . TUBAL LIGATION    . VULVA SURGERY      Social History   Social History  . Marital status: Single    Spouse name: N/A  . Number of children: 1  . Years of education: N/A   Occupational History  . disabled    Social History Main Topics  . Smoking status: Former Smoker    Packs/day: 0.50    Years: 35.00    Types: Cigarettes    Quit date: 06/17/2014  . Smokeless tobacco: Never Used  . Alcohol use No     Comment: rare  . Drug use: No  . Sexual activity: No   Other Topics Concern  . Not on file   Social History Narrative  . No narrative on file    Family History  Problem Relation Age of Onset  . Kidney cancer Mother   . Colon polyps Mother   . Irritable bowel syndrome Mother   . Diabetes Sister   . Liver disease Sister   . Irritable bowel syndrome Daughter   . Diabetes Brother   . Colon cancer Neg Hx   . Thyroid disease Neg Hx     Allergies  Allergen Reactions  . Montelukast Sodium     Intolerance - effects her mood   . Morphine And Related Other (See Comments)    Altered mental state  . Other Other (See Comments)    Any antidepressants per pt- causes  altered mental state, anger  . Warfarin And Related     Medication list reviewed and updated in full in [place].   GEN: No acute illnesses, no fevers, chills. GI: No n/v/d, eating normally Pulm: No SOB Interactive and getting along well at home.  Otherwise, ROS is as per the HPI.  Objective:   BP 126/80 (BP Location: Left Arm, Patient Position: Sitting, Cuff Size: Normal)   Pulse 80   Temp 98.5 F (36.9 C) (Oral)   Wt 122 lb 4 oz (55.5 kg)   SpO2 99%   BMI 23.88 kg/m   GEN: WDWN, NAD, Non-toxic, A & O x 3 HEENT: Atraumatic, Normocephalic. Neck supple. No masses, No LAD. Ears and Nose: No external deformity. ABD: S, NT, ND, + BS, No rebound, No HSM  EXTR: No c/c/e NEURO  Normal gait.  PSYCH: Normally interactive. Conversant. Not depressed or anxious appearing.  Calm demeanor.   Laboratory and Imaging Data: Recent Results (from the past 2160 hour(s))  POCT urinalysis dipstick     Status: None   Collection Time: 05/07/16 10:59 AM  Result Value Ref Range   Color, UA pale yellow    Clarity, UA clear    Glucose, UA negative    Bilirubin, UA negative    Ketones, UA negative    Spec Grav, UA 1.015    Blood, UA negative    pH, UA 6.0    Protein, UA negative    Urobilinogen, UA negative    Nitrite, UA negative    Leukocytes, UA Negative Negative  Urine culture     Status: None   Collection Time: 05/07/16 12:44 PM  Result Value Ref Range   Organism ID, Bacteria NO GROWTH   Wet prep, genital     Status: Abnormal   Collection Time: 05/07/16 12:46 PM  Result Value Ref Range   WBC, Wet Prep HPF POC Moderate(11-16/hpf) (A) None   Bacteria Many(17-25/hpf) (A) None   Trich, Wet Prep None Seen (A) None   Yeast Wet Prep HPF POC None Seen (A) None   Clue Cells Wet Prep HPF POC Moderate(7-12/hpf) (A) None  POCT Urinalysis Dipstick (Automated)     Status: Normal   Collection Time: 05/24/16 10:24 AM  Result Value Ref Range   Color, UA Light Yellow    Clarity, UA  Clear    Glucose, UA Negative    Bilirubin, UA Negative    Ketones, UA Negative    Spec Grav, UA 1.010    Blood, UA Negative    pH, UA 6.0    Protein, UA Negative    Urobilinogen, UA 0.2    Nitrite, UA Negative    Leukocytes, UA Negative Negative     Assessment and Plan:   Bacterial vaginosis  Dark urine - Plan: POCT Urinalysis Dipstick (Automated)  Return of bv Change to clinda and add pv meds  Follow-up: No Follow-up on file.  Meds ordered this encounter  Medications  . clindamycin (CLEOCIN) 300 MG capsule    Sig: Take 1 capsule (300 mg total) by mouth 2 (two) times daily.    Dispense:  14 capsule    Refill:  0  . metroNIDAZOLE (METROGEL) 0.75 % vaginal gel    Sig: Place 1 Applicatorful vaginally at bedtime. For 5 days    Dispense:  70 g    Refill:  0   Medications Discontinued During This Encounter  Medication Reason  . aspirin 81 MG tablet Patient has not taken in last 30 days  . metroNIDAZOLE (FLAGYL) 500 MG tablet Error  . Vitamin D, Ergocalciferol, (DRISDOL) 50000 units CAPS capsule    Orders Placed This Encounter  Procedures  . POCT Urinalysis Dipstick (Automated)    Signed,  Sahiti Joswick T. Remmington Teters, MD   Allergies as of 05/24/2016      Reactions   Montelukast Sodium    Intolerance - effects her mood    Morphine And Related Other (See Comments)   Altered mental state   Other Other (See Comments)   Any antidepressants per pt- causes altered mental state, anger   Warfarin And Related       Medication List       Accurate as of 05/24/16 10:52 AM. Always use your most recent med list.          clindamycin 300 MG capsule  Commonly known as:  CLEOCIN Take 1 capsule (300 mg total) by mouth 2 (two) times daily.   Co Q 10 10 MG Caps Take by mouth. Reported on 10/08/2015   furosemide 40 MG tablet Commonly known as:  LASIX TAKE 1 TABLET BY MOUTH ONCE DAILY   loratadine 5 MG chewable tablet Commonly known as:  CLARITIN Chew 5 mg by mouth as needed.  Reported on 10/08/2015   metroNIDAZOLE 0.75 % vaginal gel Commonly known as:  METROGEL Place 1 Applicatorful vaginally at bedtime. For 5 days   polyethylene glycol powder powder Commonly known as:  GLYCOLAX Take 255 g by mouth daily as needed.   sodium chloride 0.65 % nasal spray Commonly known as:  OCEAN Place 2 sprays into both nostrils every 4 (four) hours as needed for congestion.   SYNTHROID 75 MCG tablet Generic drug:  levothyroxine TAKE 1 TABLET (75 MCG TOTAL) BY MOUTH DAILY BEFORE BREAKFAST.   terconazole 0.8 % vaginal cream Commonly known as:  TERAZOL 3 Place 1 applicator vaginally at bedtime. For 3 days.

## 2016-05-24 NOTE — Progress Notes (Signed)
Pre visit review using our clinic review tool, if applicable. No additional management support is needed unless otherwise documented below in the visit note. 

## 2016-06-19 ENCOUNTER — Telehealth: Payer: Self-pay | Admitting: Internal Medicine

## 2016-06-19 NOTE — Telephone Encounter (Signed)
Spoke with patient regarding awv. Patient stated that she will give office a call when she is ready to schedule appt.

## 2016-08-08 ENCOUNTER — Other Ambulatory Visit: Payer: Self-pay | Admitting: Internal Medicine

## 2016-11-12 ENCOUNTER — Other Ambulatory Visit: Payer: Self-pay | Admitting: Internal Medicine

## 2016-11-18 IMAGING — CT CT ANGIO CHEST
1 of 2 series · 18 of 32 positions shown · IV contrast (OMNIPAQUE 350)
Comparison: Chest radiographs 1181 hours today. CT Abdomen and
Pelvis 07/23/2011.

CLINICAL DATA: 53-year-old female with shortness of breath and
cough since [REDACTED]. Neck and back pain. Initial encounter. Hypoxia

EXAM:
CT ANGIOGRAPHY CHEST WITH CONTRAST
TECHNIQUE: Multidetector CT imaging of the chest was performed using the
standard protocol during bolus administration of intravenous
contrast. Multiplanar CT image reconstructions and MIPs were
obtained to evaluate the vascular anatomy.
CONTRAST:  100mL OMNIPAQUE IOHEXOL 350 MG/ML SOLN

[Series 12: thins for pacs · axial · 0.67mm/px · z∈[-237,+6]mm · 18 of 271 slices shown]
[im 14/271  lung]
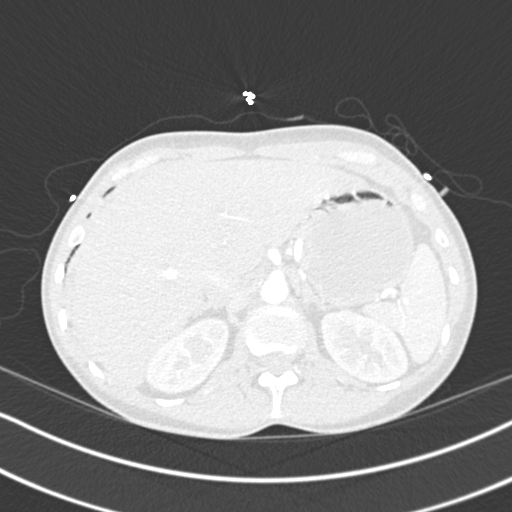
[im 28/271  mediastinal]
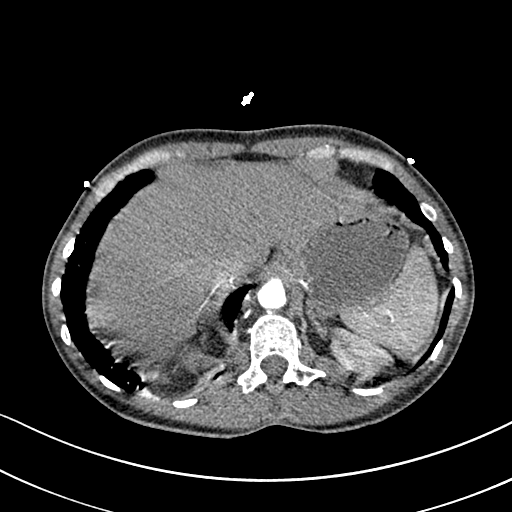
[im 55/271  lung]
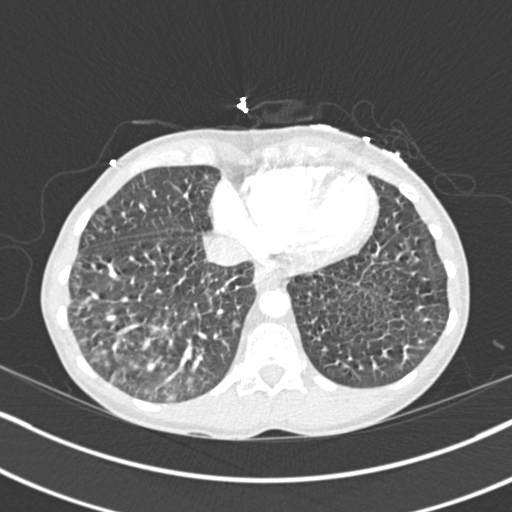
[im 68/271  mediastinal]
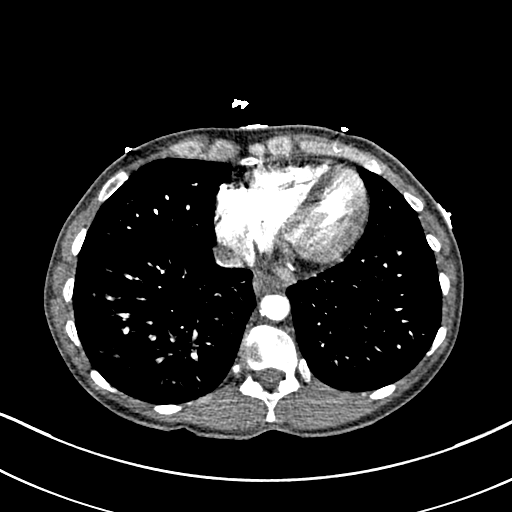
[im 82/271  lung]
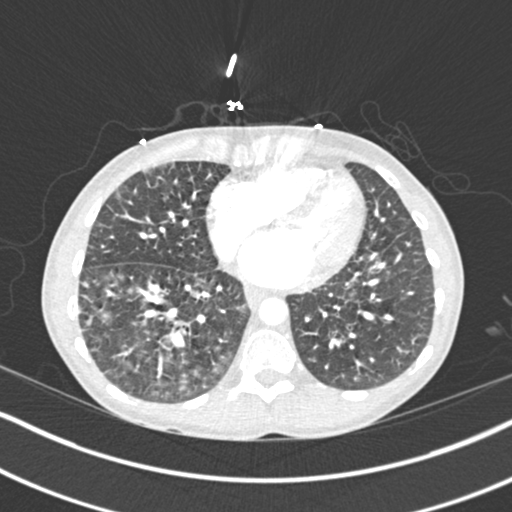
[im 91/271  mediastinal]
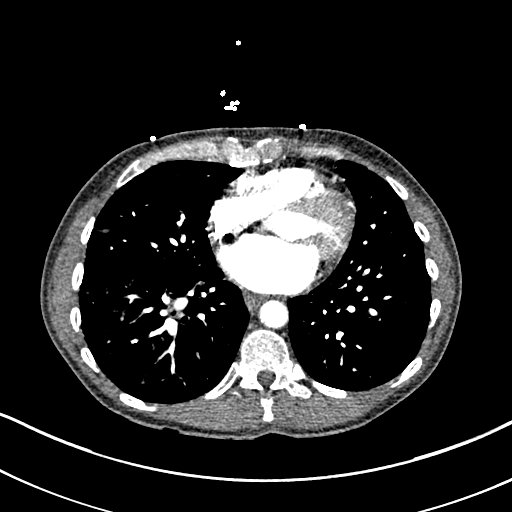
[im 95/271  lung]
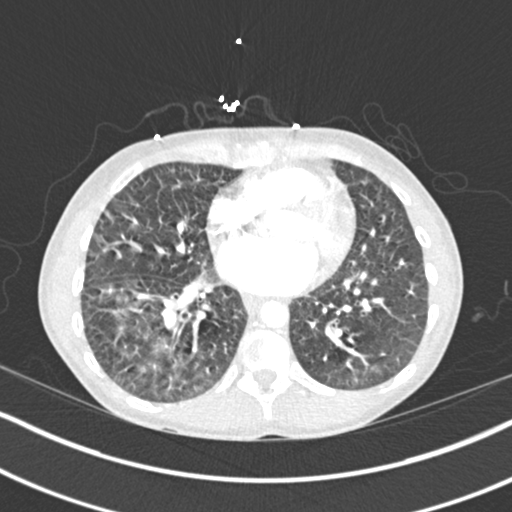
[im 122/271  mediastinal]
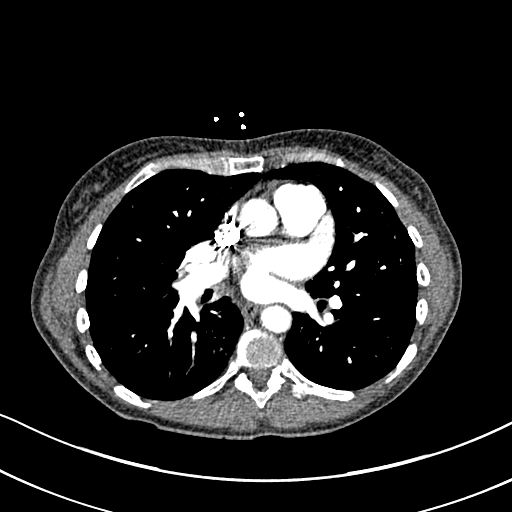
[im 126/271  lung]
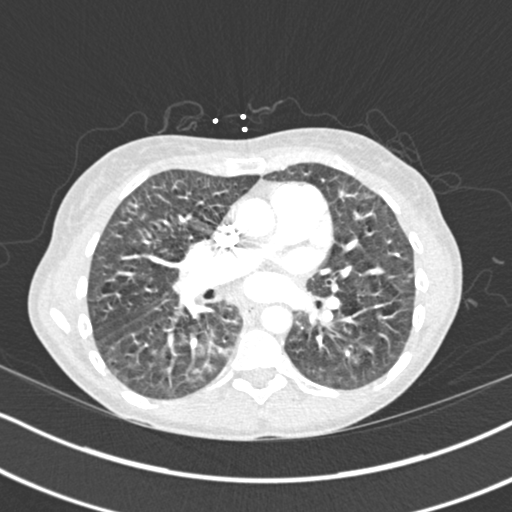
[im 136/271  mediastinal]
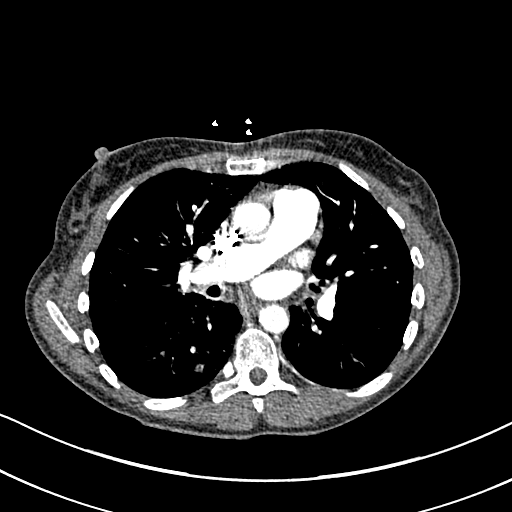
[im 149/271  lung]
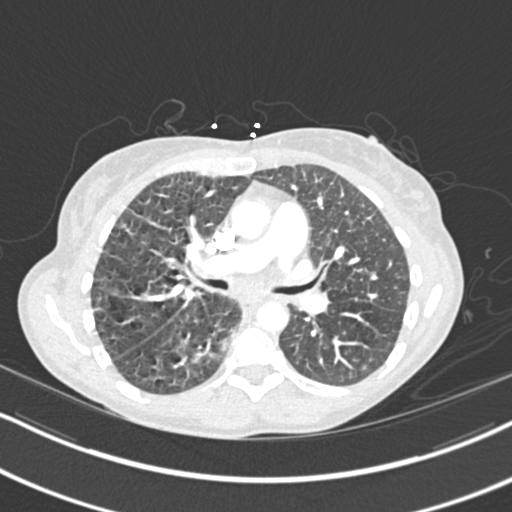
[im 176/271  mediastinal]
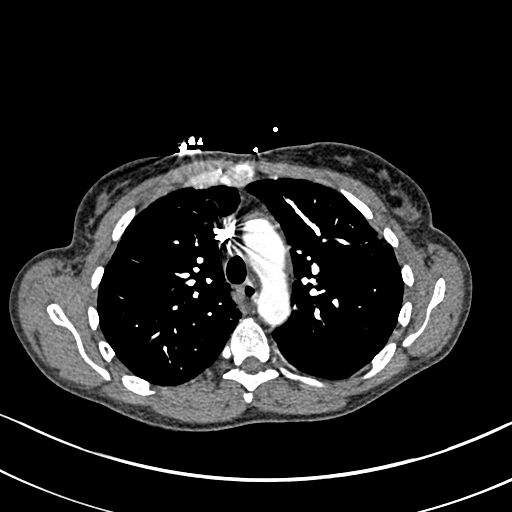
[im 181/271  lung]
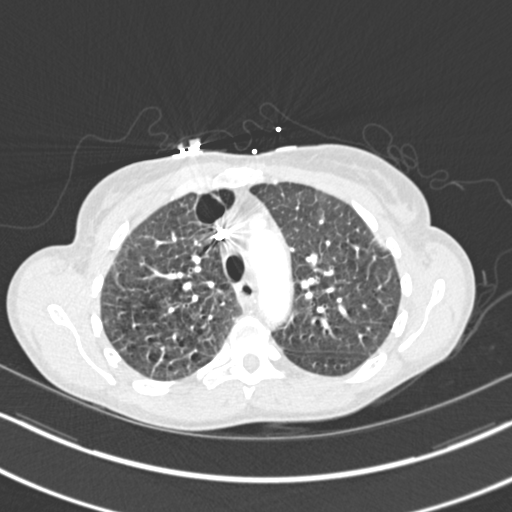
[im 190/271  mediastinal]
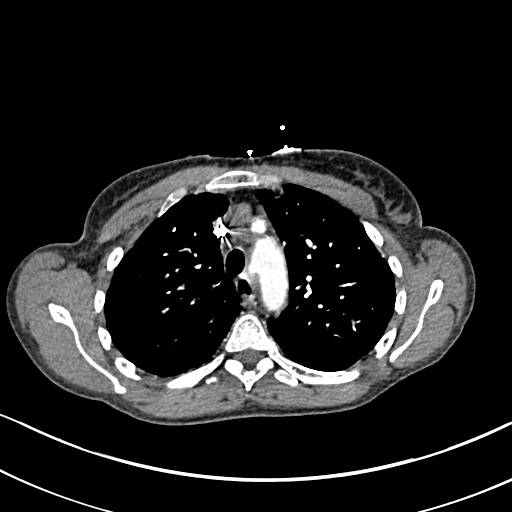
[im 203/271  lung]
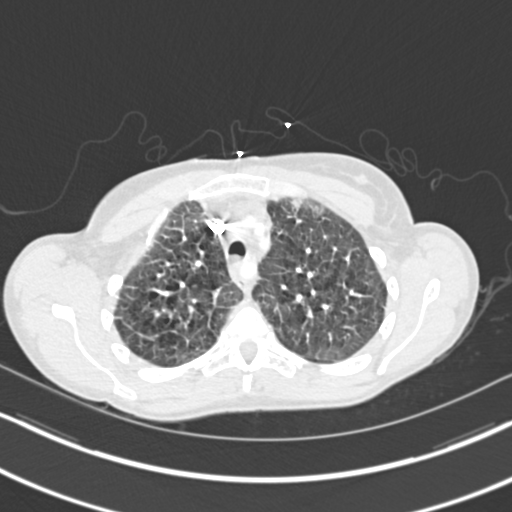
[im 217/271  mediastinal]
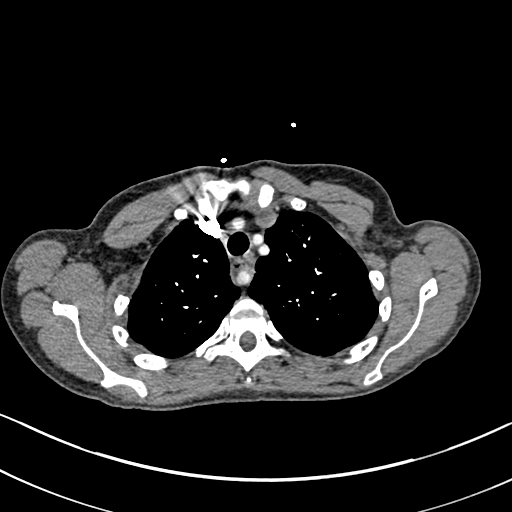
[im 244/271  lung]
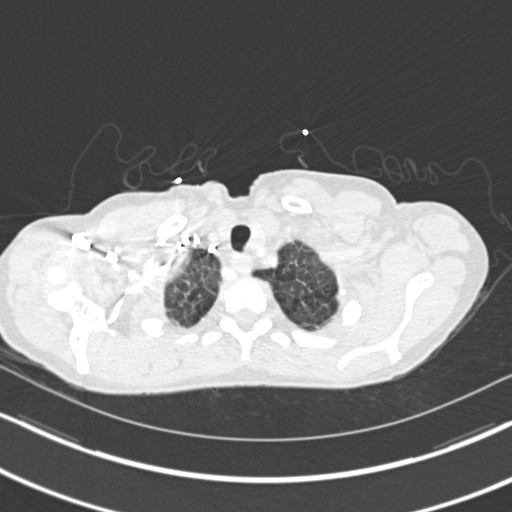
[im 257/271  mediastinal]
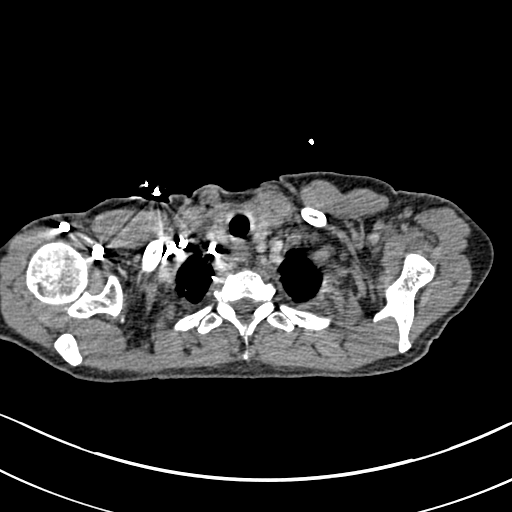

[18 of 32 positions shown; findings below may reference images not displayed]

FINDINGS: Good contrast bolus timing in the pulmonary arterial tree.
Respiratory motion artifact in the lower lobes. No focal filling
defect identified in the pulmonary arterial tree to suggest the
presence of acute pulmonary embolism.

Compared to the lung bases on 07/23/2011, there is diffuse pulmonary
septal thickening. There is also vague and nodular
peribronchovascular in the lungs bilaterally, slightly greater on
the right and maximal in the lower lobe. There is superimposed
central lobular upper lobe predominant emphysema. Small right side
[place] hernia.

No pericardial or pleural effusion. Mildly enlarged hilar lymph
nodes on the right. Indistinct increased mediastinal soft tissue
about the carina measuring up to 10 mm short axis. Aberrant right
subclavian artery origin. Increased pretracheal soft tissue
measuring up to 9 mm short axis at the level of the great vessels.
Soft and calcified plaque in the descending thoracic aorta and
continuing into the abdominal aorta.

Negative visualized liver, spleen, adrenal glands, kidneys, and
bowel in the upper abdomen. No axillary lymphadenopathy.

No acute osseous abnormality identified.

Review of the MIP images confirms the above findings.
IMPRESSION: 1.  No evidence of acute pulmonary embolus.
2. Abnormal lungs with a combination of septal thickening and
nodular peribronchovascular opacity. Underlying emphysema. No
pleural fluid. Favor bilateral pneumonia over acute pulmonary edema.
3. Increased right hilar and mediastinal soft tissue favored to be
reactive lymphadenopathy.
4. Aortic atherosclerosis.  Aberrant right subclavian artery.

## 2016-11-19 IMAGING — DX DG CHEST 1V PORT
1 series · 1 of 1 positions shown · non-contrast
Comparison: Portable chest x-ray June 21, 2014

CLINICAL DATA: Intubated patient, acute respiratory failure.

EXAM:
PORTABLE CHEST - 1 VIEW

[chest ap]
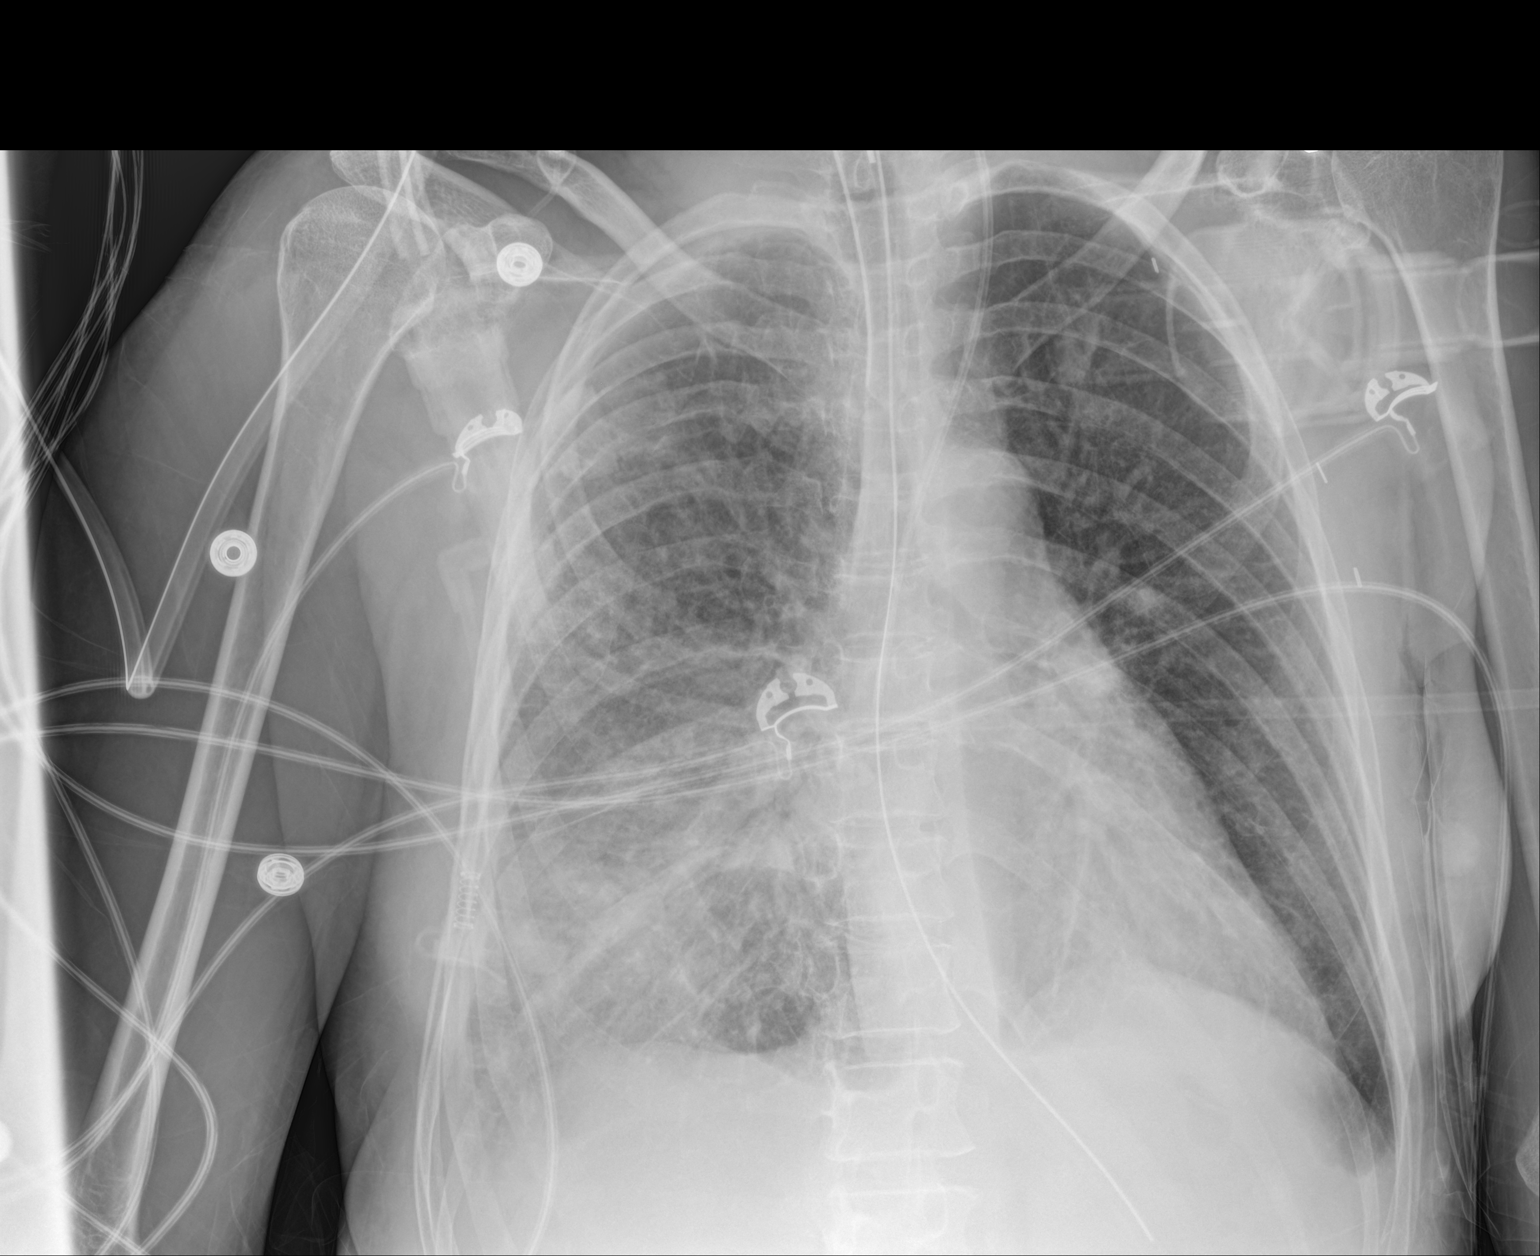

[1 of 1 positions shown; findings below may reference images not displayed]

FINDINGS: The endotracheal tube tip lies 5.2 cm above the crotch of the
carina. The esophagogastric tube tip projects below the GE junction.
The left internal jugular venous catheter tip projects over the
proximal portion of the SVC. The cardiac silhouette is normal in
size. The pulmonary vascularity is not engorged. The interstitial
markings of both lungs are increased there is increasing density in
the right infrahilar region. There is a small correction there small
bilateral pleural effusions.
IMPRESSION: Persistent pulmonary interstitial edema superimposed upon COPD.
Right lower lobe atelectasis or pneumonia atelectasis or pneumonia.
There are small bilateral pleural effusions. The support tubes and
lines are in appropriate position radiographically.

## 2016-11-20 IMAGING — DX DG CHEST 1V PORT
1 series · 1 of 1 positions shown · non-contrast
Comparison: June 22, 2014

CLINICAL DATA: Hypoxia

EXAM:
PORTABLE CHEST - 1 VIEW

[chest ap]
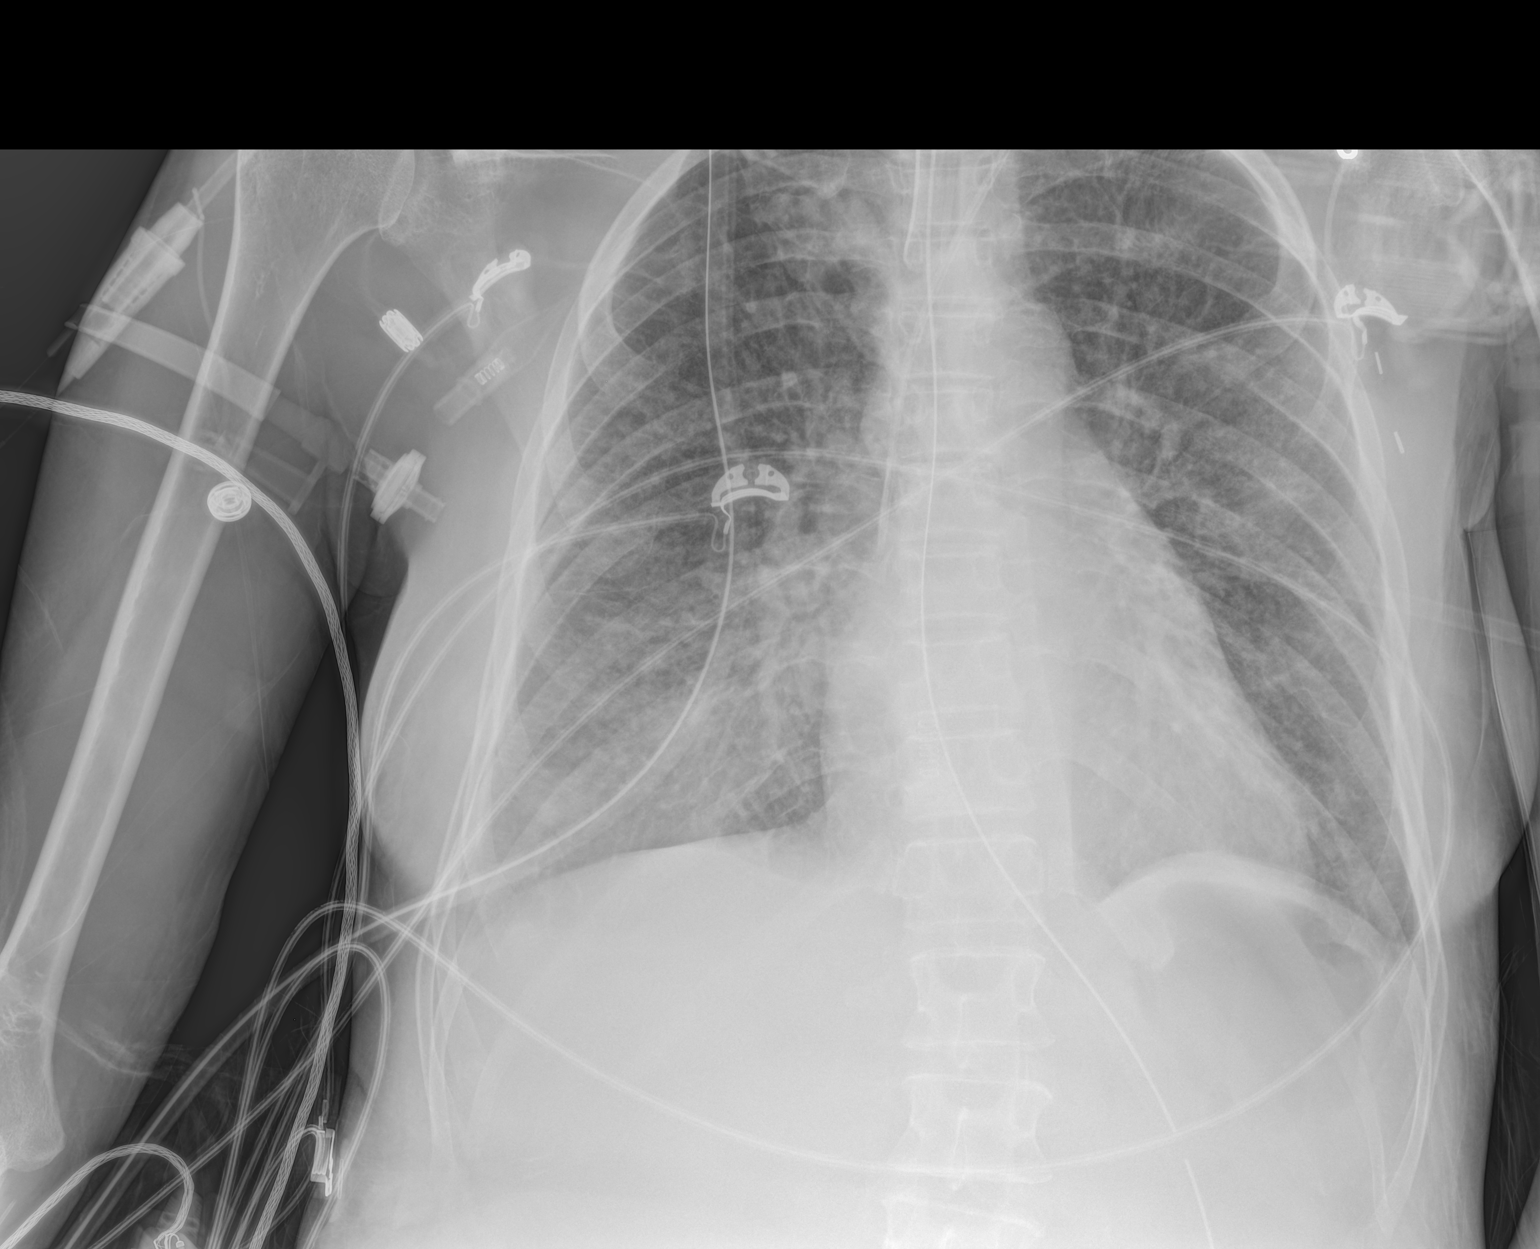

[1 of 1 positions shown; findings below may reference images not displayed]

FINDINGS: Endotracheal tube tip is 5.3 cm above the carina. Central catheter
tip is in the superior vena cava. Nasogastric tube tip and side port
are in the stomach. No pneumothorax. There is underlying emphysema.
There is generalized interstitial edema with mild alveolar edema in
the right base. No new opacity. Heart size is upper normal with
pulmonary vascularity within normal limits.
IMPRESSION: Tube and catheter positions as described without pneumothorax. The
appearance is consistent with a degree of post congestive heart
failure superimposed on emphysematous change. Suspect mild alveolar
edema in the right base, although superimposed pneumonia in the
right base cannot be excluded radiographically.

## 2017-02-10 DIAGNOSIS — J310 Chronic rhinitis: Secondary | ICD-10-CM | POA: Insufficient documentation

## 2017-02-10 DIAGNOSIS — J342 Deviated nasal septum: Secondary | ICD-10-CM | POA: Insufficient documentation

## 2017-02-10 DIAGNOSIS — J343 Hypertrophy of nasal turbinates: Secondary | ICD-10-CM | POA: Insufficient documentation

## 2017-02-10 DIAGNOSIS — J324 Chronic pansinusitis: Secondary | ICD-10-CM | POA: Insufficient documentation

## 2017-03-03 ENCOUNTER — Other Ambulatory Visit (INDEPENDENT_AMBULATORY_CARE_PROVIDER_SITE_OTHER): Payer: Medicare Other

## 2017-03-03 ENCOUNTER — Ambulatory Visit (INDEPENDENT_AMBULATORY_CARE_PROVIDER_SITE_OTHER): Payer: Medicare Other | Admitting: Pulmonary Disease

## 2017-03-03 ENCOUNTER — Ambulatory Visit (INDEPENDENT_AMBULATORY_CARE_PROVIDER_SITE_OTHER)
Admission: RE | Admit: 2017-03-03 | Discharge: 2017-03-03 | Disposition: A | Discharge status: Home / Self Care | Payer: Medicare Other | Source: Ambulatory Visit | Attending: Pulmonary Disease | Admitting: Pulmonary Disease

## 2017-03-03 ENCOUNTER — Encounter: Payer: Self-pay | Admitting: Pulmonary Disease

## 2017-03-03 VITALS — BP 118/64 | HR 78 | Ht 60.0 in | Wt 123.0 lb

## 2017-03-03 DIAGNOSIS — R0600 Dyspnea, unspecified: Secondary | ICD-10-CM

## 2017-03-03 DIAGNOSIS — R5382 Chronic fatigue, unspecified: Secondary | ICD-10-CM | POA: Diagnosis not present

## 2017-03-03 DIAGNOSIS — J432 Centrilobular emphysema: Secondary | ICD-10-CM | POA: Diagnosis not present

## 2017-03-03 DIAGNOSIS — G4733 Obstructive sleep apnea (adult) (pediatric): Secondary | ICD-10-CM

## 2017-03-03 LAB — CBC WITH DIFFERENTIAL/PLATELET
BASOS PCT: 0.9 % (ref 0.0–3.0)
Basophils Absolute: 0.1 10*3/uL (ref 0.0–0.1)
EOS ABS: 0.1 10*3/uL (ref 0.0–0.7)
EOS PCT: 0.9 % (ref 0.0–5.0)
HEMATOCRIT: 43.5 % (ref 36.0–46.0)
HEMOGLOBIN: 14.7 g/dL (ref 12.0–15.0)
LYMPHS PCT: 21.5 % (ref 12.0–46.0)
Lymphs Abs: 1.4 10*3/uL (ref 0.7–4.0)
MCHC: 33.8 g/dL (ref 30.0–36.0)
MCV: 88.4 fl (ref 78.0–100.0)
Monocytes Absolute: 0.4 10*3/uL (ref 0.1–1.0)
Monocytes Relative: 5.4 % (ref 3.0–12.0)
NEUTROS ABS: 4.7 10*3/uL (ref 1.4–7.7)
Neutrophils Relative %: 71.3 % (ref 43.0–77.0)
PLATELETS: 253 10*3/uL (ref 150.0–400.0)
RBC: 4.92 Mil/uL (ref 3.87–5.11)
RDW: 13.2 % (ref 11.5–15.5)
WBC: 6.6 10*3/uL (ref 4.0–10.5)

## 2017-03-03 MED ORDER — IPRATROPIUM-ALBUTEROL 20-100 MCG/ACT IN AERS: 1.0000 | INHALATION_SPRAY | Freq: Four times a day (QID) | RESPIRATORY_TRACT | 5 refills | Status: DC

## 2017-03-03 NOTE — Progress Notes (Signed)
Subjective:    Patient ID: Elaine Le, female    DOB: [birth year], 56 y.o.   MRN: [id number]  Synopsis: Majel was hospitalized for a severe exacerbation of COPD with pneumonia in early 2016. She established care with the [facility] afterwards.  She had endocarditis and had a valve replacement in 2016.  HPI Chief Complaint  Patient presents with  . Follow-up    pt c/o gradual worsening SOB with exertion, worse with incline.     [name] is back because she is more short of breath.   She got a puppy recently and she feels short of breath when climbing hills.   > she feels like she is not getting a deep enough breath > she walks at a fairly fast pace and she can talk, but if she pushes herself at a running pace she feels more short of breath  She says that prior to having the dog she wasn't walking much, but now she is out more and walking as much as 5 miles at a time now.   She still feels weak compared to prior to her hospitalization.  Specifically she feels like her legs aren't as strong as they used to be.  She will feel generalized fatigue from time to time as well.    She also notes pain in her right heel when she first gets up and walks.  She also has the sensation that she cannot lift her right leg as well as she used to.  Past Medical History:  Diagnosis Date  . Acute respiratory failure with hypercapnia ([place]) 06/21/2014  . Acute systolic heart failure ([place]) 09/2014  . Adenomatous colon polyp   . Arthritis   . Back pain   . Chronic diastolic heart failure (HCC)    related to MR/MS  . Chronic pain syndrome   . Chronic sinusitis   . CN (constipation) 05/25/2014  . Community acquired pneumonia   . COPD (chronic obstructive pulmonary disease)-PFT pending  07/17/2014  . Diverticulosis   . Fatigue   . Fibromyalgia   . GERD (gastroesophageal reflux disease)   . Headaches, cluster   . Hiatal hernia   . Hx of adenomatous colonic polyps 03/10/2011   Oct 2012,  repeat colon Oct 2017   . IBS (irritable bowel syndrome) 01/21/2011  . Influenza A 06/29/2014  . Internal hemorrhoids   . Irritable bowel syndrome (IBS)   . Mitral valve regurgitation    severe  . Mitral valve stenosis, moderate 07/11/2014  . Muscle pain   . Muscular deconditioning 07/05/2014  . Night sweats   . Pleural effusion   . Pneumonia   . Protein-calorie malnutrition, severe ([place]) 07/06/2014  . 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 S/P minimally invasive mitral valve replacement with bioprosthetic valve 11/08/2014   25 mm [facility] Mitral bovine bioprosthetic tissue valve placed via right mini thoracotomy approach  . Tobacco abuse      Review of Systems  Constitutional: Positive for fatigue. Negative for chills and fever.  HENT: Positive for postnasal drip, rhinorrhea and sinus pressure.   Respiratory: Negative for cough, shortness of breath and wheezing.   Cardiovascular: Negative for chest pain and leg swelling.       Objective:   Physical Exam Vitals:   03/03/17 1015  BP: 118/64  Pulse: 78  SpO2: 100%  Weight: 123 lb (55.8 kg)  Height: 5' (1.524 m)  RA  Gen: well  appearing HENT: OP clear, TM's clear, neck supple PULM: CTA B, normal percussion CV: RRR, slight murmur, trace edema GI: BS+, soft, nontender Derm: no cyanosis or rash Psyche: normal mood and affect   CBC    Component Value Date/Time   WBC 7.9 02/01/2015 1037   RBC 5.27 (H) 02/01/2015 1037   HGB 15.1 (H) 02/01/2015 1037   HCT 45.3 02/01/2015 1037   PLT 292.0 02/01/2015 1037   MCV 86.1 02/01/2015 1037   MCH 28.6 11/14/2014 0445   MCHC 33.4 02/01/2015 1037   RDW 15.0 02/01/2015 1037   LYMPHSABS 2.0 02/01/2015 1037   MONOABS 0.5 02/01/2015 1037   EOSABS 0.1 02/01/2015 1037   BASOSABS 0.1 02/01/2015 1037   PFT: 08/23/2014 pulmonary function test ratio 73%, flow volume loop consistent with obstruction, FEV1 1.68 L (70%  predicted, 9% change with bronchodilator), total lung capacity 4.26 L (94% predicted), DLCO 14.87, (76% protected)  Records from a visit with primary care earlier this year reviewed where she was treated for a UTI.     Assessment & Plan:   Dyspnea, unspecified type - Plan: Spirometry with Graph, DG Chest 2 View, CBC w/Diff  Centrilobular emphysema (HCC)  Chronic fatigue  OSA (obstructive sleep apnea)  Discussion: Sritha comes back to clinic today for evaluation of shortness of breath which she has noticed while climbing hills.  She says that this occurs primarily while exerting herself.  It is also associated with some fatigue and right-sided leg weakness.  She does have some early airflow obstruction and emphysema, so my first suspicion is that she has slightly worse airflow obstruction as she is aged and this is causing the sensation of shortness of breath.  However, she has gained weight.  Fatigue may also be contributing from obstructive sleep apnea.  She has not been evaluated for anemia recently so she needs a CBC.  We will get some objective data today by checking spirometry, chest x-ray and exertional oximetry.  We will give her a Combivent inhaler to take prior to exercise.  I have encouraged her to start using CPAP.  If she still having trouble when she comes back on the next visit then we may need to do an echocardiogram to look at her heart given her mitral valve repair performed several years ago, and we may want to look into the right leg weakness a bit more.  Plan: Worsening shortness of breath: We will check spirometry testing to look at airflow obstruction, check your oxygen while walking, and check a chest x-ray Take Combivent 1 puff prior to exercise next 2 weeks We will check a complete blood count to make sure you are not anemic We will see you back in a couple weeks to see how you are feeling with the Combivent  Recent diagnosis of sleep apnea with fatigue and  sleepiness: I recommend you try CPAP     Current Outpatient Medications:  .  loratadine (CLARITIN) 5 MG chewable tablet, Chew 5 mg by mouth as needed. Reported on 10/08/2015, Disp: , Rfl:  .  polyethylene glycol powder (GLYCOLAX/MIRALAX) powder, USE EVERY DAY AS NEEDED, Disp: 255 g, Rfl: 2 .  sodium chloride (OCEAN) 0.65 % nasal spray, Place 2 sprays into both nostrils every 4 (four) hours as needed for congestion. , Disp: , Rfl:  .  SYNTHROID 75 MCG tablet, TAKE 1 TABLET (75 MCG TOTAL) BY MOUTH DAILY BEFORE BREAKFAST., Disp: 90 tablet, Rfl: 1 .  terconazole (TERAZOL 3) 0.8 % vaginal cream, Place  1 applicator vaginally at bedtime. For 3 days., Disp: 20 g, Rfl: 0 .  Coenzyme Q10 (CO Q 10) 10 MG CAPS, Take by mouth. Reported on 10/08/2015, Disp: , Rfl:

## 2017-03-03 NOTE — Patient Instructions (Signed)
Worsening shortness of breath: We will check spirometry testing to look at airflow obstruction, check your oxygen while walking, and check a chest x-ray Take Combivent 1 puff prior to exercise next 2 weeks We will check a complete blood count to make sure you are not anemic We will see you back in a couple weeks to see how you are feeling with the Combivent  Recent diagnosis of sleep apnea with fatigue and sleepiness: I recommend you try CPAP

## 2017-03-25 ENCOUNTER — Ambulatory Visit: Payer: Medicare Other | Admitting: Pulmonary Disease

## 2017-04-01 ENCOUNTER — Ambulatory Visit (INDEPENDENT_AMBULATORY_CARE_PROVIDER_SITE_OTHER): Payer: Medicare Other | Admitting: Pulmonary Disease

## 2017-04-01 ENCOUNTER — Encounter: Payer: Self-pay | Admitting: Pulmonary Disease

## 2017-04-01 VITALS — BP 118/72 | HR 68 | Ht 60.0 in | Wt 124.6 lb

## 2017-04-01 DIAGNOSIS — J432 Centrilobular emphysema: Secondary | ICD-10-CM | POA: Diagnosis not present

## 2017-04-01 DIAGNOSIS — J341 Cyst and mucocele of nose and nasal sinus: Secondary | ICD-10-CM | POA: Insufficient documentation

## 2017-04-01 NOTE — Patient Instructions (Signed)
Shortness of breath: I recommend that she use Combivent 1 puff every 6 hours as needed for shortness of breath Make sure you use the medicine prior to exercise Try to exercise more over the next several weeks Come back and see me in 3 months or sooner if things worsen

## 2017-04-01 NOTE — Progress Notes (Signed)
Subjective:    Patient ID: Elaine Le, female    DOB: 15-Jul-1960, 56 y.o.   MRN: [id number]  Synopsis: Elaine Le was hospitalized for a severe exacerbation of COPD with pneumonia in early 2016. She established care with the [facility] afterwards.  She had endocarditis and had a valve replacement in 2016.  HPI Chief Complaint  Patient presents with  . Follow-up   Elaine Le says that she is still suffering with shortness of breath. She hasn't walked much and hasn't used the inhaler much.  She has actually only used it once.  She says that she has been too busy recently so she has not used it.  She has not walked her dog very much either.  She only feels short of breath when she climbs 2 flights of stairs.  Past Medical History:  Diagnosis Date  . Acute respiratory failure with hypercapnia ([place]) 06/21/2014  . Acute systolic heart failure ([place]) 09/2014  . Adenomatous colon polyp   . Arthritis   . Back pain   . Chronic diastolic heart failure (HCC)    related to MR/MS  . Chronic pain syndrome   . Chronic sinusitis   . CN (constipation) 05/25/2014  . Community acquired pneumonia   . COPD (chronic obstructive pulmonary disease)-PFT pending  07/17/2014  . Diverticulosis   . Fatigue   . Fibromyalgia   . GERD (gastroesophageal reflux disease)   . Headaches, cluster   . Hiatal hernia   . Hx of adenomatous colonic polyps 03/10/2011   Oct 2012, repeat colon Oct 2017   . IBS (irritable bowel syndrome) 01/21/2011  . Influenza A 06/29/2014  . Internal hemorrhoids   . Irritable bowel syndrome (IBS)   . Mitral valve regurgitation    severe  . Mitral valve stenosis, moderate 07/11/2014  . Muscle pain   . Muscular deconditioning 07/05/2014  . Night sweats   . Pleural effusion   . Pneumonia   . Protein-calorie malnutrition, severe ([place]) 07/06/2014  . 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 S/P minimally invasive  mitral valve replacement with bioprosthetic valve 11/08/2014   25 mm [facility] Mitral bovine bioprosthetic tissue valve placed via right mini thoracotomy approach  . Tobacco abuse      Review of Systems  Constitutional: Negative for chills, fatigue and fever.  HENT: Negative for postnasal drip, rhinorrhea and sinus pressure.   Respiratory: Positive for shortness of breath. Negative for cough and wheezing.   Cardiovascular: Negative for chest pain and leg swelling.       Objective:   Physical Exam Vitals:   04/01/17 1424  BP: 118/72  Pulse: 68  SpO2: 97%  Weight: 124 lb 9.6 oz (56.5 kg)  Height: 5' (1.524 m)  RA  Gen: well appearing HENT: OP clear, TM's clear, neck supple PULM: CTA B, normal percussion CV: RRR, no mgr, trace edema GI: BS+, soft, nontender Derm: no cyanosis or rash Psyche: normal mood and affect    CBC    Component Value Date/Time   WBC 6.6 03/03/2017 1102   RBC 4.92 03/03/2017 1102   HGB 14.7 03/03/2017 1102   HCT 43.5 03/03/2017 1102   PLT 253.0 03/03/2017 1102   MCV 88.4 03/03/2017 1102   MCH 28.6 11/14/2014 0445   MCHC 33.8 03/03/2017 1102   RDW 13.2 03/03/2017 1102   LYMPHSABS 1.4 03/03/2017 1102   MONOABS 0.4 03/03/2017 1102   EOSABS 0.1 03/03/2017 1102  BASOSABS 0.1 03/03/2017 1102   PFT: 08/23/2014 pulmonary function test ratio 73%, flow volume loop consistent with obstruction, FEV1 1.68 L (70% predicted, 9% change with bronchodilator), total lung capacity 4.26 L (94% predicted), DLCO 14.87, (76% protected)       Assessment & Plan:   Centrilobular emphysema ([place])  Discussion: Unfortunately Elaine Le has not really followed any of the instructions I gave her on the last visit.  Specifically, I want her to exercise more frequently and to use the Combivent.  If she still suffering with shortness of breath despite that then when we see her next we will need to consider either repeating lung function testing or a cardiac evaluation.   In general, I think that her dyspnea is secondary to airflow obstruction (mild) and deconditioning.  Plan: Shortness of breath: I recommend that she use Combivent 1 puff every 6 hours as needed for shortness of breath Make sure you use the medicine prior to exercise Try to exercise more over the next several weeks Come back and see me in 3 months or sooner if things worsen   Current Outpatient Medications:  .  Coenzyme Q10 (CO Q 10) 10 MG CAPS, Take by mouth. Reported on 10/08/2015, Disp: , Rfl:  .  Ipratropium-Albuterol (COMBIVENT RESPIMAT) 20-100 MCG/ACT AERS respimat, Inhale 1 puff every 6 (six) hours into the lungs., Disp: 1 Inhaler, Rfl: 5 .  levothyroxine (SYNTHROID, LEVOTHROID) 50 MCG tablet, Take 50 mcg by mouth daily before breakfast., Disp: , Rfl:  .  loratadine (CLARITIN) 5 MG chewable tablet, Chew 5 mg by mouth as needed. Reported on 10/08/2015, Disp: , Rfl:  .  polyethylene glycol powder (GLYCOLAX/MIRALAX) powder, USE EVERY DAY AS NEEDED, Disp: 255 g, Rfl: 2 .  sodium chloride (OCEAN) 0.65 % nasal spray, Place 2 sprays into both nostrils every 4 (four) hours as needed for congestion. , Disp: , Rfl:  .  terconazole (TERAZOL 3) 0.8 % vaginal cream, Place 1 applicator vaginally at bedtime. For 3 days., Disp: 20 g, Rfl: 0

## 2017-05-23 NOTE — Progress Notes (Signed)
Cardiology Office Note   Date:  05/25/2017   ID:  Elaine Le, DOB 1961/04/02, MRN [id number]  PCP:  Hoyt Koch, MD  Cardiologist:   Minus Breeding, MD   No chief complaint on file.     History of Present Illness: Elaine Le is a 57 y.o. female who presents for follow-up after mitral valve replacement. She had probable rheumatic disease with severe regurgitation and moderate stenosis and is now status post minimally invasive valve replacement.   She had a stable replacement with bioprosthesis on echo in May 2017.   She says that she has been having some increased shortness of breath.  She will notice this when she is going to walk her dog.  She is seeing Dr. Lake Bells and they were considering having her take an inhaler before she goes walking.  They want to make sure it is not cardiac.  She does get some sporadic chest pressure but this seems to be random.  It is not reproducible with exercise.  She is not describing any neck or arm discomfort.  She is not describing any new palpitations, presyncope or syncope.  She is had no weight gain or edema.   Past Medical History:  Diagnosis Date  . Acute respiratory failure with hypercapnia ([place]) 06/21/2014  . Acute systolic heart failure ([place]) 09/2014  . Adenomatous colon polyp   . Arthritis   . Back pain   . Chronic diastolic heart failure (H[facility])    related to MR/MS  . Chronic pain syndrome   . Chronic sinusitis   . CN (constipation) 05/25/2014  . Community acquired pneumonia   . COPD (chronic obstructive pulmonary disease)-PFT pending  07/17/2014  . Diverticulosis   . Fatigue   . Fibromyalgia   . GERD (gastroesophageal reflux disease)   . Headaches, cluster   . Hiatal hernia   . Hx of adenomatous colonic polyps 03/10/2011   Oct 2012, repeat colon Oct 2017   . IBS (irritable bowel syndrome) 01/21/2011  . Influenza A 06/29/2014  . Internal hemorrhoids   . Irritable bowel syndrome (IBS)   . Mitral valve  regurgitation    severe  . Mitral valve stenosis, moderate 07/11/2014  . Muscle pain   . Muscular deconditioning 07/05/2014  . Night sweats   . Pleural effusion   . Pneumonia   . Protein-calorie malnutrition, severe ([place]) 07/06/2014  . 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 S/P minimally invasive mitral valve replacement with bioprosthetic valve 11/08/2014   25 mm [facility] Mitral bovine bioprosthetic tissue valve placed via right mini thoracotomy approach  . Tobacco abuse     Past Surgical History:  Procedure Laterality Date  . CYSTOSTOMY W/ BLADDER DILATION     at age 48  . LEFT AND RIGHT HEART CATHETERIZATION WITH CORONARY ANGIOGRAM N/A 07/27/2014   Procedure: LEFT AND RIGHT HEART CATHETERIZATION WITH CORONARY ANGIOGRAM;  Surgeon: Burnell Blanks, MD;  Location: [facility] CATH LAB;  Right left heart catheterization: Mild/minimal coronary artery disease. RV: 50/7/17, PA: 48/29 (mean 38), PCWP 33 mmHg  . MITRAL VALVE REPAIR Right 11/08/2014   Procedure: MINIMALLY INVASIVE MITRAL VALVE REPLACEMENT(MVR);  Surgeon: Rexene Alberts, MD;  Location: [place];  Service: Open Heart Surgery;  Laterality: Right;  . NEUROPLASTY / TRANSPOSITION MEDIAN NERVE AT CARPAL TUNNEL BILATERAL    . TEE WITHOUT CARDIOVERSION N/A 07/10/2014   Procedure: TRANSESOPHAGEAL ECHOCARDIOGRAM (TEE);  Surgeon: Lelon Perla,  MD;  Location: [place];  Service: Cardiovascular;  55-60%. No regional wall motion modalities. Moderate mitral stenosis with severe regurgitation and moderate to severely dilated left atrium.  . TEE WITHOUT CARDIOVERSION N/A 11/08/2014   Procedure: TRANSESOPHAGEAL ECHOCARDIOGRAM (TEE);  Surgeon: Rexene Alberts, MD;  Location: [place];  Service: Open Heart Surgery;  Laterality: N/A;  . THORACIC OUTLET SURGERY    . TRANSTHORACIC ECHOCARDIOGRAM  07/04/2014   Normal LV Size/Fnx - EF 60-65% no RWMA; MV: thickened leaflets with doming,  fixed Posterior leaflet, anterior leaflet w/ large/shaggy & mobile density.  c/w Rheumatic Mitral disease - moderate MS & Mod-Severe MR.  . TUBAL LIGATION    . VULVA SURGERY       Current Outpatient Medications  Medication Sig Dispense Refill  . antiseptic oral rinse (BIOTENE) LIQD 15 mLs by Mouth Rinse route as needed for dry mouth.    . Ipratropium-Albuterol (COMBIVENT RESPIMAT) 20-100 MCG/ACT AERS respimat Inhale 1 puff every 6 (six) hours into the lungs. 1 Inhaler 5  . levothyroxine (SYNTHROID, LEVOTHROID) 50 MCG tablet Take 50 mcg by mouth daily before breakfast.    . liothyronine (CYTOMEL) 5 MCG tablet Take 1 tablet by mouth daily.  2  . sodium chloride (OCEAN) 0.65 % nasal spray Place 2 sprays into both nostrils every 4 (four) hours as needed for congestion.     Marland Kitchen terconazole (TERAZOL 3) 0.8 % vaginal cream Place 1 applicator vaginally at bedtime. For 3 days. 20 g 0   No current facility-administered medications for this visit.     Allergies:   Montelukast sodium; Morphine and related; Other; and Warfarin and related    ROS:  Please see the history of present illness.   Otherwise, review of systems are positive for none.   All other systems are reviewed and negative.    PHYSICAL EXAM: VS:  BP 108/62   Pulse 74   Ht 5' (1.524 m)   Wt 128 lb (58.1 kg)   BMI 25.00 kg/m  , BMI Body mass index is 25 kg/m.  GENERAL:  Well appearing NECK:  No jugular venous distention, waveform within normal limits, carotid upstroke brisk and symmetric, no bruits, no thyromegaly LUNGS:  Clear to auscultation bilaterally CHEST:  Unremarkable HEART:  PMI not displaced or sustained,S1 and S2 within normal limits, no S3, no S4, no clicks, no rubs, 2 out of 6 apical slightly holosystolic murmur, no diastolic murmurs ABD:  Flat, positive bowel sounds normal in frequency in pitch, no bruits, no rebound, no guarding, no midline pulsatile mass, no hepatomegaly, no splenomegaly EXT:  2 plus pulses  throughout, no edema, no cyanosis no clubbing   EKG:  EKG is  ordered today. Sinus rhythm, rate 74, axis within normal limits, intervals within normal limits, no acute ST-T wave changes.  Recent Labs: 11/09/2014: Magnesium 2.1 01/10/2015: ALT 27; BUN 9; Creatinine, Ser 0.70; Potassium 4.3; Sodium 139 02/01/2015: Hemoglobin 15.1*; Platelets 292.0 03/29/2015: TSH 4.03    Lipid Panel    Component Value Date/Time   TRIG 115 06/28/2014 1400      Wt Readings from Last 3 Encounters:  05/25/17 128 lb (58.1 kg)  04/01/17 124 lb 9.6 oz (56.5 kg)  03/03/17 123 lb (55.8 kg)      Other studies Reviewed: Additional studies/ records that were reviewed today include: Echo    ASSESSMENT AND PLAN:  MVR: The patient did have some mitral insufficiency noted with this 2 years ago.  Given this and her dyspnea will follow  up with an echocardiogram.    DYSPNEA: The patient will get a BNP level.  If this and the echo is normal and she continues to have symptoms without a clear pulmonary etiology I could consider treadmill testing although she is had minimal plaque in the past.   ATRIAL FIB: She is had no recurrence of this dysrhythmia.  DYSLIPIDEMIA:  With the KPN tool and now have access to her lipids.  Her LDL was 220.  She has been intolerant of statins.  I would suggest that she see Dr. Debara Pickett in our [facility] to discuss further therapy possibly with PCSK9.  She is agreeable to have this consultation.    Current medicines are reviewed at length with the patient today.  The patient does not have concerns regarding medicines.  The following changes have been made:  None  Labs/ tests ordered today include:  Echo BNP   Disposition:   FU with me in  12  months.    Signed, Minus Breeding, MD  05/25/2017 10:18 AM    [place]

## 2017-05-25 ENCOUNTER — Ambulatory Visit (INDEPENDENT_AMBULATORY_CARE_PROVIDER_SITE_OTHER): Payer: Medicare Other | Admitting: Cardiology

## 2017-05-25 ENCOUNTER — Encounter: Payer: Self-pay | Admitting: Cardiology

## 2017-05-25 VITALS — BP 108/62 | HR 74 | Ht 60.0 in | Wt 128.0 lb

## 2017-05-25 DIAGNOSIS — Z952 Presence of prosthetic heart valve: Secondary | ICD-10-CM | POA: Diagnosis not present

## 2017-05-25 DIAGNOSIS — R0602 Shortness of breath: Secondary | ICD-10-CM

## 2017-05-25 NOTE — Patient Instructions (Signed)
Medication Instructions:  Continue current medications  If you need a refill on your cardiac medications before your next appointment, please call your pharmacy.  Labwork: BNP Today HERE IN OUR OFFICE AT [facility]  Take the provided lab slips for you to take with you to the lab for you blood draw.   You will NOT need to fast   You may go to any [facility] lab that is convenient for you however, we do have a lab in our office that is able to assist you. You do NOT need an appointment for our lab. Once in our office lobby there is a podium to the right of the check-in desk where you are to sign-in and ring a doorbell to alert [place] you are here. Lab is open Monday-Friday from 8:00am to 4:00pm; and is closed for lunch from 12:45p-1:45pm   Testing/Procedures: Your physician has requested that you have an echocardiogram. Echocardiography is a painless test that uses sound waves to create images of your heart. It provides your doctor with information about the size and shape of your heart and how well your heart's chambers and valves are working. This procedure takes approximately one hour. There are no restrictions for this procedure.   Follow-Up: Your physician wants you to follow-up in: Lipid Clinic with Dr Debara Pickett.   Your physician wants you to follow-up in: 1 Year. You will receive a reminder letter in the mail two months in advance. If you don't receive a letter, please call our office to schedule the follow-up appointment.   Thank you for choosing [facility] at [facility]!!

## 2017-05-26 LAB — BRAIN NATRIURETIC PEPTIDE: BNP: 24.1 pg/mL (ref 0.0–100.0)

## 2017-05-29 ENCOUNTER — Other Ambulatory Visit (HOSPITAL_COMMUNITY): Payer: Medicare Other

## 2017-06-04 ENCOUNTER — Other Ambulatory Visit: Payer: Self-pay

## 2017-06-04 ENCOUNTER — Ambulatory Visit (HOSPITAL_COMMUNITY): Payer: Medicare Other | Attending: Cardiovascular Disease

## 2017-06-04 DIAGNOSIS — Z952 Presence of prosthetic heart valve: Secondary | ICD-10-CM | POA: Insufficient documentation

## 2017-06-29 ENCOUNTER — Ambulatory Visit: Payer: Medicare Other | Admitting: Internal Medicine

## 2017-08-06 ENCOUNTER — Other Ambulatory Visit: Payer: Self-pay | Admitting: Obstetrics & Gynecology

## 2017-08-06 ENCOUNTER — Other Ambulatory Visit: Payer: Self-pay

## 2017-08-06 DIAGNOSIS — Z1231 Encounter for screening mammogram for malignant neoplasm of breast: Secondary | ICD-10-CM

## 2017-08-25 ENCOUNTER — Encounter: Payer: Self-pay | Admitting: Internal Medicine

## 2017-08-25 ENCOUNTER — Ambulatory Visit (INDEPENDENT_AMBULATORY_CARE_PROVIDER_SITE_OTHER): Payer: Medicare Other | Admitting: Internal Medicine

## 2017-08-25 VITALS — BP 116/77 | HR 96 | Ht 60.0 in | Wt 130.0 lb

## 2017-08-25 DIAGNOSIS — E785 Hyperlipidemia, unspecified: Secondary | ICD-10-CM | POA: Insufficient documentation

## 2017-08-25 DIAGNOSIS — R0602 Shortness of breath: Secondary | ICD-10-CM

## 2017-08-25 DIAGNOSIS — Z952 Presence of prosthetic heart valve: Secondary | ICD-10-CM

## 2017-08-25 DIAGNOSIS — E7801 Familial hypercholesterolemia: Secondary | ICD-10-CM

## 2017-08-25 DIAGNOSIS — I2510 Atherosclerotic heart disease of native coronary artery without angina pectoris: Secondary | ICD-10-CM

## 2017-08-25 NOTE — Progress Notes (Signed)
OFFICE NOTE  Chief Complaint:  evaluate dyslipidemia, statin intolerance  Primary Care Physician: Hoyt Koch, MD  HPI:  Elaine Le is a 57 y.o. female with a past medial history significant for lipidemia and statin intolerance.  She is a patient of Dr. Percival Spanish.  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  At the time she had mild nonobstructive coronary artery disease.  Her family history is significant for heart disease, particularly in her mother and her grandmother.  Both of which were noted to have elevated cholesterol.  Recently she had a cholesterol profile in July 2018.  Total cholesterol was 291, HDL 72, LDL 203 and triglycerides 85.  As she has had significant intolerance of all statins, she was referred for evaluation of possible PCSK9 therapy.  So reports evolving hypothyroidism after her surgery, but reports its finally regulated.  PMHx:  Past Medical History:  Diagnosis Date  . Acute respiratory failure with hypercapnia ([place]) 06/21/2014  . Acute systolic heart failure ([place]) 09/2014  . Adenomatous colon polyp   . Arthritis   . Back pain   . Chronic diastolic heart failure (HCC)    related to MR/MS  . Chronic pain syndrome   . Chronic sinusitis   . CN (constipation) 05/25/2014  . Community acquired pneumonia   . COPD (chronic obstructive pulmonary disease)-PFT pending  07/17/2014  . Diverticulosis   . Fatigue   . Fibromyalgia   . GERD (gastroesophageal reflux disease)   . Headaches, cluster   . Hiatal hernia   . Hx of adenomatous colonic polyps 03/10/2011   Oct 2012, repeat colon Oct 2017   . IBS (irritable bowel syndrome) 01/21/2011  . Influenza A 06/29/2014  . Internal hemorrhoids   . Irritable bowel syndrome (IBS)   . Mitral valve regurgitation    severe  . Mitral valve stenosis, moderate 07/11/2014  . Muscle pain   .  Muscular deconditioning 07/05/2014  . Night sweats   . Pleural effusion   . Pneumonia   . Protein-calorie malnutrition, severe ([place]) 07/06/2014  . 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 S/P minimally invasive mitral valve replacement with bioprosthetic valve 11/08/2014   25 mm [facility] Mitral bovine bioprosthetic tissue valve placed via right mini thoracotomy approach  . Tobacco abuse     Past Surgical History:  Procedure Laterality Date  . CYSTOSTOMY W/ BLADDER DILATION     at age 45  . LEFT AND RIGHT HEART CATHETERIZATION WITH CORONARY ANGIOGRAM N/A 07/27/2014   Procedure: LEFT AND RIGHT HEART CATHETERIZATION WITH CORONARY ANGIOGRAM;  Surgeon: Burnell Blanks, MD;  Location: [facility] CATH LAB;  Right left heart catheterization: Mild/minimal coronary artery disease. RV: 50/7/17, PA: 48/29 (mean 38), PCWP 33 mmHg  . MITRAL VALVE REPAIR Right 11/08/2014   Procedure: MINIMALLY INVASIVE MITRAL VALVE REPLACEMENT(MVR);  Surgeon: Rexene Alberts, MD;  Location: [place];  Service: Open Heart Surgery;  Laterality: Right;  . NEUROPLASTY / TRANSPOSITION MEDIAN NERVE AT CARPAL TUNNEL BILATERAL    . TEE WITHOUT CARDIOVERSION N/A 07/10/2014   Procedure: TRANSESOPHAGEAL ECHOCARDIOGRAM (TEE);  Surgeon: Lelon Perla, MD;  Location: [facility];  Service: Cardiovascular;  55-60%. No regional wall motion modalities. Moderate mitral stenosis with severe regurgitation and moderate to severely dilated left atrium.  . TEE WITHOUT CARDIOVERSION N/A 11/08/2014   Procedure: TRANSESOPHAGEAL ECHOCARDIOGRAM (  TEE);  Surgeon: Rexene Alberts, MD;  Location: [place];  Service: Open Heart Surgery;  Laterality: N/A;  . THORACIC OUTLET SURGERY    . TRANSTHORACIC ECHOCARDIOGRAM  07/04/2014   Normal LV Size/Fnx - EF 60-65% no RWMA; MV: thickened leaflets with doming, fixed Posterior leaflet, anterior leaflet w/ large/shaggy & mobile density.  c/w Rheumatic  Mitral disease - moderate MS & Mod-Severe MR.  . TUBAL LIGATION    . VULVA SURGERY      FAMHx:  Family History  Problem Relation Age of Onset  . Kidney cancer Mother   . Colon polyps Mother   . Irritable bowel syndrome Mother   . Diabetes Sister   . Liver disease Sister   . Irritable bowel syndrome Daughter   . Diabetes Brother   . Colon cancer Neg Hx   . Thyroid disease Neg Hx     SOCHx:   reports that she quit smoking about 3 years ago. Her smoking use included cigarettes. She has a 17.50 pack-year smoking history. She has never used smokeless tobacco. She reports that she does not drink alcohol or use drugs.  ALLERGIES:  Allergies  Allergen Reactions  . Montelukast Sodium     Intolerance - effects her mood   . Morphine And Related Other (See Comments)    Altered mental state Altered mental state  . Other Other (See Comments)    Any antidepressants per pt- causes altered mental state, anger  . Warfarin And Related     ROS: Pertinent items noted in HPI and remainder of comprehensive ROS otherwise negative.  HOME MEDS: Current Outpatient Medications on File Prior to Visit  Medication Sig Dispense Refill  . Biotin 800 MCG TABS Take 1 tablet by mouth daily.    . Ipratropium-Albuterol (COMBIVENT RESPIMAT) 20-100 MCG/ACT AERS respimat Inhale 1 puff every 6 (six) hours into the lungs. 1 Inhaler 5  . levothyroxine (SYNTHROID, LEVOTHROID) 50 MCG tablet Take 50 mcg by mouth daily before breakfast.    . liothyronine (CYTOMEL) 5 MCG tablet Take 1 tablet by mouth daily.  2  . sodium chloride (OCEAN) 0.65 % nasal spray Place 2 sprays into both nostrils every 4 (four) hours as needed for congestion.     Marland Kitchen terconazole (TERAZOL 3) 0.8 % vaginal cream Place 1 applicator vaginally at bedtime. For 3 days. 20 g 0  . [DISCONTINUED] pantoprazole (PROTONIX) 40 MG tablet Take 1 tablet (40 mg total) by mouth daily. 30 tablet 11   No current facility-administered medications on file prior to  visit.     LABS/IMAGING: No results found for this or any previous visit (from the past 48 hour(s)). No results found.  LIPID PANEL:    Component Value Date/Time   TRIG 115 06/28/2014 1400     WEIGHTS: Wt Readings from Last 3 Encounters:  08/25/17 130 lb (59 kg)  05/25/17 128 lb (58.1 kg)  04/01/17 124 lb 9.6 oz (56.5 kg)    VITALS: BP 116/77   Pulse 96   Ht 5' (1.524 m)   Wt 130 lb (59 kg)   SpO2 97%   BMI 25.39 kg/m   EXAM: General appearance: alert and no distress Neck: no carotid bruit, no JVD and thyroid not enlarged, symmetric, no tenderness/mass/nodules Lungs: clear to auscultation bilaterally Heart: regular rate and rhythm Abdomen: soft, non-tender; bowel sounds normal; no masses,  no organomegaly Extremities: extremities normal, atraumatic, no cyanosis or edema and No tendon xanthomas Pulses: 2+ and symmetric Skin: Skin color, texture, turgor  normal. No rashes or lesions Neurologic: Grossly normal Psych: Pleasant  EKG: Deferred  ASSESSMENT: 1. Dyslipidemia-statin intolerant 2. Goal LDL less than 70 (mild coronary artery disease, prior mitral valve replacement) 3. History of mitral valve disease status post bioprosthetic MVR in 2016 4. Family history of premature onset coronary disease.  PLAN: 1.   Mrs. Dewitt has marked dyslipidemia.  She has been intolerant to statins and her LDL cholesterol is 203.  She does not eat a terribly atherogenic diet, which is concerning for probable heterozygous familial hypercholesterolemia.  There is a strong family lineage both in her mother and grandmother of heart disease and elevated cholesterol.  She also has a brother and a sister as well as 1 daughter, none of which she is aware have been tested for their cholesterol levels.  There may be advantage to genetic testing we discussed that today further.  I do think she is a good candidate for PCSK9 inhibitor therapy, however she does not want to proceed right away.  She  wants to see if there is any improvement in her cholesterol recently from some natural and herbal supplements she has been using.  We will go ahead and repeat a lipid profile, but if it remains elevated significantly (which I suspect it will be), would strongly suggest we investigate starting a PCSK9 inhibitor.  Thanks again for the kind referral.  Pixie Casino, MD, FACC, [place] Director of the [facility]  [facility] Diplomate of the American Board of Clinical Lipidology Attending Cardiologist  Direct Dial: [phone]  Fax: [phone]  Website:  [email or web address] 08/25/2017, 9:44 PM

## 2017-08-25 NOTE — Patient Instructions (Signed)
Your physician recommends that you return for lab work FASTING to check cholesterol   Your physician recommends that you schedule a follow-up appointment in [place] with Dr. Debara Pickett

## 2017-08-27 LAB — NMR, LIPOPROFILE
CHOLESTEROL, TOTAL: 241 mg/dL — AB (ref 100–199)
HDL Particle Number: 33.4 umol/L (ref >=30.5)
HDL-C: 59 mg/dL (ref >39)
LDL PARTICLE NUMBER: 1968 nmol/L — AB (ref <1000)
LDL Size: 21.1 nm (ref >20.5)
LDL-C: 157 mg/dL — AB (ref 0–99)
LP-IR Score: 41 (ref <=45)
Small LDL Particle Number: 800 nmol/L — ABNORMAL HIGH (ref <=527)
TRIGLYCERIDES: 126 mg/dL (ref 0–149)

## 2017-08-31 ENCOUNTER — Other Ambulatory Visit: Payer: Self-pay | Admitting: Obstetrics & Gynecology

## 2017-08-31 ENCOUNTER — Telehealth: Payer: Self-pay | Admitting: Internal Medicine

## 2017-08-31 DIAGNOSIS — N644 Mastodynia: Secondary | ICD-10-CM

## 2017-08-31 NOTE — Telephone Encounter (Signed)
New Message:    Pt would like her lab results from 08-26-17 please.

## 2017-08-31 NOTE — Telephone Encounter (Signed)
Advised Pt that Dr. Alain Honey would need to review and interpret results and once he reviews them his nurse would call with results. Pt verbalized understanding.

## 2017-09-03 ENCOUNTER — Ambulatory Visit
Admission: RE | Admit: 2017-09-03 | Discharge: 2017-09-03 | Disposition: A | Discharge status: Home / Self Care | Payer: Medicare Other | Source: Ambulatory Visit | Attending: Obstetrics & Gynecology | Admitting: Obstetrics & Gynecology

## 2017-09-03 ENCOUNTER — Ambulatory Visit: Payer: Medicare Other

## 2017-09-03 DIAGNOSIS — N644 Mastodynia: Secondary | ICD-10-CM

## 2017-09-24 ENCOUNTER — Encounter: Payer: Self-pay | Admitting: Internal Medicine

## 2017-09-24 ENCOUNTER — Ambulatory Visit (INDEPENDENT_AMBULATORY_CARE_PROVIDER_SITE_OTHER): Payer: Medicare Other | Admitting: Internal Medicine

## 2017-09-24 VITALS — BP 142/76 | HR 75 | Ht 63.0 in | Wt 130.0 lb

## 2017-09-24 DIAGNOSIS — E7801 Familial hypercholesterolemia: Secondary | ICD-10-CM | POA: Insufficient documentation

## 2017-09-24 DIAGNOSIS — Z952 Presence of prosthetic heart valve: Secondary | ICD-10-CM

## 2017-09-24 DIAGNOSIS — E782 Mixed hyperlipidemia: Secondary | ICD-10-CM

## 2017-09-24 DIAGNOSIS — I2510 Atherosclerotic heart disease of native coronary artery without angina pectoris: Secondary | ICD-10-CM | POA: Diagnosis not present

## 2017-09-24 NOTE — Patient Instructions (Addendum)
Your physician recommends that you continue on your current medications as directed. Please refer to the Current Medication list given to you today.  Your physician recommends that you return for lab work FASTING in 4 months to recheck cholesterol   Your physician recommends that you schedule a follow-up appointment in 4 months with Dr. Debara Pickett

## 2017-09-24 NOTE — Progress Notes (Signed)
OFFICE NOTE  Chief Complaint:  Follow-up dyslipidemia  Primary Care Physician: Hoyt Koch, MD  HPI:  Elaine Le is a 57 y.o. female with a past medial history significant for lipidemia and statin intolerance.  She is a patient of Dr. Percival Spanish.  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  At the time she had mild nonobstructive coronary artery disease.  Her family history is significant for heart disease, particularly in her mother and her grandmother.  Both of which were noted to have elevated cholesterol.  Recently she had a cholesterol profile in July 2018.  Total cholesterol was 291, HDL 72, LDL 203 and triglycerides 85.  As she has had significant intolerance of all statins, she was referred for evaluation of possible PCSK9 therapy.  So reports evolving hypothyroidism after her surgery, but reports its finally regulated.  09/24/2017  [name] is seen today in follow-up.  She has made significant dietary changes.  Her lipoprotein NMR demonstrated an LDL-C1 57, LDL-P of 1968, HDL 59 and triglycerides 126.  Small LDL particle number was elevated 800.  Although her HDL is elevated the LDL particle numbers indicates she is at significant risk.  Her goal LDL-P should be less than 1000 given her pre-existing coronary disease.  We discussed starting PCSK9 inhibitor therapy, but she is reluctant at this point.  She wants to continue to work on dietary changes and she said great benefits.  I told her that she actually probably has maximized her dietary benefit, as typically patients who even eats no cholesterol in her diet are unlikely to achieve more than 25% reduction in total cholesterol and she theoretically needs at least 50% reduction in her total cholesterol from where she is currently at.  Based on that she will not achieve her cholesterol goals with  diet alone.  PMHx:  Past Medical History:  Diagnosis Date  . Acute respiratory failure with hypercapnia ([place]) 06/21/2014  . Acute systolic heart failure ([place]) 09/2014  . Adenomatous colon polyp   . Arthritis   . Back pain   . Chronic diastolic heart failure (HCC)    related to MR/MS  . Chronic pain syndrome   . Chronic sinusitis   . CN (constipation) 05/25/2014  . Community acquired pneumonia   . COPD (chronic obstructive pulmonary disease)-PFT pending  07/17/2014  . Diverticulosis   . Fatigue   . Fibromyalgia   . GERD (gastroesophageal reflux disease)   . Headaches, cluster   . Hiatal hernia   . Hx of adenomatous colonic polyps 03/10/2011   Oct 2012, repeat colon Oct 2017   . IBS (irritable bowel syndrome) 01/21/2011  . Influenza A 06/29/2014  . Internal hemorrhoids   . Irritable bowel syndrome (IBS)   . Mitral valve regurgitation    severe  . Mitral valve stenosis, moderate 07/11/2014  . Muscle pain   . Muscular deconditioning 07/05/2014  . Night sweats   . Pleural effusion   . Pneumonia   . Protein-calorie malnutrition, severe ([place]) 07/06/2014  . 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 S/P minimally invasive mitral valve replacement with bioprosthetic valve 11/08/2014   25 mm [facility] Mitral bovine bioprosthetic tissue valve placed via right mini thoracotomy approach  . Tobacco abuse     Past Surgical History:  Procedure Laterality Date  . CYSTOSTOMY W/  BLADDER DILATION     at age 59  . LEFT AND RIGHT HEART CATHETERIZATION WITH CORONARY ANGIOGRAM N/A 07/27/2014   Procedure: LEFT AND RIGHT HEART CATHETERIZATION WITH CORONARY ANGIOGRAM;  Surgeon: Burnell Blanks, MD;  Location[facility] CATH LAB;  Right left heart catheterization: Mild/minimal coronary artery disease. RV: 50/7/17, PA: 48/29 (mean 38), PCWP 33 mmHg  . MITRAL VALVE REPAIR Right 11/08/2014   Procedure: MINIMALLY INVASIVE MITRAL VALVE  REPLACEMENT(MVR);  Surgeon: Rexene Alberts, MD;  Location: [place];  Service: Open Heart Surgery;  Laterality: Right;  . NEUROPLASTY / TRANSPOSITION MEDIAN NERVE AT CARPAL TUNNEL BILATERAL    . TEE WITHOUT CARDIOVERSION N/A 07/10/2014   Procedure: TRANSESOPHAGEAL ECHOCARDIOGRAM (TEE);  Surgeon: Lelon Perla, MD;  Location: [facility] ENDOSCOPY;  Service: Cardiovascular;  55-60%. No regional wall motion modalities. Moderate mitral stenosis with severe regurgitation and moderate to severely dilated left atrium.  . TEE WITHOUT CARDIOVERSION N/A 11/08/2014   Procedure: TRANSESOPHAGEAL ECHOCARDIOGRAM (TEE);  Surgeon: Rexene Alberts, MD;  Location: [place];  Service: Open Heart Surgery;  Laterality: N/A;  . THORACIC OUTLET SURGERY    . TRANSTHORACIC ECHOCARDIOGRAM  07/04/2014   Normal LV Size/Fnx - EF 60-65% no RWMA; MV: thickened leaflets with doming, fixed Posterior leaflet, anterior leaflet w/ large/shaggy & mobile density.  c/w Rheumatic Mitral disease - moderate MS & Mod-Severe MR.  . TUBAL LIGATION    . VULVA SURGERY      FAMHx:  Family History  Problem Relation Age of Onset  . Kidney cancer Mother   . Colon polyps Mother   . Irritable bowel syndrome Mother   . Diabetes Sister   . Liver disease Sister   . Irritable bowel syndrome Daughter   . Diabetes Brother   . Colon cancer Neg Hx   . Thyroid disease Neg Hx     SOCHx:   reports that she quit smoking about 3 years ago. Her smoking use included cigarettes. She has a 17.50 pack-year smoking history. She has never used smokeless tobacco. She reports that she does not drink alcohol or use drugs.  ALLERGIES:  Allergies  Allergen Reactions  . Montelukast Sodium     Intolerance - effects her mood   . Morphine And Related Other (See Comments)    Altered mental state Altered mental state  . Other Other (See Comments)    Any antidepressants per pt- causes altered mental state, anger  . Warfarin And Related     ROS: Pertinent items noted in  HPI and remainder of comprehensive ROS otherwise negative.  HOME MEDS: Current Outpatient Medications on File Prior to Visit  Medication Sig Dispense Refill  . Biotin 800 MCG TABS Take 1 tablet by mouth daily.    . Ipratropium-Albuterol (COMBIVENT RESPIMAT) 20-100 MCG/ACT AERS respimat Inhale 1 puff every 6 (six) hours into the lungs. 1 Inhaler 5  . levothyroxine (SYNTHROID, LEVOTHROID) 50 MCG tablet Take 50 mcg by mouth daily before breakfast.    . liothyronine (CYTOMEL) 5 MCG tablet Take 1 tablet by mouth daily.  2  . sodium chloride (OCEAN) 0.65 % nasal spray Place 2 sprays into both nostrils every 4 (four) hours as needed for congestion.     Marland Kitchen terconazole (TERAZOL 3) 0.8 % vaginal cream Place 1 applicator vaginally at bedtime. For 3 days. 20 g 0  . [DISCONTINUED] pantoprazole (PROTONIX) 40 MG tablet Take 1 tablet (40 mg total) by mouth daily. 30 tablet 11   No current facility-administered medications on file prior to  visit.     LABS/IMAGING: No results found for this or any previous visit (from the past 48 hour(s)). No results found.  LIPID PANEL:    Component Value Date/Time   TRIG 115 06/28/2014 1400     WEIGHTS: Wt Readings from Last 3 Encounters:  09/24/17 130 lb (59 kg)  08/25/17 130 lb (59 kg)  05/25/17 128 lb (58.1 kg)    VITALS: BP (!) 142/76   Pulse 75   Ht 5\' 3"  (1.6 m)   Wt 130 lb (59 kg)   SpO2 100%   BMI 23.03 kg/m   EXAM: Deferred  EKG: Deferred  ASSESSMENT: 1. Dyslipidemia-statin intolerant 2. Goal LDL less than 70 (mild coronary artery disease, prior mitral valve replacement) 3. History of mitral valve disease status post bioprosthetic MVR in 2016 4. Family history of premature onset coronary disease.  PLAN: 1.   Mrs. Cashatt has made improvement in her cholesterol profile with diet.  Her goal LDL still less than 70 with a LDL-P less than 1000.  This is not likely to be achieved with diet alone.  I have offered a PCSK9 inhibitor which  would likely get her to goal however she is reluctant to pursue that at this time.  We will provide her some literature on that as well as some more dietary information.  She like to repeat lipid NMR in 4 months and we will then compare the 2 studies and further discuss proceeding with therapy or not.  Follow-up with me then.  Pixie Casino, MD, [facility], [place] Director of the Advanced Lipid Disorders &  Cardiovascular Risk Reduction Clinic Diplomate of the American Board of Clinical Lipidology Attending Cardiologist  Direct Dial: [phone]  Fax: [phone]  Website:  [email or web address] 09/24/2017, 9:14 AM

## 2017-09-24 NOTE — Addendum Note (Signed)
Addended by: Fidel Levy on: 09/24/2017 09:31 AM   Modules accepted: Orders

## 2018-01-16 LAB — NMR, LIPOPROFILE
Cholesterol, Total: 212 mg/dL — ABNORMAL HIGH (ref 100–199)
HDL Particle Number: 31.7 umol/L (ref >=30.5)
HDL-C: 59 mg/dL (ref >39)
LDL PARTICLE NUMBER: 1613 nmol/L — AB (ref <1000)
LDL SIZE: 20.9 nm (ref >20.5)
LDL-C: 132 mg/dL — ABNORMAL HIGH (ref 0–99)
LP-IR SCORE: 34 (ref <=45)
Small LDL Particle Number: 665 nmol/L — ABNORMAL HIGH (ref <=527)
Triglycerides: 106 mg/dL (ref 0–149)

## 2018-01-19 ENCOUNTER — Telehealth: Payer: Self-pay | Admitting: Internal Medicine

## 2018-01-19 NOTE — Telephone Encounter (Signed)
New  Message       Patient is calling for lab results, pls call and advise

## 2018-01-19 NOTE — Telephone Encounter (Signed)
Returned call to patient advised lab results not available.Advised I will send message to Dr.Hilty's RN.

## 2018-01-20 NOTE — Telephone Encounter (Signed)
Needs PCSK9 inhibitor if she is agreeable.  Dr. Lemmie Evens

## 2018-01-21 NOTE — Telephone Encounter (Signed)
In result notes to be called

## 2018-01-22 ENCOUNTER — Encounter: Payer: Self-pay | Admitting: Internal Medicine

## 2018-01-22 ENCOUNTER — Ambulatory Visit: Payer: Medicare Other | Admitting: Internal Medicine

## 2018-01-28 ENCOUNTER — Encounter: Payer: Self-pay | Admitting: Internal Medicine

## 2018-01-28 ENCOUNTER — Ambulatory Visit (INDEPENDENT_AMBULATORY_CARE_PROVIDER_SITE_OTHER): Payer: Medicare Other | Admitting: Internal Medicine

## 2018-01-28 VITALS — BP 127/76 | HR 77 | Ht 60.0 in | Wt 127.6 lb

## 2018-01-28 DIAGNOSIS — Z789 Other specified health status: Secondary | ICD-10-CM | POA: Diagnosis not present

## 2018-01-28 DIAGNOSIS — E782 Mixed hyperlipidemia: Secondary | ICD-10-CM | POA: Diagnosis not present

## 2018-01-28 DIAGNOSIS — I2510 Atherosclerotic heart disease of native coronary artery without angina pectoris: Secondary | ICD-10-CM | POA: Diagnosis not present

## 2018-01-28 DIAGNOSIS — Z952 Presence of prosthetic heart valve: Secondary | ICD-10-CM | POA: Diagnosis not present

## 2018-01-28 NOTE — Patient Instructions (Signed)
Dr. Debara Pickett recommends red yeast rice. Please take as directed.   Your physician wants you to follow-up in: 6 months with Dr. Debara Pickett with fasting lab work prior to recheck cholesterol. You will receive a reminder letter in the mail two months in advance. If you don't receive a letter, please call our office to schedule the follow-up appointment.

## 2018-01-28 NOTE — Progress Notes (Signed)
OFFICE NOTE  Chief Complaint:  Follow-up dyslipidemia  Primary Care Physician: Hoyt Koch, MD  HPI:  Elaine Le is a 57 y.o. female with a past medial history significant for lipidemia and statin intolerance.  She is a patient of Dr. Percival Spanish.  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  At the time she had mild nonobstructive coronary artery disease.  Her family history is significant for heart disease, particularly in her mother and her grandmother.  Both of which were noted to have elevated cholesterol.  Recently she had a cholesterol profile in July 2018.  Total cholesterol was 291, HDL 72, LDL 203 and triglycerides 85.  As she has had significant intolerance of all statins, she was referred for evaluation of possible PCSK9 therapy.  So reports evolving hypothyroidism after her surgery, but reports its finally regulated.  09/24/2017  Mrs. Luckenbaugh is seen today in follow-up.  She has made significant dietary changes.  Her lipoprotein NMR demonstrated an LDL-C1 57, LDL-P of 1968, HDL 59 and triglycerides 126.  Small LDL particle number was elevated 800.  Although her HDL is elevated the LDL particle numbers indicates she is at significant risk.  Her goal LDL-P should be less than 1000 given her pre-existing coronary disease.  We discussed starting PCSK9 inhibitor therapy, but she is reluctant at this point.  She wants to continue to work on dietary changes and she said great benefits.  I told her that she actually probably has maximized her dietary benefit, as typically patients who even eats no cholesterol in her diet are unlikely to achieve more than 25% reduction in total cholesterol and she theoretically needs at least 50% reduction in her total cholesterol from where she is currently at.  Based on that she will not achieve her cholesterol goals with  diet alone.  01/28/2018  Mrs. Myers seen today in follow-up.  Please reports she is had some improvement in her lipid profile with dietary modifications.  Her LDL-P is decreased from [phone].  LDL-C is down as well from 157-132.  That being said, she is well above goal LDL less than 70 given her history of coronary artery disease.  She is unlikely to reach that target as I previously mentioned with diet alone.  She is very hesitant to take any therapy including statins or PCSK9.  We discussed some other options including possibly a natural therapy such as red yeast rice.  She did say that she would consider taking that in addition to diet and we might see 5 to 10% reduction in cholesterol.  Again this is suboptimal however the only therapy that she is agreeable at this time.  PMHx:  Past Medical History:  Diagnosis Date  . Acute respiratory failure with hypercapnia ([place]) 06/21/2014  . Acute systolic heart failure ([place]) 09/2014  . Adenomatous colon polyp   . Arthritis   . Back pain   . Chronic diastolic heart failure (HCC)    related to MR/MS  . Chronic pain syndrome   . Chronic sinusitis   . CN (constipation) 05/25/2014  . Community acquired pneumonia   . COPD (chronic obstructive pulmonary disease)-PFT pending  07/17/2014  . Diverticulosis   . Fatigue   . Fibromyalgia   . GERD (gastroesophageal reflux disease)   . Headaches, cluster   . Hiatal hernia   . Hx of adenomatous colonic polyps 03/10/2011  Oct 2012, repeat colon Oct 2017   . IBS (irritable bowel syndrome) 01/21/2011  . Influenza A 06/29/2014  . Internal hemorrhoids   . Irritable bowel syndrome (IBS)   . Mitral valve regurgitation    severe  . Mitral valve stenosis, moderate 07/11/2014  . Muscle pain   . Muscular deconditioning 07/05/2014  . Night sweats   . Pleural effusion   . Pneumonia   . Protein-calorie malnutrition, severe ([place]) 07/06/2014  . 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/10/2014   Severe mitral  regurgitation with moderate mitral stenosis  . S/P minimally invasive mitral valve replacement with bioprosthetic valve 11/08/2014   25 mm [facility] Mitral bovine bioprosthetic tissue valve placed via right mini thoracotomy approach  . Tobacco abuse     Past Surgical History:  Procedure Laterality Date  . CYSTOSTOMY W/ BLADDER DILATION     at age 56  . LEFT AND RIGHT HEART CATHETERIZATION WITH CORONARY ANGIOGRAM N/A 07/27/2014   Procedure: LEFT AND RIGHT HEART CATHETERIZATION WITH CORONARY ANGIOGRAM;  Surgeon: Burnell Blanks, MD;  Location: [facility] CATH LAB;  Right left heart catheterization: Mild/minimal coronary artery disease. RV: 50/7/17, PA: 48/29 (mean 38), PCWP 33 mmHg  . MITRAL VALVE REPAIR Right 11/08/2014   Procedure: MINIMALLY INVASIVE MITRAL VALVE REPLACEMENT(MVR);  Surgeon: Rexene Alberts, MD;  Location: [place];  Service: Open Heart Surgery;  Laterality: Right;  . NEUROPLASTY / TRANSPOSITION MEDIAN NERVE AT CARPAL TUNNEL BILATERAL    . TEE WITHOUT CARDIOVERSION N/A 07/10/2014   Procedure: TRANSESOPHAGEAL ECHOCARDIOGRAM (TEE);  Surgeon: Lelon Perla, MD;  Location: [facility] ENDOSCOPY;  Service: Cardiovascular;  55-60%. No regional wall motion modalities. Moderate mitral stenosis with severe regurgitation and moderate to severely dilated left atrium.  . TEE WITHOUT CARDIOVERSION N/A 11/08/2014   Procedure: TRANSESOPHAGEAL ECHOCARDIOGRAM (TEE);  Surgeon: Rexene Alberts, MD;  Location: [place];  Service: Open Heart Surgery;  Laterality: N/A;  . THORACIC OUTLET SURGERY    . TRANSTHORACIC ECHOCARDIOGRAM  07/04/2014   Normal LV Size/Fnx - EF 60-65% no RWMA; MV: thickened leaflets with doming, fixed Posterior leaflet, anterior leaflet w/ large/shaggy & mobile density.  c/w Rheumatic Mitral disease - moderate MS & Mod-Severe MR.  . TUBAL LIGATION    . VULVA SURGERY      FAMHx:  Family History  Problem Relation Age of Onset  . Kidney cancer Mother   . Colon polyps Mother   . Irritable  bowel syndrome Mother   . Diabetes Sister   . Liver disease Sister   . Irritable bowel syndrome Daughter   . Diabetes Brother   . Colon cancer Neg Hx   . Thyroid disease Neg Hx     SOCHx:   reports that she quit smoking about 3 years ago. Her smoking use included cigarettes. She has a 17.50 pack-year smoking history. She has never used smokeless tobacco. She reports that she does not drink alcohol or use drugs.  ALLERGIES:  Allergies  Allergen Reactions  . Montelukast Sodium     Intolerance - effects her mood   . Morphine And Related Other (See Comments)    Altered mental state Altered mental state  . Other Other (See Comments)    Any antidepressants per pt- causes altered mental state, anger  . Warfarin And Related     ROS: Pertinent items noted in HPI and remainder of comprehensive ROS otherwise negative.  HOME MEDS: Current Outpatient Medications on File Prior to Visit  Medication Sig Dispense Refill  . Biotin  800 MCG TABS Take 1 tablet by mouth daily.    Marland Kitchen levothyroxine (SYNTHROID, LEVOTHROID) 50 MCG tablet Take 50 mcg by mouth daily before breakfast.    . liothyronine (CYTOMEL) 5 MCG tablet Take 1 tablet by mouth daily.  2  . sodium chloride (OCEAN) 0.65 % nasal spray Place 2 sprays into both nostrils every 4 (four) hours as needed for congestion.     Marland Kitchen terconazole (TERAZOL 3) 0.8 % vaginal cream Place 1 applicator vaginally at bedtime. For 3 days. 20 g 0  . [DISCONTINUED] pantoprazole (PROTONIX) 40 MG tablet Take 1 tablet (40 mg total) by mouth daily. 30 tablet 11   No current facility-administered medications on file prior to visit.     LABS/IMAGING: No results found for this or any previous visit (from the past 48 hour(s)). No results found.  LIPID PANEL:    Component Value Date/Time   TRIG 115 06/28/2014 1400     WEIGHTS: Wt Readings from Last 3 Encounters:  01/28/18 127 lb 9.6 oz (57.9 kg)  09/24/17 130 lb (59 kg)  08/25/17 130 lb (59 kg)     VITALS: BP 127/76   Pulse 77   Ht 5' (1.524 m)   Wt 127 lb 9.6 oz (57.9 kg)   BMI 24.92 kg/m   EXAM: Deferred  EKG: Deferred  ASSESSMENT: 1. Dyslipidemia-statin intolerant 2. Goal LDL less than 70 (mild coronary artery disease, prior mitral valve replacement) 3. History of mitral valve disease status post bioprosthetic MVR in 2016 4. Family history of premature onset coronary disease.  PLAN: 1.   Mrs. Standifer is reluctant to go on PCSK9 inhibitor therapy and has been statin intolerant.  Her goal LDL is less than 70 and she is not achieve that with diet alone.  There has been about 3 pound weight loss with dietary changes.  I did commend her on this.  She said she would consider taking red yeast rice.  I suggested that she try the [facility] product in addition to her dietary changes.  We will repeat a lipid profile in about 6 months.  Should she reconsider PCSK9 inhibitor therapy I would strongly suggested and support it.  Pixie Casino, MD, [facility], [place] Director of the Advanced Lipid Disorders &  Cardiovascular Risk Reduction Clinic Diplomate of the American Board of Clinical Lipidology Attending Cardiologist  Direct Dial: [phone]  Fax: [phone]  Website:  [email or web address]   Nadean Corwin Hilty 01/28/2018, 10:08 AM

## 2018-02-03 ENCOUNTER — Ambulatory Visit: Payer: Medicare Other | Admitting: Internal Medicine

## 2018-05-08 NOTE — Progress Notes (Signed)
Cardiology Office Note   Date:  05/10/2018   ID:  [name], DOB 1960-06-07, MRN [id number]  PCP:  Elaine Koch, MD  Cardiologist:   Minus Breeding, MD   Chief Complaint  Patient presents with  . Shortness of Breath      History of Present Illness: Elaine Le is a 58 y.o. female who presents for follow-up after mitral valve replacement. She had probable rheumatic disease with severe regurgitation and moderate stenosis and is now status post minimally invasive valve replacement.   She had a stable replacement with bioprosthesis in Feb of last year.    Since I last saw her she has been doing okay.  She still gets some shortness of breath.  However, she had a BNP level that was normal.  The echo was as above.  She was seeing a pulmonologist and was given a bronchodilator.  She does not think this really helps.  She is otherwise not had any symptoms.  She can do some activities without being short of breath.  She is not describing PND orthopnea.  Seems to be mild.  Is not having any chest pressure, neck or arm discomfort.  She is not having any palpitations, presyncope or syncope.  She will have occasional dizziness which seems to be random and not positional.   Past Medical History:  Diagnosis Date  . Acute respiratory failure with hypercapnia ([place]) 06/21/2014  . Acute systolic heart failure ([place]) 09/2014  . Adenomatous colon polyp   . Arthritis   . Back pain   . Chronic diastolic heart failure (HCC)    related to MR/MS  . Chronic pain syndrome   . Chronic sinusitis   . CN (constipation) 05/25/2014  . Community acquired pneumonia   . COPD (chronic obstructive pulmonary disease)-PFT pending  07/17/2014  . Diverticulosis   . Fatigue   . Fibromyalgia   . GERD (gastroesophageal reflux disease)   . Headaches, cluster   . Hiatal hernia   . Hx of adenomatous colonic polyps 03/10/2011   Oct 2012, repeat colon Oct 2017   . IBS (irritable bowel syndrome)  01/21/2011  . Influenza A 06/29/2014  . Internal hemorrhoids   . Irritable bowel syndrome (IBS)   . Mitral valve regurgitation    severe  . Mitral valve stenosis, moderate 07/11/2014  . Muscle pain   . Muscular deconditioning 07/05/2014  . Night sweats   . Pleural effusion   . Pneumonia   . Protein-calorie malnutrition, severe ([place]) 07/06/2014  . 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 S/P minimally invasive mitral valve replacement with bioprosthetic valve 11/08/2014   25 mm [facility] Mitral bovine bioprosthetic tissue valve placed via right mini thoracotomy approach  . Tobacco abuse     Past Surgical History:  Procedure Laterality Date  . CYSTOSTOMY W/ BLADDER DILATION     at age 74  . LEFT AND RIGHT HEART CATHETERIZATION WITH CORONARY ANGIOGRAM N/A 07/27/2014   Procedure: LEFT AND RIGHT HEART CATHETERIZATION WITH CORONARY ANGIOGRAM;  Surgeon: Burnell Blanks, MD;  Location: [facility] CATH LAB;  Right left heart catheterization: Mild/minimal coronary artery disease. RV: 50/7/17, PA: 48/29 (mean 38), PCWP 33 mmHg  . MITRAL VALVE REPAIR Right 11/08/2014   Procedure: MINIMALLY INVASIVE MITRAL VALVE REPLACEMENT(MVR);  Surgeon: Rexene Alberts, MD;  Location: [place];  Service: Open Heart Surgery;  Laterality: Right;  . NEUROPLASTY / TRANSPOSITION MEDIAN NERVE AT CARPAL  TUNNEL BILATERAL    . TEE WITHOUT CARDIOVERSION N/A 07/10/2014   Procedure: TRANSESOPHAGEAL ECHOCARDIOGRAM (TEE);  Surgeon: Lelon Perla, MD;  Location: [facility] ENDOSCOPY;  Service: Cardiovascular;  55-60%. No regional wall motion modalities. Moderate mitral stenosis with severe regurgitation and moderate to severely dilated left atrium.  . TEE WITHOUT CARDIOVERSION N/A 11/08/2014   Procedure: TRANSESOPHAGEAL ECHOCARDIOGRAM (TEE);  Surgeon: Rexene Alberts, MD;  Location: [place];  Service: Open Heart Surgery;  Laterality: N/A;  . THORACIC OUTLET SURGERY    .  TRANSTHORACIC ECHOCARDIOGRAM  07/04/2014   Normal LV Size/Fnx - EF 60-65% no RWMA; MV: thickened leaflets with doming, fixed Posterior leaflet, anterior leaflet w/ large/shaggy & mobile density.  c/w Rheumatic Mitral disease - moderate MS & Mod-Severe MR.  . TUBAL LIGATION    . VULVA SURGERY       Current Outpatient Medications  Medication Sig Dispense Refill  . aspirin EC 81 MG tablet Take 81 mg by mouth daily.    . Biotin 800 MCG TABS Take 1 tablet by mouth daily.    Marland Kitchen levothyroxine (SYNTHROID, LEVOTHROID) 50 MCG tablet Take 50 mcg by mouth daily before breakfast.    . liothyronine (CYTOMEL) 5 MCG tablet Take 1 tablet by mouth daily.  2  . sodium chloride (OCEAN) 0.65 % nasal spray Place 2 sprays into both nostrils every 4 (four) hours as needed for congestion.     Marland Kitchen terconazole (TERAZOL 3) 0.8 % vaginal cream Place 1 applicator vaginally at bedtime. For 3 days. 20 g 0   No current facility-administered medications for this visit.     Allergies:   Montelukast sodium; Morphine and related; Other; Statins; and Warfarin and related    ROS:  Please see the history of present illness.   Otherwise, review of systems are positive for decreased vision.   All other systems are reviewed and negative.    PHYSICAL EXAM: VS:  BP 118/76   Pulse 80   Ht 5\' 5"  (1.651 m)   Wt 120 lb (54.4 kg)   BMI 19.97 kg/m  , BMI Body mass index is 19.97 kg/m.  GENERAL:  Well appearing NECK:  No jugular venous distention, waveform within normal limits, carotid upstroke brisk and symmetric, no bruits, no thyromegaly LUNGS:  Clear to auscultation bilaterally CHEST:  Right upper thoracotomy scar.  HEART:  PMI not displaced or sustained,S1 and S2 within normal limits, no S3, no S4, no clicks, no rubs, 2 out of 6 apical systolic murmur nonradiating and very brief, no diastolic murmurs ABD:  Flat, positive bowel sounds normal in frequency in pitch, no bruits, no rebound, no guarding, no midline pulsatile mass, no  hepatomegaly, no splenomegaly EXT:  2 plus pulses throughout, no edema, no cyanosis no clubbing     EKG:  EKG is  ordered today. Sinus rhythm, rate 80, axis within normal limits, intervals within normal limits, no acute ST-T wave changes.  Recent Labs: 11/09/2014: Magnesium 2.1 01/10/2015: ALT 27; BUN 9; Creatinine, Ser 0.70; Potassium 4.3; Sodium 139 02/01/2015: Hemoglobin 15.1*; Platelets 292.0 03/29/2015: TSH 4.03    Lipid Panel    Component Value Date/Time   TRIG 115 06/28/2014 1400      Wt Readings from Last 3 Encounters:  05/10/18 120 lb (54.4 kg)  01/28/18 127 lb 9.6 oz (57.9 kg)  09/24/17 130 lb (59 kg)      Other studies Reviewed: Additional studies/ records that were reviewed today include:  Echo    ASSESSMENT AND PLAN:  MVR:  She had normal valve function last year.  No further imaging is indicated.   DYSPNEA:   This does not seem to be cardiac.  No further work up is indicated.   DYSLIPIDEMIA:  She has seen Dr. Debara Pickett but she is intolerant of statin and did not want the PCSK9 inhibitor.   Current medicines are reviewed at length with the patient today.  The patient does not have concerns regarding medicines.  The following changes have been made:  None  Labs/ tests ordered today include:  None   Disposition:   FU with me in 12 months.    Signed, Minus Breeding, MD  05/10/2018 12:41 PM    [facility]

## 2018-05-10 ENCOUNTER — Encounter: Payer: Self-pay | Admitting: Cardiology

## 2018-05-10 ENCOUNTER — Telehealth: Payer: Self-pay | Admitting: Cardiology

## 2018-05-10 ENCOUNTER — Ambulatory Visit (INDEPENDENT_AMBULATORY_CARE_PROVIDER_SITE_OTHER): Payer: Medicare Other | Admitting: Cardiology

## 2018-05-10 VITALS — BP 118/76 | HR 80 | Ht 65.0 in | Wt 120.0 lb

## 2018-05-10 DIAGNOSIS — E785 Hyperlipidemia, unspecified: Secondary | ICD-10-CM | POA: Diagnosis not present

## 2018-05-10 DIAGNOSIS — Z952 Presence of prosthetic heart valve: Secondary | ICD-10-CM

## 2018-05-10 DIAGNOSIS — R0602 Shortness of breath: Secondary | ICD-10-CM | POA: Diagnosis not present

## 2018-05-10 NOTE — Telephone Encounter (Signed)
noted 

## 2018-05-10 NOTE — Telephone Encounter (Signed)
New Message:      Pt said she was here earlier today to see Dr Percival Spanish. She wants you to put her correct height down please. She said she is 5 feet and they have it down as 5.5 feet.i

## 2018-05-10 NOTE — Patient Instructions (Signed)
Medication Instructions:  START- Aspirin 81 mg daily  If you need a refill on your cardiac medications before your next appointment, please call your pharmacy.  Labwork: None Ordered  Take the provided lab slips with you to the lab for your blood draw.  When you have your labs (blood work) drawn today and your tests are completely normal, you will receive your results only by MyChart Message (if you have MyChart) -OR-  A paper copy in the mail.  If you have any lab test that is abnormal or we need to change your treatment, we will call you to review these results.  Testing/Procedures: None Ordered  Special Instructions: None Ordered  Follow-Up: You will need a follow up appointment in 12 months.  Please call our office 2 months in advance to schedule this appointment.  You may see Minus Breeding, MD or one of the following Advanced Practice Providers on your designated Care Team:   Rosaria Ferries, PA-C . Jory Sims, DNP, ANP   At [facility], you and your health needs are our priority.  As part of our continuing mission to provide you with exceptional heart care, we have created designated Provider Care Teams.  These Care Teams include your primary Cardiologist (physician) and Advanced Practice Providers (APPs -  Physician Assistants and Nurse Practitioners) who all work together to provide you with the care you need, when you need it.  Thank you for choosing [facility] at [facility]!!

## 2018-05-19 ENCOUNTER — Ambulatory Visit (INDEPENDENT_AMBULATORY_CARE_PROVIDER_SITE_OTHER)
Admission: RE | Admit: 2018-05-19 | Discharge: 2018-05-19 | Disposition: A | Discharge status: Home / Self Care | Payer: Medicare Other | Source: Ambulatory Visit | Attending: Family | Admitting: Family

## 2018-05-19 ENCOUNTER — Other Ambulatory Visit (INDEPENDENT_AMBULATORY_CARE_PROVIDER_SITE_OTHER): Payer: Medicare Other

## 2018-05-19 ENCOUNTER — Encounter: Payer: Self-pay | Admitting: Family

## 2018-05-19 ENCOUNTER — Ambulatory Visit (INDEPENDENT_AMBULATORY_CARE_PROVIDER_SITE_OTHER): Payer: Medicare Other | Admitting: Family

## 2018-05-19 VITALS — BP 112/78 | HR 74 | Temp 98.1°F | Resp 18 | Ht 60.0 in | Wt 128.1 lb

## 2018-05-19 DIAGNOSIS — E559 Vitamin D deficiency, unspecified: Secondary | ICD-10-CM

## 2018-05-19 DIAGNOSIS — M545 Low back pain, unspecified: Secondary | ICD-10-CM

## 2018-05-19 DIAGNOSIS — R300 Dysuria: Secondary | ICD-10-CM | POA: Diagnosis not present

## 2018-05-19 DIAGNOSIS — R202 Paresthesia of skin: Secondary | ICD-10-CM

## 2018-05-19 DIAGNOSIS — G8929 Other chronic pain: Secondary | ICD-10-CM

## 2018-05-19 DIAGNOSIS — R200 Anesthesia of skin: Secondary | ICD-10-CM

## 2018-05-19 LAB — POC URINALSYSI DIPSTICK (AUTOMATED)
Bilirubin, UA: NEGATIVE
Glucose, UA: NEGATIVE
Ketones, UA: NEGATIVE
LEUKOCYTES UA: NEGATIVE
Nitrite, UA: NEGATIVE
PH UA: 6 (ref 5.0–8.0)
Protein, UA: NEGATIVE
RBC UA: NEGATIVE
Spec Grav, UA: 1.01 (ref 1.010–1.025)
Urobilinogen, UA: 0.2 E.U./dL

## 2018-05-19 LAB — CBC WITH DIFFERENTIAL/PLATELET
Basophils Absolute: 0 10*3/uL (ref 0.0–0.1)
Basophils Relative: 0.7 % (ref 0.0–3.0)
EOS ABS: 0.2 10*3/uL (ref 0.0–0.7)
Eosinophils Relative: 3.2 % (ref 0.0–5.0)
HCT: 42.2 % (ref 36.0–46.0)
Hemoglobin: 14.5 g/dL (ref 12.0–15.0)
Lymphocytes Relative: 23.2 % (ref 12.0–46.0)
Lymphs Abs: 1.5 10*3/uL (ref 0.7–4.0)
MCHC: 34.3 g/dL (ref 30.0–36.0)
MCV: 86.2 fl (ref 78.0–100.0)
Monocytes Absolute: 0.4 10*3/uL (ref 0.1–1.0)
Monocytes Relative: 5.6 % (ref 3.0–12.0)
Neutro Abs: 4.4 10*3/uL (ref 1.4–7.7)
Neutrophils Relative %: 67.3 % (ref 43.0–77.0)
Platelets: 215 10*3/uL (ref 150.0–400.0)
RBC: 4.9 Mil/uL (ref 3.87–5.11)
RDW: 13.6 % (ref 11.5–15.5)
WBC: 6.5 10*3/uL (ref 4.0–10.5)

## 2018-05-19 LAB — COMPREHENSIVE METABOLIC PANEL
ALT: 32 U/L (ref 0–35)
AST: 23 U/L (ref 0–37)
Albumin: 4.2 g/dL (ref 3.5–5.2)
Alkaline Phosphatase: 83 U/L (ref 39–117)
BUN: 18 mg/dL (ref 6–23)
CO2: 27 mEq/L (ref 19–32)
CREATININE: 0.63 mg/dL (ref 0.40–1.20)
Calcium: 9.2 mg/dL (ref 8.4–10.5)
Chloride: 104 mEq/L (ref 96–112)
GFR: 97.09 mL/min (ref >60.00)
Glucose, Bld: 102 mg/dL — ABNORMAL HIGH (ref 70–99)
Potassium: 4.1 mEq/L (ref 3.5–5.1)
Sodium: 137 mEq/L (ref 135–145)
Total Bilirubin: 0.5 mg/dL (ref 0.2–1.2)
Total Protein: 6.7 g/dL (ref 6.0–8.3)

## 2018-05-19 LAB — VITAMIN D 25 HYDROXY (VIT D DEFICIENCY, FRACTURES): VITD: 23.98 ng/mL — ABNORMAL LOW (ref 30.00–100.00)

## 2018-05-19 MED ORDER — NITROFURANTOIN MONOHYD MACRO 100 MG PO CAPS: 100.0000 mg | ORAL_CAPSULE | Freq: Two times a day (BID) | ORAL | 0 refills | Status: DC

## 2018-05-19 MED ORDER — TERCONAZOLE 0.8 % VA CREA: 1.0000 | TOPICAL_CREAM | Freq: Every day | VAGINAL | 0 refills | Status: DC

## 2018-05-19 NOTE — Addendum Note (Signed)
Addended by: Marcina Millard on: 05/19/2018 02:24 PM   Modules accepted: Orders

## 2018-05-19 NOTE — Progress Notes (Signed)
Elaine Le is a 58 y.o. female with the following history as recorded in EpicCare:  Patient Active Problem List   Diagnosis Date Noted  . Mixed hyperlipidemia 01/28/2018  . Statin intolerance 01/28/2018  . Familial hypercholesterolemia 09/24/2017  . Dyslipidemia 08/25/2017  . Dysuria 05/07/2016  . Sinusitis, acute 11/23/2015  . Routine general medical examination at a health care facility 11/23/2015  . Localized swelling of both lower legs R> L 05/07/2015  . SOB (shortness of breath) 03/08/2015  . Fatigue 03/08/2015  . Hypothyroidism 02/14/2015  . Weight gain 01/10/2015  . Nasal polyps 11/30/2014  . S/P MVR (mitral valve replacement) 11/17/2014  . S/P minimally invasive mitral valve replacement with bioprosthetic valve 11/08/2014  . Chronic diastolic heart failure ([place])   . Coronary artery disease involving native coronary artery of native heart without angina pectoris 07/28/2014  . Chronic pain syndrome   . Tobacco abuse   . Acute on chronic respiratory failure with hypoxia ([place])   . COPD (chronic obstructive pulmonary disease)-mild 07/17/2014  . 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 Hx of adenomatous colonic polyps 03/10/2011  . IBS (irritable bowel syndrome) 01/21/2011  . GERD (gastroesophageal reflux disease) 01/21/2011  . Fibromyalgia 01/21/2011    Current Outpatient Medications  Medication Sig Dispense Refill  . ESTRACE VAGINAL 0.1 MG/GM vaginal cream APPLY 1/4 GRAM 2 TIMES A WEEK    . flunisolide (NASALIDE) 25 MCG/ACT (0.025%) SOLN SPRAY 2 SPRAYS INTO EACH NOSTRIL TWICE A DAY    . levothyroxine (SYNTHROID, LEVOTHROID) 50 MCG tablet Take 50 mcg by mouth daily before breakfast.    . liothyronine (CYTOMEL) 5 MCG tablet Take 1 tablet by mouth daily.  2  . sodium chloride (OCEAN) 0.65 % nasal spray Place 2 sprays into both nostrils every 4 (four) hours as needed for congestion.     Marland Kitchen terconazole (TERAZOL 3) 0.8 % vaginal cream Place 1 applicator vaginally  at bedtime. For 3 days. 20 g 0  . aspirin EC 81 MG tablet Take 81 mg by mouth daily.    . nitrofurantoin, macrocrystal-monohydrate, (MACROBID) 100 MG capsule Take 1 capsule (100 mg total) by mouth 2 (two) times daily. 14 capsule 0   No current facility-administered medications for this visit.     Allergies: Montelukast sodium; Morphine and related; Other; Statins; and Warfarin and related  Past Medical History:  Diagnosis Date  . Acute respiratory failure with hypercapnia ([place]) 06/21/2014  . Acute systolic heart failure ([place]) 09/2014  . Adenomatous colon polyp   . Arthritis   . Back pain   . Chronic diastolic heart failure (HCC)    related to MR/MS  . Chronic pain syndrome   . Chronic sinusitis   . CN (constipation) 05/25/2014  . Community acquired pneumonia   . COPD (chronic obstructive pulmonary disease)-PFT pending  07/17/2014  . Diverticulosis   . Fatigue   . Fibromyalgia   . GERD (gastroesophageal reflux disease)   . Headaches, cluster   . Hiatal hernia   . Hx of adenomatous colonic polyps 03/10/2011   Oct 2012, repeat colon Oct 2017   . IBS (irritable bowel syndrome) 01/21/2011  . Influenza A 06/29/2014  . Internal hemorrhoids   . Irritable bowel syndrome (IBS)   . Mitral valve regurgitation    severe  . Mitral valve stenosis, moderate 07/11/2014  . Muscle pain   . Muscular deconditioning 07/05/2014  . Night sweats   . Pleural effusion   . Pneumonia   . Protein-calorie malnutrition, severe (  [place]) 07/06/2014  . 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 S/P minimally invasive mitral valve replacement with bioprosthetic valve 11/08/2014   25 mm [facility] Mitral bovine bioprosthetic tissue valve placed via right mini thoracotomy approach  . Tobacco abuse     Past Surgical History:  Procedure Laterality Date  . CYSTOSTOMY W/ BLADDER DILATION     at age 7  . LEFT AND RIGHT HEART CATHETERIZATION WITH CORONARY  ANGIOGRAM N/A 07/27/2014   Procedure: LEFT AND RIGHT HEART CATHETERIZATION WITH CORONARY ANGIOGRAM;  Surgeon: Burnell Blanks, MD;  Location[facility] CATH LAB;  Right left heart catheterization: Mild/minimal coronary artery disease. RV: 50/7/17, PA: 48/29 (mean 38), PCWP 33 mmHg  . MITRAL VALVE REPAIR Right 11/08/2014   Procedure: MINIMALLY INVASIVE MITRAL VALVE REPLACEMENT(MVR);  Surgeon: Rexene Alberts, MD;  Location: [place];  Service: Open Heart Surgery;  Laterality: Right;  . NEUROPLASTY / TRANSPOSITION MEDIAN NERVE AT CARPAL TUNNEL BILATERAL    . TEE WITHOUT CARDIOVERSION N/A 07/10/2014   Procedure: TRANSESOPHAGEAL ECHOCARDIOGRAM (TEE);  Surgeon: Lelon Perla, MD;  Location: [facility] ENDOSCOPY;  Service: Cardiovascular;  55-60%. No regional wall motion modalities. Moderate mitral stenosis with severe regurgitation and moderate to severely dilated left atrium.  . TEE WITHOUT CARDIOVERSION N/A 11/08/2014   Procedure: TRANSESOPHAGEAL ECHOCARDIOGRAM (TEE);  Surgeon: Rexene Alberts, MD;  Location: [place];  Service: Open Heart Surgery;  Laterality: N/A;  . THORACIC OUTLET SURGERY    . TRANSTHORACIC ECHOCARDIOGRAM  07/04/2014   Normal LV Size/Fnx - EF 60-65% no RWMA; MV: thickened leaflets with doming, fixed Posterior leaflet, anterior leaflet w/ large/shaggy & mobile density.  c/w Rheumatic Mitral disease - moderate MS & Mod-Severe MR.  . TUBAL LIGATION    . VULVA SURGERY      Family History  Problem Relation Age of Onset  . Kidney cancer Mother   . Colon polyps Mother   . Irritable bowel syndrome Mother   . Diabetes Sister   . Liver disease Sister   . Irritable bowel syndrome Daughter   . Diabetes Brother   . Colon cancer Neg Hx   . Thyroid disease Neg Hx     Social History   Tobacco Use  . Smoking status: Former Smoker    Packs/day: 0.50    Years: 35.00    Pack years: 17.50    Types: Cigarettes    Last attempt to quit: 06/17/2014    Years since quitting: 3.9  . Smokeless tobacco: Never  Used  Substance Use Topics  . Alcohol use: No    Alcohol/week: 0.0 standard drinks    Comment: rare    Subjective:  Patient presents with concerns for possible UTI; notes she is prone to urinary tract infections; + burning, urgency; notes was treated for UTI by GYN approximately 6 months ago; no blood in urine; does have prescription for estrace vaginal cream but not using regularly; Also complaining of chronic low back pain x months; notes that legs often feel like they are weak/ could give out on her when she first stands; does have occasional numbness.    Objective:  Vitals:   05/19/18 1341  BP: 112/78  Pulse: 74  Resp: 18  Temp: 98.1 F (36.7 C)  TempSrc: Oral  Weight: 128 lb 1.3 oz (58.1 kg)  Height: 5' 5"  (1.651 m)    General: Well developed, well nourished, in no acute distress  Skin : Warm and dry.  Head: Normocephalic and atraumatic  Eyes: Sclera and conjunctiva clear; pupils round and reactive to light; extraocular movements intact  Ears: External normal; canals clear; tympanic membranes normal  Oropharynx: Pink, supple. No suspicious lesions  Neck: Supple without thyromegaly, adenopathy  Lungs: Respirations unlabored; Musculoskeletal: No deformities; no active joint inflammation  Extremities: No edema, cyanosis, clubbing  Vessels: Symmetric bilaterally  Neurologic: Alert and oriented; speech intact; face symmetrical; moves all extremities well; CNII-XII intact without focal deficit   Assessment:  1. Dysuria   2. Numbness and tingling   3. Vitamin D deficiency   4. Chronic bilateral low back pain without sciatica     Plan:  1. Check U/A and urine culture; Rx for Macrobid 100 mg bid x 7 days; follow-up to be determined; encouraged to use topical estrogen cream to help minimize symptoms. 2. Update labs today; 3. Check Vitamin D today; 4. Update X-ray today; may need to refer to orthopedist.    No follow-ups on file.  Orders Placed This Encounter  Procedures  .  Urine Culture    Standing Status:   Future    Standing Expiration Date:   05/19/2019  . DG Lumbar Spine 2-3 Views    Standing Status:   Future    Standing Expiration Date:   07/18/2019    Order Specific Question:   Reason for Exam (SYMPTOM  OR DIAGNOSIS REQUIRED)    Answer:   low back pain    Order Specific Question:   Is patient pregnant?    Answer:   No    Order Specific Question:   Preferred imaging location?    Answer:   [facility]    Order Specific Question:   Radiology Contrast Protocol - do NOT remove file path    Answer:   [email or web address]  . CBC w/Diff    Standing Status:   Future    Standing Expiration Date:   05/19/2019  . Comp Met (CMET)    Standing Status:   Future    Standing Expiration Date:   05/19/2019  . B12    Standing Status:   Future    Standing Expiration Date:   05/19/2019  . Vitamin D (25 hydroxy)    Standing Status:   Future    Standing Expiration Date:   05/19/2019    Requested Prescriptions   Signed Prescriptions Disp Refills  . terconazole (TERAZOL 3) 0.8 % vaginal cream 20 g 0    Sig: Place 1 applicator vaginally at bedtime. For 3 days.  . nitrofurantoin, macrocrystal-monohydrate, (MACROBID) 100 MG capsule 14 capsule 0    Sig: Take 1 capsule (100 mg total) by mouth 2 (two) times daily.

## 2018-05-20 LAB — URINE CULTURE
MICRO NUMBER: 89168
Result:: NO GROWTH
SPECIMEN QUALITY:: ADEQUATE

## 2018-05-20 LAB — VITAMIN B12: Vitamin B-12: 397 pg/mL (ref 211–911)

## 2018-05-21 ENCOUNTER — Other Ambulatory Visit: Payer: Self-pay | Admitting: Family

## 2018-05-21 MED ORDER — VITAMIN D (ERGOCALCIFEROL) 1.25 MG (50000 UNIT) PO CAPS: 50000.0000 [IU] | ORAL_CAPSULE | ORAL | 0 refills | Status: AC

## 2018-05-24 ENCOUNTER — Other Ambulatory Visit: Payer: Self-pay | Admitting: Family

## 2018-05-24 DIAGNOSIS — M545 Low back pain: Principal | ICD-10-CM

## 2018-05-24 DIAGNOSIS — G8929 Other chronic pain: Secondary | ICD-10-CM

## 2018-06-02 ENCOUNTER — Ambulatory Visit (INDEPENDENT_AMBULATORY_CARE_PROVIDER_SITE_OTHER): Payer: Medicare Other | Admitting: Internal Medicine

## 2018-06-02 ENCOUNTER — Encounter: Payer: Self-pay | Admitting: Internal Medicine

## 2018-06-02 VITALS — BP 120/80 | HR 72 | Ht 60.0 in | Wt 132.0 lb

## 2018-06-02 DIAGNOSIS — R140 Abdominal distension (gaseous): Secondary | ICD-10-CM

## 2018-06-02 DIAGNOSIS — K581 Irritable bowel syndrome with constipation: Secondary | ICD-10-CM

## 2018-06-02 DIAGNOSIS — Z86010 Personal history of colonic polyps: Secondary | ICD-10-CM

## 2018-06-02 MED ORDER — PEG-KCL-NACL-NASULF-NA ASC-C 140 G PO SOLR: 1.0000 | ORAL | 0 refills | Status: DC

## 2018-06-02 MED ORDER — LUBIPROSTONE 8 MCG PO CAPS: 8.0000 ug | ORAL_CAPSULE | Freq: Two times a day (BID) | ORAL | 0 refills | Status: DC

## 2018-06-02 NOTE — Progress Notes (Signed)
Subjective:    Patient ID: Elaine Le, female    DOB: 03/30/1961, 58 y.o.   MRN: [id number]  HPI Elaine Le is a 58 year old female with a history of IBS, adenomatous colon polyp, GERD and constipation who is here for follow-up.  She also has a history of mitral valve replacement, COPD, fibromyalgia, sacroiliitis and hypothyroidism.  She was last seen on 04/08/2016.  At her last visit for her IBS with constipation we kept her on MiraLAX every other day and Benefiber daily.  We also recommended a fit test as she was hesitant for surveillance colonoscopy.  She never returned this test.  Her biggest complaint of late has been constipation with incomplete bowel movement and significant abdominal bloating.  She is having bowel movements most days but they are small and incomplete.  No blood or melena.  Stools have been greenish to yellow but mostly formed.  She is not using MiraLAX as it was not really helping and she has been off of Benefiber.  She did for about 2 months eat a diet very high in kale and found her bowel movements to be better but thought this may have been interfering with thyroid function so she stopped.  She is tried Electronics engineer which has also not helped much.    Review of Systems As per HPI, otherwise negative  Current Medications, Allergies, Past Medical History, Past Surgical History, Family History and Social History were reviewed in [employer] record.     Objective:   Physical Exam BP 120/80   Pulse 72   Ht 5' (1.524 m)   Wt 132 lb (59.9 kg)   BMI 25.78 kg/m  Gen: awake, alert, NAD HEENT: anicteric, op clear CV: RRR, no mrg Pulm: CTA b/l Abd: soft, NT/ND, +BS throughout Ext: no c/c/e Neuro: nonfocal  CBC    Component Value Date/Time   WBC 6.5 05/19/2018 1420   RBC 4.90 05/19/2018 1420   HGB 14.5 05/19/2018 1420   HCT 42.2 05/19/2018 1420   PLT 215.0 05/19/2018 1420   MCV 86.2 05/19/2018 1420   MCH 28.6 11/14/2014 0445   MCHC 34.3 05/19/2018 1420   RDW 13.6 05/19/2018 1420   LYMPHSABS 1.5 05/19/2018 1420   MONOABS 0.4 05/19/2018 1420   EOSABS 0.2 05/19/2018 1420   BASOSABS 0.0 05/19/2018 1420   CMP     Component Value Date/Time   NA 137 05/19/2018 1420   K 4.1 05/19/2018 1420   CL 104 05/19/2018 1420   CO2 27 05/19/2018 1420   GLUCOSE 102 (H) 05/19/2018 1420   BUN 18 05/19/2018 1420   CREATININE 0.63 05/19/2018 1420   CALCIUM 9.2 05/19/2018 1420   PROT 6.7 05/19/2018 1420   ALBUMIN 4.2 05/19/2018 1420   AST 23 05/19/2018 1420   ALT 32 05/19/2018 1420   ALKPHOS 83 05/19/2018 1420   BILITOT 0.5 05/19/2018 1420   GFRNONAA >60 11/14/2014 0445   GFRAA >60 11/14/2014 0445      Assessment & Plan:  58 year old female with a history of IBS, adenomatous colon polyp, GERD and constipation who is here for follow-up.   1.  IBS with constipation and bloating --Long discussion today regarding IBS with constipation, gut motility and the micro-biome.  Begin Amitiza 8 mcg twice daily.  2.  History of colon polyp --overdue for surveillance.  Colonoscopy strongly recommended.  2-day bowel prep.  We discussed the risk, benefits and alternatives and she is agreeable and wishes to proceed  25 minutes spent with  the patient today. Greater than 50% was spent in counseling and coordination of care with the patient

## 2018-06-02 NOTE — Patient Instructions (Signed)
You have been scheduled for a colonoscopy. Please follow written instructions given to you at your visit today.  Please pick up your prep supplies at the pharmacy within the next 1-3 days. If you use inhalers (even only as needed), please bring them with you on the day of your procedure. Your physician has requested that you go to www.startemmi.com and enter the access code given to you at your visit today. This web site gives a general overview about your procedure. However, you should still follow specific instructions given to you by our office regarding your preparation for the procedure.  We have given you samples of the following medication to take: Amitiza 8 mcg twice daily (if this works well, we can send you a prescription)  If you are age 40 or older, your body mass index should be between 23-30. Your Body mass index is 25.78 kg/m. If this is out of the aforementioned range listed, please consider follow up with your Primary Care Provider.  If you are age 48 or younger, your body mass index should be between 19-25. Your Body mass index is 25.78 kg/m. If this is out of the aformentioned range listed, please consider follow up with your Primary Care Provider.

## 2018-06-11 ENCOUNTER — Telehealth: Payer: Self-pay | Admitting: Internal Medicine

## 2018-06-11 NOTE — Telephone Encounter (Signed)
Pt states that her copay for plenvu is $50, she wants to know if she can have something more affordable.

## 2018-06-11 NOTE — Telephone Encounter (Signed)
I have spoken to patient to advise that we can give her GoLytely if she prefers since that would be much cheaper through her insurance typically (we do not have Plenvu samples at this time), however, that would be much higher volume. Patient indicates that she would like to do that rather than pay $50. I advised that I could go ahead and send rx and have her come for re instruction with different prep. She then indicated that she wondered about the prep she had last time because it was covered by her insurance then. I looked it up and she had Moviprep back in 2012. I explained that although insurance may have covered Moviprep in 2012, they very well may not cover it anymore as often formularies change, however, I would still be glad to try this prep. Patient states to send it and she will call me if not covered. I then advised her to please call her insurance to find out which colonoscopy preps they do cover prior to [place] sending a prep in, that way we are not calling back and forth trying to send multiple preps. She states that she does not call insurance, her pharmacy does. I advised that she could contact insurance by calling the number on the back of her insurance card. She then indicated to me, "why dont we just do this then. Why dont we cancel the March 3 procedure until this can be figured out." Therefore, procedure has been canceled at this time.

## 2018-06-29 ENCOUNTER — Encounter: Payer: Medicare Other | Admitting: Internal Medicine

## 2018-11-22 ENCOUNTER — Ambulatory Visit: Payer: Self-pay | Admitting: Internal Medicine

## 2018-11-22 ENCOUNTER — Encounter: Payer: Self-pay | Admitting: Family

## 2018-11-22 ENCOUNTER — Ambulatory Visit (INDEPENDENT_AMBULATORY_CARE_PROVIDER_SITE_OTHER): Payer: Medicare Other | Admitting: Family

## 2018-11-22 DIAGNOSIS — J0140 Acute pansinusitis, unspecified: Secondary | ICD-10-CM | POA: Diagnosis not present

## 2018-11-22 MED ORDER — AMOXICILLIN-POT CLAVULANATE 875-125 MG PO TABS: 1.0000 | ORAL_TABLET | Freq: Two times a day (BID) | ORAL | 0 refills | Status: DC

## 2018-11-22 MED ORDER — TERCONAZOLE 0.8 % VA CREA: 1.0000 | TOPICAL_CREAM | Freq: Every day | VAGINAL | 0 refills | Status: DC

## 2018-11-22 MED ORDER — FLUCONAZOLE 150 MG PO TABS: 150.0000 mg | ORAL_TABLET | Freq: Once | ORAL | 0 refills | Status: AC

## 2018-11-22 NOTE — Telephone Encounter (Signed)
Pt has an appt at 2 with laura

## 2018-11-22 NOTE — Progress Notes (Signed)
Elaine Le is a 58 y.o. female with the following history as recorded in EpicCare:  Patient Active Problem List   Diagnosis Date Noted  . Mixed hyperlipidemia 01/28/2018  . Statin intolerance 01/28/2018  . Familial hypercholesterolemia 09/24/2017  . Dyslipidemia 08/25/2017  . Dysuria 05/07/2016  . Sinusitis, acute 11/23/2015  . Routine general medical examination at a health care facility 11/23/2015  . Localized swelling of both lower legs R> L 05/07/2015  . SOB (shortness of breath) 03/08/2015  . Fatigue 03/08/2015  . Hypothyroidism 02/14/2015  . Weight gain 01/10/2015  . Nasal polyps 11/30/2014  . S/P MVR (mitral valve replacement) 11/17/2014  . S/P minimally invasive mitral valve replacement with bioprosthetic valve 11/08/2014  . Chronic diastolic heart failure ([place])   . Coronary artery disease involving native coronary artery of native heart without angina pectoris 07/28/2014  . Chronic pain syndrome   . Tobacco abuse   . Acute on chronic respiratory failure with hypoxia ([place])   . COPD (chronic obstructive pulmonary disease)-mild 07/17/2014  . 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 Hx of adenomatous colonic polyps 03/10/2011  . IBS (irritable bowel syndrome) 01/21/2011  . GERD (gastroesophageal reflux disease) 01/21/2011  . Fibromyalgia 01/21/2011    Current Outpatient Medications  Medication Sig Dispense Refill  . amoxicillin-clavulanate (AUGMENTIN) 875-125 MG tablet Take 1 tablet by mouth 2 (two) times daily. 20 tablet 0  . ESTRACE VAGINAL 0.1 MG/GM vaginal cream APPLY 1/4 GRAM 2 TIMES A WEEK    . fluconazole (DIFLUCAN) 150 MG tablet Take 1 tablet (150 mg total) by mouth once for 1 dose. Repeat after 72 hours 2 tablet 0  . liothyronine (CYTOMEL) 5 MCG tablet Take 2.5 tablets by mouth daily.   2  . PEG-KCl-NaCl-NaSulf-Na Asc-C (PLENVU) 140 g SOLR Take 1 kit by mouth as directed. Use coupon: BIN: [id number] PNC: CNRX Group: [id number] ID: [id number] 1 each 0   . sodium chloride (OCEAN) 0.65 % nasal spray Place 2 sprays into both nostrils every 4 (four) hours as needed for congestion.     Marland Kitchen SYNTHROID 75 MCG tablet TAKE 1 TABLET IN THE MORNING ON AN EMPTY STOMACH    . terconazole (TERAZOL 3) 0.8 % vaginal cream Place 1 applicator vaginally at bedtime. For 3 days. 20 g 0   No current facility-administered medications for this visit.     Allergies: Montelukast sodium, Morphine and related, Other, Statins, and Warfarin and related  Past Medical History:  Diagnosis Date  . Acute respiratory failure with hypercapnia ([place]) 06/21/2014  . Acute systolic heart failure ([place]) 09/2014  . Adenomatous colon polyp   . Arthritis   . Back pain   . Chronic diastolic heart failure (H[facility])    related to MR/MS  . Chronic pain syndrome   . Chronic sinusitis   . CN (constipation) 05/25/2014  . Community acquired pneumonia   . COPD (chronic obstructive pulmonary disease)-PFT pending  07/17/2014  . Diverticulosis   . Fatigue   . Fibromyalgia   . GERD (gastroesophageal reflux disease)   . Headaches, cluster   . Hiatal hernia   . Hx of adenomatous colonic polyps 03/10/2011   Oct 2012, repeat colon Oct 2017   . IBS (irritable bowel syndrome) 01/21/2011  . Influenza A 06/29/2014  . Internal hemorrhoids   . Irritable bowel syndrome (IBS)   . Mitral valve regurgitation    severe  . Mitral valve stenosis, moderate 07/11/2014  . Muscle pain   . Muscular deconditioning 07/05/2014  .  Night sweats   . Pleural effusion   . Pneumonia   . Protein-calorie malnutrition, severe ([place]) 07/06/2014  . 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 S/P minimally invasive mitral valve replacement with bioprosthetic valve 11/08/2014   25 mm [facility] Mitral bovine bioprosthetic tissue valve placed via right mini thoracotomy approach  . Tobacco abuse     Past Surgical History:  Procedure Laterality Date  . CYSTOSTOMY W/  BLADDER DILATION     at age 29  . LEFT AND RIGHT HEART CATHETERIZATION WITH CORONARY ANGIOGRAM N/A 07/27/2014   Procedure: LEFT AND RIGHT HEART CATHETERIZATION WITH CORONARY ANGIOGRAM;  Surgeon: Burnell Blanks, MD;  Location: [facility] CATH LAB;  Right left heart catheterization: Mild/minimal coronary artery disease. RV: 50/7/17, PA: 48/29 (mean 38), PCWP 33 mmHg  . MITRAL VALVE REPAIR Right 11/08/2014   Procedure: MINIMALLY INVASIVE MITRAL VALVE REPLACEMENT(MVR);  Surgeon: Rexene Alberts, MD;  Location: [place];  Service: Open Heart Surgery;  Laterality: Right;  . NEUROPLASTY / TRANSPOSITION MEDIAN NERVE AT CARPAL TUNNEL BILATERAL    . TEE WITHOUT CARDIOVERSION N/A 07/10/2014   Procedure: TRANSESOPHAGEAL ECHOCARDIOGRAM (TEE);  Surgeon: Lelon Perla, MD;  Location: [facility] ENDOSCOPY;  Service: Cardiovascular;  55-60%. No regional wall motion modalities. Moderate mitral stenosis with severe regurgitation and moderate to severely dilated left atrium.  . TEE WITHOUT CARDIOVERSION N/A 11/08/2014   Procedure: TRANSESOPHAGEAL ECHOCARDIOGRAM (TEE);  Surgeon: Rexene Alberts, MD;  Location: [place];  Service: Open Heart Surgery;  Laterality: N/A;  . THORACIC OUTLET SURGERY    . TRANSTHORACIC ECHOCARDIOGRAM  07/04/2014   Normal LV Size/Fnx - EF 60-65% no RWMA; MV: thickened leaflets with doming, fixed Posterior leaflet, anterior leaflet w/ large/shaggy & mobile density.  c/w Rheumatic Mitral disease - moderate MS & Mod-Severe MR.  . TUBAL LIGATION    . VULVA SURGERY      Family History  Problem Relation Age of Onset  . Kidney cancer Mother   . Colon polyps Mother   . Irritable bowel syndrome Mother   . Diabetes Sister   . Liver disease Sister   . Irritable bowel syndrome Daughter   . Diabetes Brother   . Colon cancer Neg Hx   . Thyroid disease Neg Hx     Social History   Tobacco Use  . Smoking status: Former Smoker    Packs/day: 0.50    Years: 35.00    Pack years: 17.50    Types: Cigarettes    Quit  date: 06/17/2014    Years since quitting: 4.4  . Smokeless tobacco: Never Used  Substance Use Topics  . Alcohol use: No    Alcohol/week: 0.0 standard drinks    Comment: rare    Subjective:    I connected with Elaine Le on 11/22/18 at  2:00 PM EDT by telephone call and verified that I am speaking with the correct person using two identifiers.   I discussed the limitations of evaluation and management by telemedicine and the availability of in person appointments. The patient expressed understanding and agreed to proceed.  Complaining of sinus pain/ pressure x 1 week; complaining of feeling "light-headed"; no fever, no chest pain, pressure; + frontal sinus pressure; does feel some drainage in the back of her throat; has been using Claritin, OTC decongestant; is prone to recurrent sinus infections; has been working in her home recently and feels like she has been exposed to more dust recently  to cause a sinus infection.    Objective:  There were no vitals filed for this visit.  General: Well developed, well nourished, in no acute distress  Lungs: Respirations unlabored;  Neurologic: Alert and oriented; speech intact; face symmetrical;   Assessment:  1. Acute pansinusitis, recurrence not specified     Plan:  Rx for Augmentin 875 mg bid x 10 days; continue Claritin, saline nasal spray and OTC decongestant; increase fluids, rest and follow-up worse, no better.   Spent 10 minutes on telephone call  No follow-ups on file.  No orders of the defined types were placed in this encounter.   Requested Prescriptions   Signed Prescriptions Disp Refills  . amoxicillin-clavulanate (AUGMENTIN) 875-125 MG tablet 20 tablet 0    Sig: Take 1 tablet by mouth 2 (two) times daily.  . fluconazole (DIFLUCAN) 150 MG tablet 2 tablet 0    Sig: Take 1 tablet (150 mg total) by mouth once for 1 dose. Repeat after 72 hours  . terconazole (TERAZOL 3) 0.8 % vaginal cream 20 g 0    Sig: Place 1  applicator vaginally at bedtime. For 3 days.

## 2018-11-22 NOTE — Telephone Encounter (Signed)
Pt. States she started having sinus pain and pressure yesterday. States nose is blocked. Has facial pain and pressure. No fever. Has been using Claritin and Sudafed. Warm transfer to Tammy in the practice. Answer Assessment - Initial Assessment Questions 1. LOCATION: "Where does it hurt?"      Sinus headache, cheeks 2. ONSET: "When did the sinus pain start?"  (e.g., hours, days)      Started yesterday 3. SEVERITY: "How bad is the pain?"   (Scale 1-10; mild, moderate or severe)   - MILD (1-3): doesn't interfere with normal activities    - MODERATE (4-7): interferes with normal activities (e.g., work or school) or awakens from sleep   - SEVERE (8-10): excruciating pain and patient unable to do any normal activities        7 4. RECURRENT SYMPTOM: "Have you ever had sinus problems before?" If so, ask: "When was the last time?" and "What happened that time?"      Yes 5. NASAL CONGESTION: "Is the nose blocked?" If so, ask, "Can you open it or must you breathe through the mouth?"     Blocked 6. NASAL DISCHARGE: "Do you have discharge from your nose?" If so ask, "What color?"     No 7. FEVER: "Do you have a fever?" If so, ask: "What is it, how was it measured, and when did it start?"      No 8. OTHER SYMPTOMS: "Do you have any other symptoms?" (e.g., sore throat, cough, earache, difficulty breathing)     Ears have pressure 9. PREGNANCY: "Is there any chance you are pregnant?" "When was your last menstrual period?"     No  Protocols used: SINUS PAIN OR CONGESTION-A-AH

## 2019-02-18 ENCOUNTER — Other Ambulatory Visit: Payer: Self-pay

## 2019-02-18 ENCOUNTER — Ambulatory Visit (INDEPENDENT_AMBULATORY_CARE_PROVIDER_SITE_OTHER): Payer: Medicare Other | Admitting: Internal Medicine

## 2019-02-18 ENCOUNTER — Encounter: Payer: Self-pay | Admitting: Internal Medicine

## 2019-02-18 DIAGNOSIS — Z20828 Contact with and (suspected) exposure to other viral communicable diseases: Secondary | ICD-10-CM

## 2019-02-18 DIAGNOSIS — Z20822 Contact with and (suspected) exposure to covid-19: Secondary | ICD-10-CM

## 2019-02-18 DIAGNOSIS — R509 Fever, unspecified: Secondary | ICD-10-CM | POA: Diagnosis not present

## 2019-02-18 MED ORDER — AZITHROMYCIN 250 MG PO TABS: ORAL_TABLET | ORAL | 0 refills | Status: DC

## 2019-02-18 NOTE — Patient Instructions (Addendum)
Please take all new medication as prescribed  Please go for COVID testing  Please continue all other medications as before, and refills have been done if requested.  Please have the pharmacy call with any other refills you may need.  Please keep your appointments with your specialists as you may have planned

## 2019-02-18 NOTE — Assessment & Plan Note (Signed)
See notes

## 2019-02-18 NOTE — Progress Notes (Signed)
Patient ID: Elaine Le, female   DOB: 1960-08-04, 58 y.o.   MRN: [id number]   Phone visit   Cumulative time during 7-day interval 12, there was not an associated office visit for this concern within a 7 day period.  Verbal consent for services obtained from patient prior to services given.  Names of all persons present for services: Cathlean Cower, MD, patient  Chief complaint: fever  History, background, results pertinent:   Here with 2-3 days acute onset fever, facial pain, pressure, headache, general weakness and malaise, and greenish d/c, with mild ST and cough, but pt denies chest pain, wheezing, increased sob or doe, orthopnea, PND, increased LE swelling, palpitations, dizziness or syncope.   Past Medical History:  Diagnosis Date  . Acute respiratory failure with hypercapnia ([place]) 06/21/2014  . Acute systolic heart failure ([place]) 09/2014  . Adenomatous colon polyp   . Arthritis   . Back pain   . Chronic diastolic heart failure (HCC)    related to MR/MS  . Chronic pain syndrome   . Chronic sinusitis   . CN (constipation) 05/25/2014  . Community acquired pneumonia   . COPD (chronic obstructive pulmonary disease)-PFT pending  07/17/2014  . Diverticulosis   . Fatigue   . Fibromyalgia   . GERD (gastroesophageal reflux disease)   . Headaches, cluster   . Hiatal hernia   . Hx of adenomatous colonic polyps 03/10/2011   Oct 2012, repeat colon Oct 2017   . IBS (irritable bowel syndrome) 01/21/2011  . Influenza A 06/29/2014  . Internal hemorrhoids   . Irritable bowel syndrome (IBS)   . Mitral valve regurgitation    severe  . Mitral valve stenosis, moderate 07/11/2014  . Muscle pain   . Muscular deconditioning 07/05/2014  . Night sweats   . Pleural effusion   . Pneumonia   . Protein-calorie malnutrition, severe ([place]) 07/06/2014  . 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 S/P minimally invasive mitral valve  replacement with bioprosthetic valve 11/08/2014   25 mm [facility] Mitral bovine bioprosthetic tissue valve placed via right mini thoracotomy approach  . Tobacco abuse    No results found for this or any previous visit (from the past 48 hour(s)). Lab Results  Component Value Date   WBC 6.5 05/19/2018   HGB 14.5 05/19/2018   HCT 42.2 05/19/2018   PLT 215.0 05/19/2018   GLUCOSE 102 (H) 05/19/2018   TRIG 115 06/28/2014   ALT 32 05/19/2018   AST 23 05/19/2018   NA 137 05/19/2018   K 4.1 05/19/2018   CL 104 05/19/2018   CREATININE 0.63 05/19/2018   BUN 18 05/19/2018   CO2 27 05/19/2018   TSH 1.54 10/10/2015   INR 1.6 01/15/2015   HGBA1C 6.3 (H) 11/06/2014   A/P/next steps:   1)  Febrile illness - c/w URI, cant r/o strep vs viral - for zpack, also refer for covid testing, and otc cough med prn  Cathlean Cower MD

## 2019-02-19 LAB — NOVEL CORONAVIRUS, NAA: SARS-CoV-2, NAA: NOT DETECTED

## 2019-02-25 ENCOUNTER — Ambulatory Visit: Payer: Self-pay | Admitting: *Deleted

## 2019-02-25 ENCOUNTER — Telehealth: Payer: Self-pay | Admitting: Internal Medicine

## 2019-02-25 MED ORDER — FLUCONAZOLE 150 MG PO TABS: ORAL_TABLET | ORAL | 1 refills | Status: DC

## 2019-02-25 MED ORDER — DOXYCYCLINE HYCLATE 100 MG PO TABS: 100.0000 mg | ORAL_TABLET | Freq: Two times a day (BID) | ORAL | 0 refills | Status: DC

## 2019-02-25 NOTE — Telephone Encounter (Signed)
Summary: headache, low grade temp, sinus pressure    Patient requesting call back from NT to discuss sinus pressure, headache and low grade temp (99).      Patient was treated for sinus infection- she finished her antibiotic on Monday. Patient still has symptoms. Call to office- patient is requesting Dr Jenny Reichmann Valere Dross. Office request message for review to see if retreatment is option vs appointment. Patient will expect call back. Reason for Disposition . Fever present > 3 days (72 hours)  Answer Assessment - Initial Assessment Questions 1. LOCATION: "Where does it hurt?"      Pressure- near bridge of nose above the cheek- both sides 2. ONSET: "When did the sinus pain start?"  (e.g., hours, days)      Over 7 days 3. SEVERITY: "How bad is the pain?"   (Scale 1-10; mild, moderate or severe)   - MILD (1-3): doesn't interfere with normal activities    - MODERATE (4-7): interferes with normal activities (e.g., work or school) or awakens from sleep   - SEVERE (8-10): excruciating pain and patient unable to do any normal activities        Mild/moderate 4. RECURRENT SYMPTOM: "Have you ever had sinus problems before?" If so, ask: "When was the last time?" and "What happened that time?"      Allergy and sinus infection in the past- antibiotic 5. NASAL CONGESTION: "Is the nose blocked?" If so, ask, "Can you open it or must you breathe through the mouth?"     Yes- on and off- right side is blocked 6. NASAL DISCHARGE: "Do you have discharge from your nose?" If so ask, "What color?"     Bloody discharge- dry nasal passages 7. FEVER: "Do you have a fever?" If so, ask: "What is it, how was it measured, and when did it start?"      99.6- fever present 2 weeks 8. OTHER SYMPTOMS: "Do you have any other symptoms?" (e.g., sore throat, cough, earache, difficulty breathing)     Fever, headache, hack cough 9. PREGNANCY: "Is there any chance you are pregnant?" "When was your last menstrual period?"      n/a  Protocols used: SINUS PAIN OR CONGESTION-A-AH

## 2019-02-25 NOTE — Telephone Encounter (Signed)
The only alternative would be to take No antibiotics, since they can all can do this  I would not know which to change to, since there is no one antibiotic with less chance for this  I can send diflucan to take if she needs - done

## 2019-02-25 NOTE — Telephone Encounter (Signed)
Pt stated that doxycycline (VIBRA-TABS) 100 MG tablet Does not work well for her as she is prone to get yeast infections. She would like to know if an alternative can be sent it. Please advise.  [id number] - [place], [place]. [address] [phone] (Phone) [phone] (Fax)

## 2019-02-25 NOTE — Telephone Encounter (Signed)
Ok for doxy course - done erx 

## 2019-02-25 NOTE — Telephone Encounter (Signed)
Patient saw Jenny Reichmann for this

## 2019-02-25 NOTE — Addendum Note (Signed)
Addended by: Biagio Borg on: 02/25/2019 02:04 PM   Modules accepted: Orders

## 2019-02-25 NOTE — Telephone Encounter (Signed)
See below

## 2019-02-28 NOTE — Telephone Encounter (Signed)
Ok to follow for further fever, pain, redness of some kind or swelling

## 2019-02-28 NOTE — Telephone Encounter (Addendum)
Pt informed of below. She states she can take amoxicillin or Levaquin. She is requesting one of these.  Her temperature broke Sunday, so she states she may not need a new one.

## 2019-03-01 NOTE — Telephone Encounter (Signed)
Left message for patient to call back  

## 2019-03-02 NOTE — Telephone Encounter (Signed)
Patient is returning staceys call  Call back [phone]

## 2019-03-03 NOTE — Telephone Encounter (Signed)
Pt informed of below.  

## 2019-04-02 ENCOUNTER — Emergency Department (HOSPITAL_COMMUNITY)
Admission: EM | Admit: 2019-04-02 | Discharge: 2019-04-02 | Disposition: A | Discharge status: Home / Self Care | Payer: Medicare Other | Attending: Emergency Medicine | Admitting: Emergency Medicine

## 2019-04-02 ENCOUNTER — Encounter (HOSPITAL_COMMUNITY): Payer: Self-pay

## 2019-04-02 ENCOUNTER — Other Ambulatory Visit: Payer: Self-pay

## 2019-04-02 ENCOUNTER — Emergency Department (HOSPITAL_COMMUNITY): Payer: Medicare Other

## 2019-04-02 DIAGNOSIS — J449 Chronic obstructive pulmonary disease, unspecified: Secondary | ICD-10-CM | POA: Diagnosis not present

## 2019-04-02 DIAGNOSIS — Z79899 Other long term (current) drug therapy: Secondary | ICD-10-CM | POA: Diagnosis not present

## 2019-04-02 DIAGNOSIS — I2510 Atherosclerotic heart disease of native coronary artery without angina pectoris: Secondary | ICD-10-CM | POA: Diagnosis not present

## 2019-04-02 DIAGNOSIS — I5032 Chronic diastolic (congestive) heart failure: Secondary | ICD-10-CM | POA: Insufficient documentation

## 2019-04-02 DIAGNOSIS — Z87891 Personal history of nicotine dependence: Secondary | ICD-10-CM | POA: Insufficient documentation

## 2019-04-02 DIAGNOSIS — Z20828 Contact with and (suspected) exposure to other viral communicable diseases: Secondary | ICD-10-CM | POA: Insufficient documentation

## 2019-04-02 DIAGNOSIS — I509 Heart failure, unspecified: Secondary | ICD-10-CM

## 2019-04-02 DIAGNOSIS — E039 Hypothyroidism, unspecified: Secondary | ICD-10-CM | POA: Insufficient documentation

## 2019-04-02 DIAGNOSIS — R0602 Shortness of breath: Secondary | ICD-10-CM | POA: Diagnosis present

## 2019-04-02 LAB — BASIC METABOLIC PANEL
Anion gap: 10 (ref 5–15)
BUN: 14 mg/dL (ref 6–20)
CO2: 26 mmol/L (ref 22–32)
Calcium: 9.8 mg/dL (ref 8.9–10.3)
Chloride: 103 mmol/L (ref 98–111)
Creatinine, Ser: 0.67 mg/dL (ref 0.44–1.00)
GFR calc Af Amer: >60 mL/min (ref >60)
GFR calc non Af Amer: >60 mL/min (ref >60)
Glucose, Bld: 96 mg/dL (ref 70–99)
Potassium: 3.9 mmol/L (ref 3.5–5.1)
Sodium: 139 mmol/L (ref 135–145)

## 2019-04-02 LAB — CBC WITH DIFFERENTIAL/PLATELET
Abs Immature Granulocytes: 0.03 10*3/uL (ref 0.00–0.07)
Basophils Absolute: 0.1 10*3/uL (ref 0.0–0.1)
Basophils Relative: 1 %
Eosinophils Absolute: 0.1 10*3/uL (ref 0.0–0.5)
Eosinophils Relative: 1 %
HCT: 46.1 % — ABNORMAL HIGH (ref 36.0–46.0)
Hemoglobin: 15 g/dL (ref 12.0–15.0)
Immature Granulocytes: 0 %
Lymphocytes Relative: 19 %
Lymphs Abs: 1.8 10*3/uL (ref 0.7–4.0)
MCH: 28.6 pg (ref 26.0–34.0)
MCHC: 32.5 g/dL (ref 30.0–36.0)
MCV: 88 fL (ref 80.0–100.0)
Monocytes Absolute: 0.5 10*3/uL (ref 0.1–1.0)
Monocytes Relative: 6 %
Neutro Abs: 7.1 10*3/uL (ref 1.7–7.7)
Neutrophils Relative %: 73 %
Platelets: 231 10*3/uL (ref 150–400)
RBC: 5.24 MIL/uL — ABNORMAL HIGH (ref 3.87–5.11)
RDW: 13.8 % (ref 11.5–15.5)
WBC: 9.6 10*3/uL (ref 4.0–10.5)
nRBC: 0 % (ref 0.0–0.2)

## 2019-04-02 LAB — BRAIN NATRIURETIC PEPTIDE: B Natriuretic Peptide: 73.4 pg/mL (ref 0.0–100.0)

## 2019-04-02 MED ORDER — FUROSEMIDE 20 MG PO TABS: 20.0000 mg | ORAL_TABLET | Freq: Every day | ORAL | 0 refills | Status: DC

## 2019-04-02 NOTE — ED Triage Notes (Signed)
Pt states that she is having SHOB. Pt states she woke up at 0400. Pt states that she thought she was sounding better, but friend and sister stated she was "gasping like a guppy". Pt NAD in triage.

## 2019-04-02 NOTE — ED Provider Notes (Signed)
[facility] DEPT Provider Note   CSN: [id number] Arrival date & time: 04/02/19  1658     History   Chief Complaint Chief Complaint  Patient presents with  . Shortness of Breath    HPI Elaine Le is a 58 y.o. female.     HPI 58 year old with history of congestive heart failure, COPD comes in a chief complaint of shortness of breath.  She reports that she has been having shortness of breath type sensation since yesterday evening.  The middle night she was waking up feeling short of breath.  She denies any chest pain or exertional shortness of breath.  She denies any new swelling in her legs.  Patient also denies any cough, wheezing. Pt has no hx of PE, DVT and denies any exogenous hormone (testosterone / estrogen) use, long distance travels or surgery in the past 6 weeks, active cancer, recent immobilization.   Past Medical History:  Diagnosis Date  . Acute respiratory failure with hypercapnia ([place]) 06/21/2014  . Acute systolic heart failure ([place]) 09/2014  . Adenomatous colon polyp   . Arthritis   . Back pain   . Chronic diastolic heart failure (HCC)    related to MR/MS  . Chronic pain syndrome   . Chronic sinusitis   . CN (constipation) 05/25/2014  . Community acquired pneumonia   . COPD (chronic obstructive pulmonary disease)-PFT pending  07/17/2014  . Diverticulosis   . Fatigue   . Fibromyalgia   . GERD (gastroesophageal reflux disease)   . Headaches, cluster   . Hiatal hernia   . Hx of adenomatous colonic polyps 03/10/2011   Oct 2012, repeat colon Oct 2017   . IBS (irritable bowel syndrome) 01/21/2011  . Influenza A 06/29/2014  . Internal hemorrhoids   . Irritable bowel syndrome (IBS)   . Mitral valve regurgitation    severe  . Mitral valve stenosis, moderate 07/11/2014  . Muscle pain   . Muscular deconditioning 07/05/2014  . Night sweats   . Pleural effusion   . Pneumonia   . Protein-calorie malnutrition, severe ([place])  07/06/2014  . 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 S/P minimally invasive mitral valve replacement with bioprosthetic valve 11/08/2014   25 mm [facility] Mitral bovine bioprosthetic tissue valve placed via right mini thoracotomy approach  . Tobacco abuse     Patient Active Problem List   Diagnosis Date Noted  . Fever 02/18/2019  . Mixed hyperlipidemia 01/28/2018  . Statin intolerance 01/28/2018  . Familial hypercholesterolemia 09/24/2017  . Dyslipidemia 08/25/2017  . Dysuria 05/07/2016  . Sinusitis, acute 11/23/2015  . Routine general medical examination at a health care facility 11/23/2015  . Localized swelling of both lower legs R> L 05/07/2015  . SOB (shortness of breath) 03/08/2015  . Fatigue 03/08/2015  . Hypothyroidism 02/14/2015  . Weight gain 01/10/2015  . Nasal polyps 11/30/2014  . S/P MVR (mitral valve replacement) 11/17/2014  . S/P minimally invasive mitral valve replacement with bioprosthetic valve 11/08/2014  . Chronic diastolic heart failure ([place])   . Coronary artery disease involving native coronary artery of native heart without angina pectoris 07/28/2014  . Chronic pain syndrome   . Tobacco abuse   . Acute on chronic respiratory failure with hypoxia ([place])   . COPD (chronic obstructive pulmonary disease)-mild 07/17/2014  . 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 Hx of adenomatous colonic polyps 03/10/2011  . IBS (irritable bowel syndrome) 01/21/2011  .  GERD (gastroesophageal reflux disease) 01/21/2011  . Fibromyalgia 01/21/2011    Past Surgical History:  Procedure Laterality Date  . CYSTOSTOMY W/ BLADDER DILATION     at age 43  . LEFT AND RIGHT HEART CATHETERIZATION WITH CORONARY ANGIOGRAM N/A 07/27/2014   Procedure: LEFT AND RIGHT HEART CATHETERIZATION WITH CORONARY ANGIOGRAM;  Surgeon: Burnell Blanks, MD;  Location: [facility] CATH LAB;  Right left heart catheterization:  Mild/minimal coronary artery disease. RV: 50/7/17, PA: 48/29 (mean 38), PCWP 33 mmHg  . MITRAL VALVE REPAIR Right 11/08/2014   Procedure: MINIMALLY INVASIVE MITRAL VALVE REPLACEMENT(MVR);  Surgeon: Rexene Alberts, MD;  Location: [place];  Service: Open Heart Surgery;  Laterality: Right;  . NEUROPLASTY / TRANSPOSITION MEDIAN NERVE AT CARPAL TUNNEL BILATERAL    . TEE WITHOUT CARDIOVERSION N/A 07/10/2014   Procedure: TRANSESOPHAGEAL ECHOCARDIOGRAM (TEE);  Surgeon: Lelon Perla, MD;  Location: [facility] ENDOSCOPY;  Service: Cardiovascular;  55-60%. No regional wall motion modalities. Moderate mitral stenosis with severe regurgitation and moderate to severely dilated left atrium.  . TEE WITHOUT CARDIOVERSION N/A 11/08/2014   Procedure: TRANSESOPHAGEAL ECHOCARDIOGRAM (TEE);  Surgeon: Rexene Alberts, MD;  Location: [place];  Service: Open Heart Surgery;  Laterality: N/A;  . THORACIC OUTLET SURGERY    . TRANSTHORACIC ECHOCARDIOGRAM  07/04/2014   Normal LV Size/Fnx - EF 60-65% no RWMA; MV: thickened leaflets with doming, fixed Posterior leaflet, anterior leaflet w/ large/shaggy & mobile density.  c/w Rheumatic Mitral disease - moderate MS & Mod-Severe MR.  . TUBAL LIGATION    . VULVA SURGERY       OB History   No obstetric history on file.      Home Medications    Prior to Admission medications   Medication Sig Start Date End Date Taking? Authorizing Provider  flunisolide (NASALIDE) 25 MCG/ACT (0.025%) SOLN Place 2 sprays into the nose 2 (two) times daily. 02/22/19  Yes [provider]  ibuprofen (ADVIL) 200 MG tablet Take 400 mg by mouth every 6 (six) hours as needed for fever or moderate pain.    Yes [provider]  liothyronine (CYTOMEL) 5 MCG tablet Take 5 mcg by mouth daily.  05/13/17  Yes [provider]  SYNTHROID 75 MCG tablet Take 75 mcg by mouth daily before breakfast.  10/29/18  Yes [provider]  amoxicillin-clavulanate (AUGMENTIN) 875-125 MG tablet Take 1  tablet by mouth 2 (two) times daily. Patient not taking: Reported on 04/02/2019 11/22/18   Marrian Salvage, FNP  doxycycline (VIBRA-TABS) 100 MG tablet Take 1 tablet (100 mg total) by mouth 2 (two) times daily. Patient not taking: Reported on 04/02/2019 02/25/19   Biagio Borg, MD  fluconazole (DIFLUCAN) 150 MG tablet 1 tab by mouth every 3 days as needed Patient not taking: Reported on 04/02/2019 02/25/19   Biagio Borg, MD  furosemide (LASIX) 20 MG tablet Take 1 tablet (20 mg total) by mouth daily. 04/02/19   Varney Biles, MD  PEG-KCl-NaCl-NaSulf-Na Asc-C (PLENVU) 140 g SOLR Take 1 kit by mouth as directed. Use coupon: BIN: [id number] PNC: CNRX Group: [id number] ID: [id number] Patient not taking: Reported on 04/02/2019 06/02/18   Pyrtle, Lajuan Lines, MD  terconazole (TERAZOL 3) 0.8 % vaginal cream Place 1 applicator vaginally at bedtime. For 3 days. Patient not taking: Reported on 04/02/2019 11/22/18   Marrian Salvage, FNP  pantoprazole (PROTONIX) 40 MG tablet Take 1 tablet (40 mg total) by mouth daily. 01/21/11 01/21/12  Ladene Artist, MD    Family  History Family History  Problem Relation Age of Onset  . Kidney cancer Mother   . Colon polyps Mother   . Irritable bowel syndrome Mother   . Diabetes Sister   . Liver disease Sister   . Irritable bowel syndrome Daughter   . Diabetes Brother   . Colon cancer Neg Hx   . Thyroid disease Neg Hx     Social History Social History   Tobacco Use  . Smoking status: Former Smoker    Packs/day: 0.50    Years: 35.00    Pack years: 17.50    Types: Cigarettes    Quit date: 06/17/2014    Years since quitting: 4.8  . Smokeless tobacco: Never Used  Substance Use Topics  . Alcohol use: No    Alcohol/week: 0.0 standard drinks    Comment: rare  . Drug use: No     Allergies   Montelukast sodium, Morphine and related, Other, Statins, and Warfarin and related   Review of Systems Review of Systems  Constitutional: Positive for activity  change.  Respiratory: Positive for shortness of breath.   Cardiovascular: Negative for chest pain.  Allergic/Immunologic: Negative for immunocompromised state.  Hematological: Does not bruise/bleed easily.  All other systems reviewed and are negative.    Physical Exam Updated Vital Signs BP (!) 142/89   Pulse 88   Temp 98 F (36.7 C)   Resp 18   SpO2 99%   Physical Exam Vitals signs and nursing note reviewed.  Constitutional:      Appearance: She is well-developed.  HENT:     Head: Normocephalic and atraumatic.  Neck:     Musculoskeletal: Normal range of motion and neck supple.  Cardiovascular:     Rate and Rhythm: Normal rate.  Pulmonary:     Effort: Pulmonary effort is normal.     Breath sounds: Normal breath sounds. No decreased breath sounds, wheezing, rhonchi or rales.  Abdominal:     General: Bowel sounds are normal.  Musculoskeletal:     Right lower leg: No edema.     Left lower leg: No edema.  Skin:    General: Skin is warm and dry.  Neurological:     Mental Status: She is alert and oriented to person, place, and time.      ED Treatments / Results  Labs (all labs ordered are listed, but only abnormal results are displayed) Labs Reviewed  CBC WITH DIFFERENTIAL/PLATELET - Abnormal; Notable for the following components:      Result Value   RBC 5.24 (*)    HCT 46.1 (*)    All other components within normal limits  NOVEL CORONAVIRUS, NAA (HOSP ORDER, SEND-OUT TO REF LAB; TAT 18-24 HRS)  BASIC METABOLIC PANEL  BRAIN NATRIURETIC PEPTIDE    EKG EKG Interpretation  Date/Time:  Saturday April 02 2019 17:08:34 EST Ventricular Rate:  92 PR Interval:  160 QRS Duration: 78 QT Interval:  368 QTC Calculation: 455 R Axis:   66 Text Interpretation: Normal sinus rhythm Possible Left atrial enlargement Septal infarct , age undetermined Abnormal ECG No significant change since last tracing Confirmed by Varney Biles [phone]) on 04/02/2019 8:39:58 PM    Radiology No results found.  Procedures Procedures (including critical care time)  Medications Ordered in ED Medications - No data to display   Initial Impression / Assessment and Plan / ED Course  I have reviewed the triage vital signs and the nursing notes.  Pertinent labs & imaging results that were available during my  care of the patient were reviewed by me and considered in my medical decision making (see chart for details).        Elaine Le was evaluated in Emergency Department on 04/08/2019 for the symptoms described in the history of present illness. She was evaluated in the context of the global COVID-19 pandemic, which necessitated consideration that the patient might be at risk for infection with the SARS-CoV-2 virus that causes COVID-19. Institutional protocols and algorithms that pertain to the evaluation of patients at risk for COVID-19 are in a state of rapid change based on information released by regulatory bodies including the CDC and federal and state organizations. These policies and algorithms were followed during the patient's care in the ED.   58 year old female comes in a chief complaint of shortness of breath. She has history of CHF and valvular disease.  She also has COPD. History is not suggestive of COPD exacerbation as there is no wheezing, cough. History is more suggestive of congestive heart failure with orthopnea and paroxysmal nocturnal dyspnea-like symptoms.  However she does not have any pitting edema and her lungs are clear on exam. COVID-19 considered in the differential but unlikely as patient allegedly has been very careful not going in and out of the house.   Final Clinical Impressions(s) / ED Diagnoses   Final diagnoses:  Acute congestive heart failure, unspecified heart failure type [facility])    ED Discharge Orders         Ordered    furosemide (LASIX) 20 MG tablet  Daily     04/02/19 2213           Varney Biles, MD  04/08/19 1944

## 2019-04-02 NOTE — Discharge Instructions (Addendum)
We signed the ER for shortness of breath. Clinically it seems to is that you have congestive heart failure.  Please take Lasix as prescribed for the next 5 days and follow-up with your cardiologist or primary care doctor.  You have a pending COVID-19 test.  Please quarantine at home until your COVID-19 test results.  Additional home quarantine instructions are included with your discharge paperwork.  You should receive a call from the hospital if your test is positive.  You can also check your results and the app MyChart.  Return to the ER if you start having worsening shortness of breath.

## 2019-04-05 LAB — NOVEL CORONAVIRUS, NAA (HOSP ORDER, SEND-OUT TO REF LAB; TAT 18-24 HRS): SARS-CoV-2, NAA: NOT DETECTED

## 2019-05-11 DIAGNOSIS — Z7189 Other specified counseling: Secondary | ICD-10-CM | POA: Insufficient documentation

## 2019-05-11 NOTE — Progress Notes (Signed)
Cardiology Office Note   Date:  05/12/2019   ID:  Elaine Le, DOB 08-15-1960, MRN [id number]  PCP:  Biagio Borg, MD  Cardiologist:   Minus Breeding, MD   Chief Complaint  Patient presents with  . Shortness of Breath      History of Present Illness: Elaine Le is a 59 y.o. female who presents for follow-up after mitral valve replacement. She had probable rheumatic disease with severe regurgitation and moderate stenosis and is now status post minimally invasive valve replacement.   She had a stable replacement with bioprosthesis in Feb of last 2019.  She had an episode of shortness of breath and was in the emergency room in early December.  I reviewed these records for this appointment.  It was not clear of the etiology of this.  However, her chest x-ray was normal.  BNP was normal.  EKG was unremarkable.  She actually did not need any treatment and she said she started urinating quite a bit when she got home and she got better.  She has not been short of breath before that or since then.  She denies any palpitations, presyncope or syncope.  She said no chest pressure, neck or arm discomfort.  She has had no weight gain or edema.      Past Medical History:  Diagnosis Date  . Acute respiratory failure with hypercapnia ([place]) 06/21/2014  . Acute systolic heart failure ([place]) 09/2014  . Adenomatous colon polyp   . Arthritis   . Back pain   . Chronic diastolic heart failure (HCC)    related to MR/MS  . Chronic pain syndrome   . Chronic sinusitis   . CN (constipation) 05/25/2014  . Community acquired pneumonia   . COPD (chronic obstructive pulmonary disease)-PFT pending  07/17/2014  . Diverticulosis   . Fatigue   . Fibromyalgia   . GERD (gastroesophageal reflux disease)   . Headaches, cluster   . Hiatal hernia   . Hx of adenomatous colonic polyps 03/10/2011   Oct 2012, repeat colon Oct 2017   . IBS (irritable bowel syndrome) 01/21/2011  . Influenza A 06/29/2014   . Internal hemorrhoids   . Irritable bowel syndrome (IBS)   . Mitral valve regurgitation    severe  . Mitral valve stenosis, moderate 07/11/2014  . Muscle pain   . Muscular deconditioning 07/05/2014  . Night sweats   . Pleural effusion   . Pneumonia   . Protein-calorie malnutrition, severe ([place]) 07/06/2014  . 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 S/P minimally invasive mitral valve replacement with bioprosthetic valve 11/08/2014   25 mm [facility] Mitral bovine bioprosthetic tissue valve placed via right mini thoracotomy approach  . Tobacco abuse     Past Surgical History:  Procedure Laterality Date  . CYSTOSTOMY W/ BLADDER DILATION     at age 30  . LEFT AND RIGHT HEART CATHETERIZATION WITH CORONARY ANGIOGRAM N/A 07/27/2014   Procedure: LEFT AND RIGHT HEART CATHETERIZATION WITH CORONARY ANGIOGRAM;  Surgeon: Burnell Blanks, MD;  Location: [facility] CATH LAB;  Right left heart catheterization: Mild/minimal coronary artery disease. RV: 50/7/17, PA: 48/29 (mean 38), PCWP 33 mmHg  . MITRAL VALVE REPAIR Right 11/08/2014   Procedure: MINIMALLY INVASIVE MITRAL VALVE REPLACEMENT(MVR);  Surgeon: Rexene Alberts, MD;  Location: [place];  Service: Open Heart Surgery;  Laterality: Right;  . NEUROPLASTY / TRANSPOSITION MEDIAN NERVE AT CARPAL TUNNEL BILATERAL    .  TEE WITHOUT CARDIOVERSION N/A 07/10/2014   Procedure: TRANSESOPHAGEAL ECHOCARDIOGRAM (TEE);  Surgeon: Lelon Perla, MD;  Location: [facility] ENDOSCOPY;  Service: Cardiovascular;  55-60%. No regional wall motion modalities. Moderate mitral stenosis with severe regurgitation and moderate to severely dilated left atrium.  . TEE WITHOUT CARDIOVERSION N/A 11/08/2014   Procedure: TRANSESOPHAGEAL ECHOCARDIOGRAM (TEE);  Surgeon: Rexene Alberts, MD;  Location: [place];  Service: Open Heart Surgery;  Laterality: N/A;  . THORACIC OUTLET SURGERY    . TRANSTHORACIC ECHOCARDIOGRAM  07/04/2014    Normal LV Size/Fnx - EF 60-65% no RWMA; MV: thickened leaflets with doming, fixed Posterior leaflet, anterior leaflet w/ large/shaggy & mobile density.  c/w Rheumatic Mitral disease - moderate MS & Mod-Severe MR.  . TUBAL LIGATION    . VULVA SURGERY       Current Outpatient Medications  Medication Sig Dispense Refill  . doxycycline (VIBRA-TABS) 100 MG tablet Take 1 tablet (100 mg total) by mouth 2 (two) times daily. 20 tablet 0  . fluconazole (DIFLUCAN) 150 MG tablet 1 tab by mouth every 3 days as needed 2 tablet 1  . flunisolide (NASALIDE) 25 MCG/ACT (0.025%) SOLN Place 2 sprays into the nose 2 (two) times daily.    Marland Kitchen ibuprofen (ADVIL) 200 MG tablet Take 400 mg by mouth every 6 (six) hours as needed for fever or moderate pain.     Marland Kitchen liothyronine (CYTOMEL) 5 MCG tablet Take 5 mcg by mouth daily.   2  . SYNTHROID 75 MCG tablet Take 75 mcg by mouth daily before breakfast.     . amoxicillin-clavulanate (AUGMENTIN) 875-125 MG tablet Take 1 tablet by mouth 2 (two) times daily. (Patient not taking: Reported on 04/02/2019) 20 tablet 0  . furosemide (LASIX) 20 MG tablet Take 1 tablet (20 mg total) by mouth daily. (Patient not taking: Reported on 05/12/2019) 5 tablet 0  . PEG-KCl-NaCl-NaSulf-Na Asc-C (PLENVU) 140 g SOLR Take 1 kit by mouth as directed. Use coupon: BIN: [id number] PNC: CNRX Group: [id number] ID: [id number] (Patient not taking: Reported on 04/02/2019) 1 each 0  . terconazole (TERAZOL 3) 0.8 % vaginal cream Place 1 applicator vaginally at bedtime. For 3 days. (Patient not taking: Reported on 04/02/2019) 20 g 0   No current facility-administered medications for this visit.    Allergies:   Montelukast sodium, Morphine and related, Other, Statins, and Warfarin and related    ROS:  Please see the history of present illness.   Otherwise, review of systems are positive she has some episodes of episodic blurred vision..   All other systems are reviewed and negative.    PHYSICAL EXAM: VS:  BP  133/85   Pulse 75   Temp (!) 97 F (36.1 C)   Ht 5' (1.524 m)   Wt 121 lb 3.2 oz (55 kg)   SpO2 100%   BMI 23.67 kg/m  , BMI Body mass index is 23.67 kg/m.  GENERAL:  Well appearing NECK:  No jugular venous distention, waveform within normal limits, carotid upstroke brisk and symmetric, no bruits, no thyromegaly LUNGS:  Clear to auscultation bilaterally CHEST:  Well healed thoracotomy scar. HEART:  PMI not displaced or sustained,S1 and S2 within normal limits, no S3, no S4, no clicks, no rubs, 2 out of 6 soft late systolic murmur heard best at the axilla and apex, no diastolic murmurs ABD:  Flat, positive bowel sounds normal in frequency in pitch, no bruits, no rebound, no guarding, no midline pulsatile mass, no hepatomegaly, no splenomegaly EXT:  2  plus pulses throughout, no edema, no cyanosis no clubbing   EKG:  EKG is ordered today. Sinus rhythm, rate 75, axis within normal limits, intervals within normal limits, no acute ST-T wave changes.  Recent Labs: 11/09/2014: Magnesium 2.1 01/10/2015: ALT 27; BUN 9; Creatinine, Ser 0.70; Potassium 4.3; Sodium 139 02/01/2015: Hemoglobin 15.1*; Platelets 292.0 03/29/2015: TSH 4.03    Lipid Panel    Component Value Date/Time   TRIG 115 06/28/2014 1400      Wt Readings from Last 3 Encounters:  05/12/19 121 lb 3.2 oz (55 kg)  06/02/18 132 lb (59.9 kg)  05/19/18 128 lb 1.3 oz (58.1 kg)      Other studies Reviewed: Additional studies/ records that were reviewed today include:  ED records    ASSESSMENT AND PLAN:  MV REPAIR:    Given the episode of shortness of breath and the fact it has been a couple of years she is due for imaging and I will order an echocardiogram.  If this looks okay no further testing.  DYSLIPIDEMIA:  She has seen Dr. Debara Pickett but she is intolerant of statin and did not want the PCSK9 inhibitor.    COVID EDUCATION:  We talked about the vaccine.  She is anxious to get it.  Current medicines are reviewed at length  with the patient today.  The patient does not have concerns regarding medicines.  The following changes have been made:  None  Labs/ tests ordered today include:  None   Disposition:   FU with me in 12 months.    Signed, Minus Breeding, MD  05/12/2019 10:34 AM    [facility]

## 2019-05-12 ENCOUNTER — Encounter: Payer: Self-pay | Admitting: Cardiology

## 2019-05-12 ENCOUNTER — Ambulatory Visit (INDEPENDENT_AMBULATORY_CARE_PROVIDER_SITE_OTHER): Payer: Medicare Other | Admitting: Cardiology

## 2019-05-12 ENCOUNTER — Other Ambulatory Visit: Payer: Self-pay

## 2019-05-12 VITALS — BP 133/85 | HR 75 | Temp 97.0°F | Ht 60.0 in | Wt 121.2 lb

## 2019-05-12 DIAGNOSIS — E785 Hyperlipidemia, unspecified: Secondary | ICD-10-CM

## 2019-05-12 DIAGNOSIS — Z952 Presence of prosthetic heart valve: Secondary | ICD-10-CM

## 2019-05-12 DIAGNOSIS — Z7189 Other specified counseling: Secondary | ICD-10-CM | POA: Diagnosis not present

## 2019-05-12 DIAGNOSIS — R0602 Shortness of breath: Secondary | ICD-10-CM | POA: Diagnosis not present

## 2019-05-12 NOTE — Patient Instructions (Addendum)
Medication Instructions:  NO CHANGES *If you need a refill on your cardiac medications before your next appointment, please call your pharmacy*  Lab Work: NONE If you have labs (blood work) drawn today and your tests are completely normal, you will receive your results only by: Marland Kitchen MyChart Message (if you have MyChart) OR . A paper copy in the mail If you have any lab test that is abnormal or we need to change your treatment, we will call you to review the results.  Testing/Procedures: Your physician has requested that you have an echocardiogram. Echocardiography is a painless test that uses sound waves to create images of your heart. It provides your doctor with information about the size and shape of your heart and how well your heart's chambers and valves are working. This procedure takes approximately one hour. There are no restrictions for this procedure. [place]  Follow-Up: At [facility], you and your health needs are our priority.  As part of our continuing mission to provide you with exceptional heart care, we have created designated Provider Care Teams.  These Care Teams include your primary Cardiologist (physician) and Advanced Practice Providers (APPs -  Physician Assistants and Nurse Practitioners) who all work together to provide you with the care you need, when you need it.  Your next appointment:   1 year(s)  You will receive a reminder letter in the mail two months in advance. If you don't receive a letter, please call our office to schedule the follow-up appointment.   The format for your next appointment:   In Person  Provider:   Minus Breeding, MD

## 2019-05-17 NOTE — Addendum Note (Signed)
Addended by: Ricci Barker on: 05/17/2019 10:19 AM   Modules accepted: Orders

## 2019-05-27 ENCOUNTER — Ambulatory Visit (HOSPITAL_COMMUNITY): Payer: Medicare Other | Attending: Cardiology

## 2019-05-27 ENCOUNTER — Other Ambulatory Visit: Payer: Self-pay

## 2019-05-27 ENCOUNTER — Encounter (INDEPENDENT_AMBULATORY_CARE_PROVIDER_SITE_OTHER): Payer: Self-pay

## 2019-05-27 DIAGNOSIS — Z952 Presence of prosthetic heart valve: Secondary | ICD-10-CM | POA: Diagnosis present

## 2019-12-09 ENCOUNTER — Other Ambulatory Visit: Payer: Self-pay

## 2019-12-09 ENCOUNTER — Ambulatory Visit (INDEPENDENT_AMBULATORY_CARE_PROVIDER_SITE_OTHER): Payer: Medicare Other | Admitting: Family

## 2019-12-09 VITALS — BP 128/74 | HR 88 | Temp 98.3°F | Ht 60.0 in | Wt 121.0 lb

## 2019-12-09 DIAGNOSIS — N390 Urinary tract infection, site not specified: Secondary | ICD-10-CM

## 2019-12-09 DIAGNOSIS — R87619 Unspecified abnormal cytological findings in specimens from cervix uteri: Secondary | ICD-10-CM

## 2019-12-09 LAB — POC URINALSYSI DIPSTICK (AUTOMATED)
Bilirubin, UA: NEGATIVE
Blood, UA: NEGATIVE
Glucose, UA: NEGATIVE
Ketones, UA: NEGATIVE
Leukocytes, UA: NEGATIVE
Nitrite, UA: NEGATIVE
Protein, UA: NEGATIVE
Spec Grav, UA: 1.01 (ref 1.010–1.025)
Urobilinogen, UA: 0.2 E.U./dL
pH, UA: 6.5 (ref 5.0–8.0)

## 2019-12-09 MED ORDER — TERCONAZOLE 0.8 % VA CREA: 1.0000 | TOPICAL_CREAM | Freq: Every day | VAGINAL | 0 refills | Status: DC

## 2019-12-09 MED ORDER — FLUCONAZOLE 150 MG PO TABS: ORAL_TABLET | ORAL | 1 refills | Status: DC

## 2019-12-09 MED ORDER — SULFAMETHOXAZOLE-TRIMETHOPRIM 800-160 MG PO TABS: 1.0000 | ORAL_TABLET | Freq: Two times a day (BID) | ORAL | 0 refills | Status: DC

## 2019-12-09 NOTE — Addendum Note (Signed)
Addended by: Marcina Millard on: 12/09/2019 04:36 PM   Modules accepted: Orders

## 2019-12-09 NOTE — Progress Notes (Signed)
Elaine Le is a 59 y.o. female with the following history as recorded in EpicCare:  Patient Active Problem List   Diagnosis Date Noted  . Educated about COVID-19 virus infection 05/11/2019  . Fever 02/18/2019  . Mixed hyperlipidemia 01/28/2018  . Statin intolerance 01/28/2018  . Familial hypercholesterolemia 09/24/2017  . Dyslipidemia 08/25/2017  . Dysuria 05/07/2016  . Sinusitis, acute 11/23/2015  . Routine general medical examination at a health care facility 11/23/2015  . Localized swelling of both lower legs R> L 05/07/2015  . SOB (shortness of breath) 03/08/2015  . Fatigue 03/08/2015  . Hypothyroidism 02/14/2015  . Weight gain 01/10/2015  . Nasal polyps 11/30/2014  . S/P MVR (mitral valve replacement) 11/17/2014  . S/P minimally invasive mitral valve replacement with bioprosthetic valve 11/08/2014  . Chronic diastolic heart failure ([place])   . Coronary artery disease involving native coronary artery of native heart without angina pectoris 07/28/2014  . Chronic pain syndrome   . Tobacco abuse   . Acute on chronic respiratory failure with hypoxia ([place])   . COPD (chronic obstructive pulmonary disease)-mild 07/17/2014  . 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 Hx of adenomatous colonic polyps 03/10/2011  . IBS (irritable bowel syndrome) 01/21/2011  . GERD (gastroesophageal reflux disease) 01/21/2011  . Fibromyalgia 01/21/2011    Current Outpatient Medications  Medication Sig Dispense Refill  . fluconazole (DIFLUCAN) 150 MG tablet 1 tab by mouth every 3 days as needed 2 tablet 1  . furosemide (LASIX) 20 MG tablet Take 1 tablet (20 mg total) by mouth daily. 5 tablet 0  . ibuprofen (ADVIL) 200 MG tablet Take 400 mg by mouth every 6 (six) hours as needed for fever or moderate pain.     Marland Kitchen liothyronine (CYTOMEL) 5 MCG tablet Take 5 mcg by mouth daily.   2  . PEG-KCl-NaCl-NaSulf-Na Asc-C (PLENVU) 140 g SOLR Take 1 kit by mouth as directed. Use coupon: BIN: [id number] PNC:  CNRX Group: [id number] ID: [id number] 1 each 0  . SYNTHROID 75 MCG tablet Take 75 mcg by mouth daily before breakfast.     . betamethasone dipropionate 0.05 % cream Apply topically.    . flunisolide (NASALIDE) 25 MCG/ACT (0.025%) SOLN Place 2 sprays into the nose 2 (two) times daily. (Patient not taking: Reported on 12/09/2019)    . ketoconazole (NIZORAL) 2 % cream Apply topically every morning.    Marland Kitchen ketoconazole (NIZORAL) 2 % shampoo Apply topically.    . sulfamethoxazole-trimethoprim (BACTRIM DS) 800-160 MG tablet Take 1 tablet by mouth 2 (two) times daily. 10 tablet 0  . terconazole (TERAZOL 3) 0.8 % vaginal cream Place 1 applicator vaginally at bedtime. For 3 days. 20 g 0   No current facility-administered medications for this visit.    Allergies: Montelukast sodium, Morphine and related, Other, Statins, and Warfarin and related  Past Medical History:  Diagnosis Date  . Acute respiratory failure with hypercapnia ([place]) 06/21/2014  . Acute systolic heart failure ([place]) 09/2014  . Adenomatous colon polyp   . Arthritis   . Back pain   . Chronic diastolic heart failure (H[facility])    related to MR/MS  . Chronic pain syndrome   . Chronic sinusitis   . CN (constipation) 05/25/2014  . Community acquired pneumonia   . COPD (chronic obstructive pulmonary disease)-PFT pending  07/17/2014  . Diverticulosis   . Fatigue   . Fibromyalgia   . GERD (gastroesophageal reflux disease)   . Headaches, cluster   . Hiatal hernia   .  Hx of adenomatous colonic polyps 03/10/2011   Oct 2012, repeat colon Oct 2017   . IBS (irritable bowel syndrome) 01/21/2011  . Influenza A 06/29/2014  . Internal hemorrhoids   . Irritable bowel syndrome (IBS)   . Mitral valve regurgitation    severe  . Mitral valve stenosis, moderate 07/11/2014  . Muscle pain   . Muscular deconditioning 07/05/2014  . Night sweats   . Pleural effusion   . Pneumonia   . Protein-calorie malnutrition, severe ([place]) 07/06/2014  . Rheumatic disease of  mitral valve 07/10/2014   Severe mitral regurgitation with moderate mitral stenosis  . S/P minimally invasive mitral valve replacement with bioprosthetic valve 11/08/2014   25 mm [facility] Mitral bovine bioprosthetic tissue valve placed via right mini thoracotomy approach  . Tobacco abuse     Past Surgical History:  Procedure Laterality Date  . CYSTOSTOMY W/ BLADDER DILATION     at age 58  . LEFT AND RIGHT HEART CATHETERIZATION WITH CORONARY ANGIOGRAM N/A 07/27/2014   Procedure: LEFT AND RIGHT HEART CATHETERIZATION WITH CORONARY ANGIOGRAM;  Surgeon: Burnell Blanks, MD;  Location: [facility] CATH LAB;  Right left heart catheterization: Mild/minimal coronary artery disease. RV: 50/7/17, PA: 48/29 (mean 38), PCWP 33 mmHg  . MITRAL VALVE REPAIR Right 11/08/2014   Procedure: MINIMALLY INVASIVE MITRAL VALVE REPLACEMENT(MVR);  Surgeon: Rexene Alberts, MD;  Location: [facility];  Service: Open Heart Surgery;  Laterality: Right;  . NEUROPLASTY / TRANSPOSITION MEDIAN NERVE AT CARPAL TUNNEL BILATERAL    . TEE WITHOUT CARDIOVERSION N/A 07/10/2014   Procedure: TRANSESOPHAGEAL ECHOCARDIOGRAM (TEE);  Surgeon: Lelon Perla, MD;  Location[facility] ENDOSCOPY;  Service: Cardiovascular;  55-60%. No regional wall motion modalities. Moderate mitral stenosis with severe regurgitation and moderate to severely dilated left atrium.  . TEE WITHOUT CARDIOVERSION N/A 11/08/2014   Procedure: TRANSESOPHAGEAL ECHOCARDIOGRAM (TEE);  Surgeon: Rexene Alberts, MD;  Location: [place];  Service: Open Heart Surgery;  Laterality: N/A;  . THORACIC OUTLET SURGERY    . TRANSTHORACIC ECHOCARDIOGRAM  07/04/2014   Normal LV Size/Fnx - EF 60-65% no RWMA; MV: thickened leaflets with doming, fixed Posterior leaflet, anterior leaflet w/ large/shaggy & mobile density.  c/w Rheumatic Mitral disease - moderate MS & Mod-Severe MR.  . TUBAL LIGATION    . VULVA SURGERY      Family History  Problem Relation Age of Onset  . Kidney cancer Mother   . Colon  polyps Mother   . Irritable bowel syndrome Mother   . Diabetes Sister   . Liver disease Sister   . Irritable bowel syndrome Daughter   . Diabetes Brother   . Colon cancer Neg Hx   . Thyroid disease Neg Hx     Social History   Tobacco Use  . Smoking status: Former Smoker    Packs/day: 0.50    Years: 35.00    Pack years: 17.50    Types: Cigarettes    Quit date: 06/17/2014    Years since quitting: 5.4  . Smokeless tobacco: Never Used  Substance Use Topics  . Alcohol use: No    Alcohol/week: 0.0 standard drinks    Comment: rare    Subjective:  Patient presents with concerns for UTI; symptoms x 1 week; + burning, frequency, "cloudy urine." Feels like she has been running a low grade fever;  Also requesting refill on Diflucan/ Terazol;      Objective:  Vitals:   12/09/19 1441  BP: 128/74  Pulse: 88  Temp: 98.3 F (  36.8 C)  TempSrc: Oral  SpO2: 98%  Weight: 121 lb (54.9 kg)  Height: 5' (1.524 m)    General: Well developed, well nourished, in no acute distress  Head: Normocephalic and atraumatic  Lungs: Respirations unlabored; clear to auscultation bilaterally without wheeze, rales, rhonchi  CVS exam: normal rate and regular rhythm.  Neurologic: Alert and oriented; speech intact; face symmetrical; moves all extremities well; CNII-XII intact without focal deficit   Assessment:  1. Urinary tract infection without hematuria, site unspecified   2. Abnormal cervical Papanicolaou smear, unspecified abnormal pap finding     Plan:  1. Check urine culture; Rx for Macrobid 100 mg bid x 7 days; 2. Refer to GYN;  This visit occurred during the SARS-CoV-2 public health emergency.  Safety protocols were in place, including screening questions prior to the visit, additional usage of staff PPE, and extensive cleaning of exam room while observing appropriate contact time as indicated for disinfecting solutions.     No follow-ups on file.  Orders Placed This Encounter  Procedures   . Urine Culture    Standing Status:   Future    Number of Occurrences:   1    Standing Expiration Date:   12/08/2020  . Ambulatory referral to Gynecology    Referral Priority:   Routine    Referral Type:   Consultation    Referral Reason:   Specialty Services Required    Requested Specialty:   Gynecology    Number of Visits Requested:   1    Requested Prescriptions   Signed Prescriptions Disp Refills  . fluconazole (DIFLUCAN) 150 MG tablet 2 tablet 1    Sig: 1 tab by mouth every 3 days as needed  . terconazole (TERAZOL 3) 0.8 % vaginal cream 20 g 0    Sig: Place 1 applicator vaginally at bedtime. For 3 days.  Marland Kitchen sulfamethoxazole-trimethoprim (BACTRIM DS) 800-160 MG tablet 10 tablet 0    Sig: Take 1 tablet by mouth 2 (two) times daily.

## 2019-12-10 LAB — URINE CULTURE: Result:: NO GROWTH

## 2020-01-26 ENCOUNTER — Other Ambulatory Visit: Payer: Self-pay | Admitting: Obstetrics & Gynecology

## 2020-01-26 DIAGNOSIS — Z1231 Encounter for screening mammogram for malignant neoplasm of breast: Secondary | ICD-10-CM

## 2020-02-15 ENCOUNTER — Encounter: Payer: Medicare Other | Admitting: Obstetrics and Gynecology

## 2020-02-21 ENCOUNTER — Ambulatory Visit
Admission: RE | Admit: 2020-02-21 | Discharge: 2020-02-21 | Disposition: A | Discharge status: Home / Self Care | Payer: Medicare Other | Source: Ambulatory Visit | Attending: Obstetrics & Gynecology | Admitting: Obstetrics & Gynecology

## 2020-02-21 ENCOUNTER — Other Ambulatory Visit: Payer: Self-pay

## 2020-02-21 DIAGNOSIS — Z1231 Encounter for screening mammogram for malignant neoplasm of breast: Secondary | ICD-10-CM

## 2020-03-12 ENCOUNTER — Encounter: Payer: Medicare Other | Admitting: Obstetrics and Gynecology

## 2020-04-26 DIAGNOSIS — R09A2 Foreign body sensation, throat: Secondary | ICD-10-CM | POA: Insufficient documentation

## 2020-04-26 DIAGNOSIS — R198 Other specified symptoms and signs involving the digestive system and abdomen: Secondary | ICD-10-CM | POA: Insufficient documentation

## 2020-04-26 DIAGNOSIS — R221 Localized swelling, mass and lump, neck: Secondary | ICD-10-CM | POA: Insufficient documentation

## 2020-04-26 DIAGNOSIS — R0989 Other specified symptoms and signs involving the circulatory and respiratory systems: Secondary | ICD-10-CM | POA: Insufficient documentation

## 2020-04-26 DIAGNOSIS — G479 Sleep disorder, unspecified: Secondary | ICD-10-CM | POA: Insufficient documentation

## 2020-05-02 ENCOUNTER — Other Ambulatory Visit: Payer: Self-pay | Admitting: Otolaryngology

## 2020-05-02 DIAGNOSIS — R221 Localized swelling, mass and lump, neck: Secondary | ICD-10-CM

## 2020-05-14 ENCOUNTER — Ambulatory Visit: Payer: Medicare Other | Admitting: Cardiology

## 2020-05-18 ENCOUNTER — Other Ambulatory Visit: Payer: Medicare Other

## 2020-05-28 ENCOUNTER — Encounter: Payer: Self-pay | Admitting: Obstetrics and Gynecology

## 2020-05-28 ENCOUNTER — Other Ambulatory Visit (HOSPITAL_COMMUNITY)
Admission: RE | Admit: 2020-05-28 | Discharge: 2020-05-28 | Disposition: A | Discharge status: Home / Self Care | Payer: Medicare Other | Source: Ambulatory Visit | Attending: Obstetrics and Gynecology | Admitting: Obstetrics and Gynecology

## 2020-05-28 ENCOUNTER — Other Ambulatory Visit: Payer: Self-pay

## 2020-05-28 ENCOUNTER — Ambulatory Visit (INDEPENDENT_AMBULATORY_CARE_PROVIDER_SITE_OTHER): Payer: Medicare Other | Admitting: Obstetrics and Gynecology

## 2020-05-28 VITALS — BP 126/66 | HR 83 | Wt 117.7 lb

## 2020-05-28 DIAGNOSIS — Z0000 Encounter for general adult medical examination without abnormal findings: Secondary | ICD-10-CM

## 2020-05-28 DIAGNOSIS — N898 Other specified noninflammatory disorders of vagina: Secondary | ICD-10-CM | POA: Diagnosis not present

## 2020-05-28 DIAGNOSIS — Z01419 Encounter for gynecological examination (general) (routine) without abnormal findings: Secondary | ICD-10-CM

## 2020-05-28 DIAGNOSIS — Z1151 Encounter for screening for human papillomavirus (HPV): Secondary | ICD-10-CM | POA: Insufficient documentation

## 2020-05-28 DIAGNOSIS — Z124 Encounter for screening for malignant neoplasm of cervix: Secondary | ICD-10-CM | POA: Insufficient documentation

## 2020-05-28 NOTE — Progress Notes (Signed)
GYNECOLOGY ANNUAL PREVENTATIVE CARE ENCOUNTER NOTE  Subjective:   Elaine Le is a 60 y.o. female here for a annual gynecologic exam. Current complaints: needs pap. Last pap 3 years ago, was told she needs to have pap yearly. Does have vaginal dryness. Has some swelling due to chafing. Has tried estrace in the past and is not interested in trying again.   Has had laser therapy for abnormal cells for vulvar issue in remote past, has had multiple biopsies. No acute issues.  Denies abnormal vaginal bleeding, discharge, pelvic pain, problems with intercourse or other gynecologic concerns. Declines STI screen.   Gynecologic History No LMP recorded. Patient is postmenopausal. menopause 9-10 years ago Contraception: post menopausal status Last Pap: 3 years ago. Results: normal, remote h/o abnormal pap Last mammogram: 01/2020. Results: [place] 1  Obstetric History OB History  Gravida Para Term Preterm AB Living  1 1 1     1   SAB IAB Ectopic Multiple Live Births          1    # Outcome Date GA Lbr Len/2nd Weight Sex Delivery Anes PTL Lv  1 Term      Vag-Spont   LIV    Past Medical History:  Diagnosis Date  . Acute respiratory failure with hypercapnia ([place]) 06/21/2014  . Acute systolic heart failure ([place]) 09/2014  . Adenomatous colon polyp   . Arthritis   . Back pain   . Chronic diastolic heart failure (HCC)    related to MR/MS  . Chronic pain syndrome   . Chronic sinusitis   . CN (constipation) 05/25/2014  . Community acquired pneumonia   . COPD (chronic obstructive pulmonary disease)-PFT pending  07/17/2014  . Diverticulosis   . Fatigue   . Fibromyalgia   . GERD (gastroesophageal reflux disease)   . Headaches, cluster   . Hiatal hernia   . Hx of adenomatous colonic polyps 03/10/2011   Oct 2012, repeat colon Oct 2017   . IBS (irritable bowel syndrome) 01/21/2011  . Influenza A 06/29/2014  . Internal hemorrhoids   . Irritable bowel syndrome (IBS)   . Mitral valve  regurgitation    severe  . Mitral valve stenosis, moderate 07/11/2014  . Muscle pain   . Muscular deconditioning 07/05/2014  . Night sweats   . Pleural effusion   . Pneumonia   . Protein-calorie malnutrition, severe ([place]) 07/06/2014  . 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 S/P minimally invasive mitral valve replacement with bioprosthetic valve 11/08/2014   25 mm [facility] Mitral bovine bioprosthetic tissue valve placed via right mini thoracotomy approach  . Tobacco abuse     Past Surgical History:  Procedure Laterality Date  . CYSTOSTOMY W/ BLADDER DILATION     at age 61  . LEFT AND RIGHT HEART CATHETERIZATION WITH CORONARY ANGIOGRAM N/A 07/27/2014   Procedure: LEFT AND RIGHT HEART CATHETERIZATION WITH CORONARY ANGIOGRAM;  Surgeon: Burnell Blanks, MD;  Location: [facility] CATH LAB;  Right left heart catheterization: Mild/minimal coronary artery disease. RV: 50/7/17, PA: 48/29 (mean 38), PCWP 33 mmHg  . MITRAL VALVE REPAIR Right 11/08/2014   Procedure: MINIMALLY INVASIVE MITRAL VALVE REPLACEMENT(MVR);  Surgeon: Rexene Alberts, MD;  Location: [place];  Service: Open Heart Surgery;  Laterality: Right;  . NEUROPLASTY / TRANSPOSITION MEDIAN NERVE AT CARPAL TUNNEL BILATERAL    . TEE WITHOUT CARDIOVERSION N/A 07/10/2014   Procedure: TRANSESOPHAGEAL ECHOCARDIOGRAM (TEE);  Surgeon: Lelon Perla,  MD;  Location: [place];  Service: Cardiovascular;  55-60%. No regional wall motion modalities. Moderate mitral stenosis with severe regurgitation and moderate to severely dilated left atrium.  . TEE WITHOUT CARDIOVERSION N/A 11/08/2014   Procedure: TRANSESOPHAGEAL ECHOCARDIOGRAM (TEE);  Surgeon: Rexene Alberts, MD;  Location: [place];  Service: Open Heart Surgery;  Laterality: N/A;  . THORACIC OUTLET SURGERY    . TRANSTHORACIC ECHOCARDIOGRAM  07/04/2014   Normal LV Size/Fnx - EF 60-65% no RWMA; MV: thickened leaflets with doming,  fixed Posterior leaflet, anterior leaflet w/ large/shaggy & mobile density.  c/w Rheumatic Mitral disease - moderate MS & Mod-Severe MR.  . TUBAL LIGATION    . VULVA SURGERY      Current Outpatient Medications on File Prior to Visit  Medication Sig Dispense Refill  . azithromycin (ZITHROMAX) 250 MG tablet Take 250 mg by mouth 3 (three) times a week.    . betamethasone dipropionate 0.05 % cream Apply topically.    . clobetasol (TEMOVATE) 0.05 % external solution     . EUCRISA 2 % OINT Apply 1 a small amount to skin twice a day  hands and neck    . FINACEA 15 % FOAM Apply topically.    Marland Kitchen ketoconazole (NIZORAL) 2 % cream Apply topically every morning.    Marland Kitchen ketoconazole (NIZORAL) 2 % shampoo Apply topically.    Marland Kitchen liothyronine (CYTOMEL) 5 MCG tablet Take 5 mcg by mouth daily.   2  . metroNIDAZOLE (METROCREAM) 0.75 % cream Apply 1 application topically 2 (two) times daily.    Marland Kitchen SYNTHROID 75 MCG tablet Take 75 mcg by mouth daily before breakfast.     . triamcinolone (KENALOG) 0.1 % Apply topically 3 (three) times daily as needed.    . fluconazole (DIFLUCAN) 150 MG tablet 1 tab by mouth every 3 days as needed (Patient not taking: Reported on 05/28/2020) 2 tablet 1  . flunisolide (NASALIDE) 25 MCG/ACT (0.025%) SOLN Place 2 sprays into the nose 2 (two) times daily. (Patient not taking: No sig reported)    . furosemide (LASIX) 20 MG tablet Take 1 tablet (20 mg total) by mouth daily. (Patient not taking: Reported on 05/28/2020) 5 tablet 0  . ibuprofen (ADVIL) 200 MG tablet Take 400 mg by mouth every 6 (six) hours as needed for fever or moderate pain.  (Patient not taking: Reported on 05/28/2020)    . terconazole (TERAZOL 3) 0.8 % vaginal cream Place 1 applicator vaginally at bedtime. For 3 days. (Patient not taking: Reported on 05/28/2020) 20 g 0  . [DISCONTINUED] pantoprazole (PROTONIX) 40 MG tablet Take 1 tablet (40 mg total) by mouth daily. 30 tablet 11   No current facility-administered medications on  file prior to visit.    Allergies  Allergen Reactions  . Montelukast Sodium     Intolerance - effects her mood   . Morphine And Related Other (See Comments)    Altered mental state Altered mental state  . Other Other (See Comments)    Any antidepressants per pt- causes altered mental state, anger  . Statins Other (See Comments)    Myalgias with several statins  . Warfarin And Related     Social History   Socioeconomic History  . Marital status: Single    Spouse name: Not on file  . Number of children: 1  . Years of education: Not on file  . Highest education level: Not on file  Occupational History  . Occupation: disabled  Tobacco Use  . Smoking status:  Former Smoker    Packs/day: 0.50    Years: 35.00    Pack years: 17.50    Types: Cigarettes    Quit date: 06/17/2014    Years since quitting: 5.9  . Smokeless tobacco: Never Used  Substance and Sexual Activity  . Alcohol use: No    Alcohol/week: 0.0 standard drinks    Comment: rare  . Drug use: No  . Sexual activity: Never  Other Topics Concern  . Not on file  Social History Narrative  . Not on file   Social Determinants of Health   Financial Resource Strain: Not on file  Food Insecurity: Not on file  Transportation Needs: Not on file  Physical Activity: Not on file  Stress: Not on file  Social Connections: Not on file  Intimate Partner Violence: Not on file    Family History  Problem Relation Age of Onset  . Kidney cancer Mother   . Colon polyps Mother   . Irritable bowel syndrome Mother   . Diabetes Sister   . Liver disease Sister   . Irritable bowel syndrome Daughter   . Diabetes Brother   . Colon cancer Neg Hx   . Thyroid disease Neg Hx     The following portions of the patient's history were reviewed and updated as appropriate: allergies, current medications, past family history, past medical history, past social history, past surgical history and problem list.  Review of Systems Pertinent  items are noted in HPI.   Objective:  BP 126/66   Pulse 83   Wt 117 lb 11.2 oz (53.4 kg)   BMI 22.99 kg/m  CONSTITUTIONAL: Well-developed, well-nourished female in no acute distress.  HENT:  Normocephalic, atraumatic, External right and left ear normal. Oropharynx is clear and moist EYES: Conjunctivae and EOM are normal. Pupils are equal, round, and reactive to light. No scleral icterus.  NECK: Normal range of motion, supple, no masses.  Normal thyroid.  SKIN: Skin is warm and dry. No rash noted. Not diaphoretic. No erythema. No pallor. NEUROLOGIC: Alert and oriented to person, place, and time. Normal reflexes, muscle tone coordination. No cranial nerve deficit noted. PSYCHIATRIC: Normal mood and affect. Normal behavior. Normal judgment and thought content. CARDIOVASCULAR: Normal heart rate noted RESPIRATORY: Effort normal, no problems with respiration noted. BREASTS: deferred ABDOMEN: Soft, no distention noted.  No tenderness, rebound or guarding.  PELVIC: Normal and somewhat atrophic appearing external genitalia; normal appearing vaginal mucosa and cervix.  No abnormal discharge noted.  Pap smear obtained. Normal uterine size, no other palpable masses, no uterine or adnexal tenderness. MUSCULOSKELETAL: Normal range of motion. No tenderness.  No cyanosis, clubbing, or edema.  2+ distal pulses.  Exam done with chaperone present.  FRAX score done 05/28/20: BMI: 22.9 The ten year probability of fracture (%) without BMD Major osteoporotic: 7.3 Hip Fracture: 0.7  Assessment and Plan:   1. Well woman exam Normal post menopausal female exam  2. Cervical cancer screening - Cytology - PAP( [facility])  3. Vaginal dryness Has tried estrace in past with no improvement Try vmagic for dryness   Will follow up results of pap smear and manage accordingly. Encouraged improvement in diet and exercise.  COVID vaccine UTD Declines STI screen. Mammogram UTD Referral for colonoscopy  declines Flu vaccine declines DEXA not due based on FRAX & age  Routine preventative health maintenance measures emphasized. Please refer to After Visit Summary for other counseling recommendations.    Feliz Beam, MD, [place] for [facility]  [facility]care [employer] farm manager)

## 2020-05-29 LAB — CYTOLOGY - PAP
Comment: NEGATIVE
Diagnosis: NEGATIVE
High risk HPV: NEGATIVE

## 2020-05-30 ENCOUNTER — Ambulatory Visit
Admission: RE | Admit: 2020-05-30 | Discharge: 2020-05-30 | Disposition: A | Discharge status: Home / Self Care | Payer: Medicare Other | Source: Ambulatory Visit | Attending: Otolaryngology | Admitting: Otolaryngology

## 2020-05-30 ENCOUNTER — Other Ambulatory Visit: Payer: Self-pay

## 2020-05-30 DIAGNOSIS — R221 Localized swelling, mass and lump, neck: Secondary | ICD-10-CM

## 2020-05-30 MED ORDER — IOPAMIDOL (ISOVUE-300) INJECTION 61%
75.0000 mL | Freq: Once | INTRAVENOUS | Status: AC | PRN
  Administered 2020-05-30: 75 mL via INTRAVENOUS

## 2020-06-07 ENCOUNTER — Telehealth: Payer: Self-pay

## 2020-06-07 NOTE — Telephone Encounter (Signed)
Left detailed message on pt's VM with negative Pap results per Dr Rosana Hoes. Pt advised can return call to office with any questions or concerns.  Encounter closed

## 2020-06-07 NOTE — Telephone Encounter (Signed)
-----   Message from Shelly Coss, RN sent at 06/07/2020  4:53 PM EST -----  ----- Message ----- From: Lyndal Rainbow, CMA Sent: 06/01/2020  11:44 AM EST To: Shelly Coss, RN   ----- Message ----- From: Sloan Leiter, MD Sent: 05/31/2020   9:11 AM EST To: [place]  Please call and let patient know normal pap, repeat 3 years

## 2020-06-13 DIAGNOSIS — Z8249 Family history of ischemic heart disease and other diseases of the circulatory system: Secondary | ICD-10-CM | POA: Insufficient documentation

## 2020-06-13 NOTE — Progress Notes (Signed)
Cardiology Office Note   Date:  06/14/2020   ID:  Elaine Le, DOB [birth year], MRN [id number]  PCP:  Biagio Borg, MD  Cardiologist:   Minus Breeding, MD   Chief Complaint  Patient presents with  . MITRAL VALVE REPAIR      History of Present Illness: Elaine Le is a 60 y.o. female who presents for follow-up after mitral valve replacement. She had probable rheumatic disease with severe regurgitation and moderate stenosis and is now status post minimally invasive valve replacement.   She had a stable replacement with bioprosthesis in Jan 2021.  Since I last saw her she is doing quite well since I saw her.  She does want to sleep during the day and does not sleep well at night.  She wears CPAP.  She is not having any cardiovascular complaints.  She walked for a couple of hours the other day and did not have problems.The patient denies any new symptoms such as chest discomfort, neck or arm discomfort. There has been no new shortness of breath, PND or orthopnea. There have been no reported palpitations, presyncope or syncope.   Past Medical History:  Diagnosis Date  . Acute respiratory failure with hypercapnia ([place]) 06/21/2014  . Acute systolic heart failure ([place]) 09/2014  . Adenomatous colon polyp   . Arthritis   . Back pain   . Chronic diastolic heart failure (HCC)    related to MR/MS  . Chronic pain syndrome   . Chronic sinusitis   . CN (constipation) 05/25/2014  . Community acquired pneumonia   . COPD (chronic obstructive pulmonary disease)-PFT pending  07/17/2014  . Diverticulosis   . Fatigue   . Fibromyalgia   . GERD (gastroesophageal reflux disease)   . Headaches, cluster   . Hiatal hernia   . Hx of adenomatous colonic polyps 03/10/2011   Oct 2012, repeat colon Oct 2017   . IBS (irritable bowel syndrome) 01/21/2011  . Influenza A 06/29/2014  . Internal hemorrhoids   . Irritable bowel syndrome (IBS)   . Mitral valve regurgitation    severe  . Mitral  valve stenosis, moderate 07/11/2014  . Muscle pain   . Muscular deconditioning 07/05/2014  . Night sweats   . Pleural effusion   . Pneumonia   . Protein-calorie malnutrition, severe ([place]) 07/06/2014  . 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 S/P minimally invasive mitral valve replacement with bioprosthetic valve 11/08/2014   25 mm [facility] Mitral bovine bioprosthetic tissue valve placed via right mini thoracotomy approach  . Tobacco abuse     Past Surgical History:  Procedure Laterality Date  . CYSTOSTOMY W/ BLADDER DILATION     at age 33  . LEFT AND RIGHT HEART CATHETERIZATION WITH CORONARY ANGIOGRAM N/A 07/27/2014   Procedure: LEFT AND RIGHT HEART CATHETERIZATION WITH CORONARY ANGIOGRAM;  Surgeon: Burnell Blanks, MD;  Location[facility] C[facility] LAB;  Right left heart catheterization: Mild/minimal coronary artery disease. RV: 50/7/17, PA: 48/29 (mean 38), PCWP 33 mmHg  . MITRAL VALVE REPAIR Right 11/08/2014   Procedure: MINIMALLY INVASIVE MITRAL VALVE REPLACEMENT(MVR);  Surgeon: Rexene Alberts, MD;  Location: [place];  Service: Open Heart Surgery;  Laterality: Right;  . NEUROPLASTY / TRANSPOSITION MEDIAN NERVE AT CARPAL TUNNEL BILATERAL    . TEE WITHOUT CARDIOVERSION N/A 07/10/2014   Procedure: TRANSESOPHAGEAL ECHOCARDIOGRAM (TEE);  Surgeon: Lelon Perla, MD;  Location: [facility] ENDOSCOPY;  Service: Cardiovascular;  55-60%.  No regional wall motion modalities. Moderate mitral stenosis with severe regurgitation and moderate to severely dilated left atrium.  . TEE WITHOUT CARDIOVERSION N/A 11/08/2014   Procedure: TRANSESOPHAGEAL ECHOCARDIOGRAM (TEE);  Surgeon: Rexene Alberts, MD;  Location: [place];  Service: Open Heart Surgery;  Laterality: N/A;  . THORACIC OUTLET SURGERY    . TRANSTHORACIC ECHOCARDIOGRAM  07/04/2014   Normal LV Size/Fnx - EF 60-65% no RWMA; MV: thickened leaflets with doming, fixed Posterior leaflet, anterior  leaflet w/ large/shaggy & mobile density.  c/w Rheumatic Mitral disease - moderate MS & Mod-Severe MR.  . TUBAL LIGATION    . VULVA SURGERY       Current Outpatient Medications  Medication Sig Dispense Refill  . betamethasone dipropionate 0.05 % cream Apply topically.    . clobetasol (TEMOVATE) 0.05 % external solution     . EUCRISA 2 % OINT Apply 1 a small amount to skin twice a day  hands and neck    . FINACEA 15 % FOAM Apply topically.    Marland Kitchen ibuprofen (ADVIL) 200 MG tablet Take 400 mg by mouth every 6 (six) hours as needed for fever or moderate pain.    Marland Kitchen ketoconazole (NIZORAL) 2 % cream Apply topically every morning.    Marland Kitchen ketoconazole (NIZORAL) 2 % shampoo Apply topically.    Marland Kitchen liothyronine (CYTOMEL) 5 MCG tablet Take 5 mcg by mouth daily.   2  . metroNIDAZOLE (METROCREAM) 0.75 % cream Apply 1 application topically 2 (two) times daily.    Marland Kitchen SYNTHROID 75 MCG tablet Take 75 mcg by mouth daily before breakfast.     . terconazole (TERAZOL 3) 0.8 % vaginal cream Place 1 applicator vaginally at bedtime. For 3 days. 20 g 0  . triamcinolone (KENALOG) 0.1 % Apply topically 3 (three) times daily as needed.    Marland Kitchen azithromycin (ZITHROMAX) 250 MG tablet Take 250 mg by mouth 3 (three) times a week. (Patient not taking: Reported on 06/14/2020)    . fluconazole (DIFLUCAN) 150 MG tablet 1 tab by mouth every 3 days as needed (Patient not taking: No sig reported) 2 tablet 1  . flunisolide (NASALIDE) 25 MCG/ACT (0.025%) SOLN Place 2 sprays into the nose 2 (two) times daily. (Patient not taking: No sig reported)    . furosemide (LASIX) 20 MG tablet Take 1 tablet (20 mg total) by mouth daily. (Patient not taking: No sig reported) 5 tablet 0   No current facility-administered medications for this visit.    Allergies:   Montelukast sodium, Morphine and related, Other, Statins, and Warfarin and related    ROS:  Please see the history of present illness.   Otherwise, review of systems are positive for none.  All other systems are reviewed and negative.    PHYSICAL EXAM: VS:  BP 130/80   Pulse 71   Ht 5' (1.524 m)   Wt 124 lb 12.8 oz (56.6 kg)   SpO2 99%   BMI 24.37 kg/m  , BMI Body mass index is 24.37 kg/m.  GENERAL:  Well appearing NECK:  No jugular venous distention, waveform within normal limits, carotid upstroke brisk and symmetric, no bruits, no thyromegaly LUNGS:  Clear to auscultation bilaterally CHEST:  Unremarkable HEART:  PMI not displaced or sustained,S1 and S2 within normal limits, no S3, no S4, no clicks, no rubs, 2 out of 6 apical nonradiating brief systolic murmur, no diastolic murmurs ABD:  Flat, positive bowel sounds normal in frequency in pitch, no bruits, no rebound, no guarding, no midline  pulsatile mass, no hepatomegaly, no splenomegaly EXT:  2 plus pulses throughout, no edema, no cyanosis no clubbing      EKG:  EKG is  ordered today. Sinus rhythm, rate 71 axis within normal limits, intervals within normal limits, no acute ST-T wave changes.  Recent Labs: 11/09/2014: Magnesium 2.1 01/10/2015: ALT 27; BUN 9; Creatinine, Ser 0.70; Potassium 4.3; Sodium 139 02/01/2015: Hemoglobin 15.1*; Platelets 292.0 03/29/2015: TSH 4.03    Lipid Panel    Component Value Date/Time   TRIG 115 06/28/2014 1400      Wt Readings from Last 3 Encounters:  06/14/20 124 lb 12.8 oz (56.6 kg)  05/28/20 117 lb 11.2 oz (53.4 kg)  12/09/19 121 lb (54.9 kg)      Other studies Reviewed: Additional studies/ records that were reviewed today include:  Labs    ASSESSMENT AND PLAN:  MV REPAIR:    She had a stable repair visualized last year on echo.  I will repeat an echo next year but do not think one is indicated clinically this year.  She understands endocarditis prophylaxis.   DYSLIPIDEMIA: She does have dyslipidemia.  She did not want to take PCSK9 inhibitor and is intolerant of statins.   SLEEP APNEA: I have asked her to follow-up with her sleep doctors as she needs review of  her current prescription and device.   Current medicines are reviewed at length with the patient today.  The patient does not have concerns regarding medicines.  The following changes have been made:  None  Labs/ tests ordered today include:  None   Disposition:   FU with me in 12 months.    Signed, Minus Breeding, MD  06/14/2020 1:03 PM    [facility]

## 2020-06-14 ENCOUNTER — Ambulatory Visit (INDEPENDENT_AMBULATORY_CARE_PROVIDER_SITE_OTHER): Payer: Medicare Other | Admitting: Cardiology

## 2020-06-14 ENCOUNTER — Other Ambulatory Visit: Payer: Self-pay

## 2020-06-14 ENCOUNTER — Encounter: Payer: Self-pay | Admitting: Cardiology

## 2020-06-14 VITALS — BP 130/80 | HR 71 | Ht 60.0 in | Wt 124.8 lb

## 2020-06-14 DIAGNOSIS — I2510 Atherosclerotic heart disease of native coronary artery without angina pectoris: Secondary | ICD-10-CM

## 2020-06-14 DIAGNOSIS — Z9889 Other specified postprocedural states: Secondary | ICD-10-CM | POA: Diagnosis not present

## 2020-06-14 DIAGNOSIS — Z8249 Family history of ischemic heart disease and other diseases of the circulatory system: Secondary | ICD-10-CM | POA: Diagnosis not present

## 2020-06-14 DIAGNOSIS — I059 Rheumatic mitral valve disease, unspecified: Secondary | ICD-10-CM

## 2020-06-14 DIAGNOSIS — Z952 Presence of prosthetic heart valve: Secondary | ICD-10-CM

## 2020-06-14 DIAGNOSIS — E785 Hyperlipidemia, unspecified: Secondary | ICD-10-CM

## 2020-06-14 NOTE — Patient Instructions (Signed)
Medication Instructions:  No changes *If you need a refill on your cardiac medications before your next appointment, please call your pharmacy*  Testing/Procedures: Your physician has requested that you have an echocardiogram in January 2023. Echocardiography is a painless test that uses sound waves to create images of your heart. It provides your doctor with information about the size and shape of your heart and how well your heart's chambers and valves are working. This procedure takes approximately one hour. There are no restrictions for this procedure. [address]  Follow-Up: At [facility], you and your health needs are our priority.  As part of our continuing mission to provide you with exceptional heart care, we have created designated Provider Care Teams.  These Care Teams include your primary Cardiologist (physician) and Advanced Practice Providers (APPs -  Physician Assistants and Nurse Practitioners) who all work together to provide you with the care you need, when you need it.  We recommend signing up for the patient portal called "MyChart".  Sign up information is provided on this After Visit Summary.  MyChart is used to connect with patients for Virtual Visits (Telemedicine).  Patients are able to view lab/test results, encounter notes, upcoming appointments, etc.  Non-urgent messages can be sent to your provider as well.   To learn more about what you can do with MyChart, go to [email or web address].    Your next appointment:   12 month(s) You will receive a reminder letter in the mail two months in advance. If you don't receive a letter, please call our office to schedule the follow-up appointment.  The format for your next appointment:   In Person  Provider:   Minus Breeding, MD

## 2020-06-26 ENCOUNTER — Encounter: Payer: Self-pay | Admitting: Internal Medicine

## 2020-06-26 ENCOUNTER — Ambulatory Visit (INDEPENDENT_AMBULATORY_CARE_PROVIDER_SITE_OTHER): Payer: Medicare Other | Admitting: Internal Medicine

## 2020-06-26 ENCOUNTER — Other Ambulatory Visit: Payer: Self-pay

## 2020-06-26 VITALS — BP 132/80 | HR 92 | Temp 98.3°F | Ht 60.0 in | Wt 125.0 lb

## 2020-06-26 DIAGNOSIS — R739 Hyperglycemia, unspecified: Secondary | ICD-10-CM

## 2020-06-26 DIAGNOSIS — Z0001 Encounter for general adult medical examination with abnormal findings: Secondary | ICD-10-CM

## 2020-06-26 DIAGNOSIS — Z114 Encounter for screening for human immunodeficiency virus [HIV]: Secondary | ICD-10-CM | POA: Diagnosis not present

## 2020-06-26 DIAGNOSIS — I2510 Atherosclerotic heart disease of native coronary artery without angina pectoris: Secondary | ICD-10-CM

## 2020-06-26 DIAGNOSIS — Z1159 Encounter for screening for other viral diseases: Secondary | ICD-10-CM

## 2020-06-26 DIAGNOSIS — E782 Mixed hyperlipidemia: Secondary | ICD-10-CM | POA: Diagnosis not present

## 2020-06-26 DIAGNOSIS — E538 Deficiency of other specified B group vitamins: Secondary | ICD-10-CM

## 2020-06-26 DIAGNOSIS — G8929 Other chronic pain: Secondary | ICD-10-CM | POA: Insufficient documentation

## 2020-06-26 DIAGNOSIS — Z952 Presence of prosthetic heart valve: Secondary | ICD-10-CM | POA: Insufficient documentation

## 2020-06-26 DIAGNOSIS — Z8679 Personal history of other diseases of the circulatory system: Secondary | ICD-10-CM | POA: Insufficient documentation

## 2020-06-26 DIAGNOSIS — R400 Somnolence: Secondary | ICD-10-CM | POA: Insufficient documentation

## 2020-06-26 DIAGNOSIS — B373 Candidiasis of vulva and vagina: Secondary | ICD-10-CM

## 2020-06-26 DIAGNOSIS — E559 Vitamin D deficiency, unspecified: Secondary | ICD-10-CM

## 2020-06-26 DIAGNOSIS — R21 Rash and other nonspecific skin eruption: Secondary | ICD-10-CM | POA: Diagnosis not present

## 2020-06-26 DIAGNOSIS — T148XXA Other injury of unspecified body region, initial encounter: Secondary | ICD-10-CM | POA: Insufficient documentation

## 2020-06-26 DIAGNOSIS — B3731 Acute candidiasis of vulva and vagina: Secondary | ICD-10-CM | POA: Insufficient documentation

## 2020-06-26 DIAGNOSIS — R899 Unspecified abnormal finding in specimens from other organs, systems and tissues: Secondary | ICD-10-CM | POA: Insufficient documentation

## 2020-06-26 LAB — HEPATIC FUNCTION PANEL
ALT: 29 U/L (ref 0–35)
AST: 21 U/L (ref 0–37)
Albumin: 4.3 g/dL (ref 3.5–5.2)
Alkaline Phosphatase: 82 U/L (ref 39–117)
Bilirubin, Direct: 0 mg/dL (ref 0.0–0.3)
Total Bilirubin: 0.5 mg/dL (ref 0.2–1.2)
Total Protein: 6.6 g/dL (ref 6.0–8.3)

## 2020-06-26 LAB — PROTIME-INR
INR: 1 ratio (ref 0.8–1.0)
Prothrombin Time: 11.2 s (ref 9.6–13.1)

## 2020-06-26 LAB — LIPID PANEL
Cholesterol: 256 mg/dL — ABNORMAL HIGH (ref 0–200)
HDL: 73.5 mg/dL (ref >39.00)
LDL Cholesterol: 172 mg/dL — ABNORMAL HIGH (ref 0–99)
NonHDL: 182.12
Total CHOL/HDL Ratio: 3
Triglycerides: 50 mg/dL (ref 0.0–149.0)
VLDL: 10 mg/dL (ref 0.0–40.0)

## 2020-06-26 LAB — CBC WITH DIFFERENTIAL/PLATELET
Basophils Absolute: 0.1 10*3/uL (ref 0.0–0.1)
Basophils Relative: 1.1 % (ref 0.0–3.0)
Eosinophils Absolute: 0.1 10*3/uL (ref 0.0–0.7)
Eosinophils Relative: 1.5 % (ref 0.0–5.0)
HCT: 41.5 % (ref 36.0–46.0)
Hemoglobin: 14.2 g/dL (ref 12.0–15.0)
Lymphocytes Relative: 23.4 % (ref 12.0–46.0)
Lymphs Abs: 1.5 10*3/uL (ref 0.7–4.0)
MCHC: 34.2 g/dL (ref 30.0–36.0)
MCV: 86.3 fl (ref 78.0–100.0)
Monocytes Absolute: 0.4 10*3/uL (ref 0.1–1.0)
Monocytes Relative: 5.8 % (ref 3.0–12.0)
Neutro Abs: 4.3 10*3/uL (ref 1.4–7.7)
Neutrophils Relative %: 68.2 % (ref 43.0–77.0)
Platelets: 223 10*3/uL (ref 150.0–400.0)
RBC: 4.81 Mil/uL (ref 3.87–5.11)
RDW: 14.3 % (ref 11.5–15.5)
WBC: 6.4 10*3/uL (ref 4.0–10.5)

## 2020-06-26 LAB — BASIC METABOLIC PANEL
BUN: 15 mg/dL (ref 6–23)
CO2: 27 mEq/L (ref 19–32)
Calcium: 9.1 mg/dL (ref 8.4–10.5)
Chloride: 100 mEq/L (ref 96–112)
Creatinine, Ser: 0.68 mg/dL (ref 0.40–1.20)
GFR: 94.94 mL/min (ref >60.00)
Glucose, Bld: 110 mg/dL — ABNORMAL HIGH (ref 70–99)
Potassium: 4.5 mEq/L (ref 3.5–5.1)
Sodium: 132 mEq/L — ABNORMAL LOW (ref 135–145)

## 2020-06-26 LAB — VITAMIN D 25 HYDROXY (VIT D DEFICIENCY, FRACTURES): VITD: 17.76 ng/mL — ABNORMAL LOW (ref 30.00–100.00)

## 2020-06-26 LAB — SEDIMENTATION RATE: Sed Rate: 2 mm/hr (ref 0–30)

## 2020-06-26 LAB — VITAMIN B12: Vitamin B-12: 356 pg/mL (ref 211–911)

## 2020-06-26 LAB — TSH: TSH: 1.05 u[IU]/mL (ref 0.35–4.50)

## 2020-06-26 LAB — HEMOGLOBIN A1C: Hgb A1c MFr Bld: 5.5 % (ref 4.6–6.5)

## 2020-06-26 MED ORDER — FLUCONAZOLE 150 MG PO TABS: ORAL_TABLET | ORAL | 1 refills | Status: DC

## 2020-06-26 NOTE — Progress Notes (Signed)
Patient ID: Elaine Le, female   DOB: [birth year], 60 y.o.   MRN: [id number]         Chief Complaint:: wellness exam and Rash  to face, bruising, hld, yeast infection, vit d deficiency       HPI:  Elaine Le is a 60 y.o. female here for wellness exam, she is ok for HIV and hep c screening today, but declines to consider colonoscopy and covid booster for now                 Also c/o possible malar type face rash x 2 wks without pain, itching but persists and getting concerned about it.  Has several others in the family with rheumatological illness such as lupus and asks for rheum referral. Denies other joint pain   Also mentions has some easy bruising for some reason, without other overt bleeding or known anemia.  No hx of liver dz or other bleeding diathesis.  Does also have 3 days onset typical yeast type vaginal d/c, asks for tx with oral med.  Pt denies chest pain, increased sob or doe, wheezing, orthopnea, PND, increased LE swelling, palpitations, dizziness or syncope.  Denies new onset focal neuro s/s.   Pt denies polydipsia, polyuria,  Pt denies fever, wt loss, night sweats, loss of appetite, or other constitutional symptoms   Also mentions some double vision for a short time a week ago but none since or today, without HA.    Wt Readings from Last 3 Encounters:  06/26/20 125 lb (56.7 kg)  06/14/20 124 lb 12.8 oz (56.6 kg)  05/28/20 117 lb 11.2 oz (53.4 kg)   BP Readings from Last 3 Encounters:  06/26/20 132/80  06/14/20 130/80  05/28/20 126/66   Immunization History  Administered Date(s) Administered  . Moderna Sars-Covid-2 Vaccination 06/27/2019, 07/25/2019  . Pneumococcal Conjugate-13 08/23/2014  . Pneumococcal Polysaccharide-23 12/07/2013, 02/26/2014  . Tdap 12/07/2013  . Zoster 12/07/2013   Health Maintenance Due  Topic Date Due  . Hepatitis C Screening  Never done    Past Medical History:  Diagnosis Date  . Acute respiratory failure with hypercapnia ([place])  06/21/2014  . Acute systolic heart failure ([place]) 09/2014  . Adenomatous colon polyp   . Arthritis   . Back pain   . Chronic diastolic heart failure (HCC)    related to MR/MS  . Chronic pain syndrome   . Chronic sinusitis   . CN (constipation) 05/25/2014  . Community acquired pneumonia   . COPD (chronic obstructive pulmonary disease)-PFT pending  07/17/2014  . Diverticulosis   . Fatigue   . Fibromyalgia   . GERD (gastroesophageal reflux disease)   . Headaches, cluster   . Hiatal hernia   . Hx of adenomatous colonic polyps 03/10/2011   Oct 2012, repeat colon Oct 2017   . IBS (irritable bowel syndrome) 01/21/2011  . Influenza A 06/29/2014  . Internal hemorrhoids   . Irritable bowel syndrome (IBS)   . Mitral valve regurgitation    severe  . Mitral valve stenosis, moderate 07/11/2014  . Muscle pain   . Muscular deconditioning 07/05/2014  . Night sweats   . Pleural effusion   . Pneumonia   . Protein-calorie malnutrition, severe ([place]) 07/06/2014  . 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 S/P minimally invasive mitral valve replacement with bioprosthetic valve 11/08/2014   25 mm [facility] Mitral bovine bioprosthetic tissue valve placed via right mini thoracotomy  approach  . Tobacco abuse    Past Surgical History:  Procedure Laterality Date  . CYSTOSTOMY W/ BLADDER DILATION     at age 42  . LEFT AND RIGHT HEART CATHETERIZATION WITH CORONARY ANGIOGRAM N/A 07/27/2014   Procedure: LEFT AND RIGHT HEART CATHETERIZATION WITH CORONARY ANGIOGRAM;  Surgeon: Burnell Blanks, MD;  Location: [facility] CATH LAB;  Right left heart catheterization: Mild/minimal coronary artery disease. RV: 50/7/17, PA: 48/29 (mean 38), PCWP 33 mmHg  . MITRAL VALVE REPAIR Right 11/08/2014   Procedure: MINIMALLY INVASIVE MITRAL VALVE REPLACEMENT(MVR);  Surgeon: Rexene Alberts, MD;  Location: [place];  Service: Open Heart Surgery;  Laterality: Right;  .  NEUROPLASTY / TRANSPOSITION MEDIAN NERVE AT CARPAL TUNNEL BILATERAL    . TEE WITHOUT CARDIOVERSION N/A 07/10/2014   Procedure: TRANSESOPHAGEAL ECHOCARDIOGRAM (TEE);  Surgeon: Lelon Perla, MD;  Location[facility] ENDOSCOPY;  Service: Cardiovascular;  55-60%. No regional wall motion modalities. Moderate mitral stenosis with severe regurgitation and moderate to severely dilated left atrium.  . TEE WITHOUT CARDIOVERSION N/A 11/08/2014   Procedure: TRANSESOPHAGEAL ECHOCARDIOGRAM (TEE);  Surgeon: Rexene Alberts, MD;  Location: [place];  Service: Open Heart Surgery;  Laterality: N/A;  . THORACIC OUTLET SURGERY    . TRANSTHORACIC ECHOCARDIOGRAM  07/04/2014   Normal LV Size/Fnx - EF 60-65% no RWMA; MV: thickened leaflets with doming, fixed Posterior leaflet, anterior leaflet w/ large/shaggy & mobile density.  c/w Rheumatic Mitral disease - moderate MS & Mod-Severe MR.  . TUBAL LIGATION    . VULVA SURGERY      reports that she quit smoking about 6 years ago. Her smoking use included cigarettes. She has a 17.50 pack-year smoking history. She has never used smokeless tobacco. She reports that she does not drink alcohol and does not use drugs. family history includes Colon polyps in her mother; Diabetes in her brother and sister; Irritable bowel syndrome in her daughter and mother; Kidney cancer in her mother; Liver disease in her sister. Allergies  Allergen Reactions  . Estradiol Hives  . Metoclopramide Hives  . Montelukast Sodium     Intolerance - effects her mood   . Morphine And Related Other (See Comments)    Altered mental state Altered mental state  . Other Other (See Comments) and Hives    Any antidepressants per pt- causes altered mental state, anger Other reaction(s): rash  . Statins Other (See Comments)    Myalgias with several statins  . Warfarin And Related   . Cefaclor Rash   Current Outpatient Medications on File Prior to Visit  Medication Sig Dispense Refill  . azithromycin (ZITHROMAX)  250 MG tablet Take 250 mg by mouth 3 (three) times a week.    . betamethasone dipropionate 0.05 % cream Apply topically.    . clobetasol (TEMOVATE) 0.05 % external solution     . EUCRISA 2 % OINT Apply 1 a small amount to skin twice a day  hands and neck    . FINACEA 15 % FOAM Apply topically.    Marland Kitchen ketoconazole (NIZORAL) 2 % shampoo Apply topically.    Marland Kitchen liothyronine (CYTOMEL) 5 MCG tablet Take 5 mcg by mouth daily.   2  . metroNIDAZOLE (METROCREAM) 0.75 % cream Apply 1 application topically 2 (two) times daily.    Marland Kitchen SYNTHROID 75 MCG tablet Take 75 mcg by mouth daily before breakfast.     . terconazole (TERAZOL 3) 0.8 % vaginal cream Place 1 applicator vaginally at bedtime. For 3 days. 20 g 0  .  triamcinolone (KENALOG) 0.1 % Apply topically 3 (three) times daily as needed.    . flunisolide (NASALIDE) 25 MCG/ACT (0.025%) SOLN Place 2 sprays into the nose 2 (two) times daily. (Patient not taking: Reported on 06/26/2020)    . furosemide (LASIX) 20 MG tablet Take 1 tablet (20 mg total) by mouth daily. (Patient not taking: Reported on 06/26/2020) 5 tablet 0  . ibuprofen (ADVIL) 200 MG tablet Take 400 mg by mouth every 6 (six) hours as needed for fever or moderate pain. (Patient not taking: Reported on 06/26/2020)    . ketoconazole (NIZORAL) 2 % cream Apply topically every morning. (Patient not taking: Reported on 06/26/2020)    . [DISCONTINUED] pantoprazole (PROTONIX) 40 MG tablet Take 1 tablet (40 mg total) by mouth daily. 30 tablet 11   No current facility-administered medications on file prior to visit.        ROS:  All others reviewed and negative.  Objective        PE:  BP 132/80   Pulse 92   Temp 98.3 F (36.8 C) (Oral)   Ht 5' (1.524 m)   Wt 125 lb (56.7 kg)   SpO2 97%   BMI 24.41 kg/m                 Constitutional: Pt appears in NAD               HENT: Head: NCAT.                Right Ear: External ear normal.                 Left Ear: External ear normal.                Eyes: .  Pupils are equal, round, and reactive to light. Conjunctivae and EOM are normal               Nose: without d/c or deformity               Neck: Neck supple. Gross normal ROM               Cardiovascular: Normal rate and regular rhythm.                 Pulmonary/Chest: Effort normal and breath sounds without rales or wheezing.                Abd:  Soft, NT, ND, + BS, no organomegaly               Neurological: Pt is alert. At baseline orientation, motor grossly intact               Skin: LE edema - none, has nontender erythem rash somewhat to malar distribution, also several small bruises to extremities               Psychiatric: Pt behavior is normal without agitation   Micro: none  Cardiac tracings I have personally interpreted today:  none  Pertinent Radiological findings (summarize): none   Lab Results  Component Value Date   WBC 6.4 06/26/2020   HGB 14.2 06/26/2020   HCT 41.5 06/26/2020   PLT 223.0 06/26/2020   GLUCOSE 110 (H) 06/26/2020   CHOL 256 (H) 06/26/2020   TRIG 50.0 06/26/2020   HDL 73.50 06/26/2020   LDLCALC 172 (H) 06/26/2020   ALT 29 06/26/2020   AST 21 06/26/2020   NA 132 (L) 06/26/2020   K 4.5  06/26/2020   CL 100 06/26/2020   CREATININE 0.68 06/26/2020   BUN 15 06/26/2020   CO2 27 06/26/2020   TSH 1.05 06/26/2020   INR 1.0 06/26/2020   HGBA1C 5.5 06/26/2020   Assessment/Plan:  Elaine Le is a 61 y.o. White or Caucasian [1] female with  has a past medical history of Acute respiratory failure with hypercapnia ([place]) (11/20/3662), Acute systolic heart failure ([place]) (09/2014), Adenomatous colon polyp, Arthritis, Back pain, Chronic diastolic heart failure ([place]), Chronic pain syndrome, Chronic sinusitis, CN (constipation) (05/25/2014), Community acquired pneumonia, COPD (chronic obstructive pulmonary disease)-PFT pending  (07/17/2014), Diverticulosis, Fatigue, Fibromyalgia, GERD (gastroesophageal reflux disease), Headaches, cluster, Hiatal hernia, adenomatous  colonic polyps (03/10/2011), IBS (irritable bowel syndrome) (01/21/2011), Influenza A (06/29/2014), Internal hemorrhoids, Irritable bowel syndrome (IBS), Mitral valve regurgitation, Mitral valve stenosis, moderate (07/11/2014), Muscle pain, Muscular deconditioning (07/05/2014), Night sweats, Pleural effusion, Pneumonia, Protein-calorie malnutrition, severe ([place]) (07/06/2014), Rheumatic disease of mitral valve (07/10/2014), S/P minimally invasive mitral valve replacement with bioprosthetic valve (11/08/2014), and Tobacco abuse.  Encounter for well adult exam with abnormal findings Age and sex appropriate education and counseling updated with regular exercise and diet Referrals for preventative services - for hep c and HIV screening, declines colonoscopy Immunizations addressed - declines covid booster Smoking counseling  - none needed Evidence for depression or other mood disorder - none significant Most recent labs reviewed. I have personally reviewed and have noted: 1) the patient's medical and social history 2) The patient's current medications and supplements 3) The patient's height, weight, and BMI have been recorded in the chart   Bruising Etiology unclear, clinical significant?  Today for INR with cbc/platelets  Mixed hyperlipidemia Lab Results  Component Value Date   LDLCALC 172 (H) 06/26/2020   Stable, pt to continue low chold diet as is statin intolerant, declines lipid clinic referral  Current Outpatient Medications (Endocrine & Metabolic):  .  liothyronine (CYTOMEL) 5 MCG tablet, Take 5 mcg by mouth daily.  Marland Kitchen  SYNTHROID 75 MCG tablet, Take 75 mcg by mouth daily before breakfast.   Current Outpatient Medications (Cardiovascular):  .  furosemide (LASIX) 20 MG tablet, Take 1 tablet (20 mg total) by mouth daily. (Patient not taking: Reported on 06/26/2020)  Current Outpatient Medications (Respiratory):  .  flunisolide (NASALIDE) 25 MCG/ACT (0.025%) SOLN, Place 2 sprays into the nose 2  (two) times daily. (Patient not taking: Reported on 06/26/2020)  Current Outpatient Medications (Analgesics):  .  ibuprofen (ADVIL) 200 MG tablet, Take 400 mg by mouth every 6 (six) hours as needed for fever or moderate pain. (Patient not taking: Reported on 06/26/2020)   Current Outpatient Medications (Other):  .  azithromycin (ZITHROMAX) 250 MG tablet, Take 250 mg by mouth 3 (three) times a week. .  betamethasone dipropionate 0.05 % cream, Apply topically. .  clobetasol (TEMOVATE) 0.05 % external solution,  .  EUCRISA 2 % OINT, Apply 1 a small amount to skin twice a day  hands and neck .  FINACEA 15 % FOAM, Apply topically. Marland Kitchen  ketoconazole (NIZORAL) 2 % shampoo, Apply topically. .  metroNIDAZOLE (METROCREAM) 0.75 % cream, Apply 1 application topically 2 (two) times daily. Marland Kitchen  terconazole (TERAZOL 3) 0.8 % vaginal cream, Place 1 applicator vaginally at bedtime. For 3 days. Marland Kitchen  triamcinolone (KENALOG) 0.1 %, Apply topically 3 (three) times daily as needed. .  fluconazole (DIFLUCAN) 150 MG tablet, 1 tab by mouth every 3 days as needed .  ketoconazole (NIZORAL) 2 % cream, Apply topically every morning. (Patient  not taking: Reported on 06/26/2020)   Rash ? Rosacea vs other such as malar rash; no evidence of infection; will defer to pt for rheum referral, declines other tx for now  Vitamin D deficiency Last vitamin D Lab Results  Component Value Date   VD25OH 17.76 (L) 06/26/2020   Low, to start vit d3 2000 u qd oral replacement   Yeast vaginitis Mild to mod, for antibx course,  to f/u any worsening symptoms or concerns  Followup: Return in about 6 months (around 12/27/2020).  Cathlean Cower, MD 07/05/2020 5:32 PM [facility]

## 2020-06-26 NOTE — Patient Instructions (Signed)
Please continue all other medications as before, and refills have been done if requested.  Please have the pharmacy call with any other refills you may need.  Please continue your efforts at being more active, low cholesterol diet, and weight control.  You are otherwise up to date with prevention measures today.  Please keep your appointments with your specialists as you may have planned  You will be contacted regarding the referral for: rheumatology  Please go to the LAB at the blood drawing area for the tests to be done  You will be contacted by phone if any changes need to be made immediately.  Otherwise, you will receive a letter about your results with an explanation, but please check with MyChart first.  Please remember to sign up for MyChart if you have not done so, as this will be important to you in the future with finding out test results, communicating by private email, and scheduling acute appointments online when needed.  Please make an Appointment to return in 6 months, or sooner if needed

## 2020-06-27 ENCOUNTER — Encounter: Payer: Self-pay | Admitting: Internal Medicine

## 2020-06-28 ENCOUNTER — Telehealth: Payer: Self-pay | Admitting: Internal Medicine

## 2020-06-28 NOTE — Telephone Encounter (Signed)
Lucky for me then, since the terazol was the one that was sent

## 2020-06-28 NOTE — Telephone Encounter (Signed)
terconazole (TERAZOL 3) 0.8 % vaginal cream [id number] - [place], [place] - [address] AT [place] Phone:  [phone]  Fax:  [phone]     States she would rather get this instead of the  fluconazole (DIFLUCAN) 150 MG tablet because the diflucan doesn't help with the itch

## 2020-07-03 ENCOUNTER — Telehealth: Payer: Self-pay | Admitting: Internal Medicine

## 2020-07-03 NOTE — Telephone Encounter (Signed)
Please call to discuss lab results 

## 2020-07-03 NOTE — Telephone Encounter (Signed)
Patient call returned notes given.

## 2020-07-04 NOTE — Telephone Encounter (Signed)
No need for the high dose weekly vit d  Ok to starty the otc daily forever as this would work as well  The lipid clinic is run at the cardiology clinic by a pharmD supervised by cardiology to work on getting LDL to goal given combination medications as tolerated, or even injectable med such as repatha

## 2020-07-05 ENCOUNTER — Encounter: Payer: Self-pay | Admitting: Internal Medicine

## 2020-07-05 NOTE — Assessment & Plan Note (Addendum)
Etiology unclear, clinical significant?  Today for INR with cbc/platelets

## 2020-07-05 NOTE — Assessment & Plan Note (Signed)
Age and sex appropriate education and counseling updated with regular exercise and diet Referrals for preventative services - for hep c and HIV screening, declines colonoscopy Immunizations addressed - declines covid booster Smoking counseling  - none needed Evidence for depression or other mood disorder - none significant Most recent labs reviewed. I have personally reviewed and have noted: 1) the patient's medical and social history 2) The patient's current medications and supplements 3) The patient's height, weight, and BMI have been recorded in the chart

## 2020-07-05 NOTE — Assessment & Plan Note (Signed)
?   Rosacea vs other such as malar rash; no evidence of infection; will defer to pt for rheum referral, declines other tx for now

## 2020-07-05 NOTE — Assessment & Plan Note (Signed)
Last vitamin D Lab Results  Component Value Date   VD25OH 17.76 (L) 06/26/2020   Low, to start vit d3 2000 u qd oral replacement

## 2020-07-05 NOTE — Assessment & Plan Note (Signed)
Mild to mod, for antibx course,  to f/u any worsening symptoms or concerns 

## 2020-07-05 NOTE — Assessment & Plan Note (Addendum)
Lab Results  Component Value Date   LDLCALC 172 (H) 06/26/2020   Stable, pt to continue low chold diet as is statin intolerant, declines lipid clinic referral  Current Outpatient Medications (Endocrine & Metabolic):  .  liothyronine (CYTOMEL) 5 MCG tablet, Take 5 mcg by mouth daily.  Marland Kitchen  SYNTHROID 75 MCG tablet, Take 75 mcg by mouth daily before breakfast.   Current Outpatient Medications (Cardiovascular):  .  furosemide (LASIX) 20 MG tablet, Take 1 tablet (20 mg total) by mouth daily. (Patient not taking: Reported on 06/26/2020)  Current Outpatient Medications (Respiratory):  .  flunisolide (NASALIDE) 25 MCG/ACT (0.025%) SOLN, Place 2 sprays into the nose 2 (two) times daily. (Patient not taking: Reported on 06/26/2020)  Current Outpatient Medications (Analgesics):  .  ibuprofen (ADVIL) 200 MG tablet, Take 400 mg by mouth every 6 (six) hours as needed for fever or moderate pain. (Patient not taking: Reported on 06/26/2020)   Current Outpatient Medications (Other):  .  azithromycin (ZITHROMAX) 250 MG tablet, Take 250 mg by mouth 3 (three) times a week. .  betamethasone dipropionate 0.05 % cream, Apply topically. .  clobetasol (TEMOVATE) 0.05 % external solution,  .  EUCRISA 2 % OINT, Apply 1 a small amount to skin twice a day  hands and neck .  FINACEA 15 % FOAM, Apply topically. Marland Kitchen  ketoconazole (NIZORAL) 2 % shampoo, Apply topically. .  metroNIDAZOLE (METROCREAM) 0.75 % cream, Apply 1 application topically 2 (two) times daily. Marland Kitchen  terconazole (TERAZOL 3) 0.8 % vaginal cream, Place 1 applicator vaginally at bedtime. For 3 days. Marland Kitchen  triamcinolone (KENALOG) 0.1 %, Apply topically 3 (three) times daily as needed. .  fluconazole (DIFLUCAN) 150 MG tablet, 1 tab by mouth every 3 days as needed .  ketoconazole (NIZORAL) 2 % cream, Apply topically every morning. (Patient not taking: Reported on 06/26/2020)

## 2020-09-11 NOTE — Progress Notes (Signed)
Virtual Visit via telephone Note  I connected with Elaine Le on 09/12/20 at  1:00 PM EDT by telephone and verified that I am speaking with the correct person using two identifiers.   I discussed the limitations of evaluation and management by telemedicine and the availability of in person appointments. The patient expressed understanding and agreed to proceed.  Present for the visit:  Myself, Dr Billey Gosling, Gerline Legacy.  The patient is currently at home and I am in the office.    No referring provider.    History of Present Illness: She is here for an acute visit for cold symptoms.   Her symptoms started 2 days ago.    She is experiencing low-grade fever, chills, alternating nasal congestion and rhinorrhea, sinus pain, scratchy and itchy throat, mild ear pressure, postnasal drainage, dry cough, nausea from the drainage in her throat, body aches, mild dizziness and occasional headaches  She thinks she may have a sinus infection.  She did mention that her sister tested positive for COVID yesterday.  She does see her sister on a daily basis.      Review of Systems  Constitutional: Positive for chills and fever (low grade the other day - 100). Negative for malaise/fatigue.  HENT: Positive for congestion (and rhinorrhea), sinus pain and sore throat. Negative for ear pain (mild pressure).        PND, itchy and scratchy throat  Respiratory: Positive for cough. Negative for sputum production, shortness of breath and wheezing.   Gastrointestinal: Positive for nausea (pnd). Negative for diarrhea.  Musculoskeletal: Positive for myalgias.  Neurological: Positive for dizziness (mild) and headaches.      Social History   Socioeconomic History  . Marital status: Single    Spouse name: Not on file  . Number of children: 1  . Years of education: Not on file  . Highest education level: Not on file  Occupational History  . Occupation: disabled  Tobacco Use  . Smoking  status: Former Smoker    Packs/day: 0.50    Years: 35.00    Pack years: 17.50    Types: Cigarettes    Quit date: 06/17/2014    Years since quitting: 6.2  . Smokeless tobacco: Never Used  Substance and Sexual Activity  . Alcohol use: No    Alcohol/week: 0.0 standard drinks    Comment: rare  . Drug use: No  . Sexual activity: Never  Other Topics Concern  . Not on file  Social History Narrative  . Not on file   Social Determinants of Health   Financial Resource Strain: Not on file  Food Insecurity: Not on file  Transportation Needs: Not on file  Physical Activity: Not on file  Stress: Not on file  Social Connections: Not on file        Assessment and Plan:  See Problem List for Assessment and Plan of chronic medical problems.   Follow Up Instructions:    I discussed the assessment and treatment plan with the patient. The patient was provided an opportunity to ask questions and all were answered. The patient agreed with the plan and demonstrated an understanding of the instructions.   The patient was advised to call back or seek an in-person evaluation if the symptoms worsen or if the condition fails to improve as anticipated.  Time spent on telephone call: 15 minutes  Binnie Rail, MD

## 2020-09-12 ENCOUNTER — Encounter: Payer: Self-pay | Admitting: Internal Medicine

## 2020-09-12 ENCOUNTER — Telehealth (INDEPENDENT_AMBULATORY_CARE_PROVIDER_SITE_OTHER): Payer: Medicare Other | Admitting: Internal Medicine

## 2020-09-12 DIAGNOSIS — J069 Acute upper respiratory infection, unspecified: Secondary | ICD-10-CM | POA: Diagnosis not present

## 2020-09-12 NOTE — Assessment & Plan Note (Signed)
Acute Symptoms started 2 days ago Sister tested positive for COVID yesterday and she sees her on a daily basis Concern for COVID versus viral URI/sinus infection Advised that we need to get her tested for COVID so that we know if she has this or not.  Discussed that her symptoms could be consistent with COVID along with a URI or sinus infection.  I do not think that she needs antibiotics at this point anyway so I think it is okay to wait for the results of the COVID test since her symptoms do sound more viral in nature She does not have a computer and we will arrange testing for her at a local site Symptomatic treatment, rest and fluids until results of COVID come back

## 2020-09-13 ENCOUNTER — Telehealth: Payer: Self-pay | Admitting: Internal Medicine

## 2020-09-13 LAB — SARS-COV-2, NAA 2 DAY TAT

## 2020-09-13 LAB — NOVEL CORONAVIRUS, NAA: SARS-CoV-2, NAA: DETECTED — AB

## 2020-09-13 NOTE — Telephone Encounter (Signed)
Test for covid is positive.  She does qualify for anti-viral treatment.  Does she want to one of the oral anti-viral medications?  If yes, confirm pharmacy.

## 2020-09-13 NOTE — Telephone Encounter (Signed)
Called pt, LVM, to call and discuss.

## 2020-11-20 ENCOUNTER — Telehealth: Payer: Self-pay | Admitting: Internal Medicine

## 2020-11-20 NOTE — Telephone Encounter (Signed)
Pt states she only in came for a rash.  Denies coming in for physical.  Will send to coding for evaluation.

## 2020-11-20 NOTE — Telephone Encounter (Signed)
Patient is calling stating that she was not seen for a wellness visit on 3.1.22 only a rash. Her insurance company is denying her claim and charging her $305  Please advise.

## 2021-01-29 ENCOUNTER — Other Ambulatory Visit: Payer: Self-pay | Admitting: Obstetrics & Gynecology

## 2021-01-29 DIAGNOSIS — Z1231 Encounter for screening mammogram for malignant neoplasm of breast: Secondary | ICD-10-CM

## 2021-02-27 ENCOUNTER — Other Ambulatory Visit: Payer: Self-pay

## 2021-02-27 ENCOUNTER — Ambulatory Visit
Admission: RE | Admit: 2021-02-27 | Discharge: 2021-02-27 | Disposition: A | Discharge status: Home / Self Care | Payer: Medicare Other | Source: Ambulatory Visit | Attending: Obstetrics & Gynecology | Admitting: Obstetrics & Gynecology

## 2021-02-27 DIAGNOSIS — Z1231 Encounter for screening mammogram for malignant neoplasm of breast: Secondary | ICD-10-CM

## 2021-03-29 ENCOUNTER — Institutional Professional Consult (permissible substitution): Payer: Medicare Other | Admitting: Internal Medicine

## 2021-04-01 ENCOUNTER — Encounter: Payer: Self-pay | Admitting: Internal Medicine

## 2021-04-01 ENCOUNTER — Ambulatory Visit (INDEPENDENT_AMBULATORY_CARE_PROVIDER_SITE_OTHER): Payer: Medicare Other | Admitting: Internal Medicine

## 2021-04-01 ENCOUNTER — Other Ambulatory Visit: Payer: Self-pay

## 2021-04-01 VITALS — BP 122/70 | HR 78 | Temp 98.6°F | Ht 60.0 in | Wt 122.0 lb

## 2021-04-01 DIAGNOSIS — K219 Gastro-esophageal reflux disease without esophagitis: Secondary | ICD-10-CM | POA: Diagnosis not present

## 2021-04-01 DIAGNOSIS — R0602 Shortness of breath: Secondary | ICD-10-CM

## 2021-04-01 DIAGNOSIS — Z87891 Personal history of nicotine dependence: Secondary | ICD-10-CM

## 2021-04-01 MED ORDER — ALBUTEROL SULFATE HFA 108 (90 BASE) MCG/ACT IN AERS: 2.0000 | INHALATION_SPRAY | Freq: Four times a day (QID) | RESPIRATORY_TRACT | 5 refills | Status: DC | PRN

## 2021-04-01 NOTE — Patient Instructions (Signed)
Please schedule follow up scheduled with myself in 2 months.  If my schedule is not open yet, we will contact you with a reminder closer to that time. Please call [phone] if you haven't heard from [place] a month before.   Before your next visit I would like you to have: Full set of PFTs - 45 min   Take the albuterol rescue inhaler every 4 to 6 hours as needed for wheezing or shortness of breath. You can also take it 15 minutes before exercise or exertional activity. Side effects include heart racing or pounding, jitters or anxiety. If you have a history of an irregular heart rhythm, it can make this worse. Can also give some patients a hard time sleeping.  What is GERD? Gastroesophageal reflux disease (GERD) is gastroesophageal reflux diseasewhich occurs when the lower esophageal sphincter (LES) opens spontaneously, for varying periods of time, or does not close properly and stomach contents rise up into the esophagus. GER is also called acid reflux or acid regurgitation, because digestive juices--called acids--rise up with the food. The esophagus is the tube that carries food from the mouth to the stomach. The LES is a ring of muscle at the bottom of the esophagus that acts like a valve between the esophagus and stomach.  When acid reflux occurs, food or fluid can be tasted in the back of the mouth. When refluxed stomach acid touches the lining of the esophagus it may cause a burning sensation in the chest or throat called heartburn or acid indigestion. Occasional reflux is common. Persistent reflux that occurs more than twice a week is considered GERD, and it can eventually lead to more serious health problems. People of all ages can have GERD. Studies have shown that GERD may worsen or contribute to asthma, chronic cough, and pulmonary fibrosis.   What are the symptoms of GERD? The main symptom of GERD in adults is frequent heartburn, also called acid indigestion--burning-type pain in the  lower part of the mid-chest, behind the breast bone, and in the mid-abdomen.  Not all reflux is acidic in nature, and many patients don't have heart burn at all. Sometimes it feels like a cough (either dry or with mucus), choking sensation, asthma, shortness of breath, waking up at night, frequent throat clearing, or trouble swallowing.    What causes GERD? The reason some people develop GERD is still unclear. However, research shows that in people with GERD, the LES relaxes while the rest of the esophagus is working. Anatomical abnormalities such as a hiatal hernia may also contribute to GERD. A hiatal hernia occurs when the upper part of the stomach and the LES move above the diaphragm, the muscle wall that separates the stomach from the chest. Normally, the diaphragm helps the LES keep acid from rising up into the esophagus. When a hiatal hernia is present, acid reflux can occur more easily. A hiatal hernia can occur in people of any age and is most often a normal finding in otherwise healthy people over age 81. Most of the time, a hiatal hernia produces no symptoms.   Other factors that may contribute to GERD include - Obesity or recent weight gain - Pregnancy  - Smoking  - Diet - Certain medications  Common foods that can worsen reflux symptoms include: - carbonated beverages - artificial sweeteners - citrus fruits  - chocolate  - drinks with caffeine or alcohol  - fatty and fried foods  - garlic and onions  - mint flavorings  -  spicy foods  - tomato-based foods, like spaghetti sauce, salsa, chili, and pizza   Lifestyle Changes If you smoke, stop.  Avoid foods and beverages that worsen symptoms (see above.) Lose weight if needed.  Eat small, frequent meals.  Wear loose-fitting clothes.  Avoid lying down for 3 hours after a meal.  Raise the head of your bed 6 to 8 inches by securing wood blocks under the bedposts. Just using extra pillows will not help, but using a wedge-shaped  pillow may be helpful.  Medications  H2 blockers, such as cimetidine (Tagamet HB), famotidine (Pepcid AC), nizatidine (Axid AR), and ranitidine (Zantac 75), decrease acid production. They are available in prescription strength and over-the-counter strength. These drugs provide short-term relief and are effective for about half of those who have GERD symptoms.  Proton pump inhibitors include omeprazole (Prilosec, Zegerid), lansoprazole (Prevacid), pantoprazole (Protonix), rabeprazole (Aciphex), and esomeprazole (Nexium), which are available by prescription. Prilosec is also available in over-the-counter strength. Proton pump inhibitors are more effective than H2 blockers and can relieve symptoms and heal the esophageal lining in almost everyone who has GERD.  Because drugs work in different ways, combinations of medications may help control symptoms. People who get heartburn after eating may take both antacids and H2 blockers. The antacids work first to neutralize the acid in the stomach, and then the H2 blockers act on acid production. By the time the antacid stops working, the H2 blocker will have stopped acid production. Your health care provider is the best source of information about how to use medications for GERD.   Points to Remember 1. You can have GERD without having heartburn. Your symptoms could include a dry cough, asthma symptoms, or trouble swallowing.  2. Taking medications daily as prescribed is important in controlling you symptoms.  Sometimes it can take up to 8 weeks to fully achieve the effects of the medications prescribed.  3. Coughing related to GERD can be difficult to treat and is very frustrating!  However, it is important to stick with these medications and lifestyle modifications before pursuing more aggressive or invasive test and treatments.

## 2021-04-01 NOTE — Progress Notes (Signed)
Elaine Le    [id number]    January 05, 1961  Primary Care Physician:John, Hunt Oris, MD  Referring Physician: Biagio Borg, MD [address] [place],  [place] [address] Reason for Consultation: cough and chest tightness Date of Consultation: 04/01/2021  Chief complaint:   Chief Complaint  Patient presents with   Consult    Patient reports that she has some dry cough at times, denies any shortness of breath, wheezing or chest tightness.      HPI: Elaine Le is a 60 y.o. woman with past medical history of former tobacco use disorder who presents for new patient evaluation. She has previously seen Dr. Lake Bells but it has been over 4 years. Here with worsening dyspnea on exertion noticed when walking her dogs.  She has also noticed an occasional dry cough which is new. She had covid in June 2022. Symptoms have been coming on since August 2022. Worse with humidity. She can still do everything she wants to do but feels much slower. She notes sometimes having trouble getting a breath out.  In 2015 she was hospitalized with the flu and hospitalized with pneumonia. She had a bovine mitral valve replacement as well in 2016.  She does not take any breathing treatments or inhalers. She took them a few years ago. She does not think they were helping her then.    No childhood respiratory disease. She did avoid exertion due to dyspnea which she now attributes to her heart valve.   She credits Dr. Lake Bells for saving her life with the heart valve and she was told she does not have COPD by him.   She has OSA and wears CPAP  nightly.   Social history:  Occupation: she is disability for fibromyalgia, thyroid disease. Exposures: lives at home independently with dogs x 2 Smoking history: 17.5 pack years  Social History   Occupational History   Occupation: disabled  Tobacco Use   Smoking status: Former    Packs/day: 0.50    Years: 35.00    Pack years: 17.50    Types:  Cigarettes    Quit date: 06/17/2014    Years since quitting: 6.7   Smokeless tobacco: Never  Substance and Sexual Activity   Alcohol use: No    Alcohol/week: 0.0 standard drinks    Comment: rare   Drug use: No   Sexual activity: Never    Relevant family history:  Family History  Problem Relation Age of Onset   Kidney cancer Mother    Colon polyps Mother    Irritable bowel syndrome Mother    Diabetes Sister    Liver disease Sister    Irritable bowel syndrome Daughter    Breast cancer Paternal Aunt    Diabetes Brother    Colon cancer Neg Hx    Thyroid disease Neg Hx     Past Medical History:  Diagnosis Date   Acute respiratory failure with hypercapnia ([place] [facility]) 9/89/2119   Acute systolic heart failure ([place]) 09/2014   Adenomatous colon polyp    Arthritis    Back pain    Chronic diastolic heart failure ([facility])    related to MR/MS   Chronic pain syndrome    Chronic sinusitis    CN (constipation) 05/25/2014   Community acquired pneumonia    COPD (chronic obstructive pulmonary disease)-PFT pending  07/17/2014   Diverticulosis    Fatigue    Fibromyalgia    GERD (gastroesophageal reflux disease)  Headaches, cluster    Hiatal hernia    Hx of adenomatous colonic polyps 03/10/2011   Oct 2012, repeat colon Oct 2017    IBS (irritable bowel syndrome) 01/21/2011   Influenza A 06/29/2014   Internal hemorrhoids    Irritable bowel syndrome (IBS)    Mitral valve regurgitation    severe   Mitral valve stenosis, moderate 07/11/2014   Muscle pain    Muscular deconditioning 07/05/2014   Night sweats    Pleural effusion    Pneumonia    Protein-calorie malnutrition, severe (HCC) 07/06/2014   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/10/2014   Severe mitral regurgitation with moderate mitral stenosis   S/P minimally invasive mitral valve replacement with bioprosthetic valve 11/08/2014   25 mm [facility] Mitral bovine bioprosthetic tissue valve placed via right mini thoracotomy approach   Tobacco  abuse     Past Surgical History:  Procedure Laterality Date   CYSTOSTOMY W/ BLADDER DILATION     at age 60   LEFT AND RIGHT HEART CATHETERIZATION WITH CORONARY ANGIOGRAM N/A 07/27/2014   Procedure: LEFT AND RIGHT HEART CATHETERIZATION WITH CORONARY ANGIOGRAM;  Surgeon: Burnell Blanks, MD;  Location: [facility] CATH LAB;  Right left heart catheterization: Mild/minimal coronary artery disease. RV: 50/7/17, PA: 48/29 (mean 38), PCWP 33 mmHg   MITRAL VALVE REPAIR Right 11/08/2014   Procedure: MINIMALLY INVASIVE MITRAL VALVE REPLACEMENT(MVR);  Surgeon: Rexene Alberts, MD;  Location: [place];  Service: Open Heart Surgery;  Laterality: Right;   NEUROPLASTY / TRANSPOSITION MEDIAN NERVE AT CARPAL TUNNEL BILATERAL     TEE WITHOUT CARDIOVERSION N/A 07/10/2014   Procedure: TRANSESOPHAGEAL ECHOCARDIOGRAM (TEE);  Surgeon: Lelon Perla, MD;  Location: [facility] ENDOSCOPY;  Service: Cardiovascular;  55-60%. No regional wall motion modalities. Moderate mitral stenosis with severe regurgitation and moderate to severely dilated left atrium.   TEE WITHOUT CARDIOVERSION N/A 11/08/2014   Procedure: TRANSESOPHAGEAL ECHOCARDIOGRAM (TEE);  Surgeon: Rexene Alberts, MD;  Location: [place];  Service: Open Heart Surgery;  Laterality: N/A;   THORACIC OUTLET SURGERY     TRANSTHORACIC ECHOCARDIOGRAM  07/04/2014   Normal LV Size/Fnx - EF 60-65% no RWMA; MV: thickened leaflets with doming, fixed Posterior leaflet, anterior leaflet w/ large/shaggy & mobile density.  c/w Rheumatic Mitral disease - moderate MS & Mod-Severe MR.   TUBAL LIGATION     VULVA SURGERY       Physical Exam: Blood pressure 122/70, pulse 78, temperature 98.6 F (37 C), temperature source Oral, height 5' (1.524 m), weight 122 lb (55.3 kg), SpO2 100 %. Gen:      No acute distress ENT:  no nasal polyps, mucus membranes moist Lungs:    No increased respiratory effort, symmetric chest wall excursion, clear to auscultation bilaterally, no wheezes or crackles CV:          Regular rate and rhythm; no murmurs, rubs, or gallops.  No pedal edema Abd:      + bowel sounds; soft, non-tender; no distension MSK: no acute synovitis of DIP or PIP joints, no mechanics hands.  Skin:      Warm and dry; no rashes Neuro: normal speech, no focal facial asymmetry Psych: alert and oriented x3, normal mood and affect   Data Reviewed/Medical Decision Making:  Independent interpretation of tests: Imaging:  Review of patient's chest xray Dec 2020 images revealed no acute cardiopulmonary process. The patient's images have been independently reviewed by me.    PFTs: I have personally reviewed the patient's PFTs and spirometry Nov  2018 shows moderate airflow limitation.  PFT Results Latest Ref Rng & Units 07/28/2014  FVC-Pre L 2.16  FVC-Predicted Pre % 71  FVC-Post L 2.31  FVC-Predicted Post % 76  Pre FEV1/FVC % % 71  Post FEV1/FCV % % 73  FEV1-Pre L 1.54  FEV1-Predicted Pre % 64  FEV1-Post L 1.68  DLCO uncorrected ml/min/mmHg 14.87  DLCO UNC% % 76  DLVA Predicted % 90  TLC L 4.26  TLC % Predicted % 94  RV % Predicted % 111    Labs:  Lab Results  Component Value Date   WBC 6.4 06/26/2020   HGB 14.2 06/26/2020   HCT 41.5 06/26/2020   MCV 86.3 06/26/2020   PLT 223.0 06/26/2020   Lab Results  Component Value Date   NA 132 (L) 06/26/2020   K 4.5 06/26/2020   CL 100 06/26/2020   CO2 27 06/26/2020     Immunization status:  Immunization History  Administered Date(s) Administered   Moderna Sars-Covid-2 Vaccination 06/27/2019, 07/25/2019   Pneumococcal Conjugate-13 08/23/2014   Pneumococcal Polysaccharide-23 12/07/2013, 02/26/2014   Tdap 12/07/2013   Zoster, Live 12/07/2013     I reviewed prior external note(s) from Dr. Lake Bells  I reviewed the result(s) of the labs and imaging as noted above.   I have ordered PFT   Assessment:  COPD, progressing symptoms GERD, not well controlled.  OSA on CPAP, controlled.   Plan/Recommendations:  Ms. Sze  has symptoms of worsening dyspnea and fatigue likely related to progressing COPD. Will start albuterol prn and I have counseled her to use before exercise. Will obtain repeat PFTs to evaluate for disease progression.  Her cough may be related to ongoing reflux that she is currently working on diet modification for. Will hold off on PPI therapy at this time.  Continue CPAP  We discussed disease management and progression at length today. I will see her back after PFTs and a trial of albuterol.    Return to Care: Return in about 2 months (around 06/02/2021).  Lenice Llamas, MD [facility]  CC: Biagio Borg, MD

## 2021-04-27 ENCOUNTER — Other Ambulatory Visit: Payer: Self-pay | Admitting: Family

## 2021-05-14 ENCOUNTER — Ambulatory Visit (HOSPITAL_COMMUNITY): Payer: Medicare Other | Attending: Cardiovascular Disease

## 2021-05-14 ENCOUNTER — Other Ambulatory Visit: Payer: Self-pay

## 2021-05-14 DIAGNOSIS — I340 Nonrheumatic mitral (valve) insufficiency: Secondary | ICD-10-CM | POA: Diagnosis not present

## 2021-05-14 DIAGNOSIS — Z8249 Family history of ischemic heart disease and other diseases of the circulatory system: Secondary | ICD-10-CM | POA: Insufficient documentation

## 2021-05-14 DIAGNOSIS — Z952 Presence of prosthetic heart valve: Secondary | ICD-10-CM | POA: Insufficient documentation

## 2021-05-14 DIAGNOSIS — Z9889 Other specified postprocedural states: Secondary | ICD-10-CM | POA: Diagnosis not present

## 2021-05-14 DIAGNOSIS — E785 Hyperlipidemia, unspecified: Secondary | ICD-10-CM | POA: Insufficient documentation

## 2021-05-14 DIAGNOSIS — Z87891 Personal history of nicotine dependence: Secondary | ICD-10-CM | POA: Insufficient documentation

## 2021-05-14 LAB — ECHOCARDIOGRAM COMPLETE
Area-P 1/2: 2.11 cm2
MV VTI: 1 cm2
S' Lateral: 2.7 cm

## 2021-05-22 NOTE — Progress Notes (Signed)
She    Cardiology Office Note   Date:  05/23/2021   ID:  Elaine Le, DOB 1960-12-28, MRN [id number]  PCP:  Biagio Borg, MD  Cardiologist:   Minus Breeding, MD   Chief Complaint  Patient presents with   Mitral Regurgitation      History of Present Illness: Elaine Le is a 61 y.o. female who presents for follow-up after mitral valve replacement. She had probable rheumatic disease with severe regurgitation and moderate stenosis and is now status post minimally invasive valve replacement.   She had a stable replacement with bioprosthesis in Jan 2021.   She had a follow up echo with moderate prosthetic valve regurgitation.  She needs a TEE.  The patient denies any new symptoms such as chest discomfort, neck or arm discomfort. There has been no new shortness of breath, PND or orthopnea. There have been no reported palpitations, presyncope or syncope.    She said that in November she did have shortness of breath while walking the dogs.  She had to stop.  She was fatigued.  However, she said the fatigue is completely improved.  She has not started walking the dogs only because it is cold.  She is not describing PND or orthopnea.  She rarely has some palpitations.  She rarely has some fleeting mid chest discomfort.  She thinks otherwise she feels fine.  She is not had any fevers cough or chills.  As if she could have had a problem related to the COVID-vaccine.   Past Medical History:  Diagnosis Date   Acute respiratory failure with hypercapnia ([place]) 08/04/8117   Acute systolic heart failure (HCC) 09/2014   Adenomatous colon polyp    Arthritis    Back pain    Chronic diastolic heart failure (HCC)    related to MR/MS   Chronic pain syndrome    Chronic sinusitis    CN (constipation) 05/25/2014   Community acquired pneumonia    COPD (chronic obstructive pulmonary disease)-PFT pending  07/17/2014   Diverticulosis    Fatigue    Fibromyalgia    GERD (gastroesophageal reflux  disease)    Headaches, cluster    Hiatal hernia    Hx of adenomatous colonic polyps 03/10/2011   Oct 2012, repeat colon Oct 2017    IBS (irritable bowel syndrome) 01/21/2011   Influenza A 06/29/2014   Internal hemorrhoids    Irritable bowel syndrome (IBS)    Mitral valve regurgitation    severe   Mitral valve stenosis, moderate 07/11/2014   Muscle pain    Muscular deconditioning 07/05/2014   Night sweats    Pleural effusion    Pneumonia    Protein-calorie malnutrition, severe ([place]) 07/06/2014   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/10/2014   Severe mitral regurgitation with moderate mitral stenosis   S/P minimally invasive mitral valve replacement with bioprosthetic valve 11/08/2014   25 mm [facility] Mitral bovine bioprosthetic tissue valve placed via right mini thoracotomy approach   Tobacco abuse     Past Surgical History:  Procedure Laterality Date   CYSTOSTOMY W/ BLADDER DILATION     at age 31   LEFT AND RIGHT HEART CATHETERIZATION WITH CORONARY ANGIOGRAM N/A 07/27/2014   Procedure: LEFT AND RIGHT HEART CATHETERIZATION WITH CORONARY ANGIOGRAM;  Surgeon: Burnell Blanks, MD;  Location: [facility] CATH LAB;  Right left heart catheterization: Mild/minimal coronary artery disease. RV: 50/7/17, PA: 48/29 (mean 38), PCWP 33 mmHg   MITRAL VALVE REPAIR Right 11/08/2014   Procedure:  MINIMALLY INVASIVE MITRAL VALVE REPLACEMENT(MVR);  Surgeon: Rexene Alberts, MD;  Location: [place];  Service: Open Heart Surgery;  Laterality: Right;   NEUROPLASTY / TRANSPOSITION MEDIAN NERVE AT CARPAL TUNNEL BILATERAL     TEE WITHOUT CARDIOVERSION N/A 07/10/2014   Procedure: TRANSESOPHAGEAL ECHOCARDIOGRAM (TEE);  Surgeon: Lelon Perla, MD;  Location: [facility] ENDOSCOPY;  Service: Cardiovascular;  55-60%. No regional wall motion modalities. Moderate mitral stenosis with severe regurgitation and moderate to severely dilated left atrium.   TEE WITHOUT CARDIOVERSION N/A 11/08/2014   Procedure: TRANSESOPHAGEAL  ECHOCARDIOGRAM (TEE);  Surgeon: Rexene Alberts, MD;  Location: [place];  Service: Open Heart Surgery;  Laterality: N/A;   THORACIC OUTLET SURGERY     TRANSTHORACIC ECHOCARDIOGRAM  07/04/2014   Normal LV Size/Fnx - EF 60-65% no RWMA; MV: thickened leaflets with doming, fixed Posterior leaflet, anterior leaflet w/ large/shaggy & mobile density.  c/w Rheumatic Mitral disease - moderate MS & Mod-Severe MR.   TUBAL LIGATION     VULVA SURGERY       Current Outpatient Medications  Medication Sig Dispense Refill   albuterol (VENTOLIN HFA) 108 (90 Base) MCG/ACT inhaler Inhale 2 puffs into the lungs every 6 (six) hours as needed. 18 g 5   betamethasone dipropionate 0.05 % cream Apply topically.     EUCRISA 2 % OINT Apply 1 a small amount to skin twice a day  hands and neck     FINACEA 15 % FOAM Apply topically.     fluconazole (DIFLUCAN) 150 MG tablet TAKE 1 TABLET BY MOUTH EVERY 3 DAYS AS NEEDED 2 tablet 1   liothyronine (CYTOMEL) 5 MCG tablet Take 5 mcg by mouth daily.   2   metroNIDAZOLE (METROCREAM) 0.75 % cream Apply 1 application topically 2 (two) times daily.     SYNTHROID 75 MCG tablet Take 75 mcg by mouth daily before breakfast.      No current facility-administered medications for this visit.    Allergies:   Estradiol, Metoclopramide, Warfarin and related, Other, Cefaclor, Montelukast sodium, Morphine and related, and Statins    ROS:  Please see the history of present illness.   Otherwise, review of systems are positive for balance issues. All other systems are reviewed and negative.    PHYSICAL EXAM: VS:  BP 116/68 (BP Location: Left Arm, Patient Position: Sitting, Cuff Size: Normal)    Pulse 84    Ht 5' (1.524 m)    Wt 120 lb (54.4 kg)    SpO2 100%    BMI 23.44 kg/m  , BMI Body mass index is 23.44 kg/m.  GENERAL:  Well appearing NECK:  No jugular venous distention, waveform within normal limits, carotid upstroke brisk and symmetric, no bruits, no thyromegaly LUNGS:  Clear to  auscultation bilaterally CHEST:  Unremarkable HEART:  PMI not displaced or sustained,S1 and S2 within normal limits, no S3, no S4, no clicks, no rubs, 3 out of 6 apical holosystolic murmur radiating anteriorly and also to the axilla, no diastolic murmurs ABD:  Flat, positive bowel sounds normal in frequency in pitch, no bruits, no rebound, no guarding, no midline pulsatile mass, no hepatomegaly, no splenomegaly EXT:  2 plus pulses throughout, no edema, no cyanosis no clubbing   EKG:  EKG is   ordered today. Sinus rhythm, rate 84 axis within normal limits, intervals within normal limits, no acute ST-T wave changes.  PVC  Recent Labs: 11/09/2014: Magnesium 2.1 01/10/2015: ALT 27; BUN 9; Creatinine, Ser 0.70; Potassium 4.3; Sodium 139 02/01/2015: Hemoglobin  15.1*; Platelets 292.0 03/29/2015: TSH 4.03    Lipid Panel    Component Value Date/Time   CHOL 256 (H) 06/26/2020 1124   TRIG 50.0 06/26/2020 1124   HDL 73.50 06/26/2020 1124   CHOLHDL 3 06/26/2020 1124   VLDL 10.0 06/26/2020 1124   LDLCALC 172 (H) 06/26/2020 1124      Wt Readings from Last 3 Encounters:  05/23/21 120 lb (54.4 kg)  04/01/21 122 lb (55.3 kg)  06/26/20 125 lb (56.7 kg)      Other studies Reviewed: Additional studies/ records that were reviewed today include:  Labs    ASSESSMENT AND PLAN:  MV REPAIR:  She is in need of TEE.  We will arrange this.    DYSLIPIDEMIA:   She did not want to take PCSK9 inhibitor and is intolerant of other statins.  SLEEP APNEA:   I have previously asked her to follow-up with her sleep doctors. She has this scheduled.    Current medicines are reviewed at length with the patient today.  The patient does not have concerns regarding medicines.  The following changes have been made: None  Labs/ tests ordered today include:    Disposition:   FU with me in after the TEE   Signed, Minus Breeding, MD  05/23/2021 3:24 PM    [facility]

## 2021-05-22 NOTE — H&P (View-Only) (Signed)
She    Cardiology Office Note   Date:  05/23/2021   ID:  Elaine Le, DOB 03-12-1961, MRN [id number]  PCP:  Biagio Borg, MD  Cardiologist:   Minus Breeding, MD   Chief Complaint  Patient presents with   Mitral Regurgitation      History of Present Illness: Elaine Le is a 61 y.o. female who presents for follow-up after mitral valve replacement. She had probable rheumatic disease with severe regurgitation and moderate stenosis and is now status post minimally invasive valve replacement.   She had a stable replacement with bioprosthesis in Jan 2021.   She had a follow up echo with moderate prosthetic valve regurgitation.  She needs a TEE.  The patient denies any new symptoms such as chest discomfort, neck or arm discomfort. There has been no new shortness of breath, PND or orthopnea. There have been no reported palpitations, presyncope or syncope.    She said that in November she did have shortness of breath while walking the dogs.  She had to stop.  She was fatigued.  However, she said the fatigue is completely improved.  She has not started walking the dogs only because it is cold.  She is not describing PND or orthopnea.  She rarely has some palpitations.  She rarely has some fleeting mid chest discomfort.  She thinks otherwise she feels fine.  She is not had any fevers cough or chills.  As if she could have had a problem related to the COVID-vaccine.   Past Medical History:  Diagnosis Date   Acute respiratory failure with hypercapnia ([place]) 7/32/2025   Acute systolic heart failure (HCC) 09/2014   Adenomatous colon polyp    Arthritis    Back pain    Chronic diastolic heart failure (HCC)    related to MR/MS   Chronic pain syndrome    Chronic sinusitis    CN (constipation) 05/25/2014   Community acquired pneumonia    COPD (chronic obstructive pulmonary disease)-PFT pending  07/17/2014   Diverticulosis    Fatigue    Fibromyalgia    GERD (gastroesophageal reflux  disease)    Headaches, cluster    Hiatal hernia    Hx of adenomatous colonic polyps 03/10/2011   Oct 2012, repeat colon Oct 2017    IBS (irritable bowel syndrome) 01/21/2011   Influenza A 06/29/2014   Internal hemorrhoids    Irritable bowel syndrome (IBS)    Mitral valve regurgitation    severe   Mitral valve stenosis, moderate 07/11/2014   Muscle pain    Muscular deconditioning 07/05/2014   Night sweats    Pleural effusion    Pneumonia    Protein-calorie malnutrition, severe ([place]) 07/06/2014   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/10/2014   Severe mitral regurgitation with moderate mitral stenosis   S/P minimally invasive mitral valve replacement with bioprosthetic valve 11/08/2014   25 mm [facility] Mitral bovine bioprosthetic tissue valve placed via right mini thoracotomy approach   Tobacco abuse     Past Surgical History:  Procedure Laterality Date   CYSTOSTOMY W/ BLADDER DILATION     at age 31   LEFT AND RIGHT HEART CATHETERIZATION WITH CORONARY ANGIOGRAM N/A 07/27/2014   Procedure: LEFT AND RIGHT HEART CATHETERIZATION WITH CORONARY ANGIOGRAM;  Surgeon: Burnell Blanks, MD;  Location: [facility] CATH LAB;  Right left heart catheterization: Mild/minimal coronary artery disease. RV: 50/7/17, PA: 48/29 (mean 38), PCWP 33 mmHg   MITRAL VALVE REPAIR Right 11/08/2014   Procedure:  MINIMALLY INVASIVE MITRAL VALVE REPLACEMENT(MVR);  Surgeon: Rexene Alberts, MD;  Location: [place];  Service: Open Heart Surgery;  Laterality: Right;   NEUROPLASTY / TRANSPOSITION MEDIAN NERVE AT CARPAL TUNNEL BILATERAL     TEE WITHOUT CARDIOVERSION N/A 07/10/2014   Procedure: TRANSESOPHAGEAL ECHOCARDIOGRAM (TEE);  Surgeon: Lelon Perla, MD;  Location: [facility] ENDOSCOPY;  Service: Cardiovascular;  55-60%. No regional wall motion modalities. Moderate mitral stenosis with severe regurgitation and moderate to severely dilated left atrium.   TEE WITHOUT CARDIOVERSION N/A 11/08/2014   Procedure: TRANSESOPHAGEAL  ECHOCARDIOGRAM (TEE);  Surgeon: Rexene Alberts, MD;  Location: [place];  Service: Open Heart Surgery;  Laterality: N/A;   THORACIC OUTLET SURGERY     TRANSTHORACIC ECHOCARDIOGRAM  07/04/2014   Normal LV Size/Fnx - EF 60-65% no RWMA; MV: thickened leaflets with doming, fixed Posterior leaflet, anterior leaflet w/ large/shaggy & mobile density.  c/w Rheumatic Mitral disease - moderate MS & Mod-Severe MR.   TUBAL LIGATION     VULVA SURGERY       Current Outpatient Medications  Medication Sig Dispense Refill   albuterol (VENTOLIN HFA) 108 (90 Base) MCG/ACT inhaler Inhale 2 puffs into the lungs every 6 (six) hours as needed. 18 g 5   betamethasone dipropionate 0.05 % cream Apply topically.     EUCRISA 2 % OINT Apply 1 a small amount to skin twice a day  hands and neck     FINACEA 15 % FOAM Apply topically.     fluconazole (DIFLUCAN) 150 MG tablet TAKE 1 TABLET BY MOUTH EVERY 3 DAYS AS NEEDED 2 tablet 1   liothyronine (CYTOMEL) 5 MCG tablet Take 5 mcg by mouth daily.   2   metroNIDAZOLE (METROCREAM) 0.75 % cream Apply 1 application topically 2 (two) times daily.     SYNTHROID 75 MCG tablet Take 75 mcg by mouth daily before breakfast.      No current facility-administered medications for this visit.    Allergies:   Estradiol, Metoclopramide, Warfarin and related, Other, Cefaclor, Montelukast sodium, Morphine and related, and Statins    ROS:  Please see the history of present illness.   Otherwise, review of systems are positive for balance issues. All other systems are reviewed and negative.    PHYSICAL EXAM: VS:  BP 116/68 (BP Location: Left Arm, Patient Position: Sitting, Cuff Size: Normal)    Pulse 84    Ht 5' (1.524 m)    Wt 120 lb (54.4 kg)    SpO2 100%    BMI 23.44 kg/m  , BMI Body mass index is 23.44 kg/m.  GENERAL:  Well appearing NECK:  No jugular venous distention, waveform within normal limits, carotid upstroke brisk and symmetric, no bruits, no thyromegaly LUNGS:  Clear to  auscultation bilaterally CHEST:  Unremarkable HEART:  PMI not displaced or sustained,S1 and S2 within normal limits, no S3, no S4, no clicks, no rubs, 3 out of 6 apical holosystolic murmur radiating anteriorly and also to the axilla, no diastolic murmurs ABD:  Flat, positive bowel sounds normal in frequency in pitch, no bruits, no rebound, no guarding, no midline pulsatile mass, no hepatomegaly, no splenomegaly EXT:  2 plus pulses throughout, no edema, no cyanosis no clubbing   EKG:  EKG is   ordered today. Sinus rhythm, rate 84 axis within normal limits, intervals within normal limits, no acute ST-T wave changes.  PVC  Recent Labs: 11/09/2014: Magnesium 2.1 01/10/2015: ALT 27; BUN 9; Creatinine, Ser 0.70; Potassium 4.3; Sodium 139 02/01/2015: Hemoglobin  15.1*; Platelets 292.0 03/29/2015: TSH 4.03    Lipid Panel    Component Value Date/Time   CHOL 256 (H) 06/26/2020 1124   TRIG 50.0 06/26/2020 1124   HDL 73.50 06/26/2020 1124   CHOLHDL 3 06/26/2020 1124   VLDL 10.0 06/26/2020 1124   LDLCALC 172 (H) 06/26/2020 1124      Wt Readings from Last 3 Encounters:  05/23/21 120 lb (54.4 kg)  04/01/21 122 lb (55.3 kg)  06/26/20 125 lb (56.7 kg)      Other studies Reviewed: Additional studies/ records that were reviewed today include:  Labs    ASSESSMENT AND PLAN:  MV REPAIR:  She is in need of TEE.  We will arrange this.    DYSLIPIDEMIA:   She did not want to take PCSK9 inhibitor and is intolerant of other statins.  SLEEP APNEA:   I have previously asked her to follow-up with her sleep doctors. She has this scheduled.    Current medicines are reviewed at length with the patient today.  The patient does not have concerns regarding medicines.  The following changes have been made: None  Labs/ tests ordered today include:    Disposition:   FU with me in after the TEE   Signed, Minus Breeding, MD  05/23/2021 3:24 PM    [facility]

## 2021-05-23 ENCOUNTER — Other Ambulatory Visit: Payer: Self-pay

## 2021-05-23 ENCOUNTER — Ambulatory Visit (INDEPENDENT_AMBULATORY_CARE_PROVIDER_SITE_OTHER): Payer: Medicare Other | Admitting: Cardiology

## 2021-05-23 ENCOUNTER — Encounter: Payer: Self-pay | Admitting: Cardiology

## 2021-05-23 VITALS — BP 116/68 | HR 84 | Ht 60.0 in | Wt 120.0 lb

## 2021-05-23 DIAGNOSIS — E785 Hyperlipidemia, unspecified: Secondary | ICD-10-CM | POA: Diagnosis not present

## 2021-05-23 DIAGNOSIS — G4730 Sleep apnea, unspecified: Secondary | ICD-10-CM

## 2021-05-23 DIAGNOSIS — I340 Nonrheumatic mitral (valve) insufficiency: Secondary | ICD-10-CM

## 2021-05-23 NOTE — Patient Instructions (Addendum)
Medication Instructions:  Your Physician recommend you continue on your current medication as directed.    *If you need a refill on your cardiac medications before your next appointment, please call your pharmacy*   Lab Work: Today bmet, cbc tsh If you have labs (blood work) drawn today and your tests are completely normal, you will receive your results only by: [place] (if you have MyChart) OR A paper copy in the mail If you have any lab test that is abnormal or we need to change your treatment, we will call you to review the results.   Testing/Procedures: TEE follow instructions below.   Follow-Up: At [facility], you and your health needs are our priority.  As part of our continuing mission to provide you with exceptional heart care, we have created designated Provider Care Teams.  These Care Teams include your primary Cardiologist (physician) and Advanced Practice Providers (APPs -  Physician Assistants and Nurse Practitioners) who all work together to provide you with the care you need, when you need it.  We recommend signing up for the patient portal called "MyChart".  Sign up information is provided on this After Visit Summary.  MyChart is used to connect with patients for Virtual Visits (Telemedicine).  Patients are able to view lab/test results, encounter notes, upcoming appointments, etc.  Non-urgent messages can be sent to your provider as well.   To learn more about what you can do with MyChart, go to [email or web address].    Your next appointment:   After TEE  The format for your next appointment:   In Person  Provider:   Minus Breeding, MD    You are scheduled for a TEE on Friday June 07, 2021 with Dr. Radford Pax.  Please arrive at the [facility] (Main Entrance A) at [facility]: [address] [place], [place] [address] at 11 am. (1 hour prior to procedure unless lab work is needed; if lab work is needed arrive 1.5 hours ahead)  DIET: Nothing  to eat or drink after midnight except a sip of water with medications (see medication instructions below)  FYI: For your safety, and to allow [place] to monitor your vital signs accurately during the surgery/procedure we request that   if you have artificial nails, gel coating, SNS etc. Please have those removed prior to your surgery/procedure. Not having the nail coverings /polish removed may result in cancellation or delay of your surgery/procedure.   Labs: see above labs   You must have a responsible person to drive you home and stay in the waiting area during your procedure. Failure to do so could result in cancellation.  Bring your insurance cards.  *Special Note: Every effort is made to have your procedure done on time. Occasionally there are emergencies that occur at the hospital that may cause delays. Please be patient if a delay does occur.

## 2021-05-24 LAB — CBC
Hematocrit: 41.7 % (ref 34.0–46.6)
Hemoglobin: 14 g/dL (ref 11.1–15.9)
MCH: 28.5 pg (ref 26.6–33.0)
MCHC: 33.6 g/dL (ref 31.5–35.7)
MCV: 85 fL (ref 79–97)
Platelets: 241 10*3/uL (ref 150–450)
RBC: 4.91 x10E6/uL (ref 3.77–5.28)
RDW: 13 % (ref 11.7–15.4)
WBC: 8 10*3/uL (ref 3.4–10.8)

## 2021-05-24 LAB — BASIC METABOLIC PANEL
BUN/Creatinine Ratio: 11 — ABNORMAL LOW (ref 12–28)
BUN: 7 mg/dL — ABNORMAL LOW (ref 8–27)
CO2: 19 mmol/L — ABNORMAL LOW (ref 20–29)
Calcium: 9.6 mg/dL (ref 8.7–10.3)
Chloride: 101 mmol/L (ref 96–106)
Creatinine, Ser: 0.62 mg/dL (ref 0.57–1.00)
Glucose: 87 mg/dL (ref 70–99)
Potassium: 4.3 mmol/L (ref 3.5–5.2)
Sodium: 139 mmol/L (ref 134–144)
eGFR: 102 mL/min/{1.73_m2} (ref >59)

## 2021-05-24 LAB — TSH: TSH: 0.288 u[IU]/mL — ABNORMAL LOW (ref 0.450–4.500)

## 2021-05-24 NOTE — Addendum Note (Signed)
Addended by: Orma Render on: 05/24/2021 03:05 PM   Modules accepted: Orders

## 2021-05-28 ENCOUNTER — Telehealth: Payer: Self-pay | Admitting: Cardiology

## 2021-05-28 NOTE — Telephone Encounter (Signed)
Patient is requesting a copy of her most recent lab results. She states she would also like to discuss her thyroid.

## 2021-05-28 NOTE — Telephone Encounter (Signed)
Spoke with patient who requested a copy of her recent lab results. These were mailed to her.

## 2021-05-30 ENCOUNTER — Encounter (HOSPITAL_COMMUNITY): Payer: Self-pay | Admitting: Cardiology

## 2021-06-01 NOTE — Addendum Note (Signed)
Addended by: Minus Breeding on: 06/01/2021 08:18 PM   Modules accepted: Orders

## 2021-06-03 ENCOUNTER — Ambulatory Visit: Payer: Medicare Other | Admitting: Internal Medicine

## 2021-06-07 ENCOUNTER — Ambulatory Visit (HOSPITAL_COMMUNITY)
Admission: RE | Admit: 2021-06-07 | Discharge: 2021-06-07 | Disposition: A | Discharge status: Home / Self Care | Payer: Medicare Other | Attending: Cardiology | Admitting: Cardiology

## 2021-06-07 ENCOUNTER — Ambulatory Visit (HOSPITAL_BASED_OUTPATIENT_CLINIC_OR_DEPARTMENT_OTHER)
Admission: RE | Admit: 2021-06-07 | Discharge: 2021-06-07 | Disposition: A | Discharge status: Home / Self Care | Payer: Medicare Other | Source: Home / Self Care | Attending: Cardiology | Admitting: Cardiology

## 2021-06-07 ENCOUNTER — Encounter (HOSPITAL_COMMUNITY)
Admission: RE | Disposition: A | Discharge status: Home / Self Care | Payer: Self-pay | Source: Home / Self Care | Attending: Cardiology

## 2021-06-07 ENCOUNTER — Ambulatory Visit (HOSPITAL_BASED_OUTPATIENT_CLINIC_OR_DEPARTMENT_OTHER): Payer: Medicare Other | Admitting: Registered Nurse

## 2021-06-07 ENCOUNTER — Ambulatory Visit (HOSPITAL_COMMUNITY): Payer: Medicare Other | Admitting: Registered Nurse

## 2021-06-07 ENCOUNTER — Other Ambulatory Visit: Payer: Self-pay

## 2021-06-07 DIAGNOSIS — Z87891 Personal history of nicotine dependence: Secondary | ICD-10-CM | POA: Diagnosis not present

## 2021-06-07 DIAGNOSIS — E039 Hypothyroidism, unspecified: Secondary | ICD-10-CM | POA: Diagnosis not present

## 2021-06-07 DIAGNOSIS — Z953 Presence of xenogenic heart valve: Secondary | ICD-10-CM | POA: Insufficient documentation

## 2021-06-07 DIAGNOSIS — I700 Atherosclerosis of aorta: Secondary | ICD-10-CM | POA: Diagnosis not present

## 2021-06-07 DIAGNOSIS — J449 Chronic obstructive pulmonary disease, unspecified: Secondary | ICD-10-CM

## 2021-06-07 DIAGNOSIS — I2510 Atherosclerotic heart disease of native coronary artery without angina pectoris: Secondary | ICD-10-CM

## 2021-06-07 DIAGNOSIS — E785 Hyperlipidemia, unspecified: Secondary | ICD-10-CM | POA: Insufficient documentation

## 2021-06-07 DIAGNOSIS — I088 Other rheumatic multiple valve diseases: Secondary | ICD-10-CM

## 2021-06-07 DIAGNOSIS — I340 Nonrheumatic mitral (valve) insufficiency: Secondary | ICD-10-CM | POA: Diagnosis not present

## 2021-06-07 DIAGNOSIS — I059 Rheumatic mitral valve disease, unspecified: Secondary | ICD-10-CM

## 2021-06-07 DIAGNOSIS — I052 Rheumatic mitral stenosis with insufficiency: Secondary | ICD-10-CM | POA: Diagnosis present

## 2021-06-07 HISTORY — PX: TEE WITHOUT CARDIOVERSION: SHX5443

## 2021-06-07 SURGERY — ECHOCARDIOGRAM, TRANSESOPHAGEAL
Anesthesia: Monitor Anesthesia Care

## 2021-06-07 MED ORDER — PROPOFOL 500 MG/50ML IV EMUL
INTRAVENOUS | Status: DC | PRN
  Administered 2021-06-07: 75 ug/kg/min via INTRAVENOUS

## 2021-06-07 MED ORDER — PHENYLEPHRINE 40 MCG/ML (10ML) SYRINGE FOR IV PUSH (FOR BLOOD PRESSURE SUPPORT)
PREFILLED_SYRINGE | INTRAVENOUS | Status: DC | PRN
  Administered 2021-06-07: 80 ug via INTRAVENOUS

## 2021-06-07 MED ORDER — PROPOFOL 10 MG/ML IV BOLUS
INTRAVENOUS | Status: DC | PRN
  Administered 2021-06-07 (×2): 30 mg via INTRAVENOUS

## 2021-06-07 MED ORDER — SODIUM CHLORIDE 0.9 % IV SOLN: INTRAVENOUS | Status: DC

## 2021-06-07 MED ORDER — LIDOCAINE 2% (20 MG/ML) 5 ML SYRINGE
INTRAMUSCULAR | Status: DC | PRN
  Administered 2021-06-07: 60 mg via INTRAVENOUS

## 2021-06-07 NOTE — CV Procedure (Signed)
° ° °  PROCEDURE NOTE:  Procedure:  Transesophageal echocardiogram Operator:  [name], MD Indications:  Mitral Regurgitation Complications: None  During this procedure the patient is administered a total of Lidocaine 60mg  and Propofol 190 mg to achieve and maintain moderate conscious sedation.  The patient's heart rate, blood pressure, and oxygen saturation are monitored continuously during the procedure by anesthesia.   Results: Normal LV size and function Normal RV size and function Normal RA Mildly dilated LA and normal LA appendage with no evidence of thrombus. Normal TV with trivial TR Normal PV with trivial PR S/P 93mm bioprosthetic MVR with thickened leaflets.  There appears to be some teathering of the leaflets.  There is moderate eccentric mitral regurgitation directed anteriorly with moderate mitral stenosis with mean MVG of 61mmHg.  Normal trileaflet AV Normal interatrial septum with no evidence of shunt by colorflow dopper  Mild atherosclerotic plaque in the aortic root and ascending and descending aorta  The patient tolerated the procedure well and was transferred back to their room in stable condition.  Signed: [name], MD [facility]

## 2021-06-07 NOTE — Progress Notes (Signed)
°  Echocardiogram Echocardiogram Transesophageal has been performed.  Elaine Le 06/07/2021, 1:55 PM

## 2021-06-07 NOTE — Anesthesia Preprocedure Evaluation (Signed)
Anesthesia Evaluation  Patient identified by MRN, date of birth, ID band Patient awake    Reviewed: Allergy & Precautions, NPO status , Patient's Chart, lab work & pertinent test results  Airway Mallampati: II  TM Distance: >3 FB Neck ROM: Full    Dental  (+) Teeth Intact, Dental Advisory Given   Pulmonary COPD,  COPD inhaler, former smoker,    Pulmonary exam normal breath sounds clear to auscultation       Cardiovascular (-) angina+ CAD  (-) Past MI + Valvular Problems/Murmurs (s/p mini MVR 11/08/2014) MR  Rhythm:Regular Rate:Normal + Systolic murmurs    Neuro/Psych  Headaches,    GI/Hepatic Neg liver ROS, hiatal hernia, GERD  ,  Endo/Other  Hypothyroidism   Renal/GU negative Renal ROS     Musculoskeletal  (+) Arthritis , Fibromyalgia -  Abdominal   Peds  Hematology negative hematology ROS (+)   Anesthesia Other Findings Day of surgery medications reviewed with the patient.  Reproductive/Obstetrics                             Anesthesia Physical Anesthesia Plan  ASA: 3  Anesthesia Plan: MAC   Post-op Pain Management:    Induction: Intravenous  PONV Risk Score and Plan: 2 and Treatment may vary due to age or medical condition and TIVA  Airway Management Planned: Natural Airway and Nasal Cannula  Additional Equipment:   Intra-op Plan:   Post-operative Plan:   Informed Consent: I have reviewed the patients History and Physical, chart, labs and discussed the procedure including the risks, benefits and alternatives for the proposed anesthesia with the patient or authorized representative who has indicated his/her understanding and acceptance.     Dental advisory given  Plan Discussed with: CRNA  Anesthesia Plan Comments:         Anesthesia Quick Evaluation

## 2021-06-07 NOTE — Transfer of Care (Signed)
Immediate Anesthesia Transfer of Care Note  Patient: Elaine Le  Procedure(s) Performed: TRANSESOPHAGEAL ECHOCARDIOGRAM (TEE)  Patient Location: PACU  Anesthesia Type:MAC  Level of Consciousness: awake, alert  and oriented  Airway & Oxygen Therapy: Patient Spontanous Breathing and Patient connected to nasal cannula oxygen  Post-op Assessment: Report given to RN and Post -op Vital signs reviewed and stable  Post vital signs: Reviewed and stable  Last Vitals:  Vitals Value Taken Time  BP 104/53 06/07/21 1240  Temp    Pulse 87 06/07/21 1241  Resp 19 06/07/21 1241  SpO2 97 % 06/07/21 1241  Vitals shown include unvalidated device data.  Last Pain:  Vitals:   06/07/21 1115  TempSrc: Temporal  PainSc: 0-No pain         Complications: No notable events documented.

## 2021-06-07 NOTE — Interval H&P Note (Signed)
History and Physical Interval Note:  06/07/2021 10:56 AM  Elaine Le  has presented today for surgery, with the diagnosis of MITRAL REGURGITATION.  The various methods of treatment have been discussed with the patient and family. After consideration of risks, benefits and other options for treatment, the patient has consented to  Procedure(s): TRANSESOPHAGEAL ECHOCARDIOGRAM (TEE) (N/A) as a surgical intervention.  The patient's history has been reviewed, patient examined, no change in status, stable for surgery.  I have reviewed the patient's chart and labs.  Questions were answered to the patient's satisfaction.     [name]

## 2021-06-07 NOTE — Anesthesia Procedure Notes (Signed)
Procedure Name: MAC Date/Time: 06/07/2021 12:05 PM Performed by: Trinna Post., CRNA Pre-anesthesia Checklist: Patient identified, Emergency Drugs available, Suction available, Patient being monitored and Timeout performed Patient Re-evaluated:Patient Re-evaluated prior to induction Oxygen Delivery Method: Nasal cannula Preoxygenation: Pre-oxygenation with 100% oxygen Induction Type: IV induction Placement Confirmation: positive ETCO2

## 2021-06-09 ENCOUNTER — Encounter (HOSPITAL_COMMUNITY): Payer: Self-pay | Admitting: Cardiology

## 2021-06-10 NOTE — Anesthesia Postprocedure Evaluation (Signed)
Anesthesia Post Note  Patient: Elaine Le  Procedure(s) Performed: TRANSESOPHAGEAL ECHOCARDIOGRAM (TEE)     Patient location during evaluation: Endoscopy Anesthesia Type: MAC Level of consciousness: awake and alert Pain management: pain level controlled Vital Signs Assessment: post-procedure vital signs reviewed and stable Respiratory status: spontaneous breathing, nonlabored ventilation, respiratory function stable and patient connected to nasal cannula oxygen Cardiovascular status: stable and blood pressure returned to baseline Postop Assessment: no apparent nausea or vomiting Anesthetic complications: no   No notable events documented.  Last Vitals:  Vitals:   06/07/21 1255 06/07/21 1310  BP: 109/72 117/68  Pulse: 77 75  Resp: 17 (!) 21  Temp:  (!) 36.1 C  SpO2: 95% 98%    Last Pain:  Vitals:   06/07/21 1310  TempSrc:   PainSc: 0-No pain                 Felisa Zechman

## 2021-06-16 NOTE — Progress Notes (Signed)
She    Cardiology Office Note   Date:  06/17/2021   ID:  Elaine Le, DOB Feb 20, 1961, MRN [id number]  PCP:  Biagio Borg, MD  Cardiologist:   Minus Breeding, MD   Chief Complaint  Patient presents with   Mitral Regurgitation      History of Present Illness: Elaine Le is a 61 y.o. female who presents for follow-up after mitral valve replacement. She had probable rheumatic disease with severe regurgitation and moderate stenosis and is now status post minimally invasive valve replacement.   She had a stable replacement with bioprosthesis in Jan 2021.   She had a follow up echo with moderate prosthetic valve regurgitation.  She presents now after a TEE.  She has moderate MR with mild stenosis and a mean gradient of  52mm Hg.  There was some tethering of the leaflets and the regurgitant flow was directed anteriorly.   Past Medical History:  Diagnosis Date   Acute respiratory failure with hypercapnia ([place]) 8/78/6767   Acute systolic heart failure (HCC) 09/2014   Adenomatous colon polyp    Arthritis    Back pain    Chronic diastolic heart failure (HCC)    related to MR/MS   Chronic pain syndrome    Chronic sinusitis    CN (constipation) 05/25/2014   Community acquired pneumonia    COPD (chronic obstructive pulmonary disease)-PFT pending  07/17/2014   Diverticulosis    Fatigue    Fibromyalgia    GERD (gastroesophageal reflux disease)    Headaches, cluster    Hiatal hernia    Hx of adenomatous colonic polyps 03/10/2011   Oct 2012, repeat colon Oct 2017    IBS (irritable bowel syndrome) 01/21/2011   Influenza A 06/29/2014   Internal hemorrhoids    Irritable bowel syndrome (IBS)    Mitral valve regurgitation    severe   Mitral valve stenosis, moderate 07/11/2014   Muscle pain    Muscular deconditioning 07/05/2014   Night sweats    Pleural effusion    Pneumonia    Protein-calorie malnutrition, severe ([place]) 07/06/2014   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/10/2014    Severe mitral regurgitation with moderate mitral stenosis   S/P minimally invasive mitral valve replacement with bioprosthetic valve 11/08/2014   25 mm [facility] Mitral bovine bioprosthetic tissue valve placed via right mini thoracotomy approach   Tobacco abuse     Past Surgical History:  Procedure Laterality Date   CYSTOSTOMY W/ BLADDER DILATION     at age 33   LEFT AND RIGHT HEART CATHETERIZATION WITH CORONARY ANGIOGRAM N/A 07/27/2014   Procedure: LEFT AND RIGHT HEART CATHETERIZATION WITH CORONARY ANGIOGRAM;  Surgeon: Burnell Blanks, MD;  Location: [facility] CATH LAB;  Right left heart catheterization: Mild/minimal coronary artery disease. RV: 50/7/17, PA: 48/29 (mean 38), PCWP 33 mmHg   MITRAL VALVE REPAIR Right 11/08/2014   Procedure: MINIMALLY INVASIVE MITRAL VALVE REPLACEMENT(MVR);  Surgeon: Rexene Alberts, MD;  Location: [place];  Service: Open Heart Surgery;  Laterality: Right;   NEUROPLASTY / TRANSPOSITION MEDIAN NERVE AT CARPAL TUNNEL BILATERAL     TEE WITHOUT CARDIOVERSION N/A 07/10/2014   Procedure: TRANSESOPHAGEAL ECHOCARDIOGRAM (TEE);  Surgeon: Lelon Perla, MD;  Location: [facility] ENDOSCOPY;  Service: Cardiovascular;  55-60%. No regional wall motion modalities. Moderate mitral stenosis with severe regurgitation and moderate to severely dilated left atrium.   TEE WITHOUT CARDIOVERSION N/A 11/08/2014   Procedure: TRANSESOPHAGEAL ECHOCARDIOGRAM (TEE);  Surgeon: Rexene Alberts, MD;  Location: [facility]  OR;  Service: Open Heart Surgery;  Laterality: N/A;   TEE WITHOUT CARDIOVERSION N/A 06/07/2021   Procedure: TRANSESOPHAGEAL ECHOCARDIOGRAM (TEE);  Surgeon: Sueanne Margarita, MD;  Location: [facility] ENDOSCOPY;  Service: Cardiovascular;  Laterality: N/A;   THORACIC OUTLET SURGERY     TRANSTHORACIC ECHOCARDIOGRAM  07/04/2014   Normal LV Size/Fnx - EF 60-65% no RWMA; MV: thickened leaflets with doming, fixed Posterior leaflet, anterior leaflet w/ large/shaggy & mobile density.  c/w Rheumatic Mitral disease -  moderate MS & Mod-Severe MR.   TUBAL LIGATION     VULVA SURGERY       Current Outpatient Medications  Medication Sig Dispense Refill   Antiseborrheic Products, Misc. (PROMISEB) CREA Apply 1 application topically daily. Face     clobetasol (TEMOVATE) 0.05 % external solution Apply 1 application topically as needed (Scalp).     liothyronine (CYTOMEL) 5 MCG tablet Take 5 mcg by mouth daily.   2   metroNIDAZOLE (METROCREAM) 0.75 % cream Apply 1 application topically daily. Face     SYNTHROID 75 MCG tablet Take 75 mcg by mouth daily before breakfast.      albuterol (VENTOLIN HFA) 108 (90 Base) MCG/ACT inhaler Inhale 2 puffs into the lungs every 6 (six) hours as needed. (Patient not taking: Reported on 06/17/2021) 18 g 5   No current facility-administered medications for this visit.    Allergies:   Estradiol, Metoclopramide, Warfarin and related, Other, Cefaclor, Montelukast sodium, Morphine and related, and Statins    ROS:  Please see the history of present illness.   Otherwise, review of systems are positive for none . All other systems are reviewed and negative.    PHYSICAL EXAM: VS:  BP 120/74    Pulse 62    Ht 5' (1.524 m)    Wt 123 lb 3.2 oz (55.9 kg)    BMI 24.06 kg/m  , BMI Body mass index is 24.06 kg/m.  GENERAL:  Well appearing NECK:  No jugular venous distention, waveform within normal limits, carotid upstroke brisk and symmetric, no bruits, no thyromegaly LUNGS:  Clear to auscultation bilaterally CHEST:  Unremarkable HEART:  PMI not displaced or sustained,S1 and S2 within normal limits, no S3, no S4, no clicks, no rubs, 3 out of 6 holosystolic left sternal mid border murmur, no diastolic murmurs ABD:  Flat, positive bowel sounds normal in frequency in pitch, no bruits, no rebound, no guarding, no midline pulsatile mass, no hepatomegaly, no splenomegaly EXT:  2 plus pulses throughout, no edema, no cyanosis no clubbing   EKG:  EKG is not ordered today.  Recent  Labs: 11/09/2014: Magnesium 2.1 01/10/2015: ALT 27; BUN 9; Creatinine, Ser 0.70; Potassium 4.3; Sodium 139 02/01/2015: Hemoglobin 15.1*; Platelets 292.0 03/29/2015: TSH 4.03    Lipid Panel    Component Value Date/Time   CHOL 256 (H) 06/26/2020 1124   TRIG 50.0 06/26/2020 1124   HDL 73.50 06/26/2020 1124   CHOLHDL 3 06/26/2020 1124   VLDL 10.0 06/26/2020 1124   LDLCALC 172 (H) 06/26/2020 1124      Wt Readings from Last 3 Encounters:  06/17/21 123 lb 3.2 oz (55.9 kg)  06/07/21 117 lb (53.1 kg)  05/23/21 120 lb (54.4 kg)      Other studies Reviewed: Additional studies/ records that were reviewed today include:  TEE    ASSESSMENT AND PLAN:  MV REPAIR:   TEE suggested moderate regurgitation.   I think that I can follow this with a follow-up echocardiogram in 6 months transthoracic.  I likely  will do another TEE in a year pending the results of this follow-up echo.  She has no new symptoms.  No other change in therapy.   DYSLIPIDEMIA:   She did not want to take PCSK9 inhibitors and is intolerant of statins.   SLEEP APNEA:   I have previously asked her to follow-up with her sleep doctors. She has this scheduled.    Current medicines are reviewed at length with the patient today.  The patient does not have concerns regarding medicines.  The following changes have been made: None  Labs/ tests ordered today include:    Disposition:   FU with me in six months.    Signed, Minus Breeding, MD  06/17/2021 12:01 PM    [place]

## 2021-06-17 ENCOUNTER — Other Ambulatory Visit: Payer: Self-pay

## 2021-06-17 ENCOUNTER — Encounter: Payer: Self-pay | Admitting: Cardiology

## 2021-06-17 ENCOUNTER — Ambulatory Visit (INDEPENDENT_AMBULATORY_CARE_PROVIDER_SITE_OTHER): Payer: Medicare Other | Admitting: Cardiology

## 2021-06-17 VITALS — BP 120/74 | HR 62 | Ht 60.0 in | Wt 123.2 lb

## 2021-06-17 DIAGNOSIS — G4730 Sleep apnea, unspecified: Secondary | ICD-10-CM | POA: Diagnosis not present

## 2021-06-17 DIAGNOSIS — I340 Nonrheumatic mitral (valve) insufficiency: Secondary | ICD-10-CM | POA: Diagnosis not present

## 2021-06-17 DIAGNOSIS — E785 Hyperlipidemia, unspecified: Secondary | ICD-10-CM | POA: Diagnosis not present

## 2021-06-17 NOTE — Patient Instructions (Addendum)
Medication Instructions:  Your physician recommends that you continue on your current medications as directed. Please refer to the Current Medication list given to you today.  *If you need a refill on your cardiac medications before your next appointment, please call your pharmacy*  Lab Work: NONE ordered at this time of appointment   If you have labs (blood work) drawn today and your tests are completely normal, you will receive your results only by: [place] (if you have MyChart) OR A paper copy in the mail If you have any lab test that is abnormal or we need to change your treatment, we will call you to review the results.   Testing/Procedures: Your physician has requested that you have an echocardiogram. Echocardiography is a painless test that uses sound waves to create images of your heart. It provides your doctor with information about the size and shape of your heart and how well your hearts chambers and valves are working. This procedure takes approximately one hour. There are no restrictions for this procedure.  Please schedule for 6 months   Follow-Up: At [facility], you and your health needs are our priority.  As part of our continuing mission to provide you with exceptional heart care, we have created designated Provider Care Teams.  These Care Teams include your primary Cardiologist (physician) and Advanced Practice Providers (APPs -  Physician Assistants and Nurse Practitioners) who all work together to provide you with the care you need, when you need it.  We recommend signing up for the patient portal called "MyChart".  Sign up information is provided on this After Visit Summary.  MyChart is used to connect with patients for Virtual Visits (Telemedicine).  Patients are able to view lab/test results, encounter notes, upcoming appointments, etc.  Non-urgent messages can be sent to your provider as well.   To learn more about what you can do with MyChart, go to  [email or web address].    Your next appointment:   6 month(s) after Echo  The format for your next appointment:   In Person  Provider:   Minus Breeding, MD    Other Instructions

## 2021-06-18 ENCOUNTER — Other Ambulatory Visit (HOSPITAL_COMMUNITY)
Admission: RE | Admit: 2021-06-18 | Discharge: 2021-06-18 | Disposition: A | Discharge status: Home / Self Care | Payer: Medicare Other | Source: Ambulatory Visit | Attending: Obstetrics & Gynecology | Admitting: Obstetrics & Gynecology

## 2021-06-18 ENCOUNTER — Ambulatory Visit (INDEPENDENT_AMBULATORY_CARE_PROVIDER_SITE_OTHER): Payer: Medicare Other | Admitting: Obstetrics & Gynecology

## 2021-06-18 ENCOUNTER — Other Ambulatory Visit (HOSPITAL_BASED_OUTPATIENT_CLINIC_OR_DEPARTMENT_OTHER): Payer: Self-pay | Admitting: Obstetrics & Gynecology

## 2021-06-18 ENCOUNTER — Encounter (HOSPITAL_BASED_OUTPATIENT_CLINIC_OR_DEPARTMENT_OTHER): Payer: Self-pay | Admitting: Obstetrics & Gynecology

## 2021-06-18 VITALS — BP 132/68 | HR 97 | Ht 60.0 in | Wt 120.6 lb

## 2021-06-18 DIAGNOSIS — R300 Dysuria: Secondary | ICD-10-CM | POA: Diagnosis not present

## 2021-06-18 DIAGNOSIS — N898 Other specified noninflammatory disorders of vagina: Secondary | ICD-10-CM

## 2021-06-18 DIAGNOSIS — N760 Acute vaginitis: Secondary | ICD-10-CM

## 2021-06-18 DIAGNOSIS — N952 Postmenopausal atrophic vaginitis: Secondary | ICD-10-CM | POA: Diagnosis not present

## 2021-06-18 LAB — POCT URINALYSIS DIPSTICK
Bilirubin, UA: NEGATIVE
Blood, UA: NEGATIVE
Glucose, UA: NEGATIVE
Ketones, UA: NEGATIVE
Leukocytes, UA: NEGATIVE
Nitrite, UA: NEGATIVE
Protein, UA: NEGATIVE
Spec Grav, UA: <=1.005 — AB (ref 1.010–1.025)
Urobilinogen, UA: 0.2 E.U./dL
pH, UA: 6.5 (ref 5.0–8.0)

## 2021-06-18 MED ORDER — ESTRADIOL 0.1 MG/GM VA CREA: TOPICAL_CREAM | VAGINAL | 6 refills | Status: AC

## 2021-06-18 MED ORDER — TERCONAZOLE 0.8 % VA CREA: 1.0000 | TOPICAL_CREAM | Freq: Every day | VAGINAL | 1 refills | Status: DC

## 2021-06-18 NOTE — Progress Notes (Signed)
GYNECOLOGY  VISIT  CC:   vaginal discharge/odor  HPI: 61 y.o. G43P1001 Single White or Caucasian female here for complaint of vaginal odor that started about a month ago.  At least twice, she's also has some sensation of feeling irritated and there was some skin burning with urination.  She is having some vaginal discharge.  Denies vaginal bleeding.  Denies STD.  Reports hx of recurrent yeast vaginitis.  Last pap was 04/2020.  Has MMG 11/32022  Patient Active Problem List   Diagnosis Date Noted   Mitral valve disease    URI (upper respiratory infection) 09/12/2020   Daytime somnolence 06/26/2020   History of heart failure 06/26/2020   Presence of prosthetic heart valve 06/26/2020   Unspecified abnormal finding in specimens from other organs, systems and tissues 06/26/2020   Vitamin D deficiency 06/26/2020   Chronic pain 06/26/2020   Bruising 06/26/2020   Rash 06/26/2020   Yeast vaginitis 06/26/2020   Family history of mitral valve repair 06/13/2020   Sleep disorder 04/26/2020   Globus pharyngeus 04/26/2020   Neck swelling 04/26/2020   Educated about COVID-19 virus infection 05/11/2019   Fever 02/18/2019   Mixed hyperlipidemia 01/28/2018   Statin intolerance 01/28/2018   Familial hypercholesterolemia 09/24/2017   Dyslipidemia 08/25/2017   Mucous retention cyst of maxillary sinus 04/01/2017   Chronic pansinusitis 02/10/2017   Deviated septum 02/10/2017   Nasal turbinate hypertrophy 02/10/2017   Rhinitis, chronic 02/10/2017   Dysuria 05/07/2016   Sinusitis, acute 11/23/2015   Encounter for well adult exam with abnormal findings 11/23/2015   Localized swelling of both lower legs R> L 05/07/2015   SOB (shortness of breath) 03/08/2015   Fatigue 03/08/2015   Hypothyroidism 02/14/2015   Weight gain 01/10/2015   Nasal polyps 11/30/2014   S/P MVR (mitral valve replacement) 11/17/2014   S/P mitral valve replacement 11/08/2014   Chronic diastolic heart failure ([place])    Coronary  artery disease involving native coronary artery of native heart without angina pectoris 07/28/2014   Chronic pain syndrome    Tobacco abuse    Acute on chronic respiratory failure with hypoxia (HCC)    Chronic obstructive pulmonary disease, unspecified ([place]) 07/17/2014   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/10/2014   History of adenomatous polyp of colon 03/10/2011   IBS (irritable bowel syndrome) 01/21/2011   Gastroesophageal reflux disease without esophagitis 01/21/2011   Fibromyalgia 01/21/2011    Past Medical History:  Diagnosis Date   Acute respiratory failure with hypercapnia ([place]) 08/04/1446   Acute systolic heart failure ([place]) 09/2014   Adenomatous colon polyp    Arthritis    Back pain    Chronic diastolic heart failure ([place])    related to MR/MS   Chronic pain syndrome    Chronic sinusitis    CN (constipation) 05/25/2014   Community acquired pneumonia    COPD (chronic obstructive pulmonary disease)-PFT pending  07/17/2014   Diverticulosis    Fatigue    Fibromyalgia    GERD (gastroesophageal reflux disease)    Headaches, cluster    Hiatal hernia    Hx of adenomatous colonic polyps 03/10/2011   Oct 2012, repeat colon Oct 2017    IBS (irritable bowel syndrome) 01/21/2011   Influenza A 06/29/2014   Internal hemorrhoids    Irritable bowel syndrome (IBS)    Mitral valve regurgitation    severe   Mitral valve stenosis, moderate 07/11/2014   Muscle pain    Muscular deconditioning 07/05/2014   Night sweats    Pleural  effusion    Pneumonia    Protein-calorie malnutrition, severe ([place]) 07/06/2014   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/10/2014   Severe mitral regurgitation with moderate mitral stenosis   S/P minimally invasive mitral valve replacement with bioprosthetic valve 11/08/2014   25 mm [facility] Mitral bovine bioprosthetic tissue valve placed via right mini thoracotomy approach   Tobacco abuse     Past Surgical History:  Procedure Laterality Date   CYSTOSTOMY W/  BLADDER DILATION     at age 16   LEFT AND RIGHT HEART CATHETERIZATION WITH CORONARY ANGIOGRAM N/A 07/27/2014   Procedure: LEFT AND RIGHT HEART CATHETERIZATION WITH CORONARY ANGIOGRAM;  Surgeon: Burnell Blanks, MD;  Location: [facility] CATH LAB;  Right left heart catheterization: Mild/minimal coronary artery disease. RV: 50/7/17, PA: 48/29 (mean 38), PCWP 33 mmHg   MITRAL VALVE REPAIR Right 11/08/2014   Procedure: MINIMALLY INVASIVE MITRAL VALVE REPLACEMENT(MVR);  Surgeon: Rexene Alberts, MD;  Location: [place];  Service: Open Heart Surgery;  Laterality: Right;   NEUROPLASTY / TRANSPOSITION MEDIAN NERVE AT CARPAL TUNNEL BILATERAL     TEE WITHOUT CARDIOVERSION N/A 07/10/2014   Procedure: TRANSESOPHAGEAL ECHOCARDIOGRAM (TEE);  Surgeon: Lelon Perla, MD;  Location: [facility] ENDOSCOPY;  Service: Cardiovascular;  55-60%. No regional wall motion modalities. Moderate mitral stenosis with severe regurgitation and moderate to severely dilated left atrium.   TEE WITHOUT CARDIOVERSION N/A 11/08/2014   Procedure: TRANSESOPHAGEAL ECHOCARDIOGRAM (TEE);  Surgeon: Rexene Alberts, MD;  Location: [place];  Service: Open Heart Surgery;  Laterality: N/A;   TEE WITHOUT CARDIOVERSION N/A 06/07/2021   Procedure: TRANSESOPHAGEAL ECHOCARDIOGRAM (TEE);  Surgeon: Sueanne Margarita, MD;  [facility] ENDOSCOPY;  Service: Cardiovascular;  Laterality: N/A;   THORACIC OUTLET SURGERY     TRANSTHORACIC ECHOCARDIOGRAM  07/04/2014   Normal LV Size/Fnx - EF 60-65% no RWMA; MV: thickened leaflets with doming, fixed Posterior leaflet, anterior leaflet w/ large/shaggy & mobile density.  c/w Rheumatic Mitral disease - moderate MS & Mod-Severe MR.   TUBAL LIGATION     VULVA SURGERY      MEDS:   Current Outpatient Medications on File Prior to Visit  Medication Sig Dispense Refill   clobetasol (TEMOVATE) 0.05 % external solution Apply 1 application topically as needed (Scalp).     liothyronine (CYTOMEL) 5 MCG tablet Take 5 mcg by mouth daily.   2    metroNIDAZOLE (METROCREAM) 0.75 % cream Apply 1 application topically daily. Face     SYNTHROID 75 MCG tablet Take 75 mcg by mouth daily before breakfast.      albuterol (VENTOLIN HFA) 108 (90 Base) MCG/ACT inhaler Inhale 2 puffs into the lungs every 6 (six) hours as needed. (Patient not taking: Reported on 06/17/2021) 18 g 5   Antiseborrheic Products, Misc. (PROMISEB) CREA Apply 1 application topically daily. Face (Patient not taking: Reported on 06/18/2021)     [DISCONTINUED] pantoprazole (PROTONIX) 40 MG tablet Take 1 tablet (40 mg total) by mouth daily. 30 tablet 11   No current facility-administered medications on file prior to visit.    ALLERGIES: Estradiol, Metoclopramide, Warfarin and related, Other, Cefaclor, Montelukast sodium, Morphine and related, and Statins  Family History  Problem Relation Age of Onset   Kidney cancer Mother    Colon polyps Mother    Irritable bowel syndrome Mother    Diabetes Sister    Liver disease Sister    Irritable bowel syndrome Daughter    Breast cancer Paternal Aunt    Diabetes Brother    Colon  cancer Neg Hx    Thyroid disease Neg Hx     SH:  single, former smoker  Review of Systems  Genitourinary:        Vaginal discharge   PHYSICAL EXAMINATION:    BP 132/68 (BP Location: Right Arm, Patient Position: Sitting, Cuff Size: Normal)    Pulse 97    Ht 5' (1.524 m) Comment: reported   Wt 120 lb 9.6 oz (54.7 kg)    BMI 23.55 kg/m     General appearance: alert, cooperative and appears stated age Lymph:  no inguinal LAD noted  Pelvic: External genitalia:  no lesions              Urethra:  normal appearing urethra with no masses, tenderness or lesions              Bartholins and Skenes: normal                 Vagina: atrophic vaginal mucosa with whitish discharge, no lesions              Cervix: no lesions              Bimanual Exam:  Uterus:  normal size, contour, position, consistency, mobility, non-tender              Adnexa: no mass,  fullness, tenderness  Chaperone, Octaviano Batty, CMA, was present for exam.  Assessment/Plan: 1. Vaginal discharge - given history, feel should test to r/o BV - Cervicovaginal ancillary only( [place])  2. Recurrent vaginitis - if yeast present and given hx, pt may benefit from ability to self treat - terconazole (TERAZOL 3) 0.8 % vaginal cream; Place 1 applicator vaginally at bedtime. Use for 3 days.  Dispense: 20 g; Refill: 1  3. Dysuria - POCT Urinalysis Dipstick:  negative today.  No abx treatment recommended  4. Vaginal atrophy - d/w pt treating atrophic findings to see if will help symptoms and decrease recurrent of vaginitis - estradiol (ESTRACE) 0.1 MG/GM vaginal cream; 1 gram vaginally twice weekly  Dispense: 42.5 g; Refill: 6

## 2021-06-19 LAB — CERVICOVAGINAL ANCILLARY ONLY
Bacterial Vaginitis (gardnerella): POSITIVE — AB
Candida Glabrata: NEGATIVE
Candida Vaginitis: NEGATIVE
Comment: NEGATIVE
Comment: NEGATIVE
Comment: NEGATIVE

## 2021-06-21 ENCOUNTER — Other Ambulatory Visit (HOSPITAL_BASED_OUTPATIENT_CLINIC_OR_DEPARTMENT_OTHER): Payer: Self-pay | Admitting: *Deleted

## 2021-06-21 ENCOUNTER — Encounter (HOSPITAL_BASED_OUTPATIENT_CLINIC_OR_DEPARTMENT_OTHER): Payer: Self-pay | Admitting: Obstetrics & Gynecology

## 2021-06-21 MED ORDER — METRONIDAZOLE 500 MG PO TABS: 500.0000 mg | ORAL_TABLET | Freq: Two times a day (BID) | ORAL | 0 refills | Status: DC

## 2021-06-21 NOTE — Telephone Encounter (Signed)
PA submitted for Estrace cream. Approval # [id number]. Valid 05/22/21-06/21/22

## 2021-06-25 ENCOUNTER — Other Ambulatory Visit: Payer: Self-pay | Admitting: *Deleted

## 2021-06-25 MED ORDER — METRONIDAZOLE 0.75 % VA GEL: 1.0000 | Freq: Every day | VAGINAL | 0 refills | Status: DC

## 2021-06-25 NOTE — Progress Notes (Signed)
Pt called requesting prescription for metronidazole be changed from pill to gel. Rx sent

## 2021-07-04 ENCOUNTER — Encounter (HOSPITAL_BASED_OUTPATIENT_CLINIC_OR_DEPARTMENT_OTHER): Payer: Self-pay | Admitting: *Deleted

## 2021-07-31 ENCOUNTER — Ambulatory Visit (HOSPITAL_BASED_OUTPATIENT_CLINIC_OR_DEPARTMENT_OTHER): Payer: Medicare Other | Admitting: Obstetrics & Gynecology

## 2021-08-17 ENCOUNTER — Emergency Department (HOSPITAL_BASED_OUTPATIENT_CLINIC_OR_DEPARTMENT_OTHER)
Admission: EM | Admit: 2021-08-17 | Discharge: 2021-08-17 | Disposition: A | Discharge status: Home / Self Care | Payer: Medicare Other | Attending: Emergency Medicine | Admitting: Emergency Medicine

## 2021-08-17 ENCOUNTER — Other Ambulatory Visit: Payer: Self-pay

## 2021-08-17 ENCOUNTER — Encounter (HOSPITAL_BASED_OUTPATIENT_CLINIC_OR_DEPARTMENT_OTHER): Payer: Self-pay | Admitting: Emergency Medicine

## 2021-08-17 DIAGNOSIS — W268XXA Contact with other sharp object(s), not elsewhere classified, initial encounter: Secondary | ICD-10-CM | POA: Diagnosis not present

## 2021-08-17 DIAGNOSIS — S61310A Laceration without foreign body of right index finger with damage to nail, initial encounter: Secondary | ICD-10-CM | POA: Insufficient documentation

## 2021-08-17 DIAGNOSIS — Y92137 Garden or yard on military base as the place of occurrence of the external cause: Secondary | ICD-10-CM | POA: Diagnosis not present

## 2021-08-17 DIAGNOSIS — S61212A Laceration without foreign body of right middle finger without damage to nail, initial encounter: Secondary | ICD-10-CM

## 2021-08-17 DIAGNOSIS — Z23 Encounter for immunization: Secondary | ICD-10-CM | POA: Insufficient documentation

## 2021-08-17 MED ORDER — DOXYCYCLINE HYCLATE 100 MG PO CAPS: 100.0000 mg | ORAL_CAPSULE | Freq: Two times a day (BID) | ORAL | 0 refills | Status: DC

## 2021-08-17 MED ORDER — TETANUS-DIPHTH-ACELL PERTUSSIS 5-2.5-18.5 LF-MCG/0.5 IM SUSY
0.5000 mL | PREFILLED_SYRINGE | Freq: Once | INTRAMUSCULAR | Status: AC
  Administered 2021-08-17: 0.5 mL via INTRAMUSCULAR
  Filled 2021-08-17: qty 0.5

## 2021-08-17 MED ORDER — DOXYCYCLINE HYCLATE 100 MG PO TABS
100.0000 mg | ORAL_TABLET | Freq: Once | ORAL | Status: AC
  Administered 2021-08-17: 100 mg via ORAL
  Filled 2021-08-17: qty 1

## 2021-08-17 NOTE — ED Triage Notes (Addendum)
Pt caught right 3rd finger on a nail on a picket board yesterday and is unsure of date of last tetanus shot. ?

## 2021-08-17 NOTE — Discharge Instructions (Signed)
Please read and follow all provided instructions. ? ?Your diagnoses today include:  ?1. Laceration of right middle finger without foreign body without damage to nail, initial encounter   ? ? ?Tests performed today include: ?Vital signs. See below for your results today.  ? ?Medications prescribed:  ?Doxycycline - antibiotic ? ?You have been prescribed an antibiotic medicine: take the entire course of medicine even if you are feeling better. Stopping early can cause the antibiotic not to work. ? ?Take any prescribed medications only as directed.  ? ?Home care instructions:  ?Follow any educational materials and wound care instructions contained in this packet.  ? ?Keep affected area above the level of your heart when possible to minimize swelling. Wash area gently twice a day with warm soapy water. Do not apply alcohol or hydrogen peroxide. Cover the area if it draining or weeping.  ? ?Return instructions:  ?Return to the Emergency Department if you have: ?Fever ?Worsening pain ?Worsening swelling of the wound ?Pus draining from the wound ?Redness of the skin that moves away from the wound, especially if it streaks away from the affected area  ?Any other emergent concerns ? ?Your vital signs today were: ?BP 133/86 (BP Location: Left Arm)   Pulse 82   Temp 97.8 ?F (36.6 ?C)   Resp 16   SpO2 100%  ?If your blood pressure (BP) was elevated above 135/85 this visit, please have this repeated by your doctor within one month. ?-------------- ? ?

## 2021-08-17 NOTE — ED Notes (Addendum)
Small wound to left 3rd finger from rusty nail. Pt just requesting Tdap booster. Pt has no other complaints. ?

## 2021-08-17 NOTE — ED Provider Notes (Signed)
?[facility] EMERGENCY DEPT ?Provider Note ? ? ?CSN: [id number] ?Arrival date & time: 08/17/21  1759 ? ?  ? ?History ? ?Chief Complaint  ?Patient presents with  ? Finger Injury  ? ? ?Elaine Le is a 61 y.o. female. ? ?Patient presents to the emergency department for evaluation of right index and long finger injury sustained today.  Patient sustained a mild laceration/abrasion to these digits when a nail that was in a piece of wood cut her hand while working in the yard.  She is requesting tetanus shot and antibiotic prophylaxis.  She does not know when her last tetanus shot was.  No increasing redness, pain or swelling.  She is able to flex and extend her fingers without difficulty.  No fevers. ? ? ?  ? ?Home Medications ?Prior to Admission medications   ?Medication Sig Start Date End Date Taking? Authorizing Provider  ?doxycycline (VIBRAMYCIN) 100 MG capsule Take 1 capsule (100 mg total) by mouth 2 (two) times daily. 08/17/21  Yes Carlisle Cater, PA-C  ?albuterol (VENTOLIN HFA) 108 (90 Base) MCG/ACT inhaler Inhale 2 puffs into the lungs every 6 (six) hours as needed. ?Patient not taking: Reported on 06/17/2021 04/01/21   Spero Geralds, MD  ?[place], Misc. (PROMISEB) CREA Apply 1 application topically daily. Face ?Patient not taking: Reported on 06/18/2021    [provider]  ?clobetasol (TEMOVATE) 0.05 % external solution Apply 1 application topically as needed (Scalp).    [provider]  ?estradiol (ESTRACE) 0.1 MG/GM vaginal cream 1 gram vaginally twice weekly 06/18/21   Megan Salon, MD  ?liothyronine (CYTOMEL) 5 MCG tablet Take 5 mcg by mouth daily.  05/13/17   [provider]  ?metroNIDAZOLE (METROCREAM) 0.75 % cream Apply 1 application topically daily. Face 03/31/20   [provider]  ?metroNIDAZOLE (METROGEL) 0.75 % vaginal gel Place 1 Applicatorful vaginally at bedtime. Use for 5 nights. 06/25/21   Megan Salon, MD  ?SYNTHROID 75 MCG  tablet Take 75 mcg by mouth daily before breakfast.  10/29/18   [provider]  ?terconazole (TERAZOL 3) 0.8 % vaginal cream Place 1 applicator vaginally at bedtime. Use for 3 days. 06/18/21   Megan Salon, MD  ?pantoprazole (PROTONIX) 40 MG tablet Take 1 tablet (40 mg total) by mouth daily. 01/21/11 01/21/12  Ladene Artist, MD  ?   ? ?Allergies    ?Estradiol, Metoclopramide, Warfarin and related, Other, Cefaclor, Montelukast sodium, Morphine and related, and Statins   ? ?Review of Systems   ?Review of Systems ? ?Physical Exam ?Updated Vital Signs ?BP 133/86 (BP Location: Left Arm)   Pulse 82   Temp 97.8 ?F (36.6 ?C)   Resp 16   SpO2 100%  ?Physical Exam ?Vitals and nursing note reviewed.  ?Constitutional:   ?   Appearance: She is well-developed.  ?HENT:  ?   Head: Normocephalic and atraumatic.  ?Eyes:  ?   Conjunctiva/sclera: Conjunctivae normal.  ?Pulmonary:  ?   Effort: No respiratory distress.  ?Musculoskeletal:  ?   Cervical back: Normal range of motion and neck supple.  ?   Comments: Right hand: Patient with full ability to flex and extend all digits.  She has a small puncture to her index finger on the volar aspect between the MCP and PIP joints.  No signs of cellulitis.  She has a superficial laceration to the volar aspect of the long finger between the PIP and DIP joints.  Again no signs of infection.  ?  Skin: ?   General: Skin is warm and dry.  ?Neurological:  ?   Mental Status: She is alert.  ? ? ?ED Results / Procedures / Treatments   ?Labs ?(all labs ordered are listed, but only abnormal results are displayed) ?Labs Reviewed - No data to display ? ?EKG ?None ? ?Radiology ?No results found. ? ?Procedures ?Procedures  ? ? ?Medications Ordered in ED ?Medications  ?Tdap (BOOSTRIX) injection 0.5 mL (has no administration in time range)  ?doxycycline (VIBRA-TABS) tablet 100 mg (has no administration in time range)  ? ? ?ED Course/ Medical Decision Making/ A&P ?  ? ?Patient seen and examined.   ? ?Labs/EKG: None ordered ? ?Imaging: Considered x-ray, but really no sign of bony injury and low concern for foreign body. ? ?Medications/Fluids: Tetanus update, doxycycline, due to cephalosporin allergy. ? ?Most recent vital signs reviewed and are as follows: ?BP 133/86 (BP Location: Left Arm)   Pulse 82   Temp 97.8 ?F (36.6 ?C)   Resp 16   SpO2 100%  ? ?Initial impression: Minor abrasion and superficial laceration of right fingers. ? ?Home treatment plan: Counseled on good wound care, doxycycline x5 days for prophylaxis, tetanus booster. ? ?Return instructions discussed with patient: Pt urged to return with worsening pain, worsening swelling, expanding area of redness or streaking up extremity, fever, or any other concerns. Urged to take complete course of antibiotics as prescribed. Pt verbalizes understanding and agrees with plan. ? ?Follow-up instructions discussed with patient: Follow-up with PCP as needed. ? ?                        ?Medical Decision Making ?Risk ?Prescription drug management. ? ? ?Patient with minor wounds from a nail.  Treatment plan as above.  Tetanus booster updated.  No signs of infection at this time.  Do not suspect tendon injury or hand infection. ? ? ? ? ? ? ? ?Final Clinical Impression(s) / ED Diagnoses ?Final diagnoses:  ?Laceration of right middle finger without foreign body without damage to nail, initial encounter  ? ? ?Rx / DC Orders ?ED Discharge Orders   ? ?      Ordered  ?  doxycycline (VIBRAMYCIN) 100 MG capsule  2 times daily       ? 08/17/21 1954  ? ?  ?  ? ?  ? ? ?  ?Carlisle Cater, PA-C ?08/17/21 1959 ? ?  ?Charlesetta Shanks, MD ?08/18/21 [phone] ? ?

## 2021-10-03 ENCOUNTER — Ambulatory Visit (INDEPENDENT_AMBULATORY_CARE_PROVIDER_SITE_OTHER): Payer: Medicare Other | Admitting: Internal Medicine

## 2021-10-03 ENCOUNTER — Encounter: Payer: Self-pay | Admitting: Internal Medicine

## 2021-10-03 VITALS — BP 122/64 | HR 72 | Temp 98.2°F | Ht 60.0 in | Wt 123.4 lb

## 2021-10-03 DIAGNOSIS — I051 Rheumatic mitral insufficiency: Secondary | ICD-10-CM

## 2021-10-03 DIAGNOSIS — J449 Chronic obstructive pulmonary disease, unspecified: Secondary | ICD-10-CM

## 2021-10-03 DIAGNOSIS — R0602 Shortness of breath: Secondary | ICD-10-CM

## 2021-10-03 LAB — PULMONARY FUNCTION TEST
DL/VA % pred: 103 %
DL/VA: 4.46 ml/min/mmHg/L
DLCO cor % pred: 84 %
DLCO cor: 15.14 ml/min/mmHg
DLCO unc % pred: 84 %
DLCO unc: 15.14 ml/min/mmHg
FEF 25-75 Post: 0.83 L/sec
FEF 25-75 Pre: 0.68 L/sec
FEF2575-%Change-Post: 21 %
FEF2575-%Pred-Post: 39 %
FEF2575-%Pred-Pre: 32 %
FEV1-%Change-Post: 6 %
FEV1-%Pred-Post: 55 %
FEV1-%Pred-Pre: 52 %
FEV1-Post: 1.23 L
FEV1-Pre: 1.15 L
FEV1FVC-%Change-Post: 0 %
FEV1FVC-%Pred-Pre: 86 %
FEV6-%Change-Post: 5 %
FEV6-%Pred-Post: 65 %
FEV6-%Pred-Pre: 61 %
FEV6-Post: 1.8 L
FEV6-Pre: 1.71 L
FEV6FVC-%Change-Post: 0 %
FEV6FVC-%Pred-Post: 102 %
FEV6FVC-%Pred-Pre: 103 %
FVC-%Change-Post: 6 %
FVC-%Pred-Post: 63 %
FVC-%Pred-Pre: 59 %
FVC-Post: 1.82 L
FVC-Pre: 1.71 L
Post FEV1/FVC ratio: 67 %
Post FEV6/FVC ratio: 99 %
Pre FEV1/FVC ratio: 67 %
Pre FEV6/FVC Ratio: 100 %
RV % pred: 77 %
RV: 1.42 L
TLC % pred: 75 %
TLC: 3.42 L

## 2021-10-03 MED ORDER — ANORO ELLIPTA 62.5-25 MCG/ACT IN AEPB: 1.0000 | INHALATION_SPRAY | Freq: Every day | RESPIRATORY_TRACT | 3 refills | Status: DC

## 2021-10-03 NOTE — Patient Instructions (Signed)
Full PFT Performed Today  

## 2021-10-03 NOTE — Progress Notes (Signed)
Full PFT Performed Today  

## 2021-10-03 NOTE — Patient Instructions (Signed)
Please schedule follow up scheduled with myself in 4 months.  If my schedule is not open yet, we will contact you with a reminder closer to that time. Please call [phone] if you haven't heard from [place] a month before.   Before your next visit I would like you to have: Full set of PFTs - next available. I will contact you with results.   Start anoro inhaler 1 puff once a day - wait until after PFTs to start this.   You can take your albuterol rescue inhaler as needed. They will give you this in the PFT so you can see how it affects you first.

## 2021-10-03 NOTE — Progress Notes (Signed)
Elaine Le    [id number]    Mar 23, 1961  Primary Care Physician:John, Hunt Oris, MD Date of Appointment: 10/03/2021 Established Patient Visit  Chief complaint:   Chief Complaint  Patient presents with   Follow-up    Pt is here for SOB follow up. Pt states her heart valves are gone again and she is having fluid build up. She wants to discuss with ND. No fluid medication noted from patient. Pt is only on Albuterol as needed for her breathing and she states she has not needed it.      HPI: Elaine Le is a 60 y.o. 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   Interval Updates: Has had worsening dyspnea and followed with cardiology. Had TEE which shows moderate MR. She is having chest tightness and feels like she's filling up with fluid. She has gained weight and feels like her legs are swollen.  She is scared to take her albuterol inhaler because she had an episode while inpatient several years ago. Plan for MR is to repeat Echo in August before making any decisions about treatment or replacement.  She cancelled her PFT because she wasn't sure if symptoms were related to her lungs.   I have reviewed the patient's family social and past medical history and updated as appropriate.   Past Medical History:  Diagnosis Date   Acute respiratory failure with hypercapnia ([place]) 4/40/3474   Acute systolic heart failure (HCC) 09/2014   Adenomatous colon polyp    Arthritis    Back pain    Chronic diastolic heart failure (HCC)    related to MR/MS   Chronic pain syndrome    Chronic sinusitis    CN (constipation) 05/25/2014   Community acquired pneumonia    COPD (chronic obstructive pulmonary disease)-PFT pending  07/17/2014   Diverticulosis    Fatigue    Fibromyalgia    GERD (gastroesophageal reflux disease)    Headaches, cluster    Hiatal hernia    Hx of adenomatous colonic polyps 03/10/2011   Oct  2012, repeat colon Oct 2017    IBS (irritable bowel syndrome) 01/21/2011   Influenza A 06/29/2014   Internal hemorrhoids    Irritable bowel syndrome (IBS)    Mitral valve regurgitation    severe   Mitral valve stenosis, moderate 07/11/2014   Muscle pain    Muscular deconditioning 07/05/2014   Night sweats    Pleural effusion    Pneumonia    Protein-calorie malnutrition, severe ([place]) 07/06/2014   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/10/2014   Severe mitral regurgitation with moderate mitral stenosis   S/P minimally invasive mitral valve replacement with bioprosthetic valve 11/08/2014   25 mm [facility] Mitral bovine bioprosthetic tissue valve placed via right mini thoracotomy approach   Tobacco abuse     Past Surgical History:  Procedure Laterality Date   CYSTOSTOMY W/ BLADDER DILATION     at age 48   LEFT AND RIGHT HEART CATHETERIZATION WITH CORONARY ANGIOGRAM N/A 07/27/2014   Procedure: LEFT AND RIGHT HEART CATHETERIZATION WITH CORONARY ANGIOGRAM;  Surgeon: Burnell Blanks, MD;  Location: [facility] CATH LAB;  Right left heart catheterization: Mild/minimal coronary artery disease. RV: 50/7/17, PA: 48/29 (mean 38), PCWP 33 mmHg   MITRAL VALVE REPAIR Right 11/08/2014   Procedure: MINIMALLY INVASIVE MITRAL VALVE REPLACEMENT(MVR);  Surgeon: Rexene Alberts, MD;  Location: [place];  Service: Open Heart Surgery;  Laterality: Right;   NEUROPLASTY / TRANSPOSITION MEDIAN NERVE AT CARPAL TUNNEL BILATERAL     TEE WITHOUT CARDIOVERSION N/A 07/10/2014   Procedure: TRANSESOPHAGEAL ECHOCARDIOGRAM (TEE);  Surgeon: Lelon Perla, MD;  Location[facility] ENDOSCOPY;  Service: Cardiovascular;  55-60%. No regional wall motion modalities. Moderate mitral stenosis with severe regurgitation and moderate to severely dilated left atrium.   TEE WITHOUT CARDIOVERSION N/A 11/08/2014   Procedure: TRANSESOPHAGEAL ECHOCARDIOGRAM (TEE);  Surgeon: Rexene Alberts, MD;  Location: [place];  Service: Open Heart Surgery;  Laterality:  N/A;   TEE WITHOUT CARDIOVERSION N/A 06/07/2021   Procedure: TRANSESOPHAGEAL ECHOCARDIOGRAM (TEE);  Surgeon: Sueanne Margarita, MD;  Location: [facility] ENDOSCOPY;  Service: Cardiovascular;  Laterality: N/A;   THORACIC OUTLET SURGERY     TRANSTHORACIC ECHOCARDIOGRAM  07/04/2014   Normal LV Size/Fnx - EF 60-65% no RWMA; MV: thickened leaflets with doming, fixed Posterior leaflet, anterior leaflet w/ large/shaggy & mobile density.  c/w Rheumatic Mitral disease - moderate MS & Mod-Severe MR.   TUBAL LIGATION     VULVA SURGERY      Family History  Problem Relation Age of Onset   Kidney cancer Mother    Colon polyps Mother    Irritable bowel syndrome Mother    Diabetes Sister    Liver disease Sister    Irritable bowel syndrome Daughter    Breast cancer Paternal Aunt    Diabetes Brother    Colon cancer Neg Hx    Thyroid disease Neg Hx     Social History   Occupational History   Occupation: disabled  Tobacco Use   Smoking status: Former    Packs/day: 0.50    Years: 35.00    Total pack years: 17.50    Types: Cigarettes    Quit date: 06/17/2014    Years since quitting: 7.3   Smokeless tobacco: Never  Substance and Sexual Activity   Alcohol use: No    Alcohol/week: 0.0 standard drinks of alcohol    Comment: rare   Drug use: No   Sexual activity: Never     Physical Exam: Blood pressure 122/64, pulse 72, temperature 98.2 F (36.8 C), temperature source Oral, height 5' (1.524 m), weight 123 lb 6.4 oz (56 kg), SpO2 96 %.  Gen:      No acute distress ENT:  no nasal polyps, mucus membranes moist Lungs:    No increased respiratory effort, symmetric chest wall excursion, clear to auscultation bilaterally, no wheezes or crackles CV:         Regular rate and rhythm; systolic murmur auscultated, no LE edema, no JVD   Data Reviewed: Imaging: I have personally reviewed the echocardiogram which shows moderate MR  PFTs:     Latest Ref Rng & Units 07/28/2014   11:07 AM  PFT Results   FVC-Pre L 2.16   FVC-Predicted Pre % 71   FVC-Post L 2.31   FVC-Predicted Post % 76   Pre FEV1/FVC % % 71   Post FEV1/FCV % % 73   FEV1-Pre L 1.54   FEV1-Predicted Pre % 64   FEV1-Post L 1.68   DLCO uncorrected ml/min/mmHg 14.87   DLCO UNC% % 76   DLVA Predicted % 90   TLC L 4.26   TLC % Predicted % 94   RV % Predicted % 111    I have personally reviewed the patient's PFTs  - spiro 2016 shows moderate airflow limitation without BD response.   Labs:  Immunization status: Immunization History  Administered Date(s) Administered  Moderna Sars-Covid-2 Vaccination 06/27/2019, 07/25/2019   Pneumococcal Conjugate-13 08/23/2014   Pneumococcal Polysaccharide-23 12/07/2013, 02/26/2014   Tdap 12/07/2013, 08/17/2021   Zoster, Live 12/07/2013    External Records Personally Reviewed: cardiology, ED  Assessment:  COPD Moderate MR, rheumatic valve disease s/p repair OSA on CPAP controlled   Plan/Recommendations: She is concerned if her breathing issues are related to valve or to copd. Will obtain PFTs so she can make an informed decision about next steps for her valve including repair. After PFTs she can start anoro inhaler 1 puff once a day to see if this improves her dyspnea. I do not think she is in pulmonary edema or decompensated heart failure today. I will communicate the results with her and see her again in a few months.    Return to Care: Return in about 4 months (around 02/02/2022).   Lenice Llamas, MD Pulmonary and [place]

## 2021-10-16 ENCOUNTER — Telehealth: Payer: Self-pay | Admitting: Internal Medicine

## 2021-10-16 NOTE — Telephone Encounter (Signed)
Lm x1 for patient.  

## 2021-10-17 NOTE — Telephone Encounter (Signed)
Elaine Geralds, MD  10/08/2021  4:10 PM EDT     Please call - let her know that breathing testing is consistent with COPD that has worsened since last checked 7 years ago. Agree with start anoro first and seeing how that does with her symptoms. She should follow up later in the year with cardiology about her valve, and make the decision after she has been on anoro for at least a couple months. - Dr Shearon Stalls    Patient is aware of results and voiced her understanding.  She stated that her breathing has improved. She has not taken Anoro, and she does not plan to start since her breathing has improved.  Nothing further needed.   Routing to Dr. Shearon Stalls as an [place] [place].

## 2021-10-18 ENCOUNTER — Ambulatory Visit (INDEPENDENT_AMBULATORY_CARE_PROVIDER_SITE_OTHER): Payer: Medicare Other | Admitting: Internal Medicine

## 2021-10-18 ENCOUNTER — Encounter: Payer: Self-pay | Admitting: Internal Medicine

## 2021-10-18 VITALS — BP 124/70 | HR 84 | Temp 97.9°F | Ht 60.0 in | Wt 118.0 lb

## 2021-10-18 DIAGNOSIS — J449 Chronic obstructive pulmonary disease, unspecified: Secondary | ICD-10-CM

## 2021-10-31 ENCOUNTER — Other Ambulatory Visit: Payer: Self-pay | Admitting: Internal Medicine

## 2021-10-31 ENCOUNTER — Ambulatory Visit
Admission: RE | Admit: 2021-10-31 | Discharge: 2021-10-31 | Disposition: A | Discharge status: Home / Self Care | Payer: Medicare Other | Source: Ambulatory Visit | Attending: Internal Medicine | Admitting: Internal Medicine

## 2021-10-31 DIAGNOSIS — J449 Chronic obstructive pulmonary disease, unspecified: Secondary | ICD-10-CM

## 2021-12-12 ENCOUNTER — Other Ambulatory Visit: Payer: Self-pay | Admitting: Obstetrics & Gynecology

## 2021-12-12 DIAGNOSIS — Z1231 Encounter for screening mammogram for malignant neoplasm of breast: Secondary | ICD-10-CM

## 2021-12-16 ENCOUNTER — Ambulatory Visit (HOSPITAL_COMMUNITY): Payer: Medicare Other | Attending: Cardiovascular Disease

## 2021-12-16 DIAGNOSIS — I340 Nonrheumatic mitral (valve) insufficiency: Secondary | ICD-10-CM | POA: Diagnosis present

## 2021-12-16 LAB — ECHOCARDIOGRAM COMPLETE
Area-P 1/2: 1.8 cm2
MV M vel: 4.48 m/s
MV Peak grad: 80.3 mmHg
MV VTI: 1.14 cm2
S' Lateral: 2.4 cm

## 2021-12-22 ENCOUNTER — Encounter: Payer: Self-pay | Admitting: Cardiology

## 2021-12-22 NOTE — H&P (View-Only) (Signed)
Cardiology Office Note   Date:  12/23/2021   ID:  Elaine Le, DOB 02-08-1961, MRN [id number]  PCP:  Elaine Borg, MD  Cardiologist:   Minus Breeding, MD   Chief Complaint  Patient presents with   Shortness of Breath      History of Present Illness: Elaine Le is a 61 y.o. female who presents for follow-up after mitral valve replacement. She had probable rheumatic disease with severe regurgitation and moderate stenosis and is status post minimally invasive valve replacement.   TEE in Feb 2023 suggested moderate MR with moderate stenosis.  She had an TTE last week.   The mitral valve has severe regurgitation with a 25 mm [employer] Magna bioprosthetic valve.   The EF is 65%.    She unfortunately has had increasing shortness of breath.  She went back to see her pulmonologist for management of COPD.  Is not clear that this is the cause of her increasing dyspnea however.  She did have a chest x-ray with demonstrated some mild septal thickening but no effusions or overt edema.  She has a dry nonproductive cough.  She gets hot and cold but she is not having fevers.  She is not describing PND or orthopnea.  She has her CPAP now which she says she is wearing at night.  However, she is progressively more short of breath with mild activities.  She says she is walking in between houses which is about 50 yards or up a flight of stairs she might have to stop for 1 to several minutes to catch her breath.  She is not describing chest pressure, neck or arm discomfort.  She does feel her heart racing when she is short of breath.  Her oxygen saturations typically seem okay.  However, she has clearly had a decrease in exercise tolerance and increased symptoms.  Of note her symptoms have persisted despite the fact that she has not been smoking cigarettes now at all for several years and she has even lost a little weight.  She tries to be physically active.   Past Medical History:  Diagnosis  Date   Acute respiratory failure with hypercapnia ([place]) 87/56/4332   Acute systolic heart failure ([place]) 09/2014   Adenomatous colon polyp    Arthritis    Back pain    Chronic diastolic heart failure (HCC)    related to MR/MS   Chronic pain syndrome    Chronic sinusitis    COPD (chronic obstructive pulmonary disease)-PFT pending  07/17/2014   Diverticulosis    Fibromyalgia    GERD (gastroesophageal reflux disease)    Headaches, cluster    Hiatal hernia    Hx of adenomatous colonic polyps 03/10/2011   Oct 2012, repeat colon Oct 2017    Internal hemorrhoids    Irritable bowel syndrome (IBS)    Mitral valve regurgitation    severe   Mitral valve stenosis, moderate 07/11/2014   Protein-calorie malnutrition, severe ([place]) 07/06/2014   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/10/2014   Severe mitral regurgitation with moderate mitral stenosis   S/P minimally invasive mitral valve replacement with bioprosthetic valve 11/08/2014   25 mm [facility] Mitral bovine bioprosthetic tissue valve placed via right mini thoracotomy approach   Tobacco abuse     Past Surgical History:  Procedure Laterality Date   CYSTOSTOMY W/ BLADDER DILATION     at age 32   LEFT AND RIGHT HEART CATHETERIZATION WITH CORONARY ANGIOGRAM N/A 07/27/2014  Procedure: LEFT AND RIGHT HEART CATHETERIZATION WITH CORONARY ANGIOGRAM;  Surgeon: Burnell Blanks, MD;  Location: [facility] CATH LAB;  Right left heart catheterization: Mild/minimal coronary artery disease. RV: 50/7/17, PA: 48/29 (mean 38), PCWP 33 mmHg   MITRAL VALVE REPAIR Right 11/08/2014   Procedure: MINIMALLY INVASIVE MITRAL VALVE REPLACEMENT(MVR);  Surgeon: Rexene Alberts, MD;  Location: [place];  Service: Open Heart Surgery;  Laterality: Right;   NEUROPLASTY / TRANSPOSITION MEDIAN NERVE AT CARPAL TUNNEL BILATERAL     TEE WITHOUT CARDIOVERSION N/A 07/10/2014   Procedure: TRANSESOPHAGEAL ECHOCARDIOGRAM (TEE);  Surgeon: Lelon Perla, MD;  Location: [facility]  ENDOSCOPY;  Service: Cardiovascular;  55-60%. No regional wall motion modalities. Moderate mitral stenosis with severe regurgitation and moderate to severely dilated left atrium.   TEE WITHOUT CARDIOVERSION N/A 11/08/2014   Procedure: TRANSESOPHAGEAL ECHOCARDIOGRAM (TEE);  Surgeon: Rexene Alberts, MD;  Location: [place];  Service: Open Heart Surgery;  Laterality: N/A;   TEE WITHOUT CARDIOVERSION N/A 06/07/2021   Procedure: TRANSESOPHAGEAL ECHOCARDIOGRAM (TEE);  Surgeon: Sueanne Margarita, MD;  Location: [facility] ENDOSCOPY;  Service: Cardiovascular;  Laterality: N/A;   THORACIC OUTLET SURGERY     TRANSTHORACIC ECHOCARDIOGRAM  07/04/2014   Normal LV Size/Fnx - EF 60-65% no RWMA; MV: thickened leaflets with doming, fixed Posterior leaflet, anterior leaflet w/ large/shaggy & mobile density.  c/w Rheumatic Mitral disease - moderate MS & Mod-Severe MR.   TUBAL LIGATION     VULVA SURGERY       Current Outpatient Medications  Medication Sig Dispense Refill   albuterol (VENTOLIN HFA) 108 (90 Base) MCG/ACT inhaler Inhale 2 puffs into the lungs every 6 (six) hours as needed. 18 g 5   clobetasol (TEMOVATE) 0.05 % external solution Apply 1 application topically as needed (Scalp).     estradiol (ESTRACE) 0.1 MG/GM vaginal cream 1 gram vaginally twice weekly 42.5 g 6   furosemide (LASIX) 20 MG tablet Take 1 tablet (20 mg total) by mouth daily. 90 tablet 3   liothyronine (CYTOMEL) 5 MCG tablet Take 5 mcg by mouth daily.   2   metroNIDAZOLE (METROCREAM) 0.75 % cream Apply 1 application topically daily. Face     SYNTHROID 75 MCG tablet Take 75 mcg by mouth daily before breakfast.      terconazole (TERAZOL 3) 0.8 % vaginal cream Place 1 applicator vaginally at bedtime. Use for 3 days. 20 g 1   No current facility-administered medications for this visit.    Allergies:   Estradiol, Metoclopramide, Warfarin and related, Other, Montelukast sodium, Morphine and related, and Statins    ROS:  Please see the history of  present illness.   Otherwise, review of systems are positive for none . All other systems are reviewed and negative.    PHYSICAL EXAM: VS:  BP 115/72   Pulse 71   Ht 5' (1.524 m)   Wt 118 lb 12.8 oz (53.9 kg)   SpO2 99%   BMI 23.20 kg/m  , BMI Body mass index is 23.2 kg/m.  GENERAL:  Well appearing NECK:  No jugular venous distention, waveform within normal limits, carotid upstroke brisk and symmetric, no bruits, no thyromegaly LUNGS:  Clear to auscultation bilaterally CHEST:  Well healed sternotomy scar. HEART:  PMI not displaced or sustained,S1 and S2 within normal limits, no S3, no S4, no clicks, no rubs, 3 out of 6 apical systolic murmur heard anteriorly but also radiating to the axilla, no diastolic murmurs ABD:  Flat, positive bowel sounds normal in frequency in  pitch, no bruits, no rebound, no guarding, no midline pulsatile mass, no hepatomegaly, no splenomegaly EXT:  2 plus pulses throughout, no edema, no cyanosis no clubbing  EKG:  EKG is  ordered today. Sinus rhythm, rate 93, axis within normal limits, intervals within normal limits, poor anterior R wave progression, possible old anteroseptal infarct.  Recent Labs: 11/09/2014: Magnesium 2.1 01/10/2015: ALT 27; BUN 9; Creatinine, Ser 0.70; Potassium 4.3; Sodium 139 02/01/2015: Hemoglobin 15.1*; Platelets 292.0 03/29/2015: TSH 4.03    Lipid Panel    Component Value Date/Time   CHOL 256 (H) 06/26/2020 1124   TRIG 50.0 06/26/2020 1124   HDL 73.50 06/26/2020 1124   CHOLHDL 3 06/26/2020 1124   VLDL 10.0 06/26/2020 1124   LDLCALC 172 (H) 06/26/2020 1124      Wt Readings from Last 3 Encounters:  12/23/21 118 lb 12.8 oz (53.9 kg)  10/18/21 118 lb (53.5 kg)  10/03/21 123 lb 6.4 oz (56 kg)      Other studies Reviewed: Additional studies/ records that were reviewed today include: Pulmonology records and pulmonary function testing    ASSESSMENT AND PLAN:  MV REPLACEMENT:   TTE suggested severe MR. I am afraid that her  increasing symptoms are related to this.   Neck step is right and left heart catheterization.  She might well need valve replacement.  The patient understands that risks included but are not limited to stroke (1 in 1000), death (1 in 21), kidney failure [usually temporary] (1 in 500), bleeding (1 in 200), allergic reaction [possibly serious] (1 in 200).  The patient understands and agrees to proceed.   Along with other labs today we will check a BNP level.  I am going to give her a low-dose diuretic Lasix 20 mg to see if there is any improvement.  She is going to take an ASA.   SOB: As above.  She will also continue to follow with pulmonary.  I will exclude obstructive coronary disease with cath above.  DYSLIPIDEMIA:    She has been intolerant of statins.  She did not want to take PCSK9 inhibitor.   SLEEP APNEA: She is using her CPAP.   Current medicines are reviewed at length with the patient today.  The patient does not have concerns regarding medicines.  The following changes have been made: None  Labs/ tests ordered today include:    Disposition:   FU with me in six months.    Signed, Minus Breeding, MD  12/23/2021 11:18 AM    [place]

## 2021-12-22 NOTE — Progress Notes (Unsigned)
Cardiology Office Note   Date:  12/23/2021   ID:  Elaine Le, DOB August 16, 1960, MRN [id number]  PCP:  Biagio Borg, MD  Cardiologist:   Minus Breeding, MD   Chief Complaint  Patient presents with   Shortness of Breath      History of Present Illness: Elaine Le is a 61 y.o. female who presents for follow-up after mitral valve replacement. She had probable rheumatic disease with severe regurgitation and moderate stenosis and is status post minimally invasive valve replacement.   TEE in Feb 2023 suggested moderate MR with moderate stenosis.  She had an TTE last week.   The mitral valve has severe regurgitation with a 25 mm Edwards Magna bioprosthetic valve.   The EF is 65%.    She unfortunately has had increasing shortness of breath.  She went back to see her pulmonologist for management of COPD.  Is not clear that this is the cause of her increasing dyspnea however.  She did have a chest x-ray with demonstrated some mild septal thickening but no effusions or overt edema.  She has a dry nonproductive cough.  She gets hot and cold but she is not having fevers.  She is not describing PND or orthopnea.  She has her CPAP now which she says she is wearing at night.  However, she is progressively more short of breath with mild activities.  She says she is walking in between houses which is about 50 yards or up a flight of stairs she might have to stop for 1 to several minutes to catch her breath.  She is not describing chest pressure, neck or arm discomfort.  She does feel her heart racing when she is short of breath.  Her oxygen saturations typically seem okay.  However, she has clearly had a decrease in exercise tolerance and increased symptoms.  Of note her symptoms have persisted despite the fact that she has not been smoking cigarettes now at all for several years and she has even lost a little weight.  She tries to be physically active.   Past Medical History:  Diagnosis  Date   Acute respiratory failure with hypercapnia ([place]) 71/69/6789   Acute systolic heart failure ([place]) 09/2014   Adenomatous colon polyp    Arthritis    Back pain    Chronic diastolic heart failure ([facility])    related to MR/MS   Chronic pain syndrome    Chronic sinusitis    COPD (chronic obstructive pulmonary disease)-PFT pending  07/17/2014   Diverticulosis    Fibromyalgia    GERD (gastroesophageal reflux disease)    Headaches, cluster    Hiatal hernia    Hx of adenomatous colonic polyps 03/10/2011   Oct 2012, repeat colon Oct 2017    Internal hemorrhoids    Irritable bowel syndrome (IBS)    Mitral valve regurgitation    severe   Mitral valve stenosis, moderate 07/11/2014   Protein-calorie malnutrition, severe ([place]) 07/06/2014   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/10/2014   Severe mitral regurgitation with moderate mitral stenosis   S/P minimally invasive mitral valve replacement with bioprosthetic valve 11/08/2014   25 mm [facility] Mitral bovine bioprosthetic tissue valve placed via right mini thoracotomy approach   Tobacco abuse     Past Surgical History:  Procedure Laterality Date   CYSTOSTOMY W/ BLADDER DILATION     at age 18   LEFT AND RIGHT HEART CATHETERIZATION WITH CORONARY ANGIOGRAM N/A 07/27/2014  Procedure: LEFT AND RIGHT HEART CATHETERIZATION WITH CORONARY ANGIOGRAM;  Surgeon: Burnell Blanks, MD;  Location: [facility] CATH LAB;  Right left heart catheterization: Mild/minimal coronary artery disease. RV: 50/7/17, PA: 48/29 (mean 38), PCWP 33 mmHg   MITRAL VALVE REPAIR Right 11/08/2014   Procedure: MINIMALLY INVASIVE MITRAL VALVE REPLACEMENT(MVR);  Surgeon: Rexene Alberts, MD;  Location: [place];  Service: Open Heart Surgery;  Laterality: Right;   NEUROPLASTY / TRANSPOSITION MEDIAN NERVE AT CARPAL TUNNEL BILATERAL     TEE WITHOUT CARDIOVERSION N/A 07/10/2014   Procedure: TRANSESOPHAGEAL ECHOCARDIOGRAM (TEE);  Surgeon: Lelon Perla, MD;  Location[facility]  ENDOSCOPY;  Service: Cardiovascular;  55-60%. No regional wall motion modalities. Moderate mitral stenosis with severe regurgitation and moderate to severely dilated left atrium.   TEE WITHOUT CARDIOVERSION N/A 11/08/2014   Procedure: TRANSESOPHAGEAL ECHOCARDIOGRAM (TEE);  Surgeon: Rexene Alberts, MD;  Location: [place];  Service: Open Heart Surgery;  Laterality: N/A;   TEE WITHOUT CARDIOVERSION N/A 06/07/2021   Procedure: TRANSESOPHAGEAL ECHOCARDIOGRAM (TEE);  Surgeon: Sueanne Margarita, MD;  Location[facility] ENDOSCOPY;  Service: Cardiovascular;  Laterality: N/A;   THORACIC OUTLET SURGERY     TRANSTHORACIC ECHOCARDIOGRAM  07/04/2014   Normal LV Size/Fnx - EF 60-65% no RWMA; MV: thickened leaflets with doming, fixed Posterior leaflet, anterior leaflet w/ large/shaggy & mobile density.  c/w Rheumatic Mitral disease - moderate MS & Mod-Severe MR.   TUBAL LIGATION     VULVA SURGERY       Current Outpatient Medications  Medication Sig Dispense Refill   albuterol (VENTOLIN HFA) 108 (90 Base) MCG/ACT inhaler Inhale 2 puffs into the lungs every 6 (six) hours as needed. 18 g 5   clobetasol (TEMOVATE) 0.05 % external solution Apply 1 application topically as needed (Scalp).     estradiol (ESTRACE) 0.1 MG/GM vaginal cream 1 gram vaginally twice weekly 42.5 g 6   furosemide (LASIX) 20 MG tablet Take 1 tablet (20 mg total) by mouth daily. 90 tablet 3   liothyronine (CYTOMEL) 5 MCG tablet Take 5 mcg by mouth daily.   2   metroNIDAZOLE (METROCREAM) 0.75 % cream Apply 1 application topically daily. Face     SYNTHROID 75 MCG tablet Take 75 mcg by mouth daily before breakfast.      terconazole (TERAZOL 3) 0.8 % vaginal cream Place 1 applicator vaginally at bedtime. Use for 3 days. 20 g 1   No current facility-administered medications for this visit.    Allergies:   Estradiol, Metoclopramide, Warfarin and related, Other, Montelukast sodium, Morphine and related, and Statins    ROS:  Please see the history of  present illness.   Otherwise, review of systems are positive for none . All other systems are reviewed and negative.    PHYSICAL EXAM: VS:  BP 115/72   Pulse 71   Ht 5' (1.524 m)   Wt 118 lb 12.8 oz (53.9 kg)   SpO2 99%   BMI 23.20 kg/m  , BMI Body mass index is 23.2 kg/m.  GENERAL:  Well appearing NECK:  No jugular venous distention, waveform within normal limits, carotid upstroke brisk and symmetric, no bruits, no thyromegaly LUNGS:  Clear to auscultation bilaterally CHEST:  Well healed sternotomy scar. HEART:  PMI not displaced or sustained,S1 and S2 within normal limits, no S3, no S4, no clicks, no rubs, 3 out of 6 apical systolic murmur heard anteriorly but also radiating to the axilla, no diastolic murmurs ABD:  Flat, positive bowel sounds normal in frequency in  pitch, no bruits, no rebound, no guarding, no midline pulsatile mass, no hepatomegaly, no splenomegaly EXT:  2 plus pulses throughout, no edema, no cyanosis no clubbing  EKG:  EKG is  ordered today. Sinus rhythm, rate 93, axis within normal limits, intervals within normal limits, poor anterior R wave progression, possible old anteroseptal infarct.  Recent Labs: 11/09/2014: Magnesium 2.1 01/10/2015: ALT 27; BUN 9; Creatinine, Ser 0.70; Potassium 4.3; Sodium 139 02/01/2015: Hemoglobin 15.1*; Platelets 292.0 03/29/2015: TSH 4.03    Lipid Panel    Component Value Date/Time   CHOL 256 (H) 06/26/2020 1124   TRIG 50.0 06/26/2020 1124   HDL 73.50 06/26/2020 1124   CHOLHDL 3 06/26/2020 1124   VLDL 10.0 06/26/2020 1124   LDLCALC 172 (H) 06/26/2020 1124      Wt Readings from Last 3 Encounters:  12/23/21 118 lb 12.8 oz (53.9 kg)  10/18/21 118 lb (53.5 kg)  10/03/21 123 lb 6.4 oz (56 kg)      Other studies Reviewed: Additional studies/ records that were reviewed today include: Pulmonology records and pulmonary function testing    ASSESSMENT AND PLAN:  MV REPLACEMENT:   TTE suggested severe MR. I am afraid that her  increasing symptoms are related to this.   Neck step is right and left heart catheterization.  She might well need valve replacement.  The patient understands that risks included but are not limited to stroke (1 in 1000), death (1 in 75), kidney failure [usually temporary] (1 in 500), bleeding (1 in 200), allergic reaction [possibly serious] (1 in 200).  The patient understands and agrees to proceed.   Along with other labs today we will check a BNP level.  I am going to give her a low-dose diuretic Lasix 20 mg to see if there is any improvement.  She is going to take an ASA.   SOB: As above.  She will also continue to follow with pulmonary.  I will exclude obstructive coronary disease with cath above.  DYSLIPIDEMIA:    She has been intolerant of statins.  She did not want to take PCSK9 inhibitor.   SLEEP APNEA: She is using her CPAP.   Current medicines are reviewed at length with the patient today.  The patient does not have concerns regarding medicines.  The following changes have been made: None  Labs/ tests ordered today include:    Disposition:   FU with me in six months.    Signed, Minus Breeding, MD  12/23/2021 11:18 AM    [place]

## 2021-12-23 ENCOUNTER — Telehealth: Payer: Self-pay

## 2021-12-23 ENCOUNTER — Encounter: Payer: Self-pay | Admitting: Cardiology

## 2021-12-23 ENCOUNTER — Ambulatory Visit: Payer: Medicare Other | Attending: Cardiology | Admitting: Cardiology

## 2021-12-23 VITALS — BP 115/72 | HR 71 | Ht 60.0 in | Wt 118.8 lb

## 2021-12-23 DIAGNOSIS — R0602 Shortness of breath: Secondary | ICD-10-CM | POA: Diagnosis present

## 2021-12-23 DIAGNOSIS — I340 Nonrheumatic mitral (valve) insufficiency: Secondary | ICD-10-CM | POA: Insufficient documentation

## 2021-12-23 DIAGNOSIS — Z952 Presence of prosthetic heart valve: Secondary | ICD-10-CM | POA: Diagnosis not present

## 2021-12-23 DIAGNOSIS — Z01812 Encounter for preprocedural laboratory examination: Secondary | ICD-10-CM | POA: Insufficient documentation

## 2021-12-23 LAB — CBC

## 2021-12-23 MED ORDER — FUROSEMIDE 20 MG PO TABS: 20.0000 mg | ORAL_TABLET | Freq: Every day | ORAL | 3 refills | Status: DC

## 2021-12-23 MED ORDER — ASPIRIN 81 MG PO TBEC: 81.0000 mg | DELAYED_RELEASE_TABLET | Freq: Every day | ORAL | 12 refills | Status: DC

## 2021-12-23 NOTE — Telephone Encounter (Signed)
Pt made aware and verbalized understanding.

## 2021-12-23 NOTE — Telephone Encounter (Signed)
-  Per Dr. Percival Spanish, pt need to start Asprin 81 mg daily -Attempted to contact pt to make aware -Left message to call back

## 2021-12-23 NOTE — Patient Instructions (Signed)
Medication Instructions:  START: Lasix 20 mg daily  *If you need a refill on your cardiac medications before your next appointment, please call your pharmacy*   Lab Work: Your physician recommends lab work today (BNP, BMP, CBC)   If you have labs (blood work) drawn today and your tests are completely normal, you will receive your results only by: [place] (if you have MyChart) OR A paper copy in the mail If you have any lab test that is abnormal or we need to change your treatment, we will call you to review the results.   Testing/Procedures: Your physician has requested that you have a cardiac catheterization. Cardiac catheterization is used to diagnose and/or treat various heart conditions. Doctors may recommend this procedure for a number of different reasons. The most common reason is to evaluate chest pain. Chest pain can be a symptom of coronary artery disease (CAD), and cardiac catheterization can show whether plaque is narrowing or blocking your heart's arteries. This procedure is also used to evaluate the valves, as well as measure the blood flow and oxygen levels in different parts of your heart. For further information please visit [email or web address].[email or web address]. Please follow instruction sheet, as given. [facility]   Follow-Up: At [facility], you and your health needs are our priority.  As part of our continuing mission to provide you with exceptional heart care, we have created designated Provider Care Teams.  These Care Teams include your primary Cardiologist (physician) and Advanced Practice Providers (APPs -  Physician Assistants and Nurse Practitioners) who all work together to provide you with the care you need, when you need it.  We recommend signing up for the patient portal called "MyChart".  Sign up information is provided on this After Visit Summary.  MyChart is used to connect with patients for Virtual Visits (Telemedicine).  Patients are able to view  lab/test results, encounter notes, upcoming appointments, etc.  Non-urgent messages can be sent to your provider as well.   To learn more about what you can do with MyChart, go to [email or web address].    Your next appointment:   2 week(s)  The format for your next appointment:   In Person  Provider:   Minus Breeding, MD  or APP     [place] A DEPT OF [place] A DEPT OF [place]. [facility] [place] [id number] [place] [address] Dept: [phone] Loc: [phone]  DASIAH HOOLEY  12/23/2021  You are scheduled for a Cardiac Catheterization on Thursday, August 31 with Dr. Sherren Mocha.  1. Please arrive at the [facility] (Main Entrance A) at [facility]: [address] [place], [place] [address] at 8:30 AM (This time is two hours before your procedure to ensure your preparation). Free valet parking service is available.   Special note: Every effort is made to have your procedure done on time. Please understand that emergencies sometimes delay scheduled procedures.  2. Diet: Do not eat solid foods after midnight.  The patient may have clear liquids until 5am upon the day of the procedure.  3. Labs: You will need to have blood drawn today (BNP, BMP, CBC)  4. Medication instructions in preparation for your procedure:   Contrast Allergy: No    On the morning of your procedure, take your Aspirin and any morning medicines NOT listed above.  You may use sips of water.  5. Plan for one night  stay--bring personal belongings. 6. Bring a current list of your medications and current insurance cards. 7. You MUST have a responsible person to drive you home. 8. Someone MUST be with you the first 24 hours after you arrive home or your discharge will be delayed. 9. Please wear clothes that are easy to get on and off and wear slip-on shoes.  Thank you for allowing [place] to  care for you!   -- [place] [facility]

## 2021-12-24 LAB — BASIC METABOLIC PANEL
BUN/Creatinine Ratio: 11 — ABNORMAL LOW (ref 12–28)
BUN: 7 mg/dL — ABNORMAL LOW (ref 8–27)
CO2: 22 mmol/L (ref 20–29)
Calcium: 9.7 mg/dL (ref 8.7–10.3)
Chloride: 105 mmol/L (ref 96–106)
Creatinine, Ser: 0.64 mg/dL (ref 0.57–1.00)
Glucose: 118 mg/dL — ABNORMAL HIGH (ref 70–99)
Potassium: 4.5 mmol/L (ref 3.5–5.2)
Sodium: 141 mmol/L (ref 134–144)
eGFR: 100 mL/min/{1.73_m2} (ref >59)

## 2021-12-24 LAB — CBC
Hematocrit: 37.4 % (ref 34.0–46.6)
Hemoglobin: 12.5 g/dL (ref 11.1–15.9)
MCH: 28.7 pg (ref 26.6–33.0)
MCHC: 33.4 g/dL (ref 31.5–35.7)
MCV: 86 fL (ref 79–97)
Platelets: 255 10*3/uL (ref 150–450)
RBC: 4.35 x10E6/uL (ref 3.77–5.28)
RDW: 12.6 % (ref 11.7–15.4)
WBC: 6.9 10*3/uL (ref 3.4–10.8)

## 2021-12-24 LAB — BRAIN NATRIURETIC PEPTIDE: BNP: 181.3 pg/mL — ABNORMAL HIGH (ref 0.0–100.0)

## 2021-12-24 NOTE — Addendum Note (Signed)
Addended by: Meryl Crutch on: 12/24/2021 03:25 PM   Modules accepted: Orders

## 2021-12-25 ENCOUNTER — Telehealth: Payer: Self-pay | Admitting: *Deleted

## 2021-12-25 ENCOUNTER — Telehealth: Payer: Self-pay | Admitting: Cardiology

## 2021-12-25 ENCOUNTER — Encounter: Payer: Self-pay | Admitting: *Deleted

## 2021-12-25 NOTE — Telephone Encounter (Signed)
Pt called wanting to know how long will her cath take

## 2021-12-25 NOTE — Telephone Encounter (Signed)
Spoke to patient advised of cath time.

## 2021-12-25 NOTE — Telephone Encounter (Signed)
Cardiac Catheterization scheduled at [facility] for: Thursday December 26, 2021 10:30 AM Arrival time and place: [place] Entrance A at 8:30 AM    Nothing to eat after midnight prior to procedure, clear liquids until 5 AM day of procedure.  Medication instructions: -Usual morning medications can be taken with sips of water including aspirin 81 mg.  Confirmed patient has responsible adult to drive home post procedure and be with patient first 24 hours after arriving home.  Patient reports no new symptoms concerning for COVID-19 in the past 10 days.  Reviewed procedure instructions with patient.

## 2021-12-26 ENCOUNTER — Encounter (HOSPITAL_COMMUNITY): Payer: Self-pay | Admitting: Cardiovascular Disease

## 2021-12-26 ENCOUNTER — Ambulatory Visit (HOSPITAL_COMMUNITY)
Admission: RE | Admit: 2021-12-26 | Discharge: 2021-12-26 | Disposition: A | Discharge status: Home / Self Care | Payer: Medicare Other | Source: Ambulatory Visit | Attending: Cardiovascular Disease | Admitting: Cardiovascular Disease

## 2021-12-26 ENCOUNTER — Other Ambulatory Visit: Payer: Self-pay

## 2021-12-26 ENCOUNTER — Encounter (HOSPITAL_COMMUNITY)
Admission: RE | Disposition: A | Discharge status: Home / Self Care | Payer: Self-pay | Source: Ambulatory Visit | Attending: Cardiovascular Disease

## 2021-12-26 DIAGNOSIS — Z952 Presence of prosthetic heart valve: Secondary | ICD-10-CM | POA: Insufficient documentation

## 2021-12-26 DIAGNOSIS — G4730 Sleep apnea, unspecified: Secondary | ICD-10-CM | POA: Diagnosis not present

## 2021-12-26 DIAGNOSIS — J449 Chronic obstructive pulmonary disease, unspecified: Secondary | ICD-10-CM | POA: Diagnosis not present

## 2021-12-26 DIAGNOSIS — I340 Nonrheumatic mitral (valve) insufficiency: Secondary | ICD-10-CM | POA: Diagnosis present

## 2021-12-26 DIAGNOSIS — E785 Hyperlipidemia, unspecified: Secondary | ICD-10-CM | POA: Insufficient documentation

## 2021-12-26 DIAGNOSIS — I272 Pulmonary hypertension, unspecified: Secondary | ICD-10-CM | POA: Diagnosis not present

## 2021-12-26 DIAGNOSIS — R0602 Shortness of breath: Secondary | ICD-10-CM | POA: Diagnosis not present

## 2021-12-26 HISTORY — PX: RIGHT/LEFT HEART CATH AND CORONARY ANGIOGRAPHY: CATH118266

## 2021-12-26 LAB — POCT I-STAT EG7
Acid-base deficit: 2 mmol/L (ref 0.0–2.0)
Bicarbonate: 23.2 mmol/L (ref 20.0–28.0)
Calcium, Ion: 1.2 mmol/L (ref 1.15–1.40)
HCT: 35 % — ABNORMAL LOW (ref 36.0–46.0)
Hemoglobin: 11.9 g/dL — ABNORMAL LOW (ref 12.0–15.0)
O2 Saturation: 68 %
Potassium: 3.7 mmol/L (ref 3.5–5.1)
Sodium: 141 mmol/L (ref 135–145)
TCO2: 24 mmol/L (ref 22–32)
pCO2, Ven: 41.1 mmHg — ABNORMAL LOW (ref 44–60)
pH, Ven: 7.359 (ref 7.25–7.43)
pO2, Ven: 37 mmHg (ref 32–45)

## 2021-12-26 LAB — POCT I-STAT 7, (LYTES, BLD GAS, ICA,H+H)
Acid-base deficit: 3 mmol/L — ABNORMAL HIGH (ref 0.0–2.0)
Bicarbonate: 22 mmol/L (ref 20.0–28.0)
Calcium, Ion: 1.2 mmol/L (ref 1.15–1.40)
HCT: 35 % — ABNORMAL LOW (ref 36.0–46.0)
Hemoglobin: 11.9 g/dL — ABNORMAL LOW (ref 12.0–15.0)
O2 Saturation: 93 %
Potassium: 3.8 mmol/L (ref 3.5–5.1)
Sodium: 140 mmol/L (ref 135–145)
TCO2: 23 mmol/L (ref 22–32)
pCO2 arterial: 36.6 mmHg (ref 32–48)
pH, Arterial: 7.386 (ref 7.35–7.45)
pO2, Arterial: 66 mmHg — ABNORMAL LOW (ref 83–108)

## 2021-12-26 SURGERY — RIGHT/LEFT HEART CATH AND CORONARY ANGIOGRAPHY
Anesthesia: LOCAL

## 2021-12-26 MED ORDER — MIDAZOLAM HCL 2 MG/2ML IJ SOLN
INTRAMUSCULAR | Status: DC | PRN
  Administered 2021-12-26: 1 mg via INTRAVENOUS

## 2021-12-26 MED ORDER — LIDOCAINE HCL (PF) 1 % IJ SOLN
INTRAMUSCULAR | Status: AC
  Filled 2021-12-26: qty 30

## 2021-12-26 MED ORDER — VERAPAMIL HCL 2.5 MG/ML IV SOLN
INTRAVENOUS | Status: DC | PRN
  Administered 2021-12-26: 10 mL via INTRA_ARTERIAL

## 2021-12-26 MED ORDER — SODIUM CHLORIDE 0.9% FLUSH: 3.0000 mL | INTRAVENOUS | Status: DC | PRN

## 2021-12-26 MED ORDER — SODIUM CHLORIDE 0.9 % WEIGHT BASED INFUSION: 1.0000 mL/kg/h | INTRAVENOUS | Status: DC

## 2021-12-26 MED ORDER — SODIUM CHLORIDE 0.9 % IV SOLN: 250.0000 mL | INTRAVENOUS | Status: DC | PRN

## 2021-12-26 MED ORDER — ONDANSETRON HCL 4 MG/2ML IJ SOLN: 4.0000 mg | Freq: Four times a day (QID) | INTRAMUSCULAR | Status: DC | PRN

## 2021-12-26 MED ORDER — SODIUM CHLORIDE 0.9% FLUSH: 3.0000 mL | Freq: Two times a day (BID) | INTRAVENOUS | Status: DC

## 2021-12-26 MED ORDER — FENTANYL CITRATE (PF) 100 MCG/2ML IJ SOLN
INTRAMUSCULAR | Status: DC | PRN
  Administered 2021-12-26: 25 ug via INTRAVENOUS

## 2021-12-26 MED ORDER — ACETAMINOPHEN 325 MG PO TABS: 650.0000 mg | ORAL_TABLET | ORAL | Status: DC | PRN

## 2021-12-26 MED ORDER — HEPARIN SODIUM (PORCINE) 1000 UNIT/ML IJ SOLN
INTRAMUSCULAR | Status: DC | PRN
  Administered 2021-12-26: 3000 [IU] via INTRAVENOUS

## 2021-12-26 MED ORDER — IOHEXOL 350 MG/ML SOLN
INTRAVENOUS | Status: DC | PRN
  Administered 2021-12-26: 55 mL

## 2021-12-26 MED ORDER — HEPARIN (PORCINE) IN NACL 1000-0.9 UT/500ML-% IV SOLN
INTRAVENOUS | Status: AC
Start: 2021-12-26 — End: ?
  Filled 2021-12-26: qty 1000

## 2021-12-26 MED ORDER — FENTANYL CITRATE (PF) 100 MCG/2ML IJ SOLN
INTRAMUSCULAR | Status: AC
  Filled 2021-12-26: qty 2

## 2021-12-26 MED ORDER — HEPARIN SODIUM (PORCINE) 1000 UNIT/ML IJ SOLN
INTRAMUSCULAR | Status: AC
  Filled 2021-12-26: qty 10

## 2021-12-26 MED ORDER — VERAPAMIL HCL 2.5 MG/ML IV SOLN
INTRAVENOUS | Status: AC
  Filled 2021-12-26: qty 2

## 2021-12-26 MED ORDER — MIDAZOLAM HCL 2 MG/2ML IJ SOLN
INTRAMUSCULAR | Status: AC
  Filled 2021-12-26: qty 2

## 2021-12-26 MED ORDER — LABETALOL HCL 5 MG/ML IV SOLN: 10.0000 mg | INTRAVENOUS | Status: DC | PRN

## 2021-12-26 MED ORDER — LIDOCAINE HCL (PF) 1 % IJ SOLN
INTRAMUSCULAR | Status: DC | PRN
  Administered 2021-12-26 (×2): 2 mL via INTRADERMAL

## 2021-12-26 MED ORDER — SODIUM CHLORIDE 0.9% FLUSH
3.0000 mL | INTRAVENOUS | Status: DC | PRN
Start: 2021-12-26 — End: 2021-12-26

## 2021-12-26 MED ORDER — HEPARIN (PORCINE) IN NACL 1000-0.9 UT/500ML-% IV SOLN
INTRAVENOUS | Status: DC | PRN
  Administered 2021-12-26 (×2): 500 mL

## 2021-12-26 MED ORDER — HYDRALAZINE HCL 20 MG/ML IJ SOLN: 10.0000 mg | INTRAMUSCULAR | Status: DC | PRN

## 2021-12-26 MED ORDER — ASPIRIN 81 MG PO CHEW: 81.0000 mg | CHEWABLE_TABLET | ORAL | Status: DC

## 2021-12-26 MED ORDER — SODIUM CHLORIDE 0.9 % WEIGHT BASED INFUSION
3.0000 mL/kg/h | INTRAVENOUS | Status: AC
  Administered 2021-12-26: 3 mL/kg/h via INTRAVENOUS

## 2021-12-26 SURGICAL SUPPLY — 13 items
BAND CMPR LRG ZPHR (HEMOSTASIS) ×1
BAND ZEPHYR COMPRESS 30 LONG (HEMOSTASIS) IMPLANT
CATH 5FR JL3.5 JR4 ANG PIG MP (CATHETERS) IMPLANT
CATH BALLN WEDGE 5F 110CM (CATHETERS) IMPLANT
GLIDESHEATH SLEND SS 6F .021 (SHEATH) IMPLANT
GUIDEWIRE INQWIRE 1.5J.035X260 (WIRE) IMPLANT
INQWIRE 1.5J .035X260CM (WIRE) ×1
KIT HEART LEFT (KITS) ×2 IMPLANT
PACK CARDIAC CATHETERIZATION (CUSTOM PROCEDURE TRAY) ×2 IMPLANT
SHEATH GLIDE SLENDER 4/5FR (SHEATH) IMPLANT
SHEATH PROBE COVER 6X72 (BAG) IMPLANT
TRANSDUCER W/STOPCOCK (MISCELLANEOUS) ×2 IMPLANT
TUBING CIL FLEX 10 FLL-RA (TUBING) ×2 IMPLANT

## 2021-12-26 NOTE — Interval H&P Note (Signed)
History and Physical Interval Note:  12/26/2021 10:01 AM  Elaine Le  has presented today for surgery, with the diagnosis of mitral regurgitation.  The various methods of treatment have been discussed with the patient and family. After consideration of risks, benefits and other options for treatment, the patient has consented to  Procedure(s): RIGHT/LEFT HEART CATH AND CORONARY ANGIOGRAPHY (N/A) as a surgical intervention.  The patient's history has been reviewed, patient examined, no change in status, stable for surgery.  I have reviewed the patient's chart and labs.  Questions were answered to the patient's satisfaction.     Sherren Mocha

## 2021-12-26 NOTE — Progress Notes (Signed)
Pt put on call light '@[id number]'$  stating her hand and wrist hurts, on arrival to room hand was dusky again like on arrival but had pinked up after air removal, I do not feel a radial pulse and ulnar is present, on reverse barbeau the ulnar wave form was normal with flat wave from radial, I held pressure and page [facility] added 6 cc of air in zepher band. Wrist elevated while holding pressure, pt states she put her underwear on while in bed and may have bent wrist. Ria Comment NP assessed and will discuss with Dr Burt Knack. Both will be back to assess but I am to start removing air from band.

## 2022-01-02 ENCOUNTER — Other Ambulatory Visit: Payer: Self-pay | Admitting: *Deleted

## 2022-01-02 ENCOUNTER — Encounter: Payer: Self-pay | Admitting: Thoracic Surgery (Cardiothoracic Vascular Surgery)

## 2022-01-02 ENCOUNTER — Encounter: Payer: Self-pay | Admitting: *Deleted

## 2022-01-02 ENCOUNTER — Other Ambulatory Visit: Payer: Self-pay | Admitting: Thoracic Surgery (Cardiothoracic Vascular Surgery)

## 2022-01-02 ENCOUNTER — Institutional Professional Consult (permissible substitution) (INDEPENDENT_AMBULATORY_CARE_PROVIDER_SITE_OTHER): Payer: Medicare Other | Admitting: Thoracic Surgery (Cardiothoracic Vascular Surgery)

## 2022-01-02 VITALS — BP 160/76 | HR 90 | Resp 20 | Ht 60.0 in | Wt 116.0 lb

## 2022-01-02 DIAGNOSIS — I340 Nonrheumatic mitral (valve) insufficiency: Secondary | ICD-10-CM

## 2022-01-02 DIAGNOSIS — R7989 Other specified abnormal findings of blood chemistry: Secondary | ICD-10-CM

## 2022-01-02 DIAGNOSIS — R931 Abnormal findings on diagnostic imaging of heart and coronary circulation: Secondary | ICD-10-CM

## 2022-01-02 DIAGNOSIS — Z5181 Encounter for therapeutic drug level monitoring: Secondary | ICD-10-CM

## 2022-01-02 NOTE — Patient Instructions (Signed)
Non contrast CT scan Oct 12 for redo Mitral valve replacement with assesment at that time to determine if wishes to have after christmas holidays

## 2022-01-02 NOTE — Progress Notes (Signed)
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 Pt had an uneventful post operative recovery except for developing hives on the lower extremeties due to coumadin which resolved after it was discontinued. Pt has been doing well up to the past year when she developed COVID and from that point describes an acute change in her ability to walk distances as before needing to stop due to DOE. She has good days and bad days with the bad days needing to stop and rest after a flight of stairs or over 50 to 100 yrds. She has some palpitations but no lower ext edema. She has no CP. She had an echo 2/23 with now new severe MR and MS. This has been confirmed again with repeat echo this August. She has been followed but has felt that she should be considering reoperation and to that end was taken to the cath lab where PA pressures were moderately elevated with SPAP 3mHg. She had no evidence of CAD. Pt now presents for discussion of options moving forward.   Past Medical History:  Diagnosis Date   Acute respiratory [place] with [place]nia [place] [place] 097/98/9211  Acute systolic heart failure ([facility] 09/2014   Adenomatous colon polyp    Arthritis    Back pain    Chronic diastolic heart failure (HCC)    related to MR/MS   Chronic pain syndrome    Chronic sinusitis    COPD (chronic obstructive pulmonary disease)-PFT pending  07/17/2014   Diverticulosis    Fibromyalgia    GERD (gastroesophageal reflux disease)    Headaches, cluster    Hiatal hernia    Hx of adenomatous colonic polyps 03/10/2011   Oct 2012, repeat colon Oct 2017    Internal hemorrhoids     Irritable bowel syndrome (IBS)    Mitral valve regurgitation    severe   Mitral valve stenosis, moderate 07/11/2014   Protein-calorie malnutrition, severe ([facility] 07/06/2014   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/10/2014   Severe mitral regurgitation with moderate mitral stenosis   S/P minimally invasive mitral valve replacement with bioprosthetic valve 11/08/2014   [facility] valve placed via right mini thoracotomy approach   Tobacco abuse     Past Surgical History:  Procedure Laterality Date   CYSTOSTOMY W/ BLADDER DILATION     at age 61  LEFT AND RIGHT HEART CATHETERIZATION WITH CORONARY ANGIOGRAM N/A 07/27/2014   Procedure: LEFT AND RIGHT HEART CATHETERIZATION WITH CORONARY ANGIOGRAM;  Surgeon: CBurnell Blanks MD;  [facility];  Right left heart catheterization: Mild/minimal coronary artery disease. RV: 50/7/17, PA: 48/29 (mean 38), PCWP 33 mmHg   MITRAL VALVE REPAIR Right 11/08/2014   Procedure: MINIMALLY INVASIVE MITRAL VALVE REPLACEMENT(MVR);  Surgeon: CRexene Alberts MD;  [place]  Service: Open Heart Surgery;  Laterality: Right;   NEUROPLASTY / TRANSPOSITION MEDIAN NERVE AT CARPAL TUNNEL BILATERAL     RIGHT/LEFT HEART CATH AND CORONARY ANGIOGRAPHY N/A 12/26/2021   Procedure: RIGHT/LEFT HEART CATH AND CORONARY ANGIOGRAPHY;  Surgeon: CSherren Mocha MD;  [facility];  Service: Cardiovascular;  Laterality: N/A;   TEE WITHOUT CARDIOVERSION N/A 07/10/2014   Procedure: TRANSESOPHAGEAL ECHOCARDIOGRAM (TEE);  Surgeon: Lelon Perla, MD;  Location: [facility] ENDOSCOPY;  Service: Cardiovascular;  55-60%. No regional wall motion modalities. Moderate mitral stenosis with severe regurgitation and moderate to severely dilated left atrium.   TEE WITHOUT CARDIOVERSION N/A 11/08/2014   Procedure: TRANSESOPHAGEAL ECHOCARDIOGRAM (TEE);  Surgeon: Rexene Alberts, MD;  Location: [place];  Service: Open Heart Surgery;   Laterality: N/A;   TEE WITHOUT CARDIOVERSION N/A 06/07/2021   Procedure: TRANSESOPHAGEAL ECHOCARDIOGRAM (TEE);  Surgeon: Sueanne Margarita, MD;  Location: [facility] ENDOSCOPY;  Service: Cardiovascular;  Laterality: N/A;   THORACIC OUTLET SURGERY     TRANSTHORACIC ECHOCARDIOGRAM  07/04/2014   Normal LV Size/Fnx - EF 60-65% no RWMA; MV: thickened leaflets with doming, fixed Posterior leaflet, anterior leaflet w/ large/shaggy & mobile density.  c/w Rheumatic Mitral disease - moderate MS & Mod-Severe MR.   TUBAL LIGATION     VULVA SURGERY      Family History  Problem Relation Age of Onset   Kidney cancer Mother    Colon polyps Mother    Irritable bowel syndrome Mother    Diabetes Sister    Liver disease Sister    Irritable bowel syndrome Daughter    Breast cancer Paternal Aunt    Diabetes Brother    Colon cancer Neg Hx    Thyroid disease Neg Hx     Social History Social History   Tobacco Use   Smoking status: Former    Packs/day: 0.50    Years: 35.00    Total pack years: 17.50    Types: Cigarettes    Quit date: 06/17/2014    Years since quitting: 7.5   Smokeless tobacco: Never  Substance Use Topics   Alcohol use: No    Alcohol/week: 0.0 standard drinks of alcohol    Comment: rare   Drug use: No    Current Outpatient Medications  Medication Sig Dispense Refill   albuterol (VENTOLIN HFA) 108 (90 Base) MCG/ACT inhaler Inhale 2 puffs into the lungs every 6 (six) hours as needed. 18 g 5   clobetasol (TEMOVATE) 0.05 % external solution Apply 1 application  topically daily as needed (scalp irritation.).     estradiol (ESTRACE) 0.1 MG/GM vaginal cream 1 gram vaginally twice weekly (Patient taking differently: Place 1 Applicatorful vaginally daily as needed (vaginal swelling).) 42.5 g 6   fluticasone (FLONASE) 50 MCG/ACT nasal spray Place 2 sprays into both nostrils at bedtime.     ketoconazole (NIZORAL) 2 % shampoo Apply 1 Application topically daily as needed (scalp irritation.).      liothyronine (CYTOMEL) 5 MCG tablet Take 5 mcg by mouth daily.   2   metroNIDAZOLE (METROCREAM) 0.75 % cream Apply 1 application  topically in the morning. Face     SYNTHROID 75 MCG tablet Take 75 mcg by mouth daily before breakfast.      terconazole (TERAZOL 3) 0.8 % vaginal cream Place 1 applicator vaginally at bedtime. Use for 3 days. 20 g 1   aspirin EC 81 MG tablet Take 1 tablet (81 mg total) by mouth daily. Swallow whole. (Patient not taking: Reported on 01/02/2022) 30 tablet 12   furosemide (LASIX) 20 MG tablet Take 1 tablet (20 mg total) by mouth daily. 90 tablet 3   No current facility-administered medications for this visit.    Allergies  Allergen Reactions   Estrace [Estradiol] Hives   Reglan [Metoclopramide] Hives   Warfarin  And Related Hives and Rash   Other Hives and Other (See Comments)    Any antidepressants per pt- causes altered mental state, anger Other reaction(s): rash   Morphine And Related Other (See Comments)    Altered mental state-Patient reports she "made satan look sweet"     Singulair [Montelukast Sodium] Other (See Comments)    Intolerance - effects her mood    Statins Other (See Comments)    Myalgias with several statins     BP (!) 160/76   Pulse 90   Resp 20   Ht 5' (1.524 m)   Wt 116 lb (52.6 kg)   SpO2 95% Comment: RA  BMI 22.65 kg/m  Physical Exam: Directed physical exam Head and neck: teeth in good repair Lungs: clear Heart: RR with 2/6 sem to axilla Ext: warm and no edema  Diagnostic Tests: Cardiac Cath 8/23 Conclusion  1.  Patent coronary arteries with minimal irregularity, codominant coronary distribution 2.  Large V waves of 41 mmHg consistent with severe mitral regurgitation 3.  Mean transmitral gradient of 27 mmHg, calculated mitral valve area of 0.8 cm.  Gradients are falsely elevated from large V waves related to severe MR. 4.  Moderate pulmonary hypertension with mean PA pressure 41 mmHg, transpulmonary gradient 8 mmHg, PVR  less than 2 Wood units   Recommend: Surgical referral to evaluate treatment options for mitral valve replacement  ECHO 89/23  IMPRESSIONS 25 mm bioprosthesis. MG 14 @ 78 bpm. Emax 2.5 m/s, EOA 1.14 cm2, DVI 2.6, PHT 72 ms. All suggest severe regurgitation. Based on prior TEE, MR is severe. All parameters point to severe MR to explain elevated gradient. The mitral valve has been repaired/replaced. Severe mitral valve regurgitation. There is a 25 mm Edwards Magna bioprosthetic valve present in the mitral position. Procedure Date: 11/08/2014. 1. Left ventricular ejection fraction, by estimation, is 65 to 70%. The left ventricle has normal function. The left ventricle has no regional wall motion abnormalities. Left ventricular diastolic function could not be evaluated. 2. Right ventricular systolic function is normal. The right ventricular size is normal. Tricuspid regurgitation signal is inadequate for assessing PA pressure. 3. 4. Left atrial size was mild to moderately dilated. The aortic valve is tricuspid. Aortic valve regurgitation is not visualized. No aortic stenosis is present. 5. The inferior vena cava is normal in size with greater than 50% respiratory variability, suggesting right atrial pressure of 3 mmHg. 6. Comparison(s): No significant change from prior study. MR remains severe.  Impression: Prosthetic Mitral Valve deterioration with severe MR and MS    Pt has NYHA class 2 symptoms and severe prosthetic valve MR and MS. She has a class I indication for re-replacement . As this is her second upcomming cardiac procedure, mechanical prosthesis at her age would be best option however with her documented allergy to coumadin, this is not possible. She would best be served with repeat MVR with the tissue prosthesis Mitris with reselia treatment and hopefully will obtain longer longevity over the previous model. Pt and friend understand these issues and agree. Long term management  when this valve has need for re-replacement would most likely be transcatheter options. I have discussed the anticipated treatment of redo MVR via sternotomy this time and will obtain non contrast CT to finalize workup. All the risks and goals and anticipated recovery were discussed with pt and she agrees. We will schedule for early October but if pt feels she is doing well at that time, she may defer to  after christmas holidays. She knows to monitor her symptoms closely and to notify us if she worsens. 65 min were required to review chart and personally visualize studies and to discuss face to face with pt and to document.   COPD  Currently well controlled on medication. Had prolonged ventilator needs after pneumonia in 2017 but did ok after vavle surgery. Will monitor closely after surgery.  Plan: Redo MVR with resilia treated tissue prosthesis (Mitris) via sternotomy. Non contrast CT   [name], MD [facility] [phone]

## 2022-01-02 NOTE — Progress Notes (Deleted)
PCP is Biagio Borg, MD Referring Provider is Minus Breeding, MD  Chief Complaint  Patient presents with   Mitral Regurgitation    Surgical consult, HX of mini MVR 10/2014/ Cardiac Cath and ECHO 12/26/21/ TEE 06/07/21    HPI: ***  Past Medical History:  Diagnosis Date   Acute respiratory failure with hypercapnia ([place]) 79/39/0300   Acute systolic heart failure ([place]) 09/2014   Adenomatous colon polyp    Arthritis    Back pain    Chronic diastolic heart failure ([place])    related to MR/MS   Chronic pain syndrome    Chronic sinusitis    COPD (chronic obstructive pulmonary disease)-PFT pending  07/17/2014   Diverticulosis    Fibromyalgia    GERD (gastroesophageal reflux disease)    Headaches, cluster    Hiatal hernia    Hx of adenomatous colonic polyps 03/10/2011   Oct 2012, repeat colon Oct 2017    Internal hemorrhoids    Irritable bowel syndrome (IBS)    Mitral valve regurgitation    severe   Mitral valve stenosis, moderate 07/11/2014   Protein-calorie malnutrition, severe ([place]) 07/06/2014   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/10/2014   Severe mitral regurgitation with moderate mitral stenosis   S/P minimally invasive mitral valve replacement with bioprosthetic valve 11/08/2014   25 mm [facility] Mitral bovine bioprosthetic tissue valve placed via right mini thoracotomy approach   Tobacco abuse     Past Surgical History:  Procedure Laterality Date   CYSTOSTOMY W/ BLADDER DILATION     at age 61   LEFT AND RIGHT HEART CATHETERIZATION WITH CORONARY ANGIOGRAM N/A 07/27/2014   Procedure: LEFT AND RIGHT HEART CATHETERIZATION WITH CORONARY ANGIOGRAM;  Surgeon: Burnell Blanks, MD;  [facility] C[facility] LAB;  Right left heart catheterization: Mild/minimal coronary artery disease. RV: 50/7/17, PA: 48/29 (mean 38), PCWP 33 mmHg   MITRAL VALVE REPAIR Right 11/08/2014   Procedure: MINIMALLY INVASIVE MITRAL VALVE REPLACEMENT(MVR);  Surgeon: Rexene Alberts, MD;  Location: [place];  Service: Open Heart Surgery;  Laterality: Right;   NEUROPLASTY / TRANSPOSITION MEDIAN NERVE AT CARPAL TUNNEL BILATERAL     RIGHT/LEFT HEART CATH AND CORONARY ANGIOGRAPHY N/A 12/26/2021   Procedure: RIGHT/LEFT HEART CATH AND CORONARY ANGIOGRAPHY;  Surgeon: Sherren Mocha, MD;  Location: [place] CV LAB;  Service: Cardiovascular;  Laterality: N/A;   TEE WITHOUT CARDIOVERSION N/A 07/10/2014   Procedure: TRANSESOPHAGEAL ECHOCARDIOGRAM (TEE);  Surgeon: Lelon Perla, MD;  Location: [facility] ENDOSCOPY;  Service: Cardiovascular;  55-60%. No regional wall motion modalities. Moderate mitral stenosis with severe regurgitation and moderate to severely dilated left atrium.   TEE WITHOUT CARDIOVERSION N/A 11/08/2014   Procedure: TRANSESOPHAGEAL ECHOCARDIOGRAM (TEE);  Surgeon: Rexene Alberts, MD;  Location: [place];  Service: Open Heart Surgery;  Laterality: N/A;   TEE WITHOUT CARDIOVERSION N/A 06/07/2021   Procedure: TRANSESOPHAGEAL ECHOCARDIOGRAM (TEE);  Surgeon: Sueanne Margarita, MD;  Location: [facility] ENDOSCOPY;  Service: Cardiovascular;  Laterality: N/A;   THORACIC OUTLET SURGERY     TRANSTHORACIC ECHOCARDIOGRAM  07/04/2014   Normal LV Size/Fnx - EF 60-65% no RWMA; MV: thickened leaflets with doming, fixed Posterior leaflet, anterior leaflet w/ large/shaggy & mobile density.  c/w Rheumatic Mitral disease - moderate MS & Mod-Severe MR.   TUBAL LIGATION     VULVA SURGERY      Family History  Problem Relation Age of Onset   Kidney cancer Mother    Colon polyps Mother    Irritable bowel syndrome Mother  Diabetes Sister    Liver disease Sister    Irritable bowel syndrome Daughter    Breast cancer Paternal Aunt    Diabetes Brother    Colon cancer Neg Hx    Thyroid disease Neg Hx     Social History Social History   Tobacco Use   Smoking status: Former    Packs/day: 0.50    Years: 35.00    Total pack years: 17.50    Types: Cigarettes    Quit date: 06/17/2014    Years since quitting: 7.5    Smokeless tobacco: Never  Substance Use Topics   Alcohol use: No    Alcohol/week: 0.0 standard drinks of alcohol    Comment: rare   Drug use: No    Current Outpatient Medications  Medication Sig Dispense Refill   albuterol (VENTOLIN HFA) 108 (90 Base) MCG/ACT inhaler Inhale 2 puffs into the lungs every 6 (six) hours as needed. 18 g 5   aspirin EC 81 MG tablet Take 1 tablet (81 mg total) by mouth daily. Swallow whole. 30 tablet 12   clobetasol (TEMOVATE) 0.05 % external solution Apply 1 application  topically daily as needed (scalp irritation.).     estradiol (ESTRACE) 0.1 MG/GM vaginal cream 1 gram vaginally twice weekly (Patient taking differently: Place 1 Applicatorful vaginally daily as needed (vaginal swelling).) 42.5 g 6   fluticasone (FLONASE) 50 MCG/ACT nasal spray Place 2 sprays into both nostrils at bedtime.     furosemide (LASIX) 20 MG tablet Take 1 tablet (20 mg total) by mouth daily. 90 tablet 3   ketoconazole (NIZORAL) 2 % shampoo Apply 1 Application topically daily as needed (scalp irritation.).     liothyronine (CYTOMEL) 5 MCG tablet Take 5 mcg by mouth daily.   2   metroNIDAZOLE (METROCREAM) 0.75 % cream Apply 1 application  topically in the morning. Face     SYNTHROID 75 MCG tablet Take 75 mcg by mouth daily before breakfast.      terconazole (TERAZOL 3) 0.8 % vaginal cream Place 1 applicator vaginally at bedtime. Use for 3 days. 20 g 1   No current facility-administered medications for this visit.    Allergies  Allergen Reactions   Estrace [Estradiol] Hives   Reglan [Metoclopramide] Hives   Warfarin And Related Hives and Rash   Other Hives and Other (See Comments)    Any antidepressants per pt- causes altered mental state, anger Other reaction(s): rash   Morphine And Related Other (See Comments)    Altered mental state-Patient reports she "made satan look sweet"     Singulair [Montelukast Sodium] Other (See Comments)    Intolerance - effects her mood    Statins  Other (See Comments)    Myalgias with several statins    Review of Systems: *** ROS  Ht 5' (1.524 m)   Wt 117 lb (53.1 kg)   BMI 22.85 kg/m  Physical Exam: *** Physical Exam Constitutional:      Appearance: Normal appearance.  Neurological:     Mental Status: She is alert.     Diagnostic Tests: ***  Impression: ***  Plan: ***   [name], MD [facility] [phone]

## 2022-01-03 ENCOUNTER — Encounter: Payer: Self-pay | Admitting: *Deleted

## 2022-01-05 NOTE — Progress Notes (Unsigned)
Cardiology Office Note   Date:  01/06/2022   ID:  Elaine Le, DOB Feb 26, 1961, MRN [id number]  PCP:  Biagio Borg, MD  Cardiologist:   Minus Breeding, MD   Chief Complaint  Patient presents with   Shortness of Breath      History of Present Illness: Elaine Le is a 61 y.o. female who presents for follow-up after mitral valve replacement. She had probable rheumatic disease with severe regurgitation and moderate stenosis and is status post minimally invasive valve replacement.   TEE in Feb 2023 suggested moderate MR with moderate stenosis.  She had an TTE last week.   The mitral valve has severe regurgitation with a 25 mm [employer] Magna bioprosthetic valve.  There was severe MR.  The EF is 65%.    She has had increasing SOB.   She had no CAD on cath.  She had a large V wave on right heart cath with a mean PCWP of 45.    She aw Dr. Lavonna Monarch and MVR is planned for October.  She is doing well.  She does get short of breath as previously described.  She is not having any new chest pressure, neck or arm discomfort.  She is not having any new PND or orthopnea.  She has no new palpitations, presyncope or syncope.   Past Medical History:  Diagnosis Date   Acute respiratory failure with hypercapnia ([place]) 50/27/7412   Acute systolic heart failure ([place]) 09/2014   Adenomatous colon polyp    Arthritis    Back pain    Chronic diastolic heart failure (HCC)    related to MR/MS   Chronic pain syndrome    Chronic sinusitis    COPD (chronic obstructive pulmonary disease)-PFT pending  07/17/2014   Diverticulosis    Fibromyalgia    GERD (gastroesophageal reflux disease)    Headaches, cluster    Hiatal hernia    Hx of adenomatous colonic polyps 03/10/2011   Oct 2012, repeat colon Oct 2017    Internal hemorrhoids    Irritable bowel syndrome (IBS)    Mitral valve regurgitation    severe   Mitral valve stenosis, moderate 07/11/2014   Protein-calorie malnutrition, severe ([place])  07/06/2014   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/10/2014   Severe mitral regurgitation with moderate mitral stenosis   S/P minimally invasive mitral valve replacement with bioprosthetic valve 11/08/2014   25 mm [facility] Mitral bovine bioprosthetic tissue valve placed via right mini thoracotomy approach   Tobacco abuse     Past Surgical History:  Procedure Laterality Date   CYSTOSTOMY W/ BLADDER DILATION     at age 6   LEFT AND RIGHT HEART CATHETERIZATION WITH CORONARY ANGIOGRAM N/A 07/27/2014   Procedure: LEFT AND RIGHT HEART CATHETERIZATION WITH CORONARY ANGIOGRAM;  Surgeon: Burnell Blanks, MD;  Location: [facility] CATH LAB;  Right left heart catheterization: Mild/minimal coronary artery disease. RV: 50/7/17, PA: 48/29 (mean 38), PCWP 33 mmHg   MITRAL VALVE REPAIR Right 11/08/2014   Procedure: MINIMALLY INVASIVE MITRAL VALVE REPLACEMENT(MVR);  Surgeon: Rexene Alberts, MD;  Location: [place];  Service: Open Heart Surgery;  Laterality: Right;   NEUROPLASTY / TRANSPOSITION MEDIAN NERVE AT CARPAL TUNNEL BILATERAL     RIGHT/LEFT HEART CATH AND CORONARY ANGIOGRAPHY N/A 12/26/2021   Procedure: RIGHT/LEFT HEART CATH AND CORONARY ANGIOGRAPHY;  Surgeon: Sherren Mocha, MD;  Location: [place] CV LAB;  Service: Cardiovascular;  Laterality: N/A;   TEE WITHOUT CARDIOVERSION N/A 07/10/2014  Procedure: TRANSESOPHAGEAL ECHOCARDIOGRAM (TEE);  Surgeon: Lelon Perla, MD;  Location: [facility] ENDOSCOPY;  Service: Cardiovascular;  55-60%. No regional wall motion modalities. Moderate mitral stenosis with severe regurgitation and moderate to severely dilated left atrium.   TEE WITHOUT CARDIOVERSION N/A 11/08/2014   Procedure: TRANSESOPHAGEAL ECHOCARDIOGRAM (TEE);  Surgeon: Rexene Alberts, MD;  Location: [place];  Service: Open Heart Surgery;  Laterality: N/A;   TEE WITHOUT CARDIOVERSION N/A 06/07/2021   Procedure: TRANSESOPHAGEAL ECHOCARDIOGRAM (TEE);  Surgeon: Sueanne Margarita, MD;  Location: [facility] ENDOSCOPY;   Service: Cardiovascular;  Laterality: N/A;   THORACIC OUTLET SURGERY     TRANSTHORACIC ECHOCARDIOGRAM  07/04/2014   Normal LV Size/Fnx - EF 60-65% no RWMA; MV: thickened leaflets with doming, fixed Posterior leaflet, anterior leaflet w/ large/shaggy & mobile density.  c/w Rheumatic Mitral disease - moderate MS & Mod-Severe MR.   TUBAL LIGATION     VULVA SURGERY       Current Outpatient Medications  Medication Sig Dispense Refill   albuterol (VENTOLIN HFA) 108 (90 Base) MCG/ACT inhaler Inhale 2 puffs into the lungs every 6 (six) hours as needed. 18 g 5   clobetasol (TEMOVATE) 0.05 % external solution Apply 1 application  topically daily as needed (scalp irritation.).     estradiol (ESTRACE) 0.1 MG/GM vaginal cream 1 gram vaginally twice weekly (Patient taking differently: Place 1 Applicatorful vaginally daily as needed (vaginal swelling).) 42.5 g 6   furosemide (LASIX) 20 MG tablet Take 1 tablet (20 mg total) by mouth daily. (Patient taking differently: Take 20 mg by mouth as needed.) 90 tablet 3   ketoconazole (NIZORAL) 2 % shampoo Apply 1 Application topically daily as needed (scalp irritation.).     liothyronine (CYTOMEL) 5 MCG tablet Take 5 mcg by mouth daily.   2   metroNIDAZOLE (METROCREAM) 0.75 % cream Apply 1 application  topically in the morning. Face     SYNTHROID 75 MCG tablet Take 75 mcg by mouth daily before breakfast.      terconazole (TERAZOL 3) 0.8 % vaginal cream Place 1 applicator vaginally at bedtime. Use for 3 days. 20 g 1   aspirin EC 81 MG tablet Take 1 tablet (81 mg total) by mouth daily. Swallow whole. (Patient not taking: Reported on 01/02/2022) 30 tablet 12   fluticasone (FLONASE) 50 MCG/ACT nasal spray Place 2 sprays into both nostrils at bedtime. (Patient not taking: Reported on 01/06/2022)     No current facility-administered medications for this visit.    Allergies:   Estrace [estradiol], Reglan [metoclopramide], Warfarin and related, Other, Morphine and related,  Singulair [montelukast sodium], and Statins    ROS:  Please see the history of present illness.   Otherwise, review of systems are positive for none . All other systems are reviewed and negative.    PHYSICAL EXAM: VS:  BP 119/77   Pulse 88   Ht 5' (1.524 m)   Wt 119 lb 3.2 oz (54.1 kg)   SpO2 97%   BMI 23.28 kg/m  , BMI Body mass index is 23.28 kg/m.  GENERAL:  Well appearing NECK:  No jugular venous distention, waveform within normal limits, carotid upstroke brisk and symmetric, no bruits, no thyromegaly LUNGS:  Clear to auscultation bilaterally CHEST:  Well healed sternotomy scar. HEART:  PMI not displaced or sustained,S1 and S2 within normal limits, no S3, no S4, no clicks, no rubs, 3 out of 6 holosystolic murmur heard at the apex and radiating to the axilla and anteriorly, no diastolic murmurs ABD:  Flat, positive bowel sounds normal in frequency in pitch, no bruits, no rebound, no guarding, no midline pulsatile mass, no hepatomegaly, no splenomegaly EXT:  2 plus pulses throughout, no edema, no cyanosis no clubbing, bruising at the right radial access site without hematoma or oozing   EKG:  EKG is not ordered today.  Recent Labs: 11/09/2014: Magnesium 2.1 01/10/2015: ALT 27; BUN 9; Creatinine, Ser 0.70; Potassium 4.3; Sodium 139 02/01/2015: Hemoglobin 15.1*; Platelets 292.0 03/29/2015: TSH 4.03    Lipid Panel    Component Value Date/Time   CHOL 256 (H) 06/26/2020 1124   TRIG 50.0 06/26/2020 1124   HDL 73.50 06/26/2020 1124   CHOLHDL 3 06/26/2020 1124   VLDL 10.0 06/26/2020 1124   LDLCALC 172 (H) 06/26/2020 1124      Wt Readings from Last 3 Encounters:  01/06/22 119 lb 3.2 oz (54.1 kg)  01/02/22 116 lb (52.6 kg)  12/26/21 117 lb (53.1 kg)      Other studies Reviewed: Additional studies/ records that were reviewed today include: Cath, surgical note    ASSESSMENT AND PLAN:  MV REPLACEMENT:   TTE suggested severe MR.   Cath confirmed this.  She is to have MVR.   I reviewed the procedure with her and the choice of open sternotomy with bioprosthetic valve.  I agree with both of these things.  She thinks she will proceed in October as planned.  No change in therapies.   DYSLIPIDEMIA:    She has been intolerant of statins.  She had no obstructive coronary disease.  No change in therapy.  SLEEP APNEA:   She continues to use CPAP.   Current medicines are reviewed at length with the patient today.  The patient does not have concerns regarding medicines.  The following changes have been made: None  Labs/ tests ordered today include:    Disposition:   FU with me in six months.    Signed, Minus Breeding, MD  01/06/2022 11:36 AM    [place]

## 2022-01-06 ENCOUNTER — Encounter: Payer: Self-pay | Admitting: Cardiology

## 2022-01-06 ENCOUNTER — Ambulatory Visit: Payer: Medicare Other | Attending: Cardiology | Admitting: Cardiology

## 2022-01-06 VITALS — BP 119/77 | HR 88 | Ht 60.0 in | Wt 119.2 lb

## 2022-01-06 DIAGNOSIS — I340 Nonrheumatic mitral (valve) insufficiency: Secondary | ICD-10-CM

## 2022-01-06 DIAGNOSIS — R0602 Shortness of breath: Secondary | ICD-10-CM | POA: Diagnosis not present

## 2022-01-06 DIAGNOSIS — E785 Hyperlipidemia, unspecified: Secondary | ICD-10-CM | POA: Diagnosis not present

## 2022-01-06 DIAGNOSIS — G4730 Sleep apnea, unspecified: Secondary | ICD-10-CM

## 2022-01-06 NOTE — Patient Instructions (Signed)
Medication Instructions:  Your physician recommends that you continue on your current medications as directed. Please refer to the Current Medication list given to you today.  *If you need a refill on your cardiac medications before your next appointment, please call your pharmacy*  Follow-Up: At [facility], you and your health needs are our priority.  As part of our continuing mission to provide you with exceptional heart care, we have created designated Provider Care Teams.  These Care Teams include your primary Cardiologist (physician) and Advanced Practice Providers (APPs -  Physician Assistants and Nurse Practitioners) who all work together to provide you with the care you need, when you need it.  We recommend signing up for the patient portal called "MyChart".  Sign up information is provided on this After Visit Summary.  MyChart is used to connect with patients for Virtual Visits (Telemedicine).  Patients are able to view lab/test results, encounter notes, upcoming appointments, etc.  Non-urgent messages can be sent to your provider as well.   To learn more about what you can do with MyChart, go to [email or web address].    Your next appointment:   November with Dr. Percival Spanish

## 2022-01-08 ENCOUNTER — Ambulatory Visit: Payer: Medicare Other | Admitting: Internal Medicine

## 2022-01-15 LAB — COLOGUARD

## 2022-01-15 LAB — EXTERNAL GENERIC LAB PROCEDURE

## 2022-01-22 ENCOUNTER — Ambulatory Visit (INDEPENDENT_AMBULATORY_CARE_PROVIDER_SITE_OTHER): Payer: Medicare Other

## 2022-01-22 ENCOUNTER — Other Ambulatory Visit (HOSPITAL_COMMUNITY)
Admission: RE | Admit: 2022-01-22 | Discharge: 2022-01-22 | Disposition: A | Discharge status: Home / Self Care | Payer: Medicare Other | Source: Ambulatory Visit | Attending: Obstetrics & Gynecology | Admitting: Obstetrics & Gynecology

## 2022-01-22 DIAGNOSIS — R300 Dysuria: Secondary | ICD-10-CM | POA: Diagnosis not present

## 2022-01-22 DIAGNOSIS — N898 Other specified noninflammatory disorders of vagina: Secondary | ICD-10-CM | POA: Insufficient documentation

## 2022-01-22 LAB — POCT URINALYSIS DIPSTICK
Bilirubin, UA: NEGATIVE
Glucose, UA: NEGATIVE
Ketones, UA: NEGATIVE
Nitrite, UA: NEGATIVE
Protein, UA: NEGATIVE
Spec Grav, UA: 1.01 (ref 1.010–1.025)
Urobilinogen, UA: 0.2 E.U./dL
pH, UA: 7 (ref 5.0–8.0)

## 2022-01-22 MED ORDER — NITROFURANTOIN MONOHYD MACRO 100 MG PO CAPS: 100.0000 mg | ORAL_CAPSULE | Freq: Two times a day (BID) | ORAL | 0 refills | Status: DC

## 2022-01-22 NOTE — Progress Notes (Signed)
Patient came in today with complaints vaginal discharge, itching and foul urine odor. Patient submitted an aptima swab and a urine sample. Per protocol for positive results, I have sent in the Macrobid '100mg'$  bid X5 days. Patient declined the Pyridium '200mg'$  tid X2days. Medication was sent into the requested pharmacy. Urine culture sent. tbw

## 2022-01-23 LAB — URINE CULTURE

## 2022-01-23 LAB — CERVICOVAGINAL ANCILLARY ONLY
Bacterial Vaginitis (gardnerella): POSITIVE — AB
Candida Glabrata: NEGATIVE
Candida Vaginitis: NEGATIVE
Comment: NEGATIVE
Comment: NEGATIVE
Comment: NEGATIVE

## 2022-01-24 ENCOUNTER — Ambulatory Visit
Admission: RE | Admit: 2022-01-24 | Discharge: 2022-01-24 | Disposition: A | Discharge status: Home / Self Care | Payer: Medicare Other | Source: Ambulatory Visit | Attending: Thoracic Surgery (Cardiothoracic Vascular Surgery) | Admitting: Thoracic Surgery (Cardiothoracic Vascular Surgery)

## 2022-01-24 DIAGNOSIS — I340 Nonrheumatic mitral (valve) insufficiency: Secondary | ICD-10-CM

## 2022-01-24 DIAGNOSIS — R931 Abnormal findings on diagnostic imaging of heart and coronary circulation: Secondary | ICD-10-CM

## 2022-01-27 ENCOUNTER — Other Ambulatory Visit (HOSPITAL_BASED_OUTPATIENT_CLINIC_OR_DEPARTMENT_OTHER): Payer: Self-pay | Admitting: *Deleted

## 2022-01-27 DIAGNOSIS — N760 Acute vaginitis: Secondary | ICD-10-CM

## 2022-01-27 MED ORDER — TERCONAZOLE 0.8 % VA CREA: 1.0000 | TOPICAL_CREAM | Freq: Every day | VAGINAL | 1 refills | Status: DC

## 2022-01-27 MED ORDER — METRONIDAZOLE 500 MG PO TABS: 500.0000 mg | ORAL_TABLET | Freq: Two times a day (BID) | ORAL | 0 refills | Status: DC

## 2022-02-03 ENCOUNTER — Ambulatory Visit (INDEPENDENT_AMBULATORY_CARE_PROVIDER_SITE_OTHER): Payer: Medicare Other | Admitting: Thoracic Surgery (Cardiothoracic Vascular Surgery)

## 2022-02-03 ENCOUNTER — Other Ambulatory Visit (HOSPITAL_COMMUNITY): Payer: Medicare Other

## 2022-02-03 ENCOUNTER — Encounter: Payer: Self-pay | Admitting: Thoracic Surgery (Cardiothoracic Vascular Surgery)

## 2022-02-03 VITALS — BP 149/115 | HR 92 | Resp 18 | Ht 60.0 in | Wt 118.0 lb

## 2022-02-03 DIAGNOSIS — I340 Nonrheumatic mitral (valve) insufficiency: Secondary | ICD-10-CM

## 2022-02-03 NOTE — Progress Notes (Signed)
[address]       [place] [address]             [phone]           Elaine Le Underwood Medical Record #[id number] Date of Birth: [birth year]  Minus Breeding, MD Biagio Borg, MD  Chief Complaint:   follow up prosthetic mitral valve stenosis and regurgitation  History of Present Illness:     Pt known to me from earlier evaluation last month for prosthetic mitral valve stenosis and regurgitation. She has had her CT scan completed and has seen cardiology. She feels her breathing is not very good and wishes to proceed next Tuesday with redo surgery       Past Medical History:  Diagnosis Date   Acute respiratory failure with hypercapnia ([place]) 67/67/2094   Acute systolic heart failure ([place]) 09/2014   Adenomatous colon polyp    Arthritis    Back pain    Chronic diastolic heart failure (HCC)    related to MR/MS   Chronic pain syndrome    Chronic sinusitis    COPD (chronic obstructive pulmonary disease)-PFT pending  07/17/2014   Diverticulosis    Fibromyalgia    GERD (gastroesophageal reflux disease)    Headaches, cluster    Hiatal hernia    Hx of adenomatous colonic polyps 03/10/2011   Oct 2012, repeat colon Oct 2017    Internal hemorrhoids    Irritable bowel syndrome (IBS)    Mitral valve regurgitation    severe   Mitral valve stenosis, moderate 07/11/2014   Protein-calorie malnutrition, severe ([place]) 07/06/2014   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/10/2014   Severe mitral regurgitation with moderate mitral stenosis   S/P minimally invasive mitral valve replacement with bioprosthetic valve 11/08/2014   25 mm [facility] Mitral bovine bioprosthetic tissue valve placed via right mini thoracotomy approach   Tobacco abuse     Past Surgical History:  Procedure Laterality Date   CYSTOSTOMY W/ BLADDER DILATION     at age 28   LEFT AND RIGHT HEART CATHETERIZATION WITH CORONARY ANGIOGRAM N/A 07/27/2014   Procedure: LEFT AND RIGHT  HEART CATHETERIZATION WITH CORONARY ANGIOGRAM;  Surgeon: Burnell Blanks, MD;  Location: [facility] CATH LAB;  Right left heart catheterization: Mild/minimal coronary artery disease. RV: 50/7/17, PA: 48/29 (mean 38), PCWP 33 mmHg   MITRAL VALVE REPAIR Right 11/08/2014   Procedure: MINIMALLY INVASIVE MITRAL VALVE REPLACEMENT(MVR);  Surgeon: Rexene Alberts, MD;  Location: [place];  Service: Open Heart Surgery;  Laterality: Right;   NEUROPLASTY / TRANSPOSITION MEDIAN NERVE AT CARPAL TUNNEL BILATERAL     RIGHT/LEFT HEART CATH AND CORONARY ANGIOGRAPHY N/A 12/26/2021   Procedure: RIGHT/LEFT HEART CATH AND CORONARY ANGIOGRAPHY;  Surgeon: Sherren Mocha, MD;  Location: [place] CV LAB;  Service: Cardiovascular;  Laterality: N/A;   TEE WITHOUT CARDIOVERSION N/A 07/10/2014   Procedure: TRANSESOPHAGEAL ECHOCARDIOGRAM (TEE);  Surgeon: Lelon Perla, MD;  Location: [facility] ENDOSCOPY;  Service: Cardiovascular;  55-60%. No regional wall motion modalities. Moderate mitral stenosis with severe regurgitation and moderate to severely dilated left atrium.   TEE WITHOUT CARDIOVERSION N/A 11/08/2014   Procedure: TRANSESOPHAGEAL ECHOCARDIOGRAM (TEE);  Surgeon: Rexene Alberts, MD;  Location: [place];  Service: Open Heart Surgery;  Laterality: N/A;   TEE WITHOUT CARDIOVERSION N/A 06/07/2021   Procedure: TRANSESOPHAGEAL ECHOCARDIOGRAM (TEE);  Surgeon: Sueanne Margarita, MD;  Location: [place];  Service: Cardiovascular;  Laterality: N/A;   THORACIC OUTLET SURGERY  TRANSTHORACIC ECHOCARDIOGRAM  07/04/2014   Normal LV Size/Fnx - EF 60-65% no RWMA; MV: thickened leaflets with doming, fixed Posterior leaflet, anterior leaflet w/ large/shaggy & mobile density.  c/w Rheumatic Mitral disease - moderate MS & Mod-Severe MR.   TUBAL LIGATION     VULVA SURGERY      Social History   Tobacco Use  Smoking Status Former   Packs/day: 0.50   Years: 35.00   Total pack years: 17.50   Types: Cigarettes   Quit date: 06/17/2014    Years since quitting: 7.6  Smokeless Tobacco Never    Social History   Substance and Sexual Activity  Alcohol Use No   Alcohol/week: 0.0 standard drinks of alcohol   Comment: rare    Social History   Socioeconomic History   Marital status: Single    Spouse name: Not on file   Number of children: 1   Years of education: Not on file   Highest education level: Not on file  Occupational History   Occupation: disabled  Tobacco Use   Smoking status: Former    Packs/day: 0.50    Years: 35.00    Total pack years: 17.50    Types: Cigarettes    Quit date: 06/17/2014    Years since quitting: 7.6   Smokeless tobacco: Never  Substance and Sexual Activity   Alcohol use: No    Alcohol/week: 0.0 standard drinks of alcohol    Comment: rare   Drug use: No   Sexual activity: Never  Other Topics Concern   Not on file  Social History Narrative   Not on file   Social Determinants of Health   Financial Resource Strain: Not on file  Food Insecurity: Not on file  Transportation Needs: Not on file  Physical Activity: Not on file  Stress: Not on file  Social Connections: Not on file  Intimate Partner Violence: Not on file    Allergies  Allergen Reactions   Estrace [Estradiol] Hives   Reglan [Metoclopramide] Hives   Warfarin And Related Hives and Rash   Other Hives and Other (See Comments)    Any antidepressants per pt- causes altered mental state, anger Other reaction(s): rash   Morphine And Related Other (See Comments)    Altered mental state-Patient reports she "made satan look sweet"     Singulair [Montelukast Sodium] Other (See Comments)    Intolerance - effects her mood    Statins Other (See Comments)    Myalgias with several statins    Current Outpatient Medications  Medication Sig Dispense Refill   albuterol (VENTOLIN HFA) 108 (90 Base) MCG/ACT inhaler Inhale 2 puffs into the lungs every 6 (six) hours as needed. 18 g 5   aspirin EC 81 MG tablet Take 1 tablet (81 mg  total) by mouth daily. Swallow whole. (Patient not taking: Reported on 01/02/2022) 30 tablet 12   clobetasol (TEMOVATE) 0.05 % external solution Apply 1 application  topically daily as needed (scalp irritation.).     estradiol (ESTRACE) 0.1 MG/GM vaginal cream 1 gram vaginally twice weekly (Patient taking differently: Place 1 Applicatorful vaginally daily as needed (vaginal swelling).) 42.5 g 6   fluticasone (FLONASE) 50 MCG/ACT nasal spray Place 2 sprays into both nostrils at bedtime. (Patient not taking: Reported on 01/06/2022)     furosemide (LASIX) 20 MG tablet Take 1 tablet (20 mg total) by mouth daily. (Patient taking differently: Take 20 mg by mouth as needed.) 90 tablet 3   ketoconazole (NIZORAL) 2 % shampoo Apply  1 Application topically daily as needed (scalp irritation.).     liothyronine (CYTOMEL) 5 MCG tablet Take 5 mcg by mouth daily.   2   metroNIDAZOLE (FLAGYL) 500 MG tablet Take 1 tablet (500 mg total) by mouth 2 (two) times daily. 14 tablet 0   metroNIDAZOLE (METROCREAM) 0.75 % cream Apply 1 application  topically in the morning. Face     nitrofurantoin, macrocrystal-monohydrate, (MACROBID) 100 MG capsule Take 1 capsule (100 mg total) by mouth 2 (two) times daily. 14 capsule 0   SYNTHROID 75 MCG tablet Take 75 mcg by mouth daily before breakfast.      terconazole (TERAZOL 3) 0.8 % vaginal cream Place 1 applicator vaginally at bedtime. Use for 3 days. 20 g 1   No current facility-administered medications for this visit.     Family History  Problem Relation Age of Onset   Kidney cancer Mother    Colon polyps Mother    Irritable bowel syndrome Mother    Diabetes Sister    Liver disease Sister    Irritable bowel syndrome Daughter    Breast cancer Paternal Aunt    Diabetes Brother    Colon cancer Neg Hx    Thyroid disease Neg Hx       Physical Exam: There were no vitals taken for this visit. Lungs: distant bs Card: RR with 2/6 sem Ext: warm and dry        Recent  Radiology Findings:  CT SCAN 12/2021 FINDINGS: Cardiovascular: Aortic atherosclerosis. Mild cardiomegaly. Mitral valve prosthesis. Left coronary artery calcifications. No pericardial effusion.   Mediastinum/Nodes: Numerous enlarged mediastinal lymph nodes, unchanged, largest pretracheal nodes measuring up to 2.3 x 1.8 cm (series 2, image 54). Thyroid gland, trachea, and esophagus demonstrate no significant findings.   Lungs/Pleura: Severe centrilobular emphysema. Diffuse bilateral bronchial wall thickening and interlobular septal thickening. Trace bilateral pleural effusions. Small, fat containing right-sided Bochdalek's hernia.   Upper Abdomen: No acute abnormality.   Musculoskeletal: No chest wall abnormality. No acute osseous findings.   IMPRESSION: 1. Severe emphysema. 2. Diffuse bilateral bronchial wall thickening, interlobular septal thickening, and trace bilateral pleural effusions, consistent with pulmonary edema. 3. Cardiomegaly and coronary artery disease. Mitral valve prosthesis. 4. Numerous enlarged mediastinal lymph nodes, unchanged, benign and reactive.   Aortic Atherosclerosis (ICD10-I70.0) and Emphysema (ICD10-J43.9).      Recent Lab Findings: Lab Results  Component Value Date   WBC 6.9 12/23/2021   HGB 11.9 (L) 12/26/2021   HGB 11.9 (L) 12/26/2021   HCT 35.0 (L) 12/26/2021   HCT 35.0 (L) 12/26/2021   PLT 255 12/23/2021   GLUCOSE 118 (H) 12/23/2021   CHOL 256 (H) 06/26/2020   TRIG 50.0 06/26/2020   HDL 73.50 06/26/2020   LDLCALC 172 (H) 06/26/2020   ALT 29 06/26/2020   AST 21 06/26/2020   NA 140 12/26/2021   NA 141 12/26/2021   K 3.8 12/26/2021   K 3.7 12/26/2021   CL 105 12/23/2021   CREATININE 0.64 12/23/2021   BUN 7 (L) 12/23/2021   CO2 22 12/23/2021   TSH 0.288 (L) 05/23/2021   INR 1.0 06/26/2020   HGBA1C 5.5 06/26/2020      Assessment / Plan:      Prosthetic Mitral Valve Stenosis and Regurgitation Will proceed with redo surgery  next Tuesday. All the risks and goals of surgery were discussed and pt understands and wishes to proceed. The CT scan has no concerning findings other than that description of severe Emphysema. Will review prior PFTs  but the pt understands that her history of intubation with her pneumonias and her COPD make her respiratory complications higher post operatively.  Over 30 min were taken to review records and discussion of above with the pt face to face and coordinating future care     [name] 02/03/2022 8:33 AM

## 2022-02-05 ENCOUNTER — Other Ambulatory Visit (HOSPITAL_BASED_OUTPATIENT_CLINIC_OR_DEPARTMENT_OTHER): Payer: Self-pay | Admitting: *Deleted

## 2022-02-05 DIAGNOSIS — R300 Dysuria: Secondary | ICD-10-CM

## 2022-02-05 MED ORDER — NITROFURANTOIN MONOHYD MACRO 100 MG PO CAPS: 100.0000 mg | ORAL_CAPSULE | Freq: Two times a day (BID) | ORAL | 0 refills | Status: AC

## 2022-02-05 NOTE — Progress Notes (Signed)
Rx sent to pharmacy for continued urinary symptoms. Advised pt that if symptoms still present after completion of course, she would need to come in for appt.

## 2022-02-06 ENCOUNTER — Other Ambulatory Visit (HOSPITAL_COMMUNITY): Payer: Self-pay | Admitting: *Deleted

## 2022-02-07 ENCOUNTER — Encounter (HOSPITAL_COMMUNITY): Payer: Self-pay

## 2022-02-07 ENCOUNTER — Encounter (HOSPITAL_COMMUNITY)
Admission: RE | Admit: 2022-02-07 | Discharge: 2022-02-07 | Disposition: A | Discharge status: Home / Self Care | Payer: Medicare Other | Source: Ambulatory Visit | Attending: Thoracic Surgery (Cardiothoracic Vascular Surgery) | Admitting: Thoracic Surgery (Cardiothoracic Vascular Surgery)

## 2022-02-07 ENCOUNTER — Other Ambulatory Visit (HOSPITAL_COMMUNITY): Payer: Self-pay | Admitting: *Deleted

## 2022-02-07 ENCOUNTER — Other Ambulatory Visit: Payer: Self-pay

## 2022-02-07 ENCOUNTER — Ambulatory Visit (HOSPITAL_COMMUNITY)
Admission: RE | Admit: 2022-02-07 | Discharge: 2022-02-07 | Disposition: A | Discharge status: Home / Self Care | Payer: Medicare Other | Source: Ambulatory Visit | Attending: Thoracic Surgery (Cardiothoracic Vascular Surgery) | Admitting: Thoracic Surgery (Cardiothoracic Vascular Surgery)

## 2022-02-07 VITALS — BP 136/90 | HR 100 | Temp 98.8°F | Resp 18 | Ht 60.0 in | Wt 118.0 lb

## 2022-02-07 DIAGNOSIS — Z1152 Encounter for screening for COVID-19: Secondary | ICD-10-CM | POA: Insufficient documentation

## 2022-02-07 DIAGNOSIS — Z01818 Encounter for other preprocedural examination: Secondary | ICD-10-CM | POA: Diagnosis present

## 2022-02-07 DIAGNOSIS — Z5181 Encounter for therapeutic drug level monitoring: Secondary | ICD-10-CM | POA: Diagnosis not present

## 2022-02-07 DIAGNOSIS — I340 Nonrheumatic mitral (valve) insufficiency: Secondary | ICD-10-CM | POA: Diagnosis not present

## 2022-02-07 DIAGNOSIS — R7989 Other specified abnormal findings of blood chemistry: Secondary | ICD-10-CM | POA: Insufficient documentation

## 2022-02-07 HISTORY — DX: Cardiac murmur, unspecified: R01.1

## 2022-02-07 HISTORY — DX: Hypothyroidism, unspecified: E03.9

## 2022-02-07 HISTORY — DX: Sleep apnea, unspecified: G47.30

## 2022-02-07 HISTORY — DX: Dyspnea, unspecified: R06.00

## 2022-02-07 LAB — CBC
HCT: 38.6 % (ref 36.0–46.0)
Hemoglobin: 13.1 g/dL (ref 12.0–15.0)
MCH: 29.6 pg (ref 26.0–34.0)
MCHC: 33.9 g/dL (ref 30.0–36.0)
MCV: 87.3 fL (ref 80.0–100.0)
Platelets: 234 10*3/uL (ref 150–400)
RBC: 4.42 MIL/uL (ref 3.87–5.11)
RDW: 13.4 % (ref 11.5–15.5)
WBC: 6.6 10*3/uL (ref 4.0–10.5)
nRBC: 0 % (ref 0.0–0.2)

## 2022-02-07 LAB — URINALYSIS, ROUTINE W REFLEX MICROSCOPIC
Bilirubin Urine: NEGATIVE
Glucose, UA: NEGATIVE mg/dL
Hgb urine dipstick: NEGATIVE
Ketones, ur: NEGATIVE mg/dL
Leukocytes,Ua: NEGATIVE
Nitrite: NEGATIVE
Protein, ur: NEGATIVE mg/dL
Specific Gravity, Urine: 1.002 — ABNORMAL LOW (ref 1.005–1.030)
pH: 7 (ref 5.0–8.0)

## 2022-02-07 LAB — PROTIME-INR
INR: 1 (ref 0.8–1.2)
Prothrombin Time: 13.5 seconds (ref 11.4–15.2)

## 2022-02-07 LAB — BLOOD GAS, ARTERIAL
Acid-Base Excess: 0.9 mmol/L (ref 0.0–2.0)
Bicarbonate: 24 mmol/L (ref 20.0–28.0)
Drawn by: 6643
O2 Saturation: 98.6 %
Patient temperature: 37
pCO2 arterial: 33 mmHg (ref 32–48)
pH, Arterial: 7.47 — ABNORMAL HIGH (ref 7.35–7.45)
pO2, Arterial: 103 mmHg (ref 83–108)

## 2022-02-07 LAB — SURGICAL PCR SCREEN
MRSA, PCR: NEGATIVE
Staphylococcus aureus: NEGATIVE

## 2022-02-07 LAB — COMPREHENSIVE METABOLIC PANEL
ALT: 26 U/L (ref 0–44)
AST: 28 U/L (ref 15–41)
Albumin: 3.8 g/dL (ref 3.5–5.0)
Alkaline Phosphatase: 85 U/L (ref 38–126)
Anion gap: 7 (ref 5–15)
BUN: 10 mg/dL (ref 8–23)
CO2: 24 mmol/L (ref 22–32)
Calcium: 8.7 mg/dL — ABNORMAL LOW (ref 8.9–10.3)
Chloride: 99 mmol/L (ref 98–111)
Creatinine, Ser: 0.72 mg/dL (ref 0.44–1.00)
GFR, Estimated: >60 mL/min (ref >60)
Glucose, Bld: 114 mg/dL — ABNORMAL HIGH (ref 70–99)
Potassium: 3.9 mmol/L (ref 3.5–5.1)
Sodium: 130 mmol/L — ABNORMAL LOW (ref 135–145)
Total Bilirubin: 0.8 mg/dL (ref 0.3–1.2)
Total Protein: 6.2 g/dL — ABNORMAL LOW (ref 6.5–8.1)

## 2022-02-07 LAB — HEMOGLOBIN A1C
Hgb A1c MFr Bld: 5 % (ref 4.8–5.6)
Mean Plasma Glucose: 96.8 mg/dL

## 2022-02-07 LAB — APTT: aPTT: 28 seconds (ref 24–36)

## 2022-02-07 NOTE — Progress Notes (Addendum)
PCP - Dr.James John Cardiologist - Dr.James Hochrein  PPM/ICD - denies Device Orders - n/a Rep Notified - n/a  Chest x-ray - 02/07/22 EKG - 02/07/22 Stress Test - denies ECHO - 12/16/21 Cardiac Cath - 12/26/21  Sleep Study - YES CPAP - setting 7   DM-denies Blood Thinner Instructions:denies Aspirin Instructions:denies  NPO after MD  COVID TEST- 02/07/22   Anesthesia review: YES, cardiac history.   Patient denies shortness of breath, fever, cough and chest pain at PAT appointment   All instructions explained to the patient, with a verbal understanding of the material. Patient agrees to go over the instructions while at home for a better understanding. Patient also instructed to wear a mask and stay away from anyone who is sick after being tested for COVID-19. The opportunity to ask questions was provided.

## 2022-02-07 NOTE — Pre-Procedure Instructions (Signed)
Surgical Instructions    Your procedure is scheduled on Tuesday, February 11, 2022.  Report to [facility] Main Entrance "A" at 5:30  A.M., then check in with the Admitting office.  Call this number if you have problems the morning of surgery:  [phone]   If you have any questions prior to your surgery date call [phone]: Open Monday-Friday 8am-4pm If you experience any cold or flu symptoms such as cough, fever, chills, shortness of breath, etc. between now and your scheduled surgery, please notify us at the above number     Remember:  Do not eat or drink after midnight the night before your surgery     Take these medicines the morning of surgery with A SIP OF WATER:   You may take usual cardiac medications (calcium-channel blockers, beta blockers, digoxin, or nitrates) the morning of surgery with a sip of water (no other liquids).  If needed use  albuterol (VENTOLIN HFA)  And bring this with you to the hospital.  As of today, STOP taking any Aspirin (unless otherwise instructed by your surgeon) Aleve, Naproxen, Ibuprofen, Motrin, Advil, Goody's, BC's, all herbal medications, fish oil, and all vitamins.          Do not wear jewelry or makeup. Do not wear lotions, powders, perfumes or deodorant. Do not shave 48 hours prior to surgery.   Do not bring valuables to the hospital. Do not wear nail polish, gel polish, artificial nails, or any other type of covering on natural nails (fingers and toes) If you have artificial nails or gel coating that need to be removed by a nail salon, please have this removed prior to surgery. Artificial nails or gel coating may interfere with anesthesia's ability to adequately monitor your vital signs.  [place] is not responsible for any belongings or valuables.    Do NOT Smoke (Tobacco/Vaping)  24 hours prior to your procedure  If you use a CPAP at night, you may bring your mask for your overnight stay.   Contacts, glasses, hearing aids,  dentures or partials may not be worn into surgery, please bring cases for these belongings   For patients admitted to the hospital, discharge time will be determined by your treatment team.   Patients discharged the day of surgery will not be allowed to drive home, and someone needs to stay with them for 24 hours.   SURGICAL WAITING ROOM VISITATION Patients having surgery or a procedure may have no more than 2 support people in the waiting area - these visitors may rotate.   Children under the age of 32 must have an adult with them who is not the patient. If the patient needs to stay at the hospital during part of their recovery, the visitor guidelines for inpatient rooms apply. Pre-op nurse will coordinate an appropriate time for 1 support person to accompany patient in pre-op.  This support person may not rotate.   Please refer to the [facility] website for the visitor guidelines for Inpatients (after your surgery is over and you are in a regular room).    Special instructions:    Oral Hygiene is also important to reduce your risk of infection.  Remember - BRUSH YOUR TEETH THE MORNING OF SURGERY WITH YOUR REGULAR TOOTHPASTE   [facility]- Preparing For Surgery  Before surgery, you can play an important role. Because skin is not sterile, your skin needs to be as free of germs as possible. You can reduce the number of germs on your  skin by washing with CHG (chlorahexidine gluconate) Soap before surgery.  CHG is an antiseptic cleaner which kills germs and bonds with the skin to continue killing germs even after washing.     Please do not use if you have an allergy to CHG or antibacterial soaps. If your skin becomes reddened/irritated stop using the CHG.  Do not shave (including legs and underarms) for at least 48 hours prior to first CHG shower. It is OK to shave your face.  Please follow these instructions carefully.     Shower the NIGHT BEFORE SURGERY and the MORNING OF SURGERY with  CHG Soap.   If you chose to wash your hair, wash your hair first as usual with your normal shampoo. After you shampoo, rinse your hair and body thoroughly to remove the shampoo.  Then [employer] and genitals (private parts) with your normal soap and rinse thoroughly to remove soap.  After that Use CHG Soap as you would any other liquid soap. You can apply CHG directly to the skin and wash gently with a scrungie or a clean washcloth.   Apply the CHG Soap to your body ONLY FROM THE NECK DOWN.  Do not use on open wounds or open sores. Avoid contact with your eyes, ears, mouth and genitals (private parts). Wash Face and genitals (private parts)  with your normal soap.   Wash thoroughly, paying special attention to the area where your surgery will be performed.  Thoroughly rinse your body with warm water from the neck down.  DO NOT shower/wash with your normal soap after using and rinsing off the CHG Soap.  Pat yourself dry with a CLEAN TOWEL.  Wear CLEAN PAJAMAS to bed the night before surgery  Place CLEAN SHEETS on your bed the night before your surgery  DO NOT SLEEP WITH PETS.   Day of Surgery:  Take a shower with CHG soap. Wear Clean/Comfortable clothing the morning of surgery Do not apply any deodorants/lotions.   Remember to brush your teeth WITH YOUR REGULAR TOOTHPASTE.    If you received a COVID test during your pre-op visit, it is requested that you wear a mask when out in public, stay away from anyone that may not be feeling well, and notify your surgeon if you develop symptoms. If you have been in contact with anyone that has tested positive in the last 10 days, please notify your surgeon.    Please read over the following fact sheets that you were given.

## 2022-02-07 NOTE — Progress Notes (Signed)
Made Dr.Welder aware of pt's u/a results.

## 2022-02-08 LAB — SARS CORONAVIRUS 2 (TAT 6-24 HRS): SARS Coronavirus 2: NEGATIVE

## 2022-02-10 ENCOUNTER — Other Ambulatory Visit (HOSPITAL_COMMUNITY): Payer: Self-pay | Admitting: Neurosurgery

## 2022-02-10 DIAGNOSIS — M5412 Radiculopathy, cervical region: Secondary | ICD-10-CM

## 2022-02-10 MED ORDER — TRANEXAMIC ACID 1000 MG/10ML IV SOLN
1.5000 mg/kg/h | INTRAVENOUS | Status: AC
  Administered 2022-02-11: 1.5 mg/kg/h via INTRAVENOUS
  Filled 2022-02-10: qty 25

## 2022-02-10 MED ORDER — MILRINONE LACTATE IN DEXTROSE 20-5 MG/100ML-% IV SOLN
0.3000 ug/kg/min | INTRAVENOUS | Status: AC
  Administered 2022-02-11: .375 ug/kg/min via INTRAVENOUS
  Filled 2022-02-10: qty 100

## 2022-02-10 MED ORDER — EPINEPHRINE HCL 5 MG/250ML IV SOLN IN NS
0.0000 ug/min | INTRAVENOUS | Status: AC
  Administered 2022-02-11: 2 ug/min via INTRAVENOUS
  Filled 2022-02-10: qty 250

## 2022-02-10 MED ORDER — PHENYLEPHRINE HCL-NACL 20-0.9 MG/250ML-% IV SOLN
30.0000 ug/min | INTRAVENOUS | Status: AC
  Administered 2022-02-11: 20 ug/min via INTRAVENOUS
  Administered 2022-02-11: 15 ug/min via INTRAVENOUS
  Filled 2022-02-10: qty 250

## 2022-02-10 MED ORDER — VANCOMYCIN HCL 1250 MG/250ML IV SOLN
1250.0000 mg | INTRAVENOUS | Status: AC
  Administered 2022-02-11: 1250 mg via INTRAVENOUS
  Filled 2022-02-10: qty 250

## 2022-02-10 MED ORDER — MANNITOL 20 % IV SOLN
INTRAVENOUS | Status: DC
  Filled 2022-02-10 (×2): qty 13

## 2022-02-10 MED ORDER — INSULIN REGULAR(HUMAN) IN NACL 100-0.9 UT/100ML-% IV SOLN
INTRAVENOUS | Status: AC
  Administered 2022-02-11: 1 [IU]/h via INTRAVENOUS
  Filled 2022-02-10: qty 100

## 2022-02-10 MED ORDER — HEPARIN 30,000 UNITS/1000 ML (OHS) CELLSAVER SOLUTION
Status: DC
  Filled 2022-02-10: qty 1000

## 2022-02-10 MED ORDER — NOREPINEPHRINE 4 MG/250ML-% IV SOLN
0.0000 ug/min | INTRAVENOUS | Status: DC
  Filled 2022-02-10: qty 250

## 2022-02-10 MED ORDER — DEXMEDETOMIDINE HCL IN NACL 400 MCG/100ML IV SOLN
0.1000 ug/kg/h | INTRAVENOUS | Status: AC
  Administered 2022-02-11: .4 ug/kg/h via INTRAVENOUS
  Filled 2022-02-10: qty 100

## 2022-02-10 MED ORDER — POTASSIUM CHLORIDE 2 MEQ/ML IV SOLN
80.0000 meq | INTRAVENOUS | Status: DC
  Filled 2022-02-10: qty 40

## 2022-02-10 MED ORDER — PLASMA-LYTE A IV SOLN
INTRAVENOUS | Status: DC
  Filled 2022-02-10: qty 2.5

## 2022-02-10 MED ORDER — CEFAZOLIN SODIUM-DEXTROSE 2-4 GM/100ML-% IV SOLN
2.0000 g | INTRAVENOUS | Status: AC
  Administered 2022-02-11: 2 g via INTRAVENOUS
  Filled 2022-02-10: qty 100

## 2022-02-10 MED ORDER — TRANEXAMIC ACID (OHS) BOLUS VIA INFUSION
15.0000 mg/kg | INTRAVENOUS | Status: AC
  Administered 2022-02-11: 802.5 mg via INTRAVENOUS
  Filled 2022-02-10: qty 803

## 2022-02-10 MED ORDER — TRANEXAMIC ACID (OHS) PUMP PRIME SOLUTION
2.0000 mg/kg | INTRAVENOUS | Status: DC
  Filled 2022-02-10: qty 1.07

## 2022-02-10 MED ORDER — VANCOMYCIN HCL 1000 MG IV SOLR
INTRAVENOUS | Status: DC
  Filled 2022-02-10: qty 20

## 2022-02-10 NOTE — H&P (Signed)
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 Pt had an uneventful post operative recovery except for developing hives on the lower extremeties due to coumadin which resolved after it was discontinued. Pt has been doing well up to the past year when she developed COVID and from that point describes an acute change in her ability to walk distances as before needing to stop due to DOE. She has good days and bad days with the bad days needing to stop and rest after a flight of stairs or over 50 to 100 yrds. She has some palpitations but no lower ext edema. She has no CP. She had an echo 2/23 with now new severe MR and MS. This has been confirmed again with repeat echo this August. She has been followed but has felt that she should be considering reoperation and to that end was taken to the cath lab where PA pressures were moderately elevated with SPAP 63mHg. She had no evidence of CAD. Pt now presents for discussion of options moving forward.        Past Medical History:  Diagnosis Date   [place]14[place]  [place] systolic heart failure [place] 09/[place]   Adenomatous colon polyp     Arthritis     Back pain     Chronic diastolic heart failure (HCC)      related to MR/MS   Chronic pain syndrome     Chronic sinusitis     COPD (chronic obstructive pulmonary disease)-PFT pending  07/17/2014   Diverticulosis      Fibromyalgia     GERD (gastroesophageal reflux disease)     Headaches, cluster     Hiatal hernia     Hx of adenomatous colonic polyps 03/10/2011    Oct 2012, repeat colon Oct 2017    Internal hemorrhoids     Irritable bowel syndrome (IBS)     Mitral valve regurgitation      severe   Mitral valve stenosis, moderate 07/11/2014   Protein[place]calorie malnutrition, severe ([place] 07/06/2014   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/10/2014    Severe mitral regurgitation with moderate mitral stenosis   S/P minimally invasive mitral valve replacement with bioprosthetic valve 11/08/2014    [facility] via right mini thoracotomy approach   Tobacco abuse             Past Surgical History:  Procedure Laterality Date   CYSTOSTOMY W/ BLADDER DILATION        at age 26   LEFT AND RIGHT HEART CATHETERIZATION WITH CORONARY ANGIOGRAM N/A 07/27/2014    Procedure: LEFT AND RIGHT HEART CATHETERIZATION WITH CORONARY ANGIOGRAM;  Surgeon: Burnell Blanks, MD;  Location: [facility] CATH LAB;  Right left heart catheterization: Mild/minimal coronary artery disease. RV: 50/7/17, PA: 48/29 (mean 38), PCWP 33 mmHg   MITRAL VALVE REPAIR Right 11/08/2014    Procedure: MINIMALLY INVASIVE MITRAL VALVE REPLACEMENT(MVR);  Surgeon: Rexene Alberts, MD;  Location: [place];  Service: Open Heart Surgery;  Laterality: Right;   NEUROPLASTY / TRANSPOSITION MEDIAN NERVE AT CARPAL TUNNEL BILATERAL       RIGHT/LEFT HEART CATH AND CORONARY ANGIOGRAPHY N/A 12/26/2021    Procedure: RIGHT/LEFT HEART CATH AND CORONARY ANGIOGRAPHY;  Surgeon: Sherren Mocha, MD;  Location: [place] CV LAB;  Service: Cardiovascular;  Laterality: N/A;   TEE WITHOUT CARDIOVERSION N/A 07/10/2014    Procedure: TRANSESOPHAGEAL ECHOCARDIOGRAM (TEE);  Surgeon: Lelon Perla, MD;  Location: [facility] ENDOSCOPY;  Service: Cardiovascular;  55-60%. No regional wall motion modalities. Moderate mitral stenosis with severe  regurgitation and moderate to severely dilated left atrium.   TEE WITHOUT CARDIOVERSION N/A 11/08/2014    Procedure: TRANSESOPHAGEAL ECHOCARDIOGRAM (TEE);  Surgeon: Rexene Alberts, MD;  Location: [place];  Service: Open Heart Surgery;  Laterality: N/A;   TEE WITHOUT CARDIOVERSION N/A 06/07/2021    Procedure: TRANSESOPHAGEAL ECHOCARDIOGRAM (TEE);  Surgeon: Sueanne Margarita, MD;  Location: [facility] ENDOSCOPY;  Service: Cardiovascular;  Laterality: N/A;   THORACIC OUTLET SURGERY       TRANSTHORACIC ECHOCARDIOGRAM   07/04/2014    Normal LV Size/Fnx - EF 60-65% no RWMA; MV: thickened leaflets with doming, fixed Posterior leaflet, anterior leaflet w/ large/shaggy & mobile density.  c/w Rheumatic Mitral disease - moderate MS & Mod-Severe MR.   TUBAL LIGATION       VULVA SURGERY               Family History  Problem Relation Age of Onset   Kidney cancer Mother     Colon polyps Mother     Irritable bowel syndrome Mother     Diabetes Sister     Liver disease Sister     Irritable bowel syndrome Daughter     Breast cancer Paternal Aunt     Diabetes Brother     Colon cancer Neg Hx     Thyroid disease Neg Hx        Social History Social History         Tobacco Use   Smoking status: Former      Packs/day: 0.50      Years: 35.00      Total pack years: 17.50      Types: Cigarettes      Quit date: 06/17/2014      Years since quitting: 7.5   Smokeless tobacco: Never  Substance Use Topics   Alcohol use: No      Alcohol/week: 0.0 standard drinks of alcohol      Comment: rare   Drug use: No            Current Outpatient Medications  Medication Sig Dispense Refill   albuterol (VENTOLIN HFA) 108 (90 Base) MCG/ACT inhaler Inhale 2 puffs into the lungs every 6 (  six) hours as needed. 18 g 5   clobetasol (TEMOVATE) 0.05 % external solution Apply 1 application  topically daily as needed (scalp irritation.).       estradiol (ESTRACE) 0.1 MG/GM vaginal cream 1 gram vaginally twice weekly (Patient taking  differently: Place 1 Applicatorful vaginally daily as needed (vaginal swelling).) 42.5 g 6   fluticasone (FLONASE) 50 MCG/ACT nasal spray Place 2 sprays into both nostrils at bedtime.       ketoconazole (NIZORAL) 2 % shampoo Apply 1 Application topically daily as needed (scalp irritation.).       liothyronine (CYTOMEL) 5 MCG tablet Take 5 mcg by mouth daily.    2   metroNIDAZOLE (METROCREAM) 0.75 % cream Apply 1 application  topically in the morning. Face       SYNTHROID 75 MCG tablet Take 75 mcg by mouth daily before breakfast.        terconazole (TERAZOL 3) 0.8 % vaginal cream Place 1 applicator vaginally at bedtime. Use for 3 days. 20 g 1   aspirin EC 81 MG tablet Take 1 tablet (81 mg total) by mouth daily. Swallow whole. (Patient not taking: Reported on 01/02/2022) 30 tablet 12   furosemide (LASIX) 20 MG tablet Take 1 tablet (20 mg total) by mouth daily. 90 tablet 3    No current facility-administered medications for this visit.           Allergies  Allergen Reactions   Estrace [Estradiol] Hives   Reglan [Metoclopramide] Hives   Warfarin And Related Hives and Rash   Other Hives and Other (See Comments)      Any antidepressants per pt- causes altered mental state, anger Other reaction(s): rash   Morphine And Related Other (See Comments)      Altered mental state-Patient reports she "made satan look sweet"      Singulair [Montelukast Sodium] Other (See Comments)      Intolerance - effects her mood    Statins Other (See Comments)      Myalgias with several statins        BP (!) 160/76   Pulse 90   Resp 20   Ht 5' (1.524 m)   Wt 116 lb (52.6 kg)   SpO2 95% Comment: RA  BMI 22.65 kg/m  Physical Exam: Directed physical exam Head and neck: teeth in good repair Lungs: clear Heart: RR with 2/6 sem to axilla Ext: warm and no edema   Diagnostic Tests: Cardiac Cath 8/23 Conclusion   1.  Patent coronary arteries with minimal irregularity, codominant coronary distribution 2.   Large V waves of 41 mmHg consistent with severe mitral regurgitation 3.  Mean transmitral gradient of 27 mmHg, calculated mitral valve area of 0.8 cm.  Gradients are falsely elevated from large V waves related to severe MR. 4.  Moderate pulmonary hypertension with mean PA pressure 41 mmHg, transpulmonary gradient 8 mmHg, PVR less than 2 Wood units   Recommend: Surgical referral to evaluate treatment options for mitral valve replacement   ECHO 89/23  IMPRESSIONS 25 mm bioprosthesis. MG 14 @ 78 bpm. Emax 2.5 m/s, EOA 1.14 cm2, DVI 2.6, PHT 72 ms. All suggest severe regurgitation. Based on prior TEE, MR is severe. All parameters point to severe MR to explain elevated gradient. The mitral valve has been repaired/replaced. Severe mitral valve regurgitation. There is a 25 mm Edwards Magna bioprosthetic valve present in the mitral position. Procedure Date: 11/08/2014. 1. Left ventricular ejection fraction, by estimation, is 65 to 70%. The left ventricle  has normal function. The left ventricle has no regional wall motion abnormalities. Left ventricular diastolic function could not be evaluated. 2. Right ventricular systolic function is normal. The right ventricular size is normal. Tricuspid regurgitation signal is inadequate for assessing PA pressure. 3. 4. Left atrial size was mild to moderately dilated. The aortic valve is tricuspid. Aortic valve regurgitation is not visualized. No aortic stenosis is present. 5. The inferior vena cava is normal in size with greater than 50% respiratory variability, suggesting right atrial pressure of 3 mmHg. 6. Comparison(s): No significant change from prior study. MR remains severe.   Impression: Prosthetic Mitral Valve deterioration with severe MR and MS    Pt has NYHA class 2 symptoms and severe prosthetic valve MR and MS. She has a class I indication for re-replacement . As this is her second upcomming cardiac procedure, mechanical prosthesis at her age  would be best option however with her documented allergy to coumadin, this is not possible. She would best be served with repeat MVR with the tissue prosthesis Mitris with reselia treatment and hopefully will obtain longer longevity over the previous model. Pt and friend understand these issues and agree. Long term management when this valve has need for re-replacement would most likely be transcatheter options. I have discussed the anticipated treatment of redo MVR via sternotomy this time and will obtain non contrast CT to finalize workup. All the risks and goals and anticipated recovery were discussed with pt and she agrees. We will schedule for early October but if pt feels she is doing well at that time, she may defer to after christmas holidays. She knows to monitor her symptoms closely and to notify us if she worsens. 65 min were required to review chart and personally visualize studies and to discuss face to face with pt and to document.    COPD  Currently well controlled on medication. Had prolonged ventilator needs after pneumonia in 2017 but did ok after vavle surgery. Will monitor closely after surgery.   Plan: Redo MVR with resilia treated tissue prosthesis (Mitris) via sternotomy. Non contrast CT

## 2022-02-11 ENCOUNTER — Other Ambulatory Visit: Payer: Self-pay

## 2022-02-11 ENCOUNTER — Encounter (HOSPITAL_COMMUNITY)
Admission: RE | Disposition: A | Discharge status: Home / Self Care | Payer: Self-pay | Source: Ambulatory Visit | Attending: Thoracic Surgery (Cardiothoracic Vascular Surgery)

## 2022-02-11 ENCOUNTER — Encounter (HOSPITAL_COMMUNITY): Payer: Self-pay | Admitting: Thoracic Surgery (Cardiothoracic Vascular Surgery)

## 2022-02-11 ENCOUNTER — Inpatient Hospital Stay (HOSPITAL_COMMUNITY)
Admission: RE | Admit: 2022-02-11 | Discharge: 2022-02-21 | DRG: 219 | Disposition: A | Discharge status: Home Health | Payer: Medicare Other | Attending: Thoracic Surgery (Cardiothoracic Vascular Surgery) | Admitting: Thoracic Surgery (Cardiothoracic Vascular Surgery)

## 2022-02-11 ENCOUNTER — Inpatient Hospital Stay (HOSPITAL_COMMUNITY): Payer: Medicare Other

## 2022-02-11 ENCOUNTER — Inpatient Hospital Stay (HOSPITAL_COMMUNITY): Payer: Medicare Other | Admitting: Certified Registered Nurse Anesthetist

## 2022-02-11 ENCOUNTER — Inpatient Hospital Stay (HOSPITAL_COMMUNITY): Payer: Medicare Other | Admitting: Physician Assistant

## 2022-02-11 DIAGNOSIS — E782 Mixed hyperlipidemia: Secondary | ICD-10-CM | POA: Diagnosis present

## 2022-02-11 DIAGNOSIS — J449 Chronic obstructive pulmonary disease, unspecified: Secondary | ICD-10-CM | POA: Diagnosis not present

## 2022-02-11 DIAGNOSIS — D696 Thrombocytopenia, unspecified: Secondary | ICD-10-CM | POA: Diagnosis not present

## 2022-02-11 DIAGNOSIS — I442 Atrioventricular block, complete: Secondary | ICD-10-CM | POA: Diagnosis not present

## 2022-02-11 DIAGNOSIS — Y712 Prosthetic and other implants, materials and accessory cardiovascular devices associated with adverse incidents: Secondary | ICD-10-CM | POA: Diagnosis present

## 2022-02-11 DIAGNOSIS — Z888 Allergy status to other drugs, medicaments and biological substances status: Secondary | ICD-10-CM

## 2022-02-11 DIAGNOSIS — M797 Fibromyalgia: Secondary | ICD-10-CM | POA: Diagnosis present

## 2022-02-11 DIAGNOSIS — T82857A Stenosis of cardiac prosthetic devices, implants and grafts, initial encounter: Principal | ICD-10-CM | POA: Diagnosis present

## 2022-02-11 DIAGNOSIS — Z79890 Hormone replacement therapy (postmenopausal): Secondary | ICD-10-CM | POA: Diagnosis not present

## 2022-02-11 DIAGNOSIS — Z8051 Family history of malignant neoplasm of kidney: Secondary | ICD-10-CM

## 2022-02-11 DIAGNOSIS — I340 Nonrheumatic mitral (valve) insufficiency: Secondary | ICD-10-CM | POA: Diagnosis not present

## 2022-02-11 DIAGNOSIS — I5033 Acute on chronic diastolic (congestive) heart failure: Secondary | ICD-10-CM | POA: Diagnosis not present

## 2022-02-11 DIAGNOSIS — Z79899 Other long term (current) drug therapy: Secondary | ICD-10-CM

## 2022-02-11 DIAGNOSIS — Z8616 Personal history of COVID-19: Secondary | ICD-10-CM | POA: Diagnosis not present

## 2022-02-11 DIAGNOSIS — D62 Acute posthemorrhagic anemia: Secondary | ICD-10-CM | POA: Diagnosis not present

## 2022-02-11 DIAGNOSIS — Y92239 Unspecified place in hospital as the place of occurrence of the external cause: Secondary | ICD-10-CM | POA: Diagnosis not present

## 2022-02-11 DIAGNOSIS — Z86010 Personal history of colonic polyps: Secondary | ICD-10-CM | POA: Diagnosis not present

## 2022-02-11 DIAGNOSIS — R410 Disorientation, unspecified: Secondary | ICD-10-CM | POA: Diagnosis not present

## 2022-02-11 DIAGNOSIS — K5900 Constipation, unspecified: Secondary | ICD-10-CM | POA: Diagnosis not present

## 2022-02-11 DIAGNOSIS — Z83719 Family history of colon polyps, unspecified: Secondary | ICD-10-CM | POA: Diagnosis not present

## 2022-02-11 DIAGNOSIS — Z87891 Personal history of nicotine dependence: Secondary | ICD-10-CM | POA: Diagnosis not present

## 2022-02-11 DIAGNOSIS — I4891 Unspecified atrial fibrillation: Secondary | ICD-10-CM | POA: Diagnosis not present

## 2022-02-11 DIAGNOSIS — Z803 Family history of malignant neoplasm of breast: Secondary | ICD-10-CM

## 2022-02-11 DIAGNOSIS — J9600 Acute respiratory failure, unspecified whether with hypoxia or hypercapnia: Secondary | ICD-10-CM | POA: Diagnosis not present

## 2022-02-11 DIAGNOSIS — J439 Emphysema, unspecified: Secondary | ICD-10-CM | POA: Diagnosis not present

## 2022-02-11 DIAGNOSIS — G894 Chronic pain syndrome: Secondary | ICD-10-CM | POA: Diagnosis present

## 2022-02-11 DIAGNOSIS — J9811 Atelectasis: Secondary | ICD-10-CM | POA: Diagnosis not present

## 2022-02-11 DIAGNOSIS — Z5320 Procedure and treatment not carried out because of patient's decision for unspecified reasons: Secondary | ICD-10-CM | POA: Diagnosis not present

## 2022-02-11 DIAGNOSIS — E039 Hypothyroidism, unspecified: Secondary | ICD-10-CM | POA: Diagnosis present

## 2022-02-11 DIAGNOSIS — J9601 Acute respiratory failure with hypoxia: Secondary | ICD-10-CM | POA: Diagnosis not present

## 2022-02-11 DIAGNOSIS — R339 Retention of urine, unspecified: Secondary | ICD-10-CM | POA: Diagnosis not present

## 2022-02-11 DIAGNOSIS — D72829 Elevated white blood cell count, unspecified: Secondary | ICD-10-CM | POA: Diagnosis not present

## 2022-02-11 DIAGNOSIS — Z885 Allergy status to narcotic agent status: Secondary | ICD-10-CM

## 2022-02-11 DIAGNOSIS — K219 Gastro-esophageal reflux disease without esophagitis: Secondary | ICD-10-CM | POA: Diagnosis present

## 2022-02-11 DIAGNOSIS — E871 Hypo-osmolality and hyponatremia: Secondary | ICD-10-CM | POA: Diagnosis not present

## 2022-02-11 DIAGNOSIS — I4892 Unspecified atrial flutter: Secondary | ICD-10-CM | POA: Diagnosis not present

## 2022-02-11 DIAGNOSIS — I2510 Atherosclerotic heart disease of native coronary artery without angina pectoris: Secondary | ICD-10-CM

## 2022-02-11 DIAGNOSIS — R570 Cardiogenic shock: Secondary | ICD-10-CM | POA: Diagnosis not present

## 2022-02-11 DIAGNOSIS — G4733 Obstructive sleep apnea (adult) (pediatric): Secondary | ICD-10-CM | POA: Diagnosis present

## 2022-02-11 DIAGNOSIS — Z781 Physical restraint status: Secondary | ICD-10-CM

## 2022-02-11 DIAGNOSIS — Z952 Presence of prosthetic heart valve: Secondary | ICD-10-CM | POA: Diagnosis not present

## 2022-02-11 DIAGNOSIS — T502X5A Adverse effect of carbonic-anhydrase inhibitors, benzothiadiazides and other diuretics, initial encounter: Secondary | ICD-10-CM | POA: Diagnosis not present

## 2022-02-11 DIAGNOSIS — J90 Pleural effusion, not elsewhere classified: Secondary | ICD-10-CM | POA: Diagnosis not present

## 2022-02-11 DIAGNOSIS — Z833 Family history of diabetes mellitus: Secondary | ICD-10-CM

## 2022-02-11 DIAGNOSIS — R001 Bradycardia, unspecified: Secondary | ICD-10-CM | POA: Diagnosis not present

## 2022-02-11 DIAGNOSIS — M7989 Other specified soft tissue disorders: Secondary | ICD-10-CM | POA: Diagnosis not present

## 2022-02-11 DIAGNOSIS — J4489 Other specified chronic obstructive pulmonary disease: Secondary | ICD-10-CM | POA: Diagnosis not present

## 2022-02-11 HISTORY — PX: TEE WITHOUT CARDIOVERSION: SHX5443

## 2022-02-11 HISTORY — PX: MITRAL VALVE REPLACEMENT: SHX147

## 2022-02-11 LAB — POCT I-STAT 7, (LYTES, BLD GAS, ICA,H+H)
Acid-Base Excess: 2 mmol/L (ref 0.0–2.0)
Acid-Base Excess: 4 mmol/L — ABNORMAL HIGH (ref 0.0–2.0)
Acid-Base Excess: 3 mmol/L — ABNORMAL HIGH (ref 0.0–2.0)
Acid-Base Excess: 1 mmol/L (ref 0.0–2.0)
Acid-Base Excess: 3 mmol/L — ABNORMAL HIGH (ref 0.0–2.0)
Acid-Base Excess: 0 mmol/L (ref 0.0–2.0)
Acid-base deficit: 6 mmol/L — ABNORMAL HIGH (ref 0.0–2.0)
Acid-base deficit: 4 mmol/L — ABNORMAL HIGH (ref 0.0–2.0)
Acid-base deficit: 6 mmol/L — ABNORMAL HIGH (ref 0.0–2.0)
Acid-base deficit: 5 mmol/L — ABNORMAL HIGH (ref 0.0–2.0)
Bicarbonate: 26.5 mmol/L (ref 20.0–28.0)
Bicarbonate: 27.7 mmol/L (ref 20.0–28.0)
Bicarbonate: 27.7 mmol/L (ref 20.0–28.0)
Bicarbonate: 26.1 mmol/L (ref 20.0–28.0)
Bicarbonate: 26.5 mmol/L (ref 20.0–28.0)
Bicarbonate: 24.6 mmol/L (ref 20.0–28.0)
Bicarbonate: 20.6 mmol/L (ref 20.0–28.0)
Bicarbonate: 21.6 mmol/L (ref 20.0–28.0)
Bicarbonate: 20.7 mmol/L (ref 20.0–28.0)
Bicarbonate: 20.7 mmol/L (ref 20.0–28.0)
Calcium, Ion: 1.18 mmol/L (ref 1.15–1.40)
Calcium, Ion: 0.93 mmol/L — ABNORMAL LOW (ref 1.15–1.40)
Calcium, Ion: 0.95 mmol/L — ABNORMAL LOW (ref 1.15–1.40)
Calcium, Ion: 0.84 mmol/L — CL (ref 1.15–1.40)
Calcium, Ion: 0.89 mmol/L — CL (ref 1.15–1.40)
Calcium, Ion: 0.92 mmol/L — ABNORMAL LOW (ref 1.15–1.40)
Calcium, Ion: 0.98 mmol/L — ABNORMAL LOW (ref 1.15–1.40)
Calcium, Ion: 1.01 mmol/L — ABNORMAL LOW (ref 1.15–1.40)
Calcium, Ion: 1.03 mmol/L — ABNORMAL LOW (ref 1.15–1.40)
Calcium, Ion: 1.06 mmol/L — ABNORMAL LOW (ref 1.15–1.40)
HCT: 31 % — ABNORMAL LOW (ref 36.0–46.0)
HCT: 21 % — ABNORMAL LOW (ref 36.0–46.0)
HCT: 20 % — ABNORMAL LOW (ref 36.0–46.0)
HCT: 20 % — ABNORMAL LOW (ref 36.0–46.0)
HCT: 21 % — ABNORMAL LOW (ref 36.0–46.0)
HCT: 23 % — ABNORMAL LOW (ref 36.0–46.0)
HCT: 26 % — ABNORMAL LOW (ref 36.0–46.0)
HCT: 23 % — ABNORMAL LOW (ref 36.0–46.0)
HCT: 23 % — ABNORMAL LOW (ref 36.0–46.0)
HCT: 22 % — ABNORMAL LOW (ref 36.0–46.0)
Hemoglobin: 10.5 g/dL — ABNORMAL LOW (ref 12.0–15.0)
Hemoglobin: 7.1 g/dL — ABNORMAL LOW (ref 12.0–15.0)
Hemoglobin: 6.8 g/dL — CL (ref 12.0–15.0)
Hemoglobin: 6.8 g/dL — CL (ref 12.0–15.0)
Hemoglobin: 7.1 g/dL — ABNORMAL LOW (ref 12.0–15.0)
Hemoglobin: 7.8 g/dL — ABNORMAL LOW (ref 12.0–15.0)
Hemoglobin: 8.8 g/dL — ABNORMAL LOW (ref 12.0–15.0)
Hemoglobin: 7.8 g/dL — ABNORMAL LOW (ref 12.0–15.0)
Hemoglobin: 7.8 g/dL — ABNORMAL LOW (ref 12.0–15.0)
Hemoglobin: 7.5 g/dL — ABNORMAL LOW (ref 12.0–15.0)
O2 Saturation: 100 %
O2 Saturation: 100 %
O2 Saturation: 100 %
O2 Saturation: 100 %
O2 Saturation: 100 %
O2 Saturation: 100 %
O2 Saturation: 92 %
O2 Saturation: 98 %
O2 Saturation: 99 %
O2 Saturation: 98 %
Patient temperature: 36
Patient temperature: 35.6
Patient temperature: 35.7
Potassium: 3.4 mmol/L — ABNORMAL LOW (ref 3.5–5.1)
Potassium: 3.9 mmol/L (ref 3.5–5.1)
Potassium: 4.5 mmol/L (ref 3.5–5.1)
Potassium: 5.6 mmol/L — ABNORMAL HIGH (ref 3.5–5.1)
Potassium: 4.5 mmol/L (ref 3.5–5.1)
Potassium: 4 mmol/L (ref 3.5–5.1)
Potassium: 3.6 mmol/L (ref 3.5–5.1)
Potassium: 4 mmol/L (ref 3.5–5.1)
Potassium: 4.2 mmol/L (ref 3.5–5.1)
Potassium: 4 mmol/L (ref 3.5–5.1)
Sodium: 138 mmol/L (ref 135–145)
Sodium: 141 mmol/L (ref 135–145)
Sodium: 139 mmol/L (ref 135–145)
Sodium: 138 mmol/L (ref 135–145)
Sodium: 140 mmol/L (ref 135–145)
Sodium: 140 mmol/L (ref 135–145)
Sodium: 142 mmol/L (ref 135–145)
Sodium: 141 mmol/L (ref 135–145)
Sodium: 141 mmol/L (ref 135–145)
Sodium: 141 mmol/L (ref 135–145)
TCO2: 28 mmol/L (ref 22–32)
TCO2: 29 mmol/L (ref 22–32)
TCO2: 29 mmol/L (ref 22–32)
TCO2: 28 mmol/L (ref 22–32)
TCO2: 28 mmol/L (ref 22–32)
TCO2: 26 mmol/L (ref 22–32)
TCO2: 22 mmol/L (ref 22–32)
TCO2: 23 mmol/L (ref 22–32)
TCO2: 22 mmol/L (ref 22–32)
TCO2: 22 mmol/L (ref 22–32)
pCO2 arterial: 40 mmHg (ref 32–48)
pCO2 arterial: 37.3 mmHg (ref 32–48)
pCO2 arterial: 42.9 mmHg (ref 32–48)
pCO2 arterial: 46 mmHg (ref 32–48)
pCO2 arterial: 35.4 mmHg (ref 32–48)
pCO2 arterial: 36.7 mmHg (ref 32–48)
pCO2 arterial: 40.6 mmHg (ref 32–48)
pCO2 arterial: 43.3 mmHg (ref 32–48)
pCO2 arterial: 42.1 mmHg (ref 32–48)
pCO2 arterial: 39.8 mmHg (ref 32–48)
pH, Arterial: 7.429 (ref 7.35–7.45)
pH, Arterial: 7.478 — ABNORMAL HIGH (ref 7.35–7.45)
pH, Arterial: 7.417 (ref 7.35–7.45)
pH, Arterial: 7.363 (ref 7.35–7.45)
pH, Arterial: 7.483 — ABNORMAL HIGH (ref 7.35–7.45)
pH, Arterial: 7.435 (ref 7.35–7.45)
pH, Arterial: 7.308 — ABNORMAL LOW (ref 7.35–7.45)
pH, Arterial: 7.305 — ABNORMAL LOW (ref 7.35–7.45)
pH, Arterial: 7.293 — ABNORMAL LOW (ref 7.35–7.45)
pH, Arterial: 7.319 — ABNORMAL LOW (ref 7.35–7.45)
pO2, Arterial: 272 mmHg — ABNORMAL HIGH (ref 83–108)
pO2, Arterial: 462 mmHg — ABNORMAL HIGH (ref 83–108)
pO2, Arterial: 370 mmHg — ABNORMAL HIGH (ref 83–108)
pO2, Arterial: 442 mmHg — ABNORMAL HIGH (ref 83–108)
pO2, Arterial: 444 mmHg — ABNORMAL HIGH (ref 83–108)
pO2, Arterial: 370 mmHg — ABNORMAL HIGH (ref 83–108)
pO2, Arterial: 67 mmHg — ABNORMAL LOW (ref 83–108)
pO2, Arterial: 111 mmHg — ABNORMAL HIGH (ref 83–108)
pO2, Arterial: 143 mmHg — ABNORMAL HIGH (ref 83–108)
pO2, Arterial: 114 mmHg — ABNORMAL HIGH (ref 83–108)

## 2022-02-11 LAB — CBC
HCT: 28.1 % — ABNORMAL LOW (ref 36.0–46.0)
HCT: 25.5 % — ABNORMAL LOW (ref 36.0–46.0)
Hemoglobin: 9.3 g/dL — ABNORMAL LOW (ref 12.0–15.0)
Hemoglobin: 8.9 g/dL — ABNORMAL LOW (ref 12.0–15.0)
MCH: 28.5 pg (ref 26.0–34.0)
MCH: 29.3 pg (ref 26.0–34.0)
MCHC: 33.1 g/dL (ref 30.0–36.0)
MCHC: 34.9 g/dL (ref 30.0–36.0)
MCV: 86.2 fL (ref 80.0–100.0)
MCV: 83.9 fL (ref 80.0–100.0)
Platelets: 130 10*3/uL — ABNORMAL LOW (ref 150–400)
Platelets: 125 10*3/uL — ABNORMAL LOW (ref 150–400)
RBC: 3.26 MIL/uL — ABNORMAL LOW (ref 3.87–5.11)
RBC: 3.04 MIL/uL — ABNORMAL LOW (ref 3.87–5.11)
RDW: 14.6 % (ref 11.5–15.5)
RDW: 14.8 % (ref 11.5–15.5)
WBC: 14.6 10*3/uL — ABNORMAL HIGH (ref 4.0–10.5)
WBC: 11.5 10*3/uL — ABNORMAL HIGH (ref 4.0–10.5)
nRBC: 0 % (ref 0.0–0.2)
nRBC: 0 % (ref 0.0–0.2)

## 2022-02-11 LAB — COOXEMETRY PANEL
Carboxyhemoglobin: 1.6 % — ABNORMAL HIGH (ref 0.5–1.5)
Carboxyhemoglobin: 1.6 % — ABNORMAL HIGH (ref 0.5–1.5)
Methemoglobin: <0.7 % (ref 0.0–1.5)
Methemoglobin: 1 % (ref 0.0–1.5)
O2 Saturation: 74.5 %
O2 Saturation: 66.7 %
Total hemoglobin: 9.4 g/dL — ABNORMAL LOW (ref 12.0–16.0)
Total hemoglobin: 8.3 g/dL — ABNORMAL LOW (ref 12.0–16.0)

## 2022-02-11 LAB — POCT I-STAT, CHEM 8
BUN: 9 mg/dL (ref 8–23)
BUN: 8 mg/dL (ref 8–23)
BUN: 7 mg/dL — ABNORMAL LOW (ref 8–23)
BUN: 6 mg/dL — ABNORMAL LOW (ref 8–23)
BUN: 8 mg/dL (ref 8–23)
BUN: 6 mg/dL — ABNORMAL LOW (ref 8–23)
BUN: 7 mg/dL — ABNORMAL LOW (ref 8–23)
Calcium, Ion: 1.16 mmol/L (ref 1.15–1.40)
Calcium, Ion: 1.17 mmol/L (ref 1.15–1.40)
Calcium, Ion: 0.94 mmol/L — ABNORMAL LOW (ref 1.15–1.40)
Calcium, Ion: 0.86 mmol/L — CL (ref 1.15–1.40)
Calcium, Ion: 0.92 mmol/L — ABNORMAL LOW (ref 1.15–1.40)
Calcium, Ion: 0.86 mmol/L — CL (ref 1.15–1.40)
Calcium, Ion: 0.93 mmol/L — ABNORMAL LOW (ref 1.15–1.40)
Chloride: 101 mmol/L (ref 98–111)
Chloride: 102 mmol/L (ref 98–111)
Chloride: 103 mmol/L (ref 98–111)
Chloride: 102 mmol/L (ref 98–111)
Chloride: 104 mmol/L (ref 98–111)
Chloride: 102 mmol/L (ref 98–111)
Chloride: 104 mmol/L (ref 98–111)
Creatinine, Ser: 0.5 mg/dL (ref 0.44–1.00)
Creatinine, Ser: 0.5 mg/dL (ref 0.44–1.00)
Creatinine, Ser: 0.3 mg/dL — ABNORMAL LOW (ref 0.44–1.00)
Creatinine, Ser: 0.3 mg/dL — ABNORMAL LOW (ref 0.44–1.00)
Creatinine, Ser: 0.3 mg/dL — ABNORMAL LOW (ref 0.44–1.00)
Creatinine, Ser: 0.3 mg/dL — ABNORMAL LOW (ref 0.44–1.00)
Creatinine, Ser: 0.4 mg/dL — ABNORMAL LOW (ref 0.44–1.00)
Glucose, Bld: 118 mg/dL — ABNORMAL HIGH (ref 70–99)
Glucose, Bld: 138 mg/dL — ABNORMAL HIGH (ref 70–99)
Glucose, Bld: 127 mg/dL — ABNORMAL HIGH (ref 70–99)
Glucose, Bld: 134 mg/dL — ABNORMAL HIGH (ref 70–99)
Glucose, Bld: 154 mg/dL — ABNORMAL HIGH (ref 70–99)
Glucose, Bld: 131 mg/dL — ABNORMAL HIGH (ref 70–99)
Glucose, Bld: 176 mg/dL — ABNORMAL HIGH (ref 70–99)
HCT: 30 % — ABNORMAL LOW (ref 36.0–46.0)
HCT: 28 % — ABNORMAL LOW (ref 36.0–46.0)
HCT: 20 % — ABNORMAL LOW (ref 36.0–46.0)
HCT: 16 % — ABNORMAL LOW (ref 36.0–46.0)
HCT: 23 % — ABNORMAL LOW (ref 36.0–46.0)
HCT: 20 % — ABNORMAL LOW (ref 36.0–46.0)
HCT: 23 % — ABNORMAL LOW (ref 36.0–46.0)
Hemoglobin: 10.2 g/dL — ABNORMAL LOW (ref 12.0–15.0)
Hemoglobin: 9.5 g/dL — ABNORMAL LOW (ref 12.0–15.0)
Hemoglobin: 6.8 g/dL — CL (ref 12.0–15.0)
Hemoglobin: 5.4 g/dL — CL (ref 12.0–15.0)
Hemoglobin: 7.8 g/dL — ABNORMAL LOW (ref 12.0–15.0)
Hemoglobin: 6.8 g/dL — CL (ref 12.0–15.0)
Hemoglobin: 7.8 g/dL — ABNORMAL LOW (ref 12.0–15.0)
Potassium: 3.4 mmol/L — ABNORMAL LOW (ref 3.5–5.1)
Potassium: 3.5 mmol/L (ref 3.5–5.1)
Potassium: 4.8 mmol/L (ref 3.5–5.1)
Potassium: 4.5 mmol/L (ref 3.5–5.1)
Potassium: 5.8 mmol/L — ABNORMAL HIGH (ref 3.5–5.1)
Potassium: 4.2 mmol/L (ref 3.5–5.1)
Potassium: 4 mmol/L (ref 3.5–5.1)
Sodium: 139 mmol/L (ref 135–145)
Sodium: 140 mmol/L (ref 135–145)
Sodium: 139 mmol/L (ref 135–145)
Sodium: 139 mmol/L (ref 135–145)
Sodium: 140 mmol/L (ref 135–145)
Sodium: 140 mmol/L (ref 135–145)
Sodium: 141 mmol/L (ref 135–145)
TCO2: 27 mmol/L (ref 22–32)
TCO2: 24 mmol/L (ref 22–32)
TCO2: 28 mmol/L (ref 22–32)
TCO2: 26 mmol/L (ref 22–32)
TCO2: 27 mmol/L (ref 22–32)
TCO2: 26 mmol/L (ref 22–32)
TCO2: 26 mmol/L (ref 22–32)

## 2022-02-11 LAB — BASIC METABOLIC PANEL
Anion gap: 9 (ref 5–15)
BUN: 6 mg/dL — ABNORMAL LOW (ref 8–23)
CO2: 23 mmol/L (ref 22–32)
Calcium: 7 mg/dL — ABNORMAL LOW (ref 8.9–10.3)
Chloride: 109 mmol/L (ref 98–111)
Creatinine, Ser: 0.7 mg/dL (ref 0.44–1.00)
GFR, Estimated: >60 mL/min (ref >60)
Glucose, Bld: 227 mg/dL — ABNORMAL HIGH (ref 70–99)
Potassium: 3.9 mmol/L (ref 3.5–5.1)
Sodium: 141 mmol/L (ref 135–145)

## 2022-02-11 LAB — HEMOGLOBIN AND HEMATOCRIT, BLOOD
HCT: 23.9 % — ABNORMAL LOW (ref 36.0–46.0)
HCT: 21.2 % — ABNORMAL LOW (ref 36.0–46.0)
Hemoglobin: 8.2 g/dL — ABNORMAL LOW (ref 12.0–15.0)
Hemoglobin: 7.6 g/dL — ABNORMAL LOW (ref 12.0–15.0)

## 2022-02-11 LAB — GLUCOSE, CAPILLARY
Glucose-Capillary: 125 mg/dL — ABNORMAL HIGH (ref 70–99)
Glucose-Capillary: 176 mg/dL — ABNORMAL HIGH (ref 70–99)
Glucose-Capillary: 178 mg/dL — ABNORMAL HIGH (ref 70–99)
Glucose-Capillary: 177 mg/dL — ABNORMAL HIGH (ref 70–99)
Glucose-Capillary: 170 mg/dL — ABNORMAL HIGH (ref 70–99)
Glucose-Capillary: 208 mg/dL — ABNORMAL HIGH (ref 70–99)
Glucose-Capillary: 199 mg/dL — ABNORMAL HIGH (ref 70–99)

## 2022-02-11 LAB — POCT I-STAT EG7
Acid-Base Excess: 2 mmol/L (ref 0.0–2.0)
Bicarbonate: 26.3 mmol/L (ref 20.0–28.0)
Calcium, Ion: 0.97 mmol/L — ABNORMAL LOW (ref 1.15–1.40)
HCT: 22 % — ABNORMAL LOW (ref 36.0–46.0)
Hemoglobin: 7.5 g/dL — ABNORMAL LOW (ref 12.0–15.0)
O2 Saturation: 77 %
Potassium: 3.7 mmol/L (ref 3.5–5.1)
Sodium: 142 mmol/L (ref 135–145)
TCO2: 27 mmol/L (ref 22–32)
pCO2, Ven: 38.2 mmHg — ABNORMAL LOW (ref 44–60)
pH, Ven: 7.446 — ABNORMAL HIGH (ref 7.25–7.43)
pO2, Ven: 39 mmHg (ref 32–45)

## 2022-02-11 LAB — MAGNESIUM: Magnesium: 3.5 mg/dL — ABNORMAL HIGH (ref 1.7–2.4)

## 2022-02-11 LAB — PREPARE RBC (CROSSMATCH)

## 2022-02-11 LAB — PROTIME-INR
INR: 2 — ABNORMAL HIGH (ref 0.8–1.2)
Prothrombin Time: 22.8 seconds — ABNORMAL HIGH (ref 11.4–15.2)

## 2022-02-11 LAB — APTT: aPTT: 48 seconds — ABNORMAL HIGH (ref 24–36)

## 2022-02-11 LAB — PLATELET COUNT
Platelets: 149 10*3/uL — ABNORMAL LOW (ref 150–400)
Platelets: 132 10*3/uL — ABNORMAL LOW (ref 150–400)

## 2022-02-11 LAB — FIBRINOGEN: Fibrinogen: 158 mg/dL — ABNORMAL LOW (ref 210–475)

## 2022-02-11 LAB — TSH: TSH: 0.835 u[IU]/mL (ref 0.350–4.500)

## 2022-02-11 SURGERY — REPLACEMENT, MITRAL VALVE, REPEAT
Anesthesia: General | Site: Chest

## 2022-02-11 MED ORDER — FENTANYL CITRATE (PF) 250 MCG/5ML IJ SOLN
INTRAMUSCULAR | Status: DC | PRN
  Administered 2022-02-11: 100 ug via INTRAVENOUS
  Administered 2022-02-11: 150 ug via INTRAVENOUS
  Administered 2022-02-11: 250 ug via INTRAVENOUS
  Administered 2022-02-11 (×2): 100 ug via INTRAVENOUS
  Administered 2022-02-11: 50 ug via INTRAVENOUS
  Administered 2022-02-11: 100 ug via INTRAVENOUS

## 2022-02-11 MED ORDER — MILRINONE LACTATE IN DEXTROSE 20-5 MG/100ML-% IV SOLN
0.3000 ug/kg/min | INTRAVENOUS | Status: DC
  Administered 2022-02-12: 0.3 ug/kg/min via INTRAVENOUS
  Filled 2022-02-11: qty 100

## 2022-02-11 MED ORDER — INSULIN REGULAR(HUMAN) IN NACL 100-0.9 UT/100ML-% IV SOLN: INTRAVENOUS | Status: DC

## 2022-02-11 MED ORDER — SODIUM CHLORIDE 0.9% FLUSH
3.0000 mL | Freq: Two times a day (BID) | INTRAVENOUS | Status: DC
  Administered 2022-02-12 – 2022-02-21 (×18): 3 mL via INTRAVENOUS

## 2022-02-11 MED ORDER — ROCURONIUM BROMIDE 10 MG/ML (PF) SYRINGE
PREFILLED_SYRINGE | INTRAVENOUS | Status: AC
  Filled 2022-02-11: qty 40

## 2022-02-11 MED ORDER — FLUTICASONE PROPIONATE 50 MCG/ACT NA SUSP
2.0000 | Freq: Every day | NASAL | Status: DC
  Administered 2022-02-13 – 2022-02-20 (×7): 2 via NASAL
  Filled 2022-02-11 (×2): qty 16

## 2022-02-11 MED ORDER — LACTATED RINGERS IV SOLN: INTRAVENOUS | Status: DC

## 2022-02-11 MED ORDER — LACTATED RINGERS IV SOLN: 500.0000 mL | Freq: Once | INTRAVENOUS | Status: DC | PRN

## 2022-02-11 MED ORDER — ASPIRIN 325 MG PO TBEC
325.0000 mg | DELAYED_RELEASE_TABLET | Freq: Every day | ORAL | Status: DC
  Administered 2022-02-12 – 2022-02-14 (×3): 325 mg via ORAL
  Filled 2022-02-11 (×3): qty 1

## 2022-02-11 MED ORDER — ~~LOC~~ CARDIAC SURGERY, PATIENT & FAMILY EDUCATION
Freq: Once | Status: DC
  Filled 2022-02-11: qty 1

## 2022-02-11 MED ORDER — DEXMEDETOMIDINE HCL IN NACL 400 MCG/100ML IV SOLN
0.0000 ug/kg/h | INTRAVENOUS | Status: DC
  Administered 2022-02-11: 0.4 ug/kg/h via INTRAVENOUS
  Filled 2022-02-11: qty 100

## 2022-02-11 MED ORDER — FAMOTIDINE IN NACL 20-0.9 MG/50ML-% IV SOLN
20.0000 mg | Freq: Two times a day (BID) | INTRAVENOUS | Status: AC
  Administered 2022-02-11 (×2): 20 mg via INTRAVENOUS
  Filled 2022-02-11 (×2): qty 50

## 2022-02-11 MED ORDER — ALBUMIN HUMAN 5 % IV SOLN: INTRAVENOUS | Status: DC | PRN

## 2022-02-11 MED ORDER — SODIUM CHLORIDE 0.9 % IV SOLN: INTRAVENOUS | Status: DC

## 2022-02-11 MED ORDER — CHLORHEXIDINE GLUCONATE CLOTH 2 % EX PADS
6.0000 | MEDICATED_PAD | Freq: Every day | CUTANEOUS | Status: DC
  Administered 2022-02-11 – 2022-02-21 (×12): 6 via TOPICAL

## 2022-02-11 MED ORDER — HEPARIN SODIUM (PORCINE) 1000 UNIT/ML IJ SOLN
INTRAMUSCULAR | Status: DC | PRN
  Administered 2022-02-11: 20000 [IU] via INTRAVENOUS

## 2022-02-11 MED ORDER — LIOTHYRONINE SODIUM 5 MCG PO TABS
5.0000 ug | ORAL_TABLET | Freq: Every day | ORAL | Status: DC
  Administered 2022-02-12 – 2022-02-21 (×10): 5 ug via ORAL
  Filled 2022-02-11 (×10): qty 1

## 2022-02-11 MED ORDER — DOCUSATE SODIUM 100 MG PO CAPS
200.0000 mg | ORAL_CAPSULE | Freq: Every day | ORAL | Status: DC
  Administered 2022-02-12 – 2022-02-20 (×9): 200 mg via ORAL
  Filled 2022-02-11 (×9): qty 2

## 2022-02-11 MED ORDER — ONDANSETRON HCL 4 MG/2ML IJ SOLN
4.0000 mg | Freq: Four times a day (QID) | INTRAMUSCULAR | Status: DC | PRN
  Administered 2022-02-12 – 2022-02-18 (×8): 4 mg via INTRAVENOUS
  Filled 2022-02-11 (×8): qty 2

## 2022-02-11 MED ORDER — SODIUM CHLORIDE 0.9% FLUSH: 3.0000 mL | INTRAVENOUS | Status: DC | PRN

## 2022-02-11 MED ORDER — IPRATROPIUM-ALBUTEROL 0.5-2.5 (3) MG/3ML IN SOLN
3.0000 mL | RESPIRATORY_TRACT | Status: DC | PRN
  Administered 2022-02-12 – 2022-02-17 (×4): 3 mL via RESPIRATORY_TRACT
  Filled 2022-02-11 (×4): qty 3

## 2022-02-11 MED ORDER — CLOBETASOL PROPIONATE 0.05 % EX SOLN: 1.0000 | Freq: Every day | CUTANEOUS | Status: DC | PRN

## 2022-02-11 MED ORDER — ACETAMINOPHEN 160 MG/5ML PO SOLN: 650.0000 mg | Freq: Once | ORAL | Status: AC

## 2022-02-11 MED ORDER — IPRATROPIUM-ALBUTEROL 0.5-2.5 (3) MG/3ML IN SOLN
3.0000 mL | Freq: Once | RESPIRATORY_TRACT | Status: AC
  Administered 2022-02-11: 3 mL via RESPIRATORY_TRACT
  Filled 2022-02-11: qty 3

## 2022-02-11 MED ORDER — FENTANYL CITRATE PF 50 MCG/ML IJ SOSY
25.0000 ug | PREFILLED_SYRINGE | INTRAMUSCULAR | Status: DC | PRN
  Administered 2022-02-11 – 2022-02-14 (×2): 50 ug via INTRAVENOUS
  Filled 2022-02-11 (×2): qty 1

## 2022-02-11 MED ORDER — CHLORHEXIDINE GLUCONATE 0.12 % MT SOLN
15.0000 mL | OROMUCOSAL | Status: AC
  Administered 2022-02-11: 15 mL via OROMUCOSAL
  Filled 2022-02-11: qty 15

## 2022-02-11 MED ORDER — VANCOMYCIN HCL IN DEXTROSE 1-5 GM/200ML-% IV SOLN
1000.0000 mg | Freq: Once | INTRAVENOUS | Status: AC
  Administered 2022-02-11: 1000 mg via INTRAVENOUS
  Filled 2022-02-11: qty 200

## 2022-02-11 MED ORDER — FENTANYL CITRATE (PF) 250 MCG/5ML IJ SOLN
INTRAMUSCULAR | Status: AC
  Filled 2022-02-11: qty 5

## 2022-02-11 MED ORDER — SODIUM CHLORIDE 0.9 % IV SOLN: 250.0000 mL | INTRAVENOUS | Status: DC

## 2022-02-11 MED ORDER — ACETAMINOPHEN 500 MG PO TABS
1000.0000 mg | ORAL_TABLET | Freq: Four times a day (QID) | ORAL | Status: AC
  Administered 2022-02-13 – 2022-02-14 (×3): 1000 mg via ORAL
  Filled 2022-02-11 (×4): qty 2

## 2022-02-11 MED ORDER — SODIUM CHLORIDE 0.9 % IV SOLN: INTRAVENOUS | Status: DC | PRN

## 2022-02-11 MED ORDER — OXYCODONE HCL 5 MG PO TABS
5.0000 mg | ORAL_TABLET | ORAL | Status: DC | PRN
  Administered 2022-02-13 (×3): 10 mg via ORAL
  Administered 2022-02-13 (×2): 5 mg via ORAL
  Administered 2022-02-13 – 2022-02-14 (×3): 10 mg via ORAL
  Filled 2022-02-11 (×5): qty 2
  Filled 2022-02-11: qty 1
  Filled 2022-02-11: qty 2
  Filled 2022-02-11: qty 1

## 2022-02-11 MED ORDER — NITROGLYCERIN IN D5W 200-5 MCG/ML-% IV SOLN: 0.0000 ug/min | INTRAVENOUS | Status: DC

## 2022-02-11 MED ORDER — METOPROLOL TARTRATE 12.5 MG HALF TABLET
12.5000 mg | ORAL_TABLET | Freq: Once | ORAL | Status: AC
  Administered 2022-02-11: 12.5 mg via ORAL
  Filled 2022-02-11: qty 1

## 2022-02-11 MED ORDER — ALBUMIN HUMAN 5 % IV SOLN
250.0000 mL | INTRAVENOUS | Status: AC | PRN
  Administered 2022-02-11 (×2): 12.5 g via INTRAVENOUS

## 2022-02-11 MED ORDER — ASPIRIN 81 MG PO CHEW
324.0000 mg | CHEWABLE_TABLET | Freq: Every day | ORAL | Status: DC
  Filled 2022-02-11 (×2): qty 4

## 2022-02-11 MED ORDER — ACETAMINOPHEN 650 MG RE SUPP
650.0000 mg | Freq: Once | RECTAL | Status: AC
  Administered 2022-02-11: 650 mg via RECTAL

## 2022-02-11 MED ORDER — PHENYLEPHRINE 80 MCG/ML (10ML) SYRINGE FOR IV PUSH (FOR BLOOD PRESSURE SUPPORT)
PREFILLED_SYRINGE | INTRAVENOUS | Status: DC | PRN
  Administered 2022-02-11 (×2): 240 ug via INTRAVENOUS
  Administered 2022-02-11: 80 ug via INTRAVENOUS
  Administered 2022-02-11: 240 ug via INTRAVENOUS
  Administered 2022-02-11: 160 ug via INTRAVENOUS

## 2022-02-11 MED ORDER — PHENYLEPHRINE HCL-NACL 20-0.9 MG/250ML-% IV SOLN
0.0000 ug/min | INTRAVENOUS | Status: DC
  Administered 2022-02-12: 10 ug/min via INTRAVENOUS
  Filled 2022-02-11 (×2): qty 250

## 2022-02-11 MED ORDER — TRAMADOL HCL 50 MG PO TABS
50.0000 mg | ORAL_TABLET | ORAL | Status: DC | PRN
  Administered 2022-02-12 (×2): 50 mg via ORAL
  Administered 2022-02-13 (×2): 100 mg via ORAL
  Administered 2022-02-13 – 2022-02-14 (×2): 50 mg via ORAL
  Administered 2022-02-14 – 2022-02-18 (×4): 100 mg via ORAL
  Filled 2022-02-11 (×3): qty 2
  Filled 2022-02-11: qty 1
  Filled 2022-02-11 (×4): qty 2
  Filled 2022-02-11: qty 1
  Filled 2022-02-11: qty 2
  Filled 2022-02-11: qty 1

## 2022-02-11 MED ORDER — SODIUM CHLORIDE 0.45 % IV SOLN: INTRAVENOUS | Status: DC | PRN

## 2022-02-11 MED ORDER — LACTATED RINGERS IV SOLN: INTRAVENOUS | Status: DC | PRN

## 2022-02-11 MED ORDER — PHENYLEPHRINE 80 MCG/ML (10ML) SYRINGE FOR IV PUSH (FOR BLOOD PRESSURE SUPPORT)
PREFILLED_SYRINGE | INTRAVENOUS | Status: AC
  Filled 2022-02-11: qty 10

## 2022-02-11 MED ORDER — ROCURONIUM BROMIDE 10 MG/ML (PF) SYRINGE
PREFILLED_SYRINGE | INTRAVENOUS | Status: DC | PRN
  Administered 2022-02-11 (×6): 50 mg via INTRAVENOUS

## 2022-02-11 MED ORDER — FENTANYL CITRATE PF 50 MCG/ML IJ SOSY
25.0000 ug | PREFILLED_SYRINGE | INTRAMUSCULAR | Status: DC | PRN
  Administered 2022-02-11: 25 ug via INTRAVENOUS
  Filled 2022-02-11: qty 1

## 2022-02-11 MED ORDER — CHLORHEXIDINE GLUCONATE 0.12 % MT SOLN
15.0000 mL | Freq: Once | OROMUCOSAL | Status: AC
  Administered 2022-02-11: 15 mL via OROMUCOSAL
  Filled 2022-02-11: qty 15

## 2022-02-11 MED ORDER — MAGNESIUM SULFATE 4 GM/100ML IV SOLN
4.0000 g | Freq: Once | INTRAVENOUS | Status: AC
  Administered 2022-02-11: 4 g via INTRAVENOUS
  Filled 2022-02-11: qty 100

## 2022-02-11 MED ORDER — MIDAZOLAM HCL (PF) 10 MG/2ML IJ SOLN
INTRAMUSCULAR | Status: AC
  Filled 2022-02-11: qty 2

## 2022-02-11 MED ORDER — SODIUM BICARBONATE 8.4 % IV SOLN
50.0000 meq | Freq: Once | INTRAVENOUS | Status: AC
  Administered 2022-02-11: 50 meq via INTRAVENOUS

## 2022-02-11 MED ORDER — EPHEDRINE SULFATE-NACL 50-0.9 MG/10ML-% IV SOSY
PREFILLED_SYRINGE | INTRAVENOUS | Status: DC | PRN
  Administered 2022-02-11 (×2): 5 mg via INTRAVENOUS

## 2022-02-11 MED ORDER — VANCOMYCIN HCL 1000 MG IV SOLR
INTRAVENOUS | Status: DC | PRN
  Administered 2022-02-11: 1000 mL

## 2022-02-11 MED ORDER — EPHEDRINE 5 MG/ML INJ
INTRAVENOUS | Status: AC
  Filled 2022-02-11: qty 5

## 2022-02-11 MED ORDER — PANTOPRAZOLE SODIUM 40 MG PO TBEC
40.0000 mg | DELAYED_RELEASE_TABLET | Freq: Every day | ORAL | Status: DC
  Administered 2022-02-13 – 2022-02-21 (×9): 40 mg via ORAL
  Filled 2022-02-11 (×9): qty 1

## 2022-02-11 MED ORDER — METRONIDAZOLE 0.75 % EX CREA
1.0000 | TOPICAL_CREAM | Freq: Every morning | CUTANEOUS | Status: DC
  Filled 2022-02-11: qty 45

## 2022-02-11 MED ORDER — CHLORHEXIDINE GLUCONATE 4 % EX LIQD: 30.0000 mL | CUTANEOUS | Status: DC

## 2022-02-11 MED ORDER — PROPOFOL 10 MG/ML IV BOLUS
INTRAVENOUS | Status: AC
  Filled 2022-02-11: qty 20

## 2022-02-11 MED ORDER — 0.9 % SODIUM CHLORIDE (POUR BTL) OPTIME
TOPICAL | Status: DC | PRN
  Administered 2022-02-11: 5000 mL
  Administered 2022-02-11: 1000 mL

## 2022-02-11 MED ORDER — PROTAMINE SULFATE 10 MG/ML IV SOLN
INTRAVENOUS | Status: DC | PRN
  Administered 2022-02-11: 200 mg via INTRAVENOUS

## 2022-02-11 MED ORDER — LEVOTHYROXINE SODIUM 75 MCG PO TABS
75.0000 ug | ORAL_TABLET | Freq: Every day | ORAL | Status: DC
  Administered 2022-02-12 – 2022-02-21 (×10): 75 ug via ORAL
  Filled 2022-02-11 (×15): qty 1

## 2022-02-11 MED ORDER — METOPROLOL TARTRATE 12.5 MG HALF TABLET: 12.5000 mg | ORAL_TABLET | Freq: Two times a day (BID) | ORAL | Status: DC

## 2022-02-11 MED ORDER — POTASSIUM CHLORIDE 10 MEQ/50ML IV SOLN
10.0000 meq | INTRAVENOUS | Status: AC
  Administered 2022-02-11 (×3): 10 meq via INTRAVENOUS

## 2022-02-11 MED ORDER — EPINEPHRINE HCL 5 MG/250ML IV SOLN IN NS
0.0000 ug/min | INTRAVENOUS | Status: DC
  Administered 2022-02-11: 2 ug/min via INTRAVENOUS
  Filled 2022-02-11: qty 250

## 2022-02-11 MED ORDER — MIDAZOLAM HCL 2 MG/2ML IJ SOLN
2.0000 mg | INTRAMUSCULAR | Status: DC | PRN
  Administered 2022-02-11: 2 mg via INTRAVENOUS
  Filled 2022-02-11: qty 2

## 2022-02-11 MED ORDER — DEXTROSE 50 % IV SOLN
0.0000 mL | INTRAVENOUS | Status: DC | PRN
  Administered 2022-02-12: 25 mL via INTRAVENOUS
  Filled 2022-02-11: qty 50

## 2022-02-11 MED ORDER — PROPOFOL 10 MG/ML IV BOLUS
INTRAVENOUS | Status: DC | PRN
  Administered 2022-02-11: 70 mg via INTRAVENOUS

## 2022-02-11 MED ORDER — CEFAZOLIN SODIUM-DEXTROSE 2-4 GM/100ML-% IV SOLN
2.0000 g | Freq: Three times a day (TID) | INTRAVENOUS | Status: AC
  Administered 2022-02-11 – 2022-02-13 (×6): 2 g via INTRAVENOUS
  Filled 2022-02-11 (×6): qty 100

## 2022-02-11 MED ORDER — BISACODYL 5 MG PO TBEC
10.0000 mg | DELAYED_RELEASE_TABLET | Freq: Every day | ORAL | Status: DC
  Administered 2022-02-12 – 2022-02-19 (×8): 10 mg via ORAL
  Filled 2022-02-11 (×8): qty 2

## 2022-02-11 MED ORDER — BISACODYL 10 MG RE SUPP: 10.0000 mg | Freq: Every day | RECTAL | Status: DC

## 2022-02-11 MED ORDER — MIDAZOLAM HCL (PF) 5 MG/ML IJ SOLN
INTRAMUSCULAR | Status: DC | PRN
  Administered 2022-02-11: 3 mg via INTRAVENOUS
  Administered 2022-02-11: 2 mg via INTRAVENOUS

## 2022-02-11 MED ORDER — METOPROLOL TARTRATE 25 MG/10 ML ORAL SUSPENSION: 12.5000 mg | Freq: Two times a day (BID) | ORAL | Status: DC

## 2022-02-11 MED ORDER — ACETAMINOPHEN 160 MG/5ML PO SOLN
1000.0000 mg | Freq: Four times a day (QID) | ORAL | Status: AC
  Administered 2022-02-11 – 2022-02-12 (×2): 1000 mg
  Filled 2022-02-11: qty 40.6

## 2022-02-11 MED ORDER — PLASMA-LYTE A IV SOLN
INTRAVENOUS | Status: DC | PRN
  Administered 2022-02-11: 500 mL

## 2022-02-11 MED ORDER — METOPROLOL TARTRATE 5 MG/5ML IV SOLN: 2.5000 mg | INTRAVENOUS | Status: DC | PRN

## 2022-02-11 SURGICAL SUPPLY — 104 items
ADAPTER CARDIO PERF ANTE/RETRO (ADAPTER) IMPLANT
ADPR PRFSN 84XANTGRD RTRGD (ADAPTER)
BAG DECANTER FOR FLEXI CONT (MISCELLANEOUS) ×3 IMPLANT
BELT SUTURE MIAMI (INSTRUMENTS) IMPLANT
BLADE CLIPPER SURG (BLADE) ×3 IMPLANT
BLADE STERNUM SYSTEM 6 (BLADE) ×3 IMPLANT
BLADE SURG 11 STRL SS (BLADE) IMPLANT
BLADE SURG 15 STRL LF DISP TIS (BLADE) IMPLANT
BLADE SURG 15 STRL SS (BLADE) ×2
BOOT SUTURE AID YELLOW STND (SUTURE) ×3 IMPLANT
BRUSH SCRUB EZ PLAIN DRY (MISCELLANEOUS) IMPLANT
CANISTER SUCT 3000ML PPV (MISCELLANEOUS) ×3 IMPLANT
CANN PRFSN 3/8X14X24FR PCFC (MISCELLANEOUS)
CANNULA AORTIC ROOT 9FR (CANNULA) IMPLANT
CANNULA NON VENT 20FR 12 (CANNULA) IMPLANT
CANNULA NON VENT 22FR 12 (CANNULA) ×3 IMPLANT
CANNULA PRFSN 3/8X14X24FR PCFC (MISCELLANEOUS) IMPLANT
CANNULA SUMP PERICARDIAL (CANNULA) ×3 IMPLANT
CANNULA VEN MTL TIP RT (MISCELLANEOUS)
CANNULA VRC MALB SNGL STG 28FR (MISCELLANEOUS) IMPLANT
CATH CPB KIT GERHARDT (MISCELLANEOUS) ×3 IMPLANT
CATH HEART VENT LEFT (CATHETERS) ×3 IMPLANT
CATH RETROPLEGIA CORONARY 14FR (CATHETERS) IMPLANT
CATH ROBINSON RED A/P 18FR (CATHETERS) ×9 IMPLANT
CATH THOR STR 32F SOFT 20 RADI (CATHETERS) ×3 IMPLANT
CATH THORACIC 28FR RT ANG (CATHETERS) ×3 IMPLANT
CLIP VESOCCLUDE MED 24/CT (CLIP) IMPLANT
CLIP VESOCCLUDE SM WIDE 24/CT (CLIP) IMPLANT
CNTNR URN SCR LID CUP LEK RST (MISCELLANEOUS) IMPLANT
CONN 1/2X3/8X3/8 Y GISH (MISCELLANEOUS) IMPLANT
CONT SPEC 4OZ STRL OR WHT (MISCELLANEOUS) ×4
CONTAINER PROTECT SURGISLUSH (MISCELLANEOUS) ×12 IMPLANT
DEFOGGER ANTIFOG KIT (MISCELLANEOUS) ×3 IMPLANT
DEVICE SUT CK QUICK LOAD INDV (Prosthesis & Implant Heart) IMPLANT
DEVICE SUT CK QUICK LOAD MINI (Prosthesis & Implant Heart) IMPLANT
DRAPE CV SPLIT W-CLR ANES SCRN (DRAPES) ×3 IMPLANT
DRAPE INCISE IOBAN 66X45 STRL (DRAPES) ×3 IMPLANT
DRAPE PERI GROIN 82X75IN TIB (DRAPES) ×3 IMPLANT
DRAPE SLUSH MACHINE 52X66 (DRAPES) IMPLANT
DRAPE WARM FLUID 44X44 (DRAPES) ×3 IMPLANT
DRSG AQUACEL AG ADV 3.5X10 (GAUZE/BANDAGES/DRESSINGS) IMPLANT
DRSG AQUACEL AG ADV 3.5X14 (GAUZE/BANDAGES/DRESSINGS) ×3 IMPLANT
ELECT CAUTERY BLADE 6.4 (BLADE) ×3 IMPLANT
ELECT REM PT RETURN 9FT ADLT (ELECTROSURGICAL) ×4
ELECTRODE REM PT RTRN 9FT ADLT (ELECTROSURGICAL) ×6 IMPLANT
FELT TEFLON 1X6 (MISCELLANEOUS) ×3 IMPLANT
GAUZE SPONGE 4X4 12PLY STRL (GAUZE/BANDAGES/DRESSINGS) ×3 IMPLANT
GAUZE SPONGE 4X4 12PLY STRL LF (GAUZE/BANDAGES/DRESSINGS) IMPLANT
GLOVE BIOGEL PI IND STRL 6 (GLOVE) IMPLANT
GLOVE BIOGEL PI IND STRL 7.5 (GLOVE) IMPLANT
GLOVE ECLIPSE 7.5 STRL STRAW (GLOVE) IMPLANT
GLOVE SS BIOGEL STRL SZ 6 (GLOVE) IMPLANT
GLOVE SS BIOGEL STRL SZ 7.5 (GLOVE) IMPLANT
GOWN STRL REUS W/ TWL LRG LVL3 (GOWN DISPOSABLE) ×18 IMPLANT
GOWN STRL REUS W/ TWL XL LVL3 (GOWN DISPOSABLE) IMPLANT
GOWN STRL REUS W/TWL LRG LVL3 (GOWN DISPOSABLE) ×16
GOWN STRL REUS W/TWL XL LVL3 (GOWN DISPOSABLE) ×2
INSERT FOGARTY 61MM (MISCELLANEOUS) IMPLANT
INSERT FOGARTY XLG (MISCELLANEOUS) IMPLANT
KIT BASIN OR (CUSTOM PROCEDURE TRAY) ×3 IMPLANT
KIT SUT CK MINI COMBO 4X17 (Prosthesis & Implant Heart) IMPLANT
KIT TURNOVER KIT B (KITS) ×3 IMPLANT
LINE VENT (MISCELLANEOUS) IMPLANT
NS IRRIG 1000ML POUR BTL (IV SOLUTION) IMPLANT
ORGANIZER SUTURE GABBAY-FRATER (MISCELLANEOUS) IMPLANT
PACK E OPEN HEART (SUTURE) ×3 IMPLANT
PACK OPEN HEART (CUSTOM PROCEDURE TRAY) ×3 IMPLANT
PAD ARMBOARD 7.5X6 YLW CONV (MISCELLANEOUS) ×6 IMPLANT
PAD ELECT DEFIB RADIOL ZOLL (MISCELLANEOUS) ×3 IMPLANT
PENCIL BUTTON HOLSTER BLD 10FT (ELECTRODE) ×3 IMPLANT
POSITIONER HEAD DONUT 9IN (MISCELLANEOUS) ×3 IMPLANT
SET MPS 3-ND DEL (MISCELLANEOUS) IMPLANT
SOL PREP POV-IOD 4OZ 10% (MISCELLANEOUS) IMPLANT
SOL SCRUB PVP POV-IOD 4OZ 7.5% (MISCELLANEOUS) ×4
SOLUTION SCRB POV-IOD 4OZ 7.5% (MISCELLANEOUS) IMPLANT
SUPPORT HEART JANKE-BARRON (MISCELLANEOUS) ×3 IMPLANT
SUT ETHIBOND 2 0 SH (SUTURE) IMPLANT
SUT ETHIBOND 3 0 SH 1 (SUTURE) IMPLANT
SUT MNCRL AB 4-0 PS2 18 (SUTURE) ×6 IMPLANT
SUT PROLENE 4 0 RB 1 (SUTURE) ×20
SUT PROLENE 4 0 SH DA (SUTURE) ×9 IMPLANT
SUT PROLENE 4-0 RB1 .5 CRCL 36 (SUTURE) ×6 IMPLANT
SUT PROLENE 5 0 RB 2 (SUTURE) IMPLANT
SUT PROLENE 6 0 C 1 30 (SUTURE) IMPLANT
SUT SILK 4 0 TIE 10X30 (SUTURE) IMPLANT
SUT STEEL 6MS V (SUTURE) ×3 IMPLANT
SUT STEEL SZ 6 DBL 3X14 BALL (SUTURE) ×6 IMPLANT
SUT TEM PAC WIRE 2 0 SH (SUTURE) IMPLANT
SUT VIC AB 0 CTX 36 (SUTURE) ×4
SUT VIC AB 0 CTX36XBRD ANTBCTR (SUTURE) ×6 IMPLANT
SUT VIC AB 2-0 CT1 27 (SUTURE) ×6
SUT VIC AB 2-0 CT1 TAPERPNT 27 (SUTURE) ×6 IMPLANT
SYR BULB IRRIG 60ML STRL (SYRINGE) IMPLANT
SYSTEM SAHARA CHEST DRAIN ATS (WOUND CARE) ×3 IMPLANT
TOWEL GREEN STERILE (TOWEL DISPOSABLE) ×3 IMPLANT
TOWEL GREEN STERILE FF (TOWEL DISPOSABLE) ×3 IMPLANT
TRAY FOLEY SLVR 16FR TEMP STAT (SET/KITS/TRAYS/PACK) ×3 IMPLANT
TUBE CONNECTING 20X1/4 (TUBING) IMPLANT
UNDERPAD 30X36 HEAVY ABSORB (UNDERPADS AND DIAPERS) ×3 IMPLANT
VALVE MITRAL MITRIS RESILIA 25 (Valve) IMPLANT
VALVE MITRAL SZ 27 (Prosthesis & Implant Heart) IMPLANT
VENT LEFT HEART 12002 (CATHETERS) ×2
VRC MALLEABLE SINGLE STG 28FR (MISCELLANEOUS) ×2
WATER STERILE IRR 1000ML POUR (IV SOLUTION) IMPLANT

## 2022-02-11 NOTE — Anesthesia Procedure Notes (Signed)
Procedure Name: Intubation Date/Time: 02/11/2022 7:40 AM  Performed by: Thelma Comp, CRNAPre-anesthesia Checklist: Patient identified, Emergency Drugs available, Suction available and Patient being monitored Patient Re-evaluated:Patient Re-evaluated prior to induction Oxygen Delivery Method: Circle System Utilized Preoxygenation: Pre-oxygenation with 100% oxygen Induction Type: IV induction Ventilation: Mask ventilation without difficulty Laryngoscope Size: Mac and 3 Grade View: Grade II Tube type: Oral Tube size: 7.5 mm Number of attempts: 1 Airway Equipment and Method: Stylet and Oral airway Placement Confirmation: ETT inserted through vocal cords under direct vision, positive ETCO2 and breath sounds checked- equal and bilateral Secured at: 23 cm Tube secured with: Tape Dental Injury: Teeth and Oropharynx as per pre-operative assessment

## 2022-02-11 NOTE — Progress Notes (Signed)
NAME:  Elaine Le, MRN:  [id number], DOB:  Nov 26, 1960, LOS: 0 ADMISSION DATE:  02/11/2022, CONSULTATION DATE:  02/11/2022 REFERRING MD:  Yolanda Manges, CHIEF COMPLAINT:  post-operative care.   History of Present Illness:  61 year old woman who presented for elective redo-MVR.   She has rheumatic heart disease and had a bioprosthetic MVR in 2017.  In the last year however (since having COVID) she has developed NYHA II-III dyspnea and lower extremity edema. Denied chest pain.  Work-up revealed severe MR with moderate PAH (sPAP 52)   She has seen Dr Shearon Stalls for COPD. Last seen in 09/2021.  PFT's showed moderate airflow obstruction.  She has OSA uses CPAP  Pertinent  Medical History   Past Medical History:  Diagnosis Date   Acute respiratory failure with hypercapnia ([place]) 27/06/5007   Acute systolic heart failure ([place]) 09/2014   Adenomatous colon polyp    Arthritis    Back pain    Chronic diastolic heart failure (HCC)    related to MR/MS   Chronic pain syndrome    Chronic sinusitis    COPD (chronic obstructive pulmonary disease)-PFT pending  07/17/2014   Diverticulosis    Dyspnea    Fibromyalgia    GERD (gastroesophageal reflux disease)    Headaches, cluster    Heart murmur    Hiatal hernia    Hx of adenomatous colonic polyps 03/10/2011   Oct 2012, repeat colon Oct 2017    Hypothyroidism    Internal hemorrhoids    Irritable bowel syndrome (IBS)    Mitral valve regurgitation    severe   Mitral valve stenosis, moderate 07/11/2014   Pneumonia    Feb. 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 Required replacement with larger valve and correction of contained AV dissociation.   Objective   Blood pressure 104/61, pulse 81, temperature 98.6 F (37 C), temperature source Oral, resp. rate (!) 23, height 5' (1.524 m), weight 50.8 kg, SpO2 99 %.        Intake/Output Summary (Last 24 hours) at 02/11/2022 1413 Last data filed at 02/11/2022 1354 Gross per 24 hour  Intake 2145 ml  Output 2300 ml  Net -155 ml   Filed Weights   02/11/22 0559  Weight: 50.8 kg   Examination: General: intubated, still paralyzed. Small stature HENT: orally intubated, OGT in place Lungs: Diffuse wheezing Cardiovascular: extremities warm. HS distant, no rub. Minimal chest tube output Abdomen: soft.  Extremities: no edema Neuro: sedated but tearing GU: Clear urine  Ancillary tests personally reviewed:  PFT's personally reviewed shows mixed restrictive and obstructive pattern with no bronchodilator change.  No gas trapping CT chest shows diaphragmatic flattening with emphysematous changes at the apices.  HB 7.8  Assessment & Plan:   Critically ill due to cardiogenic and distributive shock (  expected) following redo MVR. - Titrate epinephrine to keep ScvO2>65 - pulled PAC as unable to obtain adequate tracing.  - Titrate phenylephrine to keep MAP >65.  - Continue AV pacing at 86 to maintain CO - Start multimodal pain control including early enterals.   Critically ill due to postoperative mechanical ventilation  - Marginal oxygenation, expected due to preexisting COPD and pulmonary edema. - May not be able to fast track.  GOLD stage II (moderate) COPD - well controlled  on prn albuterol only  - Continue bronchodilators perioperatively. - Will administer prior to attempting extubation.  - Currently abstinent from tobacco.   OSA  - Resume home CPAP post op.  Hypothyroidism - Continue home liothyroxine and levothyroxine. - Recheck TSH, FT4.   Best Practice (right click and "Reselect all SmartList Selections" daily)   Diet/type: NPO DVT prophylaxis: SCD GI prophylaxis: H2B Lines: Central line, Arterial Line, and yes and it is still needed Foley:  Yes, and it is still needed Code Status:  full code Last date of multidisciplinary goals of care discussion [updated by Dr Lavonna Monarch  CRITICAL CARE Performed by: Kipp Brood   Total critical care time: 50 minutes  Critical care time was exclusive of separately billable procedures and treating other patients.  Critical care was necessary to treat or prevent imminent or life-threatening deterioration.  Critical care was time spent personally by me on the following activities: development of treatment plan with patient and/or surrogate as well as nursing, discussions with consultants, evaluation of patient's response to treatment, examination of patient, obtaining history from patient or surrogate, ordering and performing treatments and interventions, ordering and review of laboratory studies, ordering and review of radiographic studies, pulse oximetry, re-evaluation of patient's condition and participation in multidisciplinary rounds.  Kipp Brood, MD [facility] ICU Physician [place]  Pager: [phone] Mobile: [phone] After hours: [phone].

## 2022-02-11 NOTE — Progress Notes (Signed)
  Echocardiogram Echocardiogram Transesophageal has been performed.  Bobbye Charleston 02/11/2022, 9:10 AM

## 2022-02-11 NOTE — Anesthesia Preprocedure Evaluation (Signed)
Anesthesia Evaluation  Patient identified by MRN, date of birth, ID band Patient awake    Reviewed: Allergy & Precautions, NPO status , Patient's Chart, lab work & pertinent test results  History of Anesthesia Complications Negative for: history of anesthetic complications  Airway Mallampati: II  TM Distance: <3 FB Neck ROM: Full    Dental  (+) Teeth Intact, Dental Advisory Given   Pulmonary shortness of breath, sleep apnea , COPD,  COPD inhaler, former smoker,    breath sounds clear to auscultation       Cardiovascular (-) angina+ CAD  (-) Past MI + Valvular Problems/Murmurs (s/p mini MVR 11/08/2014) MR  Rhythm:Regular  1. 25 mm bioprosthesis. MG 14 @ 78 bpm. Emax 2.5 m/s, EOA 1.14 cm2, DVI  2.6, PHT 72 ms. All suggest severe regurgitation. Based on prior TEE, MR  is severe. All parameters point to severe MR to explain elevated gradient.  The mitral valve has been  repaired/replaced. Severe mitral valve regurgitation. There is a 25 mm  Edwards Magna bioprosthetic valve present in the mitral position.  Procedure Date: 11/08/2014.  2. Left ventricular ejection fraction, by estimation, is 65 to 70%. The  left ventricle has normal function. The left ventricle has no regional  wall motion abnormalities. Left ventricular diastolic function could not  be evaluated.  3. Right ventricular systolic function is normal. The right ventricular  size is normal. Tricuspid regurgitation signal is inadequate for assessing  PA pressure.  4. Left atrial size was mild to moderately dilated.  5. The aortic valve is tricuspid. Aortic valve regurgitation is not  visualized. No aortic stenosis is present.  6. The inferior vena cava is normal in size with greater than 50%  respiratory variability, suggesting right atrial pressure of 3 mmHg.   Comparison(s): No significant change from prior study. MR remains severe.    Neuro/Psych  Headaches,   Neuromuscular disease    GI/Hepatic Neg liver ROS, hiatal hernia, GERD  ,  Endo/Other  Hypothyroidism   Renal/GU negative Renal ROS     Musculoskeletal  (+) Arthritis , Fibromyalgia -  Abdominal   Peds  Hematology negative hematology ROS (+)   Anesthesia Other Findings Day of surgery medications reviewed with the patient.  Reproductive/Obstetrics                             Anesthesia Physical Anesthesia Plan  ASA: 4  Anesthesia Plan: General   Post-op Pain Management:    Induction: Intravenous  PONV Risk Score and Plan: 3 and Ondansetron  Airway Management Planned: Oral ETT  Additional Equipment: Arterial line, CVP, PA Cath, TEE and Ultrasound Guidance Line Placement  Intra-op Plan:   Post-operative Plan: Post-operative intubation/ventilation  Informed Consent: I have reviewed the patients History and Physical, chart, labs and discussed the procedure including the risks, benefits and alternatives for the proposed anesthesia with the patient or authorized representative who has indicated his/her understanding and acceptance.     Dental advisory given  Plan Discussed with: CRNA  Anesthesia Plan Comments:         Anesthesia Quick Evaluation

## 2022-02-11 NOTE — Brief Op Note (Signed)
02/11/2022  1:45 PM  PATIENT:  Ethelda Chick  61 y.o. female  PRE-OPERATIVE DIAGNOSIS:  SEVERE MITRAL REGURGITATION STATUS POST MINI MITRAL VALVE REPLACEMENT  POST-OPERATIVE DIAGNOSIS:  SEVERE MITRAL REGURGITATION STATUS POST MINI MITRAL VALVE REPLACEMENT  PROCEDURE:  Procedure(s): REDO MITRAL VALVE REPLACEMENT (MVR) VIA STERNOTOMY USING 27 MM MOSAIC PORCINE MITRAL VALVE (N/A) TRANSESOPHAGEAL ECHOCARDIOGRAM (TEE) (N/A)  SURGEON:  Surgeon(s) and Role:    [name], MD - Primary  PHYSICIAN ASSISTANT: Ilsa Bonello PA-C  ASSISTANTS: STAFF RNFA   ANESTHESIA:   general  EBL:  {None/Minimal: 21241}   BLOOD ADMINISTERED:{BLOOD GIVEN TYPES AND AMOUNTS:20467}  DRAINS:  MEDIASTINAL CHEST TUBES    LOCAL MEDICATIONS USED:  NONE  SPECIMEN:  Source of Specimen:  Previous mitral valve prosthetic  DISPOSITION OF SPECIMEN:  PATHOLOGY  COUNTS:  YES  TOURNIQUET:  * No tourniquets in log *  DICTATION: .Dragon Dictation  PLAN OF CARE: Admit to inpatient   PATIENT DISPOSITION:  ICU - intubated and hemodynamically stable.   Delay start of Pharmacological VTE agent (>24hrs) due to surgical blood loss or risk of bleeding: yes  Complications: No known

## 2022-02-11 NOTE — OR Nursing (Signed)
Patient's current 25 MM Magna Mitral Valve was explanted intra-operatively at 1018 on 02/11/2022, by Dr. Lavonna Monarch.

## 2022-02-11 NOTE — Anesthesia Procedure Notes (Addendum)
Arterial Line Insertion Start/End10/17/2023 7:40 AM, 02/11/2022 7:42 AM Performed by: Oleta Mouse, MD, anesthesiologist  Patient location: OR. Preanesthetic checklist: patient identified, IV checked, risks and benefits discussed, surgical consent, monitors and equipment checked, pre-op evaluation, timeout performed and anesthesia consent Lidocaine 1% used for infiltration Left, radial was placed Catheter size: 20 G Hand hygiene performed  and maximum sterile barriers used   Attempts: 1 Procedure performed using ultrasound guided technique. Ultrasound Notes:anatomy identified, needle tip was noted to be adjacent to the nerve/plexus identified and no ultrasound evidence of intravascular and/or intraneural injection Following insertion, dressing applied and Biopatch. Post procedure assessment: normal and unchanged  Patient tolerated the procedure well with no immediate complications. Additional procedure comments: Attempted aline placement in preop, pt did not tolerate, lines placed in OR after induction.Marland Kitchen

## 2022-02-11 NOTE — Op Note (Addendum)
CARDIOVASCULAR SURGERY OPERATIVE NOTE  02/11/2022 Elaine Le [id number]  Surgeon:  Everlena Cooper, MD  First Assistant: Jadene Pierini [facility]   Preoperative Diagnosis:  Severe Mitral Prosthetic Stenosis   Postoperative Diagnosis:  Same   Procedure:  Median Sternotomy Extracorporeal circulation 3.   Redo Mitral Valve replacement initially  using a 25 mm Mitris Pericardial valve which needed removal and placement of a 27 mm Mosaic Porcine valve (SN [id number])  Anesthesia:  Laurie Panda MD General Endotracheal   Clinical History/Surgical Indication:  Prosthetic Mitral Valve deterioration with severe MR and MS    Pt has NYHA class 2 symptoms and severe prosthetic valve MR and MS. She has a class I indication for re-replacement . As this is her second upcomming cardiac procedure, mechanical prosthesis at her age would be best option however with her documented allergy to coumadin, this is not possible. She would best be served with repeat MVR with the tissue prosthesis Mitris with reselia treatment and hopefully will obtain longer longevity over the previous model. Pt and friend understand these issues and agree. Long term management when this valve has need for re-replacement would most likely be transcatheter options. I have discussed the anticipated treatment of redo MVR via sternotomy this time and will obtain non contrast CT to finalize workup. All the risks and goals and anticipated recovery were discussed with pt and she agrees.  Preparation:  The patient was seen in the preoperative holding area and the correct patient, correct operation were confirmed with the patient after reviewing the medical record and catheterization. The consent was signed by me. Preoperative antibiotics were given. A pulmonary arterial line and radial arterial line were placed by the anesthesia team. The patient was taken back to the operating room and positioned supine on the operating room table. After  being placed under general endotracheal anesthesia by the anesthesia team a foley catheter was placed. The neck, chest, abdomen, and both legs were prepped with betadine soap and solution and draped in the usual sterile manner. A surgical time-out was taken and the correct patient and operative procedure were confirmed with the nursing and anesthesia staff.   Pre-bypass TEE:   Complete TEE assessment was performed by Dr. Laurie Panda.  Normal LV function with severe prosthetic valve MS and moderate MR   Post-bypass TEE:  Initial separation from bypass with Mitris valve had  moderate to severe central regurgitation which what appeared to be from an impinged leaflet anteriorly which was not acceptable  Normal functioning prosthetic mitral valve valve with no perivalvular leak or regurgitation through the valve. Left ventricular function preserved.    Media Information           Operation The patient was brought to the operating room theater and placed on the table in supine position.  After general anesthesia was obtained with the use of an endotracheal tube the chest abdomen and legs were prepped and draped in usual sterile fashion.  A median sternotomy incision then created and sternal valve the sternal saw.  At this point it was noted that there was dense adhesions along the right atrial side of the heart.  The sternal retractor was put in place and dense adhesions were begun to be taken down.  Heparin was delivered following complete dissection of the ascending aorta and the right atrial appendage.  It was felt that further dissection would be facilitated with decompression by bypass.  Therefore the aorta was cannulated with a 20 arterial cannula and a  35 French straight cannula is placed in the right atrium and directed towards the inferior vena cava.  With adequate confirmation of anticoagulation cardiopulmonary bypass was instituted.  With decompression there was definite improvement in  visualization for taking down the adhesions however was then noted that there was no pericardium whatsoever along the right atrium.  The heart was dissected away from the mediastinal structures and lung and following this the right angle cannula was inserted in the superior vena cava for added venous drainage.  The entire heart was dissected from the pericardial well from dense adhesions.  Following this an antegrade cardioplegia catheter was placed in the ascending aorta with adequate bypass the aortic cross-clamp was placed and cold KBC cardioplegia was delivered antegrade for total of 1 L.  Following this the left atrium was opened and the previous left atrial incision and difficult exposure was immediately encountered.  The left atrium was small and the patient's habitus did not lend to facilitation of visualization of the previous mitral prosthetic.  Even with this with retraction on the previous core knot sutures these sutures were removed individually enough to free the valve sewing cuff from the heart tissues.  Care was taken to stay within the cuff of the previous Edwards valve.  Following extraction of the valve the annulus was debrided with rongeurs and removal as of as many pledgeted material was removed.  Following this the left atrial appendage was oversewn linearly.  With a 4-0 Prolene suture.  Following this sutures were then placed with the pledgeted sutures on the atrium using the previous suture as retraction to be able to see the annulus.  Following this the 25 mm Maitra's pericardial prosthesis was then secured.  Left atrium was closed over the ventricular sump and the patient had ventricular and atrial pacing wires placed following removal of the cross-clamp.  Following this the patient was de-aired and was weaned from cardiopulmonary bypass TEE at this point revealed the central regurgitation of the leaflets of the mitral valve and this was not felt to be acceptable and the aortic cross-clamp  was replaced and an additional 1 L of KBC cardioplegia was delivered.  The previous left atrial incision was reopened and again difficult exposure of the valve was encountered.  The valve was removed and all the pledgeted material was removed in addition.  It was at this moment that it was evident that the partial A-V dissociation of the posterior annulus was evident with a large gap between the scarred ventricular surface and the left atrial tissue.  Therefore pledgeted 2-0 Tevdek Foley sutures were placed in the ventricular scar tissue and brought up through the atrium and the entire posterior annulus.  The anterior suture line was performed with the pledgets on the atrial side.  This time was chosen the Mosaic valve as the valve of choice secondary to not having any issues with a cantilevered valve if this was again encountered due to its size and occupation of the difficult space.  We were able to actually get in a 27 mm Mosaic valve.  Following this the left atrium was again closed over ventricular sump with 4-0 Prolene sutures and a rewarming dose of KBC was delivered for 400 cc.  Patient was placed in the headdown position and aortic cross-clamp placed. Patient with adequate rewarming was then weaned from bypass following de-airing and removal of the LA sump.  There was no regurgitation of the mitral prosthesis with a mean gradient of 2.  With this  protamine was delivered in the patient was decannulated and sites oversewn necessary. With adequate hemostasis chest tubes were brought inferior stab wounds and secured sternum was reapproximated with interrupted stainless steel wire and the presternal subcutaneous tissue and skin were closed multiple layers observable suture and sterile dressings were applied. total perfusion time was 248 minutes and the initial cross-clamp time was 98 minutes with 72 minutes being the second cross-clamp time

## 2022-02-11 NOTE — Progress Notes (Signed)
eLink Physician-Brief Progress Note Patient Name: Elaine Le DOB: [birth year] MRN: [id number]   Date of Service  02/11/2022  HPI/Events of Note  Agitation - Nursing request for bilateral soft wrist restraints.   eICU Interventions  Will order bilateral soft wrist restraints X 10 hours.      Intervention Category Major Interventions: Delirium, psychosis, severe agitation - evaluation and management  [name] 02/11/2022, 11:37 PM

## 2022-02-11 NOTE — Hospital Course (Addendum)
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 Pt had an uneventful post operative recovery except for developing hives on the lower extremeties due to coumadin which resolved after it was discontinued. Pt has been doing well up to the past year when she developed COVID and from that point describes an acute change in her ability to walk distances as before needing to stop due to DOE. She has good days and bad days with the bad days needing to stop and rest after a flight of stairs or over 50 to 100 yrds. She has some palpitations but no lower ext edema. She has no CP. She had an echo 2/23 with now new severe MR and MS. This has been confirmed again with repeat echo this August. She has been followed but has felt that she should be considering reoperation and to that end was taken to the cath lab where PA pressures were moderately elevated with SPAP 41mHg. She had no evidence of CAD. Pt now presents for discussion of options moving forward.   Dr. WLavonna Monarchreviewed the patient's chart, labs and diagnostic studies and determined surgical redo mitral valve replacement would provide this patient with the best long term treatment. He discussed her treatment options as well as risks and benefits of surgery. Ms. TGaonawas agreeable to proceed with surgery.  [facility]. She underwent a redo mitral valve replacement initially with a 25 mm Mitris pericardial valve which was removed and replaced with a 241mMosaic porcine valve.  The patient tolerated the procedure and was taken to the SICU in stable condition.  The patient was weaned and extubated on POD #1.  She was pacer dependent and EP consult was obtained.  They will monitor patient for signs of conduction  return.  She was weaned off Epinephrine, Milrinone, and Phenylephrine as hemodynamics allowed.  The patient was started on diuretics for volume overloaded state.  Her arterial and central line were removed without difficulty.  She had mild hyponatremia due to diuretics.  The patient's native conduction did not return.  She would be monitored over the weekend and her temporary pacing had to be adjusted.  Her conduction did not recover and she underwent placement of PPM 02/17/2022. She tolerated the procedure well. Her epicardial pacing wires were removed without complication. Her foley was removed routinely. She was started on a low dose beta blocker that was titrated as able. Eliquis was started for her bioprosthetic valve. She was felt stable for transfer to the progressive unit. Her home levothyroxine was restarted for hypothyroidism. She remained fluid overloaded with small pleural effusions so she was aggressively diuresed. She experienced nightly dyspnea with desaturation, uses CPAP at home but refused CPAP in hospital. She experienced some new swelling in her LUE that was closely monitored. She underwent a fall onto her bottom as she was sitting on the foot rest of the recliner and it collapsed from under her. No injuries were noted. She responded well to aggressive diuretics.  She was resumed on Eliquis prior to discharge.  She is off oxygen.  She is ambulating independently.  Her surgical incisions are healing without evidence of infection.  She is medically stable for discharge home today.

## 2022-02-11 NOTE — Transfer of Care (Signed)
Immediate Anesthesia Transfer of Care Note  Patient: NAMIKO PRITTS  Procedure(s) Performed: REDO MITRAL VALVE REPLACEMENT (MVR) VIA STERNOTOMY USING 27 MM MOSAIC PORCINE MITRAL VALVE (Chest) TRANSESOPHAGEAL ECHOCARDIOGRAM (TEE)  Patient Location: SICU  Anesthesia Type:General  Level of Consciousness: Patient remains intubated per anesthesia plan  Airway & Oxygen Therapy: Patient remains intubated per anesthesia plan and Patient placed on Ventilator (see vital sign flow sheet for setting)  Post-op Assessment: Report given to RN and Post -op Vital signs reviewed and stable  Post vital signs: Reviewed and stable  Last Vitals:  Vitals Value Taken Time  BP    Temp 35.9 C 02/11/22 1455  Pulse 85 02/11/22 1455  Resp 16 02/11/22 1455  SpO2 90 % 02/11/22 1455  Vitals shown include unvalidated device data.  Last Pain:  Vitals:   02/11/22 0606  TempSrc:   PainSc: 0-No pain         Complications: No notable events documented.

## 2022-02-11 NOTE — Anesthesia Procedure Notes (Addendum)
Central Venous Catheter Insertion Performed by: Oleta Mouse, MD, anesthesiologist Start/End10/17/2023 7:20 AM, 02/11/2022 7:28 AM Patient location: OR. Preanesthetic checklist: patient identified, IV checked, risks and benefits discussed, surgical consent, monitors and equipment checked, pre-op evaluation, timeout performed and anesthesia consent Patient sedated Hand hygiene performed  and maximum sterile barriers used  Catheter size: 9 Fr Total catheter length 10. MAC introducer Procedure performed using ultrasound guided technique. Ultrasound Notes:anatomy identified, needle tip was noted to be adjacent to the nerve/plexus identified, no ultrasound evidence of intravascular and/or intraneural injection and image(s) printed for medical record Attempts: 1 Following insertion, line sutured, dressing applied and Biopatch. Post procedure assessment: blood return through all ports, free fluid flow and no air  Patient tolerated the procedure well with no immediate complications.

## 2022-02-11 NOTE — Interval H&P Note (Signed)
History and Physical Interval Note:  02/11/2022 6:25 AM  Elaine Le  has presented today for surgery, with the diagnosis of SEVERE MR S/P MINI MVR.  The various methods of treatment have been discussed with the patient and family. After consideration of risks, benefits and other options for treatment, the patient has consented to  Procedure(s): REDO MITRAL VALVE REPLACEMENT (MVR) VIA STERNOTOMY (N/A) TRANSESOPHAGEAL ECHOCARDIOGRAM (TEE) (N/A) as a surgical intervention.  The patient's history has been reviewed, patient examined, no change in status, stable for surgery.  I have reviewed the patient's chart and labs.  Questions were answered to the patient's satisfaction.     [name]

## 2022-02-12 ENCOUNTER — Inpatient Hospital Stay (HOSPITAL_COMMUNITY): Payer: Medicare Other

## 2022-02-12 ENCOUNTER — Encounter (HOSPITAL_COMMUNITY): Payer: Self-pay | Admitting: Thoracic Surgery (Cardiothoracic Vascular Surgery)

## 2022-02-12 DIAGNOSIS — Z952 Presence of prosthetic heart valve: Secondary | ICD-10-CM

## 2022-02-12 DIAGNOSIS — J439 Emphysema, unspecified: Secondary | ICD-10-CM

## 2022-02-12 DIAGNOSIS — J4489 Other specified chronic obstructive pulmonary disease: Secondary | ICD-10-CM | POA: Diagnosis not present

## 2022-02-12 DIAGNOSIS — J9600 Acute respiratory failure, unspecified whether with hypoxia or hypercapnia: Secondary | ICD-10-CM

## 2022-02-12 LAB — GLUCOSE, CAPILLARY
Glucose-Capillary: 117 mg/dL — ABNORMAL HIGH (ref 70–99)
Glucose-Capillary: 121 mg/dL — ABNORMAL HIGH (ref 70–99)
Glucose-Capillary: 124 mg/dL — ABNORMAL HIGH (ref 70–99)
Glucose-Capillary: 134 mg/dL — ABNORMAL HIGH (ref 70–99)
Glucose-Capillary: 134 mg/dL — ABNORMAL HIGH (ref 70–99)
Glucose-Capillary: 138 mg/dL — ABNORMAL HIGH (ref 70–99)
Glucose-Capillary: 142 mg/dL — ABNORMAL HIGH (ref 70–99)
Glucose-Capillary: 153 mg/dL — ABNORMAL HIGH (ref 70–99)
Glucose-Capillary: 180 mg/dL — ABNORMAL HIGH (ref 70–99)
Glucose-Capillary: 55 mg/dL — ABNORMAL LOW (ref 70–99)
Glucose-Capillary: 56 mg/dL — ABNORMAL LOW (ref 70–99)

## 2022-02-12 LAB — BASIC METABOLIC PANEL
Anion gap: 7 (ref 5–15)
Anion gap: 6 (ref 5–15)
BUN: 6 mg/dL — ABNORMAL LOW (ref 8–23)
BUN: 5 mg/dL — ABNORMAL LOW (ref 8–23)
CO2: 23 mmol/L (ref 22–32)
CO2: 22 mmol/L (ref 22–32)
Calcium: 7.7 mg/dL — ABNORMAL LOW (ref 8.9–10.3)
Calcium: 7.6 mg/dL — ABNORMAL LOW (ref 8.9–10.3)
Chloride: 111 mmol/L (ref 98–111)
Chloride: 108 mmol/L (ref 98–111)
Creatinine, Ser: 0.64 mg/dL (ref 0.44–1.00)
Creatinine, Ser: 0.72 mg/dL (ref 0.44–1.00)
GFR, Estimated: >60 mL/min (ref >60)
GFR, Estimated: >60 mL/min (ref >60)
Glucose, Bld: 117 mg/dL — ABNORMAL HIGH (ref 70–99)
Glucose, Bld: 235 mg/dL — ABNORMAL HIGH (ref 70–99)
Potassium: 3.8 mmol/L (ref 3.5–5.1)
Potassium: 3.5 mmol/L (ref 3.5–5.1)
Sodium: 141 mmol/L (ref 135–145)
Sodium: 136 mmol/L (ref 135–145)

## 2022-02-12 LAB — CBC
HCT: 25.6 % — ABNORMAL LOW (ref 36.0–46.0)
HCT: 24 % — ABNORMAL LOW (ref 36.0–46.0)
Hemoglobin: 8.8 g/dL — ABNORMAL LOW (ref 12.0–15.0)
Hemoglobin: 8 g/dL — ABNORMAL LOW (ref 12.0–15.0)
MCH: 29 pg (ref 26.0–34.0)
MCH: 28.8 pg (ref 26.0–34.0)
MCHC: 34.4 g/dL (ref 30.0–36.0)
MCHC: 33.3 g/dL (ref 30.0–36.0)
MCV: 84.5 fL (ref 80.0–100.0)
MCV: 86.3 fL (ref 80.0–100.0)
Platelets: 136 10*3/uL — ABNORMAL LOW (ref 150–400)
Platelets: 140 10*3/uL — ABNORMAL LOW (ref 150–400)
RBC: 3.03 MIL/uL — ABNORMAL LOW (ref 3.87–5.11)
RBC: 2.78 MIL/uL — ABNORMAL LOW (ref 3.87–5.11)
RDW: 15.2 % (ref 11.5–15.5)
RDW: 15.6 % — ABNORMAL HIGH (ref 11.5–15.5)
WBC: 10.7 10*3/uL — ABNORMAL HIGH (ref 4.0–10.5)
WBC: 17.9 10*3/uL — ABNORMAL HIGH (ref 4.0–10.5)
nRBC: 0 % (ref 0.0–0.2)
nRBC: 0 % (ref 0.0–0.2)

## 2022-02-12 LAB — HEPATIC FUNCTION PANEL
ALT: 17 U/L (ref 0–44)
AST: 66 U/L — ABNORMAL HIGH (ref 15–41)
Albumin: 3.6 g/dL (ref 3.5–5.0)
Alkaline Phosphatase: 34 U/L — ABNORMAL LOW (ref 38–126)
Bilirubin, Direct: 0.2 mg/dL (ref 0.0–0.2)
Indirect Bilirubin: 0.6 mg/dL (ref 0.3–0.9)
Total Bilirubin: 0.8 mg/dL (ref 0.3–1.2)
Total Protein: 4.7 g/dL — ABNORMAL LOW (ref 6.5–8.1)

## 2022-02-12 LAB — COOXEMETRY PANEL
Carboxyhemoglobin: <0.3 % — ABNORMAL LOW (ref 0.5–1.5)
Carboxyhemoglobin: 1.5 % (ref 0.5–1.5)
Carboxyhemoglobin: 2.1 % — ABNORMAL HIGH (ref 0.5–1.5)
Methemoglobin: <0.7 % (ref 0.0–1.5)
Methemoglobin: 1.5 % (ref 0.0–1.5)
Methemoglobin: <0.7 % (ref 0.0–1.5)
O2 Saturation: 71.8 %
O2 Saturation: 68.6 %
O2 Saturation: 71.8 %
Total hemoglobin: 8.7 g/dL — ABNORMAL LOW (ref 12.0–16.0)
Total hemoglobin: 8.5 g/dL — ABNORMAL LOW (ref 12.0–16.0)
Total hemoglobin: 7.9 g/dL — ABNORMAL LOW (ref 12.0–16.0)

## 2022-02-12 LAB — MAGNESIUM
Magnesium: 3 mg/dL — ABNORMAL HIGH (ref 1.7–2.4)
Magnesium: 2.3 mg/dL (ref 1.7–2.4)

## 2022-02-12 LAB — POCT I-STAT 7, (LYTES, BLD GAS, ICA,H+H)
Acid-base deficit: 2 mmol/L (ref 0.0–2.0)
Bicarbonate: 22.8 mmol/L (ref 20.0–28.0)
Calcium, Ion: 1.13 mmol/L — ABNORMAL LOW (ref 1.15–1.40)
HCT: 23 % — ABNORMAL LOW (ref 36.0–46.0)
Hemoglobin: 7.8 g/dL — ABNORMAL LOW (ref 12.0–15.0)
O2 Saturation: 99 %
Patient temperature: 36.2
Potassium: 3.8 mmol/L (ref 3.5–5.1)
Sodium: 143 mmol/L (ref 135–145)
TCO2: 24 mmol/L (ref 22–32)
pCO2 arterial: 36.9 mmHg (ref 32–48)
pH, Arterial: 7.396 (ref 7.35–7.45)
pO2, Arterial: 140 mmHg — ABNORMAL HIGH (ref 83–108)

## 2022-02-12 MED ORDER — INSULIN ASPART 100 UNIT/ML IJ SOLN
0.0000 [IU] | Freq: Three times a day (TID) | INTRAMUSCULAR | Status: DC
  Administered 2022-02-12: 3 [IU] via SUBCUTANEOUS
  Administered 2022-02-13 – 2022-02-14 (×2): 2 [IU] via SUBCUTANEOUS
  Administered 2022-02-16: 4 [IU] via SUBCUTANEOUS
  Administered 2022-02-17: 3 [IU] via SUBCUTANEOUS
  Administered 2022-02-18: 2 [IU] via SUBCUTANEOUS

## 2022-02-12 MED ORDER — POTASSIUM CHLORIDE 10 MEQ/50ML IV SOLN
10.0000 meq | INTRAVENOUS | Status: AC
  Administered 2022-02-12 – 2022-02-13 (×3): 10 meq via INTRAVENOUS
  Filled 2022-02-12: qty 50

## 2022-02-12 MED ORDER — FUROSEMIDE 10 MG/ML IJ SOLN
20.0000 mg | Freq: Once | INTRAMUSCULAR | Status: AC
  Administered 2022-02-12: 20 mg via INTRAVENOUS
  Filled 2022-02-12: qty 2

## 2022-02-12 MED ORDER — INSULIN ASPART 100 UNIT/ML IJ SOLN: 0.0000 [IU] | Freq: Every day | INTRAMUSCULAR | Status: DC

## 2022-02-12 NOTE — Progress Notes (Signed)
Pt wakes up agitated but able to follow commands. Vitals per flowsheet, chest tube output minimal. Dr Lavonna Monarch notified. Will continue pt on vent with sedation over night.

## 2022-02-12 NOTE — Progress Notes (Signed)
EP asked to follow along by TCTS  Briefly, patient is POD 1 from redo MVR (mini MVR in 2017). She has narrow QRS at baseline, rec'd dose of metop 12.'5mg'$  10/17 pre-operatively. Remains on pressor support. Extubated this AM.   EP will follow along for consideration of PPM if conduction does not return.   All above discussed with Dr. Myles Gip, who is in agreement.   Mamie Levers, NP 02/12/22 11:59 AM

## 2022-02-12 NOTE — TOC Initial Note (Signed)
Transition of Care [facility]) - Initial/Assessment Note    Patient Details  Name: Elaine Le MRN: [id number] Date of Birth: 08/12/1960  Transition of Care [facility]) CM/SW Contact:    Bethena Roys, RN Phone Number: 02/12/2022, 11:17 AM  Clinical Narrative:  Patient POD-1 Redo MVR. PTA patient was independent from home alone. Patient states she has support of parents that are at the bedside. Patient has PCP and gets medications without any issues. Case Manager will continue to follow for additional transition of care needs as she progresses.                  Expected Discharge Plan: [place] Barriers to Discharge: Continued Medical Work up   Patient Goals and CMS Choice Patient states their goals for this hospitalization and ongoing recovery are:: to return home      Expected Discharge Plan and Services Expected Discharge Plan: [place] In-house Referral: NA Discharge Planning Services: CM Consult   Living arrangements for the past 2 months: Single Family Home                 DME Arranged: N/A    Prior Living Arrangements/Services Living arrangements for the past 2 months: Single Family Home Lives with:: Self (has support of parents and sister.) Patient language and need for interpreter reviewed:: Yes Do you feel safe going back to the place where you live?: Yes      Need for Family Participation in Patient Care: Yes (Comment) Care giver support system in place?: Yes (comment)   Criminal Activity/Legal Involvement Pertinent to Current Situation/Hospitalization: No - Comment as needed Permission Sought/Granted Permission sought to share information with : Family Supports, Case Manager   Emotional Assessment   Attitude/Demeanor/Rapport: Engaged Affect (typically observed): Appropriate Orientation: : Oriented to Self, Oriented to Place, Oriented to  Time, Oriented to Situation Alcohol / Substance Use: Not  Applicable Psych Involvement: No (comment)  Admission diagnosis:  S/P MVR (mitral valve replacement) [Z95.2] Patient Active Problem List   Diagnosis Date Noted   Mitral valve disease    URI (upper respiratory infection) 09/12/2020   Daytime somnolence 06/26/2020   History of heart failure 06/26/2020   Presence of prosthetic heart valve 06/26/2020   Unspecified abnormal finding in specimens from other organs, systems and tissues 06/26/2020   Vitamin D deficiency 06/26/2020   Chronic pain 06/26/2020   Bruising 06/26/2020   Rash 06/26/2020   Yeast vaginitis 06/26/2020   Family history of mitral valve repair 06/13/2020   Sleep disorder 04/26/2020   Globus pharyngeus 04/26/2020   Neck swelling 04/26/2020   Educated about COVID-19 virus infection 05/11/2019   Fever 02/18/2019   Mixed hyperlipidemia 01/28/2018   Statin intolerance 01/28/2018   Familial hypercholesterolemia 09/24/2017   Dyslipidemia 08/25/2017   Mucous retention cyst of maxillary sinus 04/01/2017   Chronic pansinusitis 02/10/2017   Deviated septum 02/10/2017   Nasal turbinate hypertrophy 02/10/2017   Rhinitis, chronic 02/10/2017   Dysuria 05/07/2016   Sinusitis, acute 11/23/2015   Encounter for well adult exam with abnormal findings 11/23/2015   Localized swelling of both lower legs R> L 05/07/2015   SOB (shortness of breath) 03/08/2015   Fatigue 03/08/2015   Hypothyroidism 02/14/2015   Weight gain 01/10/2015   Nasal polyps 11/30/2014   S/P MVR (mitral valve replacement) 11/17/2014   S/P mitral valve replacement 11/08/2014   Chronic diastolic heart failure ([place])    Coronary artery disease involving native coronary artery of  native heart without angina pectoris 07/28/2014   Chronic pain syndrome    Tobacco abuse    Acute on chronic respiratory failure with hypoxia (HCC)    Chronic obstructive pulmonary disease, unspecified ([place]) 07/17/2014   Severe mitral regurgitation 07/11/2014   Rheumatic disease of mitral  valve 07/10/2014   History of adenomatous polyp of colon 03/10/2011   IBS (irritable bowel syndrome) 01/21/2011   Gastroesophageal reflux disease without esophagitis 01/21/2011   Fibromyalgia 01/21/2011   PCP:  Biagio Borg, MD Pharmacy:   [id number] [place] [address] [place]Phone: [phone]: [phone]  Readmission Risk Interventions     No data to display

## 2022-02-12 NOTE — Progress Notes (Signed)
NAME:  Elaine Le, MRN:  [id number], DOB:  06-07-1960, LOS: 1 ADMISSION DATE:  02/11/2022, CONSULTATION DATE:  02/11/2022 REFERRING MD:  Yolanda Manges, CHIEF COMPLAINT:  post-operative care.   History of Present Illness:  61 year old woman who presented for elective redo-MVR.   She has rheumatic heart disease and had a bioprosthetic MVR in 2017.  In the last year however (since having COVID) she has developed NYHA II-III dyspnea and lower extremity edema. Denied chest pain.  Work-up revealed severe MR with moderate PAH (sPAP 52)   She has seen Dr Shearon Stalls for COPD. Last seen in 09/2021.  PFT's showed moderate airflow obstruction.  She has OSA uses CPAP  Pertinent  Medical History   Past Medical History:  Diagnosis Date   Acute respiratory failure with hypercapnia ([place]) 40/98/1191   Acute systolic heart failure ([place]) 09/2014   Adenomatous colon polyp    Arthritis    Back pain    Chronic diastolic heart failure (HCC)    related to MR/MS   Chronic pain syndrome    Chronic sinusitis    COPD (chronic obstructive pulmonary disease)-PFT pending  07/17/2014   Diverticulosis    Dyspnea    Fibromyalgia    GERD (gastroesophageal reflux disease)    Headaches, cluster    Heart murmur    Hiatal hernia    Hx of adenomatous colonic polyps 03/10/2011   Oct 2012, repeat colon Oct 2017    Hypothyroidism    Internal hemorrhoids    Irritable bowel syndrome (IBS)    Mitral valve regurgitation    severe   Mitral valve stenosis, moderate 07/11/2014   Pneumonia    Feb. 24, 2016   Protein-calorie malnutrition, severe ([place]) 07/06/2014   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/10/2014   Severe mitral regurgitation with moderate mitral stenosis   S/P minimally invasive mitral valve replacement with bioprosthetic valve 11/08/2014   25 mm [facility] Mitral bovine bioprosthetic tissue valve placed via right mini thoracotomy approach   Sleep apnea    uses CPAP-setting 7   Tobacco abuse     Significant Hospital Events: Including procedures, antibiotic start and stop dates in addition to other pertinent events   10/17  Redo Mitral Valve replacement initially  using a 25 mm Mitris Pericardial valve which needed removal and placement of a 27 mm Mosaic Porcine valve  Interim History / Subjective:  On sbt doing well On 2 epi, 4 mcg neo 0.3 milrinone 0.1 precedex AO following commands  Objective   Blood pressure 113/68, pulse 85, temperature (!) 96.4 F (35.8 C), resp. rate 20, height 5' (1.524 m), weight 59.4 kg, SpO2 100 %. PAP: (27)/(17) 27/17 CVP:  [13 mmHg-23 mmHg] 21 mmHg CO:  [3.7 L/min] 3.7 L/min CI:  [2.5 L/min/m2] 2.5 L/min/m2  Vent Mode: PRVC;SIMV FiO2 (%):  [50 %-70 %] 50 % Set Rate:  [16 bmp-20 bmp] 20 bmp Vt Set:  [400 mL] 400 mL PEEP:  [5 cmH20-8 cmH20] 8 cmH20 Pressure Support:  [10 cmH20] 10 cmH20 Plateau Pressure:  [17 cmH20-19 cmH20] 19 cmH20   Intake/Output Summary (Last 24 hours) at 02/12/2022 0709 Last data filed at 02/12/2022 0600 Gross per 24 hour  Intake 6076.82 ml  Output 5165 ml  Net 911.82 ml    Filed Weights   02/11/22 0559 02/12/22 0400  Weight: 50.8 kg 59.4 kg   Examination: General:   ill appearing on mech vent HEENT: MM pink/moist; ETT in place Neuro: Ao; MAE CV: s1s2, AV  paced rate 80s PULM:  dim clear BS bilaterally; on mech vent PSV GI: soft, bsx4 active  Extremities: warm/dry, no edema  Skin: no rashes or lesions appreciated   Ancillary tests personally reviewed:  PFT's personally reviewed shows mixed restrictive and obstructive pattern with no bronchodilator change.  No gas trapping CT chest shows diaphragmatic flattening with emphysematous changes at the apices.  HB 7.8  Assessment & Plan:   Critically ill due to cardiogenic and distributive shock (expected) following redo MVR. P: -TCTS following -wean epi/neo for map goal >65 -cont milrinone per tcts -CT management per protocol -cont ASA -cont AV  pacing -cont cefazolin post procedure -lasix x1 -wean off insulin gtt per protocol -prn pain meds  Critically ill due to postoperative mechanical ventilation  P: -passed SBT -will extubate -wean fio2 for sats >92% -pulm toiletry: IS -OOB and PT when appropriate  GOLD stage II (moderate) COPD - well controlled on prn albuterol only  P: -prn duoneb for wheezing -denies current tobacco use   OSA  P: -cpap qhs and prn  Anemia Thrombocytopenia P: -trend CBC  Hypothyroidism P: - Continue home liothyroxine and levothyroxine.  Best Practice (right click and "Reselect all SmartList Selections" daily)   Diet/type: NPO DVT prophylaxis: SCD GI prophylaxis: N/A Lines: Central line, Arterial Line, and yes and it is still needed Foley:  Yes, and it is still needed Code Status:  full code Last date of multidisciplinary goals of care discussion [updated patient at bedside]  CRITICAL CARE Performed by: Mick Sell   Total critical care time: 35 minutes  JD Rexene Agent [facility] 02/12/2022, 9:10 AM  Please see [email or web address] for pager details.  From 7A-7P if no response, please call [phone]. After hours, please call ELink [phone].

## 2022-02-12 NOTE — Progress Notes (Signed)
[address]       [place] [address]             [phone]      1 Day Post-Op  Procedure(s) (LRB): REDO MITRAL VALVE REPLACEMENT (MVR) VIA STERNOTOMY USING 27 MM MOSAIC PORCINE MITRAL VALVE (N/A) TRANSESOPHAGEAL ECHOCARDIOGRAM (TEE) (N/A)   Total Length of Stay:  LOS: 1 day    SUBJECTIVE Awake and alert.  Still intubated but on wean currently No reliable rhythm under pacer Vitals:   02/12/22 0600 02/12/22 0700  BP: 113/68 119/71  Pulse: 85 86  Resp: 20 20  Temp: (!) 96.4 F (35.8 C) (!) 96.1 F (35.6 C)  SpO2: 100% 99%    Intake/Output      10/17 0701 10/18 0700 10/18 0701 10/19 0700   I.V. (mL/kg) 3397.9 (57.2)    Blood 545    NG/GT 60    IV Piggyback 2073.9    Total Intake(mL/kg) 6076.8 (102.3)    Urine (mL/kg/hr) 3635 (2.5)    Blood 1090    Chest Tube 440    Total Output 5165    Net +911.8             sodium chloride     sodium chloride     sodium chloride 10 mL/hr at 02/12/22 0600   albumin human 60 mL/hr at 02/12/22 0600    ceFAZolin (ANCEF) IV 2 g (02/12/22 0649)   dexmedetomidine (PRECEDEX) IV infusion 0.4 mcg/kg/hr (02/12/22 0600)   epinephrine 2 mcg/min (02/12/22 0600)   insulin 1.4 Units/hr (02/12/22 0600)   lactated ringers     lactated ringers     lactated ringers 20 mL/hr at 02/12/22 0600   milrinone 0.3 mcg/kg/min (02/12/22 0600)   nitroGLYCERIN     phenylephrine (NEO-SYNEPHRINE) Adult infusion 4 mcg/min (02/12/22 0600)    CBC    Component Value Date/Time   WBC 10.7 (H) 02/12/2022 0507   RBC 3.03 (L) 02/12/2022 0507   HGB 8.8 (L) 02/12/2022 0507   HGB 12.5 12/23/2021 1117   HCT 25.6 (L) 02/12/2022 0507   HCT 37.4 12/23/2021 1117   PLT 136 (L) 02/12/2022 0507   PLT 255 12/23/2021 1117   MCV 84.5 02/12/2022 0507   MCV 86 12/23/2021 1117   MCH 29.0 02/12/2022 0507   MCHC 34.4 02/12/2022 0507   RDW 15.2 02/12/2022 0507   RDW 12.6 12/23/2021 1117   LYMPHSABS 1.5 06/26/2020 1124   MONOABS 0.4  06/26/2020 1124   EOSABS 0.1 06/26/2020 1124   BASOSABS 0.1 06/26/2020 1124   CMP     Component Value Date/Time   NA 141 02/12/2022 0507   NA 141 12/23/2021 1117   K 3.8 02/12/2022 0507   CL 111 02/12/2022 0507   CO2 23 02/12/2022 0507   GLUCOSE 117 (H) 02/12/2022 0507   BUN 6 (L) 02/12/2022 0507   BUN 7 (L) 12/23/2021 1117   CREATININE 0.64 02/12/2022 0507   CALCIUM 7.7 (L) 02/12/2022 0507   PROT 6.2 (L) 02/07/2022 1042   ALBUMIN 3.8 02/07/2022 1042   AST 28 02/07/2022 1042   ALT 26 02/07/2022 1042   ALKPHOS 85 02/07/2022 1042   BILITOT 0.8 02/07/2022 1042   GFRNONAA >60 02/12/2022 0507   GFRAA >60 04/02/2019 2059   ABG    Component Value Date/Time   PHART 7.396 02/12/2022 0501   PCO2ART 36.9 02/12/2022 0501   PO2ART 140 (H) 02/12/2022 0501   HCO3 22.8 02/12/2022 0501   TCO2  24 02/12/2022 0501   ACIDBASEDEF 2.0 02/12/2022 0501   O2SAT 99 02/12/2022 0501   CBG (last 3)  Recent Labs    02/12/22 0157 02/12/22 0255 02/12/22 0459  GLUCAP 134* 121* 117*   EXAM Lungs: overall clear Card: RR soft systolic murmur Ext: warm  ASSESSMENT: POD# 1 SP Redo MVR with tissue prothesis and repair of AV dissociation Wean to extubate this am Will give lasix this am Hemodynamics ok on Epi and Milrinone and Neo Will wean milrinone today AV block- will make EP aware. Not surprised with needing AV dissociation repair Hold on anticoagulation today    [name], MD '@DATE'$ @

## 2022-02-12 NOTE — Progress Notes (Addendum)
CT surgery PM rounds  Patient stable postop day 1 redo mitral valve replacement. The patient is extubated with stable pulmonary function. She is AV sequentially paced for a-v block Hemodynamics stable on low-dose inotropic support with PM coox 70%. P.m. hemoglobin 8.0 Urine output decreased from am dose lasix - will repeat 20 mg iv Output good, net fluid balance -1300  Blood pressure (!) 81/59, pulse 86, temperature 98.3 F (36.8 C), temperature source Oral, resp. rate (!) 22, height 5' (1.524 m), weight 59.4 kg, SpO2 93 %.

## 2022-02-12 NOTE — Procedures (Signed)
Extubation Procedure Note  Patient Details:   Name: NAO LINZ DOB: 1960-11-08 MRN: [id number]   Airway Documentation:    Vent end date: 02/12/22 Vent end time: 0904   Evaluation  O2 sats: stable throughout Complications: No apparent complications Patient did tolerate procedure well. Bilateral Breath Sounds: Clear, Diminished   Yes Extubated to nebulizer BBS equal clear 4Lmin post treatment Incentive spirometer instructed  Revonda Standard 02/12/2022, 9:06 AM

## 2022-02-12 NOTE — Progress Notes (Signed)
CPAP in room and made available to pt. Pt refused '@this'$  time. Will monitor and apply if required.

## 2022-02-13 DIAGNOSIS — I442 Atrioventricular block, complete: Secondary | ICD-10-CM | POA: Diagnosis not present

## 2022-02-13 DIAGNOSIS — Z952 Presence of prosthetic heart valve: Secondary | ICD-10-CM

## 2022-02-13 DIAGNOSIS — J9600 Acute respiratory failure, unspecified whether with hypoxia or hypercapnia: Secondary | ICD-10-CM | POA: Diagnosis not present

## 2022-02-13 LAB — COMPREHENSIVE METABOLIC PANEL
ALT: 14 U/L (ref 0–44)
AST: 48 U/L — ABNORMAL HIGH (ref 15–41)
Albumin: 3 g/dL — ABNORMAL LOW (ref 3.5–5.0)
Alkaline Phosphatase: 47 U/L (ref 38–126)
Anion gap: 7 (ref 5–15)
BUN: 5 mg/dL — ABNORMAL LOW (ref 8–23)
CO2: 24 mmol/L (ref 22–32)
Calcium: 7.7 mg/dL — ABNORMAL LOW (ref 8.9–10.3)
Chloride: 105 mmol/L (ref 98–111)
Creatinine, Ser: 0.62 mg/dL (ref 0.44–1.00)
GFR, Estimated: >60 mL/min (ref >60)
Glucose, Bld: 128 mg/dL — ABNORMAL HIGH (ref 70–99)
Potassium: 4.7 mmol/L (ref 3.5–5.1)
Sodium: 136 mmol/L (ref 135–145)
Total Bilirubin: 0.5 mg/dL (ref 0.3–1.2)
Total Protein: 4.3 g/dL — ABNORMAL LOW (ref 6.5–8.1)

## 2022-02-13 LAB — CBC
HCT: 23.8 % — ABNORMAL LOW (ref 36.0–46.0)
Hemoglobin: 8.1 g/dL — ABNORMAL LOW (ref 12.0–15.0)
MCH: 29.5 pg (ref 26.0–34.0)
MCHC: 34 g/dL (ref 30.0–36.0)
MCV: 86.5 fL (ref 80.0–100.0)
Platelets: 111 10*3/uL — ABNORMAL LOW (ref 150–400)
RBC: 2.75 MIL/uL — ABNORMAL LOW (ref 3.87–5.11)
RDW: 15.5 % (ref 11.5–15.5)
WBC: 13.5 10*3/uL — ABNORMAL HIGH (ref 4.0–10.5)
nRBC: 0 % (ref 0.0–0.2)

## 2022-02-13 LAB — COOXEMETRY PANEL
Carboxyhemoglobin: 1.9 % — ABNORMAL HIGH (ref 0.5–1.5)
Methemoglobin: 0.7 % (ref 0.0–1.5)
O2 Saturation: 66 %
Total hemoglobin: 8.4 g/dL — ABNORMAL LOW (ref 12.0–16.0)

## 2022-02-13 LAB — GLUCOSE, CAPILLARY
Glucose-Capillary: 162 mg/dL — ABNORMAL HIGH (ref 70–99)
Glucose-Capillary: 128 mg/dL — ABNORMAL HIGH (ref 70–99)
Glucose-Capillary: 112 mg/dL — ABNORMAL HIGH (ref 70–99)
Glucose-Capillary: 104 mg/dL — ABNORMAL HIGH (ref 70–99)
Glucose-Capillary: 128 mg/dL — ABNORMAL HIGH (ref 70–99)

## 2022-02-13 LAB — MAGNESIUM: Magnesium: 2.2 mg/dL (ref 1.7–2.4)

## 2022-02-13 MED ORDER — FUROSEMIDE 10 MG/ML IJ SOLN
40.0000 mg | Freq: Once | INTRAMUSCULAR | Status: AC
  Administered 2022-02-13: 40 mg via INTRAVENOUS
  Filled 2022-02-13: qty 4

## 2022-02-13 MED ORDER — ALUM & MAG HYDROXIDE-SIMETH 200-200-20 MG/5ML PO SUSP
30.0000 mL | ORAL | Status: DC | PRN
  Administered 2022-02-13 – 2022-02-20 (×9): 30 mL via ORAL
  Filled 2022-02-13 (×9): qty 30

## 2022-02-13 MED ORDER — ORAL CARE MOUTH RINSE: 15.0000 mL | OROMUCOSAL | Status: DC | PRN

## 2022-02-13 MED FILL — Albumin, Human Inj 5%: INTRAVENOUS | Qty: 250 | Status: AC

## 2022-02-13 MED FILL — Heparin Sodium (Porcine) Inj 1000 Unit/ML: Qty: 1000 | Status: AC

## 2022-02-13 MED FILL — Lidocaine HCl Local Preservative Free (PF) Inj 2%: INTRAMUSCULAR | Qty: 14 | Status: AC

## 2022-02-13 MED FILL — Potassium Chloride Inj 2 mEq/ML: INTRAVENOUS | Qty: 40 | Status: AC

## 2022-02-13 MED FILL — Sodium Chloride IV Soln 0.9%: INTRAVENOUS | Qty: 3000 | Status: AC

## 2022-02-13 MED FILL — Sodium Bicarbonate IV Soln 8.4%: INTRAVENOUS | Qty: 50 | Status: AC

## 2022-02-13 MED FILL — Heparin Sodium (Porcine) Inj 1000 Unit/ML: INTRAMUSCULAR | Qty: 20 | Status: AC

## 2022-02-13 MED FILL — Electrolyte-R (PH 7.4) Solution: INTRAVENOUS | Qty: 6000 | Status: AC

## 2022-02-13 NOTE — Discharge Instructions (Addendum)
Please resume home Lasix 20 mg daily prn once you have completed your 7 day Lasix 40 mg regimen... You will only need to take potassium on days you take Lasix   Discharge Instructions:  1. You may shower, please wash incisions daily with soap and water and keep dry.  If you wish to cover wounds with dressing you may do so but please keep clean and change daily.  No tub baths or swimming until incisions have completely healed.  If your incisions become red or develop any drainage please call our office at [phone]  2. No Driving until cleared by Dr. Nicholos Johns office and you are no longer using narcotic pain medications  3. Monitor your weight daily.. Please use the same scale and weigh at same time... If you gain 5-10 lbs in 48 hours with associated lower extremity swelling, please contact our office at [phone]  4. Fever of 101.5 for at least 24 hours with no source, please contact our office at [phone]  5. Activity- up as tolerated, please walk at least 3 times per day.  Avoid strenuous activity, no lifting, pushing, or pulling with your arms over 8-10 lbs for a minimum of 6 weeks  6. If any questions or concerns arise, please do not hesitate to contact our office at [phone]   After Your Pacemaker   You have a Biotronik Pacemaker  ACTIVITY Do not lift your arm above shoulder height for 1 week after your procedure. After 7 days, you may progress as below.  You should remove your sling 24 hours after your procedure, unless otherwise instructed by your provider.     Tuesday February 25, 2022  Wednesday February 26, 2022 Thursday February 27, 2022 Friday February 28, 2022   Do not lift, push, pull, or carry anything over 10 pounds with the affected arm until 6 weeks (Tuesday April 01, 2022 ) after your procedure.   You may drive AFTER your wound check, unless you have been told otherwise by your provider.   Ask your healthcare provider when you can go back to  work   INCISION/Dressing If you are on a blood thinner such as Coumadin, Xarelto, Eliquis, Plavix, or Pradaxa please confirm with your provider when this should be resumed.   If large square, outer bandage is left in place, this can be removed after 24 hours from your procedure. Do not remove steri-strips or glue as below.   Monitor your Pacemaker site for redness, swelling, and drainage. Call the device clinic at [phone] if you experience these symptoms or fever/chills.  If your incision is closed with Dermabond/Surgical glue. You may shower 1 day after your pacemaker implant and wash around the site with soap and water.    If you were discharged in a sling, please do not wear this during the day more than 48 hours after your surgery unless otherwise instructed. This may increase the risk of stiffness and soreness in your shoulder.   Avoid lotions, ointments, or perfumes over your incision until it is well-healed.  You may use a hot tub or a pool AFTER your wound check appointment if the incision is completely closed.  Pacemaker Alerts:  Some alerts are vibratory and others beep. These are NOT emergencies. Please call our office to let us know. If this occurs at night or on weekends, it can wait until the next business day. Send a remote transmission.  If your device is capable of reading fluid status (for heart failure), you will be  offered monthly monitoring to review this with you.   DEVICE MANAGEMENT Remote monitoring is used to monitor your pacemaker from home. This monitoring is scheduled every 91 days by our office. It allows [place] to keep an eye on the functioning of your device to ensure it is working properly. You will routinely see your Electrophysiologist annually (more often if necessary).   You should receive your ID card for your new device in 4-8 weeks. Keep this card with you at all times once received. Consider wearing a medical alert bracelet or necklace.  Your  Pacemaker may be MRI compatible. This will be discussed at your next office visit/wound check.  You should avoid contact with strong electric or magnetic fields.   Do not use amateur (ham) radio equipment or electric (arc) welding torches. MP3 player headphones with magnets should not be used. Some devices are safe to use if held at least 12 inches (30 cm) from your Pacemaker. These include power tools, lawn mowers, and speakers. If you are unsure if something is safe to use, ask your health care provider.  When using your cell phone, hold it to the ear that is on the opposite side from the Pacemaker. Do not leave your cell phone in a pocket over the Pacemaker.  You may safely use electric blankets, heating pads, computers, and microwave ovens.  Call the office right away if: You have chest pain. You feel more short of breath than you have felt before. You feel more light-headed than you have felt before. Your incision starts to open up.  This information is not intended to replace advice given to you by your health care provider. Make sure you discuss any questions you have with your health care provider.   Information on my medicine - ELIQUIS (apixaban)   Why was Eliquis prescribed for you? Eliquis was prescribed for you to reduce the risk of forming blood clots that can cause a stroke if you have a medical condition called atrial fibrillation (a type of irregular heartbeat) OR to reduce the risk of a blood clots forming after orthopedic surgery.  What do You need to know about Eliquis ? Take your Eliquis TWICE DAILY - one tablet in the morning and one tablet in the evening with or without food.  It would be best to take the doses about the same time each day.  If you have difficulty swallowing the tablet whole please discuss with your pharmacist how to take the medication safely.  Take Eliquis exactly as prescribed by your doctor and DO NOT stop taking Eliquis without talking to  the doctor who prescribed the medication.  Stopping may increase your risk of developing a new clot or stroke.  Refill your prescription before you run out.  After discharge, you should have regular check-up appointments with your healthcare provider that is prescribing your Eliquis.  In the future your dose may need to be changed if your kidney function or weight changes by a significant amount or as you get older.  What do you do if you miss a dose? If you miss a dose, take it as soon as you remember on the same day and resume taking twice daily.  Do not take more than one dose of ELIQUIS at the same time.  Important Safety Information A possible side effect of Eliquis is bleeding. You should call your healthcare provider right away if you experience any of the following: Bleeding from an injury or your nose that does not  stop. Unusual colored urine (red or dark brown) or unusual colored stools (red or black). Unusual bruising for unknown reasons. A serious fall or if you hit your head (even if there is no bleeding).  Some medicines may interact with Eliquis and might increase your risk of bleeding or clotting while on Eliquis. To help avoid this, consult your healthcare provider or pharmacist prior to using any new prescription or non-prescription medications, including herbals, vitamins, non-steroidal anti-inflammatory drugs (NSAIDs) and supplements.  This website has more information on Eliquis (apixaban): [email or web address].no.

## 2022-02-13 NOTE — Anesthesia Postprocedure Evaluation (Signed)
Anesthesia Post Note  Patient: Elaine Le  Procedure(s) Performed: REDO MITRAL VALVE REPLACEMENT (MVR) VIA STERNOTOMY USING 27 MM MOSAIC PORCINE MITRAL VALVE (Chest) TRANSESOPHAGEAL ECHOCARDIOGRAM (TEE)     Patient location during evaluation: SICU Anesthesia Type: General Level of consciousness: sedated Pain management: pain level controlled Vital Signs Assessment: post-procedure vital signs reviewed and stable Respiratory status: patient remains intubated per anesthesia plan Cardiovascular status: stable Postop Assessment: no apparent nausea or vomiting Anesthetic complications: no   No notable events documented.  Last Vitals:  Vitals:   02/13/22 0831 02/13/22 0845  BP:    Pulse: 84 84  Resp: 14 20  Temp:    SpO2: 96% 96%    Last Pain:  Vitals:   02/13/22 0831  TempSrc:   PainSc: Asleep                 Joplin Canty

## 2022-02-13 NOTE — Discharge Summary (Signed)
[address]       [place] [address]             [phone]    Physician Discharge Summary  Patient ID: Elaine Le MRN: [id number] DOB/AGE: [birth year] 61 y.o.  Admit date: 02/11/2022 Discharge date: 02/21/2022  Admission Diagnoses:  Patient Active Problem List   Diagnosis Date Noted   Mitral valve disease    URI (upper respiratory infection) 09/12/2020   Daytime somnolence 06/26/2020   History of heart failure 06/26/2020   Presence of prosthetic heart valve 06/26/2020   Unspecified abnormal finding in specimens from other organs, systems and tissues 06/26/2020   Vitamin D deficiency 06/26/2020   Chronic pain 06/26/2020   Bruising 06/26/2020   Rash 06/26/2020   Yeast vaginitis 06/26/2020   Family history of mitral valve repair 06/13/2020   Sleep disorder 04/26/2020   Globus pharyngeus 04/26/2020   Neck swelling 04/26/2020   Educated about COVID-19 virus infection 05/11/2019   Fever 02/18/2019   Mixed hyperlipidemia 01/28/2018   Statin intolerance 01/28/2018   Familial hypercholesterolemia 09/24/2017   Dyslipidemia 08/25/2017   Mucous retention cyst of maxillary sinus 04/01/2017   Chronic pansinusitis 02/10/2017   Deviated septum 02/10/2017   Nasal turbinate hypertrophy 02/10/2017   Rhinitis, chronic 02/10/2017   Dysuria 05/07/2016   Sinusitis, acute 11/23/2015   Encounter for well adult exam with abnormal findings 11/23/2015   Localized swelling of both lower legs R> L 05/07/2015   SOB (shortness of breath) 03/08/2015   Fatigue 03/08/2015   Hypothyroidism 02/14/2015   Weight gain 01/10/2015   Nasal polyps 11/30/2014   S/P MVR (mitral valve replacement) 11/17/2014   S/P mitral valve replacement 11/08/2014   Chronic diastolic heart failure ([place])    Coronary artery disease involving native coronary artery of native heart without angina pectoris 07/28/2014   Chronic pain syndrome    Tobacco abuse    Acute on chronic respiratory  failure with hypoxia (HCC)    Chronic obstructive pulmonary disease, unspecified ([place]) 07/17/2014   Severe mitral regurgitation 07/11/2014   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/10/2014   History of adenomatous polyp of colon 03/10/2011   IBS (irritable bowel syndrome) 01/21/2011   Gastroesophageal reflux disease without esophagitis 01/21/2011   Fibromyalgia 01/21/2011   Discharge Diagnoses:  Patient Active Problem List   Diagnosis Date Noted   Cardiogenic shock ([place]) 02/16/2022   Sternotomy with Redo Mitral Valve Replacement 02/13/2022   Mitral valve disease    URI (upper respiratory infection) 09/12/2020   Daytime somnolence 06/26/2020   History of heart failure 06/26/2020   Presence of prosthetic heart valve 06/26/2020   Unspecified abnormal finding in specimens from other organs, systems and tissues 06/26/2020   Vitamin D deficiency 06/26/2020   Chronic pain 06/26/2020   Bruising 06/26/2020   Rash 06/26/2020   Yeast vaginitis 06/26/2020   Family history of mitral valve repair 06/13/2020   Sleep disorder 04/26/2020   Globus pharyngeus 04/26/2020   Neck swelling 04/26/2020   Educated about COVID-19 virus infection 05/11/2019   Fever 02/18/2019   Mixed hyperlipidemia 01/28/2018   Statin intolerance 01/28/2018   Familial hypercholesterolemia 09/24/2017   Dyslipidemia 08/25/2017   Mucous retention cyst of maxillary sinus 04/01/2017   Chronic pansinusitis 02/10/2017   Deviated septum 02/10/2017   Nasal turbinate hypertrophy 02/10/2017   Rhinitis, chronic 02/10/2017   Dysuria 05/07/2016   Sinusitis, acute 11/23/2015   Encounter for well adult exam with abnormal findings 11/23/2015  Localized swelling of both lower legs R> L 05/07/2015   SOB (shortness of breath) 03/08/2015   Fatigue 03/08/2015   Hypothyroidism 02/14/2015   Weight gain 01/10/2015   Nasal polyps 11/30/2014   S/P mitral valve replacement 11/08/2014   Chronic diastolic heart failure (HCC)    Coronary  artery disease involving native coronary artery of native heart without angina pectoris 07/28/2014   Chronic pain syndrome    Tobacco abuse    Acute on chronic respiratory failure with hypoxia (HCC)    Chronic obstructive pulmonary disease, unspecified ([place]) 07/17/2014   Severe mitral regurgitation 07/11/2014   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/10/2014   History of adenomatous polyp of colon 03/10/2011   IBS (irritable bowel syndrome) 01/21/2011   Gastroesophageal reflux disease without esophagitis 01/21/2011   Fibromyalgia 01/21/2011   Discharged Condition: good  HPI: Elaine Le is a very pleasant 61 yo wf who has a 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 Pt had an uneventful post operative recovery except for developing hives on the lower extremeties due to coumadin which resolved after it was discontinued. Pt has been doing well up to the past year when she developed COVID and from that point describes an acute change in her ability to walk distances as before needing to stop due to DOE. She has good days and bad days with the bad days needing to stop and rest after a flight of stairs or over 50 to 100 yrds. She has some palpitations but no lower ext edema. She has no CP. She had an echo 2/23 with now new severe MR and MS. This has been confirmed again with repeat echo this August. She has been followed but has felt that she should be considering reoperation and to that end was taken to the cath lab where PA pressures were moderately elevated with SPAP 25mHg. She had no evidence of CAD. Pt now presents for discussion of options moving forward.   Dr. WLavonna Monarchreviewed the patient's chart, labs and diagnostic studies and determined surgical redo mitral valve replacement would provide this patient with the best long term treatment. He discussed her treatment options as well as risks and  benefits of surgery. Elaine Le agreeable to proceed with surgery.  [facility]. She underwent a redo mitral valve replacement initially with a 25 mm Mitris pericardial valve which was removed and replaced with a 238mMosaic porcine valve.  The patient tolerated the procedure and was taken to the SICU in stable condition.  The patient was weaned and extubated on POD #1.  She was pacer dependent and EP consult was obtained.  They will monitor patient for signs of conduction return.  She was weaned off Epinephrine, Milrinone, and Phenylephrine as hemodynamics allowed.  The patient was started on diuretics for volume overloaded state.  Her arterial and central line were removed without difficulty.  She had mild hyponatremia due to diuretics.  The patient's native conduction did not return.  She would be monitored over the weekend and her temporary pacing had to be adjusted.  Her conduction did not recover and she underwent placement of PPM 02/17/2022. She tolerated the procedure well. Her epicardial pacing wires were removed without complication. Her foley was removed routinely. She was started on a low dose beta blocker that was titrated as able.  Eliquis was started for her bioprosthetic valve. She was felt stable for transfer to the progressive unit. Her home levothyroxine was restarted for hypothyroidism. She remained fluid overloaded with small pleural effusions so she was aggressively diuresed. She experienced nightly dyspnea with desaturation, uses CPAP at home but refused CPAP in hospital. She experienced some new swelling in her LUE that was closely monitored. She underwent a fall onto her bottom as she was sitting on the foot rest of the recliner and it collapsed from under her. No injuries were noted. She responded well to aggressive diuretics.  She was resumed on Eliquis prior to discharge.  She is  off oxygen.  She is ambulating independently.  Her surgical incisions are healing without evidence of infection.  She is medically stable for discharge home today.  Consults:  EP  Significant Diagnostic Studies: cardiac graphics:   Echocardiogram:    IMPRESSIONS    1. 25 mm bioprosthesis. MG 14 @ 78 bpm. Emax 2.5 m/s, EOA 1.14 cm2, DVI  2.6, PHT 72 ms. All suggest severe regurgitation. Based on prior TEE, MR  is severe. All parameters point to severe MR to explain elevated gradient.  The mitral valve has been  repaired/replaced. Severe mitral valve regurgitation. There is a 25 mm  Edwards Magna bioprosthetic valve present in the mitral position.  Procedure Date: 11/08/2014.   2. Left ventricular ejection fraction, by estimation, is 65 to 70%. The  left ventricle has normal function. The left ventricle has no regional  wall motion abnormalities. Left ventricular diastolic function could not  be evaluated.   3. Right ventricular systolic function is normal. The right ventricular  size is normal. Tricuspid regurgitation signal is inadequate for assessing  PA pressure.   4. Left atrial size was mild to moderately dilated.   5. The aortic valve is tricuspid. Aortic valve regurgitation is not  visualized. No aortic stenosis is present.   6. The inferior vena cava is normal in size with greater than 50%  respiratory variability, suggesting right atrial pressure of 3 mmHg.   Comparison(s): No significant change from prior study. MR remains severe.   Treatments: surgery:   02/11/2022 Ethelda Chick [id number]   Surgeon:  Everlena Cooper, MD   First Assistant: Jadene Pierini [facility]     Preoperative Diagnosis:  Severe Mitral Prosthetic Stenosis     Postoperative Diagnosis:  Same     Procedure:   Median Sternotomy Extracorporeal circulation 3.   Redo Mitral Valve replacement initially  using a 25 mm Mitris Pericardial valve which needed removal and placement of a 27 mm Mosaic  Porcine valve (SN [id number])   Discharge Exam: Blood pressure (!) 112/53, pulse 84, temperature 98.6 F (37 C), temperature source Oral, resp. rate 19, height 5' (1.524 m), weight 54.4 kg, SpO2 100 %.  General appearance: alert, cooperative, and no distress Heart: regular rate and rhythm and paced Lungs: clear to auscultation bilaterally Abdomen: soft, non-tender; bowel sounds normal; no masses,  no organomegaly Extremities: edema trace, LUE improved, ecchymosis persist Wound: clean and dry   Discharge Medications:  The patient has been discharged on:   1.Beta Blocker:  Yes [ X  ]                              No   [   ]  If No, reason:  2.Ace Inhibitor/ARB: Yes [   ]                                     No  [ x   ]                                     If No, reason: labile BP  3.Statin:   Yes [   ]                  No  [ X  ]                  If No, reason: Intolerance  4.[place]:  Yes  [ x  ]                  No   [   ]                  If No, reason:  Patient had ACS upon admission: NO  Plavix/P2Y12 inhibitor: Yes [   ]                                      No  [ x  ]     Discharge Instructions     Amb Referral to Cardiac Rehabilitation   Complete by: As directed    Diagnosis: Valve Replacement   Valve: Mitral   After initial evaluation and assessments completed: Virtual Based Care may be provided alone or in conjunction with Phase 2 Cardiac Rehab based on patient barriers.: Yes   Intensive Cardiac Rehabilitation (ICR) [place] location only OR Traditional Cardiac Rehabilitation (TCR) *If criteria for ICR are not met will enroll in TCR [facility] only): Yes      Allergies as of 02/21/2022       Reactions   Estrace [estradiol] Hives   Name Brand can be used    Reglan [metoclopramide] Hives   Warfarin And Related Hives, Rash   Other Hives, Other (See Comments)   Any antidepressants per pt- causes altered mental state, anger Other reaction(s):  rash   Morphine And Related Other (See Comments)   Altered mental state-Patient reports she "made satan look sweet"    Singulair [montelukast Sodium] Other (See Comments)   Intolerance - effects her mood    Statins Other (See Comments)   Myalgias with several statins        Medication List     TAKE these medications    acetaminophen 325 MG tablet Commonly known as: TYLENOL Take 1-2 tablets (325-650 mg total) by mouth every 4 (four) hours as needed for mild pain.   albuterol 108 (90 Base) MCG/ACT inhaler Commonly known as: VENTOLIN HFA Inhale 2 puffs into the lungs every 6 (six) hours as needed.   apixaban 5 MG Tabs tablet Commonly known as: ELIQUIS Take 1 tablet (5 mg total) by mouth 2 (two) times daily.   aspirin EC 81 MG tablet Take 1 tablet (81 mg total) by mouth daily. Swallow whole.   clobetasol 0.05 % external solution Commonly known as: TEMOVATE Apply 1 application  topically daily as needed (scalp irritation.).   estradiol 0.1 MG/GM vaginal cream Commonly known as: ESTRACE 1  gram vaginally twice weekly What changed:  how much to take how to take this when to take this reasons to take this additional instructions   fluticasone 50 MCG/ACT nasal spray Commonly known as: FLONASE Place 2 sprays into both nostrils at bedtime.   furosemide 20 MG tablet Commonly known as: LASIX Take 1 tablet (20 mg total) by mouth daily. What changed:  when to take this reasons to take this   furosemide 40 MG tablet Commonly known as: Lasix Take 1 tablet (40 mg total) by mouth daily. What changed: You were already taking a medication with the same name, and this prescription was added. Make sure you understand how and when to take each.   hydrocortisone cream 1 % Apply 1 Application topically as needed for itching.   ketoconazole 2 % shampoo Commonly known as: NIZORAL Apply 1 Application topically daily as needed (scalp irritation.).   liothyronine 5 MCG  tablet Commonly known as: CYTOMEL Take 5 mcg by mouth daily.   metoprolol tartrate 25 MG tablet Commonly known as: LOPRESSOR Take 0.5 tablets (12.5 mg total) by mouth 2 (two) times daily.   metroNIDAZOLE 0.75 % cream Commonly known as: METROCREAM Apply 1 application  topically in the morning. Face   potassium chloride SA 20 MEQ tablet Commonly known as: KLOR-CON M Take 1 tablet (20 mEq total) by mouth daily.   Synthroid 75 MCG tablet Generic drug: levothyroxine Take 75 mcg by mouth daily before breakfast.   terconazole 0.8 % vaginal cream Commonly known as: TERAZOL 3 Place 1 applicator vaginally at bedtime. Use for 3 days. What changed:  when to take this reasons to take this   traMADol 50 MG tablet Commonly known as: ULTRAM Take 1 tablet (50 mg total) by mouth every 6 (six) hours as needed for moderate pain.               Durable Medical Equipment  (From admission, onward)           Start     Ordered   02/14/22 1427  For home use only DME Walker rolling  Once       Question Answer Comment  Walker: With [place] Wheels   Patient needs a walker to treat with the following condition Physical deconditioning      02/14/22 1426            Follow-up Information     [facility] [place] Follow up on 03/03/2022.   Specialty: Cardiothoracic Surgery Why: Appointment is at 2:30PM Contact information: [place], [place] Follow up on 03/03/2022.   Why: To get PA/LAT CXR at 1:30PM Contact information: [place]        Minus Breeding, MD Follow up on 03/10/2022.   Specialty: Cardiology Why: Cardiology follow up at 10:00AM Contact information: [place] [address] [phone]         Baldwin Jamaica, PA-C Follow up on 02/28/2022.   Specialty:  Cardiology Why: Pacemaker check at 2:20PM Contact information: [address]  [place] [address] [place]. Follow up.   Why: [facility] office protocol referral- they will contact you to f/u and schedule for [facility] visits Contact information: [place] [address] [phone]  Signed:  Ellwood Handler, PA-C  02/21/2022, 7:59 AM

## 2022-02-13 NOTE — Evaluation (Signed)
Physical Therapy Evaluation Patient Details Name: Elaine Le MRN: [id number] DOB: Aug 03, 1960 Today's Date: 02/13/2022  History of Present Illness  61 yo admitted 10/17 for redo MVR same date with sternotomy. Extubated 10/18. 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 Pt with decreased strength, transfers, gait and mobility who will benefit from acute therapy to maximize mobility, safety and function to decrease burden of care.   HR 80-93 SpO2 95% on RA BP pre gait 105/55 Post gait 120/63        Recommendations for follow up therapy are one component of a multi-disciplinary discharge planning process, led by the attending physician.  Recommendations may be updated based on patient status, additional functional criteria and insurance authorization.  Follow Up Recommendations Home health PT      Assistance Recommended at Discharge Intermittent Supervision/Assistance  Patient can return home with the following  A little help with walking and/or transfers;Assistance with cooking/housework;Assist for transportation;A little help with bathing/dressing/bathroom    Equipment Recommendations Rolling walker (2 wheels)  Recommendations for Other Services  OT consult    Functional Status Assessment Patient has had a recent decline in their functional status and demonstrates the ability to make significant improvements in function in a reasonable and predictable amount of time.     Precautions / Restrictions Precautions Precautions: Sternal Precaution Booklet Issued: Yes (comment) Precaution Comments: post surgery book provided, chest tube, external pacer, art line Restrictions Weight Bearing Restrictions: Yes RUE Weight Bearing: Non weight bearing LUE Weight Bearing: Non weight bearing      Mobility  Bed  Mobility Overal bed mobility: Needs Assistance Bed Mobility: Sit to Supine       Sit to supine: Min assist   General bed mobility comments: HOB flat, min assist to lift legs to surface    Transfers Overall transfer level: Needs assistance   Transfers: Sit to/from Stand Sit to Stand: Min guard           General transfer comment: cues for hand placement and safety    Ambulation/Gait Ambulation/Gait assistance: Min guard Gait Distance (Feet): 220 Feet Assistive device: Rolling walker (2 wheels) Gait Pattern/deviations: Step-through pattern, Decreased stride length   Gait velocity interpretation: <1.8 ft/sec, indicate of risk for recurrent falls   General Gait Details: cues for direction with pt self-directing distance. HR 80-93 during gait  Stairs            Wheelchair Mobility    Modified Rankin (Stroke Patients Only)       Balance Overall balance assessment: Mild deficits observed, not formally tested                                           Pertinent Vitals/Pain Pain Assessment Pain Assessment: 0-10 Pain Score: 3  Pain Location: neck and incision Pain Descriptors / Indicators: Aching, Sore Pain Intervention(s): Limited activity within patient's tolerance, Repositioned, Monitored during session    [place] expects to be discharged to:: Private residence Living Arrangements: Alone Available Help at Discharge: Family;Available 24 hours/day Type of Home: House Home Access: Stairs to enter Entrance Stairs-Rails: None Entrance Stairs-Number of Steps: 2   Home Layout: One level Home Equipment: Cane - single point;Grab bars - toilet  Prior Function Prior Level of Function : Independent/Modified Independent                     Hand Dominance        Extremity/Trunk Assessment   Upper Extremity Assessment Upper Extremity Assessment: Generalized weakness (bil UE edema)    Lower Extremity  Assessment Lower Extremity Assessment: Overall WFL for tasks assessed    Cervical / Trunk Assessment Cervical / Trunk Assessment: Normal  Communication   Communication: No difficulties  Cognition Arousal/Alertness: Awake/alert Behavior During Therapy: WFL for tasks assessed/performed Overall Cognitive Status: Within Functional Limits for tasks assessed                                          General Comments      Exercises     Assessment/Plan    PT Assessment Patient needs continued PT services  PT Problem List Decreased strength;Decreased mobility;Decreased activity tolerance;Decreased balance;Decreased knowledge of use of DME;Pain       PT Treatment Interventions DME instruction;Therapeutic activities;Gait training;Functional mobility training;Stair training;Therapeutic exercise;Patient/family education    PT Goals (Current goals can be found in the Care Plan section)  Acute Rehab PT Goals Patient Stated Goal: return home, get outside PT Goal Formulation: With patient Time For Goal Achievement: 02/27/22 Potential to Achieve Goals: Good    Frequency Min 3X/week     Co-evaluation               AM-PAC PT "6 Clicks" Mobility  Outcome Measure Help needed turning from your back to your side while in a flat bed without using bedrails?: A Little Help needed moving from lying on your back to sitting on the side of a flat bed without using bedrails?: A Little Help needed moving to and from a bed to a chair (including a wheelchair)?: A Little Help needed standing up from a chair using your arms (e.g., wheelchair or bedside chair)?: A Little Help needed to walk in hospital room?: A Little Help needed climbing 3-5 steps with a railing? : A Lot 6 Click Score: 17    End of Session Equipment Utilized During Treatment: Gait belt Activity Tolerance: Patient tolerated treatment well Patient left: in bed;with call bell/phone within reach;with  nursing/sitter in room;with family/visitor present Nurse Communication: Mobility status PT Visit Diagnosis: Other abnormalities of gait and mobility (R26.89)    Time: 1010-1044 PT Time Calculation (min) (ACUTE ONLY): 34 min   Charges:   PT Evaluation $PT Eval Moderate Complexity: 1 Mod PT Treatments $Therapeutic Activity: 8-22 mins        Bayard Males, PT Acute Rehabilitation Services Office: [place] 02/13/2022, 11:00 AM

## 2022-02-13 NOTE — Consult Note (Signed)
ELECTROPHYSIOLOGY CONSULT NOTE    Patient ID: Elaine Le MRN: [id number], DOB/AGE: 1961/04/17 61 y.o.  Admit date: 02/11/2022 Date of Consult: 02/13/2022  Primary Physician: Biagio Borg, MD Primary Cardiologist: Minus Breeding, MD  Electrophysiologist: New   Referring Provider: Dr. Lavonna Monarch  Patient Profile: Elaine Le is a 61 y.o. female with a history of rheumatic MV disease s/p mini MVR in 2017, COPD, and GERD who is being seen today for the evaluation of CHB post MVR at the request of Dr. Lavonna Monarch.  HPI:  Elaine Le is a 61 y.o. female with medical history as above.   Pt was admitted 10/16 for planned re-do MVR in the setting of severe prosthetic valve MR and MS.   Potassium4.7 (10/19 0343) Magnesium  2.2 (10/19 0343) Creatinine, ser  0.62 (10/19 0343) PLT  111* (10/19 0343) HGB  8.1* (10/19 0343) WBC 13.5* (10/19 0343)    Surgical course was complicated by AV block with valve deployment requiring pacing. Required fluid and inotropic support that was weaned as tolerated. Extubated 02/12/2022.  Pt currently in NAD at rest seated in chair. Walked earlier. Awaiting chest tube removal. Having some swelling in extremities and face. Verbalizes understanding that she may need permanent pacemaker pending course.  Past Medical History:  Diagnosis Date   Acute respiratory failure with hypercapnia ([place]) 26/94/8546   Acute systolic heart failure ([place]) 09/2014   Adenomatous colon polyp    Arthritis    Back pain    Chronic diastolic heart failure (HCC)    related to MR/MS   Chronic pain syndrome    Chronic sinusitis    COPD (chronic obstructive pulmonary disease)-PFT pending  07/17/2014   Diverticulosis    Dyspnea    Fibromyalgia    GERD (gastroesophageal reflux disease)    Headaches, cluster    Heart murmur    Hiatal hernia    Hx of adenomatous colonic polyps 03/10/2011   Oct 2012, repeat colon Oct 2017    Hypothyroidism    Internal  hemorrhoids    Irritable bowel syndrome (IBS)    Mitral valve regurgitation    severe   Mitral valve stenosis, moderate 07/11/2014   Pneumonia    Feb. 24, 2016   Protein-calorie malnutrition, severe ([place]) 07/06/2014   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/10/2014   Severe mitral regurgitation with moderate mitral stenosis   S/P minimally invasive mitral valve replacement with bioprosthetic valve 11/08/2014   25 mm [facility] Mitral bovine bioprosthetic tissue valve placed via right mini thoracotomy approach   Sleep apnea    uses CPAP-setting 7   Tobacco abuse      Surgical History:  Past Surgical History:  Procedure Laterality Date   CYSTOSTOMY W/ BLADDER DILATION     at age 74   LEFT AND RIGHT HEART CATHETERIZATION WITH CORONARY ANGIOGRAM N/A 07/27/2014   Procedure: LEFT AND RIGHT HEART CATHETERIZATION WITH CORONARY ANGIOGRAM;  Surgeon: Burnell Blanks, MD;  Location: [facility] CATH LAB;  Right left heart catheterization: Mild/minimal coronary artery disease. RV: 50/7/17, PA: 48/29 (mean 38), PCWP 33 mmHg   MITRAL VALVE REPAIR Right 11/08/2014   Procedure: MINIMALLY INVASIVE MITRAL VALVE REPLACEMENT(MVR);  Surgeon: Rexene Alberts, MD;  Location: [place];  Service: Open Heart Surgery;  Laterality: Right;   MITRAL VALVE REPLACEMENT N/A 02/11/2022   Procedure: REDO MITRAL VALVE REPLACEMENT (MVR) VIA STERNOTOMY USING 27 MM MOSAIC PORCINE MITRAL VALVE;  Surgeon: [name], MD;  Location: [place];  Service: Open Heart  Surgery;  Laterality: N/A;   NEUROPLASTY / TRANSPOSITION MEDIAN NERVE AT CARPAL TUNNEL BILATERAL     RIGHT/LEFT HEART CATH AND CORONARY ANGIOGRAPHY N/A 12/26/2021   Procedure: RIGHT/LEFT HEART CATH AND CORONARY ANGIOGRAPHY;  Surgeon: Sherren Mocha, MD;  Location: [place] CV LAB;  Service: Cardiovascular;  Laterality: N/A;   TEE WITHOUT CARDIOVERSION N/A 07/10/2014   Procedure: TRANSESOPHAGEAL ECHOCARDIOGRAM (TEE);  Surgeon: Lelon Perla, MD;  Location: [facility]  ENDOSCOPY;  Service: Cardiovascular;  55-60%. No regional wall motion modalities. Moderate mitral stenosis with severe regurgitation and moderate to severely dilated left atrium.   TEE WITHOUT CARDIOVERSION N/A 11/08/2014   Procedure: TRANSESOPHAGEAL ECHOCARDIOGRAM (TEE);  Surgeon: Rexene Alberts, MD;  Location: [place];  Service: Open Heart Surgery;  Laterality: N/A;   TEE WITHOUT CARDIOVERSION N/A 06/07/2021   Procedure: TRANSESOPHAGEAL ECHOCARDIOGRAM (TEE);  Surgeon: Sueanne Margarita, MD;  Location: [facility] ENDOSCOPY;  Service: Cardiovascular;  Laterality: N/A;   TEE WITHOUT CARDIOVERSION N/A 02/11/2022   Procedure: TRANSESOPHAGEAL ECHOCARDIOGRAM (TEE);  Surgeon: [name], MD;  Location: [place];  Service: Open Heart Surgery;  Laterality: N/A;   THORACIC OUTLET SURGERY     TRANSTHORACIC ECHOCARDIOGRAM  07/04/2014   Normal LV Size/Fnx - EF 60-65% no RWMA; MV: thickened leaflets with doming, fixed Posterior leaflet, anterior leaflet w/ large/shaggy & mobile density.  c/w Rheumatic Mitral disease - moderate MS & Mod-Severe MR.   TUBAL LIGATION     VULVA SURGERY       Medications Prior to Admission  Medication Sig Dispense Refill Last Dose   albuterol (VENTOLIN HFA) 108 (90 Base) MCG/ACT inhaler Inhale 2 puffs into the lungs every 6 (six) hours as needed. 18 g 5 Past Month   clobetasol (TEMOVATE) 0.05 % external solution Apply 1 application  topically daily as needed (scalp irritation.).   Past Week   estradiol (ESTRACE) 0.1 MG/GM vaginal cream 1 gram vaginally twice weekly (Patient taking differently: Place 1 Applicatorful vaginally daily as needed (vaginal swelling).) 42.5 g 6 Past Week   fluticasone (FLONASE) 50 MCG/ACT nasal spray Place 2 sprays into both nostrils at bedtime.   Past Month   furosemide (LASIX) 20 MG tablet Take 1 tablet (20 mg total) by mouth daily. (Patient taking differently: Take 20 mg by mouth daily as needed for edema.) 90 tablet 3 02/10/2022   hydrocortisone cream 1 % Apply  1 Application topically as needed for itching.   Past Week   ketoconazole (NIZORAL) 2 % shampoo Apply 1 Application topically daily as needed (scalp irritation.).   02/10/2022   liothyronine (CYTOMEL) 5 MCG tablet Take 5 mcg by mouth daily.   2 02/11/2022 at 0430   metroNIDAZOLE (METROCREAM) 0.75 % cream Apply 1 application  topically in the morning. Face   02/10/2022   SYNTHROID 75 MCG tablet Take 75 mcg by mouth daily before breakfast.    02/11/2022 at 0430   terconazole (TERAZOL 3) 0.8 % vaginal cream Place 1 applicator vaginally at bedtime. Use for 3 days. (Patient taking differently: Place 1 applicator vaginally at bedtime as needed (Yeast). Use for 3 days.) 20 g 1 Past Week    Inpatient Medications:   acetaminophen  1,000 mg Oral Q6H   Or   acetaminophen (TYLENOL) oral liquid 160 mg/5 mL  1,000 mg Per Tube Q6H   aspirin EC  325 mg Oral Daily   Or   aspirin  324 mg Per Tube Daily   bisacodyl  10 mg Oral Daily   Or   bisacodyl  10 mg Rectal Daily   Chlorhexidine Gluconate Cloth  6 each Topical Daily   docusate sodium  200 mg Oral Daily   fluticasone  2 spray Each Nare QHS   insulin aspart  0-15 Units Subcutaneous TID WC   insulin aspart  0-5 Units Subcutaneous QHS   levothyroxine  75 mcg Oral QAC breakfast   liothyronine  5 mcg Oral Daily   pantoprazole  40 mg Oral Daily   sodium chloride flush  3 mL Intravenous Q12H    Allergies:  Allergies  Allergen Reactions   Estrace [Estradiol] Hives    Name Brand can be used    Reglan [Metoclopramide] Hives   Warfarin And Related Hives and Rash   Other Hives and Other (See Comments)    Any antidepressants per pt- causes altered mental state, anger Other reaction(s): rash   Morphine And Related Other (See Comments)    Altered mental state-Patient reports she "made satan look sweet"     Singulair [Montelukast Sodium] Other (See Comments)    Intolerance - effects her mood    Statins Other (See Comments)    Myalgias with several  statins    Social History   Socioeconomic History   Marital status: Single    Spouse name: Not on file   Number of children: 1   Years of education: Not on file   Highest education level: Not on file  Occupational History   Occupation: disabled  Tobacco Use   Smoking status: Former    Packs/day: 0.50    Years: 35.00    Total pack years: 17.50    Types: Cigarettes    Quit date: 06/17/2014    Years since quitting: 7.6   Smokeless tobacco: Never  Vaping Use   Vaping Use: Never used  Substance and Sexual Activity   Alcohol use: No   Drug use: No   Sexual activity: Never  Other Topics Concern   Not on file  Social History Narrative   Not on file   Social Determinants of Health   Financial Resource Strain: Not on file  Food Insecurity: Not on file  Transportation Needs: Not on file  Physical Activity: Not on file  Stress: Not on file  Social Connections: Not on file  Intimate Partner Violence: Not on file     Family History  Problem Relation Age of Onset   Kidney cancer Mother    Colon polyps Mother    Irritable bowel syndrome Mother    Diabetes Sister    Liver disease Sister    Irritable bowel syndrome Daughter    Breast cancer Paternal Aunt    Diabetes Brother    Colon cancer Neg Hx    Thyroid disease Neg Hx      Review of Systems: All other systems reviewed and are otherwise negative except as noted above.  Physical Exam: Vitals:   02/13/22 0915 02/13/22 0930 02/13/22 0945 02/13/22 1000  BP:    (!) 105/55  Pulse: 84 83 83 84  Resp: 17 20 (!) 21 20  Temp:      TempSrc:      SpO2: 97% 96% 97% 98%  Weight:      Height:        GEN- The patient is well appearing, alert and oriented x 3 today.   HEENT: normocephalic, atraumatic; sclera clear, conjunctiva pink; hearing intact; oropharynx clear; neck supple Lungs- Clear to ausculation bilaterally, normal work of breathing.  No wheezes, rales, rhonchi Heart- Regular rate and  rhythm, no murmurs, rubs or  gallops GI- soft, non-tender, non-distended, bowel sounds present Extremities- no clubbing, cyanosis, or edema; DP/PT/radial pulses 2+ bilaterally MS- no significant deformity or atrophy Skin- warm and dry, no rash or lesion Psych- euthymic mood, full affect Neuro- strength and sensation are intact  Labs:   Lab Results  Component Value Date   WBC 13.5 (H) 02/13/2022   HGB 8.1 (L) 02/13/2022   HCT 23.8 (L) 02/13/2022   MCV 86.5 02/13/2022   PLT 111 (L) 02/13/2022    Recent Labs  Lab 02/13/22 0343  NA 136  K 4.7  CL 105  CO2 24  BUN 5*  CREATININE 0.62  CALCIUM 7.7*  PROT 4.3*  BILITOT 0.5  ALKPHOS 47  ALT 14  AST 48*  GLUCOSE 128*      Radiology/Studies: DG Chest Port 1 View  Result Date: 02/12/2022 CLINICAL DATA:  Postop mitral valve repair. EXAM: PORTABLE CHEST 1 VIEW COMPARISON:  Radiographs 02/11/2022 and 02/07/2022.  CT 01/24/2022. FINDINGS: 0508 hours. Swan-Ganz catheter has been removed in the interval. Right IJ sheath remains in the upper SVC. The endotracheal tube, enteric tube and chest tubes are unchanged in position. The heart size and mediastinal contours are stable. Edema has resolved, and there is improving aeration of both lung bases. No pneumothorax or significant pleural effusion. Telemetry leads overlie the chest. IMPRESSION: Improved aeration of the lung bases and resolved edema. No pneumothorax. Electronically Signed   By: Richardean Sale M.D.   On: 02/12/2022 08:13   DG Chest Port 1 View  Result Date: 02/11/2022 CLINICAL DATA:  Mitral valve replacement EXAM: PORTABLE CHEST 1 VIEW COMPARISON:  02/07/2022 FINDINGS: Interval sternotomy and mitral valve replacement. Endotracheal tube terminates approximately 2.2 cm above the carina. Enteric tube courses below the diaphragm with side port near the GE junction. Left chest tube and mediastinal drain in place. Right IJ approach pulmonary arterial catheter terminates in the proximal pulmonary outflow. Cardiac  silhouette mildly enlarged. Increased opacity in the right lung base. Probable small right pleural effusion. No pneumothorax. IMPRESSION: 1. Interval postoperative changes to the chest from mitral valve replacement. No pneumothorax. 2. Enteric tube side port at the level of the GE junction. Consider advancement approximately 5 cm. 3. Remaining support lines and tubes appear in satisfactory positioning. 4. Increased opacity in the right lung base, likely atelectasis. Probable small right pleural effusion. Electronically Signed   By: Davina Poke D.O.   On: 02/11/2022 15:33   DG Chest 2 View  Result Date: 02/09/2022 CLINICAL DATA:  Pre-op chest radiograph for severe mitral regurgitation. EXAM: CHEST - 2 VIEW COMPARISON:  CT 01/24/2022.  Radiographs 10/31/2021 and 04/02/2019. FINDINGS: The heart size and mediastinal contours are stable status post mitral valve replacement. Interval mildly increased fissural and septal thickening, suspicious for progressive interstitial edema superimposed on underlying emphysema. There is no confluent airspace opacity or significant pleural effusion. Surgical clips are present in the left axilla. The bones appear unremarkable. IMPRESSION: Mildly increased interstitial edema superimposed on underlying emphysema. No confluent airspace opacity or significant pleural effusion. Electronically Signed   By: Richardean Sale M.D.   On: 02/09/2022 12:57   CT Chest Wo Contrast  Result Date: 01/27/2022 CLINICAL DATA:  COPD, cough, shortness of breath, history of mitral valve replacement EXAM: CT CHEST WITHOUT CONTRAST TECHNIQUE: Multidetector CT imaging of the chest was performed following the standard protocol without IV contrast. RADIATION DOSE REDUCTION: This exam was performed according to the departmental dose-optimization program which includes automated  exposure control, adjustment of the mA and/or kV according to patient size and/or use of iterative reconstruction technique.  COMPARISON:  CT chest angiogram, 06/21/2014 FINDINGS: Cardiovascular: Aortic atherosclerosis. Mild cardiomegaly. Mitral valve prosthesis. Left coronary artery calcifications. No pericardial effusion. Mediastinum/Nodes: Numerous enlarged mediastinal lymph nodes, unchanged, largest pretracheal nodes measuring up to 2.3 x 1.8 cm (series 2, image 54). Thyroid gland, trachea, and esophagus demonstrate no significant findings. Lungs/Pleura: Severe centrilobular emphysema. Diffuse bilateral bronchial wall thickening and interlobular septal thickening. Trace bilateral pleural effusions. Small, fat containing right-sided Bochdalek's hernia. Upper Abdomen: No acute abnormality. Musculoskeletal: No chest wall abnormality. No acute osseous findings. IMPRESSION: 1. Severe emphysema. 2. Diffuse bilateral bronchial wall thickening, interlobular septal thickening, and trace bilateral pleural effusions, consistent with pulmonary edema. 3. Cardiomegaly and coronary artery disease. Mitral valve prosthesis. 4. Numerous enlarged mediastinal lymph nodes, unchanged, benign and reactive. Aortic Atherosclerosis (ICD10-I70.0) and Emphysema (ICD10-J43.9). Electronically Signed   By: Delanna Ahmadi M.D.   On: 01/27/2022 21:24    EKG: Baseline EKG shows NSR at 91 bpm with narrow QRS and no conduction disease. (personally reviewed)  TELEMETRY: AV dual pacing overnight, but now V pacing in 80s, sinus with CHB and no escape underlying. V paced down to 40 vpm (personally reviewed)  Assessment/Plan: 1.  CHB s/p redo MVR (had mini MVR in 2017) Temp wire stable with threshold 2V today.  Pacer dependent down to 40 bpm.  With lack of conduction disease at baseline and mitral valve would expect some return of conduction, but per Dr. Lavonna Monarch was a technically complicated case and his expectation that conduction will return is very low.   2. S/p MVR, redo Originally had mini MVR in 2017 CTs and art line remain in place.  As pt continues to  convalesce will follow along for pending of pacer.  Generally would give as many as 5-7 days with her lack of conduction disease at baseline, but as above   EP will follow along for disposition. Will further discuss with MDs and potentially make NPO after midnight.   Would not start Eliquis until post pacer.  For questions or updates, please contact [place] Please consult [email or web address] for contact info under Cardiology/STEMI.  Jacalyn Lefevre, PA-C  02/13/2022 10:11 AM

## 2022-02-13 NOTE — Progress Notes (Signed)
NAME:  Elaine Le, MRN:  [id number], DOB:  09-20-1960, LOS: 2 ADMISSION DATE:  02/11/2022, CONSULTATION DATE:  02/11/2022 REFERRING MD:  Yolanda Manges, CHIEF COMPLAINT:  post-operative care.   History of Present Illness:  61 year old woman who presented for elective redo-MVR.   She has rheumatic heart disease and had a bioprosthetic MVR in 2017.  In the last year however (since having COVID) she has developed NYHA II-III dyspnea and lower extremity edema. Denied chest pain.  Work-up revealed severe MR with moderate PAH (sPAP 52)   She has seen Dr Shearon Stalls for COPD. Last seen in 09/2021.  PFT's showed moderate airflow obstruction.  She has OSA uses CPAP  Pertinent  Medical History   Past Medical History:  Diagnosis Date   Acute respiratory failure with hypercapnia ([place]) 86/76/1950   Acute systolic heart failure ([place]) 09/2014   Adenomatous colon polyp    Arthritis    Back pain    Chronic diastolic heart failure (HCC)    related to MR/MS   Chronic pain syndrome    Chronic sinusitis    COPD (chronic obstructive pulmonary disease)-PFT pending  07/17/2014   Diverticulosis    Dyspnea    Fibromyalgia    GERD (gastroesophageal reflux disease)    Headaches, cluster    Heart murmur    Hiatal hernia    Hx of adenomatous colonic polyps 03/10/2011   Oct 2012, repeat colon Oct 2017    Hypothyroidism    Internal hemorrhoids    Irritable bowel syndrome (IBS)    Mitral valve regurgitation    severe   Mitral valve stenosis, moderate 07/11/2014   Pneumonia    Feb. 24, 2016   Protein-calorie malnutrition, severe ([place]) 07/06/2014   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/10/2014   Severe mitral regurgitation with moderate mitral stenosis   S/P minimally invasive mitral valve replacement with bioprosthetic valve 11/08/2014   25 mm [facility] Mitral bovine bioprosthetic tissue valve placed via right mini thoracotomy approach   Sleep apnea    uses CPAP-setting 7   Tobacco abuse     Significant Hospital Events: Including procedures, antibiotic start and stop dates in addition to other pertinent events   10/17  Redo Mitral Valve replacement initially  using a 25 mm Mitris Pericardial valve which needed removal and placement of a 27 mm Mosaic Porcine valve 10/18 extubated; off pressors  Interim History / Subjective:  Extubated yesterday Off pressors Up in chair  Objective   Blood pressure (!) 99/53, pulse 87, temperature 98.2 F (36.8 C), temperature source Oral, resp. rate (!) 23, height 5' (1.524 m), weight 59.4 kg, SpO2 97 %. CVP:  [0 mmHg-19 mmHg] 14 mmHg  Vent Mode: PSV;CPAP FiO2 (%):  [40 %-50 %] 50 % PEEP:  [5 cmH20] 5 cmH20 Pressure Support:  [5 cmH20] 5 cmH20   Intake/Output Summary (Last 24 hours) at 02/13/2022 0703 Last data filed at 02/13/2022 0400 Gross per 24 hour  Intake 1662.07 ml  Output 2815 ml  Net -1152.93 ml    Filed Weights   02/11/22 0559 02/12/22 0400  Weight: 50.8 kg 59.4 kg   Examination: General:   NAD up in chair HEENT: MM pink/moist Neuro: Ao; MAE CV: s1s2, AV paced rate 80s PULM:  dim clear BS bilaterally GI: soft, bsx4 active  Extremities: warm/dry, no edema  Skin: no rashes or lesions appreciated   Ancillary tests personally reviewed:  PFT's personally reviewed shows mixed restrictive and obstructive pattern with no bronchodilator change.  No gas trapping CT chest shows diaphragmatic flattening with emphysematous changes at the apices.  HB 7.8  Assessment & Plan:   Critically ill due to cardiogenic and distributive shock (expected) following redo MVR. P: -TCTS following -off pressors -CT management per protocol -cont ASA -cont AV pacing per EP -lasix prn -prn pain meds  Critically ill due to postoperative mechanical ventilation  P: -extubated yesterday -pulm toiletry: IS -OOB and PT when appropriate  GOLD stage II (moderate) COPD - well controlled on prn albuterol only  P: -prn duoneb for  wheezing -denies current tobacco use   OSA  P: -cpap qhs and prn  Leukocytosis P: -likely post surgical related -trend wbc/fever curve  Anemia Thrombocytopenia P: -trend CBC  Hypothyroidism P: - Continue home liothyroxine and levothyroxine.  Best Practice (right click and "Reselect all SmartList Selections" daily)   Diet/type: clear liquids DVT prophylaxis: SCD GI prophylaxis: N/A Lines: Central line and Arterial Line can likely pull today  Foley:  Yes, and it is still needed Code Status:  full code Last date of multidisciplinary goals of care discussion [updated patient at bedside]  CRITICAL CARE Performed by: Jerilee Hoh [facility] 02/13/2022, 7:03 AM  Please see [email or web address] for pager details.  From 7A-7P if no response, please call [phone]. After hours, please call ELink [phone].

## 2022-02-13 NOTE — Progress Notes (Signed)
[address]       [place] [address]             [phone]      2 Days Post-Op  Procedure(s) (LRB): REDO MITRAL VALVE REPLACEMENT (MVR) VIA STERNOTOMY USING 27 MM MOSAIC PORCINE MITRAL VALVE (N/A) TRANSESOPHAGEAL ECHOCARDIOGRAM (TEE) (N/A)   Total Length of Stay:  LOS: 2 days    SUBJECTIVE: Comfortable No SOB OOB to chair Vitals:   02/13/22 0600 02/13/22 0700  BP: (!) 105/53 (!) 117/99  Pulse: 87   Resp: (!) 23 (!) 39  Temp:    SpO2: 97%     Intake/Output      10/18 0701 10/19 0700 10/19 0701 10/20 0700   P.O. 210    I.V. (mL/kg) 1025.2 (17.3)    Blood     NG/GT 10    IV Piggyback 416.8    Total Intake(mL/kg) 1662.1 (28)    Urine (mL/kg/hr) 2355 (1.7)    Emesis/NG output 0    Blood     Chest Tube 460    Total Output 2815    Net -1152.9             sodium chloride     sodium chloride     sodium chloride 20 mL/hr at 02/13/22 0400    ceFAZolin (ANCEF) IV Stopped (02/12/22 2224)   epinephrine Stopped (02/12/22 1950)   lactated ringers     lactated ringers     lactated ringers 20 mL/hr at 02/13/22 0400   milrinone Stopped (02/12/22 2035)   nitroGLYCERIN     phenylephrine (NEO-SYNEPHRINE) Adult infusion Stopped (02/12/22 2310)    CBC    Component Value Date/Time   WBC 13.5 (H) 02/13/2022 0343   RBC 2.75 (L) 02/13/2022 0343   HGB 8.1 (L) 02/13/2022 0343   HGB 12.5 12/23/2021 1117   HCT 23.8 (L) 02/13/2022 0343   HCT 37.4 12/23/2021 1117   PLT 111 (L) 02/13/2022 0343   PLT 255 12/23/2021 1117   MCV 86.5 02/13/2022 0343   MCV 86 12/23/2021 1117   MCH 29.5 02/13/2022 0343   MCHC 34.0 02/13/2022 0343   RDW 15.5 02/13/2022 0343   RDW 12.6 12/23/2021 1117   LYMPHSABS 1.5 06/26/2020 1124   MONOABS 0.4 06/26/2020 1124   EOSABS 0.1 06/26/2020 1124   BASOSABS 0.1 06/26/2020 1124   CMP     Component Value Date/Time   NA 136 02/13/2022 0343   NA 141 12/23/2021 1117   K 4.7 02/13/2022 0343   CL 105 02/13/2022 0343    CO2 24 02/13/2022 0343   GLUCOSE 128 (H) 02/13/2022 0343   BUN 5 (L) 02/13/2022 0343   BUN 7 (L) 12/23/2021 1117   CREATININE 0.62 02/13/2022 0343   CALCIUM 7.7 (L) 02/13/2022 0343   PROT 4.3 (L) 02/13/2022 0343   ALBUMIN 3.0 (L) 02/13/2022 0343   AST 48 (H) 02/13/2022 0343   ALT 14 02/13/2022 0343   ALKPHOS 47 02/13/2022 0343   BILITOT 0.5 02/13/2022 0343   GFRNONAA >60 02/13/2022 0343   GFRAA >60 04/02/2019 2059   ABG    Component Value Date/Time   PHART 7.396 02/12/2022 0501   PCO2ART 36.9 02/12/2022 0501   PO2ART 140 (H) 02/12/2022 0501   HCO3 22.8 02/12/2022 0501   TCO2 24 02/12/2022 0501   ACIDBASEDEF 2.0 02/12/2022 0501   O2SAT 66 02/13/2022 0343   CBG (last 3)  Recent Labs    02/12/22 1643 02/12/22 2116  02/13/22 0632  GLUCAP 162* 142* 128*   EXAM Lungs: clear Card: RR with no murmur Ext: warm and dry   ASSESSMENT: POD # 2 SP Redo MVR Off pressors, will dc art line and CVP in anticipation for need for PPM Heart block: Continues. EP following Will give lasix this am Will start eliquis tomorrow if ok with EP planning PPM Leave CT this am. Reasses later today   [name], MD '@DATE'$ @

## 2022-02-13 NOTE — Progress Notes (Signed)
EVENING ROUNDS NOTE :     [address]       [place] [address]             [phone]                 2 Days Post-Op Procedure(s) (LRB): REDO MITRAL VALVE REPLACEMENT (MVR) VIA STERNOTOMY USING 27 MM MOSAIC PORCINE MITRAL VALVE (N/A) TRANSESOPHAGEAL ECHOCARDIOGRAM (TEE) (N/A)   Total Length of Stay:  LOS: 2 days  Events:   No events     BP (!) 115/55   Pulse 87   Temp 99.4 F (37.4 C) (Oral)   Resp (!) 24   Ht 5' (1.524 m)   Wt 59.4 kg   SpO2 99%   BMI 25.58 kg/m   CVP:  [14 mmHg-19 mmHg] 16 mmHg      sodium chloride     sodium chloride     sodium chloride Stopped (02/13/22 0526)   epinephrine Stopped (02/12/22 1950)   lactated ringers     lactated ringers     lactated ringers Stopped (02/13/22 1006)   milrinone Stopped (02/12/22 2035)   nitroGLYCERIN     phenylephrine (NEO-SYNEPHRINE) Adult infusion Stopped (02/12/22 2310)    I/O last 3 completed shifts: In: 1901.8 [P.O.:210; I.V.:1164.9; NG/GT:10; IV Piggyback:516.9] Out: 3709 [Urine:2999; Chest Tube:710]      Latest Ref Rng & Units 02/13/2022    3:43 AM 02/12/2022    6:04 PM 02/12/2022    5:07 AM  CBC  WBC 4.0 - 10.5 K/uL 13.5  17.9  10.7   Hemoglobin 12.0 - 15.0 g/dL 8.1  8.0  8.8   Hematocrit 36.0 - 46.0 % 23.8  24.0  25.6   Platelets 150 - 400 K/uL 111  140  136        Latest Ref Rng & Units 02/13/2022    3:43 AM 02/12/2022    6:04 PM 02/12/2022    5:07 AM  BMP  Glucose 70 - 99 mg/dL 128  235  117   BUN 8 - 23 mg/dL '5  5  6   '$ Creatinine 0.44 - 1.00 mg/dL 0.62  0.72  0.64   Sodium 135 - 145 mmol/L 136  136  141   Potassium 3.5 - 5.1 mmol/L 4.7  3.5  3.8   Chloride 98 - 111 mmol/L 105  108  111   CO2 22 - 32 mmol/L '24  22  23   '$ Calcium 8.9 - 10.3 mg/dL 7.7  7.6  7.7     ABG    Component Value Date/Time   PHART 7.396 02/12/2022 0501   PCO2ART 36.9 02/12/2022 0501   PO2ART 140 (H) 02/12/2022 0501   HCO3 22.8 02/12/2022 0501   TCO2 24 02/12/2022 0501    ACIDBASEDEF 2.0 02/12/2022 0501   O2SAT 66 02/13/2022 0343       Melodie Bouillon, MD 02/13/2022 7:58 PM

## 2022-02-14 DIAGNOSIS — J9600 Acute respiratory failure, unspecified whether with hypoxia or hypercapnia: Secondary | ICD-10-CM | POA: Diagnosis not present

## 2022-02-14 LAB — COMPREHENSIVE METABOLIC PANEL
ALT: 14 U/L (ref 0–44)
AST: 30 U/L (ref 15–41)
Albumin: 3.1 g/dL — ABNORMAL LOW (ref 3.5–5.0)
Alkaline Phosphatase: 57 U/L (ref 38–126)
Anion gap: 8 (ref 5–15)
BUN: 9 mg/dL (ref 8–23)
CO2: 28 mmol/L (ref 22–32)
Calcium: 8.2 mg/dL — ABNORMAL LOW (ref 8.9–10.3)
Chloride: 96 mmol/L — ABNORMAL LOW (ref 98–111)
Creatinine, Ser: 0.72 mg/dL (ref 0.44–1.00)
GFR, Estimated: >60 mL/min (ref >60)
Glucose, Bld: 123 mg/dL — ABNORMAL HIGH (ref 70–99)
Potassium: 4.6 mmol/L (ref 3.5–5.1)
Sodium: 132 mmol/L — ABNORMAL LOW (ref 135–145)
Total Bilirubin: 0.7 mg/dL (ref 0.3–1.2)
Total Protein: 5 g/dL — ABNORMAL LOW (ref 6.5–8.1)

## 2022-02-14 LAB — HEPARIN LEVEL (UNFRACTIONATED): Heparin Unfractionated: <0.1 IU/mL — ABNORMAL LOW (ref 0.30–0.70)

## 2022-02-14 LAB — GLUCOSE, CAPILLARY
Glucose-Capillary: 122 mg/dL — ABNORMAL HIGH (ref 70–99)
Glucose-Capillary: 108 mg/dL — ABNORMAL HIGH (ref 70–99)
Glucose-Capillary: 97 mg/dL (ref 70–99)
Glucose-Capillary: 85 mg/dL (ref 70–99)

## 2022-02-14 LAB — CBC
HCT: 24.9 % — ABNORMAL LOW (ref 36.0–46.0)
Hemoglobin: 8.4 g/dL — ABNORMAL LOW (ref 12.0–15.0)
MCH: 29.3 pg (ref 26.0–34.0)
MCHC: 33.7 g/dL (ref 30.0–36.0)
MCV: 86.8 fL (ref 80.0–100.0)
Platelets: 150 10*3/uL (ref 150–400)
RBC: 2.87 MIL/uL — ABNORMAL LOW (ref 3.87–5.11)
RDW: 14.8 % (ref 11.5–15.5)
WBC: 16 10*3/uL — ABNORMAL HIGH (ref 4.0–10.5)
nRBC: 0 % (ref 0.0–0.2)

## 2022-02-14 LAB — MAGNESIUM: Magnesium: 2.1 mg/dL (ref 1.7–2.4)

## 2022-02-14 MED ORDER — ASPIRIN 81 MG PO CHEW: 81.0000 mg | CHEWABLE_TABLET | Freq: Every day | ORAL | Status: DC

## 2022-02-14 MED ORDER — GABAPENTIN 300 MG PO CAPS
300.0000 mg | ORAL_CAPSULE | Freq: Three times a day (TID) | ORAL | Status: AC
  Administered 2022-02-14 – 2022-02-16 (×9): 300 mg via ORAL
  Filled 2022-02-14 (×9): qty 1

## 2022-02-14 MED ORDER — FUROSEMIDE 10 MG/ML IJ SOLN
40.0000 mg | Freq: Once | INTRAMUSCULAR | Status: AC
  Administered 2022-02-14: 40 mg via INTRAVENOUS
  Filled 2022-02-14: qty 4

## 2022-02-14 MED ORDER — HEPARIN (PORCINE) 25000 UT/250ML-% IV SOLN
400.0000 [IU]/h | INTRAVENOUS | Status: DC
  Administered 2022-02-14 – 2022-02-17 (×2): 400 [IU]/h via INTRAVENOUS
  Filled 2022-02-14 (×2): qty 250

## 2022-02-14 MED ORDER — SODIUM CHLORIDE 0.9 % IV SOLN
6.2500 mg | Freq: Four times a day (QID) | INTRAVENOUS | Status: DC | PRN
  Administered 2022-02-14 – 2022-02-15 (×3): 6.25 mg via INTRAVENOUS
  Filled 2022-02-14 (×4): qty 0.25

## 2022-02-14 MED ORDER — ASPIRIN 81 MG PO TBEC
81.0000 mg | DELAYED_RELEASE_TABLET | Freq: Every day | ORAL | Status: DC
  Administered 2022-02-15 – 2022-02-21 (×7): 81 mg via ORAL
  Filled 2022-02-14 (×7): qty 1

## 2022-02-14 NOTE — Progress Notes (Signed)
Rounding Note    Patient Name: Elaine Le Date of Encounter: 02/14/2022  [place] Cardiologist: Minus Breeding, MD   Subjective   Feels a bit better every day, still swollen but less, + post-op discomfort, no CP, no rest SOB  Inpatient Medications    Scheduled Meds:  acetaminophen  1,000 mg Oral Q6H   Or   acetaminophen (TYLENOL) oral liquid 160 mg/5 mL  1,000 mg Per Tube Q6H   aspirin EC  325 mg Oral Daily   Or   aspirin  324 mg Per Tube Daily   bisacodyl  10 mg Oral Daily   Or   bisacodyl  10 mg Rectal Daily   Chlorhexidine Gluconate Cloth  6 each Topical Daily   docusate sodium  200 mg Oral Daily   fluticasone  2 spray Each Nare QHS   gabapentin  300 mg Oral TID   insulin aspart  0-15 Units Subcutaneous TID WC   insulin aspart  0-5 Units Subcutaneous QHS   levothyroxine  75 mcg Oral QAC breakfast   liothyronine  5 mcg Oral Daily   pantoprazole  40 mg Oral Daily   sodium chloride flush  3 mL Intravenous Q12H   Continuous Infusions:  sodium chloride     sodium chloride     sodium chloride Stopped (02/13/22 0526)   lactated ringers     lactated ringers     lactated ringers Stopped (02/13/22 1006)   phenylephrine (NEO-SYNEPHRINE) Adult infusion Stopped (02/12/22 2310)   PRN Meds: sodium chloride, alum & mag hydroxide-simeth, fentaNYL (SUBLIMAZE) injection, ipratropium-albuterol **AND** [COMPLETED] ipratropium-albuterol, lactated ringers, ondansetron (ZOFRAN) IV, mouth rinse, sodium chloride flush, traMADol   Vital Signs    Vitals:   02/14/22 0400 02/14/22 0500 02/14/22 0600 02/14/22 0800  BP: 122/64 119/63 137/73 (!) 113/49  Pulse: 80 81 95 81  Resp: 15 13 (!) 23 17  Temp:      TempSrc:      SpO2: 100% 100% 98% 99%  Weight:  60.1 kg    Height:        Intake/Output Summary (Last 24 hours) at 02/14/2022 0831 Last data filed at 02/14/2022 0800 Gross per 24 hour  Intake 107.73 ml  Output 1124 ml  Net -1016.27 ml       02/14/2022    5:00 AM 02/12/2022    4:00 AM 02/11/2022    5:59 AM  Last 3 Weights  Weight (lbs) 132 lb 7.9 oz 130 lb 15.3 oz 112 lb  Weight (kg) 60.1 kg 59.4 kg 50.803 kg      Telemetry    Paced, no underlying conduction this AM - Personally Reviewed  ECG    No new EKGs - Personally Reviewed  Physical Exam   GEN: No acute distress.   Neck: No JVD Cardiac: RRR, no murmurs, rubs, or gallops.  Respiratory: diminished at the bases slightly GI: Soft, nontender, non-distended  MS: generalized edema, trace b/l LE,; No deformity. Neuro:  Nonfocal  Psych: Normal affect   Labs    High Sensitivity Troponin:  No results for input(s): "TROPONINIHS" in the last 720 hours.   Chemistry Recent Labs  Lab 02/12/22 1416 02/12/22 1804 02/13/22 0343 02/14/22 0616  NA  --  136 136 132*  K  --  3.5 4.7 4.6  CL  --  108 105 96*  CO2  --  '22 24 28  '$ GLUCOSE  --  235* 128* 123*  BUN  --  5* 5* 9  CREATININE  --  0.72 0.62 0.72  CALCIUM  --  7.6* 7.7* 8.2*  MG  --  2.3 2.2 2.1  PROT 4.7*  --  4.3* 5.0*  ALBUMIN 3.6  --  3.0* 3.1*  AST 66*  --  48* 30  ALT 17  --  14 14  ALKPHOS 34*  --  47 57  BILITOT 0.8  --  0.5 0.7  GFRNONAA  --  >60 >60 >60  ANIONGAP  --  '6 7 8    '$ Lipids No results for input(s): "CHOL", "TRIG", "HDL", "LABVLDL", "LDLCALC", "CHOLHDL" in the last 168 hours.  Hematology Recent Labs  Lab 02/12/22 1804 02/13/22 0343 02/14/22 0616  WBC 17.9* 13.5* 16.0*  RBC 2.78* 2.75* 2.87*  HGB 8.0* 8.1* 8.4*  HCT 24.0* 23.8* 24.9*  MCV 86.3 86.5 86.8  MCH 28.8 29.5 29.3  MCHC 33.3 34.0 33.7  RDW 15.6* 15.5 14.8  PLT 140* 111* 150   Thyroid  Recent Labs  Lab 02/11/22 2119  TSH 0.835    BNPNo results for input(s): "BNP", "PROBNP" in the last 168 hours.  DDimer No results for input(s): "DDIMER" in the last 168 hours.   Radiology    No results found.  Cardiac Studies    12/26/21: LHC 1.  Patent coronary arteries with minimal irregularity, codominant coronary  distribution 2.  Large V waves of 41 mmHg consistent with severe mitral regurgitation 3.  Mean transmitral gradient of 27 mmHg, calculated mitral valve area of 0.8 cm.  Gradients are falsely elevated from large V waves related to severe MR. 4.  Moderate pulmonary hypertension with mean PA pressure 41 mmHg, transpulmonary gradient 8 mmHg, PVR less than 2 Wood units   Recommend: Surgical referral to evaluate treatment options for mitral valve replacement     12/16/21; TTE 1. 25 mm bioprosthesis. MG 14 @ 78 bpm. Emax 2.5 m/s, EOA 1.14 cm2, DVI  2.6, PHT 72 ms. All suggest severe regurgitation. Based on prior TEE, MR  is severe. All parameters point to severe MR to explain elevated gradient.  The mitral valve has been  repaired/replaced. Severe mitral valve regurgitation. There is a 25 mm  Edwards Magna bioprosthetic valve present in the mitral position.  Procedure Date: 11/08/2014.   2. Left ventricular ejection fraction, by estimation, is 65 to 70%. The  left ventricle has normal function. The left ventricle has no regional  wall motion abnormalities. Left ventricular diastolic function could not  be evaluated.   3. Right ventricular systolic function is normal. The right ventricular  size is normal. Tricuspid regurgitation signal is inadequate for assessing  PA pressure.   4. Left atrial size was mild to moderately dilated.   5. The aortic valve is tricuspid. Aortic valve regurgitation is not  visualized. No aortic stenosis is present.   6. The inferior vena cava is normal in size with greater than 50%  respiratory variability, suggesting right atrial pressure of 3 mmHg.   Comparison(s): No significant change from prior study. MR remains severe.   Patient Profile     61 y.o. 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 and underwent re-do MV Replacement 02/11/22 (bioprosthetic noting a coumadin allergy)  Interop  developed CHB  Assessment & Plan    Inter-post op CHB No baseline conduction system disease pre-op In review of record, however given extensive surgical procedure, CTS felt low likely hood of conduction recovery  Epicardial threshold  is 92m this AM No conduction  She still has a CT in this AM Would like to see her clsoer to discharge, CT out and line-free period This will give her conduction another couple days to recover If not will plan for Monday  I am unable track down his inter-op TEE report  She has been seen by Dr. MMyles Gipthis AM Ok to resume diet  For questions or updates, please contact CDeerfieldPlease consult [email or web address] for contact info under        Signed, RBaldwin Jamaica PA-C  02/14/2022, 8:31 AM

## 2022-02-14 NOTE — Evaluation (Signed)
Occupational Therapy Evaluation Patient Details Name: Elaine Le MRN: [id number] DOB: 1961/02/24 Today's Date: 02/14/2022   History of Present Illness 61 yo admitted 10/17 for redo MVR same date with sternotomy. Extubated 10/18. PPM planned 10/23. 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 Currently she is setup/S-min A for basic ADLs with possible pacemaker placement on Monday of next week. Pt will continue to benefit from acute OT with follow up [place].      Recommendations for follow up therapy are one component of a multi-disciplinary discharge planning process, led by the attending physician.  Recommendations may be updated based on patient status, additional functional criteria and insurance authorization.   Follow Up Recommendations  Home health OT    Assistance Recommended at Discharge Frequent or constant Supervision/Assistance  Patient can return home with the following A little help with walking and/or transfers;A little help with bathing/dressing/bathroom;Help with stairs or ramp for entrance;Assistance with cooking/housework;Assist for transportation    Functional Status Assessment  Patient has had a recent decline in their functional status and demonstrates the ability to make significant improvements in function in a reasonable and predictable amount of time.  Equipment Recommendations  Tub/shower seat (pt is Medicaid secondary)       Precautions / Restrictions Precautions Precautions: Sternal Precaution Comments: external pacer Restrictions Weight Bearing Restrictions: Yes RUE Weight Bearing: Non weight bearing LUE Weight Bearing: Non weight bearing      Mobility Bed Mobility               General bed mobility comments: Pt up in recliner upon  arrival            ADL either performed or assessed with clinical judgement   ADL Overall ADL's : Needs assistance/impaired Eating/Feeding: Independent;Sitting Eating/Feeding Details (indicate cue type and reason): recliner Grooming: Set up;Sitting Grooming Details (indicate cue type and reason): recliner Upper Body Bathing: Minimal assistance;Sitting Upper Body Bathing Details (indicate cue type and reason): recliner Lower Body Bathing: Minimal assistance;Sit to/from stand   Upper Body Dressing : Minimal assistance;Sitting Upper Body Dressing Details (indicate cue type and reason): recliner Lower Body Dressing: Minimal assistance;Sit to/from stand                 General ADL Comments: We discussed upper body dressing post possible pacemaker next week, she is able to cross her legs to get to her feet for LBD, we discussed a tub seat for her to use at home. Wrote recommendations down on back of heart care book.     Vision Patient Visual Report: No change from baseline              Pertinent Vitals/Pain Pain Assessment Pain Assessment: No/denies pain     Hand Dominance Right   Extremity/Trunk Assessment Upper Extremity Assessment Upper Extremity Assessment:  (Bil UE mildly edematous)           Communication Communication Communication: No difficulties   Cognition Arousal/Alertness: Awake/alert Behavior During Therapy: Flat affect Overall Cognitive Status: Within Functional Limits for tasks assessed  Home Living Family/patient expects to be discharged to:: Private residence Living Arrangements: Alone Available Help at Discharge: Family;Available 24 hours/day Type of Home: House Home Access: Stairs to enter [employer] of Steps: 2 Entrance Stairs-Rails: None Home Layout: One level     Bathroom Shower/Tub: Corporate investment banker: Standard     Home  Equipment: Cane - single point;Grab bars - toilet;Hand held shower head   Additional Comments: likes to garden (vegetable) and has several pets      Prior Functioning/Environment Prior Level of Function : Independent/Modified Independent                        OT Problem List: Decreased activity tolerance;Impaired balance (sitting and/or standing);Decreased strength      OT Treatment/Interventions: Self-care/ADL training;DME and/or AE instruction;Patient/family education;Balance training    OT Goals(Current goals can be found in the care plan section) Acute Rehab OT Goals Patient Stated Goal: to get back to my animals and gardening OT Goal Formulation: With patient Time For Goal Achievement: 02/28/22 Potential to Achieve Goals: Good  OT Frequency: Min 2X/week       AM-PAC OT "6 Clicks" Daily Activity     Outcome Measure Help from another person eating meals?: None Help from another person taking care of personal grooming?: A Little Help from another person toileting, which includes using toliet, bedpan, or urinal?: A Little Help from another person bathing (including washing, rinsing, drying)?: A Little Help from another person to put on and taking off regular upper body clothing?: A Little Help from another person to put on and taking off regular lower body clothing?: A Little 6 Click Score: 19   End of Session Nurse Communication:  (pt with a little emesis)  Activity Tolerance:  (limited by nausea and emesis) Patient left: in chair;with call bell/phone within reach;with family/visitor present  OT Visit Diagnosis: Other abnormalities of gait and mobility (R26.89);Muscle weakness (generalized) (M62.81)                Time: 8280-0349 OT Time Calculation (min): 22 min Charges:  OT General Charges $OT Visit: 1 Visit OT Evaluation $OT Eval Moderate Complexity: 1 Mod  .Golden Circle, OTR/L Acute Rehab Services [facility] [phone] Office  [phone]    Almon Register 02/14/2022, 12:11 PM

## 2022-02-14 NOTE — Progress Notes (Signed)
Physical Therapy Treatment Patient Details Name: Elaine Le MRN: [id number] DOB: 1960/11/11 Today's Date: 02/14/2022   History of Present Illness 61 yo admitted 10/17 for redo MVR same date with sternotomy. Extubated 10/18. PPM planned 10/23. 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 Pt educated for sternal precautions and pending PPM precautions. Pt able to progress gait distance but became nauseated last 50' with seated rest and emesis of liquid with RN present. Pt requires 1L with all activity and at rest due to desaturation with education for IS and pt only pulling 550cc. Pt educated for precautions, progression, HEP and IS. Will continue to follow.   HR 60-89 SpO2 97% on 1L at rest, desaturation to 87% on RA at rest, 1L to maintain 92% with activity Pre gait 108/65 Post gait 118/63    Recommendations for follow up therapy are one component of a multi-disciplinary discharge planning process, led by the attending physician.  Recommendations may be updated based on patient status, additional functional criteria and insurance authorization.  Follow Up Recommendations  Home health PT     Assistance Recommended at Discharge Intermittent Supervision/Assistance  Patient can return home with the following A little help with walking and/or transfers;Assistance with cooking/housework;Assist for transportation;A little help with bathing/dressing/bathroom   Equipment Recommendations  Rolling walker (2 wheels)    Recommendations for Other Services       Precautions / Restrictions Precautions Precautions: Sternal Precaution Comments: external pacer Restrictions Weight Bearing Restrictions: Yes RUE Weight Bearing: Non weight bearing LUE Weight Bearing: Non weight bearing     Mobility  Bed Mobility Overal bed mobility: Needs Assistance Bed  Mobility: Rolling, Sidelying to Sit Rolling: Min guard Sidelying to sit: Min guard       General bed mobility comments: cues for sequence with bed flat    Transfers Overall transfer level: Needs assistance   Transfers: Sit to/from Stand Sit to Stand: Min guard           General transfer comment: cues for hand placement and safety    Ambulation/Gait Ambulation/Gait assistance: Min guard Gait Distance (Feet): 300 Feet Assistive device: Rolling walker (2 wheels) Gait Pattern/deviations: Step-through pattern, Decreased stride length   Gait velocity interpretation: <1.8 ft/sec, indicate of risk for recurrent falls   General Gait Details: cues for direction with pt self-directing distance limited by nausea   Stairs             Wheelchair Mobility    Modified Rankin (Stroke Patients Only)       Balance Overall balance assessment: Mild deficits observed, not formally tested                                          Cognition Arousal/Alertness: Awake/alert Behavior During Therapy: Flat affect Overall Cognitive Status: Within Functional Limits for tasks assessed                                          Exercises General Exercises - Lower Extremity Long Arc Quad: AROM, Both, 15 reps, Seated    General Comments        Pertinent Vitals/Pain Pain Assessment Pain Score: 4  Pain Location: neck, incision, edema bil UE Pain Descriptors / Indicators: Aching, Sore Pain Intervention(s): Limited activity within patient's tolerance, Monitored during session, Repositioned    Home Living                          Prior Function            PT Goals (current goals can now be found in the care plan section) Progress towards PT goals: Progressing toward goals    Frequency    Min 3X/week      PT Plan Current plan remains appropriate    Co-evaluation              AM-PAC PT "6 Clicks" Mobility   Outcome  Measure  Help needed turning from your back to your side while in a flat bed without using bedrails?: A Little Help needed moving from lying on your back to sitting on the side of a flat bed without using bedrails?: A Little Help needed moving to and from a bed to a chair (including a wheelchair)?: A Little Help needed standing up from a chair using your arms (e.g., wheelchair or bedside chair)?: A Little Help needed to walk in hospital room?: A Little Help needed climbing 3-5 steps with a railing? : A Lot 6 Click Score: 17    End of Session Equipment Utilized During Treatment: Gait belt Activity Tolerance: Patient tolerated treatment well Patient left: with call bell/phone within reach;with nursing/sitter in room;with family/visitor present;in chair Nurse Communication: Mobility status PT Visit Diagnosis: Other abnormalities of gait and mobility (R26.89);Difficulty in walking, not elsewhere classified (R26.2)     Time: 3568-6168 PT Time Calculation (min) (ACUTE ONLY): 27 min  Charges:  $Gait Training: 8-22 mins $Therapeutic Activity: 8-22 mins                     Bayard Males, PT Acute Rehabilitation Services Office: [place] 02/14/2022, 11:52 AM

## 2022-02-14 NOTE — Progress Notes (Signed)
ANTICOAGULATION CONSULT NOTE - Initial Consult  Pharmacy Consult for heparin Indication:  mitral valve replacement  Allergies  Allergen Reactions   Estrace [Estradiol] Hives    Name Brand can be used    Reglan [Metoclopramide] Hives   Warfarin And Related Hives and Rash   Other Hives and Other (See Comments)    Any antidepressants per pt- causes altered mental state, anger Other reaction(s): rash   Morphine And Related Other (See Comments)    Altered mental state-Patient reports she "made satan look sweet"     Singulair [Montelukast Sodium] Other (See Comments)    Intolerance - effects her mood    Statins Other (See Comments)    Myalgias with several statins    Patient Measurements: Height: 5' (152.4 cm) Weight: 60.1 kg (132 lb 7.9 oz) IBW/kg (Calculated) : 45.5 Heparin Dosing Weight: 50.8 kg   Vital Signs: Temp: 97.6 F (36.4 C) (10/20 2000) Temp Source: Axillary (10/20 2000) BP: 118/60 (10/20 2115) Pulse Rate: 77 (10/20 2100)  Labs: Recent Labs    02/12/22 1804 02/13/22 0343 02/14/22 0616 02/14/22 2051  HGB 8.0* 8.1* 8.4*  --   HCT 24.0* 23.8* 24.9*  --   PLT 140* 111* 150  --   HEPARINUNFRC  --   --   --  <0.10*  CREATININE 0.72 0.62 0.72  --     Estimated Creatinine Clearance: 59.8 mL/min (by C-G formula based on SCr of 0.72 mg/dL).   Medical History: Past Medical History:  Diagnosis Date   Acute respiratory failure with hypercapnia ([facility]) 40/34/7425   Acute systolic heart failure ([facility]) 09/2014   Adenomatous colon polyp    Arthritis    Back pain    Chronic diastolic heart failure ([facility])    related to MR/MS   Chronic pain syndrome    Chronic sinusitis    COPD (chronic obstructive pulmonary disease)-PFT pending  07/17/2014   Diverticulosis    Dyspnea    Fibromyalgia    GERD (gastroesophageal reflux disease)    Headaches, cluster    Heart murmur    Hiatal hernia    Hx of adenomatous colonic polyps 03/10/2011   Oct 2012, repeat colon Oct 2017     Hypothyroidism    Internal hemorrhoids    Irritable bowel syndrome (IBS)    Mitral valve regurgitation    severe   Mitral valve stenosis, moderate 07/11/2014   Pneumonia    Feb. 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 Given unable to use warfarin, plan per conversation with Dr. Lavonna Monarch, will use apixaban once cleared by EP after PPM.  MD would like to cover with heparin until PPM - given recent surgery, will do fixed rate at 400 units/hr (~8 units/kg/hr) and not titrate unless elevated.   Heparin level <0.1 is therapeutic on 400 units/hr.  Goal of Therapy:  Heparin level <0.3 - do not titrate unless cleared with MD Monitor platelets by anticoagulation protocol: Yes   Plan:  Continue heparin at 400 units/hr Monitor daily heparin level, CBC Monitor for  signs/symptoms of bleeding    Benetta Spar, PharmD, BCPS, [facility] Clinical Pharmacist  Please check AMION for all [place] phone numbers After 10:00 PM, call [place] [phone]

## 2022-02-14 NOTE — Progress Notes (Signed)
NAME:  Elaine Le, MRN:  [id number], DOB:  [birth year], LOS: 3 ADMISSION DATE:  02/11/2022, CONSULTATION DATE:  02/11/2022 REFERRING MD:  Yolanda Manges, CHIEF COMPLAINT:  post-operative care.   History of Present Illness:  61 year old woman who presented for elective redo-MVR.   She has rheumatic heart disease and had a bioprosthetic MVR in 2017.  In the last year however (since having COVID) she has developed NYHA II-III dyspnea and lower extremity edema. Denied chest pain.  Work-up revealed severe MR with moderate PAH (sPAP 52)   She has seen Dr Shearon Stalls for COPD. Last seen in 09/2021.  PFT's showed moderate airflow obstruction.  She has OSA uses CPAP  Pertinent  Medical History   Past Medical History:  Diagnosis Date   Acute respiratory failure with hypercapnia ([place]) 82/42/3536   Acute systolic heart failure ([place]) 09/2014   Adenomatous colon polyp    Arthritis    Back pain    Chronic diastolic heart failure (HCC)    related to MR/MS   Chronic pain syndrome    Chronic sinusitis    COPD (chronic obstructive pulmonary disease)-PFT pending  07/17/2014   Diverticulosis    Dyspnea    Fibromyalgia    GERD (gastroesophageal reflux disease)    Headaches, cluster    Heart murmur    Hiatal hernia    Hx of adenomatous colonic polyps 03/10/2011   Oct 2012, repeat colon Oct 2017    Hypothyroidism    Internal hemorrhoids    Irritable bowel syndrome (IBS)    Mitral valve regurgitation    severe   Mitral valve stenosis, moderate 07/11/2014   Pneumonia    Feb. 24, 2016   Protein-calorie malnutrition, severe ([place]) 07/06/2014   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/10/2014   Severe mitral regurgitation with moderate mitral stenosis   S/P minimally invasive mitral valve replacement with bioprosthetic valve 11/08/2014   25 mm [facility] Mitral bovine bioprosthetic tissue valve placed via right mini thoracotomy approach   Sleep apnea    uses CPAP-setting 7   Tobacco abuse     Significant Hospital Events: Including procedures, antibiotic start and stop dates in addition to other pertinent events   10/17  Redo Mitral Valve replacement initially  using a 25 mm Mitris Pericardial valve which needed removal and placement of a 27 mm Mosaic Porcine valve 10/18 extubated; off pressors 10/20 awaiting PPM implantation   Interim History / Subjective:  Complaining of nausea this morning. Received quite a bit of narcotics overnight.   Objective   Blood pressure 121/69, pulse 85, temperature 97.7 F (36.5 C), temperature source Oral, resp. rate 20, height 5' (1.524 m), weight 60.1 kg, SpO2 96 %.        Intake/Output Summary (Last 24 hours) at 02/14/2022 0935 Last data filed at 02/14/2022 1443 Gross per 24 hour  Intake 25.06 ml  Output 1384 ml  Net -1358.94 ml    Filed Weights   02/11/22 0559 02/12/22 0400 02/14/22 0500  Weight: 50.8 kg 59.4 kg 60.1 kg   Examination: General:   NAD up in chair HEENT: MM pink/moist Neuro: Ao; MAE CV: s1s2, AV paced rate 80s PULM:  dim clear BS bilaterally GI: soft, bsx4 active  Extremities: warm/dry, no edema  Skin: no rashes or lesions appreciated   Ancillary tests personally reviewed:  PFT's personally reviewed shows mixed restrictive and obstructive pattern with no bronchodilator change.  No gas trapping CT chest shows diaphragmatic flattening with emphysematous changes at the  apices.  HB 7.8  Assessment & Plan:   Complete heart block post MVR.  - Awaiting PPM placement.  - No beta-blockade  Status post MVR via median sternotomy. - Still having significant pain requiring narcotics, causing nausea.  - Have added gabapentin for co-analgesia.   Was critically ill due to cardiogenic and distributive shock (expected) following redo MVR. -Now off pressors.  -Usual post-operative care.  -Diuresed again today  Was critically ill due to postoperative mechanical ventilation  -extubated yesterday -pulm toiletry:  IS -OOB and PT when appropriate  GOLD stage II (moderate) COPD - well controlled on prn albuterol only  -prn duoneb for wheezing -denies current tobacco use   OSA  -cpap qhs and prn  Leukocytosis - Likely reactive post operative leukocytosis.  -trend wbc/fever curve  Anemia Thrombocytopenia P: -trend CBC  Hypothyroidism - Continue home liothyroxine and levothyroxine.  Best Practice (right click and "Reselect all SmartList Selections" daily)   Diet/type: clear liquids Full diet.  DVT prophylaxis: SCD GI prophylaxis: N/A Lines: Central line and Arterial Line can likely pull today  Foley:  Yes, and it is still needed Code Status:  full code Last date of multidisciplinary goals of care discussion [updated patient at bedside]  Kipp Brood, MD [facility] ICU Physician [place]  Pager: [phone] Or [place] After hours: [phone].  02/14/2022, 9:47 AM

## 2022-02-14 NOTE — Progress Notes (Signed)
[address]       [place] [address]             [phone]      3 Days Post-Op  Procedure(s) (LRB): REDO MITRAL VALVE REPLACEMENT (MVR) VIA STERNOTOMY USING 27 MM MOSAIC PORCINE MITRAL VALVE (N/A) TRANSESOPHAGEAL ECHOCARDIOGRAM (TEE) (N/A)   Total Length of Stay:  LOS: 3 days    SUBJECTIVE: Vomited this am. Stomach feels better now No sign discomfort Vitals:   02/14/22 0500 02/14/22 0600  BP: 119/63 137/73  Pulse: 81 95  Resp: 13 (!) 23  Temp:    SpO2: 100% 98%    Intake/Output      10/19 0701 10/20 0700 10/20 0701 10/21 0700   P.O.     I.V. (mL/kg) 55 (0.9)    NG/GT     IV Piggyback 100.1    Total Intake(mL/kg) 155.1 (2.6)    Urine (mL/kg/hr) 889 (0.6)    Emesis/NG output 0    Chest Tube 270    Total Output 1159    Net -1003.9         Emesis Occurrence 2 x        sodium chloride     sodium chloride     sodium chloride Stopped (02/13/22 0526)   epinephrine Stopped (02/12/22 1950)   lactated ringers     lactated ringers     lactated ringers Stopped (02/13/22 1006)   milrinone Stopped (02/12/22 2035)   nitroGLYCERIN     phenylephrine (NEO-SYNEPHRINE) Adult infusion Stopped (02/12/22 2310)    CBC    Component Value Date/Time   WBC 16.0 (H) 02/14/2022 0616   RBC 2.87 (L) 02/14/2022 0616   HGB 8.4 (L) 02/14/2022 0616   HGB 12.5 12/23/2021 1117   HCT 24.9 (L) 02/14/2022 0616   HCT 37.4 12/23/2021 1117   PLT 150 02/14/2022 0616   PLT 255 12/23/2021 1117   MCV 86.8 02/14/2022 0616   MCV 86 12/23/2021 1117   MCH 29.3 02/14/2022 0616   MCHC 33.7 02/14/2022 0616   RDW 14.8 02/14/2022 0616   RDW 12.6 12/23/2021 1117   LYMPHSABS 1.5 06/26/2020 1124   MONOABS 0.4 06/26/2020 1124   EOSABS 0.1 06/26/2020 1124   BASOSABS 0.1 06/26/2020 1124   CMP     Component Value Date/Time   NA 132 (L) 02/14/2022 0616   NA 141 12/23/2021 1117   K 4.6 02/14/2022 0616   CL 96 (L) 02/14/2022 0616   CO2 28 02/14/2022 0616   GLUCOSE 123  (H) 02/14/2022 0616   BUN 9 02/14/2022 0616   BUN 7 (L) 12/23/2021 1117   CREATININE 0.72 02/14/2022 0616   CALCIUM 8.2 (L) 02/14/2022 0616   PROT 5.0 (L) 02/14/2022 0616   ALBUMIN 3.1 (L) 02/14/2022 0616   AST 30 02/14/2022 0616   ALT 14 02/14/2022 0616   ALKPHOS 57 02/14/2022 0616   BILITOT 0.7 02/14/2022 0616   GFRNONAA >60 02/14/2022 0616   GFRAA >60 04/02/2019 2059   ABG    Component Value Date/Time   PHART 7.396 02/12/2022 0501   PCO2ART 36.9 02/12/2022 0501   PO2ART 140 (H) 02/12/2022 0501   HCO3 22.8 02/12/2022 0501   TCO2 24 02/12/2022 0501   ACIDBASEDEF 2.0 02/12/2022 0501   O2SAT 66 02/13/2022 0343   CBG (last 3)  Recent Labs    02/13/22 1538 02/13/22 2148 02/14/22 0615  GLUCAP 104* 128* 122*  EXAM Lungs: clear Card: RR with soft systolic  murmur Ext: warm and dry Abd: soft nontender   ASSESSMENT: POD # 3 Redo MVR EP to assess timing of PPM Follow nausea for now Remove CT    [name], MD '@DATE'$ @

## 2022-02-14 NOTE — Progress Notes (Signed)
ANTICOAGULATION CONSULT NOTE - Initial Consult  Pharmacy Consult for heparin Indication:  mitral valve replacement  Allergies  Allergen Reactions   Estrace [Estradiol] Hives    Name Brand can be used    Reglan [Metoclopramide] Hives   Warfarin And Related Hives and Rash   Other Hives and Other (See Comments)    Any antidepressants per pt- causes altered mental state, anger Other reaction(s): rash   Morphine And Related Other (See Comments)    Altered mental state-Patient reports she "made satan look sweet"     Singulair [Montelukast Sodium] Other (See Comments)    Intolerance - effects her mood    Statins Other (See Comments)    Myalgias with several statins    Patient Measurements: Height: 5' (152.4 cm) Weight: 60.1 kg (132 lb 7.9 oz) IBW/kg (Calculated) : 45.5 Heparin Dosing Weight: 50.8 kg   Vital Signs: Temp: 98.2 F (36.8 C) (10/20 1100) Temp Source: Oral (10/20 1100) BP: 121/67 (10/20 1300) Pulse Rate: 80 (10/20 1300)  Labs: Recent Labs    02/11/22 1527 02/11/22 1655 02/12/22 1804 02/13/22 0343 02/14/22 0616  HGB 9.3*   < > 8.0* 8.1* 8.4*  HCT 28.1*   < > 24.0* 23.8* 24.9*  PLT 130*   < > 140* 111* 150  APTT 48*  --   --   --   --   LABPROT 22.8*  --   --   --   --   INR 2.0*  --   --   --   --   CREATININE  --    < > 0.72 0.62 0.72   < > = values in this interval not displayed.    Estimated Creatinine Clearance: 59.8 mL/min (by C-G formula based on SCr of 0.72 mg/dL).   Medical History: Past Medical History:  Diagnosis Date   Acute respiratory failure with hypercapnia ([place]) 16/01/9603   Acute systolic heart failure ([place]) 09/2014   Adenomatous colon polyp    Arthritis    Back pain    Chronic diastolic heart failure (HCC)    related to MR/MS   Chronic pain syndrome    Chronic sinusitis    COPD (chronic obstructive pulmonary disease)-PFT pending  07/17/2014   Diverticulosis    Dyspnea    Fibromyalgia    GERD (gastroesophageal reflux  disease)    Headaches, cluster    Heart murmur    Hiatal hernia    Hx of adenomatous colonic polyps 03/10/2011   Oct 2012, repeat colon Oct 2017    Hypothyroidism    Internal hemorrhoids    Irritable bowel syndrome (IBS)    Mitral valve regurgitation    severe   Mitral valve stenosis, moderate 07/11/2014   Pneumonia    Feb. 24, 2016   Protein-calorie malnutrition, severe ([place]) 07/06/2014   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/10/2014   Severe mitral regurgitation with moderate mitral stenosis   S/P minimally invasive mitral valve replacement with bioprosthetic valve 11/08/2014   25 mm [facility] Mitral bovine bioprosthetic tissue valve placed via right mini thoracotomy approach   Sleep apnea    uses CPAP-setting 7   Tobacco abuse     Medications:  Scheduled:   acetaminophen  1,000 mg Oral Q6H   Or   acetaminophen (TYLENOL) oral liquid 160 mg/5 mL  1,000 mg Per Tube Q6H   [START ON 02/15/2022] aspirin EC  81 mg Oral Daily   Or   [START ON 02/15/2022] aspirin  81 mg Per Tube  Daily   bisacodyl  10 mg Oral Daily   Or   bisacodyl  10 mg Rectal Daily   Chlorhexidine Gluconate Cloth  6 each Topical Daily   docusate sodium  200 mg Oral Daily   fluticasone  2 spray Each Nare QHS   gabapentin  300 mg Oral TID   insulin aspart  0-15 Units Subcutaneous TID WC   insulin aspart  0-5 Units Subcutaneous QHS   levothyroxine  75 mcg Oral QAC breakfast   liothyronine  5 mcg Oral Daily   pantoprazole  40 mg Oral Daily   sodium chloride flush  3 mL Intravenous Q12H    Assessment: 33 yof who underwent redo mitral valve on 10/17 - of note, allergy to warfarin (hives/rash). Given unable to use warfarin, plan per conversation with Dr Lavonna Monarch, will use apixaban once cleared by EP after PPM.  MD would like to cover with heparin until PPM - given recent surgery, will do fixed rate at 400 units/hr (~8 units/kg/hr) and not titrate unless elevated. Hgb 8.4, plt 150. No s/sx of bleeding.  Goal  of Therapy:  Heparin level <0.3 - do not titrate unless cleared with MD Monitor platelets by anticoagulation protocol: Yes   Plan:  Start heparin infusion at 400 units/hr Check anti-Xa level in 6 hours and daily while on heparin Continue to monitor H&H and platelets  Antonietta Jewel, PharmD, [place] Pharmacist  Phone: [phone] 02/14/2022 2:07 PM  Please check AMION for all [place] phone numbers After 10:00 PM, call [place] [phone]

## 2022-02-15 DIAGNOSIS — Z952 Presence of prosthetic heart valve: Secondary | ICD-10-CM | POA: Diagnosis not present

## 2022-02-15 DIAGNOSIS — R570 Cardiogenic shock: Secondary | ICD-10-CM

## 2022-02-15 DIAGNOSIS — I340 Nonrheumatic mitral (valve) insufficiency: Secondary | ICD-10-CM

## 2022-02-15 LAB — CBC
HCT: 25.4 % — ABNORMAL LOW (ref 36.0–46.0)
Hemoglobin: 8.4 g/dL — ABNORMAL LOW (ref 12.0–15.0)
MCH: 29.5 pg (ref 26.0–34.0)
MCHC: 33.1 g/dL (ref 30.0–36.0)
MCV: 89.1 fL (ref 80.0–100.0)
Platelets: 182 10*3/uL (ref 150–400)
RBC: 2.85 MIL/uL — ABNORMAL LOW (ref 3.87–5.11)
RDW: 14.6 % (ref 11.5–15.5)
WBC: 10.7 10*3/uL — ABNORMAL HIGH (ref 4.0–10.5)
nRBC: 0 % (ref 0.0–0.2)

## 2022-02-15 LAB — TYPE AND SCREEN
ABO/RH(D): A NEG
Antibody Screen: NEGATIVE
Unit division: 0
Unit division: 0
Unit division: 0
Unit division: 0

## 2022-02-15 LAB — BPAM RBC
Blood Product Expiration Date: 202310272359
Blood Product Expiration Date: 202310282359
Blood Product Expiration Date: 202310292359
Blood Product Expiration Date: 202310312359
ISSUE DATE / TIME: 202310170734
ISSUE DATE / TIME: 202310170734
ISSUE DATE / TIME: 202310171122
ISSUE DATE / TIME: 202310171122
Unit Type and Rh: 600
Unit Type and Rh: 600
Unit Type and Rh: 600
Unit Type and Rh: 600

## 2022-02-15 LAB — BASIC METABOLIC PANEL
Anion gap: 10 (ref 5–15)
BUN: 9 mg/dL (ref 8–23)
CO2: 29 mmol/L (ref 22–32)
Calcium: 8 mg/dL — ABNORMAL LOW (ref 8.9–10.3)
Chloride: 95 mmol/L — ABNORMAL LOW (ref 98–111)
Creatinine, Ser: 0.68 mg/dL (ref 0.44–1.00)
GFR, Estimated: >60 mL/min (ref >60)
Glucose, Bld: 89 mg/dL (ref 70–99)
Potassium: 4.1 mmol/L (ref 3.5–5.1)
Sodium: 134 mmol/L — ABNORMAL LOW (ref 135–145)

## 2022-02-15 LAB — GLUCOSE, CAPILLARY
Glucose-Capillary: 76 mg/dL (ref 70–99)
Glucose-Capillary: 77 mg/dL (ref 70–99)
Glucose-Capillary: 96 mg/dL (ref 70–99)
Glucose-Capillary: 105 mg/dL — ABNORMAL HIGH (ref 70–99)
Glucose-Capillary: 86 mg/dL (ref 70–99)

## 2022-02-15 LAB — HEPARIN LEVEL (UNFRACTIONATED): Heparin Unfractionated: <0.1 IU/mL — ABNORMAL LOW (ref 0.30–0.70)

## 2022-02-15 MED ORDER — ONDANSETRON 4 MG PO TBDP
4.0000 mg | ORAL_TABLET | Freq: Four times a day (QID) | ORAL | Status: DC | PRN
  Administered 2022-02-15: 4 mg via ORAL
  Filled 2022-02-15 (×2): qty 1

## 2022-02-15 NOTE — Progress Notes (Signed)
NAME:  Elaine Le, MRN:  [id number], DOB:  1960/05/07, LOS: 4 ADMISSION DATE:  02/11/2022, CONSULTATION DATE:  02/11/2022 REFERRING MD:  Yolanda Manges, CHIEF COMPLAINT:  post-operative care.   History of Present Illness:  61 year old woman who presented for elective redo-MVR.   She has rheumatic heart disease and had a bioprosthetic MVR in 2017.  In the last year however (since having COVID) she has developed NYHA II-III dyspnea and lower extremity edema. Denied chest pain.  Work-up revealed severe MR with moderate PAH (sPAP 52)   She has seen Dr Shearon Stalls for COPD. Last seen in 09/2021.  PFT's showed moderate airflow obstruction.  She has OSA uses CPAP  Pertinent  Medical History   Past Medical History:  Diagnosis Date   Acute respiratory failure with hypercapnia ([place]) 61/60/7371   Acute systolic heart failure ([place]) 09/2014   Adenomatous colon polyp    Arthritis    Back pain    Chronic diastolic heart failure (HCC)    related to MR/MS   Chronic pain syndrome    Chronic sinusitis    COPD (chronic obstructive pulmonary disease)-PFT pending  07/17/2014   Diverticulosis    Dyspnea    Fibromyalgia    GERD (gastroesophageal reflux disease)    Headaches, cluster    Heart murmur    Hiatal hernia    Hx of adenomatous colonic polyps 03/10/2011   Oct 2012, repeat colon Oct 2017    Hypothyroidism    Internal hemorrhoids    Irritable bowel syndrome (IBS)    Mitral valve regurgitation    severe   Mitral valve stenosis, moderate 07/11/2014   Pneumonia    Feb. 24, 2016   Protein-calorie malnutrition, severe ([place]) 07/06/2014   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/10/2014   Severe mitral regurgitation with moderate mitral stenosis   S/P minimally invasive mitral valve replacement with bioprosthetic valve 11/08/2014   25 mm [facility] Mitral bovine bioprosthetic tissue valve placed via right mini thoracotomy approach   Sleep apnea    uses CPAP-setting 7   Tobacco abuse     Significant Hospital Events: Including procedures, antibiotic start and stop dates in addition to other pertinent events   10/17  Redo Mitral Valve replacement initially  using a 25 mm Mitris Pericardial valve which needed removal and placement of a 27 mm Mosaic Porcine valve 10/18 extubated; off pressors 10/20 awaiting PPM implantation   Interim History / Subjective:  Complaining of nausea this morning, stated it is better than yesterday Denies chest pain  Objective   Blood pressure 113/64, pulse 96, temperature 98.7 F (37.1 C), temperature source Oral, resp. rate 13, height 5' (1.524 m), weight 60.5 kg, SpO2 100 %.        Intake/Output Summary (Last 24 hours) at 02/15/2022 0809 Last data filed at 02/15/2022 0700 Gross per 24 hour  Intake 564.61 ml  Output 1720 ml  Net -1155.39 ml   Filed Weights   02/12/22 0400 02/14/22 0500 02/15/22 0500  Weight: 59.4 kg 60.1 kg 60.5 kg   Examination: Physical exam: General: Middle-age female, lying on the bed HEENT: [place]/AT, eyes anicteric.  moist mucus membranes Neuro: Alert, awake following commands Chest: Coarse breath sounds, no wheezes or rhonchi Heart: Paced rhythm, pansystolic murmurs best heard in mitral area or gallops Abdomen: Soft, nontender, nondistended, bowel sounds present Skin: No rash   Assessment & Plan:  Complete heart block post MVR Currently paced rhythm Awaiting PPM placement Avoid AV nodal blocking agents  Status post redo MVR via median sternotomy. Chest pain is better Continue tramadol and gabapentin On IV heparin infusion, monitor PTT  Cardiogenic and distributive shock post redo MVR. Remain off pressors Further diuresis deferred to TCTS [place] is 66%  Acute respiratory insufficiency postprocedure, resolved Acute hypoxic respiratory failure due to bilateral atelectasis Patient continues to require 2 L nasal cannula oxygen, she desats when oxygen is turned off Encourage incentive spirometry Out  of bed to chair PT/OT evaluation  GOLD stage II (moderate) COPD Well controlled on prn albuterol only   OSA  CPAP at night  Postop acute blood loss anemia, expected Thrombocytopenia Hemoglobin remained stable, platelet count came up to 182  Hyponatremia likely due to volume overload Closely monitor intake and output Repeat electrolytes in the morning  Hypothyroidism Continue home liothyroxine and levothyroxine.  Best Practice (right click and "Reselect all SmartList Selections" daily)   Diet/type: Regular consistency DVT prophylaxis: Systemic heparin GI prophylaxis: Protonix Lines: Central line and Arterial Line can likely pull today  Foley:  Yes, and it is still needed Code Status:  full code Last date of multidisciplinary goals of care discussion [updated patient at bedside]     Jacky Kindle, MD [place] for pager If no response to pager, please call [phone] until 7pm After 7pm, Please call [place] [phone]

## 2022-02-15 NOTE — Progress Notes (Addendum)
POD 4 REDO MV  S Looks well in chair, in good spirits, parents present  O Vitals:   02/15/22 1930 02/15/22 2000  BP: (!) 119/50 117/61  Pulse: 60 83  Resp: 16 17  Temp:    SpO2: 100% 99%     Intake/Output Summary (Last 24 hours) at 02/15/2022 2214 Last data filed at 02/15/2022 2000 Gross per 24 hour  Intake 586.88 ml  Output 885 ml  Net -298.12 ml    I/O last 3 completed shifts: In: 1012.6 [P.O.:570; I.V.:109.2; IV Piggyback:333.3] Out: 2115 [Urine:2055; Chest Tube:60]       Latest Ref Rng & Units 02/15/2022    5:52 AM 02/14/2022    6:16 AM 02/13/2022    3:43 AM  CBC  WBC 4.0 - 10.5 K/uL 10.7  16.0  13.5   Hemoglobin 12.0 - 15.0 g/dL 8.4  8.4  8.1   Hematocrit 36.0 - 46.0 % 25.4  24.9  23.8   Platelets 150 - 400 K/uL 182  150  111     CMP     Component Value Date/Time   NA 134 (L) 02/15/2022 0552   NA 141 12/23/2021 1117   K 4.1 02/15/2022 0552   CL 95 (L) 02/15/2022 0552   CO2 29 02/15/2022 0552   GLUCOSE 89 02/15/2022 0552   BUN 9 02/15/2022 0552   BUN 7 (L) 12/23/2021 1117   CREATININE 0.68 02/15/2022 0552   CALCIUM 8.0 (L) 02/15/2022 0552   PROT 5.0 (L) 02/14/2022 0616   ALBUMIN 3.1 (L) 02/14/2022 0616   AST 30 02/14/2022 0616   ALT 14 02/14/2022 0616   ALKPHOS 57 02/14/2022 0616   BILITOT 0.7 02/14/2022 0616   GFRNONAA >60 02/15/2022 0552   GFRAA >60 04/02/2019 2059    Plan: Doing well PPM early this week Net neg 1L, ongoing diuresis On heparin gtt.

## 2022-02-15 NOTE — Plan of Care (Signed)
  Problem: Education: Goal: Will demonstrate proper wound care and an understanding of methods to prevent future damage Outcome: Progressing Goal: Knowledge of disease or condition will improve Outcome: Progressing Goal: Knowledge of the prescribed therapeutic regimen will improve Outcome: Progressing Goal: Individualized Educational Video(s) Outcome: Progressing   Problem: Clinical Measurements: Goal: Postoperative complications will be avoided or minimized Outcome: Progressing   Problem: Cardiac: Goal: Will achieve and/or maintain hemodynamic stability Outcome: Progressing   Problem: Activity: Goal: Risk for activity intolerance will decrease Outcome: Progressing

## 2022-02-15 NOTE — Progress Notes (Signed)
Pt declined CPAP for tonight. 

## 2022-02-15 NOTE — Progress Notes (Signed)

## 2022-02-15 NOTE — Progress Notes (Addendum)
[place] for heparin Indication:  mitral valve replacement  Allergies  Allergen Reactions   Estrace [Estradiol] Hives    Name Brand can be used    Reglan [Metoclopramide] Hives   Warfarin And Related Hives and Rash   Other Hives and Other (See Comments)    Any antidepressants per pt- causes altered mental state, anger Other reaction(s): rash   Morphine And Related Other (See Comments)    Altered mental state-Patient reports she "made satan look sweet"     Singulair [Montelukast Sodium] Other (See Comments)    Intolerance - effects her mood    Statins Other (See Comments)    Myalgias with several statins    Patient Measurements: Height: 5' (152.4 cm) Weight: 60.5 kg (133 lb 6.1 oz) IBW/kg (Calculated) : 45.5 Heparin Dosing Weight: 50.8 kg   Vital Signs: Temp: 98.7 F (37.1 C) (10/21 0726) Temp Source: Oral (10/21 0726) BP: 102/53 (10/21 0730) Pulse Rate: 91 (10/21 0730)  Labs: Recent Labs    02/13/22 0343 02/14/22 0616 02/14/22 2051 02/15/22 0552  HGB 8.1* 8.4*  --  8.4*  HCT 23.8* 24.9*  --  25.4*  PLT 111* 150  --  182  HEPARINUNFRC  --   --  <0.10* <0.10*  CREATININE 0.62 0.72  --  0.68     Estimated Creatinine Clearance: 60 mL/min (by C-G formula based on SCr of 0.68 mg/dL).   Medical History: Past Medical History:  Diagnosis Date   Acute respiratory failure with hypercapnia ([place]) 70/62/3762   Acute systolic heart failure (HCC) 09/2014   Adenomatous colon polyp    Arthritis    Back pain    Chronic diastolic heart failure (HCC)    related to MR/MS   Chronic pain syndrome    Chronic sinusitis    COPD (chronic obstructive pulmonary disease)-PFT pending  07/17/2014   Diverticulosis    Dyspnea    Fibromyalgia    GERD (gastroesophageal reflux disease)    Headaches, cluster    Heart murmur    Hiatal hernia    Hx of adenomatous colonic polyps 03/10/2011   Oct 2012, repeat colon Oct 2017    Hypothyroidism     Internal hemorrhoids    Irritable bowel syndrome (IBS)    Mitral valve regurgitation    severe   Mitral valve stenosis, moderate 07/11/2014   Pneumonia    Feb. 24, 2016   Protein-calorie malnutrition, severe ([place]) 07/06/2014   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/10/2014   Severe mitral regurgitation with moderate mitral stenosis   S/P minimally invasive mitral valve replacement with bioprosthetic valve 11/08/2014   25 mm [facility] Mitral bovine bioprosthetic tissue valve placed via right mini thoracotomy approach   Sleep apnea    uses CPAP-setting 7   Tobacco abuse     Medications:  Scheduled:   acetaminophen  1,000 mg Oral Q6H   Or   acetaminophen (TYLENOL) oral liquid 160 mg/5 mL  1,000 mg Per Tube Q6H   aspirin EC  81 mg Oral Daily   Or   aspirin  81 mg Per Tube Daily   bisacodyl  10 mg Oral Daily   Or   bisacodyl  10 mg Rectal Daily   Chlorhexidine Gluconate Cloth  6 each Topical Daily   docusate sodium  200 mg Oral Daily   fluticasone  2 spray Each Nare QHS   gabapentin  300 mg Oral TID   insulin aspart  0-15 Units Subcutaneous TID WC  insulin aspart  0-5 Units Subcutaneous QHS   levothyroxine  75 mcg Oral QAC breakfast   liothyronine  5 mcg Oral Daily   pantoprazole  40 mg Oral Daily   sodium chloride flush  3 mL Intravenous Q12H    Assessment: 89 yof who underwent redo mitral valve on 10/17 - of note, allergy to warfarin (hives/rash). Given unable to use warfarin, plan per conversation with Dr Lavonna Monarch, will use apixaban once cleared by EP after PPM.  MD would like to cover with heparin until PPM - given recent surgery, will do fixed rate at 400 units/hr (~8 units/kg/hr) and not titrate unless elevated.   Heparin level <0.1 as expected, CBC stable. Discussed with nursing, no S/Sx bleeding since heparin started.  Goal of Therapy:  Heparin level <0.3 - do not titrate unless cleared with MD Monitor platelets by anticoagulation protocol: Yes   Plan:   Heparin 400 units/h Daily heparin level and CBC  Arrie Senate, PharmD, [place], [facility] Clinical Pharmacist [phone] Please check AMION for all [facility] Pharmacy numbers 02/15/2022

## 2022-02-16 DIAGNOSIS — I340 Nonrheumatic mitral (valve) insufficiency: Secondary | ICD-10-CM | POA: Diagnosis not present

## 2022-02-16 DIAGNOSIS — Z952 Presence of prosthetic heart valve: Secondary | ICD-10-CM | POA: Diagnosis not present

## 2022-02-16 DIAGNOSIS — R570 Cardiogenic shock: Secondary | ICD-10-CM

## 2022-02-16 LAB — CBC
HCT: 24.5 % — ABNORMAL LOW (ref 36.0–46.0)
Hemoglobin: 7.8 g/dL — ABNORMAL LOW (ref 12.0–15.0)
MCH: 29 pg (ref 26.0–34.0)
MCHC: 31.8 g/dL (ref 30.0–36.0)
MCV: 91.1 fL (ref 80.0–100.0)
Platelets: 204 10*3/uL (ref 150–400)
RBC: 2.69 MIL/uL — ABNORMAL LOW (ref 3.87–5.11)
RDW: 14.6 % (ref 11.5–15.5)
WBC: 7.8 10*3/uL (ref 4.0–10.5)
nRBC: 0 % (ref 0.0–0.2)

## 2022-02-16 LAB — BASIC METABOLIC PANEL
Anion gap: 9 (ref 5–15)
BUN: 7 mg/dL — ABNORMAL LOW (ref 8–23)
CO2: 29 mmol/L (ref 22–32)
Calcium: 7.9 mg/dL — ABNORMAL LOW (ref 8.9–10.3)
Chloride: 98 mmol/L (ref 98–111)
Creatinine, Ser: 0.65 mg/dL (ref 0.44–1.00)
GFR, Estimated: >60 mL/min (ref >60)
Glucose, Bld: 103 mg/dL — ABNORMAL HIGH (ref 70–99)
Potassium: 4.6 mmol/L (ref 3.5–5.1)
Sodium: 136 mmol/L (ref 135–145)

## 2022-02-16 LAB — GLUCOSE, CAPILLARY
Glucose-Capillary: 89 mg/dL (ref 70–99)
Glucose-Capillary: 154 mg/dL — ABNORMAL HIGH (ref 70–99)
Glucose-Capillary: 108 mg/dL — ABNORMAL HIGH (ref 70–99)
Glucose-Capillary: 124 mg/dL — ABNORMAL HIGH (ref 70–99)

## 2022-02-16 LAB — MAGNESIUM: Magnesium: 2.1 mg/dL (ref 1.7–2.4)

## 2022-02-16 LAB — HEPARIN LEVEL (UNFRACTIONATED): Heparin Unfractionated: <0.1 IU/mL — ABNORMAL LOW (ref 0.30–0.70)

## 2022-02-16 MED ORDER — SODIUM CHLORIDE 0.9% FLUSH
10.0000 mL | Freq: Two times a day (BID) | INTRAVENOUS | Status: DC
  Administered 2022-02-16 – 2022-02-20 (×8): 10 mL

## 2022-02-16 MED ORDER — FUROSEMIDE 20 MG PO TABS
20.0000 mg | ORAL_TABLET | Freq: Every day | ORAL | Status: DC
  Administered 2022-02-16: 20 mg via ORAL
  Filled 2022-02-16: qty 1

## 2022-02-16 MED ORDER — SODIUM CHLORIDE 0.9% FLUSH: 10.0000 mL | INTRAVENOUS | Status: DC | PRN

## 2022-02-16 NOTE — Progress Notes (Signed)
Rounding Note    Patient Name: Elaine Le Date of Encounter: 02/16/2022  [place] Cardiologist: Minus Breeding, MD   Subjective   Patient feeling well today.  No complaints at this time.  Frustrated that conduction has not improved.  Inpatient Medications    Scheduled Meds:  acetaminophen  1,000 mg Oral Q6H   Or   acetaminophen (TYLENOL) oral liquid 160 mg/5 mL  1,000 mg Per Tube Q6H   aspirin EC  81 mg Oral Daily   Or   aspirin  81 mg Per Tube Daily   bisacodyl  10 mg Oral Daily   Or   bisacodyl  10 mg Rectal Daily   Chlorhexidine Gluconate Cloth  6 each Topical Daily   docusate sodium  200 mg Oral Daily   fluticasone  2 spray Each Nare QHS   gabapentin  300 mg Oral TID   insulin aspart  0-15 Units Subcutaneous TID WC   insulin aspart  0-5 Units Subcutaneous QHS   levothyroxine  75 mcg Oral QAC breakfast   liothyronine  5 mcg Oral Daily   pantoprazole  40 mg Oral Daily   sodium chloride flush  3 mL Intravenous Q12H   Continuous Infusions:  sodium chloride     sodium chloride     sodium chloride Stopped (02/13/22 0526)   heparin 400 Units/hr (02/16/22 0500)   lactated ringers     lactated ringers     lactated ringers Stopped (02/13/22 1006)   promethazine (PHENERGAN) injection (IM or IVPB) Stopped (02/15/22 2228)   PRN Meds: sodium chloride, alum & mag hydroxide-simeth, fentaNYL (SUBLIMAZE) injection, ipratropium-albuterol **AND** [COMPLETED] ipratropium-albuterol, lactated ringers, ondansetron (ZOFRAN) IV, ondansetron, mouth rinse, promethazine (PHENERGAN) injection (IM or IVPB), sodium chloride flush, traMADol   Vital Signs    Vitals:   02/16/22 0400 02/16/22 0500 02/16/22 0700 02/16/22 0720  BP: (!) 108/52 (!) 98/47 (!) 109/56   Pulse: 88 80 100 99  Resp: '15 14 17 '$ (!) 22  Temp:      TempSrc:      SpO2: 100% 100% 98% 98%  Weight:  60.4 kg    Height:        Intake/Output Summary (Last 24 hours) at 02/16/2022 0750 Last data  filed at 02/16/2022 0600 Gross per 24 hour  Intake 386.7 ml  Output 2035 ml  Net -1648.3 ml      02/16/2022    5:00 AM 02/15/2022    5:00 AM 02/14/2022    5:00 AM  Last 3 Weights  Weight (lbs) 133 lb 2.5 oz 133 lb 6.1 oz 132 lb 7.9 oz  Weight (kg) 60.4 kg 60.5 kg 60.1 kg      Telemetry    Sinus rhythm, ventricular paced- Personally Reviewed  ECG    None new- Personally Reviewed  Physical Exam   GEN: Well nourished, well developed, in no acute distress  HEENT: normal  Neck: no JVD, carotid bruits, or masses Cardiac: RRR; no murmurs, rubs, or gallops,no edema  Respiratory:  clear to auscultation bilaterally, normal work of breathing GI: soft, nontender, nondistended, + BS MS: no deformity or atrophy  Skin: warm and dry, well-healing median sternotomy scar  Neuro:  Strength and sensation are intact Psych: euthymic mood, full affect   Labs    High Sensitivity Troponin:  No results for input(s): "TROPONINIHS" in the last 720 hours.   Chemistry Recent Labs  Lab 02/12/22 1416 02/12/22 1804 02/13/22 0343 02/14/22 0616 02/15/22 0552 02/16/22 0429  NA  --    < >  136 132* 134* 136  K  --    < > 4.7 4.6 4.1 4.6  CL  --    < > 105 96* 95* 98  CO2  --    < > '24 28 29 29  '$ GLUCOSE  --    < > 128* 123* 89 103*  BUN  --    < > 5* 9 9 7*  CREATININE  --    < > 0.62 0.72 0.68 0.65  CALCIUM  --    < > 7.7* 8.2* 8.0* 7.9*  MG  --    < > 2.2 2.1  --  2.1  PROT 4.7*  --  4.3* 5.0*  --   --   ALBUMIN 3.6  --  3.0* 3.1*  --   --   AST 66*  --  48* 30  --   --   ALT 17  --  14 14  --   --   ALKPHOS 34*  --  47 57  --   --   BILITOT 0.8  --  0.5 0.7  --   --   GFRNONAA  --    < > >60 >60 >60 >60  ANIONGAP  --    < > '7 8 10 9   '$ < > = values in this interval not displayed.    Lipids No results for input(s): "CHOL", "TRIG", "HDL", "LABVLDL", "LDLCALC", "CHOLHDL" in the last 168 hours.  Hematology Recent Labs  Lab 02/14/22 0616 02/15/22 0552 02/16/22 0429  WBC 16.0* 10.7*  7.8  RBC 2.87* 2.85* 2.69*  HGB 8.4* 8.4* 7.8*  HCT 24.9* 25.4* 24.5*  MCV 86.8 89.1 91.1  MCH 29.3 29.5 29.0  MCHC 33.7 33.1 31.8  RDW 14.8 14.6 14.6  PLT 150 182 204   Thyroid  Recent Labs  Lab 02/11/22 2119  TSH 0.835    BNPNo results for input(s): "BNP", "PROBNP" in the last 168 hours.  DDimer No results for input(s): "DDIMER" in the last 168 hours.   Radiology    No results found.  Cardiac Studies   TTE 12/16/21  1. 25 mm bioprosthesis. MG 14 @ 78 bpm. Emax 2.5 m/s, EOA 1.14 cm2, DVI  2.6, PHT 72 ms. All suggest severe regurgitation. Based on prior TEE, MR  is severe. All parameters point to severe MR to explain elevated gradient.  The mitral valve has been  repaired/replaced. Severe mitral valve regurgitation. There is a 25 mm  Edwards Magna bioprosthetic valve present in the mitral position.  Procedure Date: 11/08/2014.   2. Left ventricular ejection fraction, by estimation, is 65 to 70%. The  left ventricle has normal function. The left ventricle has no regional  wall motion abnormalities. Left ventricular diastolic function could not  be evaluated.   3. Right ventricular systolic function is normal. The right ventricular  size is normal. Tricuspid regurgitation signal is inadequate for assessing  PA pressure.   4. Left atrial size was mild to moderately dilated.   5. The aortic valve is tricuspid. Aortic valve regurgitation is not  visualized. No aortic stenosis is present.   6. The inferior vena cava is normal in size with greater than 50%  respiratory variability, suggesting right atrial pressure of 3 mmHg.  Patient Profile     61 y.o. female status post mitral valve replacement who has complete heart block post MVR.  Assessment & Plan    1.  Complete heart block: Unfortunately heart block is continued.  I  adjusted her temporary pacer which showed no AV conduction.  She Elaine Le likely need pacemaker implant tomorrow.  We Elaine Le keep her n.p.o. tonight after  midnight.  2.  Status post mitral valve replacement: Plan per cardiac surgery.     For questions or updates, please contact [place] Please consult [email or web address] for contact info under        Signed, Elaine Le Elaine Leeds, MD  02/16/2022, 7:50 AM

## 2022-02-16 NOTE — Progress Notes (Addendum)
S:  NAEO looks very well in chair.  A sense V pace 80.  Net neg 1.5 L  O:  Vitals:   02/16/22 0720 02/16/22 0815  BP:    Pulse: 99   Resp: (!) 22   Temp:  98.8 F (37.1 C)  SpO2: 98%     Intake/Output Summary (Last 24 hours) at 02/16/2022 0959 Last data filed at 02/16/2022 0600 Gross per 24 hour  Intake 386.7 ml  Output 2035 ml  Net -1648.3 ml      Latest Ref Rng & Units 02/16/2022    4:29 AM 02/15/2022    5:52 AM 02/14/2022    6:16 AM  CBC  WBC 4.0 - 10.5 K/uL 7.8  10.7  16.0   Hemoglobin 12.0 - 15.0 g/dL 7.8  8.4  8.4   Hematocrit 36.0 - 46.0 % 24.5  25.4  24.9   Platelets 150 - 400 K/uL 204  182  150       Latest Ref Rng & Units 02/16/2022    4:29 AM 02/15/2022    5:52 AM 02/14/2022    6:16 AM  CMP  Glucose 70 - 99 mg/dL 103  89  123   BUN 8 - 23 mg/dL '7  9  9   '$ Creatinine 0.44 - 1.00 mg/dL 0.65  0.68  0.72   Sodium 135 - 145 mmol/L 136  134  132   Potassium 3.5 - 5.1 mmol/L 4.6  4.1  4.6   Chloride 98 - 111 mmol/L 98  95  96   CO2 22 - 32 mmol/L '29  29  28   '$ Calcium 8.9 - 10.3 mg/dL 7.9  8.0  8.2   Total Protein 6.5 - 8.1 g/dL   5.0   Total Bilirubin 0.3 - 1.2 mg/dL   0.7   Alkaline Phos 38 - 126 U/L   57   AST 15 - 41 U/L   30   ALT 0 - 44 U/L   14    Exam: NAD  A sense V pace 80 Resp nonlaboured Abd soft ntnd Extr wwp  I/o  3d ago neg 1 L 2d ago neg 1.2L Yesterday neg 1.5L  XR: ordered this AM, not yet completed.   A/P: 58F s/p redo MVR Awaiting PPM, otherwise well, labs wnl On fixed dose heparin gtt 20 lasix daily Dc foley today XR pending NPO p MN for Pacer tomorrow

## 2022-02-16 NOTE — Progress Notes (Signed)
NAME:  Elaine Le, MRN:  [id number], DOB:  [birth year], LOS: 5 ADMISSION DATE:  02/11/2022, CONSULTATION DATE:  02/11/2022 REFERRING MD:  Yolanda Manges, CHIEF COMPLAINT:  post-operative care.   History of Present Illness:  61 year old woman who presented for elective redo-MVR.   She has rheumatic heart disease and had a bioprosthetic MVR in 2017.  In the last year however (since having COVID) she has developed NYHA II-III dyspnea and lower extremity edema. Denied chest pain.  Work-up revealed severe MR with moderate PAH (sPAP 52)   She has seen Dr Shearon Stalls for COPD. Last seen in 09/2021.  PFT's showed moderate airflow obstruction.  She has OSA uses CPAP  Pertinent  Medical History   Past Medical History:  Diagnosis Date   Acute respiratory failure with hypercapnia ([place]) 99/35/7017   Acute systolic heart failure ([place]) 09/2014   Adenomatous colon polyp    Arthritis    Back pain    Chronic diastolic heart failure (HCC)    related to MR/MS   Chronic pain syndrome    Chronic sinusitis    COPD (chronic obstructive pulmonary disease)-PFT pending  07/17/2014   Diverticulosis    Dyspnea    Fibromyalgia    GERD (gastroesophageal reflux disease)    Headaches, cluster    Heart murmur    Hiatal hernia    Hx of adenomatous colonic polyps 03/10/2011   Oct 2012, repeat colon Oct 2017    Hypothyroidism    Internal hemorrhoids    Irritable bowel syndrome (IBS)    Mitral valve regurgitation    severe   Mitral valve stenosis, moderate 07/11/2014   Pneumonia    Feb. 24, 2016   Protein-calorie malnutrition, severe ([place]) 07/06/2014   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/10/2014   Severe mitral regurgitation with moderate mitral stenosis   S/P minimally invasive mitral valve replacement with bioprosthetic valve 11/08/2014   25 mm [facility] Mitral bovine bioprosthetic tissue valve placed via right mini thoracotomy approach   Sleep apnea    uses CPAP-setting 7   Tobacco abuse     Significant Hospital Events: Including procedures, antibiotic start and stop dates in addition to other pertinent events   10/17  Redo Mitral Valve replacement initially  using a 25 mm Mitris Pericardial valve which needed removal and placement of a 27 mm Mosaic Porcine valve 10/18 extubated; off pressors 10/20 awaiting PPM implantation   Interim History / Subjective:  Stated nausea has improved She slept about 4 hours at night now feeling sleepy  Objective   Blood pressure (!) 109/56, pulse 99, temperature 98.8 F (37.1 C), temperature source Oral, resp. rate (!) 22, height 5' (1.524 m), weight 60.4 kg, SpO2 98 %.        Intake/Output Summary (Last 24 hours) at 02/16/2022 0849 Last data filed at 02/16/2022 0600 Gross per 24 hour  Intake 386.7 ml  Output 2035 ml  Net -1648.3 ml   Filed Weights   02/14/22 0500 02/15/22 0500 02/16/22 0500  Weight: 60.1 kg 60.5 kg 60.4 kg   Examination: Physical exam: General: Middle-age female, lying on the bed HEENT: /AT, eyes anicteric.  moist mucus membranes Neuro: Alert, awake following commands Chest: Median sternotomy wound looks clean and dry, coarse breath sounds, no wheezes or rhonchi Heart: Paced rhythm, pansystolic murmurs best heard in mitral area Abdomen: Soft, nontender, nondistended, bowel sounds present Skin: No rash   Assessment & Plan:  Complete heart block post MVR Currently paced rhythm Scheduled for  PPM tomorrow N.p.o. past midnight Avoid AV nodal blocking agents  Status post redo MVR via median sternotomy. Chest pain is better Continue tramadol and gabapentin On IV heparin infusion at 400 units without titration per TCTS She is allergic to Coumadin  Cardiogenic and distributive shock post redo MVR. Improved Remain off vasopressors  Acute respiratory insufficiency postprocedure, resolved Acute hypoxic respiratory failure due to bilateral atelectasis Patient continues to require 2 L nasal cannula  oxygen, she desats when oxygen is turned off Encourage incentive spirometry Out of bed to chair PT/OT evaluation  GOLD stage II (moderate) COPD Well controlled on prn albuterol only   OSA  CPAP at night  Postop acute blood loss anemia, expected Thrombocytopenia resolved Hemoglobin is slowly drifting down, down from 8.4-7.8 Platelet counts are stable  Hyponatremia likely due to volume overload, resolved Closely monitor intake and output  Hypothyroidism Continue home liothyroxine and levothyroxine.  Best Practice (right click and "Reselect all SmartList Selections" daily)   Diet/type: Regular consistency DVT prophylaxis: Systemic heparin GI prophylaxis: Protonix Lines: NA Foley:  Yes, and it is still needed Code Status:  full code Last date of multidisciplinary goals of care discussion [updated patient at bedside]     Jacky Kindle, MD [place] See Amion for pager If no response to pager, please call [phone] until 7pm After 7pm, Please call E-link [phone]

## 2022-02-16 NOTE — Progress Notes (Signed)
[place] for heparin Indication:  mitral valve replacement  Allergies  Allergen Reactions   Estrace [Estradiol] Hives    Name Brand can be used    Reglan [Metoclopramide] Hives   Warfarin And Related Hives and Rash   Other Hives and Other (See Comments)    Any antidepressants per pt- causes altered mental state, anger Other reaction(s): rash   Morphine And Related Other (See Comments)    Altered mental state-Patient reports she "made satan look sweet"     Singulair [Montelukast Sodium] Other (See Comments)    Intolerance - effects her mood    Statins Other (See Comments)    Myalgias with several statins    Patient Measurements: Height: 5' (152.4 cm) Weight: 60.4 kg (133 lb 2.5 oz) IBW/kg (Calculated) : 45.5 Heparin Dosing Weight: 50.8 kg   Vital Signs: Temp: 99.2 F (37.3 C) (10/21 2300) Temp Source: Oral (10/21 2300) BP: 109/56 (10/22 0700) Pulse Rate: 99 (10/22 0720)  Labs: Recent Labs    02/14/22 0616 02/14/22 2051 02/15/22 0552 02/16/22 0429  HGB 8.4*  --  8.4* 7.8*  HCT 24.9*  --  25.4* 24.5*  PLT 150  --  182 204  HEPARINUNFRC  --  <0.10* <0.10* <0.10*  CREATININE 0.72  --  0.68 0.65     Estimated Creatinine Clearance: 60 mL/min (by C-G formula based on SCr of 0.65 mg/dL).   Medical History: Past Medical History:  Diagnosis Date   Acute respiratory failure with hypercapnia ([place]) 85/88/5027   Acute systolic heart failure (HCC) 09/2014   Adenomatous colon polyp    Arthritis    Back pain    Chronic diastolic heart failure (HCC)    related to MR/MS   Chronic pain syndrome    Chronic sinusitis    COPD (chronic obstructive pulmonary disease)-PFT pending  07/17/2014   Diverticulosis    Dyspnea    Fibromyalgia    GERD (gastroesophageal reflux disease)    Headaches, cluster    Heart murmur    Hiatal hernia    Hx of adenomatous colonic polyps 03/10/2011   Oct 2012, repeat colon Oct 2017    Hypothyroidism     Internal hemorrhoids    Irritable bowel syndrome (IBS)    Mitral valve regurgitation    severe   Mitral valve stenosis, moderate 07/11/2014   Pneumonia    Feb. 24, 2016   Protein-calorie malnutrition, severe ([place]) 07/06/2014   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/10/2014   Severe mitral regurgitation with moderate mitral stenosis   S/P minimally invasive mitral valve replacement with bioprosthetic valve 11/08/2014   25 mm Edwards Magna Mitral bovine bioprosthetic tissue valve placed via right mini thoracotomy approach   Sleep apnea    uses CPAP-setting 7   Tobacco abuse     Medications:  Scheduled:   acetaminophen  1,000 mg Oral Q6H   Or   acetaminophen (TYLENOL) oral liquid 160 mg/5 mL  1,000 mg Per Tube Q6H   aspirin EC  81 mg Oral Daily   Or   aspirin  81 mg Per Tube Daily   bisacodyl  10 mg Oral Daily   Or   bisacodyl  10 mg Rectal Daily   Chlorhexidine Gluconate Cloth  6 each Topical Daily   docusate sodium  200 mg Oral Daily   fluticasone  2 spray Each Nare QHS   gabapentin  300 mg Oral TID   insulin aspart  0-15 Units Subcutaneous TID WC  insulin aspart  0-5 Units Subcutaneous QHS   levothyroxine  75 mcg Oral QAC breakfast   liothyronine  5 mcg Oral Daily   pantoprazole  40 mg Oral Daily   sodium chloride flush  3 mL Intravenous Q12H    Assessment: 17 yof who underwent redo mitral valve on 10/17 - of note, allergy to warfarin (hives/rash). Given unable to use warfarin, plan per conversation with Dr Lavonna Monarch, will use apixaban once cleared by EP after PPM.  MD would like to cover with heparin until PPM - given recent surgery, will do fixed rate at 400 units/hr (~8 units/kg/hr) and not titrate unless elevated.   Heparin level remains <0.1 as expected, CBC stable. Discussed with nursing, no S/Sx bleeding since heparin started. PPM planned tomorrow.  Goal of Therapy:  Heparin level <0.3 - do not titrate unless cleared with MD Monitor platelets by anticoagulation  protocol: Yes   Plan:  Heparin 400 units/h Daily heparin level and CBC  Arrie Senate, PharmD, [place], [facility] Clinical Pharmacist [phone] Please check AMION for all [place] numbers 02/16/2022

## 2022-02-17 ENCOUNTER — Encounter (HOSPITAL_COMMUNITY)
Admission: RE | Disposition: A | Discharge status: Home / Self Care | Payer: Self-pay | Source: Ambulatory Visit | Attending: Thoracic Surgery (Cardiothoracic Vascular Surgery)

## 2022-02-17 ENCOUNTER — Inpatient Hospital Stay (HOSPITAL_COMMUNITY): Payer: Medicare Other

## 2022-02-17 DIAGNOSIS — I442 Atrioventricular block, complete: Secondary | ICD-10-CM | POA: Diagnosis not present

## 2022-02-17 DIAGNOSIS — J90 Pleural effusion, not elsewhere classified: Secondary | ICD-10-CM | POA: Diagnosis not present

## 2022-02-17 HISTORY — PX: PACEMAKER IMPLANT: EP1218

## 2022-02-17 LAB — PREPARE RBC (CROSSMATCH)

## 2022-02-17 LAB — GLUCOSE, CAPILLARY
Glucose-Capillary: 117 mg/dL — ABNORMAL HIGH (ref 70–99)
Glucose-Capillary: 115 mg/dL — ABNORMAL HIGH (ref 70–99)
Glucose-Capillary: 190 mg/dL — ABNORMAL HIGH (ref 70–99)
Glucose-Capillary: 175 mg/dL — ABNORMAL HIGH (ref 70–99)
Glucose-Capillary: 130 mg/dL — ABNORMAL HIGH (ref 70–99)

## 2022-02-17 LAB — CBC
HCT: 24 % — ABNORMAL LOW (ref 36.0–46.0)
Hemoglobin: 7.7 g/dL — ABNORMAL LOW (ref 12.0–15.0)
MCH: 29.5 pg (ref 26.0–34.0)
MCHC: 32.1 g/dL (ref 30.0–36.0)
MCV: 92 fL (ref 80.0–100.0)
Platelets: 241 10*3/uL (ref 150–400)
RBC: 2.61 MIL/uL — ABNORMAL LOW (ref 3.87–5.11)
RDW: 14.7 % (ref 11.5–15.5)
WBC: 7.9 10*3/uL (ref 4.0–10.5)
nRBC: 0 % (ref 0.0–0.2)

## 2022-02-17 LAB — BASIC METABOLIC PANEL
Anion gap: 6 (ref 5–15)
BUN: 6 mg/dL — ABNORMAL LOW (ref 8–23)
CO2: 29 mmol/L (ref 22–32)
Calcium: 7.8 mg/dL — ABNORMAL LOW (ref 8.9–10.3)
Chloride: 102 mmol/L (ref 98–111)
Creatinine, Ser: 0.55 mg/dL (ref 0.44–1.00)
GFR, Estimated: >60 mL/min (ref >60)
Glucose, Bld: 125 mg/dL — ABNORMAL HIGH (ref 70–99)
Potassium: 4 mmol/L (ref 3.5–5.1)
Sodium: 137 mmol/L (ref 135–145)

## 2022-02-17 LAB — HEPARIN LEVEL (UNFRACTIONATED): Heparin Unfractionated: <0.1 IU/mL — ABNORMAL LOW (ref 0.30–0.70)

## 2022-02-17 SURGERY — PACEMAKER IMPLANT

## 2022-02-17 MED ORDER — MIDAZOLAM HCL 5 MG/5ML IJ SOLN
INTRAMUSCULAR | Status: AC
  Filled 2022-02-17: qty 5

## 2022-02-17 MED ORDER — HEPARIN (PORCINE) IN NACL 1000-0.9 UT/500ML-% IV SOLN
INTRAVENOUS | Status: DC | PRN
  Administered 2022-02-17: 500 mL

## 2022-02-17 MED ORDER — SODIUM CHLORIDE 0.9% FLUSH: 3.0000 mL | INTRAVENOUS | Status: DC | PRN

## 2022-02-17 MED ORDER — SODIUM CHLORIDE 0.9% IV SOLUTION: Freq: Once | INTRAVENOUS | Status: DC

## 2022-02-17 MED ORDER — ACETAMINOPHEN 325 MG PO TABS
325.0000 mg | ORAL_TABLET | ORAL | Status: DC | PRN
  Administered 2022-02-19 – 2022-02-20 (×2): 650 mg via ORAL
  Filled 2022-02-17 (×2): qty 2

## 2022-02-17 MED ORDER — HEPARIN (PORCINE) IN NACL 1000-0.9 UT/500ML-% IV SOLN
INTRAVENOUS | Status: AC
  Filled 2022-02-17: qty 500

## 2022-02-17 MED ORDER — LIDOCAINE HCL (PF) 1 % IJ SOLN
INTRAMUSCULAR | Status: DC | PRN
  Administered 2022-02-17: 60 mL

## 2022-02-17 MED ORDER — CHLORHEXIDINE GLUCONATE 4 % EX LIQD: 60.0000 mL | Freq: Once | CUTANEOUS | Status: DC

## 2022-02-17 MED ORDER — SODIUM CHLORIDE 0.9 % IV SOLN
INTRAVENOUS | Status: AC
  Filled 2022-02-17: qty 2

## 2022-02-17 MED ORDER — FUROSEMIDE 40 MG PO TABS
40.0000 mg | ORAL_TABLET | Freq: Two times a day (BID) | ORAL | Status: DC
  Administered 2022-02-17 – 2022-02-18 (×3): 40 mg via ORAL
  Filled 2022-02-17 (×3): qty 1

## 2022-02-17 MED ORDER — LIDOCAINE HCL (PF) 1 % IJ SOLN
INTRAMUSCULAR | Status: AC
  Filled 2022-02-17: qty 60

## 2022-02-17 MED ORDER — FENTANYL CITRATE (PF) 100 MCG/2ML IJ SOLN
INTRAMUSCULAR | Status: AC
  Filled 2022-02-17: qty 2

## 2022-02-17 MED ORDER — SODIUM CHLORIDE 0.9 % IV SOLN: INTRAVENOUS | Status: DC

## 2022-02-17 MED ORDER — MIDAZOLAM HCL 5 MG/5ML IJ SOLN
INTRAMUSCULAR | Status: DC | PRN
  Administered 2022-02-17: 1 mg via INTRAVENOUS

## 2022-02-17 MED ORDER — FENTANYL CITRATE (PF) 100 MCG/2ML IJ SOLN
INTRAMUSCULAR | Status: DC | PRN
  Administered 2022-02-17: 25 ug via INTRAVENOUS

## 2022-02-17 MED ORDER — SODIUM CHLORIDE 0.9% FLUSH: 3.0000 mL | Freq: Two times a day (BID) | INTRAVENOUS | Status: DC

## 2022-02-17 MED ORDER — CEFAZOLIN SODIUM-DEXTROSE 2-4 GM/100ML-% IV SOLN
INTRAVENOUS | Status: AC
  Filled 2022-02-17: qty 100

## 2022-02-17 MED ORDER — CEFAZOLIN SODIUM-DEXTROSE 2-4 GM/100ML-% IV SOLN
2.0000 g | INTRAVENOUS | Status: AC
  Administered 2022-02-17: 2 g via INTRAVENOUS

## 2022-02-17 MED ORDER — CEFAZOLIN SODIUM-DEXTROSE 1-4 GM/50ML-% IV SOLN
1.0000 g | Freq: Four times a day (QID) | INTRAVENOUS | Status: AC
  Administered 2022-02-17 – 2022-02-18 (×3): 1 g via INTRAVENOUS
  Filled 2022-02-17 (×3): qty 50

## 2022-02-17 MED ORDER — SODIUM CHLORIDE 0.9 % IV SOLN: 250.0000 mL | INTRAVENOUS | Status: DC

## 2022-02-17 MED ORDER — POTASSIUM CHLORIDE CRYS ER 20 MEQ PO TBCR
20.0000 meq | EXTENDED_RELEASE_TABLET | Freq: Two times a day (BID) | ORAL | Status: AC
  Administered 2022-02-17 – 2022-02-18 (×3): 20 meq via ORAL
  Filled 2022-02-17 (×3): qty 1

## 2022-02-17 MED ORDER — SODIUM CHLORIDE 0.9 % IV SOLN
80.0000 mg | INTRAVENOUS | Status: AC
  Administered 2022-02-17: 80 mg

## 2022-02-17 SURGICAL SUPPLY — 15 items
CABLE SURGICAL S-101-97-12 (CABLE) ×2 IMPLANT
KIT ACCESSORY SELECTRA FIX CVD (MISCELLANEOUS) IMPLANT
KIT MICROPUNCTURE NIT STIFF (SHEATH) IMPLANT
LEAD SELECTRA 3D-55-42 (CATHETERS) IMPLANT
LEAD SOLIA S PRO MRI 53 (Lead) IMPLANT
LEAD SOLIA S PRO MRI 60 (Lead) IMPLANT
PACEMAKER EDORA 8DR-T MRI (Pacemaker) IMPLANT
PAD DEFIB RADIO PHYSIO CONN (PAD) ×2 IMPLANT
POUCH AIGIS-R ANTIBACT PPM (Mesh General) ×1 IMPLANT
POUCH AIGIS-R ANTIBACT PPM MED (Mesh General) IMPLANT
SHEATH 7FR PRELUDE SNAP 13 (SHEATH) IMPLANT
SHEATH 9FR PRELUDE SNAP 13 (SHEATH) IMPLANT
SHEATH PROBE COVER 6X72 (BAG) IMPLANT
TRAY PACEMAKER INSERTION (PACKS) ×2 IMPLANT
WIRE HI TORQ VERSACORE-J 145CM (WIRE) IMPLANT

## 2022-02-17 NOTE — Progress Notes (Signed)
NAME:  Elaine Le, MRN:  [id number], DOB:  08-Mar-1961, LOS: 6 ADMISSION DATE:  02/11/2022, CONSULTATION DATE:  02/11/2022 REFERRING MD:  Yolanda Manges, CHIEF COMPLAINT:  post-operative care.   History of Present Illness:  61 year old woman who presented for elective redo-MVR.   She has rheumatic heart disease and had a bioprosthetic MVR in 2017.  In the last year however (since having COVID) she has developed NYHA II-III dyspnea and lower extremity edema. Denied chest pain.  Work-up revealed severe MR with moderate PAH (sPAP 52)   She has seen Dr Shearon Stalls for COPD. Last seen in 09/2021.  PFT's showed moderate airflow obstruction.  She has OSA uses CPAP  Pertinent  Medical History   Past Medical History:  Diagnosis Date   Acute respiratory failure with hypercapnia ([place]) 35/46/5681   Acute systolic heart failure ([place]) 09/2014   Adenomatous colon polyp    Arthritis    Back pain    Chronic diastolic heart failure (HCC)    related to MR/MS   Chronic pain syndrome    Chronic sinusitis    COPD (chronic obstructive pulmonary disease)-PFT pending  07/17/2014   Diverticulosis    Dyspnea    Fibromyalgia    GERD (gastroesophageal reflux disease)    Headaches, cluster    Heart murmur    Hiatal hernia    Hx of adenomatous colonic polyps 03/10/2011   Oct 2012, repeat colon Oct 2017    Hypothyroidism    Internal hemorrhoids    Irritable bowel syndrome (IBS)    Mitral valve regurgitation    severe   Mitral valve stenosis, moderate 07/11/2014   Pneumonia    Feb. 24, 2016   Protein-calorie malnutrition, severe ([place]) 07/06/2014   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/10/2014   Severe mitral regurgitation with moderate mitral stenosis   S/P minimally invasive mitral valve replacement with bioprosthetic valve 11/08/2014   25 mm [facility] Mitral bovine bioprosthetic tissue valve placed via right mini thoracotomy approach   Sleep apnea    uses CPAP-setting 7   Tobacco abuse     Significant Hospital Events: Including procedures, antibiotic start and stop dates in addition to other pertinent events   10/17  Redo Mitral Valve replacement initially  using a 25 mm Mitris Pericardial valve which needed removal and placement of a 27 mm Mosaic Porcine valve 10/18 extubated; off pressors 10/20 awaiting PPM implantation   Interim History / Subjective:  No events. Pain under control. Remains paced. Foley still in place.  Objective   Blood pressure 109/62, pulse 91, temperature 98.7 F (37.1 C), temperature source Oral, resp. rate 13, height 5' (1.524 m), weight 60.4 kg, SpO2 100 %.        Intake/Output Summary (Last 24 hours) at 02/17/2022 0747 Last data filed at 02/17/2022 0400 Gross per 24 hour  Intake 731.96 ml  Output 2575 ml  Net -1843.04 ml    Filed Weights   02/14/22 0500 02/15/22 0500 02/16/22 0500  Weight: 60.1 kg 60.5 kg 60.4 kg   Examination: No distress Heart sounds regular, paced on monitor No edema Moves all 4 ext to command Sternotomy incision CDI, healing well Moves all 4 ext to command Global mild weakness Aox3  Hgb stable Cr stable  Assessment & Plan:  Complete heart block post MVR Currently paced rhythm Scheduled for PPM today, NPO  Status post redo MVR via median sternotomy. Chest pain is better Continue tramadol and gabapentin On IV heparin infusion at 400 units  without titration per TCTS She is allergic to Coumadin  Cardiogenic and distributive shock post redo MVR. Improved Remains off vasopressors  Mild hypoxemia due to bilateral atelectasis Continue IS, should resolve with mobility  GOLD stage II (moderate) COPD Well controlled on prn albuterol only   OSA  CPAP at night  Postop acute blood loss anemia, expected Thrombocytopenia resolved Transfusion targets per primary  Hypothyroidism Continue home liothyroxine and levothyroxine.  Best Practice (right click and "Reselect all SmartList Selections"  daily)   Diet/type: NPO for procedure DVT prophylaxis: Systemic heparin GI prophylaxis: Protonix Lines: NA Foley:  Yes, and it is still needed Code Status:  full code Last date of multidisciplinary goals of care discussion [updated patient at bedside]     Erskine Emery, MD [place] See Amion for pager If no response to pager, please call [phone] until 7pm After 7pm, Please call E-link [phone]

## 2022-02-17 NOTE — Procedures (Signed)
Thoracentesis  Procedure Note  Elaine Le  [id number]  1961-03-15  Date:02/17/22  Time:11:54 AM   Provider Performing:Licet Dunphy C Tamala Julian   Procedure: Thoracentesis with imaging guidance (83338)  Indication(s) Pleural Effusion  Consent Risks of the procedure as well as the alternatives and risks of each were explained to the patient and/or caregiver.  Consent for the procedure was obtained and is signed in the bedside chart  Anesthesia Topical only with 1% lidocaine    Time Out Verified patient identification, verified procedure, site/side was marked, verified correct patient position, special equipment/implants available, medications/allergies/relevant history reviewed, required imaging and test results available.   Sterile Technique Maximal sterile technique including full sterile barrier drape, hand hygiene, sterile gown, sterile gloves, mask, hair covering, sterile ultrasound probe cover (if used).  Procedure Description Ultrasound was used to identify appropriate pleural anatomy for placement and overlying skin marked.  Area of drainage cleaned and draped in sterile fashion. Lidocaine was used to anesthetize the skin and subcutaneous tissue.  300 cc's of bloody appearing fluid was drained from the left pleural space. Catheter then removed and bandaid applied to site.   Complications/Tolerance None; patient tolerated the procedure well. Chest X-ray is ordered to confirm no post-procedural complication.   EBL Minimal   Specimen(s) None

## 2022-02-17 NOTE — Progress Notes (Signed)
      [address]       [place] [address]             [phone]      Sleeping at present  BP 116/64   Pulse 97   Temp 98 F (36.7 C) (Oral)   Resp 17   Ht 5' (1.524 m)   Wt 60.4 kg   SpO2 100%   BMI 26.01 kg/m   Intake/Output Summary (Last 24 hours) at 02/17/2022 1735 Last data filed at 02/17/2022 1200 Gross per 24 hour  Intake 542.92 ml  Output 4075 ml  Net -3532.08 ml   Thoracentesis earlier today with minimal fluid drained  Remo Lipps C. Roxan Hockey, MD [facility] [phone]

## 2022-02-17 NOTE — Progress Notes (Signed)
[address]       [employer] [address]             [phone]      6 Days Post-Op  Procedure(s) (LRB): REDO MITRAL VALVE REPLACEMENT (MVR) VIA STERNOTOMY USING 27 MM MOSAIC PORCINE MITRAL VALVE (N/A) TRANSESOPHAGEAL ECHOCARDIOGRAM (TEE) (N/A)   Total Length of Stay:  LOS: 6 days    SUBJECTIVE: Comfortable sitting in chair  Vitals:   02/17/22 0640 02/17/22 0645  BP:  109/62  Pulse:  91  Resp:  13  Temp: 98.7 F (37.1 C)   SpO2:  100%    Intake/Output      10/22 0701 10/23 0700 10/23 0701 10/24 0700   P.O. 840    I.V. (mL/kg) 92 (1.5)    IV Piggyback     Total Intake(mL/kg) 932 (15.4)    Urine (mL/kg/hr) 2575 (1.8)    Stool 0    Total Output 2575    Net -1643         Urine Occurrence 1 x    Stool Occurrence 1 x        sodium chloride     sodium chloride     sodium chloride Stopped (02/13/22 0526)   lactated ringers     lactated ringers     lactated ringers Stopped (02/13/22 1006)   promethazine (PHENERGAN) injection (IM or IVPB) Stopped (02/15/22 2228)    CBC    Component Value Date/Time   WBC 7.9 02/17/2022 0430   RBC 2.61 (L) 02/17/2022 0430   HGB 7.7 (L) 02/17/2022 0430   HGB 12.5 12/23/2021 1117   HCT 24.0 (L) 02/17/2022 0430   HCT 37.4 12/23/2021 1117   PLT 241 02/17/2022 0430   PLT 255 12/23/2021 1117   MCV 92.0 02/17/2022 0430   MCV 86 12/23/2021 1117   MCH 29.5 02/17/2022 0430   MCHC 32.1 02/17/2022 0430   RDW 14.7 02/17/2022 0430   RDW 12.6 12/23/2021 1117   LYMPHSABS 1.5 06/26/2020 1124   MONOABS 0.4 06/26/2020 1124   EOSABS 0.1 06/26/2020 1124   BASOSABS 0.1 06/26/2020 1124   CMP     Component Value Date/Time   NA 137 02/17/2022 0430   NA 141 12/23/2021 1117   K 4.0 02/17/2022 0430   CL 102 02/17/2022 0430   CO2 29 02/17/2022 0430   GLUCOSE 125 (H) 02/17/2022 0430   BUN 6 (L) 02/17/2022 0430   BUN 7 (L) 12/23/2021 1117   CREATININE 0.55 02/17/2022 0430   CALCIUM 7.8 (L) 02/17/2022 0430   PROT  5.0 (L) 02/14/2022 0616   ALBUMIN 3.1 (L) 02/14/2022 0616   AST 30 02/14/2022 0616   ALT 14 02/14/2022 0616   ALKPHOS 57 02/14/2022 0616   BILITOT 0.7 02/14/2022 0616   GFRNONAA >60 02/17/2022 0430   GFRAA >60 04/02/2019 2059   ABG    Component Value Date/Time   PHART 7.396 02/12/2022 0501   PCO2ART 36.9 02/12/2022 0501   PO2ART 140 (H) 02/12/2022 0501   HCO3 22.8 02/12/2022 0501   TCO2 24 02/12/2022 0501   ACIDBASEDEF 2.0 02/12/2022 0501   O2SAT 66 02/13/2022 0343   CBG (last 3)  Recent Labs    02/16/22 1601 02/16/22 2114 02/17/22 0640  GLUCAP 108* 124* 117*   EXAM Lungs: overall clear Card: RR soft murmur Ext: warm and dry   ASSESSMENT: POD #6 SP Redo MVR For PPM today Transfuse if needed by cards Needs diuresis On heparin  for now. Transition to NOAC once PPM in   [name], MD '@DATE'$ @

## 2022-02-17 NOTE — TOC CM/SW Note (Signed)
TOC CM spoke to pt and states she does not feels she will need HH. States she had Cardiac Rehab after her last surgery. States her adult dtr and parents will assist with care after dc. Will continue to follow for dc needs. Pt states she was independent PTA. Jonnie Finner RN CCM Case Mgmt phone [phone]

## 2022-02-17 NOTE — Progress Notes (Addendum)
Rounding Note    Patient Name: Elaine Le Date of Encounter: 02/17/2022  [place] Cardiologist: Minus Breeding, MD   Subjective   Short of breath today in chair. Family at bedside.  Inpatient Medications    Scheduled Meds:  aspirin EC  81 mg Oral Daily   Or   aspirin  81 mg Per Tube Daily   bisacodyl  10 mg Oral Daily   Or   bisacodyl  10 mg Rectal Daily   Chlorhexidine Gluconate Cloth  6 each Topical Daily   docusate sodium  200 mg Oral Daily   fluticasone  2 spray Each Nare QHS   furosemide  20 mg Oral Daily   insulin aspart  0-15 Units Subcutaneous TID WC   insulin aspart  0-5 Units Subcutaneous QHS   levothyroxine  75 mcg Oral QAC breakfast   liothyronine  5 mcg Oral Daily   pantoprazole  40 mg Oral Daily   sodium chloride flush  10-40 mL Intracatheter Q12H   sodium chloride flush  3 mL Intravenous Q12H   Continuous Infusions:  sodium chloride     sodium chloride     sodium chloride Stopped (02/13/22 0526)   lactated ringers     lactated ringers     lactated ringers Stopped (02/13/22 1006)   promethazine (PHENERGAN) injection (IM or IVPB) Stopped (02/15/22 2228)   PRN Meds: sodium chloride, alum & mag hydroxide-simeth, fentaNYL (SUBLIMAZE) injection, ipratropium-albuterol **AND** [COMPLETED] ipratropium-albuterol, lactated ringers, ondansetron (ZOFRAN) IV, ondansetron, mouth rinse, promethazine (PHENERGAN) injection (IM or IVPB), sodium chloride flush, sodium chloride flush, traMADol   Vital Signs    Vitals:   02/17/22 0300 02/17/22 0400 02/17/22 0640 02/17/22 0645  BP: (!) 112/54 (!) 83/69  109/62  Pulse: 98 95  91  Resp: 15 (!) 24  13  Temp: 98.6 F (37 C)  98.7 F (37.1 C)   TempSrc: Oral  Oral   SpO2: 98% 100%  100%  Weight:      Height:        Intake/Output Summary (Last 24 hours) at 02/17/2022 0745 Last data filed at 02/17/2022 0400 Gross per 24 hour  Intake 731.96 ml  Output 2575 ml  Net -1843.04 ml        02/16/2022    5:00 AM 02/15/2022    5:00 AM 02/14/2022    5:00 AM  Last 3 Weights  Weight (lbs) 133 lb 2.5 oz 133 lb 6.1 oz 132 lb 7.9 oz  Weight (kg) 60.4 kg 60.5 kg 60.1 kg      Telemetry    Sinus rhythm, ventricular paced- Personally Reviewed  ECG    Personally Reviewed  Physical Exam   GEN: Well nourished, well developed, in no acute distress. Mild IWOB. HEENT: normal  Neck: no JVD, carotid bruits, or masses Cardiac: RRR; no murmurs, rubs, or gallops,no edema  Respiratory:  clear to auscultation bilaterally, normal work of breathing GI: soft, nontender, nondistended, + BS MS: no deformity or atrophy  Skin: warm and dry, well-healing median sternotomy scar  Neuro:  Strength and sensation are intact Psych: euthymic mood, full affect   Labs    High Sensitivity Troponin:  No results for input(s): "TROPONINIHS" in the last 720 hours.   Chemistry Recent Labs  Lab 02/12/22 1416 02/12/22 1804 02/13/22 0343 02/14/22 0616 02/15/22 0552 02/16/22 0429 02/17/22 0430  NA  --    < > 136 132* 134* 136 137  K  --    < > 4.7  4.6 4.1 4.6 4.0  CL  --    < > 105 96* 95* 98 102  CO2  --    < > '24 28 29 29 29  '$ GLUCOSE  --    < > 128* 123* 89 103* 125*  BUN  --    < > 5* 9 9 7* 6*  CREATININE  --    < > 0.62 0.72 0.68 0.65 0.55  CALCIUM  --    < > 7.7* 8.2* 8.0* 7.9* 7.8*  MG  --    < > 2.2 2.1  --  2.1  --   PROT 4.7*  --  4.3* 5.0*  --   --   --   ALBUMIN 3.6  --  3.0* 3.1*  --   --   --   AST 66*  --  48* 30  --   --   --   ALT 17  --  14 14  --   --   --   ALKPHOS 34*  --  47 57  --   --   --   BILITOT 0.8  --  0.5 0.7  --   --   --   GFRNONAA  --    < > >60 >60 >60 >60 >60  ANIONGAP  --    < > '7 8 10 9 6   '$ < > = values in this interval not displayed.     Lipids No results for input(s): "CHOL", "TRIG", "HDL", "LABVLDL", "LDLCALC", "CHOLHDL" in the last 168 hours.  Hematology Recent Labs  Lab 02/15/22 0552 02/16/22 0429 02/17/22 0430  WBC 10.7* 7.8 7.9  RBC 2.85*  2.69* 2.61*  HGB 8.4* 7.8* 7.7*  HCT 25.4* 24.5* 24.0*  MCV 89.1 91.1 92.0  MCH 29.5 29.0 29.5  MCHC 33.1 31.8 32.1  RDW 14.6 14.6 14.7  PLT 182 204 241    Thyroid  Recent Labs  Lab 02/11/22 2119  TSH 0.835     BNPNo results for input(s): "BNP", "PROBNP" in the last 168 hours.  DDimer No results for input(s): "DDIMER" in the last 168 hours.   Radiology    No results found.  Cardiac Studies   TTE 12/16/21  1. 25 mm bioprosthesis. MG 14 @ 78 bpm. Emax 2.5 m/s, EOA 1.14 cm2, DVI  2.6, PHT 72 ms. All suggest severe regurgitation. Based on prior TEE, MR  is severe. All parameters point to severe MR to explain elevated gradient.  The mitral valve has been  repaired/replaced. Severe mitral valve regurgitation. There is a 25 mm  Edwards Magna bioprosthetic valve present in the mitral position.  Procedure Date: 11/08/2014.   2. Left ventricular ejection fraction, by estimation, is 65 to 70%. The  left ventricle has normal function. The left ventricle has no regional  wall motion abnormalities. Left ventricular diastolic function could not  be evaluated.   3. Right ventricular systolic function is normal. The right ventricular  size is normal. Tricuspid regurgitation signal is inadequate for assessing  PA pressure.   4. Left atrial size was mild to moderately dilated.   5. The aortic valve is tricuspid. Aortic valve regurgitation is not  visualized. No aortic stenosis is present.   6. The inferior vena cava is normal in size with greater than 50%  respiratory variability, suggesting right atrial pressure of 3 mmHg.  Patient Profile     61 y.o. female status post mitral valve replacement who has complete heart block post MVR.  Assessment & Plan    #CHB Temp epi wire threshold increasing, now 6. I have reprogrammed her output for an appropriate margin. - she will require PPM. - transfuse PRBC today for target Hgb>8 to see if this helps dyspnea. - repeat CXR this AM - plan  to reassess her clinical status this afternoon to finalize decision re: PPM timing. She will have to be able to lay flat for 1-1.5 hours for the procedure. - post-op MVR TEE note states LVEF preserved  #MVR Plan per cardiac surgery.      For questions or updates, please contact [place] Please consult [email or web address] for contact info under     Mamie Levers, NP 02/17/22 10:13 AM

## 2022-02-17 NOTE — Procedures (Signed)
Thoracentesis  Procedure Note  ALEXSA FLAUM  [id number]  07-22-1960  Date:02/17/22  Time:11:54 AM   Provider Performing:Leocadia Idleman C Tamala Julian   Procedure: Thoracentesis with imaging guidance (40981)  Indication(s) Pleural Effusion  Consent Risks of the procedure as well as the alternatives and risks of each were explained to the patient and/or caregiver.  Consent for the procedure was obtained and is signed in the bedside chart  Anesthesia Topical only with 1% lidocaine    Time Out Verified patient identification, verified procedure, site/side was marked, verified correct patient position, special equipment/implants available, medications/allergies/relevant history reviewed, required imaging and test results available.   Sterile Technique Maximal sterile technique including full sterile barrier drape, hand hygiene, sterile gown, sterile gloves, mask, hair covering, sterile ultrasound probe cover (if used).  Procedure Description Ultrasound was used to identify appropriate pleural anatomy for placement and overlying skin marked.  Area of drainage cleaned and draped in sterile fashion. Lidocaine was used to anesthetize the skin and subcutaneous tissue.  600 cc's of bloody appearing fluid was drained from the right pleural space. Catheter then removed and bandaid applied to site.   Complications/Tolerance None; patient tolerated the procedure well. Chest X-ray is ordered to confirm no post-procedural complication.   EBL Minimal   Specimen(s) None

## 2022-02-18 ENCOUNTER — Inpatient Hospital Stay (HOSPITAL_COMMUNITY): Payer: Medicare Other

## 2022-02-18 ENCOUNTER — Encounter (HOSPITAL_COMMUNITY): Payer: Self-pay | Admitting: Cardiology

## 2022-02-18 DIAGNOSIS — Z952 Presence of prosthetic heart valve: Secondary | ICD-10-CM | POA: Diagnosis not present

## 2022-02-18 LAB — BASIC METABOLIC PANEL
Anion gap: 6 (ref 5–15)
BUN: <5 mg/dL — ABNORMAL LOW (ref 8–23)
CO2: 31 mmol/L (ref 22–32)
Calcium: 8 mg/dL — ABNORMAL LOW (ref 8.9–10.3)
Chloride: 99 mmol/L (ref 98–111)
Creatinine, Ser: 0.58 mg/dL (ref 0.44–1.00)
GFR, Estimated: >60 mL/min (ref >60)
Glucose, Bld: 134 mg/dL — ABNORMAL HIGH (ref 70–99)
Potassium: 4.2 mmol/L (ref 3.5–5.1)
Sodium: 136 mmol/L (ref 135–145)

## 2022-02-18 LAB — CBC
HCT: 23.1 % — ABNORMAL LOW (ref 36.0–46.0)
Hemoglobin: 7.4 g/dL — ABNORMAL LOW (ref 12.0–15.0)
MCH: 29.4 pg (ref 26.0–34.0)
MCHC: 32 g/dL (ref 30.0–36.0)
MCV: 91.7 fL (ref 80.0–100.0)
Platelets: 253 10*3/uL (ref 150–400)
RBC: 2.52 MIL/uL — ABNORMAL LOW (ref 3.87–5.11)
RDW: 15 % (ref 11.5–15.5)
WBC: 9.1 10*3/uL (ref 4.0–10.5)
nRBC: 0 % (ref 0.0–0.2)

## 2022-02-18 LAB — GLUCOSE, CAPILLARY
Glucose-Capillary: 112 mg/dL — ABNORMAL HIGH (ref 70–99)
Glucose-Capillary: 132 mg/dL — ABNORMAL HIGH (ref 70–99)
Glucose-Capillary: 128 mg/dL — ABNORMAL HIGH (ref 70–99)
Glucose-Capillary: 105 mg/dL — ABNORMAL HIGH (ref 70–99)

## 2022-02-18 MED ORDER — FUROSEMIDE 10 MG/ML IJ SOLN
40.0000 mg | Freq: Once | INTRAMUSCULAR | Status: AC
  Administered 2022-02-18: 40 mg via INTRAVENOUS
  Filled 2022-02-18: qty 4

## 2022-02-18 MED ORDER — FUROSEMIDE 40 MG PO TABS
40.0000 mg | ORAL_TABLET | Freq: Two times a day (BID) | ORAL | Status: DC
  Administered 2022-02-19 (×2): 40 mg via ORAL
  Filled 2022-02-18 (×2): qty 1

## 2022-02-18 MED ORDER — METOPROLOL TARTRATE 12.5 MG HALF TABLET
12.5000 mg | ORAL_TABLET | Freq: Two times a day (BID) | ORAL | Status: DC
  Administered 2022-02-18 – 2022-02-21 (×6): 12.5 mg via ORAL
  Filled 2022-02-18 (×8): qty 1

## 2022-02-18 NOTE — Progress Notes (Signed)
      [address]       [place] [address]             [phone]      1 Day Post-Op  Procedure(s) (LRB): PACEMAKER IMPLANT (N/A)   Total Length of Stay:  LOS: 7 days    SUBJECTIVE: Feeling better  Tolerated PPM yesterday  Vitals:   02/18/22 0500 02/18/22 0600  BP: 126/73 111/65  Pulse: 89 91  Resp: 14 13  Temp:    SpO2: 100% 100%    Intake/Output      10/23 0701 10/24 0700 10/24 0701 10/25 0700   P.O. 120    I.V. (mL/kg) 11 (0.2)    Blood 0    IV Piggyback 100    Total Intake(mL/kg) 231 (3.9)    Urine (mL/kg/hr) 2100 (1.5)    Stool     Total Output 2100    Net -1869             sodium chloride     sodium chloride     sodium chloride Stopped (02/13/22 0526)   lactated ringers     lactated ringers     lactated ringers Stopped (02/13/22 1006)   promethazine (PHENERGAN) injection (IM or IVPB) Stopped (02/15/22 2228)    CBC    Component Value Date/Time   WBC 9.1 02/18/2022 0410   RBC 2.52 (L) 02/18/2022 0410   HGB 7.4 (L) 02/18/2022 0410   HGB 12.5 12/23/2021 1117   HCT 23.1 (L) 02/18/2022 0410   HCT 37.4 12/23/2021 1117   PLT 253 02/18/2022 0410   PLT 255 12/23/2021 1117   MCV 91.7 02/18/2022 0410   MCV 86 12/23/2021 1117   MCH 29.4 02/18/2022 0410   MCHC 32.0 02/18/2022 0410   RDW 15.0 02/18/2022 0410   RDW 12.6 12/23/2021 1117   LYMPHSABS 1.5 06/26/2020 1124   MONOABS 0.4 06/26/2020 1124   EOSABS 0.1 06/26/2020 1124   BASOSABS 0.1 06/26/2020 1124   CMP     Component Value Date/Time   NA 136 02/18/2022 0410   NA 141 12/23/2021 1117   K 4.2 02/18/2022 0410   CL 99 02/18/2022 0410   CO2 31 02/18/2022 0410   GLUCOSE 134 (H) 02/18/2022 0410   BUN <5 (L) 02/18/2022 0410   BUN 7 (L) 12/23/2021 1117   CREATININE 0.58 02/18/2022 0410   CALCIUM 8.0 (L) 02/18/2022 0410   PROT 5.0 (L) 02/14/2022 0616   ALBUMIN 3.1 (L) 02/14/2022 0616   AST 30 02/14/2022 0616   ALT 14 02/14/2022 0616   ALKPHOS 57 02/14/2022 0616    BILITOT 0.7 02/14/2022 0616   GFRNONAA >60 02/18/2022 0410   GFRAA >60 04/02/2019 2059   ABG    Component Value Date/Time   PHART 7.396 02/12/2022 0501   PCO2ART 36.9 02/12/2022 0501   PO2ART 140 (H) 02/12/2022 0501   HCO3 22.8 02/12/2022 0501   TCO2 24 02/12/2022 0501   ACIDBASEDEF 2.0 02/12/2022 0501   O2SAT 66 02/13/2022 0343   CBG (last 3)  Recent Labs    02/17/22 1547 02/17/22 1807 02/17/22 2104  GLUCAP 190* 175* 130*   EXAM Lungs: decreased Left base Card: tachy Ext: warm and dry   ASSESSMENT: POD #7 SP Redo MVR POD #1 SP PPM Doing well Will DC pacer wires DC foley Transfer to floor Add BB Start eliquis   [name], MD '@DATE'$ @

## 2022-02-18 NOTE — Progress Notes (Signed)
CT Surgery PM Rounds  [facility] pacer working well- has tx orders to floor IV lasix given this pm for wt up 9 kg  Blood pressure (!) 112/51, pulse 82, temperature 98.4 F (36.9 C), temperature source Oral, resp. rate 20, height 5' (1.524 m), weight 59.1 kg, SpO2 100 %.

## 2022-02-18 NOTE — Progress Notes (Signed)
Occupational Therapy Treatment Patient Details Name: Elaine Le MRN: [id number] DOB: 12/12/1960 Today's Date: 02/18/2022   History of present illness Pt is a 61 y.o. female admitted 02/11/22 for same day redo MVR with sternotomy. ETT 10/17-10/18. 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 S/p thoracentesis 10/23. Pacemaker 10/23. 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   OT comments  Pt seen s/p pacemaker placement, handout given and cues given throughout to maintain during mobility and ADLs. Overall she was supervision A for all transfers, mobility, toileting and grooming this date. Pt reports being anxious/nervous about falling, but noted to have steady gait with good safety awareness. After BM and hygiene pt reported some light headedness, pt returned supine with BP 110/37. RN notified. OT to continue to follow acutely. POC remains appropriate.    Recommendations for follow up therapy are one component of a multi-disciplinary discharge planning process, led by the attending physician.  Recommendations may be updated based on patient status, additional functional criteria and insurance authorization.    Follow Up Recommendations  Home health OT    Assistance Recommended at Discharge Frequent or constant Supervision/Assistance  Patient can return home with the following  A little help with walking and/or transfers;A little help with bathing/dressing/bathroom;Help with stairs or ramp for entrance;Assistance with cooking/housework;Assist for transportation   Equipment Recommendations  Tub/shower seat       Precautions / Restrictions Precautions Precautions: Sternal;ICD/Pacemaker Precaution Booklet Issued: Yes (comment) Precaution Comments: pacemaker handout given Restrictions Weight Bearing Restrictions: Yes (Simultaneous filing. User may not have seen previous data.) LUE Weight Bearing: Non weight bearing (Simultaneous filing. User may  not have seen previous data.)       Mobility Bed Mobility Overal bed mobility: Needs Assistance Bed Mobility: Supine to Sit, Sit to Supine     Supine to sit: Min guard Sit to supine: Min assist   General bed mobility comments: cues for NWB LUE    Transfers Overall transfer level: Needs assistance Equipment used: None Transfers: Sit to/from Stand Sit to Stand: Supervision                 Balance Overall balance assessment: Needs assistance         Standing balance support: No upper extremity supported, During functional activity Standing balance-[name] Scale: Fair                             ADL either performed or assessed with clinical judgement   ADL Overall ADL's : Needs assistance/impaired Eating/Feeding: Independent;Sitting   Grooming: Supervision/safety;Cueing for UE precautions;Standing             Upper Body Dressing Details (indicate cue type and reason): verbally reviewed donning shirt with precuations     Toilet Transfer: Supervision/safety;Ambulation Toilet Transfer Details (indicate cue type and reason): no AD Toileting- Clothing Manipulation and Hygiene: Supervision/safety;Sit to/from stand       Functional mobility during ADLs: Supervision/safety General ADL Comments: limited by nausea this date. no physical assist needed. cues for LUE NWB and pacemaker precautions    Extremity/Trunk Assessment Upper Extremity Assessment Upper Extremity Assessment: LUE deficits/detail LUE Deficits / Details: limited due to pacemaker LUE: Unable to fully assess due to pain;Unable to fully assess due to immobilization   Lower Extremity Assessment Lower Extremity Assessment: Defer to PT evaluation        Vision   Vision Assessment?: No apparent visual deficits   Perception  Perception Perception: Not tested   Praxis Praxis Praxis: Not tested    Cognition Arousal/Alertness: Awake/alert Behavior During Therapy: WFL for tasks  assessed/performed Overall Cognitive Status: Within Functional Limits for tasks assessed                     General Comments: slowed processing noted, good insight              General Comments pt had BM, unable to urinate. after BM pt reported some light headedness, BP was 110/37. RN notified. O2 >92% on RA    Pertinent Vitals/ Pain       Pain Assessment Pain Assessment: Faces Faces Pain Scale: Hurts a little bit Pain Location: LUE, pacemaker insertion site Pain Descriptors / Indicators: Discomfort, Sore Pain Intervention(s): Limited activity within patient's tolerance, Monitored during session   Frequency  Min 2X/week        Progress Toward Goals  OT Goals(current goals can now be found in the care plan section)  Progress towards OT goals: Progressing toward goals  Acute Rehab OT Goals Patient Stated Goal: to feel better OT Goal Formulation: With patient Time For Goal Achievement: 02/28/22 Potential to Achieve Goals: Good ADL Goals Pt Will Perform Grooming: with supervision;standing Pt Will Perform Upper Body Bathing: with supervision;sitting Pt Will Perform Lower Body Bathing: with supervision;sit to/from stand Pt Will Perform Upper Body Dressing: with supervision;sitting Pt Will Perform Lower Body Dressing: with supervision;sit to/from stand Pt Will Transfer to Toilet: with supervision;ambulating;regular height toilet Pt Will Perform Toileting - Clothing Manipulation and hygiene: with supervision;sit to/from stand Additional ADL Goal #1: Pt will be S in and OOB for basic ADLs  Plan Discharge plan remains appropriate       AM-PAC OT "6 Clicks" Daily Activity     Outcome Measure   Help from another person eating meals?: None Help from another person taking care of personal grooming?: A Little Help from another person toileting, which includes using toliet, bedpan, or urinal?: A Little Help from another person bathing (including washing, rinsing,  drying)?: A Little Help from another person to put on and taking off regular upper body clothing?: A Little Help from another person to put on and taking off regular lower body clothing?: A Little 6 Click Score: 19    End of Session    OT Visit Diagnosis: Other abnormalities of gait and mobility (R26.89);Muscle weakness (generalized) (M62.81)   Activity Tolerance Patient tolerated treatment well   Patient Left in bed;with call bell/phone within reach   Nurse Communication Mobility status (low BP)        Time: 1429-1510 OT Time Calculation (min): 41 min  Charges: OT General Charges $OT Visit: 1 Visit OT Evaluation $OT Eval Moderate Complexity: 1 Mod OT Treatments $Self Care/Home Management : 23-37 mins    Elliot Cousin 02/18/2022, 4:27 PM

## 2022-02-18 NOTE — Progress Notes (Signed)
NAME:  Elaine Le, MRN:  [id number], DOB:  Jun 15, 1960, LOS: 7 ADMISSION DATE:  02/11/2022, CONSULTATION DATE:  02/11/2022 REFERRING MD:  Yolanda Manges, CHIEF COMPLAINT:  post-operative care.   History of Present Illness:  61 year old woman who presented for elective redo-MVR.   She has rheumatic heart disease and had a bioprosthetic MVR in 2017.  In the last year however (since having COVID) she has developed NYHA II-III dyspnea and lower extremity edema. Denied chest pain.  Work-up revealed severe MR with moderate PAH (sPAP 52)   She has seen Dr Shearon Stalls for COPD. Last seen in 09/2021.  PFT's showed moderate airflow obstruction.  She has OSA uses CPAP  Pertinent  Medical History   Past Medical History:  Diagnosis Date   Acute respiratory failure with hypercapnia ([place]) 08/65/7846   Acute systolic heart failure ([place]) 09/2014   Adenomatous colon polyp    Arthritis    Back pain    Chronic diastolic heart failure ([facility])    related to MR/MS   Chronic pain syndrome    Chronic sinusitis    COPD (chronic obstructive pulmonary disease)-PFT pending  07/17/2014   Diverticulosis    Dyspnea    Fibromyalgia    GERD (gastroesophageal reflux disease)    Headaches, cluster    Heart murmur    Hiatal hernia    Hx of adenomatous colonic polyps 03/10/2011   Oct 2012, repeat colon Oct 2017    Hypothyroidism    Internal hemorrhoids    Irritable bowel syndrome (IBS)    Mitral valve regurgitation    severe   Mitral valve stenosis, moderate 07/11/2014   Pneumonia    Feb. 24, 2016   Protein-calorie malnutrition, severe ([place]) 07/06/2014   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/10/2014   Severe mitral regurgitation with moderate mitral stenosis   S/P minimally invasive mitral valve replacement with bioprosthetic valve 11/08/2014   25 mm [facility] Mitral bovine bioprosthetic tissue valve placed via right mini thoracotomy approach   Sleep apnea    uses CPAP-setting 7   Tobacco abuse     Significant Hospital Events: Including procedures, antibiotic start and stop dates in addition to other pertinent events   10/17  Redo Mitral Valve replacement initially  using a 25 mm Mitris Pericardial valve which needed removal and placement of a 27 mm Mosaic Porcine valve 10/18 extubated; off pressors 10/20 awaiting PPM implantation   Interim History / Subjective:  No events. Feels okay. Pacer placed yesterday.  Objective   Blood pressure 111/65, pulse 91, temperature 98.3 F (36.8 C), temperature source Oral, resp. rate 13, height 5' (1.524 m), weight 59.1 kg, SpO2 100 %.        Intake/Output Summary (Last 24 hours) at 02/18/2022 0722 Last data filed at 02/18/2022 0100 Gross per 24 hour  Intake 230.96 ml  Output 2100 ml  Net -1869.04 ml    Filed Weights   02/15/22 0500 02/16/22 0500 02/18/22 0600  Weight: 60.5 kg 60.4 kg 59.1 kg   Examination: No distress MMM, trachea midline Lungs diminished bases Ext warm Moves all 4 ext to command  BMP, CBC stable  Assessment & Plan:  Complete heart block post MVR- s/p PPM Status post redo MVR via median sternotomy.- pain control and AC per primary  Cardiogenic and distributive shock post redo MVR. Resolved  Mild hypoxemia due to bilateral atelectasis, effusions- post thora x 2 Continue IS, should resolve with mobility  GOLD stage II (moderate) COPD Well controlled on  prn albuterol only   OSA  CPAP at night  Postop acute blood loss anemia, expected Thrombocytopenia resolved Transfusion targets per primary  Hypothyroidism Continue home liothyroxine and levothyroxine.  Stable for floor, available PRN    Erskine Emery, MD [place] See Amion for pager If no response to pager, please call [phone] until 7pm After 7pm, Please call E-link [phone]

## 2022-02-18 NOTE — Progress Notes (Signed)
Rounding Note    Patient Name: Elaine Le Date of Encounter: 02/18/2022  [place] Cardiologist: Minus Breeding, MD   Subjective   Sitting in chair this AM. Nauseated Foley removed yesterday after PM placement, I & O x 1 overnight, does not feel urge to void Wants to shower and wash hair.  Inpatient Medications    Scheduled Meds:  sodium chloride   Intravenous Once   aspirin EC  81 mg Oral Daily   Or   aspirin  81 mg Per Tube Daily   bisacodyl  10 mg Oral Daily   Or   bisacodyl  10 mg Rectal Daily   Chlorhexidine Gluconate Cloth  6 each Topical Daily   docusate sodium  200 mg Oral Daily   fluticasone  2 spray Each Nare QHS   furosemide  40 mg Oral BID   insulin aspart  0-15 Units Subcutaneous TID WC   insulin aspart  0-5 Units Subcutaneous QHS   levothyroxine  75 mcg Oral QAC breakfast   liothyronine  5 mcg Oral Daily   metoprolol tartrate  12.5 mg Oral BID   pantoprazole  40 mg Oral Daily   potassium chloride  20 mEq Oral BID   sodium chloride flush  10-40 mL Intracatheter Q12H   sodium chloride flush  3 mL Intravenous Q12H   Continuous Infusions:  sodium chloride     sodium chloride     sodium chloride Stopped (02/13/22 0526)   lactated ringers     lactated ringers     lactated ringers Stopped (02/13/22 1006)   promethazine (PHENERGAN) injection (IM or IVPB) Stopped (02/15/22 2228)   PRN Meds: sodium chloride, acetaminophen, alum & mag hydroxide-simeth, ipratropium-albuterol **AND** [COMPLETED] ipratropium-albuterol, lactated ringers, ondansetron (ZOFRAN) IV, ondansetron, mouth rinse, promethazine (PHENERGAN) injection (IM or IVPB), sodium chloride flush, sodium chloride flush, traMADol   Vital Signs    Vitals:   02/18/22 0400 02/18/22 0500 02/18/22 0600 02/18/22 0739  BP: 120/69 126/73 111/65   Pulse: 91 89 91   Resp: '14 14 13   '$ Temp:    98.4 F (36.9 C)  TempSrc:    Oral  SpO2: 100% 100% 100%   Weight:   59.1 kg   Height:         Intake/Output Summary (Last 24 hours) at 02/18/2022 0834 Last data filed at 02/18/2022 0100 Gross per 24 hour  Intake 230.96 ml  Output 2100 ml  Net -1869.04 ml      02/18/2022    6:00 AM 02/16/2022    5:00 AM 02/15/2022    5:00 AM  Last 3 Weights  Weight (lbs) 130 lb 6.4 oz 133 lb 2.5 oz 133 lb 6.1 oz  Weight (kg) 59.149 kg 60.4 kg 60.5 kg      Telemetry    AV paced  ECG    10/24 - A sensed, V paced  Physical Exam   GEN: Well nourished, well developed, in no acute distress. HEENT: normal  Neck: no JVD, carotid bruits, or masses Cardiac: RRR; no murmurs, rubs, or gallops,no edema  Respiratory: clear to auscultation bilaterally, much more normal work of breathing GI: soft, nontender, nondistended, + BS MS: no deformity or atrophy  Skin: warm and dry, well-healing median sternotomy scar, scattered eccymosis; L Chest incision with scant, dry blood on steri-strips. Outer bandage removed.  Neuro:  Strength and sensation are intact Psych: euthymic mood, full affect   Labs    High Sensitivity Troponin:  No results for  input(s): "TROPONINIHS" in the last 720 hours.   Chemistry Recent Labs  Lab 02/12/22 1416 02/12/22 1804 02/13/22 0343 02/14/22 0616 02/15/22 0552 02/16/22 0429 02/17/22 0430 02/18/22 0410  NA  --    < > 136 132*   < > 136 137 136  K  --    < > 4.7 4.6   < > 4.6 4.0 4.2  CL  --    < > 105 96*   < > 98 102 99  CO2  --    < > 24 28   < > '29 29 31  '$ GLUCOSE  --    < > 128* 123*   < > 103* 125* 134*  BUN  --    < > 5* 9   < > 7* 6* <5*  CREATININE  --    < > 0.62 0.72   < > 0.65 0.55 0.58  CALCIUM  --    < > 7.7* 8.2*   < > 7.9* 7.8* 8.0*  MG  --    < > 2.2 2.1  --  2.1  --   --   PROT 4.7*  --  4.3* 5.0*  --   --   --   --   ALBUMIN 3.6  --  3.0* 3.1*  --   --   --   --   AST 66*  --  48* 30  --   --   --   --   ALT 17  --  14 14  --   --   --   --   ALKPHOS 34*  --  47 57  --   --   --   --   BILITOT 0.8  --  0.5 0.7  --   --   --   --    GFRNONAA  --    < > >60 >60   < > >60 >60 >60  ANIONGAP  --    < > 7 8   < > '9 6 6   '$ < > = values in this interval not displayed.    Lipids No results for input(s): "CHOL", "TRIG", "HDL", "LABVLDL", "LDLCALC", "CHOLHDL" in the last 168 hours.  Hematology Recent Labs  Lab 02/16/22 0429 02/17/22 0430 02/18/22 0410  WBC 7.8 7.9 9.1  RBC 2.69* 2.61* 2.52*  HGB 7.8* 7.7* 7.4*  HCT 24.5* 24.0* 23.1*  MCV 91.1 92.0 91.7  MCH 29.0 29.5 29.4  MCHC 31.8 32.1 32.0  RDW 14.6 14.7 15.0  PLT 204 241 253   Thyroid  Recent Labs  Lab 02/11/22 2119  TSH 0.835    BNPNo results for input(s): "BNP", "PROBNP" in the last 168 hours.  DDimer No results for input(s): "DDIMER" in the last 168 hours.   Radiology    DG Chest 2 View  Result Date: 02/18/2022 CLINICAL DATA:  Pacemaker insertion EXAM: CHEST - 2 VIEW COMPARISON:  02/17/2022; 02/12/2022 FINDINGS: Grossly unchanged borderline enlarged cardiac silhouette and mediastinal contours post median sternotomy. Interval placement of left anterior chest wall dual lead pacemaker with lead tips projected over the expected location of the right atrium and ventricle. No pneumothorax. Unchanged small layering bilateral effusions with associated bibasilar heterogeneous/consolidative opacities. Redemonstrated pleuroparenchymal thickening about the right minor fissure. Pulmonary venous congestion without frank evidence of edema. No acute osseous abnormalities. Surgical clips again overlie the upper outer quadrant of the left breast. IMPRESSION: 1. Interval placement of left anterior chest wall dual lead pacemaker with  lead tips projected over the right atrium and ventricle. No pneumothorax. 2. Similar findings of cardiomegaly, pulmonary edema, small bilateral effusions and associated bibasilar opacities, likely atelectasis. Electronically Signed   By: Sandi Mariscal M.D.   On: 02/18/2022 08:17   EP PPM/ICD IMPLANT  Result Date: 02/17/2022  CONCLUSIONS:  1.   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  Dual-chamber permanent pacemaker with left bundle area lead  3.  No early apparent complications.  4.  No heparin products. OK to transition to NOAC per cardiothoracic surgery team after 48 hours.   DG Chest Port 1 View  Result Date: 02/17/2022 CLINICAL DATA:  Thoracentesis EXAM: PORTABLE CHEST 1 VIEW COMPARISON:  02/17/2022 FINDINGS: Reduction in RIGHT pleural fluid following thoracentesis. No appreciable pneumothorax. Sternotomy wires overlies stable cardiac silhouette. IMPRESSION: 1. No pneumothorax. 2. Reduction in RIGHT pleural fluid. Electronically Signed   By: Suzy Bouchard M.D.   On: 02/17/2022 12:50   DG CHEST PORT 1 VIEW  Result Date: 02/17/2022 CLINICAL DATA:  Dyspnea. EXAM: PORTABLE CHEST 1 VIEW COMPARISON:  02/12/2022 FINDINGS: 0758 hours. Interval extubation and NG tube removal. Right IJ sheath has been pulled in the interval. Mediastinal/pericardial drains are no longer evident. Slight increase in bibasilar atelectasis with probable tiny effusions. No overt pulmonary edema. Telemetry leads overlie the chest. IMPRESSION: 1. Interval extubation and NG tube removal. 2. Slight increase in bibasilar atelectasis with probable tiny effusions. Electronically Signed   By: Misty Stanley M.D.   On: 02/17/2022 08:21    Cardiac Studies   TTE 12/16/21  1. 25 mm bioprosthesis. MG 14 @ 78 bpm. Emax 2.5 m/s, EOA 1.14 cm2, DVI  2.6, PHT 72 ms. All suggest severe regurgitation. Based on prior TEE, MR  is severe. All parameters point to severe MR to explain elevated gradient.  The mitral valve has been  repaired/replaced. Severe mitral valve regurgitation. There is a 25 mm  Edwards Magna bioprosthetic valve present in the mitral position.  Procedure Date: 11/08/2014.   2. Left ventricular ejection fraction, by estimation, is 65 to 70%. The  left ventricle has normal function. The left ventricle has no regional  wall motion abnormalities.  Left ventricular diastolic function could not  be evaluated.   3. Right ventricular systolic function is normal. The right ventricular  size is normal. Tricuspid regurgitation signal is inadequate for assessing  PA pressure.   4. Left atrial size was mild to moderately dilated.   5. The aortic valve is tricuspid. Aortic valve regurgitation is not  visualized. No aortic stenosis is present.   6. The inferior vena cava is normal in size with greater than 50%  respiratory variability, suggesting right atrial pressure of 3 mmHg.  Patient Profile     61 y.o. female status post mitral valve replacement who has complete heart block post MVR.  Assessment & Plan    #) CHB Post-op MVR TEE states LVEF preserved S/p PM insertion 10/23 No immediate complications post-operatively Prefer to hold anticoagulation for a couple days post-op, no earlier than 10/24 PM Cxr this AM - no pneumothorax Device check this AM with stable measurements Wound care and activity restrictions discussed with patient and entered in discharge instructions Usual outpatient follow-up scheduled  #) MVR Plan per cardiac surgery.  #) Urinary retention Foley removed yesterday I & O x 1 overnight Bladder scan this AM 236m per nursing Defer to primary team   EP will sign off at this time, but remains available. Please re-consulted  if needed    For questions or updates, please contact [place] Please consult [email or web address] for contact info under     Mamie Levers, NP 02/18/22 8:34 AM

## 2022-02-18 NOTE — Progress Notes (Signed)
Physical Therapy Treatment Patient Details Name: Elaine Le MRN: [id number] DOB: Aug 15, 1960 Today's Date: 02/18/2022   History of Present Illness Pt is a 61 y.o. female admitted 02/11/22 for same day redo MVR with sternotomy. ETT 10/17-10/18. 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 S/p thoracentesis 10/23. 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   PT Comments    Pt progressing with mobility. Today's session focused on ambulation for improving strength and activity tolerance; pt moving fairly well without DME, intermittent min guard for balance, though limited by nausea this session (RN aware). Will continue to follow acutely to address established goals.    Recommendations for follow up therapy are one component of a multi-disciplinary discharge planning process, led by the attending physician.  Recommendations may be updated based on patient status, additional functional criteria and insurance authorization.  Follow Up Recommendations  Home health PT     Assistance Recommended at Discharge Intermittent Supervision/Assistance  Patient can return home with the following Assistance with cooking/housework;Assist for transportation;A little help with bathing/dressing/bathroom   Equipment Recommendations   (TBD - likely none)    Recommendations for Other Services       Precautions / Restrictions Precautions Precautions: Sternal;ICD/Pacemaker     Mobility  Bed Mobility Overal bed mobility: Needs Assistance Bed Mobility: Sit to Sidelying, Rolling Rolling: Supervision       Sit to sidelying: Supervision General bed mobility comments: return to supine with bed flat, good ability to maintain sternal precautions without cues, supervision for lines    Transfers Overall transfer level: Needs assistance Equipment used: None Transfers: Sit to/from Stand Sit to Stand: Supervision           General transfer comment: good ability  to maintain sternal precautions without cues, supervision for safety/lines    Ambulation/Gait Ambulation/Gait assistance: Min guard Gait Distance (Feet): 150 Feet (+ 160') Assistive device: None Gait Pattern/deviations: Step-through pattern, Decreased stride length Gait velocity: decreased     General Gait Details: slow, mostly steady gait without DME, min guard for balance; cues for activity pacing, pt requesting standing rest break secondary to c/o nausea, encouraged seated rest break due to nausea and noted pallor; able to walk back to room once symptoms improved   Stairs             Wheelchair Mobility    Modified Rankin (Stroke Patients Only)       Balance Overall balance assessment: Needs assistance   Sitting balance-[name] Scale: Good     Standing balance support: No upper extremity supported, During functional activity Standing balance-[name] Scale: Fair Standing balance comment: can walk without DME, balance not challenged                            Cognition Arousal/Alertness: Awake/alert Behavior During Therapy: WFL for tasks assessed/performed Overall Cognitive Status: Within Functional Limits for tasks assessed                                          Exercises      General Comments General comments (skin integrity, edema, etc.): pt's parents present and supportive. brief episode of HR up to 128 with quick resolved to 60s. BP not tested during walk and pt requesting to lay down once back in room, PICC on RUE and PPM precautions LUE; RN notified  Pertinent Vitals/Pain Pain Assessment Pain Assessment: Faces Faces Pain Scale: Hurts a little bit Pain Location: LUE, pacemaker insertion site Pain Descriptors / Indicators: Discomfort, Sore Pain Intervention(s): Monitored during session, Limited activity within patient's tolerance    Home Living                          Prior Function            PT  Goals (current goals can now be found in the care plan section) Progress towards PT goals: Progressing toward goals    Frequency    Min 3X/week      PT Plan Current plan remains appropriate    Co-evaluation              AM-PAC PT "6 Clicks" Mobility   Outcome Measure  Help needed turning from your back to your side while in a flat bed without using bedrails?: None Help needed moving from lying on your back to sitting on the side of a flat bed without using bedrails?: A Little Help needed moving to and from a bed to a chair (including a wheelchair)?: A Little Help needed standing up from a chair using your arms (e.g., wheelchair or bedside chair)?: A Little Help needed to walk in hospital room?: A Little Help needed climbing 3-5 steps with a railing? : A Little 6 Click Score: 19    End of Session Equipment Utilized During Treatment: Gait belt Activity Tolerance: Patient tolerated treatment well;Other (comment) (limited by nausea) Patient left: in bed;with call bell/phone within reach;with family/visitor present (RN request return to bed for line removal) Nurse Communication: Mobility status PT Visit Diagnosis: Other abnormalities of gait and mobility (R26.89);Difficulty in walking, not elsewhere classified (R26.2)     Time: 4696-2952 PT Time Calculation (min) (ACUTE ONLY): 22 min  Charges:  $Therapeutic Exercise: 8-22 mins                     Mabeline Caras, PT, DPT Acute Rehabilitation Services  Personal: [facility] Office: [place] 02/18/2022, 12:36 PM

## 2022-02-18 NOTE — Progress Notes (Signed)
Pt refused CPAP

## 2022-02-19 ENCOUNTER — Inpatient Hospital Stay (HOSPITAL_COMMUNITY): Payer: Medicare Other

## 2022-02-19 LAB — BASIC METABOLIC PANEL
Anion gap: 13 (ref 5–15)
BUN: 5 mg/dL — ABNORMAL LOW (ref 8–23)
CO2: 29 mmol/L (ref 22–32)
Calcium: 8.5 mg/dL — ABNORMAL LOW (ref 8.9–10.3)
Chloride: 93 mmol/L — ABNORMAL LOW (ref 98–111)
Creatinine, Ser: 0.69 mg/dL (ref 0.44–1.00)
GFR, Estimated: >60 mL/min (ref >60)
Glucose, Bld: 111 mg/dL — ABNORMAL HIGH (ref 70–99)
Potassium: 4 mmol/L (ref 3.5–5.1)
Sodium: 135 mmol/L (ref 135–145)

## 2022-02-19 LAB — CBC
HCT: 24.2 % — ABNORMAL LOW (ref 36.0–46.0)
Hemoglobin: 7.6 g/dL — ABNORMAL LOW (ref 12.0–15.0)
MCH: 28.7 pg (ref 26.0–34.0)
MCHC: 31.4 g/dL (ref 30.0–36.0)
MCV: 91.3 fL (ref 80.0–100.0)
Platelets: 306 10*3/uL (ref 150–400)
RBC: 2.65 MIL/uL — ABNORMAL LOW (ref 3.87–5.11)
RDW: 14.8 % (ref 11.5–15.5)
WBC: 7.6 10*3/uL (ref 4.0–10.5)
nRBC: 0 % (ref 0.0–0.2)

## 2022-02-19 LAB — GLUCOSE, CAPILLARY
Glucose-Capillary: 107 mg/dL — ABNORMAL HIGH (ref 70–99)
Glucose-Capillary: 99 mg/dL (ref 70–99)

## 2022-02-19 MED ORDER — POTASSIUM CHLORIDE CRYS ER 20 MEQ PO TBCR
20.0000 meq | EXTENDED_RELEASE_TABLET | Freq: Two times a day (BID) | ORAL | Status: DC
  Administered 2022-02-19 – 2022-02-21 (×5): 20 meq via ORAL
  Filled 2022-02-19 (×5): qty 1

## 2022-02-19 MED ORDER — CLOTRIMAZOLE 2 % VA CREA
1.0000 | TOPICAL_CREAM | Freq: Every day | VAGINAL | Status: DC
  Administered 2022-02-19 – 2022-02-20 (×2): 1 via VAGINAL
  Filled 2022-02-19: qty 21

## 2022-02-19 NOTE — Progress Notes (Signed)
Pt refused CPAP

## 2022-02-19 NOTE — Plan of Care (Signed)
  Problem: Education: Goal: Will demonstrate proper wound care and an understanding of methods to prevent future damage Outcome: Progressing Goal: Knowledge of disease or condition will improve Outcome: Progressing Goal: Knowledge of the prescribed therapeutic regimen will improve Outcome: Progressing Goal: Individualized Educational Video(s) Outcome: Progressing   Problem: Activity: Goal: Risk for activity intolerance will decrease Outcome: Progressing   Problem: Cardiac: Goal: Will achieve and/or maintain hemodynamic stability Outcome: Progressing   Problem: Clinical Measurements: Goal: Postoperative complications will be avoided or minimized Outcome: Progressing   Problem: Respiratory: Goal: Respiratory status will improve Outcome: Progressing   Problem: Skin Integrity: Goal: Wound healing without signs and symptoms of infection Outcome: Progressing Goal: Risk for impaired skin integrity will decrease Outcome: Progressing   Problem: Urinary Elimination: Goal: Ability to achieve and maintain adequate renal perfusion and functioning will improve Outcome: Progressing   Problem: Education: Goal: Knowledge of General Education information will improve Description: Including pain rating scale, medication(s)/side effects and non-pharmacologic comfort measures Outcome: Progressing   Problem: Health Behavior/Discharge Planning: Goal: Ability to manage health-related needs will improve Outcome: Progressing   Problem: Clinical Measurements: Goal: Ability to maintain clinical measurements within normal limits will improve Outcome: Progressing Goal: Will remain free from infection Outcome: Progressing Goal: Diagnostic test results will improve Outcome: Progressing Goal: Respiratory complications will improve Outcome: Progressing Goal: Cardiovascular complication will be avoided Outcome: Progressing   Problem: Activity: Goal: Risk for activity intolerance will  decrease Outcome: Progressing   Problem: Nutrition: Goal: Adequate nutrition will be maintained Outcome: Progressing   Problem: Coping: Goal: Level of anxiety will decrease Outcome: Progressing   Problem: Elimination: Goal: Will not experience complications related to bowel motility Outcome: Progressing Goal: Will not experience complications related to urinary retention Outcome: Progressing   Problem: Pain Managment: Goal: General experience of comfort will improve Outcome: Progressing   Problem: Safety: Goal: Ability to remain free from injury will improve Outcome: Progressing   Problem: Skin Integrity: Goal: Risk for impaired skin integrity will decrease Outcome: Progressing   Problem: Education: Goal: Ability to describe self-care measures that may prevent or decrease complications (Diabetes Survival Skills Education) will improve Outcome: Progressing Goal: Individualized Educational Video(s) Outcome: Progressing   Problem: Coping: Goal: Ability to adjust to condition or change in health will improve Outcome: Progressing   Problem: Fluid Volume: Goal: Ability to maintain a balanced intake and output will improve Outcome: Progressing   Problem: Health Behavior/Discharge Planning: Goal: Ability to identify and utilize available resources and services will improve Outcome: Progressing Goal: Ability to manage health-related needs will improve Outcome: Progressing   Problem: Metabolic: Goal: Ability to maintain appropriate glucose levels will improve Outcome: Progressing   Problem: Nutritional: Goal: Maintenance of adequate nutrition will improve Outcome: Progressing Goal: Progress toward achieving an optimal weight will improve Outcome: Progressing   Problem: Skin Integrity: Goal: Risk for impaired skin integrity will decrease Outcome: Progressing   Problem: Tissue Perfusion: Goal: Adequacy of tissue perfusion will improve Outcome: Progressing    Problem: Education: Goal: Knowledge of cardiac device and self-care will improve Outcome: Progressing Goal: Ability to safely manage health related needs after discharge will improve Outcome: Progressing Goal: Individualized Educational Video(s) Outcome: Progressing   Problem: Cardiac: Goal: Ability to achieve and maintain adequate cardiopulmonary perfusion will improve Outcome: Progressing

## 2022-02-19 NOTE — Progress Notes (Signed)
Pt sitting in chair comfortably, declined ambulation due to walking this morning. Agreed to walk in afternoon.

## 2022-02-19 NOTE — Progress Notes (Signed)
   02/19/22 2145  Mobility  Activity Ambulated independently in hallway  Range of Motion/Exercises Active;All extremities  Level of Assistance Independent after set-up  Distance Ambulated (ft) 400 ft  Activity Response Tolerated well   Pt ambulated independently in hallway. States its her 3rd walk today. Tolerated well.

## 2022-02-19 NOTE — Progress Notes (Signed)
Physical Therapy Treatment Patient Details Name: Elaine Le MRN: [id number] DOB: 04/27/1961 Today's Date: 02/19/2022   History of Present Illness Pt is a 61 y.o. female admitted 02/11/22 for same day redo MVR with sternotomy. ETT 10/17-10/18. 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 S/p thoracentesis 10/23. Pacemaker 10/23. 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   PT Comments    Pt progressing with mobility. Today's session focused on ambulation for improving strength and activity tolerance; pt also performing seated/standing ADL tasks with good awareness of sternal and pacemaker precautions. Pt remains limited by generalized weakness and decreased activity tolerance. Further education re: activity recommendations, energy conservation strategies. Will continue to follow acutely to address established goals. Pt interested in outpatient cardiac rehab (which she has done in the past); do no feel she requires follow-up HHPT services, pt in agreement.  SpO2 93% on RA, HR 117    Recommendations for follow up therapy are one component of a multi-disciplinary discharge planning process, led by the attending physician.  Recommendations may be updated based on patient status, additional functional criteria and insurance authorization.  Follow Up Recommendations  No PT follow up (Outpatient Cardiac Rehab Phase II)     Assistance Recommended at Discharge Set up Supervision/Assistance  Patient can return home with the following Assistance with cooking/housework;Assist for transportation   Equipment Recommendations  None recommended by PT    Recommendations for Other Services  Mobility Specialist     Precautions / Restrictions Precautions Precautions: Sternal;ICD/Pacemaker Restrictions Weight Bearing Restrictions: No RUE Weight Bearing: Non weight bearing LUE Weight Bearing: Non weight bearing     Mobility  Bed Mobility Overal bed  mobility: Modified Independent Bed Mobility: Supine to Sit     Supine to sit: Modified independent (Device/Increase time), HOB elevated     General bed mobility comments: pt does not have hospital bed but does elevate head significantly with pillows at home - recreated home set-up without rail, mod indep for supine>sit    Transfers Overall transfer level: Independent Equipment used: None Transfers: Sit to/from [employer] transfer comment: independent without DME, good awareness of precautions    Ambulation/Gait Ambulation/Gait assistance: Supervision Gait Distance (Feet): 540 Feet Assistive device: None Gait Pattern/deviations: Step-through pattern, Decreased stride length Gait velocity: decreased     General Gait Details: slow, guarded gait without DME, supervision for safety; improving awareness of activity pacing   Stairs             Wheelchair Mobility    Modified Rankin (Stroke Patients Only)       Balance Overall balance assessment: Needs assistance   Sitting balance-[name] Scale: Good Sitting balance - Comments: indep to don socks sitting EOB   Standing balance support: No upper extremity supported, During functional activity Standing balance-[name] Scale: Good Standing balance comment: indep with standing ADL tasks at sink             High level balance activites: Side stepping, Direction changes, Turns, Sudden stops, Head turns High Level Balance Comments: no overt instability or LOB with observed higher level balance tasks            Cognition Arousal/Alertness: Awake/alert Behavior During Therapy: WFL for tasks assessed/performed Overall Cognitive Status: Within Functional Limits for tasks assessed  Exercises      General Comments General comments (skin integrity, edema, etc.): pt ambulating after completing ADL tasks in room, endorsing increased fatigue  and feeling like she "overdid it" by end of walk. pt able to urinate during session. HR 117, SpO2 93% on RA, pt endorses SOB post-walk, no DOE noted      Pertinent Vitals/Pain Pain Assessment Pain Assessment: Faces Faces Pain Scale: Hurts a little bit Pain Location: LUE, pacemaker insertion site Pain Descriptors / Indicators: Discomfort, Sore Pain Intervention(s): Monitored during session    Home Living                          Prior Function            PT Goals (current goals can now be found in the care plan section) Progress towards PT goals: Progressing toward goals    Frequency    Min 2X/week      PT Plan Frequency needs to be updated;Discharge plan needs to be updated    Co-evaluation              AM-PAC PT "6 Clicks" Mobility   Outcome Measure  Help needed turning from your back to your side while in a flat bed without using bedrails?: None Help needed moving from lying on your back to sitting on the side of a flat bed without using bedrails?: None Help needed moving to and from a bed to a chair (including a wheelchair)?: None Help needed standing up from a chair using your arms (e.g., wheelchair or bedside chair)?: None Help needed to walk in hospital room?: A Little Help needed climbing 3-5 steps with a railing? : A Little 6 Click Score: 22    End of Session Equipment Utilized During Treatment: Gait belt Activity Tolerance: Patient tolerated treatment well Patient left: in chair;with call bell/phone within reach   PT Visit Diagnosis: Other abnormalities of gait and mobility (R26.89);Difficulty in walking, not elsewhere classified (R26.2)     Time: 1749-4496 PT Time Calculation (min) (ACUTE ONLY): 28 min  Charges:  $Therapeutic Exercise: 8-22 mins $Therapeutic Activity: 8-22 mins                      Mabeline Caras, PT, DPT Acute Rehabilitation Services  Personal: [facility] Office: [place] 02/19/2022, 11:05 AM

## 2022-02-19 NOTE — Progress Notes (Signed)
CARDIAC REHAB PHASE I   PRE:  Rate/Rhythm: 91 SR paced  BP:  Sitting: 97/55      SaO2: 93 RA  MODE:  Ambulation: 280 ft   POST:  Rate/Rhythm: 98 SR paced  BP:  Sitting: 82/50      SaO2: 92 RA  Pt ambulated with standby assistance in the hallway. Pt c/p of feeling "hot" and having tightness in legs. Pt was returned to room with all needs met prior to leaving.   Christen Bame  1:29 PM 02/19/2022

## 2022-02-19 NOTE — Progress Notes (Addendum)
      [address]       [place] [address]              [phone]    7 Days Post-Op REDO MITRAL VALVE REPLACEMENT (MVR) VIA STERNOTOMY USING 27 MM MOSAIC PORCINE MITRAL VALVE (N/A) TRANSESOPHAGEAL ECHOCARDIOGRAM (TEE) (N/A)   2 Days Post-Op   PERMANENT PACEMAKER IMPLANT   Subjective:  Walking around in her room this morning. Overall feels she is progressing and has no new concerns.   Currently on RA without dyspnea.  Objective: Vital signs in last 24 hours: Temp:  [96.8 F (36 C)-100.2 F (37.9 C)] 98.6 F (37 C) (10/25 0700) Pulse Rate:  [59-99] 98 (10/25 0948) Cardiac Rhythm: Normal sinus rhythm (10/25 0701) Resp:  [13-22] 20 (10/25 0700) BP: (96-141)/(36-124) 102/60 (10/25 0948) SpO2:  [88 %-100 %] 100 % (10/25 0700) Weight:  [58.2 kg] 58.2 kg (10/25 0430)     Intake/Output from previous day: 10/24 0701 - 10/25 0700 In: 1340 [P.O.:1340] Out: 2900 [Urine:2900] Intake/Output this shift: Total I/O In: -  Out: 400 [Urine:400]  General appearance: alert, cooperative, and no distress Neurologic: intact Heart: paced rhythm Lungs: no dyspnea on RA. CXR showing improved aeration, small stable bilateral pleural effusions. Extremities: 2+ upper and lower extremity edema.  Wound: Expected bruising along the sternotomy incision and at IV sites.   Lab Results: Recent Labs    02/18/22 0410 02/19/22 0505  WBC 9.1 7.6  HGB 7.4* 7.6*  HCT 23.1* 24.2*  PLT 253 306   BMET:  Recent Labs    02/18/22 0410 02/19/22 0505  NA 136 135  K 4.2 4.0  CL 99 93*  CO2 31 29  GLUCOSE 134* 111*  BUN <5* 5*  CREATININE 0.58 0.69  CALCIUM 8.0* 8.5*    PT/INR: No results for input(s): "LABPROT", "INR" in the last 72 hours. 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  Stable VS and she is progressing with mobility. Continue ASA and low-dose metoprolol.    -Post-op complete heart block- POD1 PPM with appropriate capture.   -RENAL- normal renal function. Has significant volume excess- Wt still about 8kg+. Good response to Lasix. Continue at '40mg'$  BID, supplement K+.   HEME--Expected acute blood loss anemia-Tolerating well, Hct trending up. Monitor.   -Endo- h/o hypothyroidism, no h/o DM. Levothyroxine resumed, Will d/c CBG's and SSI.   -PULM- oxygenating well on RA despite volume overload. CXR improving. Small effusions should resolve with diuresis.   -Disposition- progressing well but will need further diuresis before discharge.  No therapy or equipment needs identified on PT eval earlier today.    LOS: 8 days    Antony Odea, [place] [phone] 02/19/2022  Agree with above She appears close to being discharged with more diuresis today and ambulation will look to see if can go home tomorrow

## 2022-02-19 NOTE — Care Management Important Message (Signed)
Important Message  Patient Details  Name: Elaine Le MRN: [id number] Date of Birth: 01-Dec-1960   Medicare Important Message Given:  Yes     Shelda Altes 02/19/2022, 7:51 AM

## 2022-02-20 LAB — ECHO INTRAOPERATIVE TEE
AV Mean grad: 4 mmHg
AV Peak grad: 8.1 mmHg
Ao pk vel: 1.42 m/s
Height: 60 in
MV M vel: 4.53 m/s
MV Peak grad: 82.1 mmHg
MV Vena cont: 0.3 cm
Weight: 1792 oz

## 2022-02-20 LAB — CBC
HCT: 25.8 % — ABNORMAL LOW (ref 36.0–46.0)
Hemoglobin: 8.3 g/dL — ABNORMAL LOW (ref 12.0–15.0)
MCH: 28.9 pg (ref 26.0–34.0)
MCHC: 32.2 g/dL (ref 30.0–36.0)
MCV: 89.9 fL (ref 80.0–100.0)
Platelets: 312 10*3/uL (ref 150–400)
RBC: 2.87 MIL/uL — ABNORMAL LOW (ref 3.87–5.11)
RDW: 14.8 % (ref 11.5–15.5)
WBC: 7.4 10*3/uL (ref 4.0–10.5)
nRBC: 0 % (ref 0.0–0.2)

## 2022-02-20 LAB — BASIC METABOLIC PANEL
Anion gap: 10 (ref 5–15)
BUN: 5 mg/dL — ABNORMAL LOW (ref 8–23)
CO2: 30 mmol/L (ref 22–32)
Calcium: 8.4 mg/dL — ABNORMAL LOW (ref 8.9–10.3)
Chloride: 96 mmol/L — ABNORMAL LOW (ref 98–111)
Creatinine, Ser: 0.67 mg/dL (ref 0.44–1.00)
GFR, Estimated: >60 mL/min (ref >60)
Glucose, Bld: 127 mg/dL — ABNORMAL HIGH (ref 70–99)
Potassium: 4.2 mmol/L (ref 3.5–5.1)
Sodium: 136 mmol/L (ref 135–145)

## 2022-02-20 MED ORDER — FUROSEMIDE 10 MG/ML IJ SOLN
40.0000 mg | Freq: Once | INTRAMUSCULAR | Status: AC
  Administered 2022-02-20: 40 mg via INTRAVENOUS
  Filled 2022-02-20: qty 4

## 2022-02-20 MED ORDER — LACTULOSE 10 GM/15ML PO SOLN: 20.0000 g | Freq: Once | ORAL | Status: DC

## 2022-02-20 MED ORDER — METOLAZONE 5 MG PO TABS
5.0000 mg | ORAL_TABLET | Freq: Every day | ORAL | Status: DC
  Administered 2022-02-20: 5 mg via ORAL
  Filled 2022-02-20: qty 1

## 2022-02-20 NOTE — Progress Notes (Signed)
Pt refused CPAP qhs.  Pt encouraged to contact RT should she change her mind.  

## 2022-02-20 NOTE — Progress Notes (Addendum)
CARDIAC REHAB PHASE I   PRE:  Rate/Rhythm: 89 SR paced  BP:  Supine: 102/45   SaO2: 99 2L  MODE:  Ambulation: 200 ft   POST:  Rate/Rhythm: 100 SR paced  BP:  Sitting: 116/47   SaO2: 96 2L  Pt received in bed, agreeable to ambulation and education. Pt exited bed to standing following sternal precautions well, ambulated in hallway independently with standby assist. Pt c/o slight dizziness throughout walk in hallway stating she felt "unsteady" at times, resolved when returning to room. Pt returned to bed, educated on sternal precautions, continued IS use, exercise guidelines, restrictions, nutrition, and orientation to CRP2 referral to [facility]. Pt eager for CRP2. All questions answered, pt left in bed with call bell in reach.  [place], MS 02/20/2022 12:30 PM

## 2022-02-20 NOTE — Progress Notes (Signed)
Mobility Specialist Progress Note:   02/20/22 1040  Mobility  Activity Ambulated with assistance in hallway  Level of Assistance Modified independent, requires aide device or extra time  Assistive Device None  Distance Ambulated (ft) 200 ft  Activity Response Tolerated well  $Mobility charge 1 Mobility   Pt received standing in room willing to participate in mobility. No complaints of pain. Left in bed with call bell in reach and all needs met.   [facility] Surveyor, mining Chat only

## 2022-02-20 NOTE — Progress Notes (Addendum)
Patient found on floor by nursing students. Patient stated she sat on foot rest of chair and it folded out from under her, making her land on her bottom. Assessed patient, no injuries found. PA aware. Patient placed on fall risk precautions. VSS. Safety zone reported.  Daymon Larsen, RN

## 2022-02-20 NOTE — Progress Notes (Signed)
      [address]       [place] [address]             [phone]      3 Days Post-Op Procedure(s) (LRB): PACEMAKER IMPLANT (N/A)  Subjective:  Patient has shortness of breath at night.  She has however been refusing CPAP here.  She uses at home.  She states oxygen relieves her symptoms in the hospital when its in use.  The patient complains of constipation and is requesting laxative.  Objective: Vital signs in last 24 hours: Temp:  [98.3 F (36.8 C)-100.3 F (37.9 C)] 98.3 F (36.8 C) (10/26 0745) Pulse Rate:  [60-98] 60 (10/26 0745) Cardiac Rhythm: Ventricular paced (10/25 2200) Resp:  [14-20] 17 (10/26 0745) BP: (95-108)/(43-60) 98/50 (10/26 0745) SpO2:  [91 %-100 %] 100 % (10/26 0745) Weight:  [57.4 kg] 57.4 kg (10/26 0435)  Intake/Output from previous day: 10/25 0701 - 10/26 0700 In: 480 [P.O.:480] Out: 4800 [Urine:4800]  General appearance: alert, cooperative, and no distress Heart: regular rate and rhythm Lungs: clear to auscultation bilaterally Abdomen: soft, non-tender; bowel sounds normal; no masses,  no organomegaly Extremities: edema trace, RUE ecchymosis/edematous Wound: clean and dry  Lab Results: Recent Labs    02/19/22 0505 02/20/22 0630  WBC 7.6 7.4  HGB 7.6* 8.3*  HCT 24.2* 25.8*  PLT 306 312   BMET:  Recent Labs    02/19/22 0505 02/20/22 0630  NA 135 136  K 4.0 4.2  CL 93* 96*  CO2 29 30  GLUCOSE 111* 127*  BUN 5* 5*  CREATININE 0.69 0.67  CALCIUM 8.5* 8.4*    PT/INR: No results for input(s): "LABPROT", "INR" in the last 72 hours. ABG    Component Value Date/Time   PHART 7.396 02/12/2022 0501   HCO3 22.8 02/12/2022 0501   TCO2 24 02/12/2022 0501   ACIDBASEDEF 2.0 02/12/2022 0501   O2SAT 66 02/13/2022 0343   CBG (last 3)  Recent Labs    02/18/22 2114 02/19/22 0621 02/19/22 2118  GLUCAP 105* 107* 99    Assessment/Plan: S/P Procedure(s) (LRB): PACEMAKER IMPLANT (N/A)  CV- paced rhythm, BP labile-  tolerating low dose Lopressor 12.5 mg BID.Marland Kitchen PPM in place Pulm- nightly dyspnea, however refusing CPAP which she uses at home, continue nightly oxygen at home.. patient states she wears her CPAP at home and will continue to do this at discharge Renal- remains volume overloaded, creatinine is stable, will give Lasix, potassium today LUE swelling- I suspect due to IV sites, PPM placement, mostly in lower portion of arm.. not erythematous or painful at this time, will monitor if worsens will get duplex study Deconditioning- mild, continue ambulation Dispo- patient stable, aggressive diuresis today, monitor LUE, if remains clinically stable for possible d/c in AM   LOS: 9 days    Ellwood Handler, PA-C 02/20/2022

## 2022-02-21 LAB — TYPE AND SCREEN
ABO/RH(D): A NEG
Antibody Screen: NEGATIVE
Unit division: 0

## 2022-02-21 LAB — BASIC METABOLIC PANEL
Anion gap: 9 (ref 5–15)
BUN: 6 mg/dL — ABNORMAL LOW (ref 8–23)
CO2: 32 mmol/L (ref 22–32)
Calcium: 8.7 mg/dL — ABNORMAL LOW (ref 8.9–10.3)
Chloride: 94 mmol/L — ABNORMAL LOW (ref 98–111)
Creatinine, Ser: 0.65 mg/dL (ref 0.44–1.00)
GFR, Estimated: >60 mL/min (ref >60)
Glucose, Bld: 130 mg/dL — ABNORMAL HIGH (ref 70–99)
Potassium: 3.6 mmol/L (ref 3.5–5.1)
Sodium: 135 mmol/L (ref 135–145)

## 2022-02-21 LAB — BPAM RBC
Blood Product Expiration Date: 202310292359
ISSUE DATE / TIME: 202310231147
Unit Type and Rh: 600

## 2022-02-21 MED ORDER — METOPROLOL TARTRATE 25 MG PO TABS: 12.5000 mg | ORAL_TABLET | Freq: Two times a day (BID) | ORAL | 3 refills | Status: DC

## 2022-02-21 MED ORDER — ACETAMINOPHEN 325 MG PO TABS: 325.0000 mg | ORAL_TABLET | ORAL | Status: DC | PRN

## 2022-02-21 MED ORDER — FUROSEMIDE 40 MG PO TABS: 40.0000 mg | ORAL_TABLET | Freq: Every day | ORAL | 0 refills | Status: DC

## 2022-02-21 MED ORDER — POTASSIUM CHLORIDE CRYS ER 20 MEQ PO TBCR: 20.0000 meq | EXTENDED_RELEASE_TABLET | Freq: Every day | ORAL | 3 refills | Status: DC

## 2022-02-21 MED ORDER — TRAMADOL HCL 50 MG PO TABS: 50.0000 mg | ORAL_TABLET | Freq: Four times a day (QID) | ORAL | 0 refills | Status: DC | PRN

## 2022-02-21 MED ORDER — ASPIRIN 81 MG PO TBEC: 81.0000 mg | DELAYED_RELEASE_TABLET | Freq: Every day | ORAL | 12 refills | Status: DC

## 2022-02-21 MED ORDER — APIXABAN 5 MG PO TABS: 5.0000 mg | ORAL_TABLET | Freq: Two times a day (BID) | ORAL | 3 refills | Status: DC

## 2022-02-21 NOTE — Plan of Care (Signed)
  Problem: Education: Goal: Will demonstrate proper wound care and an understanding of methods to prevent future damage Outcome: Progressing   Problem: Education: Goal: Knowledge of disease or condition will improve Outcome: Progressing   Problem: Education: Goal: Knowledge of the prescribed therapeutic regimen will improve Outcome: Progressing   Problem: Education: Goal: Individualized Educational Video(s) Outcome: Progressing   Problem: Activity: Goal: Risk for activity intolerance will decrease Outcome: Progressing   Problem: Cardiac: Goal: Will achieve and/or maintain hemodynamic stability Outcome: Progressing

## 2022-02-21 NOTE — Progress Notes (Addendum)
      [address]       [place] [address]             [phone]      4 Days Post-Op Procedure(s) (LRB): PACEMAKER IMPLANT (N/A)  Subjective:  Patient no new complaints.  She is feeling much better.  She is hoping to go home today.  Objective: Vital signs in last 24 hours: Temp:  [97.8 F (36.6 C)-99.1 F (37.3 C)] 98.6 F (37 C) (10/27 0351) Pulse Rate:  [84-107] 84 (10/27 0351) Cardiac Rhythm: Ventricular paced (10/26 1900) Resp:  [17-24] 19 (10/27 0351) BP: (99-124)/(42-56) 112/53 (10/27 0351) SpO2:  [95 %-100 %] 100 % (10/27 0351) Weight:  [54.4 kg] 54.4 kg (10/27 0351)  Intake/Output from previous day: 10/26 0701 - 10/27 0700 In: -  Out: 3200 [Urine:3200]  General appearance: alert, cooperative, and no distress Heart: regular rate and rhythm and paced Lungs: clear to auscultation bilaterally Abdomen: soft, non-tender; bowel sounds normal; no masses,  no organomegaly Extremities: edema trace, LUE improving, ecchymosis persist Wound: clean and dry  Lab Results: Recent Labs    02/19/22 0505 02/20/22 0630  WBC 7.6 7.4  HGB 7.6* 8.3*  HCT 24.2* 25.8*  PLT 306 312   BMET:  Recent Labs    02/20/22 0630 02/21/22 0434  NA 136 135  K 4.2 3.6  CL 96* 94*  CO2 30 32  GLUCOSE 127* 130*  BUN 5* 6*  CREATININE 0.67 0.65  CALCIUM 8.4* 8.7*    PT/INR: No results for input(s): "LABPROT", "INR" in the last 72 hours. ABG    Component Value Date/Time   PHART 7.396 02/12/2022 0501   HCO3 22.8 02/12/2022 0501   TCO2 24 02/12/2022 0501   ACIDBASEDEF 2.0 02/12/2022 0501   O2SAT 66 02/13/2022 0343   CBG (last 3)  Recent Labs    02/18/22 2114 02/19/22 0621 02/19/22 2118  GLUCAP 105* 107* 99    Assessment/Plan: S/P Procedure(s) (LRB): PACEMAKER IMPLANT (N/A)  CV- paced rhythm, BP stable- continue Lopressor 12.5 mg BID Pulm- no acute issues, off oxygen, during the day , continue CPAP Renal- creatinine stable, weight is trending  down, will give Lasix x 7 days at discharge LUE swelling- improving, ecchymosis persist Dispo- patient stable, will d/c home today   LOS: 10 days    Ellwood Handler, PA-C 02/21/2022

## 2022-02-21 NOTE — TOC Transition Note (Addendum)
Transition of Care (TOC) - CM/SW Discharge Note Marvetta Gibbons RN, BSN Transitions of Care Unit 4E- RN Case Manager See Treatment Team for direct phone #    Patient Details  Name: Elaine Le MRN: [id number] Date of Birth: 30-Jun-1960  Transition of Care [facility]) CM/SW Contact:  Dawayne Patricia, RN Phone Number: 02/21/2022, 11:12 AM   Clinical Narrative:    Pt stable for transition home today, Order for DME- RW placed.  CM spoke with pt at bedside to confirm if she needed RW- per pt she declined RW- states she does not need one.  Pt also has TCTS office referral to [facility] for [facility] needs- discussed this with pt who confirmed she has spoken with [facility]. Pt is not sure she wants/needs [facility] and states she may wait to start outpt cardiac rehab. Discussed with pt HH can help bridge her until she is ready and cleared by MD to begin outpt Cardiac Rehab. Pt voiced she will think about [facility] will reach out to pt post discharge and f/u on [facility].   CM notified Simeon Craft JOACZYS of discharge home today.  Family at bedside to transport home .   No further TOC needs noted.    Final next level of care: [place] Barriers to Discharge: Barriers Resolved   Patient Goals and CMS Choice Patient states their goals for this hospitalization and ongoing recovery are:: to return home CMS Medicare.gov Compare Post Acute Care list provided to::  (office protocol referral) Choice offered to / list presented to : NA  Discharge Placement               Home w/ [facility]        Discharge Plan and Services In-house Referral: NA Discharge Planning Services: CM Consult Post Acute Care Choice: Home Health, Durable Medical Equipment          DME Arranged: Patient refused services, [place] rolling DME Agency: NA       HH Arranged: RN, PT, OT HH Agency: [place] Date [place]: 02/21/22 Time HH Agency Contacted: 1000 Representative spoke with at [place]:  [employer] ([place]) Interventions Housing Interventions: Intervention Not Indicated   Readmission Risk Interventions    02/21/2022   11:12 AM  Readmission Risk Prevention Plan  Post Dischage Appt Complete  Medication Screening Complete  Transportation Screening Complete

## 2022-02-21 NOTE — Progress Notes (Signed)
Suture removal attempted per MD order.  One suture removed successfully, the 2nd suture was inaccessible.  PA notified and came to room and removed 2nd suture.  Mild bleeding at site.  Site cleaned and covered with gauze and tape.

## 2022-02-21 NOTE — Care Management Important Message (Signed)
Important Message  Patient Details  Name: Elaine Le MRN: [id number] Date of Birth: [birth year]   Medicare Important Message Given:  Yes     Shelda Altes 02/21/2022, 9:08 AM

## 2022-02-21 NOTE — Progress Notes (Signed)
CARDIAC REHAB PHASE I   PRE:  Rate/Rhythm: 94 paced  BP:  EOB: [birth year]48     SaO2: 99RA  MODE:  Ambulation: 470 ft   POST:  Rate/Rhythm: 98 paced  BP:  EOB: 93/46 (asymptomatic)      SaO2: 100RA  Pt tolerated exercise well and AMB 470 ft with no assistive device, and standby assist. Pt had no rest break, chest pain, SOB or pain. Pt left in the chair with call bell in reach and family in the room. Reviewed IS use and sternal precautions. Also talked about symptoms like lightheadedness with position changes since BP is on the low side. All questions were answered and pt verbalized understanding.  [id number] Elaine Le ACSM-CEP 02/21/2022 9:54 AM

## 2022-02-21 NOTE — Progress Notes (Signed)
Occupational Therapy Treatment Patient Details Name: Elaine Le MRN: [id number] DOB: [birth year] Today's Date: 02/21/2022   History of present illness Pt is a 61 y.o. female admitted 02/11/22 for same day redo MVR with sternotomy. ETT 10/17-10/18. 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 S/p thoracentesis 10/23. Pacemaker 10/23. 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   OT comments  Patient continues to make steady progress towards goals in skilled OT session. Patient's session encompassed education with regard to sternal precautions and energy conservation in promotion of safe discharge home. Patient with good recall throughout session of sternal precaution with good demonstration, and excellent anticipatory awareness when attempting higher level tasks when at home. All questions answered at end of session; OT recommendation remains appropriate.    Recommendations for follow up therapy are one component of a multi-disciplinary discharge planning process, led by the attending physician.  Recommendations may be updated based on patient status, additional functional criteria and insurance authorization.    Follow Up Recommendations  Home health OT    Assistance Recommended at Discharge Frequent or constant Supervision/Assistance  Patient can return home with the following  A little help with bathing/dressing/bathroom;Help with stairs or ramp for entrance;Assistance with cooking/housework;Assist for transportation   Equipment Recommendations  Tub/shower seat    Recommendations for Other Services      Precautions / Restrictions Precautions Precautions: Sternal;ICD/Pacemaker Precaution Booklet Issued: No Restrictions Weight Bearing Restrictions: Yes (sternal) Other Position/Activity Restrictions: sternal precautions       Mobility Bed Mobility Overal bed mobility: Modified Independent             General bed mobility comments:  sitting up at EOB upon arrival    Transfers                         Balance                                           ADL either performed or assessed with clinical judgement   ADL Overall ADL's : Needs assistance/impaired                                       General ADL Comments: session focus on energy conservation, sternal precautions, and safety for discharge home    Extremity/Trunk Assessment              Vision       Perception     Praxis      Cognition Arousal/Alertness: Awake/alert Behavior During Therapy: WFL for tasks assessed/performed Overall Cognitive Status: Within Functional Limits for tasks assessed                                 General Comments: excellent aniticpatory awarness in preparation for discharge home        Exercises      Shoulder Instructions       General Comments      Pertinent Vitals/ Pain       Pain Assessment Pain Assessment: No/denies pain  Home Living  Prior Functioning/Environment              Frequency  Min 2X/week        Progress Toward Goals  OT Goals(current goals can now be found in the care plan section)  Progress towards OT goals: Progressing toward goals  Acute Rehab OT Goals Patient Stated Goal: to go back home OT Goal Formulation: With patient Time For Goal Achievement: 02/28/22 Potential to Achieve Goals: Good  Plan Discharge plan remains appropriate    Co-evaluation                 AM-PAC OT "6 Clicks" Daily Activity     Outcome Measure   Help from another person eating meals?: None Help from another person taking care of personal grooming?: A Little Help from another person toileting, which includes using toliet, bedpan, or urinal?: A Little Help from another person bathing (including washing, rinsing, drying)?: A Little Help from another person to put  on and taking off regular upper body clothing?: A Little Help from another person to put on and taking off regular lower body clothing?: A Little 6 Click Score: 19    End of Session    OT Visit Diagnosis: Other abnormalities of gait and mobility (R26.89);Muscle weakness (generalized) (M62.81)   Activity Tolerance Patient tolerated treatment well   Patient Left in bed;with call bell/phone within reach   Nurse Communication Mobility status        Time: 2229-7989 OT Time Calculation (min): 24 min  Charges: OT General Charges $OT Visit: 1 Visit OT Treatments $Self Care/Home Management : 23-37 mins  [place]. Kellen Dutch, OTR/L Acute Rehabilitation Services [phone]   Ascencion Dike 02/21/2022, 9:40 AM

## 2022-02-21 NOTE — Progress Notes (Signed)
Nurse contacted me as unable to remove middle chest tube suture. Upon examination, silk suture extremely tight to skin. Betadine used to cleanse wound and using pick ups and an 11 blade, suture removed. There was minor bleeding afterward. Patient instructed to use dry 2x2s and tape;change daily. Once bleeding stops, may let open to air. She was instructed to contact the office for any problem with wound.

## 2022-02-24 ENCOUNTER — Telehealth: Payer: Self-pay

## 2022-02-24 NOTE — Telephone Encounter (Signed)
Patient contacted the office to state that she is having some swelling in both legs but a little more in the left ankle. She is s/p Redo MVR with Dr. Lavonna Monarch 10/17 and was discharged from the hospital 10/27. She states that she is keeping her legs elevated when seated and is also taking her Lasix 40 mg daily. She is also weighing herself daily and has decreased in weight since she was discharged from the hospital. 119lbs at discharge, 115lbs yesterday(10/29), and 113lbs today (10/30). She states that she does not have any compression stockings but was advised to get them. She also states that she has not been more short of breath than usual and she is keeping active. She also states that a home health RN is coming to the home today and would evaluate her. Advised to have [place] contact the office with concerns after evaluation.   Lisbeth with [facility] contacted the office this afternoon to state that patient does not want her personal information entered into the home health database and dose not wish that her PCP know about her recent health bouts. [place] states that this is a liability issue and she does not feel like HH can continue to see the patient. Patient is agreeable to not having home health. She also did evaluate patient and states that she does have mild swelling in her left ankle, but states patient advised that the swelling has "improved" since she contacted the office this morning.   Advised to continue to keep the office updated with questions or concerns.

## 2022-02-28 ENCOUNTER — Ambulatory Visit: Payer: Medicare Other

## 2022-02-28 ENCOUNTER — Other Ambulatory Visit: Payer: Self-pay | Admitting: Thoracic Surgery (Cardiothoracic Vascular Surgery)

## 2022-02-28 ENCOUNTER — Ambulatory Visit (INDEPENDENT_AMBULATORY_CARE_PROVIDER_SITE_OTHER): Payer: Medicare Other | Admitting: Physician Assistant

## 2022-02-28 ENCOUNTER — Encounter: Payer: Self-pay | Admitting: Physician Assistant

## 2022-02-28 VITALS — BP 120/60 | HR 91 | Ht 65.0 in | Wt 114.8 lb

## 2022-02-28 DIAGNOSIS — Z950 Presence of cardiac pacemaker: Secondary | ICD-10-CM

## 2022-02-28 DIAGNOSIS — I4892 Unspecified atrial flutter: Secondary | ICD-10-CM | POA: Diagnosis not present

## 2022-02-28 DIAGNOSIS — Z952 Presence of prosthetic heart valve: Secondary | ICD-10-CM | POA: Diagnosis not present

## 2022-02-28 DIAGNOSIS — Z9889 Other specified postprocedural states: Secondary | ICD-10-CM

## 2022-02-28 DIAGNOSIS — Z5189 Encounter for other specified aftercare: Secondary | ICD-10-CM | POA: Diagnosis not present

## 2022-02-28 LAB — CUP PACEART INCLINIC DEVICE CHECK
Date Time Interrogation Session: 20231103172119
Implantable Lead Connection Status: 753985
Implantable Lead Connection Status: 753985
Implantable Lead Implant Date: 20231023
Implantable Lead Implant Date: 20231023
Implantable Lead Location: 753859
Implantable Lead Location: 753860
Implantable Lead Model: 377
Implantable Lead Model: 377
Implantable Lead Serial Number: 8001105850
Implantable Lead Serial Number: 8001122371
Implantable Pulse Generator Implant Date: 20231023
Lead Channel Pacing Threshold Amplitude: 0.6 V
Lead Channel Pacing Threshold Amplitude: 1.4 V
Lead Channel Pacing Threshold Pulse Width: 0.4 ms
Lead Channel Pacing Threshold Pulse Width: 0.4 ms
Lead Channel Sensing Intrinsic Amplitude: 7.5 mV
Pulse Gen Model: 407145
Pulse Gen Serial Number: 1000084874

## 2022-02-28 NOTE — Progress Notes (Unsigned)
      [address]       ,[place] [address]             [phone]      HPI: Patient returns for routine postoperative follow-up having undergone Sternotomy with Redo Mitral Valve Replacement on 02/11/2022. The patient's early postoperative recovery while in the hospital was notable for development of loss of conduction requiring placement of PPM on 02/17/2022.  She had issues with LE edema which responded to diuretics.  She was also noted to have some RUE swelling. Since hospital discharge the patient reports ***.   Current Outpatient Medications  Medication Sig Dispense Refill   acetaminophen (TYLENOL) 325 MG tablet Take 1-2 tablets (325-650 mg total) by mouth every 4 (four) hours as needed for mild pain.     albuterol (VENTOLIN HFA) 108 (90 Base) MCG/ACT inhaler Inhale 2 puffs into the lungs every 6 (six) hours as needed. 18 g 5   apixaban (ELIQUIS) 5 MG TABS tablet Take 1 tablet (5 mg total) by mouth 2 (two) times daily. 60 tablet 3   aspirin EC 81 MG tablet Take 1 tablet (81 mg total) by mouth daily. Swallow whole. 30 tablet 12   clobetasol (TEMOVATE) 0.05 % external solution Apply 1 application  topically daily as needed (scalp irritation.).     estradiol (ESTRACE) 0.1 MG/GM vaginal cream 1 gram vaginally twice weekly (Patient taking differently: Place 1 Applicatorful vaginally daily as needed (vaginal swelling).) 42.5 g 6   fluticasone (FLONASE) 50 MCG/ACT nasal spray Place 2 sprays into both nostrils at bedtime.     furosemide (LASIX) 20 MG tablet Take 1 tablet (20 mg total) by mouth daily. (Patient taking differently: Take 20 mg by mouth daily as needed for edema.) 90 tablet 3   furosemide (LASIX) 40 MG tablet Take 1 tablet (40 mg total) by mouth daily. 7 tablet 0   hydrocortisone cream 1 % Apply 1 Application topically as needed for itching.     ketoconazole (NIZORAL) 2 % shampoo Apply 1 Application topically daily as needed (scalp irritation.).     liothyronine  (CYTOMEL) 5 MCG tablet Take 5 mcg by mouth daily.   2   metoprolol tartrate (LOPRESSOR) 25 MG tablet Take 0.5 tablets (12.5 mg total) by mouth 2 (two) times daily. 30 tablet 3   metroNIDAZOLE (METROCREAM) 0.75 % cream Apply 1 application  topically in the morning. Face     potassium chloride SA (KLOR-CON M) 20 MEQ tablet Take 1 tablet (20 mEq total) by mouth daily. 30 tablet 3   SYNTHROID 75 MCG tablet Take 75 mcg by mouth daily before breakfast.      terconazole (TERAZOL 3) 0.8 % vaginal cream Place 1 applicator vaginally at bedtime. Use for 3 days. (Patient taking differently: Place 1 applicator vaginally at bedtime as needed (Yeast). Use for 3 days.) 20 g 1   traMADol (ULTRAM) 50 MG tablet Take 1 tablet (50 mg total) by mouth every 6 (six) hours as needed for moderate pain. 30 tablet 0   No current facility-administered medications for this visit.    Physical Exam: ***  Diagnostic Tests: ***  A/P:  S/P Sternotomy with Redo Mitral Valve Replacement CHB, PPM placed 10/23 RUE swelling  Ellwood Handler, PA-C [facility] [phone]

## 2022-02-28 NOTE — Progress Notes (Signed)
Cardiology Office Note Date:  02/28/2022  Patient ID:  Elaine Le, Elaine Le 04-Jul-1960, MRN [id number] PCP:  Leeroy Cha, MD  Cardiologist:  Dr. Percival Spanish Electrophysiologist: Dr. Quentin Ore    Chief Complaint:  wound check  History of Present Illness: Elaine Le is a 61 y.o. female with history of COPD, rheumatic VHD s/p MVR 2017   MS unfortunately developed severe prosthetic MV MR and MS and underwent re-do MV Replacement 02/11/22 (bioprosthetic noting a coumadin allergy) > CHB > PPM Post-op AFib/flutter Discharged 02/21/22 with 7 day course of lasix  TODAY She is accompanied by her sister. She is doing well L shoulder is a little achy, but she thinks because she has been holding it/keeping it tucked/tight in to protect her pacer site. No wound concerns No CP, no SOB No dizzy spells, near syncope or syncope.  She sees CTS team next week  No bleeding or signs of bleeding   Device information Biotronik dual chamber PPM implanted 02/17/22 RV lead is in LB position  Past Medical History:  Diagnosis Date   Acute respiratory failure with hypercapnia ([place]) 62/95/2841   Acute systolic heart failure ([place]) 09/2014   Adenomatous colon polyp    Arthritis    Back pain    Chronic diastolic heart failure (HCC)    related to MR/MS   Chronic pain syndrome    Chronic sinusitis    COPD (chronic obstructive pulmonary disease)-PFT pending  07/17/2014   Diverticulosis    Dyspnea    Fibromyalgia    GERD (gastroesophageal reflux disease)    Headaches, cluster    Heart murmur    Hiatal hernia    Hx of adenomatous colonic polyps 03/10/2011   Oct 2012, repeat colon Oct 2017    Hypothyroidism    Internal hemorrhoids    Irritable bowel syndrome (IBS)    Mitral valve regurgitation    severe   Mitral valve stenosis, moderate 07/11/2014   Pneumonia    Feb. 24, 2016   Protein-calorie malnutrition, severe ([place]) 07/06/2014   Rheumatic disease of mitral valve  07/10/2014   Severe mitral regurgitation with moderate mitral stenosis   S/P minimally invasive mitral valve replacement with bioprosthetic valve 11/08/2014   25 mm [facility] Mitral bovine bioprosthetic tissue valve placed via right mini thoracotomy approach   Sleep apnea    uses CPAP-setting 7   Tobacco abuse     Past Surgical History:  Procedure Laterality Date   CYSTOSTOMY W/ BLADDER DILATION     at age 65   LEFT AND RIGHT HEART CATHETERIZATION WITH CORONARY ANGIOGRAM N/A 07/27/2014   Procedure: LEFT AND RIGHT HEART CATHETERIZATION WITH CORONARY ANGIOGRAM;  Surgeon: Burnell Blanks, MD;  Location: [facility] CATH LAB;  Right left heart catheterization: Mild/minimal coronary artery disease. RV: 50/7/17, PA: 48/29 (mean 38), PCWP 33 mmHg   MITRAL VALVE REPAIR Right 11/08/2014   Procedure: MINIMALLY INVASIVE MITRAL VALVE REPLACEMENT(MVR);  Surgeon: Rexene Alberts, MD;  Location: [facility];  Service: Open Heart Surgery;  Laterality: Right;   MITRAL VALVE REPLACEMENT N/A 02/11/2022   Procedure: REDO MITRAL VALVE REPLACEMENT (MVR) VIA STERNOTOMY USING 27 MM MOSAIC PORCINE MITRAL VALVE;  Surgeon: [name], MD;  Location: [facility];  Service: Open Heart Surgery;  Laterality: N/A;   NEUROPLASTY / TRANSPOSITION MEDIAN NERVE AT CARPAL TUNNEL BILATERAL     PACEMAKER IMPLANT N/A 02/17/2022   Procedure: PACEMAKER IMPLANT;  Surgeon: Vickie Epley, MD;  Location: [facility] CV LAB;  Service: Cardiovascular;  Laterality: N/A;   RIGHT/LEFT HEART CATH AND CORONARY ANGIOGRAPHY N/A 12/26/2021   Procedure: RIGHT/LEFT HEART CATH AND CORONARY ANGIOGRAPHY;  Surgeon: Sherren Mocha, MD;  Location: [facility] CV LAB;  Service: Cardiovascular;  Laterality: N/A;   TEE WITHOUT CARDIOVERSION N/A 07/10/2014   Procedure: TRANSESOPHAGEAL ECHOCARDIOGRAM (TEE);  Surgeon: Lelon Perla, MD;  Location: [facility] ENDOSCOPY;  Service: Cardiovascular;  55-60%. No regional wall motion modalities. Moderate mitral stenosis with  severe regurgitation and moderate to severely dilated left atrium.   TEE WITHOUT CARDIOVERSION N/A 11/08/2014   Procedure: TRANSESOPHAGEAL ECHOCARDIOGRAM (TEE);  Surgeon: Rexene Alberts, MD;  Location: [facility];  Service: Open Heart Surgery;  Laterality: N/A;   TEE WITHOUT CARDIOVERSION N/A 06/07/2021   Procedure: TRANSESOPHAGEAL ECHOCARDIOGRAM (TEE);  Surgeon: Sueanne Margarita, MD;  Location: [facility] ENDOSCOPY;  Service: Cardiovascular;  Laterality: N/A;   TEE WITHOUT CARDIOVERSION N/A 02/11/2022   Procedure: TRANSESOPHAGEAL ECHOCARDIOGRAM (TEE);  Surgeon: [name], MD;  Location: [facility];  Service: Open Heart Surgery;  Laterality: N/A;   THORACIC OUTLET SURGERY     TRANSTHORACIC ECHOCARDIOGRAM  07/04/2014   Normal LV Size/Fnx - EF 60-65% no RWMA; MV: thickened leaflets with doming, fixed Posterior leaflet, anterior leaflet w/ large/shaggy & mobile density.  c/w Rheumatic Mitral disease - moderate MS & Mod-Severe MR.   TUBAL LIGATION     VULVA SURGERY      Current Outpatient Medications  Medication Sig Dispense Refill   acetaminophen (TYLENOL) 325 MG tablet Take 1-2 tablets (325-650 mg total) by mouth every 4 (four) hours as needed for mild pain.     albuterol (VENTOLIN HFA) 108 (90 Base) MCG/ACT inhaler Inhale 2 puffs into the lungs every 6 (six) hours as needed. 18 g 5   apixaban (ELIQUIS) 5 MG TABS tablet Take 1 tablet (5 mg total) by mouth 2 (two) times daily. 60 tablet 3   aspirin EC 81 MG tablet Take 1 tablet (81 mg total) by mouth daily. Swallow whole. 30 tablet 12   clobetasol (TEMOVATE) 0.05 % external solution Apply 1 application  topically daily as needed (scalp irritation.).     estradiol (ESTRACE) 0.1 MG/GM vaginal cream 1 gram vaginally twice weekly (Patient taking differently: Place 1 Applicatorful vaginally daily as needed (vaginal swelling).) 42.5 g 6   fluticasone (FLONASE) 50 MCG/ACT nasal spray Place 2 sprays into both nostrils at bedtime.     furosemide (LASIX) 20 MG tablet  Take 1 tablet (20 mg total) by mouth daily. (Patient taking differently: Take 20 mg by mouth daily as needed for edema.) 90 tablet 3   furosemide (LASIX) 40 MG tablet Take 1 tablet (40 mg total) by mouth daily. 7 tablet 0   hydrocortisone cream 1 % Apply 1 Application topically as needed for itching.     ketoconazole (NIZORAL) 2 % shampoo Apply 1 Application topically daily as needed (scalp irritation.).     liothyronine (CYTOMEL) 5 MCG tablet Take 5 mcg by mouth daily.   2   metoprolol tartrate (LOPRESSOR) 25 MG tablet Take 0.5 tablets (12.5 mg total) by mouth 2 (two) times daily. 30 tablet 3   metroNIDAZOLE (METROCREAM) 0.75 % cream Apply 1 application  topically in the morning. Face     potassium chloride SA (KLOR-CON M) 20 MEQ tablet Take 1 tablet (20 mEq total) by mouth daily. 30 tablet 3   SYNTHROID 75 MCG tablet Take 75 mcg by mouth daily before breakfast.      terconazole (TERAZOL 3) 0.8 % vaginal cream Place  1 applicator vaginally at bedtime. Use for 3 days. (Patient taking differently: Place 1 applicator vaginally at bedtime as needed (Yeast). Use for 3 days.) 20 g 1   traMADol (ULTRAM) 50 MG tablet Take 1 tablet (50 mg total) by mouth every 6 (six) hours as needed for moderate pain. 30 tablet 0   No current facility-administered medications for this visit.    Allergies:   Estrace [estradiol], Reglan [metoclopramide], Warfarin and related, Other, Morphine and related, Singulair [montelukast sodium], and Statins   Social History:  The patient  reports that she quit smoking about 7 years ago. Her smoking use included cigarettes. She has a 17.50 pack-year smoking history. She has never used smokeless tobacco. She reports that she does not drink alcohol and does not use drugs.   Family History:  The patient's family history includes Breast cancer in her paternal aunt; Colon polyps in her mother; Diabetes in her brother and sister; Irritable bowel syndrome in her daughter and mother; Kidney  cancer in her mother; Liver disease in her sister.  ROS:  Please see the history of present illness.    All other systems are reviewed and otherwise negative.   PHYSICAL EXAM:  VS:  There were no vitals taken for this visit. BMI: There is no height or weight on file to calculate BMI. Well nourished, well developed, in no acute distress HEENT: normocephalic, atraumatic Neck: no JVD, carotid bruits or masses Cardiac:  RRR; no significant murmurs, no rubs, or gallops Lungs:  CTA b/l, no wheezing, rhonchi or rales Thoracotomy looks stable, CDI Abd: soft, nontender MS: no deformity or atrophy Ext: she has 1+ edema L, trace on the R (reported as since surgery and markedly improved) Skin: warm and dry, no rash Neuro:  No gross deficits appreciated Psych: euthymic mood, full affect  PPM site : steri strips are removed without difficulty Wound edges are well approximated, no erythema, edema, heat, no fluctuation or tenderness   EKG:  not done today  Device interrogation done today and reviewed by myself:  Battery and lead measurements are ok RV lead threshold up some from implant at 1.4/0.4 Acute implant outputs remain 3 AF episodes, longest 3 hours occurred while still in the hospital   12/26/21: LHC 1.  Patent coronary arteries with minimal irregularity, codominant coronary distribution 2.  Large V waves of 41 mmHg consistent with severe mitral regurgitation 3.  Mean transmitral gradient of 27 mmHg, calculated mitral valve area of 0.8 cm.  Gradients are falsely elevated from large V waves related to severe MR. 4.  Moderate pulmonary hypertension with mean PA pressure 41 mmHg, transpulmonary gradient 8 mmHg, PVR less than 2 Wood units   Recommend: Surgical referral to evaluate treatment options for mitral valve replacement       12/16/21; TTE 1. 25 mm bioprosthesis. MG 14 @ 78 bpm. Emax 2.5 m/s, EOA 1.14 cm2, DVI  2.6, PHT 72 ms. All suggest severe regurgitation. Based on prior  TEE, MR  is severe. All parameters point to severe MR to explain elevated gradient.  The mitral valve has been  repaired/replaced. Severe mitral valve regurgitation. There is a 25 mm  Edwards Magna bioprosthetic valve present in the mitral position.  Procedure Date: 11/08/2014.   2. Left ventricular ejection fraction, by estimation, is 65 to 70%. The  left ventricle has normal function. The left ventricle has no regional  wall motion abnormalities. Left ventricular diastolic function could not  be evaluated.   3. Right ventricular systolic  function is normal. The right ventricular  size is normal. Tricuspid regurgitation signal is inadequate for assessing  PA pressure.   4. Left atrial size was mild to moderately dilated.   5. The aortic valve is tricuspid. Aortic valve regurgitation is not  visualized. No aortic stenosis is present.   6. The inferior vena cava is normal in size with greater than 50%  respiratory variability, suggesting right atrial pressure of 3 mmHg.   Comparison(s): No significant change from prior study. MR remains severe.   Recent Labs: 12/23/2021: BNP 181.3 02/11/2022: TSH 0.835 02/14/2022: ALT 14 02/16/2022: Magnesium 2.1 02/20/2022: Hemoglobin 8.3; Platelets 312 02/21/2022: BUN 6; Creatinine, Ser 0.65; Potassium 3.6; Sodium 135  No results found for requested labs within last 365 days.   Estimated Creatinine Clearance: 53 mL/min (by C-G formula based on SCr of 0.65 mg/dL).   Wt Readings from Last 3 Encounters:  02/21/22 119 lb 14.9 oz (54.4 kg)  02/07/22 118 lb (53.5 kg)  02/03/22 118 lb (53.5 kg)     Other studies reviewed: Additional studies/records reviewed today include: summarized above  ASSESSMENT AND PLAN:  PPM Site is healing well, no signs of infection Remaining wound care and activity restrictions are reviewed, encouraged to relax her shoulder, ambulate with her normal stride/arm swing.  She has her monitor plugged in at home Threshold  testing is on monitor  3. AFlutter Post-op 3% burden, AMS episodes are from in-patient CHA2DS2vasc is one, on Eliquis, appropriately dosed  4. VHD S/p re-do MVR (bioprosthetic She has a coumadin allergy On ASA, Eliquis    Disposition: F/u with Dr. Quentin Ore as scheduled, sooner if needed.  Current medicines are reviewed at length with the patient today.  The patient did not have any concerns regarding medicines.  Venetia Night, PA-C 02/28/2022 5:59 AM     [facility] [address] [place] [address] [phone] (office)  [phone] (fax)

## 2022-02-28 NOTE — Patient Instructions (Signed)

## 2022-02-28 NOTE — Patient Instructions (Signed)
Medication Instructions:   Your physician recommends that you continue on your current medications as directed. Please refer to the Current Medication list given to you today.  *If you need a refill on your cardiac medications before your next appointment, please call your pharmacy*   Lab Work: [place]    If you have labs (blood work) drawn today and your tests are completely normal, you will receive your results only by: [place] (if you have MyChart) OR A paper copy in the mail If you have any lab test that is abnormal or we need to change your treatment, we will call you to review the results.   Testing/Procedures: NONE ORDERED  TODAY     Follow-Up: At [facility], you and your health needs are our priority.  As part of our continuing mission to provide you with exceptional heart care, we have created designated Provider Care Teams.  These Care Teams include your primary Cardiologist (physician) and Advanced Practice Providers (APPs -  Physician Assistants and Nurse Practitioners) who all work together to provide you with the care you need, when you need it.  We recommend signing up for the patient portal called "MyChart".  Sign up information is provided on this After Visit Summary.  MyChart is used to connect with patients for Virtual Visits (Telemedicine).  Patients are able to view lab/test results, encounter notes, upcoming appointments, etc.  Non-urgent messages can be sent to your provider as well.   To learn more about what you can do with MyChart, go to [email or web address].    Your next appointment:  AS SCHEDULED   The format for your next appointment:   In Person  Provider:   Lars Mage, MD     Other Instructions   Important Information About [place]

## 2022-03-03 ENCOUNTER — Ambulatory Visit (INDEPENDENT_AMBULATORY_CARE_PROVIDER_SITE_OTHER): Payer: Self-pay | Admitting: Physician Assistant

## 2022-03-03 ENCOUNTER — Telehealth: Payer: Self-pay

## 2022-03-03 ENCOUNTER — Ambulatory Visit
Admission: RE | Admit: 2022-03-03 | Discharge: 2022-03-03 | Disposition: A | Discharge status: Home / Self Care | Payer: Medicare Other | Source: Ambulatory Visit | Attending: Thoracic Surgery (Cardiothoracic Vascular Surgery) | Admitting: Thoracic Surgery (Cardiothoracic Vascular Surgery)

## 2022-03-03 VITALS — BP 129/66 | HR 83 | Resp 20 | Ht 60.0 in | Wt 115.0 lb

## 2022-03-03 DIAGNOSIS — Z9889 Other specified postprocedural states: Secondary | ICD-10-CM

## 2022-03-03 DIAGNOSIS — Z952 Presence of prosthetic heart valve: Secondary | ICD-10-CM

## 2022-03-03 LAB — SURGICAL PATHOLOGY

## 2022-03-03 MED ORDER — FUROSEMIDE 20 MG PO TABS: 20.0000 mg | ORAL_TABLET | Freq: Every day | ORAL | 3 refills | Status: DC | PRN

## 2022-03-03 MED ORDER — POTASSIUM CHLORIDE CRYS ER 20 MEQ PO TBCR: 20.0000 meq | EXTENDED_RELEASE_TABLET | Freq: Every day | ORAL | 3 refills | Status: DC | PRN

## 2022-03-03 MED ORDER — METOPROLOL TARTRATE 25 MG PO TABS: 25.0000 mg | ORAL_TABLET | Freq: Two times a day (BID) | ORAL | 3 refills | Status: DC

## 2022-03-03 NOTE — Telephone Encounter (Signed)
The patient called and states her monitor is plugged in and say ok. I looked in Biotronik and see the transmission.

## 2022-03-03 NOTE — Telephone Encounter (Signed)
Received the following alert from [employer]:  The patient is scheduled for an HM follow-up on Mar 01, 2022 The HM follow-up transmission has not arrived yet. The time of arrival depends on the HM transmission time of the implanted device.  Attempted to reach patient to ensure that her remote monitor is connected and working correctly.  No answer, LM on VM (okay per DPR) for patient to call and let us walk her through ensuring remote connection is restored.

## 2022-03-04 ENCOUNTER — Telehealth (HOSPITAL_COMMUNITY): Payer: Self-pay

## 2022-03-04 NOTE — Telephone Encounter (Signed)
Pt insurance is active and benefits verified through Medicare a/b Co-pay 0, DED $226/$226 met, out of pocket 0/0 met, co-insurance 20%. no pre-authorization required. Passport, 03/04/2022_0 :07pm, REF# [id number]  How many CR sessions are covered? (72 sessions for ICR)72 Is this a lifetime maximum or an annual maximum? annual Has the member used any of these services to date? Yes to TCR no to ICR Is there a time limit (weeks/months) on start of program and/or program completion? No  2ndary insurance is active and benefits verified through Medicaid. Co-pay 0, DED 0/0 met, out of pocket 0/ 0 met, co-insurance 0%. No pre-authorization required. Passport, 03/04/2022_1 :10pm, REF# [id number]  Pt insurance is active through Medicaid. Ref# [id number]  Will fax over Medicaid Reimbursement form to Dr. Percival Spanish  Will contact patient to see if she is interested in the Cardiac Rehab Program. If interested, patient will need to complete follow up appt. Once completed, patient will be contacted for scheduling upon review by the RN Navigator.

## 2022-03-05 NOTE — Telephone Encounter (Signed)
Transmission was received on 03/03/22 and was normal, no anomalies.

## 2022-03-09 NOTE — Progress Notes (Unsigned)
Cardiology Office Note   Date:  03/10/2022   ID:  Elaine Le, DOB Dec 21, 1960, MRN [id number]  PCP:  Leeroy Cha, MD  Cardiologist:   Minus Breeding, MD   Chief Complaint  Patient presents with   MVR      History of Present Illness: Elaine Le is a 61 y.o. female who presents for follow-up after mitral valve replacement. She had probable rheumatic disease with severe regurgitation and moderate stenosis and is status post minimally invasive valve replacement.   TEE in Feb 2023 suggested moderate MR with moderate stenosis.  She had an TTE last week.   The mitral valve has severe regurgitation with a 25 mm Edwards Magna bioprosthetic valve.  There was severe MR.  The EF is 65%.   She is now status redo mitral valve replacement.  She had CHB and required pacemaker placement.    She had post op atrial fib.  She had adhesions from her previous surgery.  She had difficult anatomy for adequate exposure.  She did have a 25 mm pericardial Maitra valve placed.  Her left atrial appendage was oversewn.  TEE after coming off bypass demonstrated central regurgitation of the leaflets.  Patient was placed back on bypass.  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  She had this time of Mosaic valve placed.  She comes back today and she has been slowly improving.  She has continued left leg swelling.  This might be slowly getting better.  She does have continue SOB.  Walking around the office today she did drop her oxygen saturations to the high 80s but she came back up easily with rest.  She is not having any new chest pressure, neck or arm discomfort.  She is not having any new palpitations, presyncope or syncope.   Past Medical History:  Diagnosis Date   Acute respiratory failure with hypercapnia ([place]) 50/27/7412   Acute systolic heart failure ([place]) 09/2014   Adenomatous colon polyp    Arthritis    Back pain    Chronic diastolic heart  failure ([facility])    related to MR/MS   Chronic pain syndrome    Chronic sinusitis    COPD (chronic obstructive pulmonary disease)-PFT pending  07/17/2014   Diverticulosis    Dyspnea    Fibromyalgia    GERD (gastroesophageal reflux disease)    Headaches, cluster    Heart murmur    Hiatal hernia    Hx of adenomatous colonic polyps 03/10/2011   Oct 2012, repeat colon Oct 2017    Hypothyroidism    Internal hemorrhoids    Irritable bowel syndrome (IBS)    Mitral valve regurgitation    severe   Mitral valve stenosis, moderate 07/11/2014   Pneumonia    Feb. 24, 2016   Protein-calorie malnutrition, severe ([place]) 07/06/2014   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/10/2014   Severe mitral regurgitation with moderate mitral stenosis   S/P minimally invasive mitral valve replacement with bioprosthetic valve 11/08/2014   25 mm [facility] Mitral bovine bioprosthetic tissue valve placed via right mini thoracotomy approach   Sleep apnea    uses CPAP-setting 7   Tobacco abuse     Past Surgical History:  Procedure Laterality Date   CYSTOSTOMY W/ BLADDER DILATION     at age 82   LEFT AND RIGHT HEART CATHETERIZATION WITH CORONARY ANGIOGRAM N/A 07/27/2014   Procedure: LEFT AND RIGHT HEART CATHETERIZATION WITH CORONARY ANGIOGRAM;  Surgeon:  Burnell Blanks, MD;  Location: [facility] CATH LAB;  Right left heart catheterization: Mild/minimal coronary artery disease. RV: 50/7/17, PA: 48/29 (mean 38), PCWP 33 mmHg   MITRAL VALVE REPAIR Right 11/08/2014   Procedure: MINIMALLY INVASIVE MITRAL VALVE REPLACEMENT(MVR);  Surgeon: Rexene Alberts, MD;  Location: [place];  Service: Open Heart Surgery;  Laterality: Right;   MITRAL VALVE REPLACEMENT N/A 02/11/2022   Procedure: REDO MITRAL VALVE REPLACEMENT (MVR) VIA STERNOTOMY USING 27 MM MOSAIC PORCINE MITRAL VALVE;  Surgeon: [name], MD;  Location: [place];  Service: Open Heart Surgery;  Laterality: N/A;   NEUROPLASTY / TRANSPOSITION MEDIAN NERVE AT CARPAL  TUNNEL BILATERAL     PACEMAKER IMPLANT N/A 02/17/2022   Procedure: PACEMAKER IMPLANT;  Surgeon: Vickie Epley, MD;  Location: [place] CV LAB;  Service: Cardiovascular;  Laterality: N/A;   RIGHT/LEFT HEART CATH AND CORONARY ANGIOGRAPHY N/A 12/26/2021   Procedure: RIGHT/LEFT HEART CATH AND CORONARY ANGIOGRAPHY;  Surgeon: Sherren Mocha, MD;  Location: [place] CV LAB;  Service: Cardiovascular;  Laterality: N/A;   TEE WITHOUT CARDIOVERSION N/A 07/10/2014   Procedure: TRANSESOPHAGEAL ECHOCARDIOGRAM (TEE);  Surgeon: Lelon Perla, MD;  Location: [facility] ENDOSCOPY;  Service: Cardiovascular;  55-60%. No regional wall motion modalities. Moderate mitral stenosis with severe regurgitation and moderate to severely dilated left atrium.   TEE WITHOUT CARDIOVERSION N/A 11/08/2014   Procedure: TRANSESOPHAGEAL ECHOCARDIOGRAM (TEE);  Surgeon: Rexene Alberts, MD;  Location: [place];  Service: Open Heart Surgery;  Laterality: N/A;   TEE WITHOUT CARDIOVERSION N/A 06/07/2021   Procedure: TRANSESOPHAGEAL ECHOCARDIOGRAM (TEE);  Surgeon: Sueanne Margarita, MD;  [facility] ENDOSCOPY;  Service: Cardiovascular;  Laterality: N/A;   TEE WITHOUT CARDIOVERSION N/A 02/11/2022   Procedure: TRANSESOPHAGEAL ECHOCARDIOGRAM (TEE);  Surgeon: [name], MD;  Location: [place];  Service: Open Heart Surgery;  Laterality: N/A;   THORACIC OUTLET SURGERY     TRANSTHORACIC ECHOCARDIOGRAM  07/04/2014   Normal LV Size/Fnx - EF 60-65% no RWMA; MV: thickened leaflets with doming, fixed Posterior leaflet, anterior leaflet w/ large/shaggy & mobile density.  c/w Rheumatic Mitral disease - moderate MS & Mod-Severe MR.   TUBAL LIGATION     VULVA SURGERY       Current Outpatient Medications  Medication Sig Dispense Refill   albuterol (VENTOLIN HFA) 108 (90 Base) MCG/ACT inhaler Inhale 2 puffs into the lungs every 6 (six) hours as needed. 18 g 5   apixaban (ELIQUIS) 5 MG TABS tablet Take 1 tablet (5 mg total) by mouth 2 (two) times  daily. 60 tablet 3   clobetasol (TEMOVATE) 0.05 % external solution Apply 1 application  topically daily as needed (scalp irritation.).     estradiol (ESTRACE) 0.1 MG/GM vaginal cream 1 gram vaginally twice weekly 42.5 g 6   fluticasone (FLONASE) 50 MCG/ACT nasal spray Place 2 sprays into both nostrils at bedtime.     furosemide (LASIX) 20 MG tablet Take 1 tablet (20 mg total) by mouth daily as needed. For weight gain of 3 lbs in 24 or 5 lbs in 48 hours 90 tablet 3   hydrocortisone cream 1 % Apply 1 Application topically as needed for itching.     ketoconazole (NIZORAL) 2 % shampoo Apply 1 Application topically daily as needed (scalp irritation.).     liothyronine (CYTOMEL) 5 MCG tablet Take 5 mcg by mouth daily.   2   metoprolol tartrate (LOPRESSOR) 25 MG tablet Take 1 tablet (25 mg total) by mouth 2 (two) times daily. 60 tablet 3  metroNIDAZOLE (METROCREAM) 0.75 % cream Apply 1 application  topically in the morning. Face     potassium chloride SA (KLOR-CON M) 20 MEQ tablet Take 1 tablet (20 mEq total) by mouth daily as needed. 30 tablet 3   SYNTHROID 75 MCG tablet Take 75 mcg by mouth daily before breakfast.      terconazole (TERAZOL 3) 0.8 % vaginal cream Place 1 applicator vaginally at bedtime. Use for 3 days. 20 g 1   acetaminophen (TYLENOL) 325 MG tablet Take 1-2 tablets (325-650 mg total) by mouth every 4 (four) hours as needed for mild pain. (Patient not taking: Reported on 03/10/2022)     No current facility-administered medications for this visit.    Allergies:   Estrace [estradiol], Reglan [metoclopramide], Warfarin and related, Other, Morphine and related, Singulair [montelukast sodium], and Statins    ROS:  Please see the history of present illness.   Otherwise, review of systems are positive for none . All other systems are reviewed and negative.    PHYSICAL EXAM: VS:  BP 128/74   Pulse 80   Ht 5' (1.524 m)   Wt 118 lb (53.5 kg)   SpO2 100%   BMI 23.05 kg/m  , BMI Body  mass index is 23.05 kg/m.  GENERAL:  Well appearing NECK:  No jugular venous distention, waveform within normal limits, carotid upstroke brisk and symmetric, no bruits, no thyromegaly LUNGS:  Clear to auscultation bilaterally CHEST:  Well healed sternotomy scar, well healed pacer pocket HEART:  PMI not displaced or sustained,S1 and S2 within normal limits, no S3, no S4, no clicks, no rubs, 3 out of 6 systolic murmur radiating slightly at aortic outflow tract and heard over the entire anterior precordium, no diastolic murmurs ABD:  Flat, positive bowel sounds normal in frequency in pitch, no bruits, no rebound, no guarding, no midline pulsatile mass, no hepatomegaly, no splenomegaly EXT:  2 plus pulses throughout, mild left nonpitting leg edema, no cyanosis no clubbing  EKG:  EKG is not ordered today. NA  Recent Labs: 11/09/2014: Magnesium 2.1 01/10/2015: ALT 27; BUN 9; Creatinine, Ser 0.70; Potassium 4.3; Sodium 139 02/01/2015: Hemoglobin 15.1*; Platelets 292.0 03/29/2015: TSH 4.03    Lipid Panel    Component Value Date/Time   CHOL 256 (H) 06/26/2020 1124   TRIG 50.0 06/26/2020 1124   HDL 73.50 06/26/2020 1124   CHOLHDL 3 06/26/2020 1124   VLDL 10.0 06/26/2020 1124   LDLCALC 172 (H) 06/26/2020 1124      Wt Readings from Last 3 Encounters:  03/10/22 118 lb (53.5 kg)  03/03/22 115 lb (52.2 kg)  02/28/22 114 lb 12.8 oz (52.1 kg)      Other studies Reviewed: Additional studies/ records that were reviewed today include: Review of OR records, bedside echocardiogram today   ASSESSMENT AND PLAN:  MV REPLACEMENT:    She has had some continued shortness of breath.  She has slightly decreased breath sounds on the left but she had a chest x-ray recently.  She is not having any cough fevers or chills.  I did a bedside echo and she has a little bit of a gradient across her aortic valve which likely explains her murmur.  There was no evident mitral regurgitation or paravalvular leak.  She  sees Dr. Lavonna Monarch next week.  No change in therapy at this time.   DYSLIPIDEMIA:    She has been intolerant of statins.  No change in therapy.  PPM:   I reviewed the 02/28/22 device interrogation  for this appt.    ATRIAL FIB: She is on anticoagulation.  We will interrogate her device.  She had regular rhythm today.  SLEEP APNEA:     She continues to use CPAP.  Current medicines are reviewed at length with the patient today.  The patient does not have concerns regarding medicines.  The following changes have been made: None  Labs/ tests ordered today include:   None   Disposition:   FU with me in 2 months.    Signed, Minus Breeding, MD  03/10/2022 1:31 PM    [place]

## 2022-03-10 ENCOUNTER — Ambulatory Visit: Payer: Medicare Other | Attending: Cardiology | Admitting: Cardiology

## 2022-03-10 ENCOUNTER — Encounter: Payer: Self-pay | Admitting: Cardiology

## 2022-03-10 VITALS — BP 128/74 | HR 80 | Ht 60.0 in | Wt 118.0 lb

## 2022-03-10 DIAGNOSIS — Z950 Presence of cardiac pacemaker: Secondary | ICD-10-CM | POA: Diagnosis not present

## 2022-03-10 DIAGNOSIS — E785 Hyperlipidemia, unspecified: Secondary | ICD-10-CM | POA: Diagnosis not present

## 2022-03-10 DIAGNOSIS — Z952 Presence of prosthetic heart valve: Secondary | ICD-10-CM | POA: Diagnosis not present

## 2022-03-10 DIAGNOSIS — I480 Paroxysmal atrial fibrillation: Secondary | ICD-10-CM | POA: Diagnosis not present

## 2022-03-10 NOTE — Patient Instructions (Signed)
Medication Instructions:  STOP Aspirin   *If you need a refill on your cardiac medications before your next appointment, please call your pharmacy*  Lab Work: NONE ordered at this time of appointment   If you have labs (blood work) drawn today and your tests are completely normal, you will receive your results only by: [place] (if you have MyChart) OR A paper copy in the mail If you have any lab test that is abnormal or we need to change your treatment, we will call you to review the results.  Testing/Procedures: NONE ordered at this time of appointment   Follow-Up: At [facility], you and your health needs are our priority.  As part of our continuing mission to provide you with exceptional heart care, we have created designated Provider Care Teams.  These Care Teams include your primary Cardiologist (physician) and Advanced Practice Providers (APPs -  Physician Assistants and Nurse Practitioners) who all work together to provide you with the care you need, when you need it.  We recommend signing up for the patient portal called "MyChart".  Sign up information is provided on this After Visit Summary.  MyChart is used to connect with patients for Virtual Visits (Telemedicine).  Patients are able to view lab/test results, encounter notes, upcoming appointments, etc.  Non-urgent messages can be sent to your provider as well.   To learn more about what you can do with MyChart, go to [email or web address].    Your next appointment:   2 month(s)  The format for your next appointment:   In Person  Provider:   Minus Breeding, MD     Other Instructions  Important Information About Sugar

## 2022-03-17 ENCOUNTER — Ambulatory Visit (INDEPENDENT_AMBULATORY_CARE_PROVIDER_SITE_OTHER): Payer: Self-pay | Admitting: Thoracic Surgery (Cardiothoracic Vascular Surgery)

## 2022-03-17 ENCOUNTER — Other Ambulatory Visit: Payer: Self-pay

## 2022-03-17 ENCOUNTER — Encounter: Payer: Self-pay | Admitting: Thoracic Surgery (Cardiothoracic Vascular Surgery)

## 2022-03-17 VITALS — BP 127/61 | HR 77 | Resp 20 | Ht 60.0 in | Wt 111.0 lb

## 2022-03-17 DIAGNOSIS — Z952 Presence of prosthetic heart valve: Secondary | ICD-10-CM

## 2022-03-17 NOTE — Patient Instructions (Signed)
Follow up with Cardiology as planned [facility] for Cardiac rehab

## 2022-03-17 NOTE — Progress Notes (Signed)
Referral placed to outpatient cardiac rehab at [facility] per Dr. Tommi Rumps.

## 2022-03-17 NOTE — Progress Notes (Signed)
[address]       [place] [address]             [phone]           Elaine Le [place] Medical Record #[id number] Date of Birth: 21-Mar-1961  Minus Breeding, MD Leeroy Cha, MD  Chief Complaint:    Chief Complaint  Patient presents with   Follow-up    S/p Redo MVR    History of Present Illness:     Pt is now over a month out from redo MV replacement complicated by AV dissotiation. Pt has been feeling much better since surgery and reports that she is better than before surgery with more energy and not SOB. She has no lightheadeness or syncope. Pt has had some lower ext edema that has improved with lasix. She has no sternal discomfort and is anxious to start driving. She has heard from Cardiac rehab and wants to start classes. She has seen cardiology and had some desaturation with walking but bedside echo without significant outflow tract gradient per note.      Past Medical History:  Diagnosis Date   Acute respiratory failure with hypercapnia ([place]) 85/88/5027   Acute systolic heart failure ([place]) 09/2014   Adenomatous colon polyp    Arthritis    Back pain    Chronic diastolic heart failure ([facility])    related to MR/MS   Chronic pain syndrome    Chronic sinusitis    COPD (chronic obstructive pulmonary disease)-PFT pending  07/17/2014   Diverticulosis    Dyspnea    Fibromyalgia    GERD (gastroesophageal reflux disease)    Headaches, cluster    Heart murmur    Hiatal hernia    Hx of adenomatous colonic polyps 03/10/2011   Oct 2012, repeat colon Oct 2017    Hypothyroidism    Internal hemorrhoids    Irritable bowel syndrome (IBS)    Mitral valve regurgitation    severe   Mitral valve stenosis, moderate 07/11/2014   Pneumonia    Feb. 24, 2016   Protein-calorie malnutrition, severe ([place]) 07/06/2014   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/10/2014   Severe mitral regurgitation with moderate mitral stenosis   S/P minimally  invasive mitral valve replacement with bioprosthetic valve 11/08/2014   25 mm [facility] Mitral bovine bioprosthetic tissue valve placed via right mini thoracotomy approach   Sleep apnea    uses CPAP-setting 7   Tobacco abuse     Past Surgical History:  Procedure Laterality Date   CYSTOSTOMY W/ BLADDER DILATION     at age 61   LEFT AND RIGHT HEART CATHETERIZATION WITH CORONARY ANGIOGRAM N/A 07/27/2014   Procedure: LEFT AND RIGHT HEART CATHETERIZATION WITH CORONARY ANGIOGRAM;  Surgeon: Burnell Blanks, MD;  Location: [facility] CATH LAB;  Right left heart catheterization: Mild/minimal coronary artery disease. RV: 50/7/17, PA: 48/29 (mean 38), PCWP 33 mmHg   MITRAL VALVE REPAIR Right 11/08/2014   Procedure: MINIMALLY INVASIVE MITRAL VALVE REPLACEMENT(MVR);  Surgeon: Rexene Alberts, MD;  Location: [place];  Service: Open Heart Surgery;  Laterality: Right;   MITRAL VALVE REPLACEMENT N/A 02/11/2022   Procedure: REDO MITRAL VALVE REPLACEMENT (MVR) VIA STERNOTOMY USING 27 MM MOSAIC PORCINE MITRAL VALVE;  Surgeon: [name], MD;  Location: [place];  Service: Open Heart Surgery;  Laterality: N/A;   NEUROPLASTY / TRANSPOSITION MEDIAN NERVE AT CARPAL TUNNEL BILATERAL     PACEMAKER IMPLANT N/A 02/17/2022   Procedure: PACEMAKER IMPLANT;  Surgeon: Vickie Epley, MD;  Location: [place] CV LAB;  Service: Cardiovascular;  Laterality: N/A;   RIGHT/LEFT HEART CATH AND CORONARY ANGIOGRAPHY N/A 12/26/2021   Procedure: RIGHT/LEFT HEART CATH AND CORONARY ANGIOGRAPHY;  Surgeon: Sherren Mocha, MD;  Location: [place] CV LAB;  Service: Cardiovascular;  Laterality: N/A;   TEE WITHOUT CARDIOVERSION N/A 07/10/2014   Procedure: TRANSESOPHAGEAL ECHOCARDIOGRAM (TEE);  Surgeon: Lelon Perla, MD;  Location: [facility] ENDOSCOPY;  Service: Cardiovascular;  55-60%. No regional wall motion modalities. Moderate mitral stenosis with severe regurgitation and moderate to severely dilated left atrium.   TEE WITHOUT  CARDIOVERSION N/A 11/08/2014   Procedure: TRANSESOPHAGEAL ECHOCARDIOGRAM (TEE);  Surgeon: Rexene Alberts, MD;  Location: [place];  Service: Open Heart Surgery;  Laterality: N/A;   TEE WITHOUT CARDIOVERSION N/A 06/07/2021   Procedure: TRANSESOPHAGEAL ECHOCARDIOGRAM (TEE);  Surgeon: Sueanne Margarita, MD;  Location: [facility] ENDOSCOPY;  Service: Cardiovascular;  Laterality: N/A;   TEE WITHOUT CARDIOVERSION N/A 02/11/2022   Procedure: TRANSESOPHAGEAL ECHOCARDIOGRAM (TEE);  Surgeon: [name], MD;  Location: [place];  Service: Open Heart Surgery;  Laterality: N/A;   THORACIC OUTLET SURGERY     TRANSTHORACIC ECHOCARDIOGRAM  07/04/2014   Normal LV Size/Fnx - EF 60-65% no RWMA; MV: thickened leaflets with doming, fixed Posterior leaflet, anterior leaflet w/ large/shaggy & mobile density.  c/w Rheumatic Mitral disease - moderate MS & Mod-Severe MR.   TUBAL LIGATION     VULVA SURGERY      Social History   Tobacco Use  Smoking Status Former   Packs/day: 0.50   Years: 35.00   Total pack years: 17.50   Types: Cigarettes   Quit date: 06/17/2014   Years since quitting: 7.7  Smokeless Tobacco Never    Social History   Substance and Sexual Activity  Alcohol Use No    Social History   Socioeconomic History   Marital status: Single    Spouse name: Not on file   Number of children: 1   Years of education: Not on file   Highest education level: Not on file  Occupational History   Occupation: disabled  Tobacco Use   Smoking status: Former    Packs/day: 0.50    Years: 35.00    Total pack years: 17.50    Types: Cigarettes    Quit date: 06/17/2014    Years since quitting: 7.7   Smokeless tobacco: Never  Vaping Use   Vaping Use: Never used  Substance and Sexual Activity   Alcohol use: No   Drug use: No   Sexual activity: Never  Other Topics Concern   Not on file  Social History Narrative   Not on file   Social Determinants of Health   Financial Resource Strain: Not on file  Food  Insecurity: No Food Insecurity (02/19/2022)   Hunger Vital Sign    Worried About Running Out of Food in the Last Year: Never true    Ran Out of Food in the Last Year: Never true  Transportation Needs: No Transportation Needs (02/19/2022)   PRAPARE - Hydrologist (Medical): No    Lack of Transportation (Non-Medical): No  Physical Activity: Not on file  Stress: Not on file  Social Connections: Not on file  Intimate Partner Violence: Not At Risk (02/19/2022)   Humiliation, Afraid, Rape, and Kick questionnaire    Fear of Current or Ex-Partner: No    Emotionally Abused: No    Physically Abused: No    Sexually Abused:  No    Allergies  Allergen Reactions   Estrace [Estradiol] Hives    Name Brand can be used    Reglan [Metoclopramide] Hives   Warfarin And Related Hives and Rash   Other Hives and Other (See Comments)    Any antidepressants per pt- causes altered mental state, anger Other reaction(s): rash   Morphine And Related Other (See Comments)    Altered mental state-Patient reports she "made satan look sweet"     Singulair [Montelukast Sodium] Other (See Comments)    Intolerance - effects her mood    Statins Other (See Comments)    Myalgias with several statins    Current Outpatient Medications  Medication Sig Dispense Refill   albuterol (VENTOLIN HFA) 108 (90 Base) MCG/ACT inhaler Inhale 2 puffs into the lungs every 6 (six) hours as needed. 18 g 5   apixaban (ELIQUIS) 5 MG TABS tablet Take 1 tablet (5 mg total) by mouth 2 (two) times daily. (Patient taking differently: Take 2.5 mg by mouth 2 (two) times daily.) 60 tablet 3   clobetasol (TEMOVATE) 0.05 % external solution Apply 1 application  topically daily as needed (scalp irritation.).     estradiol (ESTRACE) 0.1 MG/GM vaginal cream 1 gram vaginally twice weekly 42.5 g 6   furosemide (LASIX) 20 MG tablet Take 1 tablet (20 mg total) by mouth daily as needed. For weight gain of 3 lbs in 24 or 5 lbs  in 48 hours 90 tablet 3   hydrocortisone cream 1 % Apply 1 Application topically as needed for itching.     ketoconazole (NIZORAL) 2 % shampoo Apply 1 Application topically daily as needed (scalp irritation.).     liothyronine (CYTOMEL) 5 MCG tablet Take 5 mcg by mouth daily.   2   metoprolol tartrate (LOPRESSOR) 25 MG tablet Take 1 tablet (25 mg total) by mouth 2 (two) times daily. 60 tablet 3   metroNIDAZOLE (METROCREAM) 0.75 % cream Apply 1 application  topically in the morning. Face     potassium chloride SA (KLOR-CON M) 20 MEQ tablet Take 1 tablet (20 mEq total) by mouth daily as needed. 30 tablet 3   SYNTHROID 75 MCG tablet Take 75 mcg by mouth daily before breakfast.      terconazole (TERAZOL 3) 0.8 % vaginal cream Place 1 applicator vaginally at bedtime. Use for 3 days. 20 g 1   No current facility-administered medications for this visit.     Family History  Problem Relation Age of Onset   Kidney cancer Mother    Colon polyps Mother    Irritable bowel syndrome Mother    Diabetes Sister    Liver disease Sister    Irritable bowel syndrome Daughter    Breast cancer Paternal Aunt    Diabetes Brother    Colon cancer Neg Hx    Thyroid disease Neg Hx        Physical Exam: BP 127/61 (BP Location: Right Arm, Patient Position: Sitting, Cuff Size: Normal)   Pulse 77   Resp 20   Ht 5' (1.524 m)   Wt 50.3 kg   SpO2 99% Comment: RA  BMI 21.68 kg/m   Lungs: overall clear Card: RR with soft systolic murmur Ext: trace edema   Diagnostic Studies & Laboratory data: I have personally reviewed the following studies and agree with the findings     Recent Radiology Findings:   No results found.    Recent Lab Findings: Lab Results  Component Value Date   WBC  7.4 02/20/2022   HGB 8.3 (L) 02/20/2022   HCT 25.8 (L) 02/20/2022   PLT 312 02/20/2022   GLUCOSE 130 (H) 02/21/2022   CHOL 256 (H) 06/26/2020   TRIG 50.0 06/26/2020   HDL 73.50 06/26/2020   LDLCALC 172 (H)  06/26/2020   ALT 14 02/14/2022   AST 30 02/14/2022   NA 135 02/21/2022   K 3.6 02/21/2022   CL 94 (L) 02/21/2022   CREATININE 0.65 02/21/2022   BUN 6 (L) 02/21/2022   CO2 32 02/21/2022   TSH 0.835 02/11/2022   INR 2.0 (H) 02/11/2022   HGBA1C 5.0 02/07/2022      Assessment / Plan:     SP Redo MV replacement Continues to improve physically and is very happy with process and result. We had a long discussion of her intraoperative hurdles and what her systolic murmur could be from (outflow tract issues) and how it appears not significant at this point from her symptoms and bedside echo. Will leave on her meds currently and we will clear her for cardiac rehab. Restrictions reviewed.    I have spent 13mn in review of the records, viewing studies and in face to face with patient and in coordination of future care    PCoralie Common11/20/2023 1:49 PM

## 2022-05-09 ENCOUNTER — Telehealth (HOSPITAL_COMMUNITY): Payer: Self-pay

## 2022-05-09 NOTE — Telephone Encounter (Signed)
RN called and confirmed Cardiac Rehab Appointment with patient on 05/13/22 at 0930. RN also completed the "Cardiac Rehab Risk Profile Nursing Assessment" with the patient. Instructions given and address and phone number provided.

## 2022-05-13 ENCOUNTER — Telehealth: Payer: Self-pay | Admitting: Cardiology

## 2022-05-13 ENCOUNTER — Encounter (HOSPITAL_COMMUNITY)
Admission: RE | Admit: 2022-05-13 | Discharge: 2022-05-13 | Disposition: A | Discharge status: Home / Self Care | Payer: Medicare Other | Source: Ambulatory Visit | Attending: Cardiology | Admitting: Cardiology

## 2022-05-13 ENCOUNTER — Encounter (HOSPITAL_COMMUNITY): Payer: Self-pay

## 2022-05-13 VITALS — BP 124/62 | HR 81 | Ht 60.25 in | Wt 119.3 lb

## 2022-05-13 DIAGNOSIS — Z952 Presence of prosthetic heart valve: Secondary | ICD-10-CM | POA: Insufficient documentation

## 2022-05-13 MED ORDER — METOPROLOL TARTRATE 25 MG PO TABS: 12.5000 mg | ORAL_TABLET | Freq: Two times a day (BID) | ORAL | 3 refills | Status: DC

## 2022-05-13 MED ORDER — APIXABAN 5 MG PO TABS: 5.0000 mg | ORAL_TABLET | Freq: Two times a day (BID) | ORAL | 3 refills | Status: DC

## 2022-05-13 NOTE — Telephone Encounter (Signed)
Patient's medication list corrected on how patient is taking medication. Patient gave Cardiac rehab  nurse  the information at her  rehab appointment 05/13/22.

## 2022-05-13 NOTE — Progress Notes (Signed)
Cardiac Rehab Medication Review by a nurse  Does the patient  feel that his/her medications are working for him/her?  yes  Has the patient been experiencing any side effects to the medications prescribed?  yes  Does the patient measure his/her own blood pressure or blood glucose at home?  NO  Does the patient have any problems obtaining medications due to transportation or finances? NO  Understanding of regimen: good Understanding of indications: good Potential of compliance: good    Nurse comments: Elaine Le is taking 5 mg of eliquis twice a day. It is documented on her medication list that she is taking a 1/2 tablet twice a day. Elaine Le says she is taking 1/2 of a metoprolol twice a day to equal 25 mg  daily. Will notify Dr Hocrein's office of the update. Elaine Le does not check her blood pressures on a regular basis.Elaine Gave RN BSN     Elaine Le Elaine Le 05/13/2022 10:45 AM

## 2022-05-13 NOTE — Telephone Encounter (Signed)
  Maria from Cardiac Rehab is calling because Ms Elaine Le is taking a couple of her medications differently than is listed in Epic.  apixaban (ELIQUIS) 5 MG TABS tablet -   Take 1 tablet (5 mg total) by mouth 2 (two) times daily.Patient taking differently: Take 2.5 mg by mouth 2 (two) times daily.   Verdis Frederickson stated patient is taking one 5 mg tablet 2 times daily   metoprolol tartrate (LOPRESSOR) 25 MG tablet -   Take 1 tablet (25 mg total) by mouth 2 (two) times daily.    Verdis Frederickson stated patient is taking 12.5 mg 2 times daily to equal '25mg'$   If there are any questions please call Verdis Frederickson at Cardiac Rehab

## 2022-05-13 NOTE — Progress Notes (Signed)
Cardiac Individual Treatment Plan  Patient Details  Name: Elaine Le MRN: [id number] Date of Birth: 1960-09-17 Referring Provider:   [facility] from 05/13/2022 in [facility], V[facility], & [place]  Referring Provider Minus Breeding, MD       Initial Encounter Date:  [place] from 05/13/2022 in [facility] for Heart, Vascular, & [facility]  Date 05/13/22       Visit Diagnosis: 02/11/22 S/P MVR , 02/17/22 S/P Pacemaker  Patient's Home Medications on Admission:  Current Outpatient Medications:    apixaban (ELIQUIS) 5 MG TABS tablet, Take 1 tablet (5 mg total) by mouth 2 (two) times daily. (Patient taking differently: Take 2.5 mg by mouth 2 (two) times daily.), Disp: 60 tablet, Rfl: 3   clobetasol (TEMOVATE) 0.05 % external solution, Apply 1 application  topically daily as needed (scalp irritation.)., Disp: , Rfl:    estradiol (ESTRACE) 0.1 MG/GM vaginal cream, 1 gram vaginally twice weekly, Disp: 42.5 g, Rfl: 6   furosemide (LASIX) 20 MG tablet, Take 1 tablet (20 mg total) by mouth daily as needed. For weight gain of 3 lbs in 24 or 5 lbs in 48 hours, Disp: 90 tablet, Rfl: 3   hydrocortisone cream 1 %, Apply 1 Application topically as needed for itching., Disp: , Rfl:    ketoconazole (NIZORAL) 2 % shampoo, Apply 1 Application topically daily as needed (scalp irritation.)., Disp: , Rfl:    liothyronine (CYTOMEL) 5 MCG tablet, Take 5 mcg by mouth daily. , Disp: , Rfl: 2   metoprolol tartrate (LOPRESSOR) 25 MG tablet, Take 1 tablet (25 mg total) by mouth 2 (two) times daily., Disp: 60 tablet, Rfl: 3   metroNIDAZOLE (METROCREAM) 0.75 % cream, Apply 1 application  topically in the morning. Face, Disp: , Rfl:    potassium chloride SA (KLOR-CON M) 20 MEQ tablet, Take 1 tablet (20 mEq total) by mouth daily as needed., Disp: 30 tablet, Rfl: 3   SYNTHROID 75  MCG tablet, Take 75 mcg by mouth daily before breakfast. , Disp: , Rfl:    terconazole (TERAZOL 3) 0.8 % vaginal cream, Place 1 applicator vaginally at bedtime. Use for 3 days., Disp: 20 g, Rfl: 1   albuterol (VENTOLIN HFA) 108 (90 Base) MCG/ACT inhaler, Inhale 2 puffs into the lungs every 6 (six) hours as needed. (Patient not taking: Reported on 05/13/2022), Disp: 18 g, Rfl: 5  Past Medical History: Past Medical History:  Diagnosis Date   Acute respiratory failure with hypercapnia ([place]) 89/38/1017   Acute systolic heart failure ([place]) 09/2014   Adenomatous colon polyp    Arthritis    Back pain    Chronic diastolic heart failure (HCC)    related to MR/MS   Chronic pain syndrome    Chronic sinusitis    COPD (chronic obstructive pulmonary disease)-PFT pending  07/17/2014   Diverticulosis    Dyspnea    Fibromyalgia    GERD (gastroesophageal reflux disease)    Headaches, cluster    Heart murmur    Hiatal hernia    Hx of adenomatous colonic polyps 03/10/2011   Oct 2012, repeat colon Oct 2017    Hypothyroidism    Internal hemorrhoids    Irritable bowel syndrome (IBS)    Mitral valve regurgitation    severe   Mitral valve stenosis, moderate 07/11/2014   Pneumonia    Feb. 24, 2016   Protein-calorie malnutrition, severe ([place]) 07/06/2014  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/10/2014   Severe mitral regurgitation with moderate mitral stenosis   S/P minimally invasive mitral valve replacement with bioprosthetic valve 11/08/2014   25 mm [facility] Mitral bovine bioprosthetic tissue valve placed via right mini thoracotomy approach   Sleep apnea    uses CPAP-setting 7   Tobacco abuse     Tobacco Use: Social History   Tobacco Use  Smoking Status Former   Packs/day: 0.50   Years: 35.00   Total pack years: 17.50   Types: Cigarettes   Quit date: 06/17/2014   Years since quitting: 7.9  Smokeless Tobacco Never    Labs: Review Flowsheet  More data exists      Latest Ref Rng &  Units 12/26/2021 02/07/2022 02/11/2022 02/12/2022 02/13/2022  Labs for ITP Cardiac and Pulmonary Rehab  Hemoglobin A1c 4.8 - 5.6 % - 5.0  - - -  PH, Arterial 7.35 - 7.45 7.386  7.47  7.319  7.293  7.305  7.308  7.435  7.483  7.363  7.417  7.478  7.429  7.396  -  PCO2 arterial 32 - 48 mmHg 36.6  33  39.8  42.1  43.3  40.6  36.7  35.4  46.0  42.9  37.3  40.0  36.9  -  Bicarbonate 20.0 - 28.0 mmol/L 22.0  23.2  24.0  20.7  20.7  21.6  20.6  24.6  26.5  26.1  27.7  26.3  27.7  26.5  22.8  -  TCO2 22 - 32 mmol/L 23  24  - '22  22  23  22  26  26  26  28  27  28  26  29  28  27  29  24  28  27  24  '$ -  Acid-base deficit 0.0 - 2.0 mmol/L 3.0  2.0  - 5.0  6.0  4.0  6.0  2.0  -  O2 Saturation % 93  68  98.6  98  66.7  99  74.5  98  92  100  100  100  100  77  100  100  71.8  68.6  71.8  99  66     Capillary Blood Glucose: Lab Results  Component Value Date   GLUCAP 99 02/19/2022   GLUCAP 107 (H) 02/19/2022   GLUCAP 105 (H) 02/18/2022   GLUCAP 128 (H) 02/18/2022   GLUCAP 132 (H) 02/18/2022     Exercise Target Goals: Exercise Program Goal: Individual exercise prescription set using results from initial 6 min walk test and THRR while considering  patient's activity barriers and safety.   Exercise Prescription Goal: Initial exercise prescription builds to 30-45 minutes a day of aerobic activity, 2-3 days per week.  Home exercise guidelines will be given to patient during program as part of exercise prescription that the participant will acknowledge.  Activity Barriers & Risk Stratification:  Activity Barriers & Cardiac Risk Stratification - 05/13/22 1131       Activity Barriers & Cardiac Risk Stratification   Activity Barriers Other (comment)    Comments Patient has some right sided numbness/pain from neck to right fingers that she is being evaluated for.    Cardiac Risk Stratification Moderate             6 Minute Walk:  6 Minute Walk     Row Name 05/13/22 1021         6 Minute  Walk   Phase Initial  Distance 1863 feet     Walk Time 6 minutes     # of Rest Breaks 0     MPH 3.53     METS 4.69     RPE 7     Perceived Dyspnea  0     VO2 Peak 16.42     Symptoms No     Resting HR 81 bpm     Resting BP 124/62     Resting Oxygen Saturation  97 %     Exercise Oxygen Saturation  during 6 min walk --  Unable to obtain, cold fingers     Max Ex. HR 116 bpm     Max Ex. BP 142/52     2 Minute Post BP 142/60              Oxygen Initial Assessment:   Oxygen Re-Evaluation:   Oxygen Discharge (Final Oxygen Re-Evaluation):   Initial Exercise Prescription:  Initial Exercise Prescription - 05/13/22 1100       Date of Initial Exercise RX and Referring Provider   Date 05/13/22    Referring Provider Minus Breeding, MD    Expected Discharge Date 07/11/22      Recumbant Bike   Level 1    Watts 45    Minutes 15    METs 4.6      Recumbant Elliptical   Level 1    Watts 60    Minutes 15    METs 4.5      Prescription Details   Frequency (times per week) 3    Duration Progress to 30 minutes of continuous aerobic without signs/symptoms of physical distress      Intensity   THRR 40-80% of Max Heartrate 64-127    Ratings of Perceived Exertion 11-13    Perceived Dyspnea 0-4      Progression   Progression Continue to progress workloads to maintain intensity without signs/symptoms of physical distress.      Resistance Training   Training Prescription Yes    Weight 2 lbs    Reps 10-15             Perform Capillary Blood Glucose checks as needed.  Exercise Prescription Changes:   Exercise Comments:   Exercise Goals and Review:   Exercise Goals     Row Name 05/13/22 1032             Exercise Goals   Increase Physical Activity Yes       Intervention Provide advice, education, support and counseling about physical activity/exercise needs.;Develop an individualized exercise prescription for aerobic and resistive training based on  initial evaluation findings, risk stratification, comorbidities and participant's personal goals.       Expected Outcomes Short Term: Attend rehab on a regular basis to increase amount of physical activity.;Long Term: Exercising regularly at least 3-5 days a week.;Long Term: Add in home exercise to make exercise part of routine and to increase amount of physical activity.       Increase Strength and Stamina Yes       Intervention Provide advice, education, support and counseling about physical activity/exercise needs.;Develop an individualized exercise prescription for aerobic and resistive training based on initial evaluation findings, risk stratification, comorbidities and participant's personal goals.       Expected Outcomes Short Term: Increase workloads from initial exercise prescription for resistance, speed, and METs.;Short Term: Perform resistance training exercises routinely during rehab and add in resistance training at home;Long Term: Improve cardiorespiratory fitness, muscular endurance and  strength as measured by increased METs and functional capacity (6MWT)       Able to understand and use rate of perceived exertion (RPE) scale Yes       Intervention Provide education and explanation on how to use RPE scale       Expected Outcomes Short Term: Able to use RPE daily in rehab to express subjective intensity level;Long Term:  Able to use RPE to guide intensity level when exercising independently       Knowledge and understanding of Target Heart Rate Range (THRR) Yes       Intervention Provide education and explanation of THRR including how the numbers were predicted and where they are located for reference       Expected Outcomes Short Term: Able to state/look up THRR;Short Term: Able to use daily as guideline for intensity in rehab;Long Term: Able to use THRR to govern intensity when exercising independently       Able to check pulse independently Yes       Intervention Provide education and  demonstration on how to check pulse in carotid and radial arteries.;Review the importance of being able to check your own pulse for safety during independent exercise       Expected Outcomes Long Term: Able to check pulse independently and accurately;Short Term: Able to explain why pulse checking is important during independent exercise       Understanding of Exercise Prescription Yes       Intervention Provide education, explanation, and written materials on patient's individual exercise prescription       Expected Outcomes Short Term: Able to explain program exercise prescription;Long Term: Able to explain home exercise prescription to exercise independently                Exercise Goals Re-Evaluation :   Discharge Exercise Prescription (Final Exercise Prescription Changes):   Nutrition:  Target Goals: Understanding of nutrition guidelines, daily intake of sodium '1500mg'$ , cholesterol '200mg'$ , calories 30% from fat and 7% or less from saturated fats, daily to have 5 or more servings of fruits and vegetables.  Biometrics:  Pre Biometrics - 05/13/22 0942       Pre Biometrics   Waist Circumference 32.25 inches    Hip Circumference 37.5 inches    Waist to Hip Ratio 0.86 %    Triceps Skinfold 16 mm    % Body Fat 32.7 %    Grip Strength 22 kg    Flexibility 20.25 in    Single Leg Stand 26.5 seconds              Nutrition Therapy Plan and Nutrition Goals:   Nutrition Assessments:  MEDIFICTS Score Key: ?70 Need to make dietary changes  40-70 Heart Healthy Diet ? 40 Therapeutic Level Cholesterol Diet    Picture Your Plate Scores: <57 Unhealthy dietary pattern with much room for improvement. 41-50 Dietary pattern unlikely to meet recommendations for good health and room for improvement. 51-60 More healthful dietary pattern, with some room for improvement.  >60 Healthy dietary pattern, although there may be some specific behaviors that could be improved.    Nutrition  Goals Re-Evaluation:   Nutrition Goals Re-Evaluation:   Nutrition Goals Discharge (Final Nutrition Goals Re-Evaluation):   Psychosocial: Target Goals: Acknowledge presence or absence of significant depression and/or stress, maximize coping skills, provide positive support system. Participant is able to verbalize types and ability to use techniques and skills needed for reducing stress and depression.  Initial Review & Psychosocial Screening:  Initial Psych Review & Screening - 05/13/22 1221       Initial Review   Current issues with None Identified      Family Dynamics   Good Support System? Yes   Jackelyn Poling has has daughter friends in the neighborhod , sister and Mom for support. [name] lives alone with her pets.     Barriers   Psychosocial barriers to participate in program There are no identifiable barriers or psychosocial needs.      Screening Interventions   Interventions Encouraged to exercise             Quality of Life Scores:  Quality of Life - 05/13/22 1141       Quality of Life   Select Quality of Life      Quality of Life Scores   Health/Function Pre 27.57 %    Socioeconomic Pre 28 %    Psych/Spiritual Pre 27.21 %    Family Pre 28.13 %    GLOBAL Pre 27.66 %            Scores of 19 and below usually indicate a poorer quality of life in these areas.  A difference of  2-3 points is a clinically meaningful difference.  A difference of 2-3 points in the total score of the Quality of Life Index has been associated with significant improvement in overall quality of life, self-image, physical symptoms, and general health in studies assessing change in quality of life.  PHQ-9: Review Flowsheet  More data exists      05/13/2022 06/18/2021 06/26/2020 10/08/2015 03/15/2015  Depression screen PHQ 2/9  Decreased Interest 0 0 0 0 0 0  Down, Depressed, Hopeless 0 0 1 0 0 0  PHQ - 2 Score 0 0 1 0 0 0  Altered sleeping 0 - - - -  Tired, decreased energy 0 - - - -   Change in appetite 0 - - - -  Feeling bad or failure about yourself  0 - - - -  Trouble concentrating 0 - - - -  Moving slowly or fidgety/restless 0 - - - -  Suicidal thoughts 0 - - - -  PHQ-9 Score 0 - - - -  Difficult doing work/chores Not difficult at all - - - -   Interpretation of Total Score  Total Score Depression Severity:  1-4 = Minimal depression, 5-9 = Mild depression, 10-14 = Moderate depression, 15-19 = Moderately severe depression, 20-27 = Severe depression   Psychosocial Evaluation and Intervention:   Psychosocial Re-Evaluation:   Psychosocial Discharge (Final Psychosocial Re-Evaluation):   Vocational Rehabilitation: Provide vocational rehab assistance to qualifying candidates.   Vocational Rehab Evaluation & Intervention:  Vocational Rehab - 05/13/22 1120       Initial Vocational Rehab Evaluation & Intervention   Assessment shows need for Vocational Rehabilitation No   Jackelyn Poling is disabled and does not need vocational rehab at this time.            Education: Education Goals: Education classes will be provided on a weekly basis, covering required topics. Participant will state understanding/return demonstration of topics presented.     Core Videos: Exercise    Move It!  Clinical staff conducted group or individual video education with verbal and written material and guidebook.  Patient learns the recommended Pritikin exercise program. Exercise with the goal of living a long, healthy life. Some of the health benefits of exercise include controlled diabetes, healthier blood pressure levels, improved cholesterol levels, improved heart and  lung capacity, improved sleep, and better body composition. Everyone should speak with their doctor before starting or changing an exercise routine.  Biomechanical Limitations Clinical staff conducted group or individual video education with verbal and written material and guidebook.  Patient learns how biomechanical  limitations can impact exercise and how we can mitigate and possibly overcome limitations to have an impactful and balanced exercise routine.  Body Composition Clinical staff conducted group or individual video education with verbal and written material and guidebook.  Patient learns that body composition (ratio of muscle mass to fat mass) is a key component to assessing overall fitness, rather than body weight alone. Increased fat mass, especially visceral belly fat, can put [place] at increased risk for metabolic syndrome, type 2 diabetes, heart disease, and even death. It is recommended to combine diet and exercise (cardiovascular and resistance training) to improve your body composition. Seek guidance from your physician and exercise physiologist before implementing an exercise routine.  Exercise Action Plan Clinical staff conducted group or individual video education with verbal and written material and guidebook.  Patient learns the recommended strategies to achieve and enjoy long-term exercise adherence, including variety, self-motivation, self-efficacy, and positive decision making. Benefits of exercise include fitness, good health, weight management, more energy, better sleep, less stress, and overall well-being.  Medical   Heart Disease Risk Reduction Clinical staff conducted group or individual video education with verbal and written material and guidebook.  Patient learns our heart is our most vital organ as it circulates oxygen, nutrients, white blood cells, and hormones throughout the entire body, and carries waste away. Data supports a plant-based eating plan like the Pritikin Program for its effectiveness in slowing progression of and reversing heart disease. The video provides a number of recommendations to address heart disease.   Metabolic Syndrome and Belly Fat  Clinical staff conducted group or individual video education with verbal and written material and guidebook.  Patient learns  what metabolic syndrome is, how it leads to heart disease, and how one can reverse it and keep it from coming back. You have metabolic syndrome if you have 3 of the following 5 criteria: abdominal obesity, high blood pressure, high triglycerides, low HDL cholesterol, and high blood sugar.  Hypertension and Heart Disease Clinical staff conducted group or individual video education with verbal and written material and guidebook.  Patient learns that high blood pressure, or hypertension, is very common in the [place]. Hypertension is largely due to excessive salt intake, but other important risk factors include being overweight, physical inactivity, drinking too much alcohol, smoking, and not eating enough potassium from fruits and vegetables. High blood pressure is a leading risk factor for heart attack, stroke, congestive heart failure, dementia, kidney failure, and premature death. Long-term effects of excessive salt intake include stiffening of the arteries and thickening of heart muscle and organ damage. Recommendations include ways to reduce hypertension and the risk of heart disease.  Diseases of Our Time - Focusing on Diabetes Clinical staff conducted group or individual video education with verbal and written material and guidebook.  Patient learns why the best way to stop diseases of our time is prevention, through food and other lifestyle changes. Medicine (such as prescription pills and surgeries) is often only a Band-Aid on the problem, not a long-term solution. Most common diseases of our time include obesity, type 2 diabetes, hypertension, heart disease, and cancer. The Pritikin Program is recommended and has been proven to help reduce, reverse, and/or prevent the damaging effects of  metabolic syndrome.  Nutrition   Overview of the Pritikin Eating Plan  Clinical staff conducted group or individual video education with verbal and written material and guidebook.  Patient learns about the  [place] for disease risk reduction. The [place] emphasizes a wide variety of unrefined, minimally-processed carbohydrates, like fruits, vegetables, whole grains, and legumes. Go, Caution, and Stop food choices are explained. Plant-based and lean animal proteins are emphasized. Rationale provided for low sodium intake for blood pressure control, low added sugars for blood sugar stabilization, and low added fats and oils for coronary artery disease risk reduction and weight management.  Calorie Density  Clinical staff conducted group or individual video education with verbal and written material and guidebook.  Patient learns about calorie density and how it impacts the Pritikin Eating Plan. Knowing the characteristics of the food you choose will help you decide whether those foods will lead to weight gain or weight loss, and whether you want to consume more or less of them. Weight loss is usually a side effect of the Pritikin Eating Plan because of its focus on low calorie-dense foods.  Label Reading  Clinical staff conducted group or individual video education with verbal and written material and guidebook.  Patient learns about the Pritikin recommended label reading guidelines and corresponding recommendations regarding calorie density, added sugars, sodium content, and whole grains.  Dining Out - Part 1  Clinical staff conducted group or individual video education with verbal and written material and guidebook.  Patient learns that restaurant meals can be sabotaging because they can be so high in calories, fat, sodium, and/or sugar. Patient learns recommended strategies on how to positively address this and avoid unhealthy pitfalls.  Facts on Fats  Clinical staff conducted group or individual video education with verbal and written material and guidebook.  Patient learns that lifestyle modifications can be just as effective, if not more so, as many medications for lowering  your risk of heart disease. A Pritikin lifestyle can help to reduce your risk of inflammation and atherosclerosis (cholesterol build-up, or plaque, in the artery walls). Lifestyle interventions such as dietary choices and physical activity address the cause of atherosclerosis. A review of the types of fats and their impact on blood cholesterol levels, along with dietary recommendations to reduce fat intake is also included.  Nutrition Action Plan  Clinical staff conducted group or individual video education with verbal and written material and guidebook.  Patient learns how to incorporate Pritikin recommendations into their lifestyle. Recommendations include planning and keeping personal health goals in mind as an important part of their success.  Healthy Mind-Set    Healthy Minds, Bodies, Hearts  Clinical staff conducted group or individual video education with verbal and written material and guidebook.  Patient learns how to identify when they are stressed. Video will discuss the impact of that stress, as well as the many benefits of stress management. Patient will also be introduced to stress management techniques. The way we think, act, and feel has an impact on our hearts.  How Our Thoughts Can Heal Our Hearts  Clinical staff conducted group or individual video education with verbal and written material and guidebook.  Patient learns that negative thoughts can cause depression and anxiety. This can result in negative lifestyle behavior and serious health problems. Cognitive behavioral therapy is an effective method to help control our thoughts in order to change and improve our emotional outlook.  Additional Videos:  Exercise    Improving Performance  Clinical staff conducted group or individual video education with verbal and written material and guidebook.  Patient learns to use a non-linear approach by alternating intensity levels and lengths of time spent exercising to help burn more  calories and lose more body fat. Cardiovascular exercise helps improve heart health, metabolism, hormonal balance, blood sugar control, and recovery from fatigue. Resistance training improves strength, endurance, balance, coordination, reaction time, metabolism, and muscle mass. Flexibility exercise improves circulation, posture, and balance. Seek guidance from your physician and exercise physiologist before implementing an exercise routine and learn your capabilities and proper form for all exercise.  Introduction to Yoga  Clinical staff conducted group or individual video education with verbal and written material and guidebook.  Patient learns about yoga, a discipline of the coming together of mind, breath, and body. The benefits of yoga include improved flexibility, improved range of motion, better posture and core strength, increased lung function, weight loss, and positive self-image. Yoga's heart health benefits include lowered blood pressure, healthier heart rate, decreased cholesterol and triglyceride levels, improved immune function, and reduced stress. Seek guidance from your physician and exercise physiologist before implementing an exercise routine and learn your capabilities and proper form for all exercise.  Medical   Aging: Enhancing Your Quality of Life  Clinical staff conducted group or individual video education with verbal and written material and guidebook.  Patient learns key strategies and recommendations to stay in good physical health and enhance quality of life, such as prevention strategies, having an advocate, securing a [place], and keeping a list of medications and system for tracking them. It also discusses how to avoid risk for bone loss.  Biology of Weight Control  Clinical staff conducted group or individual video education with verbal and written material and guidebook.  Patient learns that weight gain occurs because we consume more  calories than we burn (eating more, moving less). Even if your body weight is normal, you may have higher ratios of fat compared to muscle mass. Too much body fat puts you at increased risk for cardiovascular disease, heart attack, stroke, type 2 diabetes, and obesity-related cancers. In addition to exercise, following the [place] can help reduce your risk.  Decoding Lab Results  Clinical staff conducted group or individual video education with verbal and written material and guidebook.  Patient learns that lab test reflects one measurement whose values change over time and are influenced by many factors, including medication, stress, sleep, exercise, food, hydration, pre-existing medical conditions, and more. It is recommended to use the knowledge from this video to become more involved with your lab results and evaluate your numbers to speak with your doctor.   Diseases of Our Time - Overview  Clinical staff conducted group or individual video education with verbal and written material and guidebook.  Patient learns that according to the CDC, 50% to 70% of chronic diseases (such as obesity, type 2 diabetes, elevated lipids, hypertension, and heart disease) are avoidable through lifestyle improvements including healthier food choices, listening to satiety cues, and increased physical activity.  Sleep Disorders Clinical staff conducted group or individual video education with verbal and written material and guidebook.  Patient learns how good quality and duration of sleep are important to overall health and well-being. Patient also learns about sleep disorders and how they impact health along with recommendations to address them, including discussing with a physician.  Nutrition  Dining Out - Part 2 Clinical staff conducted group or individual  video education with verbal and written material and guidebook.  Patient learns how to plan ahead and communicate in order to maximize their  dining experience in a healthy and nutritious manner. Included are recommended food choices based on the type of restaurant the patient is visiting.   Fueling a Best boy conducted group or individual video education with verbal and written material and guidebook.  There is a strong connection between our food choices and our health. Diseases like obesity and type 2 diabetes are very prevalent and are in large-part due to lifestyle choices. The Pritikin Eating Plan provides plenty of food and hunger-curbing satisfaction. It is easy to follow, affordable, and helps reduce health risks.  Menu Workshop  Clinical staff conducted group or individual video education with verbal and written material and guidebook.  Patient learns that restaurant meals can sabotage health goals because they are often packed with calories, fat, sodium, and sugar. Recommendations include strategies to plan ahead and to communicate with the manager, chef, or server to help order a healthier meal.  Planning Your Eating Strategy  Clinical staff conducted group or individual video education with verbal and written material and guidebook.  Patient learns about the [place] and its benefit of reducing the risk of disease. The [place] does not focus on calories. Instead, it emphasizes high-quality, nutrient-rich foods. By knowing the characteristics of the foods, we choose, we can determine their calorie density and make informed decisions.  Targeting Your Nutrition Priorities  Clinical staff conducted group or individual video education with verbal and written material and guidebook.  Patient learns that lifestyle habits have a tremendous impact on disease risk and progression. This video provides eating and physical activity recommendations based on your personal health goals, such as reducing LDL cholesterol, losing weight, preventing or controlling type 2 diabetes, and reducing high  blood pressure.  Vitamins and Minerals  Clinical staff conducted group or individual video education with verbal and written material and guidebook.  Patient learns different ways to obtain key vitamins and minerals, including through a recommended healthy diet. It is important to discuss all supplements you take with your doctor.   Healthy Mind-Set    Smoking Cessation  Clinical staff conducted group or individual video education with verbal and written material and guidebook.  Patient learns that cigarette smoking and tobacco addiction pose a serious health risk which affects millions of people. Stopping smoking will significantly reduce the risk of heart disease, lung disease, and many forms of cancer. Recommended strategies for quitting are covered, including working with your doctor to develop a successful plan.  Culinary   Becoming a Financial trader conducted group or individual video education with verbal and written material and guidebook.  Patient learns that cooking at home can be healthy, cost-effective, quick, and puts them in control. Keys to cooking healthy recipes will include looking at your recipe, assessing your equipment needs, planning ahead, making it simple, choosing cost-effective seasonal ingredients, and limiting the use of added fats, salts, and sugars.  Cooking - Breakfast and Snacks  Clinical staff conducted group or individual video education with verbal and written material and guidebook.  Patient learns how important breakfast is to satiety and nutrition through the entire day. Recommendations include key foods to eat during breakfast to help stabilize blood sugar levels and to prevent overeating at meals later in the day. Planning ahead is also a key component.  Cooking - Human resources officer  Clinical staff conducted group or individual video education with verbal and written material and guidebook.  Patient learns eating strategies to improve overall  health, including an approach to cook more at home. Recommendations include thinking of animal protein as a side on your plate rather than center stage and focusing instead on lower calorie dense options like vegetables, fruits, whole grains, and plant-based proteins, such as beans. Making sauces in large quantities to freeze for later and leaving the skin on your vegetables are also recommended to maximize your experience.  Cooking - Healthy Salads and Dressing Clinical staff conducted group or individual video education with verbal and written material and guidebook.  Patient learns that vegetables, fruits, whole grains, and legumes are the foundations of the [place]. Recommendations include how to incorporate each of these in flavorful and healthy salads, and how to create homemade salad dressings. Proper handling of ingredients is also covered. Cooking - Soups and [employer] - Soups and Desserts Clinical staff conducted group or individual video education with verbal and written material and guidebook.  Patient learns that Pritikin soups and desserts make for easy, nutritious, and delicious snacks and meal components that are low in sodium, fat, sugar, and calorie density, while high in vitamins, minerals, and filling fiber. Recommendations include simple and healthy ideas for soups and desserts.   Overview     The Pritikin Solution Program Overview Clinical staff conducted group or individual video education with verbal and written material and guidebook.  Patient learns that the results of the [place] Program have been documented in more than 100 articles published in peer-reviewed journals, and the benefits include reducing risk factors for (and, in some cases, even reversing) high cholesterol, high blood pressure, type 2 diabetes, obesity, and more! An overview of the three key pillars of the Pritikin Program will be covered: eating well, doing regular exercise, and having a  healthy mind-set.  WORKSHOPS  Exercise: Exercise Basics: Building Your Action Plan Clinical staff led group instruction and group discussion with PowerPoint presentation and patient guidebook. To enhance the learning environment the use of posters, models and videos may be added. At the conclusion of this workshop, patients will comprehend the difference between physical activity and exercise, as well as the benefits of incorporating both, into their routine. Patients will understand the FITT (Frequency, Intensity, Time, and Type) principle and how to use it to build an exercise action plan. In addition, safety concerns and other considerations for exercise and cardiac rehab will be addressed by the presenter. The purpose of this lesson is to promote a comprehensive and effective weekly exercise routine in order to improve patients' overall level of fitness.   Managing Heart Disease: Your Path to a Healthier Heart Clinical staff led group instruction and group discussion with PowerPoint presentation and patient guidebook. To enhance the learning environment the use of posters, models and videos may be added.At the conclusion of this workshop, patients will understand the anatomy and physiology of the heart. Additionally, they will understand how Pritikin's three pillars impact the risk factors, the progression, and the management of heart disease.  The purpose of this lesson is to provide a high-level overview of the heart, heart disease, and how the Pritikin lifestyle positively impacts risk factors.  Exercise Biomechanics Clinical staff led group instruction and group discussion with PowerPoint presentation and patient guidebook. To enhance the learning environment the use of posters, models and videos may be added. Patients will learn how the structural parts of  their bodies function and how these functions impact their daily activities, movement, and exercise. Patients will learn how to  promote a neutral spine, learn how to manage pain, and identify ways to improve their physical movement in order to promote healthy living. The purpose of this lesson is to expose patients to common physical limitations that impact physical activity. Participants will learn practical ways to adapt and manage aches and pains, and to minimize their effect on regular exercise. Patients will learn how to maintain good posture while sitting, walking, and lifting.  Balance Training and Fall Prevention  Clinical staff led group instruction and group discussion with PowerPoint presentation and patient guidebook. To enhance the learning environment the use of posters, models and videos may be added. At the conclusion of this workshop, patients will understand the importance of their sensorimotor skills (vision, proprioception, and the vestibular system) in maintaining their ability to balance as they age. Patients will apply a variety of balancing exercises that are appropriate for their current level of function. Patients will understand the common causes for poor balance, possible solutions to these problems, and ways to modify their physical environment in order to minimize their fall risk. The purpose of this lesson is to teach patients about the importance of maintaining balance as they age and ways to minimize their risk of falling.  WORKSHOPS   Nutrition:  Fueling a Scientist, research (physical sciences) led group instruction and group discussion with PowerPoint presentation and patient guidebook. To enhance the learning environment the use of posters, models and videos may be added. Patients will review the foundational principles of the [place] and understand what constitutes a serving size in each of the food groups. Patients will also learn Pritikin-friendly foods that are better choices when away from home and review make-ahead meal and snack options. Calorie density will be reviewed and  applied to three nutrition priorities: weight maintenance, weight loss, and weight gain. The purpose of this lesson is to reinforce (in a group setting) the key concepts around what patients are recommended to eat and how to apply these guidelines when away from home by planning and selecting Pritikin-friendly options. Patients will understand how calorie density may be adjusted for different weight management goals.  Mindful Eating  Clinical staff led group instruction and group discussion with PowerPoint presentation and patient guidebook. To enhance the learning environment the use of posters, models and videos may be added. Patients will briefly review the concepts of the [place] and the importance of low-calorie dense foods. The concept of mindful eating will be introduced as well as the importance of paying attention to internal hunger signals. Triggers for non-hunger eating and techniques for dealing with triggers will be explored. The purpose of this lesson is to provide patients with the opportunity to review the basic principles of the [place], discuss the value of eating mindfully and how to measure internal cues of hunger and fullness using the Hunger Scale. Patients will also discuss reasons for non-hunger eating and learn strategies to use for controlling emotional eating.  Targeting Your Nutrition Priorities Clinical staff led group instruction and group discussion with PowerPoint presentation and patient guidebook. To enhance the learning environment the use of posters, models and videos may be added. Patients will learn how to determine their genetic susceptibility to disease by reviewing their family history. Patients will gain insight into the importance of diet as part of an overall healthy lifestyle in mitigating the impact of genetics  and other environmental insults. The purpose of this lesson is to provide patients with the opportunity to assess their personal  nutrition priorities by looking at their family history, their own health history and current risk factors. Patients will also be able to discuss ways of prioritizing and modifying the [place] for their highest risk areas  Menu  Clinical staff led group instruction and group discussion with PowerPoint presentation and patient guidebook. To enhance the learning environment the use of posters, models and videos may be added. Using menus brought in from [employer], or printed from [employer], patients will apply the [place] dining out guidelines that were presented in the [employer] video. Patients will also be able to practice these guidelines in a variety of provided scenarios. The purpose of this lesson is to provide patients with the opportunity to practice hands-on learning of the [place] with actual menus and practice scenarios.  Label Reading Clinical staff led group instruction and group discussion with PowerPoint presentation and patient guidebook. To enhance the learning environment the use of posters, models and videos may be added. Patients will review and discuss the Pritikin label reading guidelines presented in Pritikin's Label Reading Educational series video. Using fool labels brought in from local grocery stores and markets, patients will apply the label reading guidelines and determine if the packaged food meet the Pritikin guidelines. The purpose of this lesson is to provide patients with the opportunity to review, discuss, and practice hands-on learning of the Pritikin Label Reading guidelines with actual packaged food labels. [place] Workshops are designed to teach patients ways to prepare quick, simple, and affordable recipes at home. The importance of nutrition's role in chronic disease risk reduction is reflected in its emphasis in the overall Pritikin program. By learning how to prepare  essential core Pritikin Eating Plan recipes, patients will increase control over what they eat; be able to customize the flavor of foods without the use of added salt, sugar, or fat; and improve the quality of the food they consume. By learning a set of core recipes which are easily assembled, quickly prepared, and affordable, patients are more likely to prepare more healthy foods at home. These workshops focus on convenient breakfasts, simple entres, side dishes, and desserts which can be prepared with minimal effort and are consistent with nutrition recommendations for cardiovascular risk reduction. Cooking [employer] are taught by a Engineer, materials (RD) who has been trained by the [employer]. The chef or RD has a clear understanding of the importance of minimizing - if not completely eliminating - added fat, sugar, and sodium in recipes. Throughout the series of [place] Workshop sessions, patients will learn about healthy ingredients and efficient methods of cooking to build confidence in their capability to prepare    Cooking School weekly topics:  Adding Flavor- Sodium-Free  Fast and Healthy Breakfasts  Powerhouse Plant-Based Proteins  Satisfying Salads and Dressings  Simple Sides and Sauces  International Cuisine-Spotlight on the [place] Zones  Delicious Desserts  Savory Soups  Efficiency Cooking - Meals in a Snap  Tasty Appetizers and Snacks  Comforting Weekend Breakfasts  One-Pot Wonders   Fast Evening Meals  Easy [place] (Psychosocial): New Thoughts, New Behaviors Clinical staff led group instruction and group discussion with PowerPoint presentation and patient guidebook. To enhance the learning environment the use of posters, models  and videos may be added. Patients will learn and practice techniques for developing effective health and lifestyle goals. Patients will be able  to effectively apply the goal setting process learned to develop at least one new personal goal.  The purpose of this lesson is to expose patients to a new skill set of behavior modification techniques such as techniques setting SMART goals, overcoming barriers, and achieving new thoughts and new behaviors.  Managing Moods and Relationships Clinical staff led group instruction and group discussion with PowerPoint presentation and patient guidebook. To enhance the learning environment the use of posters, models and videos may be added. Patients will learn how emotional and chronic stress factors can impact their health and relationships. They will learn healthy ways to manage their moods and utilize positive coping mechanisms. In addition, ICR patients will learn ways to improve communication skills. The purpose of this lesson is to expose patients to ways of understanding how one's mood and health are intimately connected. Developing a healthy outlook can help build positive relationships and connections with others. Patients will understand the importance of utilizing effective communication skills that include actively listening and being heard. They will learn and understand the importance of the "4 Cs" and especially Connections in fostering of a Healthy Mind-Set.  Healthy Sleep for a Healthy Heart Clinical staff led group instruction and group discussion with PowerPoint presentation and patient guidebook. To enhance the learning environment the use of posters, models and videos may be added. At the conclusion of this workshop, patients will be able to demonstrate knowledge of the importance of sleep to overall health, well-being, and quality of life. They will understand the symptoms of, and treatments for, common sleep disorders. Patients will also be able to identify daytime and nighttime behaviors which impact sleep, and they will be able to apply these tools to help manage sleep-related challenges.  The purpose of this lesson is to provide patients with a general overview of sleep and outline the importance of quality sleep. Patients will learn about a few of the most common sleep disorders. Patients will also be introduced to the concept of "sleep hygiene," and discover ways to self-manage certain sleeping problems through simple daily behavior changes. Finally, the workshop will motivate patients by clarifying the links between quality sleep and their goals of heart-healthy living.   Recognizing and Reducing Stress Clinical staff led group instruction and group discussion with PowerPoint presentation and patient guidebook. To enhance the learning environment the use of posters, models and videos may be added. At the conclusion of this workshop, patients will be able to understand the types of stress reactions, differentiate between acute and chronic stress, and recognize the impact that chronic stress has on their health. They will also be able to apply different coping mechanisms, such as reframing negative self-talk. Patients will have the opportunity to practice a variety of stress management techniques, such as deep abdominal breathing, progressive muscle relaxation, and/or guided imagery.  The purpose of this lesson is to educate patients on the role of stress in their lives and to provide healthy techniques for coping with it.  Learning Barriers/Preferences:  Learning Barriers/Preferences - 05/13/22 1223       Learning Barriers/Preferences   Learning Barriers None    Learning Preferences Audio;Group Instruction;Individual Instruction;Written Material;Verbal Instruction;Skilled Demonstration;Pictoral             Education Topics:  Knowledge Questionnaire Score:  Knowledge Questionnaire Score - 05/13/22 1201       Knowledge Questionnaire Score  Pre Score 20/24             Core Components/Risk Factors/Patient Goals at Admission:  Personal Goals and Risk Factors at  Admission - 05/13/22 1141       Core Components/Risk Factors/Patient Goals on Admission    Weight Management Weight Loss;Yes    Intervention Weight Management: Develop a combined nutrition and exercise program designed to reach desired caloric intake, while maintaining appropriate intake of nutrient and fiber, sodium and fats, and appropriate energy expenditure required for the weight goal.;Weight Management: Provide education and appropriate resources to help participant work on and attain dietary goals.    Admit Weight 119 lb 4.3 oz (54.1 kg)    Goal Weight: Short Term 111 lb (50.3 kg)    Goal Weight: Long Term 103 lb (46.7 kg)    Expected Outcomes Short Term: Continue to assess and modify interventions until short term weight is achieved;Long Term: Adherence to nutrition and physical activity/exercise program aimed toward attainment of established weight goal;Weight Loss: Understanding of general recommendations for a balanced deficit meal plan, which promotes 1-2 lb weight loss per week and includes a negative energy balance of [phone] kcal/d    Lipids Yes    Intervention Provide education and support for participant on nutrition & aerobic/resistive exercise along with prescribed medications to achieve LDL '70mg'$ , HDL >'40mg'$ .    Expected Outcomes Short Term: Participant states understanding of desired cholesterol values and is compliant with medications prescribed. Participant is following exercise prescription and nutrition guidelines.;Long Term: Cholesterol controlled with medications as prescribed, with individualized exercise RX and with personalized nutrition plan. Value goals: LDL < '70mg'$ , HDL > 40 mg.    Personal Goal Other Yes    Personal Goal Patient would like to start yoga.    Intervention Will show patient Pritikin Introduction to Yoga video.    Expected Outcomes Patient will be able to participate in Yoga.             Core Components/Risk Factors/Patient Goals Review:     Core Components/Risk Factors/Patient Goals at Discharge (Final Review):    ITP Comments:  ITP Comments     Row Name 05/13/22 0942           ITP Comments Medical Director- Dr. [name], MD, Introduction to Pritikin Education Program/ Intensive Cardiac Rehab. Initial Orientation Packet Reviewed with the patient                Comments: Participant attended orientation for the cardiac rehabilitation program on  05/13/2022  to perform initial intake and exercise walk test. Patient introduced to the [facility] education and orientation packet was reviewed. Completed 6-minute walk test, measurements, initial ITP, and exercise prescription. Vital signs stable. Telemetry rhythm-V-paced, asymptomatic.   Service time was from 942 to 1151.

## 2022-05-19 ENCOUNTER — Ambulatory Visit: Payer: Medicare Other | Attending: Cardiology

## 2022-05-19 ENCOUNTER — Ambulatory Visit (HOSPITAL_COMMUNITY): Payer: Medicare Other

## 2022-05-19 ENCOUNTER — Telehealth (HOSPITAL_COMMUNITY): Payer: Self-pay | Admitting: *Deleted

## 2022-05-19 DIAGNOSIS — I480 Paroxysmal atrial fibrillation: Secondary | ICD-10-CM

## 2022-05-19 NOTE — Telephone Encounter (Signed)
Patient did not attend cardiac rehab today. Unable to leave message. Called patient's sister Ronette who said she will call to check on her.Harrell Gave RN BSN

## 2022-05-19 NOTE — Telephone Encounter (Signed)
Elaine Le called to report she is experiences temperature variations and nausea. Jackelyn Poling says that she will follow up with the cardiologist as she thinks she is having a reaction to one of her medications. Elaine Le did not exercise today.Harrell Gave RN BSN

## 2022-05-21 ENCOUNTER — Ambulatory Visit (HOSPITAL_COMMUNITY): Payer: Medicare Other

## 2022-05-21 ENCOUNTER — Ambulatory Visit (HOSPITAL_COMMUNITY)
Admission: RE | Admit: 2022-05-21 | Discharge: 2022-05-21 | Disposition: A | Discharge status: Home / Self Care | Payer: Medicare Other | Source: Ambulatory Visit | Attending: Neurosurgery | Admitting: Neurosurgery

## 2022-05-21 DIAGNOSIS — M5412 Radiculopathy, cervical region: Secondary | ICD-10-CM | POA: Insufficient documentation

## 2022-05-21 LAB — CUP PACEART REMOTE DEVICE CHECK
Date Time Interrogation Session: 20240123105353
Implantable Lead Connection Status: 753985
Implantable Lead Connection Status: 753985
Implantable Lead Implant Date: 20231023
Implantable Lead Implant Date: 20231023
Implantable Lead Location: 753859
Implantable Lead Location: 753860
Implantable Lead Model: 377
Implantable Lead Model: 377
Implantable Lead Serial Number: 8001105850
Implantable Lead Serial Number: 8001122371
Implantable Pulse Generator Implant Date: 20231023
Pulse Gen Model: 407145
Pulse Gen Serial Number: 1000084874

## 2022-05-21 NOTE — Progress Notes (Signed)
Patient here today at [facility] for MR Cervical spine wo contrast. Patient has Biotronik device followed by [facility]. Brent-rep to the bedside to program patient. DOO 90, Auto-detect on.

## 2022-05-22 DIAGNOSIS — I480 Paroxysmal atrial fibrillation: Secondary | ICD-10-CM | POA: Insufficient documentation

## 2022-05-22 NOTE — Progress Notes (Signed)
Cardiology Office Note   Date:  05/23/2022   ID:  Elaine Le, DOB [birth year], MRN [id number]  PCP:  Leeroy Cha, MD  Cardiologist:   Minus Breeding, MD   Chief Complaint  Patient presents with   Edema   Shortness of Breath      History of Present Illness: Elaine Le is a 62 y.o. female who presents for follow-up after mitral valve replacement. She had probable rheumatic disease with severe regurgitation and moderate stenosis and is status post minimally invasive valve replacement.   TEE in Feb 2023 suggested moderate MR with moderate stenosis.  She had an TTE last week.   The mitral valve has severe regurgitation with a 25 mm Edwards Magna bioprosthetic valve.  There was severe MR.  The EF is 65%.   She is now status redo mitral valve replacement.  She had CHB and required pacemaker placement.    She had post op atrial fib.  She had adhesions from her previous surgery.  She had difficult anatomy for adequate exposure.  She did have a 25 mm pericardial Maitra valve placed.  Her left atrial appendage was oversewn.  TEE after coming off bypass demonstrated central regurgitation of the leaflets.  Patient was placed back on bypass.  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  She had this time of Mosaic valve placed.  She returns for follow up.  She continues to have issues that she thinks are related to volume retention.  She feels swollen in her belly.  She has some ankle edema.  She takes the Lasix as needed.  She has gained about 4 pounds.  She just overall does not feel well.  She is not describing PND or orthopnea.  She is not describing new palpitations, presyncope or syncope.  She has had no fevers or chills.   Past Medical History:  Diagnosis Date   Acute respiratory failure with hypercapnia ([place]) 79/39/0300   Acute systolic heart failure ([place]) 09/2014   Adenomatous colon polyp    Arthritis    Back pain    Chronic  diastolic heart failure (HCC)    related to MR/MS   Chronic pain syndrome    Chronic sinusitis    COPD (chronic obstructive pulmonary disease)-PFT pending  07/17/2014   Diverticulosis    Dyspnea    Fibromyalgia    GERD (gastroesophageal reflux disease)    Headaches, cluster    Heart murmur    Hiatal hernia    Hx of adenomatous colonic polyps 03/10/2011   Oct 2012, repeat colon Oct 2017    Hypothyroidism    Internal hemorrhoids    Irritable bowel syndrome (IBS)    Mitral valve regurgitation    severe   Mitral valve stenosis, moderate 07/11/2014   Pneumonia    Feb. 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   Severe mitral regurgitation with moderate mitral stenosis   S/P minimally invasive mitral valve replacement with bioprosthetic valve 11/08/2014   25 mm [facility] Mitral bovine bioprosthetic tissue valve placed via right mini thoracotomy approach   Sleep apnea    uses CPAP-setting 7   Tobacco abuse     Past Surgical History:  Procedure Laterality Date   CARDIAC CATHETERIZATION     CYSTOSTOMY W/ BLADDER DILATION     at age 31   LEFT AND RIGHT HEART CATHETERIZATION WITH CORONARY ANGIOGRAM N/A 07/27/2014  Procedure: LEFT AND RIGHT HEART CATHETERIZATION WITH CORONARY ANGIOGRAM;  Surgeon: Burnell Blanks, MD;  Location: [facility] CATH LAB;  Right left heart catheterization: Mild/minimal coronary artery disease. RV: 50/7/17, PA: 48/29 (mean 38), PCWP 33 mmHg   MITRAL VALVE REPAIR Right 11/08/2014   Procedure: MINIMALLY INVASIVE MITRAL VALVE REPLACEMENT(MVR);  Surgeon: Rexene Alberts, MD;  Location: [place];  Service: Open Heart Surgery;  Laterality: Right;   MITRAL VALVE REPLACEMENT N/A 02/11/2022   Procedure: REDO MITRAL VALVE REPLACEMENT (MVR) VIA STERNOTOMY USING 27 MM MOSAIC PORCINE MITRAL VALVE;  Surgeon: [name], MD;  Location: [place];  Service: Open Heart Surgery;  Laterality: N/A;   NEUROPLASTY  / TRANSPOSITION MEDIAN NERVE AT CARPAL TUNNEL BILATERAL     PACEMAKER IMPLANT N/A 02/17/2022   Procedure: PACEMAKER IMPLANT;  Surgeon: Vickie Epley, MD;  Location: [place] CV LAB;  Service: Cardiovascular;  Laterality: N/A;   RIGHT/LEFT HEART CATH AND CORONARY ANGIOGRAPHY N/A 12/26/2021   Procedure: RIGHT/LEFT HEART CATH AND CORONARY ANGIOGRAPHY;  Surgeon: Sherren Mocha, MD;  Location: [place] CV LAB;  Service: Cardiovascular;  Laterality: N/A;   TEE WITHOUT CARDIOVERSION N/A 07/10/2014   Procedure: TRANSESOPHAGEAL ECHOCARDIOGRAM (TEE);  Surgeon: Lelon Perla, MD;  Location: [facility] ENDOSCOPY;  Service: Cardiovascular;  55-60%. No regional wall motion modalities. Moderate mitral stenosis with severe regurgitation and moderate to severely dilated left atrium.   TEE WITHOUT CARDIOVERSION N/A 11/08/2014   Procedure: TRANSESOPHAGEAL ECHOCARDIOGRAM (TEE);  Surgeon: Rexene Alberts, MD;  Location: [place];  Service: Open Heart Surgery;  Laterality: N/A;   TEE WITHOUT CARDIOVERSION N/A 06/07/2021   Procedure: TRANSESOPHAGEAL ECHOCARDIOGRAM (TEE);  Surgeon: Sueanne Margarita, MD;  Location: [facility] ENDOSCOPY;  Service: Cardiovascular;  Laterality: N/A;   TEE WITHOUT CARDIOVERSION N/A 02/11/2022   Procedure: TRANSESOPHAGEAL ECHOCARDIOGRAM (TEE);  Surgeon: [name], MD;  Location: [place];  Service: Open Heart Surgery;  Laterality: N/A;   THORACIC OUTLET SURGERY     TRANSTHORACIC ECHOCARDIOGRAM  07/04/2014   Normal LV Size/Fnx - EF 60-65% no RWMA; MV: thickened leaflets with doming, fixed Posterior leaflet, anterior leaflet w/ large/shaggy & mobile density.  c/w Rheumatic Mitral disease - moderate MS & Mod-Severe MR.   TUBAL LIGATION     VULVA SURGERY       Current Outpatient Medications  Medication Sig Dispense Refill   apixaban (ELIQUIS) 5 MG TABS tablet Take 1 tablet (5 mg total) by mouth 2 (two) times daily. 60 tablet 3   clobetasol (TEMOVATE) 0.05 % external solution Apply 1 application   topically daily as needed (scalp irritation.).     estradiol (ESTRACE) 0.1 MG/GM vaginal cream 1 gram vaginally twice weekly 42.5 g 6   hydrocortisone cream 1 % Apply 1 Application topically as needed for itching.     ketoconazole (NIZORAL) 2 % shampoo Apply 1 Application topically daily as needed (scalp irritation.).     liothyronine (CYTOMEL) 5 MCG tablet Take 5 mcg by mouth daily.   2   metoprolol tartrate (LOPRESSOR) 25 MG tablet Take 0.5 tablets (12.5 mg total) by mouth 2 (two) times daily. 60 tablet 3   metroNIDAZOLE (METROCREAM) 0.75 % cream Apply 1 application  topically in the morning. Face     potassium chloride SA (KLOR-CON M) 20 MEQ tablet Take 1 tablet (20 mEq total) by mouth daily as needed. 30 tablet 3   SYNTHROID 75 MCG tablet Take 75 mcg by mouth daily before breakfast.      terconazole (TERAZOL 3) 0.8 % vaginal  cream Place 1 applicator vaginally at bedtime. Use for 3 days. 20 g 1   furosemide (LASIX) 20 MG tablet Take 1 tablet (20 mg total) by mouth daily. For weight gain of 3 lbs in 24 or 5 lbs in 48 hours 90 tablet 3   No current facility-administered medications for this visit.    Allergies:   Estrace [estradiol], Reglan [metoclopramide], Warfarin and related, Other, Morphine and related, Singulair [montelukast sodium], and Statins    ROS:  Please see the history of present illness.   Otherwise, review of systems are positive for none joint pains and somnolence. All other systems are reviewed and negative.    PHYSICAL EXAM: VS:  BP 130/70 (BP Location: Left Arm, Patient Position: Sitting, Cuff Size: Normal)   Pulse 78   Ht 5' (1.524 m)   Wt 120 lb (54.4 kg)   BMI 23.44 kg/m  , BMI Body mass index is 23.44 kg/m.  GENERAL:  Well appearing NECK:  No jugular venous distention, waveform within normal limits, carotid upstroke brisk and symmetric, no bruits, no thyromegaly LUNGS:  Clear to auscultation bilaterally CHEST:  Well healed surgical and pacemaker pocket  scar HEART:  PMI not displaced or sustained,S1 and S2 within normal limits, no S3, no S4, no clicks, no rubs, 3 out of 6 systolic murmur radiating slightly at the aortic outflow tract and anterior precordium, positive diastolic murmur heard best at the third left intercostal space murmurs ABD:  Flat, positive bowel sounds normal in frequency in pitch, no bruits, no rebound, no guarding, no midline pulsatile mass, no hepatomegaly, no splenomegaly EXT:  2 plus pulses throughout, mild diffuse nonpitting edema, no cyanosis no clubbing   EKG:  EKG is not ordered today.   Recent Labs: 11/09/2014: Magnesium 2.1 01/10/2015: ALT 27; BUN 9; Creatinine, Ser 0.70; Potassium 4.3; Sodium 139 02/01/2015: Hemoglobin 15.1*; Platelets 292.0 03/29/2015: TSH 4.03    Lipid Panel    Component Value Date/Time   CHOL 256 (H) 06/26/2020 1124   TRIG 50.0 06/26/2020 1124   HDL 73.50 06/26/2020 1124   CHOLHDL 3 06/26/2020 1124   VLDL 10.0 06/26/2020 1124   LDLCALC 172 (H) 06/26/2020 1124      Wt Readings from Last 3 Encounters:  05/23/22 120 lb (54.4 kg)  05/13/22 119 lb 4.3 oz (54.1 kg)  03/17/22 111 lb (50.3 kg)      Other studies Reviewed: Additional studies/ records that were reviewed today include: Labs  ASSESSMENT AND PLAN:  MV REPLACEMENT:      I am going to check an echocardiogram given the significant murmur she is having.  I did a bedside echo previously at the last visit she had a slight gradient across her valve.  She is going to be taking her Lasix 20 mg every day.  She wants to come off potassium and I think it is okay and I will check a basic metabolic profile in 10 days.  I will check a TSH.  She wants to stop other medications and I think we can stop the metoprolol.   DYSLIPIDEMIA:    She has been intolerant of statins.  No change in therapy.  PPM:   I looked at her most recent interrogation and it was fine in January without any evidence of atrial fibrillation.  See below.   ATRIAL  FIB: She is on anticoagulation.   However, she would like to come off of this.  She has had no fibrillation.  I might stop this at the  next visit.   SLEEP APNEA:    She continues to use CPAP.   Current medicines are reviewed at length with the patient today.  The patient does not have concerns regarding medicines.  The following changes have been made: None  Labs/ tests ordered today include:   None   Disposition:   FU with me in late February.    Signed, Minus Breeding, MD  05/23/2022 1:44 PM    [place]

## 2022-05-23 ENCOUNTER — Encounter: Payer: Self-pay | Admitting: Cardiology

## 2022-05-23 ENCOUNTER — Ambulatory Visit (INDEPENDENT_AMBULATORY_CARE_PROVIDER_SITE_OTHER): Payer: Medicare Other | Admitting: Cardiology

## 2022-05-23 ENCOUNTER — Ambulatory Visit: Payer: Medicare Other | Attending: Cardiology | Admitting: Cardiology

## 2022-05-23 ENCOUNTER — Ambulatory Visit (HOSPITAL_COMMUNITY): Payer: Medicare Other

## 2022-05-23 VITALS — BP 130/70 | HR 78 | Ht 60.0 in | Wt 120.0 lb

## 2022-05-23 VITALS — BP 130/70 | HR 80 | Ht 60.0 in | Wt 119.0 lb

## 2022-05-23 DIAGNOSIS — I442 Atrioventricular block, complete: Secondary | ICD-10-CM | POA: Insufficient documentation

## 2022-05-23 DIAGNOSIS — G4730 Sleep apnea, unspecified: Secondary | ICD-10-CM | POA: Insufficient documentation

## 2022-05-23 DIAGNOSIS — E785 Hyperlipidemia, unspecified: Secondary | ICD-10-CM | POA: Insufficient documentation

## 2022-05-23 DIAGNOSIS — Z952 Presence of prosthetic heart valve: Secondary | ICD-10-CM | POA: Insufficient documentation

## 2022-05-23 DIAGNOSIS — Z79899 Other long term (current) drug therapy: Secondary | ICD-10-CM | POA: Insufficient documentation

## 2022-05-23 DIAGNOSIS — E559 Vitamin D deficiency, unspecified: Secondary | ICD-10-CM

## 2022-05-23 DIAGNOSIS — I340 Nonrheumatic mitral (valve) insufficiency: Secondary | ICD-10-CM | POA: Diagnosis not present

## 2022-05-23 DIAGNOSIS — Z950 Presence of cardiac pacemaker: Secondary | ICD-10-CM | POA: Insufficient documentation

## 2022-05-23 DIAGNOSIS — I480 Paroxysmal atrial fibrillation: Secondary | ICD-10-CM

## 2022-05-23 LAB — CUP PACEART INCLINIC DEVICE CHECK
Brady Statistic RA Percent Paced: 1 %
Brady Statistic RV Percent Paced: 99 %
Date Time Interrogation Session: 20240126152621
Implantable Lead Connection Status: 753985
Implantable Lead Connection Status: 753985
Implantable Lead Implant Date: 20231023
Implantable Lead Implant Date: 20231023
Implantable Lead Location: 753859
Implantable Lead Location: 753860
Implantable Lead Model: 377
Implantable Lead Model: 377
Implantable Lead Serial Number: 8001105850
Implantable Lead Serial Number: 8001122371
Implantable Pulse Generator Implant Date: 20231023
Lead Channel Impedance Value: 487 Ohm
Lead Channel Impedance Value: 565 Ohm
Lead Channel Pacing Threshold Amplitude: 0.9 V
Lead Channel Pacing Threshold Amplitude: 1 V
Lead Channel Pacing Threshold Pulse Width: 0.4 ms
Lead Channel Pacing Threshold Pulse Width: 0.4 ms
Lead Channel Sensing Intrinsic Amplitude: 5.2 mV
Pulse Gen Model: 407145
Pulse Gen Serial Number: 1000084874

## 2022-05-23 MED ORDER — FUROSEMIDE 20 MG PO TABS: 20.0000 mg | ORAL_TABLET | Freq: Every day | ORAL | 3 refills | Status: DC

## 2022-05-23 NOTE — Progress Notes (Deleted)
Electrophysiology Office Follow up Visit Note:    Date:  05/23/2022   ID:  Ethelda Chick, DOB [birth year], MRN [id number]  PCP:  Leeroy Cha, MD  [facility] Cardiologist:  Minus Breeding, MD  [facility] Electrophysiologist:  Vickie Epley, MD    Interval History:    Elaine Le is a 62 y.o. female who presents for a follow up visit.  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  She has done well after implant.      Past Medical History:  Diagnosis Date   Acute respiratory failure with hypercapnia ([place]) 40/98/1191   Acute systolic heart failure ([place]) 09/2014   Adenomatous colon polyp    Arthritis    Back pain    Chronic diastolic heart failure (HCC)    related to MR/MS   Chronic pain syndrome    Chronic sinusitis    COPD (chronic obstructive pulmonary disease)-PFT pending  07/17/2014   Diverticulosis    Dyspnea    Fibromyalgia    GERD (gastroesophageal reflux disease)    Headaches, cluster    Heart murmur    Hiatal hernia    Hx of adenomatous colonic polyps 03/10/2011   Oct 2012, repeat colon Oct 2017    Hypothyroidism    Internal hemorrhoids    Irritable bowel syndrome (IBS)    Mitral valve regurgitation    severe   Mitral valve stenosis, moderate 07/11/2014   Pneumonia    Feb. 24, 2016   Protein-calorie malnutrition, severe ([place]) 07/06/2014   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/10/2014   Severe mitral regurgitation with moderate mitral stenosis   S/P minimally invasive mitral valve replacement with bioprosthetic valve 11/08/2014   25 mm [facility] Mitral bovine bioprosthetic tissue valve placed via right mini thoracotomy approach   Sleep apnea    uses CPAP-setting 7   Tobacco abuse     Past Surgical History:  Procedure Laterality Date   CARDIAC CATHETERIZATION     CYSTOSTOMY W/ BLADDER DILATION     at age 28   LEFT AND RIGHT HEART CATHETERIZATION  WITH CORONARY ANGIOGRAM N/A 07/27/2014   Procedure: LEFT AND RIGHT HEART CATHETERIZATION WITH CORONARY ANGIOGRAM;  Surgeon: Burnell Blanks, MD;  Location: [facility] CATH LAB;  Right left heart catheterization: Mild/minimal coronary artery disease. RV: 50/7/17, PA: 48/29 (mean 38), PCWP 33 mmHg   MITRAL VALVE REPAIR Right 11/08/2014   Procedure: MINIMALLY INVASIVE MITRAL VALVE REPLACEMENT(MVR);  Surgeon: Rexene Alberts, MD;  Location: [place];  Service: Open Heart Surgery;  Laterality: Right;   MITRAL VALVE REPLACEMENT N/A 02/11/2022   Procedure: REDO MITRAL VALVE REPLACEMENT (MVR) VIA STERNOTOMY USING 27 MM MOSAIC PORCINE MITRAL VALVE;  Surgeon: [name], MD;  Location: [place];  Service: Open Heart Surgery;  Laterality: N/A;   NEUROPLASTY / TRANSPOSITION MEDIAN NERVE AT CARPAL TUNNEL BILATERAL     PACEMAKER IMPLANT N/A 02/17/2022   Procedure: PACEMAKER IMPLANT;  Surgeon: Vickie Epley, MD;  Location: [place] CV LAB;  Service: Cardiovascular;  Laterality: N/A;   RIGHT/LEFT HEART CATH AND CORONARY ANGIOGRAPHY N/A 12/26/2021   Procedure: RIGHT/LEFT HEART CATH AND CORONARY ANGIOGRAPHY;  Surgeon: Sherren Mocha, MD;  Location: [place] CV LAB;  Service: Cardiovascular;  Laterality: N/A;   TEE WITHOUT CARDIOVERSION N/A 07/10/2014   Procedure: TRANSESOPHAGEAL ECHOCARDIOGRAM (TEE);  Surgeon: Lelon Perla, MD;  Location[facility] ENDOSCOPY;  Service: Cardiovascular;  55-60%. No regional wall motion modalities. Moderate mitral  stenosis with severe regurgitation and moderate to severely dilated left atrium.   TEE WITHOUT CARDIOVERSION N/A 11/08/2014   Procedure: TRANSESOPHAGEAL ECHOCARDIOGRAM (TEE);  Surgeon: Rexene Alberts, MD;  Location: [place];  Service: Open Heart Surgery;  Laterality: N/A;   TEE WITHOUT CARDIOVERSION N/A 06/07/2021   Procedure: TRANSESOPHAGEAL ECHOCARDIOGRAM (TEE);  Surgeon: Sueanne Margarita, MD;  Location: [facility] ENDOSCOPY;  Service: Cardiovascular;  Laterality: N/A;   TEE  WITHOUT CARDIOVERSION N/A 02/11/2022   Procedure: TRANSESOPHAGEAL ECHOCARDIOGRAM (TEE);  Surgeon: [name], MD;  Location: [place];  Service: Open Heart Surgery;  Laterality: N/A;   THORACIC OUTLET SURGERY     TRANSTHORACIC ECHOCARDIOGRAM  07/04/2014   Normal LV Size/Fnx - EF 60-65% no RWMA; MV: thickened leaflets with doming, fixed Posterior leaflet, anterior leaflet w/ large/shaggy & mobile density.  c/w Rheumatic Mitral disease - moderate MS & Mod-Severe MR.   TUBAL LIGATION     VULVA SURGERY      Current Medications: No outpatient medications have been marked as taking for the 05/23/22 encounter (Appointment) with Vickie Epley, MD.     Allergies:   Estrace [estradiol], Reglan [metoclopramide], Warfarin and related, Other, Morphine and related, Singulair [montelukast sodium], and Statins   Social History   Socioeconomic History   Marital status: Single    Spouse name: Not on file   Number of children: 1   Years of education: 12   Highest education level: Not on file  Occupational History   Occupation: disabled  Tobacco Use   Smoking status: Former    Packs/day: 0.50    Years: 35.00    Total pack years: 17.50    Types: Cigarettes    Quit date: 06/17/2014    Years since quitting: 7.9   Smokeless tobacco: Never  Vaping Use   Vaping Use: Never used  Substance and Sexual Activity   Alcohol use: No   Drug use: No   Sexual activity: Not Currently  Other Topics Concern   Not on file  Social History Narrative   Not on file   Social Determinants of Health   Financial Resource Strain: Not on file  Food Insecurity: No Food Insecurity (02/19/2022)   Hunger Vital Sign    Worried About Running Out of Food in the Last Year: Never true    Ran Out of Food in the Last Year: Never true  Transportation Needs: No Transportation Needs (02/19/2022)   PRAPARE - Hydrologist (Medical): No    Lack of Transportation (Non-Medical): No  Physical  Activity: Not on file  Stress: Not on file  Social Connections: Not on file     Family History: The patient's family history includes Breast cancer in her paternal aunt; Colon polyps in her mother; Diabetes in her brother and sister; Irritable bowel syndrome in her daughter and mother; Kidney cancer in her mother; Liver disease in her sister. There is no history of Colon cancer or Thyroid disease.  ROS:   Please see the history of present illness.    All other systems reviewed and are negative.  EKGs/Labs/Other Studies Reviewed:    The following studies were reviewed today:  May 23, 2022 in clinic device interrogation personally reviewed ***  EKG:  The ekg ordered today demonstrates ***  Recent Labs: 12/23/2021: BNP 181.3 02/11/2022: TSH 0.835 02/14/2022: ALT 14 02/16/2022: Magnesium 2.1 02/20/2022: Hemoglobin 8.3; Platelets 312 02/21/2022: BUN 6; Creatinine, Ser 0.65; Potassium 3.6; Sodium 135  Recent Lipid Panel  Component Value Date/Time   CHOL 256 (H) 06/26/2020 1124   TRIG 50.0 06/26/2020 1124   HDL 73.50 06/26/2020 1124   CHOLHDL 3 06/26/2020 1124   VLDL 10.0 06/26/2020 1124   LDLCALC 172 (H) 06/26/2020 1124    Physical Exam:    VS:  There were no vitals taken for this visit.    Wt Readings from Last 3 Encounters:  05/13/22 119 lb 4.3 oz (54.1 kg)  03/17/22 111 lb (50.3 kg)  03/10/22 118 lb (53.5 kg)     GEN: *** Well nourished, well developed in no acute distress CARDIAC: ***RRR, no murmurs, rubs, gallops.  Pacemaker pocket well-healed RESPIRATORY:  Clear to auscultation without rales, wheezing or rhonchi        ASSESSMENT:    1. Cardiac pacemaker in situ   2. Heart block AV complete ([place])   3. Severe mitral regurgitation   4. S/P MVR (mitral valve replacement)    PLAN:    In order of problems listed above:  #Complete heart block #Permanent pacemaker in situ Device functioning appropriately.  Continue remote monitoring.  #Severe  MR #Post mitral valve replacement Doing well after her surgery.  Follow-up with APP in 1 year.  Medication Adjustments/Labs and Tests Ordered: Current medicines are reviewed at length with the patient today.  Concerns regarding medicines are outlined above.  No orders of the defined types were placed in this encounter.  No orders of the defined types were placed in this encounter.    Signed, Lars Mage, MD, [facility], [facility] 05/23/2022 5:26 AM    Electrophysiology [facility]

## 2022-05-23 NOTE — Patient Instructions (Addendum)
Medication Instructions:  Your physician has recommended you make the following change in your medication:  STOP: Metoprolol  STOP: Potassium  INCREASE: Lasix '20mg'$  daily.   *If you need a refill on your cardiac medications before your next appointment, please call your pharmacy*   Lab Work: Your physician recommends that you return in [place] to have the following labs drawn: BMP,TSH, Vitamin D Level and Magnesium   If you have labs (blood work) drawn today and your tests are completely normal, you will receive your results only by: [place] (if you have MyChart) OR A paper copy in the mail If you have any lab test that is abnormal or we need to change your treatment, we will call you to review the results.   Testing/Procedures: Your physician has requested that you have an echocardiogram. Echocardiography is a painless test that uses sound waves to create images of your heart. It provides your doctor with information about the size and shape of your heart and how well your heart's chambers and valves are working. This procedure takes approximately one hour. There are no restrictions for this procedure. Please do NOT wear cologne, perfume, aftershave, or lotions (deodorant is allowed). Please arrive 15 minutes prior to your appointment time.    Follow-Up: At [facility], you and your health needs are our priority.  As part of our continuing mission to provide you with exceptional heart care, we have created designated Provider Care Teams.  These Care Teams include your primary Cardiologist (physician) and Advanced Practice Providers (APPs -  Physician Assistants and Nurse Practitioners) who all work together to provide you with the care you need, when you need it.  We recommend signing up for the patient portal called "MyChart".  Sign up information is provided on this After Visit Summary.  MyChart is used to connect with patients for Virtual Visits (Telemedicine).   Patients are able to view lab/test results, encounter notes, upcoming appointments, etc.  Non-urgent messages can be sent to your provider as well.   To learn more about what you can do with MyChart, go to [email or web address].    Your next appointment:   1 month(s) Ending of February.   Provider:   Minus Breeding, MD

## 2022-05-23 NOTE — Patient Instructions (Addendum)
Medication Instructions:  Your physician recommends that you continue on your current medications as directed. Please refer to the Current Medication list given to you today.  *If you need a refill on your cardiac medications before your next appointment, please call your pharmacy*  Follow-Up: At [facility], you and your health needs are our priority.  As part of our continuing mission to provide you with exceptional heart care, we have created designated Provider Care Teams.  These Care Teams include your primary Cardiologist (physician) and Advanced Practice Providers (APPs -  Physician Assistants and Nurse Practitioners) who all work together to provide you with the care you need, when you need it.  Your next appointment:   1 year(s)  Provider:   You will see one of the following Advanced Practice Providers on your designated Care Team:   Tommye Standard, [place] "Jonni Sanger" Chalmers Cater, [place] Mamie Levers, NP  Other [facility] phone number: [phone]

## 2022-05-23 NOTE — Progress Notes (Signed)
Electrophysiology Office Follow up Visit Note:    Date:  05/23/2022   ID:  Elaine Le, DOB May 08, 1960, MRN [id number]  PCP:  Leeroy Cha, MD  [facility] Cardiologist:  Minus Breeding, MD  [facility] Electrophysiologist:  Vickie Epley, MD    Interval History:    Elaine Le is a 62 y.o. female who presents for a follow up visit.  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  She has done well after implant.  Today, she became a little lightheaded during the device interrogation. She notes that she had nothing today aside from a cup of coffee. She was given water and a snack and is feeling much better.  On Wednesday she had pain associated with her PPM during an MRI; she felt like it was being tugged. Currently she complains of some minor sensitivity/tenderness near her PPM pocket.  She denies any palpitations, shortness of breath, or peripheral edema. No lightheadedness, headaches, syncope, orthopnea, or PND.     Past Medical History:  Diagnosis Date   Acute respiratory failure with hypercapnia ([place]) 86/57/8469   Acute systolic heart failure ([place]) 09/2014   Adenomatous colon polyp    Arthritis    Back pain    Chronic diastolic heart failure (HCC)    related to MR/MS   Chronic pain syndrome    Chronic sinusitis    COPD (chronic obstructive pulmonary disease)-PFT pending  07/17/2014   Diverticulosis    Dyspnea    Fibromyalgia    GERD (gastroesophageal reflux disease)    Headaches, cluster    Heart murmur    Hiatal hernia    Hx of adenomatous colonic polyps 03/10/2011   Oct 2012, repeat colon Oct 2017    Hypothyroidism    Internal hemorrhoids    Irritable bowel syndrome (IBS)    Mitral valve regurgitation    severe   Mitral valve stenosis, moderate 07/11/2014   Pneumonia    Feb. 24, 2016   Protein-calorie malnutrition, severe ([place]) 07/06/2014   Rheumatic  disease of mitral valve 07/10/2014   Severe mitral regurgitation with moderate mitral stenosis   S/P minimally invasive mitral valve replacement with bioprosthetic valve 11/08/2014   25 mm [facility] Mitral bovine bioprosthetic tissue valve placed via right mini thoracotomy approach   Sleep apnea    uses CPAP-setting 7   Tobacco abuse     Past Surgical History:  Procedure Laterality Date   CARDIAC CATHETERIZATION     CYSTOSTOMY W/ BLADDER DILATION     at age 22   LEFT AND RIGHT HEART CATHETERIZATION WITH CORONARY ANGIOGRAM N/A 07/27/2014   Procedure: LEFT AND RIGHT HEART CATHETERIZATION WITH CORONARY ANGIOGRAM;  Surgeon: Burnell Blanks, MD;  Location: [facility] CATH LAB;  Right left heart catheterization: Mild/minimal coronary artery disease. RV: 50/7/17, PA: 48/29 (mean 38), PCWP 33 mmHg   MITRAL VALVE REPAIR Right 11/08/2014   Procedure: MINIMALLY INVASIVE MITRAL VALVE REPLACEMENT(MVR);  Surgeon: Rexene Alberts, MD;  Location: [place];  Service: Open Heart Surgery;  Laterality: Right;   MITRAL VALVE REPLACEMENT N/A 02/11/2022   Procedure: REDO MITRAL VALVE REPLACEMENT (MVR) VIA STERNOTOMY USING 27 MM MOSAIC PORCINE MITRAL VALVE;  Surgeon: [name], MD;  Location: [place];  Service: Open Heart Surgery;  Laterality: N/A;   NEUROPLASTY / TRANSPOSITION MEDIAN NERVE AT CARPAL TUNNEL BILATERAL     PACEMAKER IMPLANT N/A 02/17/2022   Procedure: PACEMAKER IMPLANT;  Surgeon: Vickie Epley,  MD;  Location: [place] CV LAB;  Service: Cardiovascular;  Laterality: N/A;   RIGHT/LEFT HEART CATH AND CORONARY ANGIOGRAPHY N/A 12/26/2021   Procedure: RIGHT/LEFT HEART CATH AND CORONARY ANGIOGRAPHY;  Surgeon: Sherren Mocha, MD;  Location: [place] CV LAB;  Service: Cardiovascular;  Laterality: N/A;   TEE WITHOUT CARDIOVERSION N/A 07/10/2014   Procedure: TRANSESOPHAGEAL ECHOCARDIOGRAM (TEE);  Surgeon: Lelon Perla, MD;  Location[facility] ENDOSCOPY;  Service: Cardiovascular;  55-60%. No regional  wall motion modalities. Moderate mitral stenosis with severe regurgitation and moderate to severely dilated left atrium.   TEE WITHOUT CARDIOVERSION N/A 11/08/2014   Procedure: TRANSESOPHAGEAL ECHOCARDIOGRAM (TEE);  Surgeon: Rexene Alberts, MD;  Location: [place];  Service: Open Heart Surgery;  Laterality: N/A;   TEE WITHOUT CARDIOVERSION N/A 06/07/2021   Procedure: TRANSESOPHAGEAL ECHOCARDIOGRAM (TEE);  Surgeon: Sueanne Margarita, MD;  Location[facility] ENDOSCOPY;  Service: Cardiovascular;  Laterality: N/A;   TEE WITHOUT CARDIOVERSION N/A 02/11/2022   Procedure: TRANSESOPHAGEAL ECHOCARDIOGRAM (TEE);  Surgeon: [name], MD;  Location: [place];  Service: Open Heart Surgery;  Laterality: N/A;   THORACIC OUTLET SURGERY     TRANSTHORACIC ECHOCARDIOGRAM  07/04/2014   Normal LV Size/Fnx - EF 60-65% no RWMA; MV: thickened leaflets with doming, fixed Posterior leaflet, anterior leaflet w/ large/shaggy & mobile density.  c/w Rheumatic Mitral disease - moderate MS & Mod-Severe MR.   TUBAL LIGATION     VULVA SURGERY      Current Medications: Current Meds  Medication Sig   apixaban (ELIQUIS) 5 MG TABS tablet Take 1 tablet (5 mg total) by mouth 2 (two) times daily.   clobetasol (TEMOVATE) 0.05 % external solution Apply 1 application  topically daily as needed (scalp irritation.).   estradiol (ESTRACE) 0.1 MG/GM vaginal cream 1 gram vaginally twice weekly   furosemide (LASIX) 20 MG tablet Take 1 tablet (20 mg total) by mouth daily. For weight gain of 3 lbs in 24 or 5 lbs in 48 hours   hydrocortisone cream 1 % Apply 1 Application topically as needed for itching.   ketoconazole (NIZORAL) 2 % shampoo Apply 1 Application topically daily as needed (scalp irritation.).   liothyronine (CYTOMEL) 5 MCG tablet Take 5 mcg by mouth daily.    metroNIDAZOLE (METROCREAM) 0.75 % cream Apply 1 application  topically in the morning. Face   potassium chloride SA (KLOR-CON M) 20 MEQ tablet Take 1 tablet (20 mEq total) by mouth  daily as needed.   SYNTHROID 75 MCG tablet Take 75 mcg by mouth daily before breakfast.    terconazole (TERAZOL 3) 0.8 % vaginal cream Place 1 applicator vaginally at bedtime. Use for 3 days.     Allergies:   Estrace [estradiol], Reglan [metoclopramide], Warfarin and related, Other, Morphine and related, Singulair [montelukast sodium], and Statins   Social History   Socioeconomic History   Marital status: Single    Spouse name: Not on file   Number of children: 1   Years of education: 12   Highest education level: Not on file  Occupational History   Occupation: disabled  Tobacco Use   Smoking status: Former    Packs/day: 0.50    Years: 35.00    Total pack years: 17.50    Types: Cigarettes    Quit date: 06/17/2014    Years since quitting: 7.9   Smokeless tobacco: Never  Vaping Use   Vaping Use: Never used  Substance and Sexual Activity   Alcohol use: No   Drug use: No   Sexual  activity: Not Currently  Other Topics Concern   Not on file  Social History Narrative   Not on file   Social Determinants of Health   Financial Resource Strain: Not on file  Food Insecurity: No Food Insecurity (02/19/2022)   Hunger Vital Sign    Worried About Running Out of Food in the Last Year: Never true    Ran Out of Food in the Last Year: Never true  Transportation Needs: No Transportation Needs (02/19/2022)   PRAPARE - Hydrologist (Medical): No    Lack of Transportation (Non-Medical): No  Physical Activity: Not on file  Stress: Not on file  Social Connections: Not on file     Family History: The patient's family history includes Breast cancer in her paternal aunt; Colon polyps in her mother; Diabetes in her brother and sister; Irritable bowel syndrome in her daughter and mother; Kidney cancer in her mother; Liver disease in her sister. There is no history of Colon cancer or Thyroid disease.  ROS:   Please see the history of present illness.    (+)  Lightheadedness (+) Sensitivity/tenderness of PPM pocket All other systems reviewed and are negative.  EKGs/Labs/Other Studies Reviewed:    The following studies were reviewed today:  May 23, 2022 in clinic device interrogation personally reviewed Battery and lead parameters stable.  AV delays changed to optimize VP at 40 upon arrival underlying  EKG:  The ekg ordered today demonstrates AsVp, QRS duration 125m.  Recent Labs: 12/23/2021: BNP 181.3 02/11/2022: TSH 0.835 02/14/2022: ALT 14 02/16/2022: Magnesium 2.1 02/20/2022: Hemoglobin 8.3; Platelets 312 02/21/2022: BUN 6; Creatinine, Ser 0.65; Potassium 3.6; Sodium 135   Recent Lipid Panel    Component Value Date/Time   CHOL 256 (H) 06/26/2020 1124   TRIG 50.0 06/26/2020 1124   HDL 73.50 06/26/2020 1124   CHOLHDL 3 06/26/2020 1124   VLDL 10.0 06/26/2020 1124   LDLCALC 172 (H) 06/26/2020 1124    Physical Exam:    VS:  BP 130/70   Pulse 80   Ht 5' (1.524 m)   Wt 119 lb (54 kg)   SpO2 98%   BMI 23.24 kg/m     Wt Readings from Last 3 Encounters:  05/23/22 119 lb (54 kg)  05/23/22 120 lb (54.4 kg)  05/13/22 119 lb 4.3 oz (54.1 kg)     GEN: Well nourished, well developed in no acute distress CARDIAC: RRR, no murmurs, rubs, gallops.  Pacemaker pocket well-healed RESPIRATORY:  Clear to auscultation without rales, wheezing or rhonchi        ASSESSMENT:    1. Cardiac pacemaker in situ   2. Heart block AV complete ([place]   3. Severe mitral regurgitation   4. S/P MVR (mitral valve replacement)    PLAN:    In order of problems listed above:  #Complete heart block #Permanent pacemaker in situ Device functioning appropriately.  Continue remote monitoring.  #Severe MR #Post mitral valve replacement Doing well after her surgery.  Follow-up with APP in 1 year.  Medication Adjustments/Labs and Tests Ordered: Current medicines are reviewed at length with the patient today.  Concerns regarding medicines  are outlined above.   Orders Placed This Encounter  Procedures   CUP PACEART INCLINIC DEVICE CHECK   EKG 12-Lead   No orders of the defined types were placed in this encounter.  I,Mathew Stumpf,acting as a sEducation administratorfor CVickie Epley MD.,have documented all relevant documentation on the behalf of CVickie Epley  MD,as directed by  Vickie Epley, MD while in the presence of Vickie Epley, MD.  I, Vickie Epley, MD, have reviewed all documentation for this visit. The documentation on 05/23/22 for the exam, diagnosis, procedures, and orders are all accurate and complete.   Signed, Lars Mage, MD, [facility], [facility] 05/23/2022 3:45 PM    Electrophysiology [facility]

## 2022-05-26 ENCOUNTER — Ambulatory Visit (HOSPITAL_COMMUNITY): Payer: Medicare Other

## 2022-05-26 ENCOUNTER — Telehealth (HOSPITAL_COMMUNITY): Payer: Self-pay | Admitting: *Deleted

## 2022-05-26 NOTE — Telephone Encounter (Signed)
Spoke with Jackelyn Poling she want to hold exercise until she has her echo and follows up with Dr Percival Spanish again on 06/26/22. Will cancel appointments until then.Harrell Gave RN BSN

## 2022-05-27 NOTE — Progress Notes (Signed)
Cardiac Individual Treatment Plan  Patient Details  Name: Elaine Le MRN: [id number] Date of Birth: 1960-05-25 Referring Provider:   Flowsheet Row INTENSIVE CARDIAC REHAB ORIENT from 05/13/2022 in [facility] [place]  Referring Provider Minus Breeding, MD       Initial Encounter Date:  [place] from 05/13/2022 in [facility]  Date 05/13/22       Visit Diagnosis: 02/11/22 S/P MVR , 02/17/22 S/P Pacemaker  Patient's Home Medications on Admission:  Current Outpatient Medications:    clobetasol (TEMOVATE) 0.05 % external solution, Apply 1 application  topically daily as needed (scalp irritation.)., Disp: , Rfl:    estradiol (ESTRACE) 0.1 MG/GM vaginal cream, 1 gram vaginally twice weekly, Disp: 42.5 g, Rfl: 6   hydrocortisone cream 1 %, Apply 1 Application topically as needed for itching., Disp: , Rfl:    ketoconazole (NIZORAL) 2 % shampoo, Apply 1 Application topically daily as needed (scalp irritation.)., Disp: , Rfl:    liothyronine (CYTOMEL) 5 MCG tablet, Take 5 mcg by mouth daily. , Disp: , Rfl: 2   metroNIDAZOLE (METROCREAM) 0.75 % cream, Apply 1 application  topically in the morning. Face, Disp: , Rfl:    potassium chloride SA (KLOR-CON M) 20 MEQ tablet, Take 1 tablet (20 mEq total) by mouth daily as needed., Disp: 30 tablet, Rfl: 3   SYNTHROID 75 MCG tablet, Take 75 mcg by mouth daily before breakfast. , Disp: , Rfl:    terconazole (TERAZOL 3) 0.8 % vaginal cream, Place 1 applicator vaginally at bedtime. Use for 3 days., Disp: 20 g, Rfl: 1   apixaban (ELIQUIS) 5 MG TABS tablet, Take 1 tablet (5 mg total) by mouth 2 (two) times daily., Disp: 60 tablet, Rfl: 3   furosemide (LASIX) 20 MG tablet, Take 1 tablet (20 mg total) by mouth daily. For weight gain of 3 lbs in 24 or 5 lbs in 48 hours, Disp: 90 tablet, Rfl: 3   metoprolol  tartrate (LOPRESSOR) 25 MG tablet, Take 0.5 tablets (12.5 mg total) by mouth 2 (two) times daily. (Patient not taking: Reported on 05/23/2022), Disp: 60 tablet, Rfl: 3  Past Medical History: Past Medical History:  Diagnosis Date   Acute respiratory failure with hypercapnia ([place]) 93/81/0175   Acute systolic heart failure ([place]) 09/2014   Adenomatous colon polyp    Arthritis    Back pain    Chronic diastolic heart failure (HCC)    related to MR/MS   Chronic pain syndrome    Chronic sinusitis    COPD (chronic obstructive pulmonary disease)-PFT pending  07/17/2014   Diverticulosis    Dyspnea    Fibromyalgia    GERD (gastroesophageal reflux disease)    Headaches, cluster    Heart murmur    Hiatal hernia    Hx of adenomatous colonic polyps 03/10/2011   Oct 2012, repeat colon Oct 2017    Hypothyroidism    Internal hemorrhoids    Irritable bowel syndrome (IBS)    Mitral valve regurgitation    severe   Mitral valve stenosis, moderate 07/11/2014   Pneumonia    Feb. 24, 2016   Protein-calorie malnutrition, severe ([place]) 07/06/2014   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/10/2014   Severe mitral regurgitation with moderate mitral stenosis   S/P minimally invasive mitral valve replacement with bioprosthetic valve 11/08/2014   25 mm [facility] Mitral bovine bioprosthetic tissue valve placed  via right mini thoracotomy approach   Sleep apnea    uses CPAP-setting 7   Tobacco abuse     Tobacco Use: Social History   Tobacco Use  Smoking Status Former   Packs/day: 0.50   Years: 35.00   Total pack years: 17.50   Types: Cigarettes   Quit date: 06/17/2014   Years since quitting: 7.9  Smokeless Tobacco Never    Labs: Review Flowsheet  More data exists      Latest Ref Rng & Units 12/26/2021 02/07/2022 02/11/2022 02/12/2022 02/13/2022  Labs for ITP Cardiac and Pulmonary Rehab  Hemoglobin A1c 4.8 - 5.6 % - 5.0  - - -  PH, Arterial 7.35 - 7.45 7.386  7.47  7.319  7.293  7.305  7.308   7.435  7.483  7.363  7.417  7.478  7.429  7.396  -  PCO2 arterial 32 - 48 mmHg 36.6  33  39.8  42.1  43.3  40.6  36.7  35.4  46.0  42.9  37.3  40.0  36.9  -  Bicarbonate 20.0 - 28.0 mmol/L 22.0  23.2  24.0  20.7  20.7  21.6  20.6  24.6  26.5  26.1  27.7  26.3  27.7  26.5  22.8  -  TCO2 22 - 32 mmol/L 23  24  - '22  22  23  22  26  26  26  28  27  28  26  29  28  27  29  24  28  27  24  '$ -  Acid-base deficit 0.0 - 2.0 mmol/L 3.0  2.0  - 5.0  6.0  4.0  6.0  2.0  -  O2 Saturation % 93  68  98.6  98  66.7  99  74.5  98  92  100  100  100  100  77  100  100  71.8  68.6  71.8  99  66     Capillary Blood Glucose: Lab Results  Component Value Date   GLUCAP 99 02/19/2022   GLUCAP 107 (H) 02/19/2022   GLUCAP 105 (H) 02/18/2022   GLUCAP 128 (H) 02/18/2022   GLUCAP 132 (H) 02/18/2022     Exercise Target Goals: Exercise Program Goal: Individual exercise prescription set using results from initial 6 min walk test and THRR while considering  patient's activity barriers and safety.   Exercise Prescription Goal: Initial exercise prescription builds to 30-45 minutes a day of aerobic activity, 2-3 days per week.  Home exercise guidelines will be given to patient during program as part of exercise prescription that the participant will acknowledge.  Activity Barriers & Risk Stratification:  Activity Barriers & Cardiac Risk Stratification - 05/13/22 1131       Activity Barriers & Cardiac Risk Stratification   Activity Barriers Other (comment)    Comments Patient has some right sided numbness/pain from neck to right fingers that she is being evaluated for.    Cardiac Risk Stratification Moderate             6 Minute Walk:  6 Minute Walk     Row Name 05/13/22 1021         6 Minute Walk   Phase Initial     Distance 1863 feet     Walk Time 6 minutes     # of Rest Breaks 0     MPH 3.53     METS 4.69     RPE 7  Perceived Dyspnea  0     VO2 Peak 16.42     Symptoms No     Resting HR 81  bpm     Resting BP 124/62     Resting Oxygen Saturation  97 %     Exercise Oxygen Saturation  during 6 min walk --  Unable to obtain, cold fingers     Max Ex. HR 116 bpm     Max Ex. BP 142/52     2 Minute Post BP 142/60              Oxygen Initial Assessment:   Oxygen Re-Evaluation:   Oxygen Discharge (Final Oxygen Re-Evaluation):   Initial Exercise Prescription:  Initial Exercise Prescription - 05/13/22 1100       Date of Initial Exercise RX and Referring Provider   Date 05/13/22    Referring Provider Minus Breeding, MD    Expected Discharge Date 07/11/22      Recumbant Bike   Level 1    Watts 45    Minutes 15    METs 4.6      Recumbant Elliptical   Level 1    Watts 60    Minutes 15    METs 4.5      Prescription Details   Frequency (times per week) 3    Duration Progress to 30 minutes of continuous aerobic without signs/symptoms of physical distress      Intensity   THRR 40-80% of Max Heartrate 64-127    Ratings of Perceived Exertion 11-13    Perceived Dyspnea 0-4      Progression   Progression Continue to progress workloads to maintain intensity without signs/symptoms of physical distress.      Resistance Training   Training Prescription Yes    Weight 2 lbs    Reps 10-15             Perform Capillary Blood Glucose checks as needed.  Exercise Prescription Changes:   Exercise Comments:   Exercise Comments     Row Name 05/27/22 1451           Exercise Comments Delayed start, patient on medical hold.                Exercise Goals and Review:   Exercise Goals     Row Name 05/13/22 1032             Exercise Goals   Increase Physical Activity Yes       Intervention Provide advice, education, support and counseling about physical activity/exercise needs.;Develop an individualized exercise prescription for aerobic and resistive training based on initial evaluation findings, risk stratification, comorbidities and  participant's personal goals.       Expected Outcomes Short Term: Attend rehab on a regular basis to increase amount of physical activity.;Long Term: Exercising regularly at least 3-5 days a week.;Long Term: Add in home exercise to make exercise part of routine and to increase amount of physical activity.       Increase Strength and Stamina Yes       Intervention Provide advice, education, support and counseling about physical activity/exercise needs.;Develop an individualized exercise prescription for aerobic and resistive training based on initial evaluation findings, risk stratification, comorbidities and participant's personal goals.       Expected Outcomes Short Term: Increase workloads from initial exercise prescription for resistance, speed, and METs.;Short Term: Perform resistance training exercises routinely during rehab and add in resistance training at home;Long Term: Improve cardiorespiratory fitness, muscular  endurance and strength as measured by increased METs and functional capacity (6MWT)       Able to understand and use rate of perceived exertion (RPE) scale Yes       Intervention Provide education and explanation on how to use RPE scale       Expected Outcomes Short Term: Able to use RPE daily in rehab to express subjective intensity level;Long Term:  Able to use RPE to guide intensity level when exercising independently       Knowledge and understanding of Target Heart Rate Range (THRR) Yes       Intervention Provide education and explanation of THRR including how the numbers were predicted and where they are located for reference       Expected Outcomes Short Term: Able to state/look up THRR;Short Term: Able to use daily as guideline for intensity in rehab;Long Term: Able to use THRR to govern intensity when exercising independently       Able to check pulse independently Yes       Intervention Provide education and demonstration on how to check pulse in carotid and radial  arteries.;Review the importance of being able to check your own pulse for safety during independent exercise       Expected Outcomes Long Term: Able to check pulse independently and accurately;Short Term: Able to explain why pulse checking is important during independent exercise       Understanding of Exercise Prescription Yes       Intervention Provide education, explanation, and written materials on patient's individual exercise prescription       Expected Outcomes Short Term: Able to explain program exercise prescription;Long Term: Able to explain home exercise prescription to exercise independently                Exercise Goals Re-Evaluation :   Discharge Exercise Prescription (Final Exercise Prescription Changes):   Nutrition:  Target Goals: Understanding of nutrition guidelines, daily intake of sodium '1500mg'$ , cholesterol '200mg'$ , calories 30% from fat and 7% or less from saturated fats, daily to have 5 or more servings of fruits and vegetables.  Biometrics:  Pre Biometrics - 05/13/22 0942       Pre Biometrics   Waist Circumference 32.25 inches    Hip Circumference 37.5 inches    Waist to Hip Ratio 0.86 %    Triceps Skinfold 16 mm    % Body Fat 32.7 %    Grip Strength 22 kg    Flexibility 20.25 in    Single Leg Stand 26.5 seconds              Nutrition Therapy Plan and Nutrition Goals:   Nutrition Assessments:  MEDIFICTS Score Key: ?70 Need to make dietary changes  40-70 Heart Healthy Diet ? 40 Therapeutic Level Cholesterol Diet    Picture Your Plate Scores: <32 Unhealthy dietary pattern with much room for improvement. 41-50 Dietary pattern unlikely to meet recommendations for good health and room for improvement. 51-60 More healthful dietary pattern, with some room for improvement.  >60 Healthy dietary pattern, although there may be some specific behaviors that could be improved.    Nutrition Goals Re-Evaluation:   Nutrition Goals  Re-Evaluation:   Nutrition Goals Discharge (Final Nutrition Goals Re-Evaluation):   Psychosocial: Target Goals: Acknowledge presence or absence of significant depression and/or stress, maximize coping skills, provide positive support system. Participant is able to verbalize types and ability to use techniques and skills needed for reducing stress and depression.  Initial Review &  Psychosocial Screening:  Initial Psych Review & Screening - 05/13/22 1221       Initial Review   Current issues with None Identified      Family Dynamics   Good Support System? Yes   Jackelyn Poling has has daughter friends in the neighborhod , sister and Mom for support. Elaine Le lives alone with her pets.     Barriers   Psychosocial barriers to participate in program There are no identifiable barriers or psychosocial needs.      Screening Interventions   Interventions Encouraged to exercise             Quality of Life Scores:  Quality of Life - 05/13/22 1141       Quality of Life   Select Quality of Life      Quality of Life Scores   Health/Function Pre 27.57 %    Socioeconomic Pre 28 %    Psych/Spiritual Pre 27.21 %    Family Pre 28.13 %    GLOBAL Pre 27.66 %            Scores of 19 and below usually indicate a poorer quality of life in these areas.  A difference of  2-3 points is a clinically meaningful difference.  A difference of 2-3 points in the total score of the Quality of Life Index has been associated with significant improvement in overall quality of life, self-image, physical symptoms, and general health in studies assessing change in quality of life.  PHQ-9: Review Flowsheet  More data exists      05/13/2022 06/18/2021 06/26/2020 10/08/2015 03/15/2015  Depression screen PHQ 2/9  Decreased Interest 0 0 0 0 0 0  Down, Depressed, Hopeless 0 0 1 0 0 0  PHQ - 2 Score 0 0 1 0 0 0  Altered sleeping 0 - - - -  Tired, decreased energy 0 - - - -  Change in appetite 0 - - - -  Feeling bad  or failure about yourself  0 - - - -  Trouble concentrating 0 - - - -  Moving slowly or fidgety/restless 0 - - - -  Suicidal thoughts 0 - - - -  PHQ-9 Score 0 - - - -  Difficult doing work/chores Not difficult at all - - - -   Interpretation of Total Score  Total Score Depression Severity:  1-4 = Minimal depression, 5-9 = Mild depression, 10-14 = Moderate depression, 15-19 = Moderately severe depression, 20-27 = Severe depression   Psychosocial Evaluation and Intervention:   Psychosocial Re-Evaluation:   Psychosocial Discharge (Final Psychosocial Re-Evaluation):   Vocational Rehabilitation: Provide vocational rehab assistance to qualifying candidates.   Vocational Rehab Evaluation & Intervention:  Vocational Rehab - 05/13/22 1120       Initial Vocational Rehab Evaluation & Intervention   Assessment shows need for Vocational Rehabilitation No   Jackelyn Poling is disabled and does not need vocational rehab at this time.            Education: Education Goals: Education classes will be provided on a weekly basis, covering required topics. Participant will state understanding/return demonstration of topics presented.     Core Videos: Exercise    Move It!  Clinical staff conducted group or individual video education with verbal and written material and guidebook.  Patient learns the recommended Pritikin exercise program. Exercise with the goal of living a long, healthy life. Some of the health benefits of exercise include controlled diabetes, healthier blood pressure levels, improved cholesterol levels,  improved heart and lung capacity, improved sleep, and better body composition. Everyone should speak with their doctor before starting or changing an exercise routine.  Biomechanical Limitations Clinical staff conducted group or individual video education with verbal and written material and guidebook.  Patient learns how biomechanical limitations can impact exercise and how we can  mitigate and possibly overcome limitations to have an impactful and balanced exercise routine.  Body Composition Clinical staff conducted group or individual video education with verbal and written material and guidebook.  Patient learns that body composition (ratio of muscle mass to fat mass) is a key component to assessing overall fitness, rather than body weight alone. Increased fat mass, especially visceral belly fat, can put [place] at increased risk for metabolic syndrome, type 2 diabetes, heart disease, and even death. It is recommended to combine diet and exercise (cardiovascular and resistance training) to improve your body composition. Seek guidance from your physician and exercise physiologist before implementing an exercise routine.  Exercise Action Plan Clinical staff conducted group or individual video education with verbal and written material and guidebook.  Patient learns the recommended strategies to achieve and enjoy long-term exercise adherence, including variety, self-motivation, self-efficacy, and positive decision making. Benefits of exercise include fitness, good health, weight management, more energy, better sleep, less stress, and overall well-being.  Medical   Heart Disease Risk Reduction Clinical staff conducted group or individual video education with verbal and written material and guidebook.  Patient learns our heart is our most vital organ as it circulates oxygen, nutrients, white blood cells, and hormones throughout the entire body, and carries waste away. Data supports a plant-based eating plan like the Pritikin Program for its effectiveness in slowing progression of and reversing heart disease. The video provides a number of recommendations to address heart disease.   Metabolic Syndrome and Belly Fat  Clinical staff conducted group or individual video education with verbal and written material and guidebook.  Patient learns what metabolic syndrome is, how it leads to  heart disease, and how one can reverse it and keep it from coming back. You have metabolic syndrome if you have 3 of the following 5 criteria: abdominal obesity, high blood pressure, high triglycerides, low HDL cholesterol, and high blood sugar.  Hypertension and Heart Disease Clinical staff conducted group or individual video education with verbal and written material and guidebook.  Patient learns that high blood pressure, or hypertension, is very common in the [place]. Hypertension is largely due to excessive salt intake, but other important risk factors include being overweight, physical inactivity, drinking too much alcohol, smoking, and not eating enough potassium from fruits and vegetables. High blood pressure is a leading risk factor for heart attack, stroke, congestive heart failure, dementia, kidney failure, and premature death. Long-term effects of excessive salt intake include stiffening of the arteries and thickening of heart muscle and organ damage. Recommendations include ways to reduce hypertension and the risk of heart disease.  Diseases of Our Time - Focusing on Diabetes Clinical staff conducted group or individual video education with verbal and written material and guidebook.  Patient learns why the best way to stop diseases of our time is prevention, through food and other lifestyle changes. Medicine (such as prescription pills and surgeries) is often only a Band-Aid on the problem, not a long-term solution. Most common diseases of our time include obesity, type 2 diabetes, hypertension, heart disease, and cancer. The Pritikin Program is recommended and has been proven to help reduce, reverse, and/or prevent the  damaging effects of metabolic syndrome.  Nutrition   Overview of the Pritikin Eating Plan  Clinical staff conducted group or individual video education with verbal and written material and guidebook.  Patient learns about the [place] for disease risk  reduction. The [place] emphasizes a wide variety of unrefined, minimally-processed carbohydrates, like fruits, vegetables, whole grains, and legumes. Go, Caution, and Stop food choices are explained. Plant-based and lean animal proteins are emphasized. Rationale provided for low sodium intake for blood pressure control, low added sugars for blood sugar stabilization, and low added fats and oils for coronary artery disease risk reduction and weight management.  Calorie Density  Clinical staff conducted group or individual video education with verbal and written material and guidebook.  Patient learns about calorie density and how it impacts the Pritikin Eating Plan. Knowing the characteristics of the food you choose will help you decide whether those foods will lead to weight gain or weight loss, and whether you want to consume more or less of them. Weight loss is usually a side effect of the Pritikin Eating Plan because of its focus on low calorie-dense foods.  Label Reading  Clinical staff conducted group or individual video education with verbal and written material and guidebook.  Patient learns about the Pritikin recommended label reading guidelines and corresponding recommendations regarding calorie density, added sugars, sodium content, and whole grains.  Dining Out - Part 1  Clinical staff conducted group or individual video education with verbal and written material and guidebook.  Patient learns that restaurant meals can be sabotaging because they can be so high in calories, fat, sodium, and/or sugar. Patient learns recommended strategies on how to positively address this and avoid unhealthy pitfalls.  Facts on Fats  Clinical staff conducted group or individual video education with verbal and written material and guidebook.  Patient learns that lifestyle modifications can be just as effective, if not more so, as many medications for lowering your risk of heart disease. A  Pritikin lifestyle can help to reduce your risk of inflammation and atherosclerosis (cholesterol build-up, or plaque, in the artery walls). Lifestyle interventions such as dietary choices and physical activity address the cause of atherosclerosis. A review of the types of fats and their impact on blood cholesterol levels, along with dietary recommendations to reduce fat intake is also included.  Nutrition Action Plan  Clinical staff conducted group or individual video education with verbal and written material and guidebook.  Patient learns how to incorporate Pritikin recommendations into their lifestyle. Recommendations include planning and keeping personal health goals in mind as an important part of their success.  Healthy Mind-Set    Healthy Minds, Bodies, Hearts  Clinical staff conducted group or individual video education with verbal and written material and guidebook.  Patient learns how to identify when they are stressed. Video will discuss the impact of that stress, as well as the many benefits of stress management. Patient will also be introduced to stress management techniques. The way we think, act, and feel has an impact on our hearts.  How Our Thoughts Can Heal Our Hearts  Clinical staff conducted group or individual video education with verbal and written material and guidebook.  Patient learns that negative thoughts can cause depression and anxiety. This can result in negative lifestyle behavior and serious health problems. Cognitive behavioral therapy is an effective method to help control our thoughts in order to change and improve our emotional outlook.  Additional Videos:  Exercise  Improving Performance  Clinical staff conducted group or individual video education with verbal and written material and guidebook.  Patient learns to use a non-linear approach by alternating intensity levels and lengths of time spent exercising to help burn more calories and lose more body fat.  Cardiovascular exercise helps improve heart health, metabolism, hormonal balance, blood sugar control, and recovery from fatigue. Resistance training improves strength, endurance, balance, coordination, reaction time, metabolism, and muscle mass. Flexibility exercise improves circulation, posture, and balance. Seek guidance from your physician and exercise physiologist before implementing an exercise routine and learn your capabilities and proper form for all exercise.  Introduction to Yoga  Clinical staff conducted group or individual video education with verbal and written material and guidebook.  Patient learns about yoga, a discipline of the coming together of mind, breath, and body. The benefits of yoga include improved flexibility, improved range of motion, better posture and core strength, increased lung function, weight loss, and positive self-image. Yoga's heart health benefits include lowered blood pressure, healthier heart rate, decreased cholesterol and triglyceride levels, improved immune function, and reduced stress. Seek guidance from your physician and exercise physiologist before implementing an exercise routine and learn your capabilities and proper form for all exercise.  Medical   Aging: Enhancing Your Quality of Life  Clinical staff conducted group or individual video education with verbal and written material and guidebook.  Patient learns key strategies and recommendations to stay in good physical health and enhance quality of life, such as prevention strategies, having an advocate, securing a [place], and keeping a list of medications and system for tracking them. It also discusses how to avoid risk for bone loss.  Biology of Weight Control  Clinical staff conducted group or individual video education with verbal and written material and guidebook.  Patient learns that weight gain occurs because we consume more calories than we burn (eating more,  moving less). Even if your body weight is normal, you may have higher ratios of fat compared to muscle mass. Too much body fat puts you at increased risk for cardiovascular disease, heart attack, stroke, type 2 diabetes, and obesity-related cancers. In addition to exercise, following the [place] can help reduce your risk.  Decoding Lab Results  Clinical staff conducted group or individual video education with verbal and written material and guidebook.  Patient learns that lab test reflects one measurement whose values change over time and are influenced by many factors, including medication, stress, sleep, exercise, food, hydration, pre-existing medical conditions, and more. It is recommended to use the knowledge from this video to become more involved with your lab results and evaluate your numbers to speak with your doctor.   Diseases of Our Time - Overview  Clinical staff conducted group or individual video education with verbal and written material and guidebook.  Patient learns that according to the CDC, 50% to 70% of chronic diseases (such as obesity, type 2 diabetes, elevated lipids, hypertension, and heart disease) are avoidable through lifestyle improvements including healthier food choices, listening to satiety cues, and increased physical activity.  Sleep Disorders Clinical staff conducted group or individual video education with verbal and written material and guidebook.  Patient learns how good quality and duration of sleep are important to overall health and well-being. Patient also learns about sleep disorders and how they impact health along with recommendations to address them, including discussing with a physician.  Nutrition  Dining Out - Part 2 Clinical staff conducted  group or individual video education with verbal and written material and guidebook.  Patient learns how to plan ahead and communicate in order to maximize their dining experience in a healthy and  nutritious manner. Included are recommended food choices based on the type of restaurant the patient is visiting.   Fueling a Best boy conducted group or individual video education with verbal and written material and guidebook.  There is a strong connection between our food choices and our health. Diseases like obesity and type 2 diabetes are very prevalent and are in large-part due to lifestyle choices. The Pritikin Eating Plan provides plenty of food and hunger-curbing satisfaction. It is easy to follow, affordable, and helps reduce health risks.  Menu Workshop  Clinical staff conducted group or individual video education with verbal and written material and guidebook.  Patient learns that restaurant meals can sabotage health goals because they are often packed with calories, fat, sodium, and sugar. Recommendations include strategies to plan ahead and to communicate with the manager, chef, or server to help order a healthier meal.  Planning Your Eating Strategy  Clinical staff conducted group or individual video education with verbal and written material and guidebook.  Patient learns about the [place] and its benefit of reducing the risk of disease. The [place] does not focus on calories. Instead, it emphasizes high-quality, nutrient-rich foods. By knowing the characteristics of the foods, we choose, we can determine their calorie density and make informed decisions.  Targeting Your Nutrition Priorities  Clinical staff conducted group or individual video education with verbal and written material and guidebook.  Patient learns that lifestyle habits have a tremendous impact on disease risk and progression. This video provides eating and physical activity recommendations based on your personal health goals, such as reducing LDL cholesterol, losing weight, preventing or controlling type 2 diabetes, and reducing high blood pressure.  Vitamins and  Minerals  Clinical staff conducted group or individual video education with verbal and written material and guidebook.  Patient learns different ways to obtain key vitamins and minerals, including through a recommended healthy diet. It is important to discuss all supplements you take with your doctor.   Healthy Mind-Set    Smoking Cessation  Clinical staff conducted group or individual video education with verbal and written material and guidebook.  Patient learns that cigarette smoking and tobacco addiction pose a serious health risk which affects millions of people. Stopping smoking will significantly reduce the risk of heart disease, lung disease, and many forms of cancer. Recommended strategies for quitting are covered, including working with your doctor to develop a successful plan.  Culinary   Becoming a Financial trader conducted group or individual video education with verbal and written material and guidebook.  Patient learns that cooking at home can be healthy, cost-effective, quick, and puts them in control. Keys to cooking healthy recipes will include looking at your recipe, assessing your equipment needs, planning ahead, making it simple, choosing cost-effective seasonal ingredients, and limiting the use of added fats, salts, and sugars.  Cooking - Breakfast and Snacks  Clinical staff conducted group or individual video education with verbal and written material and guidebook.  Patient learns how important breakfast is to satiety and nutrition through the entire day. Recommendations include key foods to eat during breakfast to help stabilize blood sugar levels and to prevent overeating at meals later in the day. Planning ahead is also a key component.  Cooking - Holiday representative  and Sides  Clinical staff conducted group or individual video education with verbal and written material and guidebook.  Patient learns eating strategies to improve overall health, including an approach  to cook more at home. Recommendations include thinking of animal protein as a side on your plate rather than center stage and focusing instead on lower calorie dense options like vegetables, fruits, whole grains, and plant-based proteins, such as beans. Making sauces in large quantities to freeze for later and leaving the skin on your vegetables are also recommended to maximize your experience.  Cooking - Healthy Salads and Dressing Clinical staff conducted group or individual video education with verbal and written material and guidebook.  Patient learns that vegetables, fruits, whole grains, and legumes are the foundations of the [place]. Recommendations include how to incorporate each of these in flavorful and healthy salads, and how to create homemade salad dressings. Proper handling of ingredients is also covered. Cooking - Soups and [employer] - Soups and Desserts Clinical staff conducted group or individual video education with verbal and written material and guidebook.  Patient learns that Pritikin soups and desserts make for easy, nutritious, and delicious snacks and meal components that are low in sodium, fat, sugar, and calorie density, while high in vitamins, minerals, and filling fiber. Recommendations include simple and healthy ideas for soups and desserts.   Overview     The Pritikin Solution Program Overview Clinical staff conducted group or individual video education with verbal and written material and guidebook.  Patient learns that the results of the [place] Program have been documented in more than 100 articles published in peer-reviewed journals, and the benefits include reducing risk factors for (and, in some cases, even reversing) high cholesterol, high blood pressure, type 2 diabetes, obesity, and more! An overview of the three key pillars of the Pritikin Program will be covered: eating well, doing regular exercise, and having a healthy  mind-set.  WORKSHOPS  Exercise: Exercise Basics: Building Your Action Plan Clinical staff led group instruction and group discussion with PowerPoint presentation and patient guidebook. To enhance the learning environment the use of posters, models and videos may be added. At the conclusion of this workshop, patients will comprehend the difference between physical activity and exercise, as well as the benefits of incorporating both, into their routine. Patients will understand the FITT (Frequency, Intensity, Time, and Type) principle and how to use it to build an exercise action plan. In addition, safety concerns and other considerations for exercise and cardiac rehab will be addressed by the presenter. The purpose of this lesson is to promote a comprehensive and effective weekly exercise routine in order to improve patients' overall level of fitness.   Managing Heart Disease: Your Path to a Healthier Heart Clinical staff led group instruction and group discussion with PowerPoint presentation and patient guidebook. To enhance the learning environment the use of posters, models and videos may be added.At the conclusion of this workshop, patients will understand the anatomy and physiology of the heart. Additionally, they will understand how Pritikin's three pillars impact the risk factors, the progression, and the management of heart disease.  The purpose of this lesson is to provide a high-level overview of the heart, heart disease, and how the Pritikin lifestyle positively impacts risk factors.  Exercise Biomechanics Clinical staff led group instruction and group discussion with PowerPoint presentation and patient guidebook. To enhance the learning environment the use of posters, models and videos may be added. Patients will learn how the  structural parts of their bodies function and how these functions impact their daily activities, movement, and exercise. Patients will learn how to promote a  neutral spine, learn how to manage pain, and identify ways to improve their physical movement in order to promote healthy living. The purpose of this lesson is to expose patients to common physical limitations that impact physical activity. Participants will learn practical ways to adapt and manage aches and pains, and to minimize their effect on regular exercise. Patients will learn how to maintain good posture while sitting, walking, and lifting.  Balance Training and Fall Prevention  Clinical staff led group instruction and group discussion with PowerPoint presentation and patient guidebook. To enhance the learning environment the use of posters, models and videos may be added. At the conclusion of this workshop, patients will understand the importance of their sensorimotor skills (vision, proprioception, and the vestibular system) in maintaining their ability to balance as they age. Patients will apply a variety of balancing exercises that are appropriate for their current level of function. Patients will understand the common causes for poor balance, possible solutions to these problems, and ways to modify their physical environment in order to minimize their fall risk. The purpose of this lesson is to teach patients about the importance of maintaining balance as they age and ways to minimize their risk of falling.  WORKSHOPS   Nutrition:  Fueling a Scientist, research (physical sciences) led group instruction and group discussion with PowerPoint presentation and patient guidebook. To enhance the learning environment the use of posters, models and videos may be added. Patients will review the foundational principles of the [place] and understand what constitutes a serving size in each of the food groups. Patients will also learn Pritikin-friendly foods that are better choices when away from home and review make-ahead meal and snack options. Calorie density will be reviewed and applied to  three nutrition priorities: weight maintenance, weight loss, and weight gain. The purpose of this lesson is to reinforce (in a group setting) the key concepts around what patients are recommended to eat and how to apply these guidelines when away from home by planning and selecting Pritikin-friendly options. Patients will understand how calorie density may be adjusted for different weight management goals.  Mindful Eating  Clinical staff led group instruction and group discussion with PowerPoint presentation and patient guidebook. To enhance the learning environment the use of posters, models and videos may be added. Patients will briefly review the concepts of the [place] and the importance of low-calorie dense foods. The concept of mindful eating will be introduced as well as the importance of paying attention to internal hunger signals. Triggers for non-hunger eating and techniques for dealing with triggers will be explored. The purpose of this lesson is to provide patients with the opportunity to review the basic principles of the [place], discuss the value of eating mindfully and how to measure internal cues of hunger and fullness using the Hunger Scale. Patients will also discuss reasons for non-hunger eating and learn strategies to use for controlling emotional eating.  Targeting Your Nutrition Priorities Clinical staff led group instruction and group discussion with PowerPoint presentation and patient guidebook. To enhance the learning environment the use of posters, models and videos may be added. Patients will learn how to determine their genetic susceptibility to disease by reviewing their family history. Patients will gain insight into the importance of diet as part of an overall healthy lifestyle in mitigating the  impact of genetics and other environmental insults. The purpose of this lesson is to provide patients with the opportunity to assess their personal nutrition  priorities by looking at their family history, their own health history and current risk factors. Patients will also be able to discuss ways of prioritizing and modifying the [place] for their highest risk areas  Menu  Clinical staff led group instruction and group discussion with PowerPoint presentation and patient guidebook. To enhance the learning environment the use of posters, models and videos may be added. Using menus brought in from [employer], or printed from [employer], patients will apply the [place] dining out guidelines that were presented in the [employer] video. Patients will also be able to practice these guidelines in a variety of provided scenarios. The purpose of this lesson is to provide patients with the opportunity to practice hands-on learning of the [place] with actual menus and practice scenarios.  Label Reading Clinical staff led group instruction and group discussion with PowerPoint presentation and patient guidebook. To enhance the learning environment the use of posters, models and videos may be added. Patients will review and discuss the Pritikin label reading guidelines presented in Pritikin's Label Reading Educational series video. Using fool labels brought in from local grocery stores and markets, patients will apply the label reading guidelines and determine if the packaged food meet the Pritikin guidelines. The purpose of this lesson is to provide patients with the opportunity to review, discuss, and practice hands-on learning of the Pritikin Label Reading guidelines with actual packaged food labels. [place] Workshops are designed to teach patients ways to prepare quick, simple, and affordable recipes at home. The importance of nutrition's role in chronic disease risk reduction is reflected in its emphasis in the overall Pritikin program. By learning how to prepare essential  core Pritikin Eating Plan recipes, patients will increase control over what they eat; be able to customize the flavor of foods without the use of added salt, sugar, or fat; and improve the quality of the food they consume. By learning a set of core recipes which are easily assembled, quickly prepared, and affordable, patients are more likely to prepare more healthy foods at home. These workshops focus on convenient breakfasts, simple entres, side dishes, and desserts which can be prepared with minimal effort and are consistent with nutrition recommendations for cardiovascular risk reduction. Cooking [employer] are taught by a Engineer, materials (RD) who has been trained by the [employer]. The chef or RD has a clear understanding of the importance of minimizing - if not completely eliminating - added fat, sugar, and sodium in recipes. Throughout the series of [place] Workshop sessions, patients will learn about healthy ingredients and efficient methods of cooking to build confidence in their capability to prepare    Cooking School weekly topics:  Adding Flavor- Sodium-Free  Fast and Healthy Breakfasts  Powerhouse Plant-Based Proteins  Satisfying Salads and Dressings  Simple Sides and Sauces  International Cuisine-Spotlight on the [place] Zones  Delicious Desserts  Savory Soups  Efficiency Cooking - Meals in a Snap  Tasty Appetizers and Snacks  Comforting Weekend Breakfasts  One-Pot Wonders   Fast Evening Meals  Easy [place] (Psychosocial): New Thoughts, New Behaviors Clinical staff led group instruction and group discussion with PowerPoint presentation and patient guidebook. To enhance the learning environment the use  of posters, models and videos may be added. Patients will learn and practice techniques for developing effective health and lifestyle goals. Patients will be able to  effectively apply the goal setting process learned to develop at least one new personal goal.  The purpose of this lesson is to expose patients to a new skill set of behavior modification techniques such as techniques setting SMART goals, overcoming barriers, and achieving new thoughts and new behaviors.  Managing Moods and Relationships Clinical staff led group instruction and group discussion with PowerPoint presentation and patient guidebook. To enhance the learning environment the use of posters, models and videos may be added. Patients will learn how emotional and chronic stress factors can impact their health and relationships. They will learn healthy ways to manage their moods and utilize positive coping mechanisms. In addition, ICR patients will learn ways to improve communication skills. The purpose of this lesson is to expose patients to ways of understanding how one's mood and health are intimately connected. Developing a healthy outlook can help build positive relationships and connections with others. Patients will understand the importance of utilizing effective communication skills that include actively listening and being heard. They will learn and understand the importance of the "4 Cs" and especially Connections in fostering of a Healthy Mind-Set.  Healthy Sleep for a Healthy Heart Clinical staff led group instruction and group discussion with PowerPoint presentation and patient guidebook. To enhance the learning environment the use of posters, models and videos may be added. At the conclusion of this workshop, patients will be able to demonstrate knowledge of the importance of sleep to overall health, well-being, and quality of life. They will understand the symptoms of, and treatments for, common sleep disorders. Patients will also be able to identify daytime and nighttime behaviors which impact sleep, and they will be able to apply these tools to help manage sleep-related challenges. The  purpose of this lesson is to provide patients with a general overview of sleep and outline the importance of quality sleep. Patients will learn about a few of the most common sleep disorders. Patients will also be introduced to the concept of "sleep hygiene," and discover ways to self-manage certain sleeping problems through simple daily behavior changes. Finally, the workshop will motivate patients by clarifying the links between quality sleep and their goals of heart-healthy living.   Recognizing and Reducing Stress Clinical staff led group instruction and group discussion with PowerPoint presentation and patient guidebook. To enhance the learning environment the use of posters, models and videos may be added. At the conclusion of this workshop, patients will be able to understand the types of stress reactions, differentiate between acute and chronic stress, and recognize the impact that chronic stress has on their health. They will also be able to apply different coping mechanisms, such as reframing negative self-talk. Patients will have the opportunity to practice a variety of stress management techniques, such as deep abdominal breathing, progressive muscle relaxation, and/or guided imagery.  The purpose of this lesson is to educate patients on the role of stress in their lives and to provide healthy techniques for coping with it.  Learning Barriers/Preferences:  Learning Barriers/Preferences - 05/13/22 1223       Learning Barriers/Preferences   Learning Barriers None    Learning Preferences Audio;Group Instruction;Individual Instruction;Written Material;Verbal Instruction;Skilled Demonstration;Pictoral             Education Topics:  Knowledge Questionnaire Score:  Knowledge Questionnaire Score - 05/13/22 1201  Knowledge Questionnaire Score   Pre Score 20/24             Core Components/Risk Factors/Patient Goals at Admission:  Personal Goals and Risk Factors at Admission  - 05/13/22 1141       Core Components/Risk Factors/Patient Goals on Admission    Weight Management Weight Loss;Yes    Intervention Weight Management: Develop a combined nutrition and exercise program designed to reach desired caloric intake, while maintaining appropriate intake of nutrient and fiber, sodium and fats, and appropriate energy expenditure required for the weight goal.;Weight Management: Provide education and appropriate resources to help participant work on and attain dietary goals.    Admit Weight 119 lb 4.3 oz (54.1 kg)    Goal Weight: Short Term 111 lb (50.3 kg)    Goal Weight: Long Term 103 lb (46.7 kg)    Expected Outcomes Short Term: Continue to assess and modify interventions until short term weight is achieved;Long Term: Adherence to nutrition and physical activity/exercise program aimed toward attainment of established weight goal;Weight Loss: Understanding of general recommendations for a balanced deficit meal plan, which promotes 1-2 lb weight loss per week and includes a negative energy balance of [phone] kcal/d    Lipids Yes    Intervention Provide education and support for participant on nutrition & aerobic/resistive exercise along with prescribed medications to achieve LDL '70mg'$ , HDL >'40mg'$ .    Expected Outcomes Short Term: Participant states understanding of desired cholesterol values and is compliant with medications prescribed. Participant is following exercise prescription and nutrition guidelines.;Long Term: Cholesterol controlled with medications as prescribed, with individualized exercise RX and with personalized nutrition plan. Value goals: LDL < '70mg'$ , HDL > 40 mg.    Personal Goal Other Yes    Personal Goal Patient would like to start yoga.    Intervention Will show patient Pritikin Introduction to Yoga video.    Expected Outcomes Patient will be able to participate in Yoga.             Core Components/Risk Factors/Patient Goals Review:    Core  Components/Risk Factors/Patient Goals at Discharge (Final Review):    ITP Comments:  ITP Comments     Row Name 05/13/22 0942 05/27/22 1436         ITP Comments Medical Director- Dr. [name], MD, Introduction to Pritikin Education Program/ Intensive Cardiac Rehab. Initial Orientation Packet Reviewed with the patient 30 Day ITP comment. Elaine Le exercise is currently on hold until after her echo and she follows up with Dr Percival Spanish in [place]               Comments: See ITP comments

## 2022-05-28 ENCOUNTER — Ambulatory Visit (HOSPITAL_COMMUNITY): Payer: Medicare Other

## 2022-05-30 ENCOUNTER — Other Ambulatory Visit: Payer: Self-pay

## 2022-05-30 ENCOUNTER — Ambulatory Visit (HOSPITAL_COMMUNITY): Payer: Medicare Other

## 2022-05-30 DIAGNOSIS — Z79899 Other long term (current) drug therapy: Secondary | ICD-10-CM

## 2022-05-30 DIAGNOSIS — E559 Vitamin D deficiency, unspecified: Secondary | ICD-10-CM

## 2022-05-30 MED ORDER — FUROSEMIDE 20 MG PO TABS: 20.0000 mg | ORAL_TABLET | Freq: Every day | ORAL | 3 refills | Status: DC

## 2022-05-30 NOTE — Telephone Encounter (Signed)
Pt walked into clinic today asking if her medication had been sent in from last week on Friday. Per review it looks like that prescription wasn't sent. Prescription sent to pt's pharmacy of choice. Pt will be

## 2022-06-02 ENCOUNTER — Ambulatory Visit (HOSPITAL_COMMUNITY): Payer: Medicare Other

## 2022-06-02 ENCOUNTER — Encounter: Payer: Self-pay | Admitting: *Deleted

## 2022-06-04 ENCOUNTER — Ambulatory Visit: Payer: Medicare Other | Admitting: Cardiology

## 2022-06-06 ENCOUNTER — Ambulatory Visit (HOSPITAL_COMMUNITY): Payer: Medicare Other

## 2022-06-09 ENCOUNTER — Encounter (HOSPITAL_BASED_OUTPATIENT_CLINIC_OR_DEPARTMENT_OTHER): Payer: Self-pay | Admitting: Obstetrics & Gynecology

## 2022-06-09 ENCOUNTER — Ambulatory Visit (INDEPENDENT_AMBULATORY_CARE_PROVIDER_SITE_OTHER): Payer: Medicare Other | Admitting: Obstetrics & Gynecology

## 2022-06-09 ENCOUNTER — Other Ambulatory Visit (HOSPITAL_COMMUNITY)
Admission: RE | Admit: 2022-06-09 | Discharge: 2022-06-09 | Disposition: A | Discharge status: Home / Self Care | Payer: Medicare Other | Source: Ambulatory Visit | Attending: Obstetrics & Gynecology | Admitting: Obstetrics & Gynecology

## 2022-06-09 VITALS — BP 130/89 | HR 84 | Ht 60.0 in | Wt 122.0 lb

## 2022-06-09 DIAGNOSIS — Z13820 Encounter for screening for osteoporosis: Secondary | ICD-10-CM | POA: Diagnosis not present

## 2022-06-09 DIAGNOSIS — Z01419 Encounter for gynecological examination (general) (routine) without abnormal findings: Secondary | ICD-10-CM

## 2022-06-09 DIAGNOSIS — N959 Unspecified menopausal and perimenopausal disorder: Secondary | ICD-10-CM

## 2022-06-09 DIAGNOSIS — Z952 Presence of prosthetic heart valve: Secondary | ICD-10-CM

## 2022-06-09 DIAGNOSIS — Z124 Encounter for screening for malignant neoplasm of cervix: Secondary | ICD-10-CM | POA: Insufficient documentation

## 2022-06-09 LAB — TSH: TSH: 0.823 u[IU]/mL (ref 0.450–4.500)

## 2022-06-09 LAB — BASIC METABOLIC PANEL
BUN/Creatinine Ratio: 15 (ref 12–28)
BUN: 9 mg/dL (ref 8–27)
CO2: 20 mmol/L (ref 20–29)
Calcium: 9 mg/dL (ref 8.7–10.3)
Chloride: 97 mmol/L (ref 96–106)
Creatinine, Ser: 0.59 mg/dL (ref 0.57–1.00)
Glucose: 131 mg/dL — ABNORMAL HIGH (ref 70–99)
Potassium: 3.8 mmol/L (ref 3.5–5.2)
Sodium: 135 mmol/L (ref 134–144)
eGFR: 102 mL/min/{1.73_m2} (ref >59)

## 2022-06-09 LAB — MAGNESIUM: Magnesium: 2 mg/dL (ref 1.6–2.3)

## 2022-06-09 LAB — VITAMIN D 1,25 DIHYDROXY
Vitamin D 1, 25 (OH)2 Total: 82 pg/mL — ABNORMAL HIGH
Vitamin D2 1, 25 (OH)2: 17 pg/mL
Vitamin D3 1, 25 (OH)2: 65 pg/mL

## 2022-06-09 NOTE — Progress Notes (Signed)
62 y.o. G1P1001 Single White or Caucasian female here for annual exam.  Had a bioprosthetic mitral valve replacement done via sternotomy 02/11/2022.  With valve surgery, had heart block.  Then had pacemaker placed about a week later.  On eliquis twice daily.  She may be able to come off of this at some point.  Denies vaginal bleeding.  No LMP recorded. Patient is postmenopausal.          Sexually active: No.  The current method of family planning is status post hysterectomy.    Exercising: started cardiac rehab but had to stop due to some breathing changes, fluid retension Smoker:  no  Health Maintenance: Pap: 05/28/20 neg History of abnormal Pap:  yes MMG:  02/27/21, will do this once released from surgery Colonoscopy:  02/13/2011.  cologuard ordered by Dr. Clayton Bibles.  She reports there weren't enough cells.  Colonoscopy needed.  She reports she will try to do this later this year. BMD:   ordered so she can do this with mammogram Screening Labs: does with Dr. Fara Olden   reports that she quit smoking about 7 years ago. Her smoking use included cigarettes. She has a 17.50 pack-year smoking history. She has never used smokeless tobacco. She reports that she does not drink alcohol and does not use drugs.  Past Medical History:  Diagnosis Date   Acute respiratory failure with hypercapnia ([place]) [id number]   Acute systolic heart failure ([place]) 09/2014   Adenomatous colon polyp    Arthritis    Back pain    Chronic diastolic heart failure ([facility])    related to MR/MS   Chronic pain syndrome    Chronic sinusitis    COPD (chronic obstructive pulmonary disease)-PFT pending  07/17/2014   Diverticulosis    Dyspnea    Fibromyalgia    GERD (gastroesophageal reflux disease)    Headaches, cluster    Heart murmur    Hiatal hernia    Hx of adenomatous colonic polyps 03/10/2011   Oct 2012, repeat colon Oct 2017    Hypothyroidism    Internal hemorrhoids    Irritable bowel syndrome (IBS)    Mitral valve  regurgitation    severe   Mitral valve stenosis, moderate 07/11/2014   Pneumonia    Feb. 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/10/2014   Severe mitral regurgitation with moderate mitral stenosis   S/P minimally invasive mitral valve replacement with bioprosthetic valve 11/08/2014   25 mm [facility] Mitral bovine bioprosthetic tissue valve placed via right mini thoracotomy approach   Sleep apnea    uses CPAP-setting 7   Tobacco abuse     Past Surgical History:  Procedure Laterality Date   CARDIAC CATHETERIZATION     CYSTOSTOMY W/ BLADDER DILATION     at age 62   LEFT AND RIGHT HEART CATHETERIZATION WITH CORONARY ANGIOGRAM N/A 07/27/2014   Procedure: LEFT AND RIGHT HEART CATHETERIZATION WITH CORONARY ANGIOGRAM;  Surgeon: Burnell Blanks, MD;  Location: [facility] CATH LAB;  Right left heart catheterization: Mild/minimal coronary artery disease. RV: 50/7/17, PA: 48/29 (mean 38), PCWP 33 mmHg   MITRAL VALVE REPAIR Right 11/08/2014   Procedure: MINIMALLY INVASIVE MITRAL VALVE REPLACEMENT(MVR);  Surgeon: Rexene Alberts, MD;  Location: [place];  Service: Open Heart Surgery;  Laterality: Right;   MITRAL VALVE REPLACEMENT N/A 02/11/2022   Procedure: REDO MITRAL VALVE REPLACEMENT (MVR) VIA STERNOTOMY USING 27 MM MOSAIC PORCINE MITRAL VALVE;  Surgeon: [name],  MD;  Location: [facility] OR;  Service: Open Heart Surgery;  Laterality: N/A;   NEUROPLASTY / TRANSPOSITION MEDIAN NERVE AT CARPAL TUNNEL BILATERAL     PACEMAKER IMPLANT N/A 02/17/2022   Procedure: PACEMAKER IMPLANT;  Surgeon: Vickie Epley, MD;  Location: [facility] CV LAB;  Service: Cardiovascular;  Laterality: N/A;   RIGHT/LEFT HEART CATH AND CORONARY ANGIOGRAPHY N/A 12/26/2021   Procedure: RIGHT/LEFT HEART CATH AND CORONARY ANGIOGRAPHY;  Surgeon: Sherren Mocha, MD;  Location: [place] CV LAB;  Service: Cardiovascular;  Laterality: N/A;   TEE WITHOUT CARDIOVERSION  N/A 07/10/2014   Procedure: TRANSESOPHAGEAL ECHOCARDIOGRAM (TEE);  Surgeon: Lelon Perla, MD;  Location: [facility] ENDOSCOPY;  Service: Cardiovascular;  55-60%. No regional wall motion modalities. Moderate mitral stenosis with severe regurgitation and moderate to severely dilated left atrium.   TEE WITHOUT CARDIOVERSION N/A 11/08/2014   Procedure: TRANSESOPHAGEAL ECHOCARDIOGRAM (TEE);  Surgeon: Rexene Alberts, MD;  Location: [place];  Service: Open Heart Surgery;  Laterality: N/A;   TEE WITHOUT CARDIOVERSION N/A 06/07/2021   Procedure: TRANSESOPHAGEAL ECHOCARDIOGRAM (TEE);  Surgeon: Sueanne Margarita, MD;  Location: [facility] ENDOSCOPY;  Service: Cardiovascular;  Laterality: N/A;   TEE WITHOUT CARDIOVERSION N/A 02/11/2022   Procedure: TRANSESOPHAGEAL ECHOCARDIOGRAM (TEE);  Surgeon: [name], MD;  Location: [place];  Service: Open Heart Surgery;  Laterality: N/A;   THORACIC OUTLET SURGERY     TRANSTHORACIC ECHOCARDIOGRAM  07/04/2014   Normal LV Size/Fnx - EF 60-65% no RWMA; MV: thickened leaflets with doming, fixed Posterior leaflet, anterior leaflet w/ large/shaggy & mobile density.  c/w Rheumatic Mitral disease - moderate MS & Mod-Severe MR.   TUBAL LIGATION     VULVA SURGERY      Current Outpatient Medications  Medication Sig Dispense Refill   apixaban (ELIQUIS) 5 MG TABS tablet Take 1 tablet (5 mg total) by mouth 2 (two) times daily. 60 tablet 3   clobetasol (TEMOVATE) 0.05 % external solution Apply 1 application  topically daily as needed (scalp irritation.).     estradiol (ESTRACE) 0.1 MG/GM vaginal cream 1 gram vaginally twice weekly 42.5 g 6   ketoconazole (NIZORAL) 2 % shampoo Apply 1 Application topically daily as needed (scalp irritation.).     liothyronine (CYTOMEL) 5 MCG tablet Take 5 mcg by mouth daily.   2   metroNIDAZOLE (METROCREAM) 0.75 % cream Apply 1 application  topically in the morning. Face     SYNTHROID 75 MCG tablet Take 75 mcg by mouth daily before breakfast.      terconazole  (TERAZOL 3) 0.8 % vaginal cream Place 1 applicator vaginally at bedtime as needed.     furosemide (LASIX) 20 MG tablet Take 1 tablet (20 mg total) by mouth daily. For weight gain of 3 lbs in 24 or 5 lbs in 48 hours (Patient not taking: Reported on 06/09/2022) 90 tablet 3   hydrocortisone cream 1 % Apply 1 Application topically as needed for itching. (Patient not taking: Reported on 06/09/2022)     No current facility-administered medications for this visit.    Family History  Problem Relation Age of Onset   Kidney cancer Mother    Colon polyps Mother    Irritable bowel syndrome Mother    Diabetes Sister    Liver disease Sister    Irritable bowel syndrome Daughter    Breast cancer Paternal Aunt    Diabetes Brother    Colon cancer Neg Hx    Thyroid disease Neg Hx     ROS: Constitutional: negative Genitourinary:negative  Exam:   BP 130/89   Pulse 84   Ht 5' (1.524 m)   Wt 122 lb (55.3 kg)   BMI 23.83 kg/m   Height: 5' (152.4 cm)  General appearance: alert, cooperative and appears stated age Head: Normocephalic, without obvious abnormality, atraumatic Neck: no adenopathy, supple, symmetrical, trachea midline and thyroid normal to inspection and palpation Lungs: clear to auscultation bilaterally Breasts: normal appearance, no masses or tenderness, left breast larger than the right Heart: regular rate and rhythm Abdomen: soft, non-tender; bowel sounds normal; no masses,  no organomegaly Extremities: extremities normal, atraumatic, no cyanosis or edema Skin: Skin color, texture, turgor normal. No rashes or lesions Lymph nodes: Cervical, supraclavicular, and axillary nodes normal. No abnormal inguinal nodes palpated Neurologic: Grossly normal   Pelvic: External genitalia:  no lesions              Urethra:  normal appearing urethra with no masses, tenderness or lesions              Bartholins and Skenes: normal                 Vagina: normal appearing vagina with normal color  and no discharge, no lesions              Cervix: no lesions              Pap taken: Yes.   Bimanual Exam:  Uterus:  normal size, contour, position, consistency, mobility, non-tender              Adnexa: normal adnexa and no mass, fullness, tenderness               Rectovaginal: Confirms               Anus:  normal sphincter tone, no lesions  Chaperone, Ezekiel Ina, RN, was present for exam.  Assessment/Plan:

## 2022-06-11 ENCOUNTER — Ambulatory Visit (HOSPITAL_COMMUNITY): Payer: Medicare Other

## 2022-06-11 LAB — CYTOLOGY - PAP: Diagnosis: NEGATIVE

## 2022-06-13 ENCOUNTER — Ambulatory Visit (HOSPITAL_COMMUNITY): Payer: Medicare Other

## 2022-06-16 ENCOUNTER — Ambulatory Visit (HOSPITAL_COMMUNITY): Payer: Medicare Other

## 2022-06-17 ENCOUNTER — Ambulatory Visit
Admission: RE | Admit: 2022-06-17 | Discharge: 2022-06-17 | Disposition: A | Discharge status: Home / Self Care | Payer: Medicare Other | Source: Ambulatory Visit | Attending: Obstetrics & Gynecology | Admitting: Obstetrics & Gynecology

## 2022-06-17 ENCOUNTER — Telehealth: Payer: Self-pay | Admitting: *Deleted

## 2022-06-17 ENCOUNTER — Other Ambulatory Visit: Payer: Self-pay | Admitting: Obstetrics & Gynecology

## 2022-06-17 DIAGNOSIS — Z1231 Encounter for screening mammogram for malignant neoplasm of breast: Secondary | ICD-10-CM

## 2022-06-17 NOTE — Telephone Encounter (Signed)
Patient with diagnosis of afib on Eliquis for anticoagulation.    Procedure: C4-5, C5-6, C6-7 ANTERIOR CERVICAL FUSION  Date of procedure: TBD  CHA2DS2-VASc Score = 3  This indicates a 3.2% annual risk of stroke. The patient's score is based upon: CHF History: 1 HTN History: 0 Diabetes History: 0 Stroke History: 0 Vascular Disease History: 1 Age Score: 0 Gender Score: 1   Redo bioprosthetic MVR 01/2022 with postop afib.  CrCl 64m/min using adjusted body weight Platelet count 312K  Per office protocol, patient can hold Eliquis for 3 days prior to procedure.    **This guidance is not considered finalized until pre-operative APP has relayed final recommendations.**

## 2022-06-17 NOTE — Telephone Encounter (Signed)
   Pre-operative Risk Assessment    Patient Name: Elaine Le  DOB: 07-10-1960 MRN: [id number]      Request for Surgical Clearance    Procedure:   C4-5, C5-6, C6-7 ANTERIOR CERVICAL FUSION  Date of Surgery:  Clearance TBD                                Surgeon:  DR. Kary Kos Surgeon's Group or Practice Name:  [facility] Phone number:  [phone] [place] [address] Fax number:  [phone]   Type of Clearance Requested:   - Medical  - Pharmacy:  Hold Apixaban (Eliquis)     Type of Anesthesia:  General    Additional requests/questions:    Jiles Prows   06/17/2022, 2:22 PM

## 2022-06-18 ENCOUNTER — Ambulatory Visit (HOSPITAL_COMMUNITY): Payer: Medicare Other

## 2022-06-19 ENCOUNTER — Ambulatory Visit (HOSPITAL_COMMUNITY): Payer: Medicare Other | Attending: Cardiology

## 2022-06-19 DIAGNOSIS — Z952 Presence of prosthetic heart valve: Secondary | ICD-10-CM | POA: Diagnosis present

## 2022-06-19 LAB — ECHOCARDIOGRAM COMPLETE
AV Mean grad: 14.6 mmHg
AV Peak grad: 24.9 mmHg
Ao pk vel: 2.49 m/s
Area-P 1/2: 4.31 cm2
P 1/2 time: 286 msec
S' Lateral: 3.2 cm

## 2022-06-20 ENCOUNTER — Ambulatory Visit (HOSPITAL_COMMUNITY): Payer: Medicare Other

## 2022-06-20 ENCOUNTER — Encounter (HOSPITAL_COMMUNITY): Payer: Self-pay | Admitting: *Deleted

## 2022-06-20 DIAGNOSIS — Z952 Presence of prosthetic heart valve: Secondary | ICD-10-CM

## 2022-06-20 NOTE — Progress Notes (Signed)
Cardiac Individual Treatment Plan  Patient Details  Name: Elaine Le MRN: [id number] Date of Birth: [birth year] Referring Provider:   [place] from 05/13/2022 in [facility] for Heart, Vascular, & [place]  Referring Provider Minus Breeding, MD       Initial Encounter Date:  [place] from 05/13/2022 in [facility] for [facility], Vascular, & [facility]  Date 05/13/22       Visit Diagnosis: 02/11/22 S/P MVR , 02/17/22 S/P Pacemaker  Patient's Home Medications on Admission:  Current Outpatient Medications:    apixaban (ELIQUIS) 5 MG TABS tablet, Take 1 tablet (5 mg total) by mouth 2 (two) times daily., Disp: 60 tablet, Rfl: 3   clobetasol (TEMOVATE) 0.05 % external solution, Apply 1 application  topically daily as needed (scalp irritation.)., Disp: , Rfl:    estradiol (ESTRACE) 0.1 MG/GM vaginal cream, 1 gram vaginally twice weekly, Disp: 42.5 g, Rfl: 6   furosemide (LASIX) 20 MG tablet, Take 1 tablet (20 mg total) by mouth daily. For weight gain of 3 lbs in 24 or 5 lbs in 48 hours (Patient not taking: Reported on 06/09/2022), Disp: 90 tablet, Rfl: 3   hydrocortisone cream 1 %, Apply 1 Application topically as needed for itching. (Patient not taking: Reported on 06/09/2022), Disp: , Rfl:    ketoconazole (NIZORAL) 2 % shampoo, Apply 1 Application topically daily as needed (scalp irritation.)., Disp: , Rfl:    liothyronine (CYTOMEL) 5 MCG tablet, Take 5 mcg by mouth daily. , Disp: , Rfl: 2   metroNIDAZOLE (METROCREAM) 0.75 % cream, Apply 1 application  topically in the morning. Face, Disp: , Rfl:    SYNTHROID 75 MCG tablet, Take 75 mcg by mouth daily before breakfast. , Disp: , Rfl:   Past Medical History: Past Medical History:  Diagnosis Date   Acute respiratory failure with hypercapnia ([place]) [id number]   Acute systolic heart failure ([place]) 09/2014    Adenomatous colon polyp    Arthritis    Back pain    Chronic diastolic heart failure (HCC)    related to MR/MS   Chronic pain syndrome    Chronic sinusitis    COPD (chronic obstructive pulmonary disease)-PFT pending  07/17/2014   Diverticulosis    Dyspnea    Fibromyalgia    GERD (gastroesophageal reflux disease)    Headaches, cluster    Heart murmur    Hiatal hernia    Hx of adenomatous colonic polyps 03/10/2011   Oct 2012, repeat colon Oct 2017    Hypothyroidism    Internal hemorrhoids    Irritable bowel syndrome (IBS)    Mitral valve regurgitation    severe   Mitral valve stenosis, moderate 07/11/2014   Pneumonia    Feb. 24, 2016   Protein-calorie malnutrition, severe ([place]) 07/06/2014   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/10/2014   Severe mitral regurgitation with moderate mitral stenosis   S/P minimally invasive mitral valve replacement with bioprosthetic valve 11/08/2014   25 mm [facility] Mitral bovine bioprosthetic tissue valve placed via right mini thoracotomy approach   Sleep apnea    uses CPAP-setting 7   Tobacco abuse     Tobacco Use: Social History   Tobacco Use  Smoking Status Former   Packs/day: 0.50   Years: 35.00   Total pack years: 17.50   Types: Cigarettes   Quit date: 06/17/2014   Years since quitting: 8.0  Smokeless Tobacco Never  Labs: Review Flowsheet  More data exists      Latest Ref Rng & Units 12/26/2021 02/07/2022 02/11/2022 02/12/2022 02/13/2022  Labs for ITP Cardiac and Pulmonary Rehab  Hemoglobin A1c 4.8 - 5.6 % - 5.0  - - -  PH, Arterial 7.35 - 7.45 7.386  7.47  7.319  7.293  7.305  7.308  7.435  7.483  7.363  7.417  7.478  7.429  7.396  -  PCO2 arterial 32 - 48 mmHg 36.6  33  39.8  42.1  43.3  40.6  36.7  35.4  46.0  42.9  37.3  40.0  36.9  -  Bicarbonate 20.0 - 28.0 mmol/L 22.0  23.2  24.0  20.7  20.7  21.6  20.6  24.6  26.5  26.1  27.7  26.3  27.7  26.5  22.8  -  TCO2 22 - 32 mmol/L 23  24  - '22  22  23  22  26  26  26  28   27  28  26  29  28  27  29  24  28  27  24  '$ -  Acid-base deficit 0.0 - 2.0 mmol/L 3.0  2.0  - 5.0  6.0  4.0  6.0  2.0  -  O2 Saturation % 93  68  98.6  98  66.7  99  74.5  98  92  100  100  100  100  77  100  100  71.8  68.6  71.8  99  66     Capillary Blood Glucose: Lab Results  Component Value Date   GLUCAP 99 02/19/2022   GLUCAP 107 (H) 02/19/2022   GLUCAP 105 (H) 02/18/2022   GLUCAP 128 (H) 02/18/2022   GLUCAP 132 (H) 02/18/2022     Exercise Target Goals: Exercise Program Goal: Individual exercise prescription set using results from initial 6 min walk test and THRR while considering  patient's activity barriers and safety.   Exercise Prescription Goal: Initial exercise prescription builds to 30-45 minutes a day of aerobic activity, 2-3 days per week.  Home exercise guidelines will be given to patient during program as part of exercise prescription that the participant will acknowledge.  Activity Barriers & Risk Stratification:  Activity Barriers & Cardiac Risk Stratification - 05/13/22 1131       Activity Barriers & Cardiac Risk Stratification   Activity Barriers Other (comment)    Comments Patient has some right sided numbness/pain from neck to right fingers that she is being evaluated for.    Cardiac Risk Stratification Moderate             6 Minute Walk:  6 Minute Walk     Row Name 05/13/22 1021         6 Minute Walk   Phase Initial     Distance 1863 feet     Walk Time 6 minutes     # of Rest Breaks 0     MPH 3.53     METS 4.69     RPE 7     Perceived Dyspnea  0     VO2 Peak 16.42     Symptoms No     Resting HR 81 bpm     Resting BP 124/62     Resting Oxygen Saturation  97 %     Exercise Oxygen Saturation  during 6 min walk --  Unable to obtain, cold fingers     Max Ex. HR  116 bpm     Max Ex. BP 142/52     2 Minute Post BP 142/60              Oxygen Initial Assessment:   Oxygen Re-Evaluation:   Oxygen Discharge (Final Oxygen  Re-Evaluation):   Initial Exercise Prescription:  Initial Exercise Prescription - 05/13/22 1100       Date of Initial Exercise RX and Referring Provider   Date 05/13/22    Referring Provider Minus Breeding, MD    Expected Discharge Date 07/11/22      Recumbant Bike   Level 1    Watts 45    Minutes 15    METs 4.6      Recumbant Elliptical   Level 1    Watts 60    Minutes 15    METs 4.5      Prescription Details   Frequency (times per week) 3    Duration Progress to 30 minutes of continuous aerobic without signs/symptoms of physical distress      Intensity   THRR 40-80% of Max Heartrate 64-127    Ratings of Perceived Exertion 11-13    Perceived Dyspnea 0-4      Progression   Progression Continue to progress workloads to maintain intensity without signs/symptoms of physical distress.      Resistance Training   Training Prescription Yes    Weight 2 lbs    Reps 10-15             Perform Capillary Blood Glucose checks as needed.  Exercise Prescription Changes:   Exercise Comments:   Exercise Comments     Row Name 05/27/22 1451           Exercise Comments Delayed start, patient on medical hold.                Exercise Goals and Review:   Exercise Goals     Row Name 05/13/22 1032             Exercise Goals   Increase Physical Activity Yes       Intervention Provide advice, education, support and counseling about physical activity/exercise needs.;Develop an individualized exercise prescription for aerobic and resistive training based on initial evaluation findings, risk stratification, comorbidities and participant's personal goals.       Expected Outcomes Short Term: Attend rehab on a regular basis to increase amount of physical activity.;Long Term: Exercising regularly at least 3-5 days a week.;Long Term: Add in home exercise to make exercise part of routine and to increase amount of physical activity.       Increase Strength and Stamina  Yes       Intervention Provide advice, education, support and counseling about physical activity/exercise needs.;Develop an individualized exercise prescription for aerobic and resistive training based on initial evaluation findings, risk stratification, comorbidities and participant's personal goals.       Expected Outcomes Short Term: Increase workloads from initial exercise prescription for resistance, speed, and METs.;Short Term: Perform resistance training exercises routinely during rehab and add in resistance training at home;Long Term: Improve cardiorespiratory fitness, muscular endurance and strength as measured by increased METs and functional capacity (6MWT)       Able to understand and use rate of perceived exertion (RPE) scale Yes       Intervention Provide education and explanation on how to use RPE scale       Expected Outcomes Short Term: Able to use RPE daily in rehab to express subjective intensity  level;Long Term:  Able to use RPE to guide intensity level when exercising independently       Knowledge and understanding of Target Heart Rate Range (THRR) Yes       Intervention Provide education and explanation of THRR including how the numbers were predicted and where they are located for reference       Expected Outcomes Short Term: Able to state/look up THRR;Short Term: Able to use daily as guideline for intensity in rehab;Long Term: Able to use THRR to govern intensity when exercising independently       Able to check pulse independently Yes       Intervention Provide education and demonstration on how to check pulse in carotid and radial arteries.;Review the importance of being able to check your own pulse for safety during independent exercise       Expected Outcomes Long Term: Able to check pulse independently and accurately;Short Term: Able to explain why pulse checking is important during independent exercise       Understanding of Exercise Prescription Yes       Intervention  Provide education, explanation, and written materials on patient's individual exercise prescription       Expected Outcomes Short Term: Able to explain program exercise prescription;Long Term: Able to explain home exercise prescription to exercise independently                Exercise Goals Re-Evaluation :   Discharge Exercise Prescription (Final Exercise Prescription Changes):   Nutrition:  Target Goals: Understanding of nutrition guidelines, daily intake of sodium '1500mg'$ , cholesterol '200mg'$ , calories 30% from fat and 7% or less from saturated fats, daily to have 5 or more servings of fruits and vegetables.  Biometrics:  Pre Biometrics - 05/13/22 0942       Pre Biometrics   Waist Circumference 32.25 inches    Hip Circumference 37.5 inches    Waist to Hip Ratio 0.86 %    Triceps Skinfold 16 mm    % Body Fat 32.7 %    Grip Strength 22 kg    Flexibility 20.25 in    Single Leg Stand 26.5 seconds              Nutrition Therapy Plan and Nutrition Goals:   Nutrition Assessments:  MEDIFICTS Score Key: ?70 Need to make dietary changes  40-70 Heart Healthy Diet ? 40 Therapeutic Level Cholesterol Diet    Picture Your Plate Scores: [id number] Unhealthy dietary pattern with much room for improvement. 41-50 Dietary pattern unlikely to meet recommendations for good health and room for improvement. 51-60 More healthful dietary pattern, with some room for improvement.  >60 Healthy dietary pattern, although there may be some specific behaviors that could be improved.    Nutrition Goals Re-Evaluation:   Nutrition Goals Re-Evaluation:   Nutrition Goals Discharge (Final Nutrition Goals Re-Evaluation):   Psychosocial: Target Goals: Acknowledge presence or absence of significant depression and/or stress, maximize coping skills, provide positive support system. Participant is able to verbalize types and ability to use techniques and skills needed for reducing stress and  depression.  Initial Review & Psychosocial Screening:  Initial Psych Review & Screening - 05/13/22 1221       Initial Review   Current issues with None Identified      Family Dynamics   Good Support System? Yes   Jackelyn Poling has has daughter friends in the neighborhod , sister and Mom for support. [name] lives alone with her pets.     Barriers  Psychosocial barriers to participate in program There are no identifiable barriers or psychosocial needs.      Screening Interventions   Interventions Encouraged to exercise             Quality of Life Scores:  Quality of Life - 05/13/22 1141       Quality of Life   Select Quality of Life      Quality of Life Scores   Health/Function Pre 27.57 %    Socioeconomic Pre 28 %    Psych/Spiritual Pre 27.21 %    Family Pre 28.13 %    GLOBAL Pre 27.66 %            Scores of 19 and below usually indicate a poorer quality of life in these areas.  A difference of  2-3 points is a clinically meaningful difference.  A difference of 2-3 points in the total score of the Quality of Life Index has been associated with significant improvement in overall quality of life, self-image, physical symptoms, and general health in studies assessing change in quality of life.  PHQ-9: Review Flowsheet  More data exists      06/09/2022 05/13/2022 06/18/2021 06/26/2020 10/08/2015  Depression screen PHQ 2/9  Decreased Interest 0 0 0 0 0 0  Down, Depressed, Hopeless 0 0 0 1 0 0  PHQ - 2 Score 0 0 0 1 0 0  Altered sleeping - 0 - - -  Tired, decreased energy - 0 - - -  Change in appetite - 0 - - -  Feeling bad or failure about yourself  - 0 - - -  Trouble concentrating - 0 - - -  Moving slowly or fidgety/restless - 0 - - -  Suicidal thoughts - 0 - - -  PHQ-9 Score - 0 - - -  Difficult doing work/chores - Not difficult at all - - -   Interpretation of Total Score  Total Score Depression Severity:  1-4 = Minimal depression, 5-9 = Mild depression, 10-14 =  Moderate depression, 15-19 = Moderately severe depression, 20-27 = Severe depression   Psychosocial Evaluation and Intervention:   Psychosocial Re-Evaluation:   Psychosocial Discharge (Final Psychosocial Re-Evaluation):   Vocational Rehabilitation: Provide vocational rehab assistance to qualifying candidates.   Vocational Rehab Evaluation & Intervention:  Vocational Rehab - 05/13/22 1120       Initial Vocational Rehab Evaluation & Intervention   Assessment shows need for Vocational Rehabilitation No   Jackelyn Poling is disabled and does not need vocational rehab at this time.            Education: Education Goals: Education classes will be provided on a weekly basis, covering required topics. Participant will state understanding/return demonstration of topics presented.     Core Videos: Exercise    Move It!  Clinical staff conducted group or individual video education with verbal and written material and guidebook.  Patient learns the recommended Pritikin exercise program. Exercise with the goal of living a long, healthy life. Some of the health benefits of exercise include controlled diabetes, healthier blood pressure levels, improved cholesterol levels, improved heart and lung capacity, improved sleep, and better body composition. Everyone should speak with their doctor before starting or changing an exercise routine.  Biomechanical Limitations Clinical staff conducted group or individual video education with verbal and written material and guidebook.  Patient learns how biomechanical limitations can impact exercise and how we can mitigate and possibly overcome limitations to have an impactful and balanced exercise routine.  Body Composition Clinical staff conducted group or individual video education with verbal and written material and guidebook.  Patient learns that body composition (ratio of muscle mass to fat mass) is a key component to assessing overall fitness, rather  than body weight alone. Increased fat mass, especially visceral belly fat, can put [place] at increased risk for metabolic syndrome, type 2 diabetes, heart disease, and even death. It is recommended to combine diet and exercise (cardiovascular and resistance training) to improve your body composition. Seek guidance from your physician and exercise physiologist before implementing an exercise routine.  Exercise Action Plan Clinical staff conducted group or individual video education with verbal and written material and guidebook.  Patient learns the recommended strategies to achieve and enjoy long-term exercise adherence, including variety, self-motivation, self-efficacy, and positive decision making. Benefits of exercise include fitness, good health, weight management, more energy, better sleep, less stress, and overall well-being.  Medical   Heart Disease Risk Reduction Clinical staff conducted group or individual video education with verbal and written material and guidebook.  Patient learns our heart is our most vital organ as it circulates oxygen, nutrients, white blood cells, and hormones throughout the entire body, and carries waste away. Data supports a plant-based eating plan like the Pritikin Program for its effectiveness in slowing progression of and reversing heart disease. The video provides a number of recommendations to address heart disease.   Metabolic Syndrome and Belly Fat  Clinical staff conducted group or individual video education with verbal and written material and guidebook.  Patient learns what metabolic syndrome is, how it leads to heart disease, and how one can reverse it and keep it from coming back. You have metabolic syndrome if you have 3 of the following 5 criteria: abdominal obesity, high blood pressure, high triglycerides, low HDL cholesterol, and high blood sugar.  Hypertension and Heart Disease Clinical staff conducted group or individual video education with verbal and  written material and guidebook.  Patient learns that high blood pressure, or hypertension, is very common in the [place]. Hypertension is largely due to excessive salt intake, but other important risk factors include being overweight, physical inactivity, drinking too much alcohol, smoking, and not eating enough potassium from fruits and vegetables. High blood pressure is a leading risk factor for heart attack, stroke, congestive heart failure, dementia, kidney failure, and premature death. Long-term effects of excessive salt intake include stiffening of the arteries and thickening of heart muscle and organ damage. Recommendations include ways to reduce hypertension and the risk of heart disease.  Diseases of Our Time - Focusing on Diabetes Clinical staff conducted group or individual video education with verbal and written material and guidebook.  Patient learns why the best way to stop diseases of our time is prevention, through food and other lifestyle changes. Medicine (such as prescription pills and surgeries) is often only a Band-Aid on the problem, not a long-term solution. Most common diseases of our time include obesity, type 2 diabetes, hypertension, heart disease, and cancer. The Pritikin Program is recommended and has been proven to help reduce, reverse, and/or prevent the damaging effects of metabolic syndrome.  Nutrition   Overview of the Pritikin Eating Plan  Clinical staff conducted group or individual video education with verbal and written material and guidebook.  Patient learns about the [place] for disease risk reduction. The [place] emphasizes a wide variety of unrefined, minimally-processed carbohydrates, like fruits, vegetables, whole grains, and legumes. Go, Caution, and Stop food choices are  explained. Plant-based and lean animal proteins are emphasized. Rationale provided for low sodium intake for blood pressure control, low added sugars for blood  sugar stabilization, and low added fats and oils for coronary artery disease risk reduction and weight management.  Calorie Density  Clinical staff conducted group or individual video education with verbal and written material and guidebook.  Patient learns about calorie density and how it impacts the Pritikin Eating Plan. Knowing the characteristics of the food you choose will help you decide whether those foods will lead to weight gain or weight loss, and whether you want to consume more or less of them. Weight loss is usually a side effect of the Pritikin Eating Plan because of its focus on low calorie-dense foods.  Label Reading  Clinical staff conducted group or individual video education with verbal and written material and guidebook.  Patient learns about the Pritikin recommended label reading guidelines and corresponding recommendations regarding calorie density, added sugars, sodium content, and whole grains.  Dining Out - Part 1  Clinical staff conducted group or individual video education with verbal and written material and guidebook.  Patient learns that restaurant meals can be sabotaging because they can be so high in calories, fat, sodium, and/or sugar. Patient learns recommended strategies on how to positively address this and avoid unhealthy pitfalls.  Facts on Fats  Clinical staff conducted group or individual video education with verbal and written material and guidebook.  Patient learns that lifestyle modifications can be just as effective, if not more so, as many medications for lowering your risk of heart disease. A Pritikin lifestyle can help to reduce your risk of inflammation and atherosclerosis (cholesterol build-up, or plaque, in the artery walls). Lifestyle interventions such as dietary choices and physical activity address the cause of atherosclerosis. A review of the types of fats and their impact on blood cholesterol levels, along with dietary recommendations to reduce  fat intake is also included.  Nutrition Action Plan  Clinical staff conducted group or individual video education with verbal and written material and guidebook.  Patient learns how to incorporate Pritikin recommendations into their lifestyle. Recommendations include planning and keeping personal health goals in mind as an important part of their success.  Healthy Mind-Set    Healthy Minds, Bodies, Hearts  Clinical staff conducted group or individual video education with verbal and written material and guidebook.  Patient learns how to identify when they are stressed. Video will discuss the impact of that stress, as well as the many benefits of stress management. Patient will also be introduced to stress management techniques. The way we think, act, and feel has an impact on our hearts.  How Our Thoughts Can Heal Our Hearts  Clinical staff conducted group or individual video education with verbal and written material and guidebook.  Patient learns that negative thoughts can cause depression and anxiety. This can result in negative lifestyle behavior and serious health problems. Cognitive behavioral therapy is an effective method to help control our thoughts in order to change and improve our emotional outlook.  Additional Videos:  Exercise    Improving Performance  Clinical staff conducted group or individual video education with verbal and written material and guidebook.  Patient learns to use a non-linear approach by alternating intensity levels and lengths of time spent exercising to help burn more calories and lose more body fat. Cardiovascular exercise helps improve heart health, metabolism, hormonal balance, blood sugar control, and recovery from fatigue. Resistance training improves strength, endurance, balance, coordination,  reaction time, metabolism, and muscle mass. Flexibility exercise improves circulation, posture, and balance. Seek guidance from your physician and exercise  physiologist before implementing an exercise routine and learn your capabilities and proper form for all exercise.  Introduction to Yoga  Clinical staff conducted group or individual video education with verbal and written material and guidebook.  Patient learns about yoga, a discipline of the coming together of mind, breath, and body. The benefits of yoga include improved flexibility, improved range of motion, better posture and core strength, increased lung function, weight loss, and positive self-image. Yoga's heart health benefits include lowered blood pressure, healthier heart rate, decreased cholesterol and triglyceride levels, improved immune function, and reduced stress. Seek guidance from your physician and exercise physiologist before implementing an exercise routine and learn your capabilities and proper form for all exercise.  Medical   Aging: Enhancing Your Quality of Life  Clinical staff conducted group or individual video education with verbal and written material and guidebook.  Patient learns key strategies and recommendations to stay in good physical health and enhance quality of life, such as prevention strategies, having an advocate, securing a [place], and keeping a list of medications and system for tracking them. It also discusses how to avoid risk for bone loss.  Biology of Weight Control  Clinical staff conducted group or individual video education with verbal and written material and guidebook.  Patient learns that weight gain occurs because we consume more calories than we burn (eating more, moving less). Even if your body weight is normal, you may have higher ratios of fat compared to muscle mass. Too much body fat puts you at increased risk for cardiovascular disease, heart attack, stroke, type 2 diabetes, and obesity-related cancers. In addition to exercise, following the [employer] can help reduce your risk.  Decoding Lab Results   Clinical staff conducted group or individual video education with verbal and written material and guidebook.  Patient learns that lab test reflects one measurement whose values change over time and are influenced by many factors, including medication, stress, sleep, exercise, food, hydration, pre-existing medical conditions, and more. It is recommended to use the knowledge from this video to become more involved with your lab results and evaluate your numbers to speak with your doctor.   Diseases of Our Time - Overview  Clinical staff conducted group or individual video education with verbal and written material and guidebook.  Patient learns that according to the CDC, 50% to 70% of chronic diseases (such as obesity, type 2 diabetes, elevated lipids, hypertension, and heart disease) are avoidable through lifestyle improvements including healthier food choices, listening to satiety cues, and increased physical activity.  Sleep Disorders Clinical staff conducted group or individual video education with verbal and written material and guidebook.  Patient learns how good quality and duration of sleep are important to overall health and well-being. Patient also learns about sleep disorders and how they impact health along with recommendations to address them, including discussing with a physician.  Nutrition  Dining Out - Part 2 Clinical staff conducted group or individual video education with verbal and written material and guidebook.  Patient learns how to plan ahead and communicate in order to maximize their dining experience in a healthy and nutritious manner. Included are recommended food choices based on the type of restaurant the patient is visiting.   Fueling a Best boy conducted group or individual video education with verbal and written material  and guidebook.  There is a strong connection between our food choices and our health. Diseases like obesity and type 2 diabetes  are very prevalent and are in large-part due to lifestyle choices. The Pritikin Eating Plan provides plenty of food and hunger-curbing satisfaction. It is easy to follow, affordable, and helps reduce health risks.  Menu Workshop  Clinical staff conducted group or individual video education with verbal and written material and guidebook.  Patient learns that restaurant meals can sabotage health goals because they are often packed with calories, fat, sodium, and sugar. Recommendations include strategies to plan ahead and to communicate with the manager, chef, or server to help order a healthier meal.  Planning Your Eating Strategy  Clinical staff conducted group or individual video education with verbal and written material and guidebook.  Patient learns about the [place] and its benefit of reducing the risk of disease. The [place] does not focus on calories. Instead, it emphasizes high-quality, nutrient-rich foods. By knowing the characteristics of the foods, we choose, we can determine their calorie density and make informed decisions.  Targeting Your Nutrition Priorities  Clinical staff conducted group or individual video education with verbal and written material and guidebook.  Patient learns that lifestyle habits have a tremendous impact on disease risk and progression. This video provides eating and physical activity recommendations based on your personal health goals, such as reducing LDL cholesterol, losing weight, preventing or controlling type 2 diabetes, and reducing high blood pressure.  Vitamins and Minerals  Clinical staff conducted group or individual video education with verbal and written material and guidebook.  Patient learns different ways to obtain key vitamins and minerals, including through a recommended healthy diet. It is important to discuss all supplements you take with your doctor.   Healthy Mind-Set    Smoking Cessation  Clinical staff  conducted group or individual video education with verbal and written material and guidebook.  Patient learns that cigarette smoking and tobacco addiction pose a serious health risk which affects millions of people. Stopping smoking will significantly reduce the risk of heart disease, lung disease, and many forms of cancer. Recommended strategies for quitting are covered, including working with your doctor to develop a successful plan.  Culinary   Becoming a Financial trader conducted group or individual video education with verbal and written material and guidebook.  Patient learns that cooking at home can be healthy, cost-effective, quick, and puts them in control. Keys to cooking healthy recipes will include looking at your recipe, assessing your equipment needs, planning ahead, making it simple, choosing cost-effective seasonal ingredients, and limiting the use of added fats, salts, and sugars.  Cooking - Breakfast and Snacks  Clinical staff conducted group or individual video education with verbal and written material and guidebook.  Patient learns how important breakfast is to satiety and nutrition through the entire day. Recommendations include key foods to eat during breakfast to help stabilize blood sugar levels and to prevent overeating at meals later in the day. Planning ahead is also a key component.  Cooking - Human resources officer conducted group or individual video education with verbal and written material and guidebook.  Patient learns eating strategies to improve overall health, including an approach to cook more at home. Recommendations include thinking of animal protein as a side on your plate rather than center stage and focusing instead on lower calorie dense options like vegetables, fruits, whole grains, and plant-based proteins, such as  beans. Making sauces in large quantities to freeze for later and leaving the skin on your vegetables are also  recommended to maximize your experience.  Cooking - Healthy Salads and Dressing Clinical staff conducted group or individual video education with verbal and written material and guidebook.  Patient learns that vegetables, fruits, whole grains, and legumes are the foundations of the [place]. Recommendations include how to incorporate each of these in flavorful and healthy salads, and how to create homemade salad dressings. Proper handling of ingredients is also covered. Cooking - Soups and [employer] - Soups and Desserts Clinical staff conducted group or individual video education with verbal and written material and guidebook.  Patient learns that Pritikin soups and desserts make for easy, nutritious, and delicious snacks and meal components that are low in sodium, fat, sugar, and calorie density, while high in vitamins, minerals, and filling fiber. Recommendations include simple and healthy ideas for soups and desserts.   Overview     The Pritikin Solution Program Overview Clinical staff conducted group or individual video education with verbal and written material and guidebook.  Patient learns that the results of the [place] Program have been documented in more than 100 articles published in peer-reviewed journals, and the benefits include reducing risk factors for (and, in some cases, even reversing) high cholesterol, high blood pressure, type 2 diabetes, obesity, and more! An overview of the three key pillars of the Pritikin Program will be covered: eating well, doing regular exercise, and having a healthy mind-set.  WORKSHOPS  Exercise: Exercise Basics: Building Your Action Plan Clinical staff led group instruction and group discussion with PowerPoint presentation and patient guidebook. To enhance the learning environment the use of posters, models and videos may be added. At the conclusion of this workshop, patients will comprehend the difference between physical  activity and exercise, as well as the benefits of incorporating both, into their routine. Patients will understand the FITT (Frequency, Intensity, Time, and Type) principle and how to use it to build an exercise action plan. In addition, safety concerns and other considerations for exercise and cardiac rehab will be addressed by the presenter. The purpose of this lesson is to promote a comprehensive and effective weekly exercise routine in order to improve patients' overall level of fitness.   Managing Heart Disease: Your Path to a Healthier Heart Clinical staff led group instruction and group discussion with PowerPoint presentation and patient guidebook. To enhance the learning environment the use of posters, models and videos may be added.At the conclusion of this workshop, patients will understand the anatomy and physiology of the heart. Additionally, they will understand how Pritikin's three pillars impact the risk factors, the progression, and the management of heart disease.  The purpose of this lesson is to provide a high-level overview of the heart, heart disease, and how the Pritikin lifestyle positively impacts risk factors.  Exercise Biomechanics Clinical staff led group instruction and group discussion with PowerPoint presentation and patient guidebook. To enhance the learning environment the use of posters, models and videos may be added. Patients will learn how the structural parts of their bodies function and how these functions impact their daily activities, movement, and exercise. Patients will learn how to promote a neutral spine, learn how to manage pain, and identify ways to improve their physical movement in order to promote healthy living. The purpose of this lesson is to expose patients to common physical limitations that impact physical activity. Participants will learn practical ways to adapt  and manage aches and pains, and to minimize their effect on regular exercise.  Patients will learn how to maintain good posture while sitting, walking, and lifting.  Balance Training and Fall Prevention  Clinical staff led group instruction and group discussion with PowerPoint presentation and patient guidebook. To enhance the learning environment the use of posters, models and videos may be added. At the conclusion of this workshop, patients will understand the importance of their sensorimotor skills (vision, proprioception, and the vestibular system) in maintaining their ability to balance as they age. Patients will apply a variety of balancing exercises that are appropriate for their current level of function. Patients will understand the common causes for poor balance, possible solutions to these problems, and ways to modify their physical environment in order to minimize their fall risk. The purpose of this lesson is to teach patients about the importance of maintaining balance as they age and ways to minimize their risk of falling.  WORKSHOPS   Nutrition:  Fueling a Scientist, research (physical sciences) led group instruction and group discussion with PowerPoint presentation and patient guidebook. To enhance the learning environment the use of posters, models and videos may be added. Patients will review the foundational principles of the [place] and understand what constitutes a serving size in each of the food groups. Patients will also learn Pritikin-friendly foods that are better choices when away from home and review make-ahead meal and snack options. Calorie density will be reviewed and applied to three nutrition priorities: weight maintenance, weight loss, and weight gain. The purpose of this lesson is to reinforce (in a group setting) the key concepts around what patients are recommended to eat and how to apply these guidelines when away from home by planning and selecting Pritikin-friendly options. Patients will understand how calorie density may be adjusted for  different weight management goals.  Mindful Eating  Clinical staff led group instruction and group discussion with PowerPoint presentation and patient guidebook. To enhance the learning environment the use of posters, models and videos may be added. Patients will briefly review the concepts of the [place] and the importance of low-calorie dense foods. The concept of mindful eating will be introduced as well as the importance of paying attention to internal hunger signals. Triggers for non-hunger eating and techniques for dealing with triggers will be explored. The purpose of this lesson is to provide patients with the opportunity to review the basic principles of the [place], discuss the value of eating mindfully and how to measure internal cues of hunger and fullness using the Hunger Scale. Patients will also discuss reasons for non-hunger eating and learn strategies to use for controlling emotional eating.  Targeting Your Nutrition Priorities Clinical staff led group instruction and group discussion with PowerPoint presentation and patient guidebook. To enhance the learning environment the use of posters, models and videos may be added. Patients will learn how to determine their genetic susceptibility to disease by reviewing their family history. Patients will gain insight into the importance of diet as part of an overall healthy lifestyle in mitigating the impact of genetics and other environmental insults. The purpose of this lesson is to provide patients with the opportunity to assess their personal nutrition priorities by looking at their family history, their own health history and current risk factors. Patients will also be able to discuss ways of prioritizing and modifying the [place] for their highest risk areas  Menu  Clinical staff led group instruction and  group discussion with PowerPoint presentation and patient guidebook. To enhance the learning  environment the use of posters, models and videos may be added. Using menus brought in from [employer], or printed from [employer], patients will apply the [place] dining out guidelines that were presented in the [employer] video. Patients will also be able to practice these guidelines in a variety of provided scenarios. The purpose of this lesson is to provide patients with the opportunity to practice hands-on learning of the [place] with actual menus and practice scenarios.  Label Reading Clinical staff led group instruction and group discussion with PowerPoint presentation and patient guidebook. To enhance the learning environment the use of posters, models and videos may be added. Patients will review and discuss the Pritikin label reading guidelines presented in Pritikin's Label Reading Educational series video. Using fool labels brought in from local grocery stores and markets, patients will apply the label reading guidelines and determine if the packaged food meet the Pritikin guidelines. The purpose of this lesson is to provide patients with the opportunity to review, discuss, and practice hands-on learning of the Pritikin Label Reading guidelines with actual packaged food labels. [place] Workshops are designed to teach patients ways to prepare quick, simple, and affordable recipes at home. The importance of nutrition's role in chronic disease risk reduction is reflected in its emphasis in the overall Pritikin program. By learning how to prepare essential core Pritikin Eating Plan recipes, patients will increase control over what they eat; be able to customize the flavor of foods without the use of added salt, sugar, or fat; and improve the quality of the food they consume. By learning a set of core recipes which are easily assembled, quickly prepared, and affordable, patients are more likely to prepare more healthy  foods at home. These workshops focus on convenient breakfasts, simple entres, side dishes, and desserts which can be prepared with minimal effort and are consistent with nutrition recommendations for cardiovascular risk reduction. Cooking [employer] are taught by a Engineer, materials (RD) who has been trained by the [employer]. The chef or RD has a clear understanding of the importance of minimizing - if not completely eliminating - added fat, sugar, and sodium in recipes. Throughout the series of [place] Workshop sessions, patients will learn about healthy ingredients and efficient methods of cooking to build confidence in their capability to prepare    Cooking School weekly topics:  Adding Flavor- Sodium-Free  Fast and Healthy Breakfasts  Powerhouse Plant-Based Proteins  Satisfying Salads and Dressings  Simple Sides and Sauces  International Cuisine-Spotlight on the [place] Zones  Delicious Desserts  Savory Soups  Efficiency Cooking - Meals in a Snap  Tasty Appetizers and Snacks  Comforting Weekend Breakfasts  One-Pot Wonders   Fast Evening Meals  Easy [place] (Psychosocial): New Thoughts, New Behaviors Clinical staff led group instruction and group discussion with PowerPoint presentation and patient guidebook. To enhance the learning environment the use of posters, models and videos may be added. Patients will learn and practice techniques for developing effective health and lifestyle goals. Patients will be able to effectively apply the goal setting process learned to develop at least one new personal goal.  The purpose of this lesson is to expose patients to a new skill set of behavior modification techniques such as techniques setting SMART goals, overcoming barriers, and  achieving new thoughts and new behaviors.  Managing Moods and Relationships Clinical staff led group  instruction and group discussion with PowerPoint presentation and patient guidebook. To enhance the learning environment the use of posters, models and videos may be added. Patients will learn how emotional and chronic stress factors can impact their health and relationships. They will learn healthy ways to manage their moods and utilize positive coping mechanisms. In addition, ICR patients will learn ways to improve communication skills. The purpose of this lesson is to expose patients to ways of understanding how one's mood and health are intimately connected. Developing a healthy outlook can help build positive relationships and connections with others. Patients will understand the importance of utilizing effective communication skills that include actively listening and being heard. They will learn and understand the importance of the "4 Cs" and especially Connections in fostering of a Healthy Mind-Set.  Healthy Sleep for a Healthy Heart Clinical staff led group instruction and group discussion with PowerPoint presentation and patient guidebook. To enhance the learning environment the use of posters, models and videos may be added. At the conclusion of this workshop, patients will be able to demonstrate knowledge of the importance of sleep to overall health, well-being, and quality of life. They will understand the symptoms of, and treatments for, common sleep disorders. Patients will also be able to identify daytime and nighttime behaviors which impact sleep, and they will be able to apply these tools to help manage sleep-related challenges. The purpose of this lesson is to provide patients with a general overview of sleep and outline the importance of quality sleep. Patients will learn about a few of the most common sleep disorders. Patients will also be introduced to the concept of "sleep hygiene," and discover ways to self-manage certain sleeping problems through simple daily behavior changes. Finally,  the workshop will motivate patients by clarifying the links between quality sleep and their goals of heart-healthy living.   Recognizing and Reducing Stress Clinical staff led group instruction and group discussion with PowerPoint presentation and patient guidebook. To enhance the learning environment the use of posters, models and videos may be added. At the conclusion of this workshop, patients will be able to understand the types of stress reactions, differentiate between acute and chronic stress, and recognize the impact that chronic stress has on their health. They will also be able to apply different coping mechanisms, such as reframing negative self-talk. Patients will have the opportunity to practice a variety of stress management techniques, such as deep abdominal breathing, progressive muscle relaxation, and/or guided imagery.  The purpose of this lesson is to educate patients on the role of stress in their lives and to provide healthy techniques for coping with it.  Learning Barriers/Preferences:  Learning Barriers/Preferences - 05/13/22 1223       Learning Barriers/Preferences   Learning Barriers None    Learning Preferences Audio;Group Instruction;Individual Instruction;Written Material;Verbal Instruction;Skilled Demonstration;Pictoral             Education Topics:  Knowledge Questionnaire Score:  Knowledge Questionnaire Score - 05/13/22 1201       Knowledge Questionnaire Score   Pre Score 20/24             Core Components/Risk Factors/Patient Goals at Admission:  Personal Goals and Risk Factors at Admission - 05/13/22 1141       Core Components/Risk Factors/Patient Goals on Admission    Weight Management Weight Loss;Yes    Intervention Weight Management: Develop a combined nutrition and exercise program  designed to reach desired caloric intake, while maintaining appropriate intake of nutrient and fiber, sodium and fats, and appropriate energy expenditure required  for the weight goal.;Weight Management: Provide education and appropriate resources to help participant work on and attain dietary goals.    Admit Weight 119 lb 4.3 oz (54.1 kg)    Goal Weight: Short Term 111 lb (50.3 kg)    Goal Weight: Long Term 103 lb (46.7 kg)    Expected Outcomes Short Term: Continue to assess and modify interventions until short term weight is achieved;Long Term: Adherence to nutrition and physical activity/exercise program aimed toward attainment of established weight goal;Weight Loss: Understanding of general recommendations for a balanced deficit meal plan, which promotes 1-2 lb weight loss per week and includes a negative energy balance of [phone] kcal/d    Lipids Yes    Intervention Provide education and support for participant on nutrition & aerobic/resistive exercise along with prescribed medications to achieve LDL '70mg'$ , HDL >'40mg'$ .    Expected Outcomes Short Term: Participant states understanding of desired cholesterol values and is compliant with medications prescribed. Participant is following exercise prescription and nutrition guidelines.;Long Term: Cholesterol controlled with medications as prescribed, with individualized exercise RX and with personalized nutrition plan. Value goals: LDL < '70mg'$ , HDL > 40 mg.    Personal Goal Other Yes    Personal Goal Patient would like to start yoga.    Intervention Will show patient Pritikin Introduction to Yoga video.    Expected Outcomes Patient will be able to participate in Yoga.             Core Components/Risk Factors/Patient Goals Review:    Core Components/Risk Factors/Patient Goals at Discharge (Final Review):    ITP Comments:  ITP Comments     Row Name 05/13/22 0942 05/27/22 1436 06/20/22 0832       ITP Comments Medical Director- Dr. [name], MD, Introduction to Pritikin Education Program/ Intensive Cardiac Rehab. Initial Orientation Packet Reviewed with the patient 30 Day ITP comment. [name]  exercise is currently on hold until after her echo and she follows up with Dr Percival Spanish in [place] 30 Day ITP comment. [name] exercise is currently on hold until after her echo and she follows up with Dr Percival Spanish on 06/26/22              Comments: See ITP comments.Harrell Gave RN BSN

## 2022-06-20 NOTE — Telephone Encounter (Signed)
   Name: Elaine Le  DOB: 11/29/1960  MRN: [id number]  Primary Cardiologist: Minus Breeding, MD  Chart reviewed as part of pre-operative protocol coverage. The patient has an upcoming visit scheduled with Dr. Percival Spanish on 06/26/2022 at which time clearance can be addressed in case there are any issues that would impact surgical recommendations.  C4-5, C5-6, C6-7 ANTERIOR CERVICAL FUSION is not scheduled until TBD as below. I added preop FYI to appointment note so that provider is aware to address at time of outpatient visit.  Per office protocol the cardiology provider should forward their finalized clearance decision and recommendations regarding antiplatelet therapy to the requesting party below.    Per office protocol, patient can hold Eliquis for 3 days prior to procedure.    I will route this message as FYI to requesting party and remove this message from the preop box as separate preop APP input not needed at this time.   Please call with any questions.  Lenna Sciara, NP  06/20/2022, 10:37 AM

## 2022-06-23 ENCOUNTER — Ambulatory Visit (HOSPITAL_COMMUNITY): Payer: Medicare Other

## 2022-06-24 NOTE — Progress Notes (Unsigned)
Cardiology Office Note   Date:  06/26/2022   ID:  Elaine Le, DOB 1961-03-19, MRN [id number]  PCP:  Leeroy Cha, MD  Cardiologist:   Minus Breeding, MD   Chief Complaint  Patient presents with   Valvular Heart Disease      History of Present Illness: Elaine Le is a 62 y.o. female who presents for follow-up after mitral valve replacement. She had probable rheumatic disease with severe regurgitation and moderate stenosis and is status post minimally invasive valve replacement.   TEE in Feb 2023 suggested moderate MR with moderate stenosis.  She had an TTE last week.   The mitral valve has severe regurgitation with a 25 mm Edwards Magna bioprosthetic valve.  There was severe MR.  The EF is 65%.   She is now status redo mitral valve replacement.  She had CHB and required pacemaker placement.    She had post op atrial fib.  She had adhesions from her previous surgery.  She had difficult anatomy for adequate exposure.  She did have a 25 mm pericardial Maitra valve placed.  Her left atrial appendage was oversewn.  TEE after coming off bypass demonstrated central regurgitation of the leaflets.  Patient was placed back on bypass.  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  She subsequently had a Mosaic valve placed.  This was stable on follow up echo after the last visit.   Since I last saw her she feels much better.  She is breathing better.  She doing some exercises.  She is not having any new shortness of breath, PND or orthopnea.  She is not having any palpitations, presyncope or syncope.  She has pain and tingling in her right arm and is going to have cervical neck surgery.   Past Medical History:  Diagnosis Date   Acute respiratory failure with hypercapnia ([place]) [id number]   Acute systolic heart failure ([place]) 09/2014   Adenomatous colon polyp    Arthritis    Back pain    Chronic diastolic heart failure (HCC)    related to  MR/MS   Chronic pain syndrome    Chronic sinusitis    COPD (chronic obstructive pulmonary disease)-PFT pending  07/17/2014   Diverticulosis    Dyspnea    Fibromyalgia    GERD (gastroesophageal reflux disease)    Headaches, cluster    Heart murmur    Hiatal hernia    Hx of adenomatous colonic polyps 03/10/2011   Oct 2012, repeat colon Oct 2017    Hypothyroidism    Internal hemorrhoids    Irritable bowel syndrome (IBS)    Mitral valve regurgitation    severe   Mitral valve stenosis, moderate 07/11/2014   Pneumonia    Feb. 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/10/2014   Severe mitral regurgitation with moderate mitral stenosis   S/P minimally invasive mitral valve replacement with bioprosthetic valve 11/08/2014   25 mm [facility] Mitral bovine bioprosthetic tissue valve placed via right mini thoracotomy approach   Sleep apnea    uses CPAP-setting 7   Tobacco abuse     Past Surgical History:  Procedure Laterality Date   CARDIAC CATHETERIZATION     CYSTOSTOMY W/ BLADDER DILATION     at age 5   LEFT AND RIGHT HEART CATHETERIZATION WITH CORONARY ANGIOGRAM N/A 07/27/2014   Procedure: LEFT AND RIGHT HEART CATHETERIZATION WITH CORONARY ANGIOGRAM;  Surgeon: Burnell Blanks, MD;  Location: [facility] CATH LAB;  Right left heart catheterization: Mild/minimal coronary artery disease. RV: 50/7/17, PA: 48/29 (mean 38), PCWP 33 mmHg   MITRAL VALVE REPAIR Right 11/08/2014   Procedure: MINIMALLY INVASIVE MITRAL VALVE REPLACEMENT(MVR);  Surgeon: Rexene Alberts, MD;  Location: [place];  Service: Open Heart Surgery;  Laterality: Right;   MITRAL VALVE REPLACEMENT N/A 02/11/2022   Procedure: REDO MITRAL VALVE REPLACEMENT (MVR) VIA STERNOTOMY USING 27 MM MOSAIC PORCINE MITRAL VALVE;  Surgeon: [name], MD;  Location: [place];  Service: Open Heart Surgery;  Laterality: N/A;   NEUROPLASTY / TRANSPOSITION MEDIAN NERVE AT CARPAL  TUNNEL BILATERAL     PACEMAKER IMPLANT N/A 02/17/2022   Procedure: PACEMAKER IMPLANT;  Surgeon: Vickie Epley, MD;  Location: [place] CV LAB;  Service: Cardiovascular;  Laterality: N/A;   RIGHT/LEFT HEART CATH AND CORONARY ANGIOGRAPHY N/A 12/26/2021   Procedure: RIGHT/LEFT HEART CATH AND CORONARY ANGIOGRAPHY;  Surgeon: Sherren Mocha, MD;  Location: [place] CV LAB;  Service: Cardiovascular;  Laterality: N/A;   TEE WITHOUT CARDIOVERSION N/A 07/10/2014   Procedure: TRANSESOPHAGEAL ECHOCARDIOGRAM (TEE);  Surgeon: Lelon Perla, MD;  Location: [facility] ENDOSCOPY;  Service: Cardiovascular;  55-60%. No regional wall motion modalities. Moderate mitral stenosis with severe regurgitation and moderate to severely dilated left atrium.   TEE WITHOUT CARDIOVERSION N/A 11/08/2014   Procedure: TRANSESOPHAGEAL ECHOCARDIOGRAM (TEE);  Surgeon: Rexene Alberts, MD;  Location: [place];  Service: Open Heart Surgery;  Laterality: N/A;   TEE WITHOUT CARDIOVERSION N/A 06/07/2021   Procedure: TRANSESOPHAGEAL ECHOCARDIOGRAM (TEE);  Surgeon: Sueanne Margarita, MD;  Location[facility] END[facility]COPY;  Service: Cardiovascular;  Laterality: N/A;   TEE WITHOUT CARDIOVERSION N/A 02/11/2022   Procedure: TRANSESOPHAGEAL ECHOCARDIOGRAM (TEE);  Surgeon: [name], MD;  Location: [place];  Service: Open Heart Surgery;  Laterality: N/A;   THORACIC OUTLET SURGERY     TRANSTHORACIC ECHOCARDIOGRAM  07/04/2014   Normal LV Size/Fnx - EF 60-65% no RWMA; MV: thickened leaflets with doming, fixed Posterior leaflet, anterior leaflet w/ large/shaggy & mobile density.  c/w Rheumatic Mitral disease - moderate MS & Mod-Severe MR.   TUBAL LIGATION     VULVA SURGERY       Current Outpatient Medications  Medication Sig Dispense Refill   clobetasol (TEMOVATE) 0.05 % external solution Apply 1 application  topically daily as needed (scalp irritation.).     estradiol (ESTRACE) 0.1 MG/GM vaginal cream 1 gram vaginally twice weekly 42.5 g 6    furosemide (LASIX) 20 MG tablet Take 1 tablet (20 mg total) by mouth daily. For weight gain of 3 lbs in 24 or 5 lbs in 48 hours 90 tablet 3   hydrocortisone cream 1 % Apply 1 Application topically as needed for itching.     ketoconazole (NIZORAL) 2 % shampoo Apply 1 Application topically daily as needed (scalp irritation.).     liothyronine (CYTOMEL) 5 MCG tablet Take 5 mcg by mouth daily.   2   metroNIDAZOLE (METROCREAM) 0.75 % cream Apply 1 application  topically in the morning. Face     SYNTHROID 75 MCG tablet Take 75 mcg by mouth daily before breakfast.      No current facility-administered medications for this visit.    Allergies:   Estrace [estradiol], Reglan [metoclopramide], Warfarin and related, Other, Morphine and related, Singulair [montelukast sodium], and Statins    ROS:  Please see the history of present illness.   Otherwise, review of systems are positive for none. All other  systems are reviewed and negative.    PHYSICAL EXAM: VS:  BP 136/78   Pulse 90   Ht 5' (1.524 m)   Wt 126 lb (57.2 kg)   SpO2 100%   BMI 24.61 kg/m  , BMI Body mass index is 24.61 kg/m.  GENERAL:  Well appearing NECK:  No jugular venous distention, waveform within normal limits, carotid upstroke brisk and symmetric, no bruits, no thyromegaly LUNGS:  Clear to auscultation bilaterally CHEST:  Unremarkable HEART:  PMI not displaced or sustained,S1 and S2 within normal limits, no S3, no S4, no clicks, no rubs,  3 out of 6 systolic murmur radiating slightly at the aortic outflow tract and anterior precordium, positive diastolic murmur heard best at the third left intercostal space murmurs  ABD:  Flat, positive bowel sounds normal in frequency in pitch, no bruits, no rebound, no guarding, no midline pulsatile mass, no hepatomegaly, no splenomegaly EXT:  2 plus pulses throughout, no edema, no cyanosis no clubbing   EKG:  EKG is not ordered today. A  Recent Labs: 11/09/2014: Magnesium 2.1 01/10/2015:  ALT 27; BUN 9; Creatinine, Ser 0.70; Potassium 4.3; Sodium 139 02/01/2015: Hemoglobin 15.1*; Platelets 292.0 03/29/2015: TSH 4.03    Lipid Panel    Component Value Date/Time   CHOL 256 (H) 06/26/2020 1124   TRIG 50.0 06/26/2020 1124   HDL 73.50 06/26/2020 1124   CHOLHDL 3 06/26/2020 1124   VLDL 10.0 06/26/2020 1124   LDLCALC 172 (H) 06/26/2020 1124      Wt Readings from Last 3 Encounters:  06/26/22 126 lb (57.2 kg)  06/09/22 122 lb (55.3 kg)  05/23/22 119 lb (54 kg)      Other studies Reviewed: Additional studies/ records that were reviewed today include: None  ASSESSMENT AND PLAN:  MV REPLACEMENT:    This was stable on echo earlier this month.   No change in therapy.   DYSLIPIDEMIA:    LDL was 202.   She has been intolerant of statins.  She does not want further medication management.    PPM:   I looked at her most recent interrogation and it was fine in January without any evidence of atrial fibrillation.  See below.   ATRIAL FIB: She has had no further fib and is followed with device interrogation.  She wants to be off the Eliquis and think this is reasonable.  SLEEP APNEA:    She continues to use CPAP.   PREOP: The patient will be at acceptable risk for the planned procedure.  No change in therapy.   Current medicines are reviewed at length with the patient today.  The patient does not have concerns regarding medicines.  The following changes have been made: None  Labs/ tests ordered today include:   None   Disposition:   FU with me or APP in 6 months.  Ronnell Guadalajara, MD  06/26/2022 11:30 AM    [place]

## 2022-06-25 ENCOUNTER — Ambulatory Visit (HOSPITAL_COMMUNITY): Payer: Medicare Other

## 2022-06-26 ENCOUNTER — Encounter: Payer: Self-pay | Admitting: Cardiology

## 2022-06-26 ENCOUNTER — Ambulatory Visit: Payer: Medicare Other | Attending: Cardiology | Admitting: Cardiology

## 2022-06-26 VITALS — BP 136/78 | HR 90 | Ht 60.0 in | Wt 126.0 lb

## 2022-06-26 DIAGNOSIS — I480 Paroxysmal atrial fibrillation: Secondary | ICD-10-CM | POA: Diagnosis present

## 2022-06-26 DIAGNOSIS — E785 Hyperlipidemia, unspecified: Secondary | ICD-10-CM

## 2022-06-26 DIAGNOSIS — Z952 Presence of prosthetic heart valve: Secondary | ICD-10-CM | POA: Diagnosis present

## 2022-06-26 NOTE — Patient Instructions (Signed)
Medication Instructions:  STOP Eliquis  *If you need a refill on your cardiac medications before your next appointment, please call your pharmacy*  Follow-Up: At [facility], you and your health needs are our priority.  As part of our continuing mission to provide you with exceptional heart care, we have created designated Provider Care Teams.  These Care Teams include your primary Cardiologist (physician) and Advanced Practice Providers (APPs -  Physician Assistants and Nurse Practitioners) who all work together to provide you with the care you need, when you need it.  We recommend signing up for the patient portal called "MyChart".  Sign up information is provided on this After Visit Summary.  MyChart is used to connect with patients for Virtual Visits (Telemedicine).  Patients are able to view lab/test results, encounter notes, upcoming appointments, etc.  Non-urgent messages can be sent to your provider as well.   To learn more about what you can do with MyChart, go to [email or web address].    Your next appointment:   6 month(s) with NP or PA

## 2022-06-27 ENCOUNTER — Ambulatory Visit (HOSPITAL_COMMUNITY): Payer: Medicare Other

## 2022-06-30 ENCOUNTER — Telehealth (HOSPITAL_COMMUNITY): Payer: Self-pay | Admitting: *Deleted

## 2022-06-30 ENCOUNTER — Ambulatory Visit (HOSPITAL_COMMUNITY): Payer: Medicare Other

## 2022-06-30 NOTE — Telephone Encounter (Signed)
Spoke with Elaine Le she is having surgery in March and will not be participating in cardiac rehab at this time. [name] only attended orientation and did not begin exercise.Harrell Gave RN BSN

## 2022-07-02 ENCOUNTER — Ambulatory Visit (HOSPITAL_COMMUNITY): Payer: Medicare Other

## 2022-07-04 ENCOUNTER — Ambulatory Visit (HOSPITAL_COMMUNITY): Payer: Medicare Other

## 2022-07-07 ENCOUNTER — Ambulatory Visit (HOSPITAL_COMMUNITY): Payer: Medicare Other

## 2022-07-07 NOTE — Progress Notes (Signed)
Remote pacemaker transmission.   

## 2022-07-09 ENCOUNTER — Ambulatory Visit (HOSPITAL_COMMUNITY): Payer: Medicare Other

## 2022-07-11 ENCOUNTER — Ambulatory Visit (HOSPITAL_COMMUNITY): Payer: Medicare Other

## 2022-07-17 ENCOUNTER — Ambulatory Visit: Payer: Medicaid Other | Admitting: Allergy

## 2022-07-18 ENCOUNTER — Other Ambulatory Visit: Payer: Medicare Other

## 2022-08-18 ENCOUNTER — Ambulatory Visit (INDEPENDENT_AMBULATORY_CARE_PROVIDER_SITE_OTHER): Payer: Medicare Other

## 2022-08-18 DIAGNOSIS — I442 Atrioventricular block, complete: Secondary | ICD-10-CM

## 2022-08-19 LAB — CUP PACEART REMOTE DEVICE CHECK
Battery Remaining Percentage: 95 %
Brady Statistic RA Percent Paced: 0 %
Brady Statistic RV Percent Paced: 100 %
Date Time Interrogation Session: 20240422130929
Implantable Lead Connection Status: 753985
Implantable Lead Connection Status: 753985
Implantable Lead Implant Date: 20231023
Implantable Lead Implant Date: 20231023
Implantable Lead Location: 753859
Implantable Lead Location: 753860
Implantable Lead Model: 377
Implantable Lead Model: 377
Implantable Lead Serial Number: 8001105850
Implantable Lead Serial Number: 8001122371
Implantable Pulse Generator Implant Date: 20231023
Lead Channel Impedance Value: 468 Ohm
Lead Channel Impedance Value: 527 Ohm
Lead Channel Pacing Threshold Amplitude: 0.9 V
Lead Channel Pacing Threshold Amplitude: 1.2 V
Lead Channel Pacing Threshold Pulse Width: 0.4 ms
Lead Channel Pacing Threshold Pulse Width: 0.4 ms
Lead Channel Sensing Intrinsic Amplitude: 4.2 mV
Lead Channel Sensing Intrinsic Amplitude: 5.7 mV
Lead Channel Setting Pacing Amplitude: 2.4 V
Lead Channel Setting Pacing Amplitude: 2.4 V
Lead Channel Setting Pacing Pulse Width: 0.4 ms
Pulse Gen Model: 407145
Pulse Gen Serial Number: 1000084874

## 2022-09-09 ENCOUNTER — Encounter (HOSPITAL_BASED_OUTPATIENT_CLINIC_OR_DEPARTMENT_OTHER): Payer: Self-pay | Admitting: Obstetrics & Gynecology

## 2022-09-09 ENCOUNTER — Ambulatory Visit (INDEPENDENT_AMBULATORY_CARE_PROVIDER_SITE_OTHER): Payer: Medicare Other | Admitting: Obstetrics & Gynecology

## 2022-09-09 ENCOUNTER — Other Ambulatory Visit (HOSPITAL_COMMUNITY)
Admission: RE | Admit: 2022-09-09 | Discharge: 2022-09-09 | Disposition: A | Discharge status: Home / Self Care | Payer: Medicare Other | Source: Ambulatory Visit | Attending: Obstetrics & Gynecology | Admitting: Obstetrics & Gynecology

## 2022-09-09 VITALS — BP 147/63 | HR 87 | Ht 60.0 in | Wt 121.8 lb

## 2022-09-09 DIAGNOSIS — N949 Unspecified condition associated with female genital organs and menstrual cycle: Secondary | ICD-10-CM

## 2022-09-09 DIAGNOSIS — R8290 Unspecified abnormal findings in urine: Secondary | ICD-10-CM | POA: Insufficient documentation

## 2022-09-09 DIAGNOSIS — N898 Other specified noninflammatory disorders of vagina: Secondary | ICD-10-CM | POA: Diagnosis not present

## 2022-09-09 LAB — POCT URINALYSIS DIPSTICK
Bilirubin, UA: NEGATIVE
Blood, UA: NEGATIVE
Glucose, UA: NEGATIVE
Ketones, UA: NEGATIVE
Leukocytes, UA: NEGATIVE
Nitrite, UA: NEGATIVE
Protein, UA: NEGATIVE
Spec Grav, UA: <=1.005 — AB (ref 1.010–1.025)
Urobilinogen, UA: 0.2 E.U./dL
pH, UA: 6 (ref 5.0–8.0)

## 2022-09-09 MED ORDER — METRONIDAZOLE 500 MG PO TABS: 500.0000 mg | ORAL_TABLET | Freq: Two times a day (BID) | ORAL | 0 refills | Status: DC

## 2022-09-09 MED ORDER — TERCONAZOLE 0.4 % VA CREA: 1.0000 | TOPICAL_CREAM | Freq: Every day | VAGINAL | 0 refills | Status: AC

## 2022-09-09 MED ORDER — SULFAMETHOXAZOLE-TRIMETHOPRIM 800-160 MG PO TABS: 1.0000 | ORAL_TABLET | Freq: Two times a day (BID) | ORAL | 0 refills | Status: DC

## 2022-09-09 NOTE — Patient Instructions (Signed)
[facility]  [phone] [address] [place], [place] [address]

## 2022-09-09 NOTE — Progress Notes (Signed)
GYNECOLOGY  VISIT  CC:   dark urine/odor  HPI: 62 y.o. G61P1001 Single White or Caucasian female here for complaint of darker urine in the morning with urine odor that has been going on about a month.  Denies fever.  Is having some lower back pain.  Hasn't seen blood in her urine.     Past Medical History:  Diagnosis Date   Acute respiratory failure with hypercapnia (HCC) 06/21/2014   Acute systolic heart failure (HCC) 09/2014   Adenomatous colon polyp    Arthritis    Back pain    Chronic diastolic heart failure (HCC)    related to MR/MS   Chronic pain syndrome    Chronic sinusitis    COPD (chronic obstructive pulmonary disease)-PFT pending  07/17/2014   Diverticulosis    Dyspnea    Fibromyalgia    GERD (gastroesophageal reflux disease)    Headaches, cluster    Heart murmur    Hiatal hernia    Hx of adenomatous colonic polyps 03/10/2011   Oct 2012, repeat colon Oct 2017    Hypothyroidism    Internal hemorrhoids    Irritable bowel syndrome (IBS)    Mitral valve regurgitation    severe   Mitral valve stenosis, moderate 07/11/2014   Pneumonia    Feb. 24, 2016   Protein-calorie malnutrition, severe (HCC) 07/06/2014   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/10/2014   Severe mitral regurgitation with moderate mitral stenosis   S/P minimally invasive mitral valve replacement with bioprosthetic valve 11/08/2014   25 mm [facility] Mitral bovine bioprosthetic tissue valve placed via right mini thoracotomy approach   Sleep apnea    uses CPAP-setting 7   Tobacco abuse     MEDS:   Current Outpatient Medications on File Prior to Visit  Medication Sig Dispense Refill   clobetasol (TEMOVATE) 0.05 % external solution Apply 1 application  topically daily as needed (scalp irritation.).     estradiol (ESTRACE) 0.1 MG/GM vaginal cream 1 gram vaginally twice weekly 42.5 g 6   hydrocortisone cream 1 % Apply 1 Application topically as needed for itching.     ketoconazole (NIZORAL) 2 %  shampoo Apply 1 Application topically daily as needed (scalp irritation.).     liothyronine (CYTOMEL) 5 MCG tablet Take 5 mcg by mouth daily.   2   metroNIDAZOLE (METROCREAM) 0.75 % cream Apply 1 application  topically in the morning. Face     SYNTHROID 75 MCG tablet Take 75 mcg by mouth daily before breakfast.      No current facility-administered medications on file prior to visit.    ALLERGIES: Estrace [estradiol], Reglan [metoclopramide], Warfarin and related, Other, Morphine and related, Singulair [montelukast sodium], and Statins  SH:  single, non smoker  Review of Systems  Constitutional: Negative.   Genitourinary:        Vaginal and urine odor    PHYSICAL EXAMINATION:    BP (!) 147/63 (BP Location: Right Arm, Patient Position: Sitting, Cuff Size: Normal)   Pulse 87   Ht 5' (1.524 m) Comment: Reported  Wt 121 lb 12.8 oz (55.2 kg)   BMI 23.79 kg/m     General appearance: alert, cooperative and appears stated age Flank: No CVA tenderness Does not want pelvic exam  Pt obtained self swab and gave urine sample  Assessment/Plan: .1. Vaginal odor - pt desires treatment today - metroNIDAZOLE (FLAGYL) 500 MG tablet; Take 1 tablet (500 mg total) by mouth 2 (two) times daily.  Dispense: 14 tablet; Refill:  0  2. Vaginal discomfort - Cervicovaginal ancillary only( [place]) - terconazole (TERAZOL 7) 0.4 % vaginal cream; Place 1 applicator vaginally at bedtime.  Dispense: 45 g; Refill: 0  3. Abnormal urine odor - Urine culture - sulfamethoxazole-trimethoprim (BACTRIM DS) 800-160 MG tablet; Take 1 tablet by mouth 2 (two) times daily.  Dispense: 6 tablet; Refill: 0

## 2022-09-10 LAB — CERVICOVAGINAL ANCILLARY ONLY
Bacterial Vaginitis (gardnerella): NEGATIVE
Candida Glabrata: NEGATIVE
Candida Vaginitis: NEGATIVE
Comment: NEGATIVE
Comment: NEGATIVE
Comment: NEGATIVE

## 2022-09-12 LAB — URINE CULTURE: Organism ID, Bacteria: NO GROWTH

## 2022-09-24 NOTE — Progress Notes (Signed)
Remote pacemaker transmission.   

## 2022-11-17 ENCOUNTER — Ambulatory Visit (INDEPENDENT_AMBULATORY_CARE_PROVIDER_SITE_OTHER): Payer: Medicare Other

## 2022-11-17 DIAGNOSIS — I442 Atrioventricular block, complete: Secondary | ICD-10-CM

## 2022-11-19 LAB — CUP PACEART REMOTE DEVICE CHECK
Battery Voltage: 95
Date Time Interrogation Session: 20240722094342
Implantable Lead Connection Status: 753985
Implantable Lead Connection Status: 753985
Implantable Lead Implant Date: 20231023
Implantable Lead Implant Date: 20231023
Implantable Lead Location: 753859
Implantable Lead Location: 753860
Implantable Lead Model: 377
Implantable Lead Model: 377
Implantable Lead Serial Number: 8001105850
Implantable Lead Serial Number: 8001122371
Implantable Pulse Generator Implant Date: 20231023
Pulse Gen Model: 407145
Pulse Gen Serial Number: 1000084874

## 2022-11-28 ENCOUNTER — Encounter: Payer: Self-pay | Admitting: Cardiology

## 2022-11-28 ENCOUNTER — Ambulatory Visit: Payer: Medicare Other | Attending: Cardiology | Admitting: Cardiology

## 2022-11-28 VITALS — BP 106/64 | HR 91 | Ht 60.0 in | Wt 121.6 lb

## 2022-11-28 DIAGNOSIS — I480 Paroxysmal atrial fibrillation: Secondary | ICD-10-CM | POA: Insufficient documentation

## 2022-11-28 DIAGNOSIS — Z950 Presence of cardiac pacemaker: Secondary | ICD-10-CM | POA: Diagnosis not present

## 2022-11-28 DIAGNOSIS — Z952 Presence of prosthetic heart valve: Secondary | ICD-10-CM | POA: Insufficient documentation

## 2022-11-28 NOTE — Progress Notes (Signed)
Cardiology Office Note:   Date:  11/28/2022  ID:  Elaine Le, DOB Jun 04, 1960, MRN [id number] PCP: Lorenda Ishihara, MD  [facility] Providers Cardiologist:  Rollene Rotunda, MD Electrophysiologist:  Lanier Prude, MD {  History of Present Illness:   Elaine Le is a 62 y.o. female who presents for follow-up after mitral valve replacement. She had probable rheumatic disease with severe regurgitation and moderate stenosis and is status post minimally invasive valve replacement.   TEE in Feb 2023 suggested moderate MR with moderate stenosis.  On TEE mitral valve has severe regurgitation with a 25 mm Edwards Magna bioprosthetic valve.  There was severe MR.  The EF is 65%.   She is now status redo mitral valve replacement.  She had CHB and required pacemaker placement.    She had post op atrial fib.  She had adhesions from her previous surgery.  She had difficult anatomy for adequate exposure.  She did have a 25 mm pericardial Maitra valve placed.  Her left atrial appendage was oversewn.  TEE after coming off bypass demonstrated central regurgitation of the leaflets.  Patient was placed back on bypass.  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  She subsequently had a Mosaic valve placed.  This was stable on follow up echo in Feb 2024.    Since I last saw her she has done well.  She seems to be recovering from her surgeries.  She was painting the kitchen and did well with this up and down the ladders.  He has a new puppy and is going to start walking her soon.  The patient denies any new symptoms such as chest discomfort, neck or arm discomfort. There has been no new shortness of breath, PND or orthopnea. There have been no reported palpitations, presyncope or syncope.    ROS: As stated in the HPI and negative for all other systems.  Studies Reviewed:    EKG:   NA  Risk Assessment/Calculations:              Physical Exam:   VS:  BP  106/64   Pulse 91   Ht 5' (1.524 m)   Wt 121 lb 9.6 oz (55.2 kg)   SpO2 99%   BMI 23.75 kg/m    Wt Readings from Last 3 Encounters:  11/28/22 121 lb 9.6 oz (55.2 kg)  09/09/22 121 lb 12.8 oz (55.2 kg)  06/26/22 126 lb (57.2 kg)     GEN: Well nourished, well developed in no acute distress NECK: No JVD; No carotid bruits CARDIAC: RRR, no murmurs, rubs, gallops RESPIRATORY:  Clear to auscultation without rales, wheezing or rhonchi  ABDOMEN: Soft, non-tender, non-distended EXTREMITIES:  No edema; No deformity   ASSESSMENT AND PLAN:   MV REPLACEMENT:    This was stable on follow-up echo   this was stable on echo earlier this month.   No change in therapy.  There is mild mitral stenosis.  AI: This was moderate on echo and I will follow this up with repeat echo in 1 year.   DYSLIPIDEMIA:    LDL was 202 but she has not wanted any statin or change in therapy.    PPM:   I looked at her interrogation from July of this year.  She has normal pacemaker function and no fibrillation.  ATRIAL FIB:   We took her off Eliquis previously at her request.  She has had no recurrent fibrillation.  We can  monitor this on her device.  SLEEP APNEA:    She continues to wear CPAP.  No change in therapy.        Follow up me in six months.   Signed, Rollene Rotunda, MD

## 2022-11-28 NOTE — Patient Instructions (Signed)
  Follow-Up: At [facility], you and your health needs are our priority.  As part of our continuing mission to provide you with exceptional heart care, we have created designated Provider Care Teams.  These Care Teams include your primary Cardiologist (physician) and Advanced Practice Providers (APPs -  Physician Assistants and Nurse Practitioners) who all work together to provide you with the care you need, when you need it.  We recommend signing up for the patient portal called "MyChart".  Sign up information is provided on this After Visit Summary.  MyChart is used to connect with patients for Virtual Visits (Telemedicine).  Patients are able to view lab/test results, encounter notes, upcoming appointments, etc.  Non-urgent messages can be sent to your provider as well.   To learn more about what you can do with MyChart, go to [email or web address].    Your next appointment:   6 month(s)  Provider:   James Hochrein, MD      

## 2022-12-05 NOTE — Progress Notes (Signed)
Remote pacemaker transmission.   

## 2022-12-08 ENCOUNTER — Other Ambulatory Visit (HOSPITAL_COMMUNITY): Payer: Self-pay | Admitting: Obstetrics & Gynecology

## 2022-12-08 DIAGNOSIS — Z1231 Encounter for screening mammogram for malignant neoplasm of breast: Secondary | ICD-10-CM

## 2022-12-25 ENCOUNTER — Ambulatory Visit: Payer: Medicare Other | Admitting: Cardiology

## 2023-02-16 ENCOUNTER — Ambulatory Visit: Payer: Medicare Other

## 2023-02-16 DIAGNOSIS — I442 Atrioventricular block, complete: Secondary | ICD-10-CM | POA: Diagnosis not present

## 2023-02-16 DIAGNOSIS — I480 Paroxysmal atrial fibrillation: Secondary | ICD-10-CM

## 2023-02-16 LAB — CUP PACEART REMOTE DEVICE CHECK
Battery Remaining Percentage: 90 %
Brady Statistic RA Percent Paced: 0 %
Brady Statistic RV Percent Paced: 100 %
Date Time Interrogation Session: 20241021090426
Implantable Lead Connection Status: 753985
Implantable Lead Connection Status: 753985
Implantable Lead Implant Date: 20231023
Implantable Lead Implant Date: 20231023
Implantable Lead Location: 753859
Implantable Lead Location: 753860
Implantable Lead Model: 377
Implantable Lead Model: 377
Implantable Lead Serial Number: 8001105850
Implantable Lead Serial Number: 8001122371
Implantable Pulse Generator Implant Date: 20231023
Lead Channel Impedance Value: 468 Ohm
Lead Channel Impedance Value: 527 Ohm
Lead Channel Pacing Threshold Amplitude: 0.8 V
Lead Channel Pacing Threshold Amplitude: 1 V
Lead Channel Pacing Threshold Pulse Width: 0.4 ms
Lead Channel Pacing Threshold Pulse Width: 0.4 ms
Lead Channel Sensing Intrinsic Amplitude: 3.3 mV
Lead Channel Sensing Intrinsic Amplitude: 6.8 mV
Lead Channel Setting Pacing Amplitude: 2.4 V
Lead Channel Setting Pacing Amplitude: 2.4 V
Lead Channel Setting Pacing Pulse Width: 0.4 ms
Pulse Gen Model: 407145
Pulse Gen Serial Number: 1000084874

## 2023-02-20 ENCOUNTER — Ambulatory Visit: Payer: Medicare Other | Admitting: Cardiology

## 2023-02-27 ENCOUNTER — Other Ambulatory Visit: Payer: Self-pay | Admitting: Internal Medicine

## 2023-02-27 ENCOUNTER — Inpatient Hospital Stay: Admission: RE | Admit: 2023-02-27 | Payer: Medicare Other | Source: Ambulatory Visit

## 2023-02-27 DIAGNOSIS — Z13820 Encounter for screening for osteoporosis: Secondary | ICD-10-CM

## 2023-03-05 NOTE — Progress Notes (Signed)
Remote pacemaker transmission.   

## 2023-05-18 ENCOUNTER — Ambulatory Visit (INDEPENDENT_AMBULATORY_CARE_PROVIDER_SITE_OTHER): Payer: Medicare Other

## 2023-05-18 DIAGNOSIS — I442 Atrioventricular block, complete: Secondary | ICD-10-CM | POA: Diagnosis not present

## 2023-05-18 DIAGNOSIS — I480 Paroxysmal atrial fibrillation: Secondary | ICD-10-CM

## 2023-05-18 LAB — CUP PACEART REMOTE DEVICE CHECK
Date Time Interrogation Session: 20250120081122
Implantable Lead Connection Status: 753985
Implantable Lead Connection Status: 753985
Implantable Lead Implant Date: 20231023
Implantable Lead Implant Date: 20231023
Implantable Lead Location: 753859
Implantable Lead Location: 753860
Implantable Lead Model: 377
Implantable Lead Model: 377
Implantable Lead Serial Number: 8001105850
Implantable Lead Serial Number: 8001122371
Implantable Pulse Generator Implant Date: 20231023
Pulse Gen Model: 407145
Pulse Gen Serial Number: 1000084874

## 2023-05-22 NOTE — Progress Notes (Unsigned)
  Electrophysiology Office Note:   ID:  Elaine Le, DOB Dec 03, 1960, MRN [id number]  Primary Cardiologist: Rollene Rotunda, MD Electrophysiologist: Lanier Prude, MD  {Click to update primary MD,subspecialty MD or APP then REFRESH:1}    History of Present Illness:   Elaine Le is a 63 y.o. female with h/o COPD, rheumatic MV s/p replacement and redo 01/2032, CHB s/p PPM seen today for routine electrophysiology followup.   Since last being seen in our clinic the patient reports doing ***.  she denies chest pain, palpitations, dyspnea, PND, orthopnea, nausea, vomiting, dizziness, syncope, edema, weight gain, or early satiety.   Review of systems complete and found to be negative unless listed in HPI.   EP Information / Studies Reviewed:    EKG is ordered today. Personal review as below.       PPM Interrogation-  reviewed in detail today,  See PACEART report.  Arrhythmia/Device History Biotronik dual chamber PPM implanted 02/17/22 RV lead is in LB position    Physical Exam:   VS:  There were no vitals taken for this visit.   Wt Readings from Last 3 Encounters:  11/28/22 121 lb 9.6 oz (55.2 kg)  09/09/22 121 lb 12.8 oz (55.2 kg)  06/26/22 126 lb (57.2 kg)     GEN: No acute distress  NECK: No JVD; No carotid bruits CARDIAC: {EPRHYTHM:28826}, no murmurs, rubs, gallops RESPIRATORY:  Clear to auscultation without rales, wheezing or rhonchi  ABDOMEN: Soft, non-tender, non-distended EXTREMITIES:  {EDEMA LEVEL:28147::"No"} edema; No deformity   ASSESSMENT AND PLAN:    CHB s/p Biotronik PPM  Normal PPM function See Arita Miss Art report No changes today  MVR 2017, redo 2023 Echo 05/2022 LVEF 60-65%, stable valve   H/o AFL  Previously taken off Eliquis with CHA2DS2/VASc of 1 when disregarding Gender.     {Click here to Review PMH, Prob List, Meds, Allergies, SHx, FHx  :1}   Disposition:   Follow up with {EPPROVIDERS:28135} {EPFOLLOW UP:28173}  Signed, Graciella Freer, PA-C

## 2023-05-25 ENCOUNTER — Ambulatory Visit: Payer: Medicare Other | Attending: Physician Assistant | Admitting: Student

## 2023-05-25 ENCOUNTER — Encounter: Payer: Self-pay | Admitting: Student

## 2023-05-25 VITALS — BP 152/60 | HR 74 | Ht 60.0 in | Wt 123.0 lb

## 2023-05-25 DIAGNOSIS — I442 Atrioventricular block, complete: Secondary | ICD-10-CM | POA: Insufficient documentation

## 2023-05-25 DIAGNOSIS — Z952 Presence of prosthetic heart valve: Secondary | ICD-10-CM | POA: Diagnosis not present

## 2023-05-25 DIAGNOSIS — I480 Paroxysmal atrial fibrillation: Secondary | ICD-10-CM | POA: Diagnosis present

## 2023-05-25 DIAGNOSIS — Z950 Presence of cardiac pacemaker: Secondary | ICD-10-CM | POA: Insufficient documentation

## 2023-05-25 LAB — CUP PACEART INCLINIC DEVICE CHECK
Date Time Interrogation Session: 20250127103828
Implantable Lead Connection Status: 753985
Implantable Lead Connection Status: 753985
Implantable Lead Implant Date: 20231023
Implantable Lead Implant Date: 20231023
Implantable Lead Location: 753859
Implantable Lead Location: 753860
Implantable Lead Model: 377
Implantable Lead Model: 377
Implantable Lead Serial Number: 8001105850
Implantable Lead Serial Number: 8001122371
Implantable Pulse Generator Implant Date: 20231023
Pulse Gen Model: 407145
Pulse Gen Serial Number: 1000084874

## 2023-05-25 NOTE — Patient Instructions (Addendum)
Medication Instructions:  Your physician recommends that you continue on your current medications as directed. Please refer to the Current Medication list given to you today.  *If you need a refill on your cardiac medications before your next appointment, please call your pharmacy*  Lab Work: None ordered If you have labs (blood work) drawn today and your tests are completely normal, you will receive your results only by: MyChart Message (if you have MyChart) OR A paper copy in the mail If you have any lab test that is abnormal or we need to change your treatment, we will call you to review the results.  Follow-Up: At [facility], you and your health needs are our priority.  As part of our continuing mission to provide you with exceptional heart care, we have created designated Provider Care Teams.  These Care Teams include your primary Cardiologist (physician) and Advanced Practice Providers (APPs -  Physician Assistants and Nurse Practitioners) who all work together to provide you with the care you need, when you need it.  We recommend signing up for the patient portal called "MyChart".  Sign up information is provided on this After Visit Summary.  MyChart is used to connect with patients for Virtual Visits (Telemedicine).  Patients are able to view lab/test results, encounter notes, upcoming appointments, etc.  Non-urgent messages can be sent to your provider as well.   To learn more about what you can do with MyChart, go to [email or web address].    Your next appointment:   1 year(s)  Provider:   Steffanie Dunn, MD

## 2023-06-25 ENCOUNTER — Encounter (HOSPITAL_BASED_OUTPATIENT_CLINIC_OR_DEPARTMENT_OTHER): Payer: Self-pay | Admitting: Obstetrics & Gynecology

## 2023-06-25 ENCOUNTER — Ambulatory Visit (INDEPENDENT_AMBULATORY_CARE_PROVIDER_SITE_OTHER): Payer: Medicare Other | Admitting: Obstetrics & Gynecology

## 2023-06-25 ENCOUNTER — Telehealth (HOSPITAL_BASED_OUTPATIENT_CLINIC_OR_DEPARTMENT_OTHER): Payer: Self-pay | Admitting: Obstetrics & Gynecology

## 2023-06-25 VITALS — BP 174/88 | HR 83 | Ht 60.0 in | Wt 121.8 lb

## 2023-06-25 DIAGNOSIS — E538 Deficiency of other specified B group vitamins: Secondary | ICD-10-CM

## 2023-06-25 DIAGNOSIS — N898 Other specified noninflammatory disorders of vagina: Secondary | ICD-10-CM | POA: Diagnosis not present

## 2023-06-25 DIAGNOSIS — Z90710 Acquired absence of both cervix and uterus: Secondary | ICD-10-CM

## 2023-06-25 DIAGNOSIS — Z01419 Encounter for gynecological examination (general) (routine) without abnormal findings: Secondary | ICD-10-CM | POA: Diagnosis not present

## 2023-06-25 DIAGNOSIS — Z952 Presence of prosthetic heart valve: Secondary | ICD-10-CM

## 2023-06-25 DIAGNOSIS — E559 Vitamin D deficiency, unspecified: Secondary | ICD-10-CM

## 2023-06-25 DIAGNOSIS — R5383 Other fatigue: Secondary | ICD-10-CM

## 2023-06-25 MED ORDER — NONFORMULARY OR COMPOUNDED ITEM: 3 refills | Status: DC

## 2023-06-25 NOTE — Progress Notes (Signed)
 ANNUAL EXAM Patient name: NORRINE BALLESTER MRN [id number]  Date of birth: 11-27-1960 Chief Complaint:   Annual Exam  History of Present Illness:   NAYLA DIAS is a 63 y.o. G57P1001 Caucasian female being seen today for a routine annual exam. H/o Vit D issues.  Was getting taking Vit D from a specialty pharmacy but this pharmacy is no longer in business.  What she is getting from [employer] doesn't seem to help her energy.   Has some vaginal dryness at times.  Has used estrogen cream but not using.  Has tried Replens.  Doesn't like the greasiness of coconut oil.     No LMP recorded. Patient is postmenopausal.   Last pap 05/2022. Results were: NILM w/ HRHPV negative.  Last mammogram: 05/2022. Results were: normal. Family h/o breast cancer: yes paternal aunt Last colonoscopy: 2012. Cologuard was ordered by PCP.  She had issues with the company.  Declines doing this again.  Declines colonoscopy.   Dexa:  scheduled for July, 2025     06/25/2023    9:38 AM 09/09/2022    2:19 PM 06/09/2022   10:40 AM 05/13/2022   11:20 AM 06/18/2021    3:10 PM  Depression screen PHQ 2/9  Decreased Interest 0 0 0 0 0  Down, Depressed, Hopeless 0 0 0 0 0  PHQ - 2 Score 0 0 0 0 0  Altered sleeping    0   Tired, decreased energy    0   Change in appetite    0   Feeling bad or failure about yourself     0   Trouble concentrating    0   Moving slowly or fidgety/restless    0   Suicidal thoughts    0   PHQ-9 Score    0   Difficult doing work/chores    Not difficult at all     Review of Systems:   Pertinent items are noted in HPI  Denies any headaches, blurred vision, fatigue, shortness of breath, chest pain, abdominal pain, abnormal vaginal discharge/itching/odor/irritation, problems with periods, bowel movements, urination, or intercourse unless otherwise stated above. Pertinent History Reviewed:  Reviewed past medical,surgical, social and family history.   Reviewed problem list, medications and  allergies. Physical Assessment:   Vitals:   06/25/23 0929  BP: (!) 174/88  Pulse: 83  Weight: 121 lb 12.8 oz (55.2 kg)  Height: 5' (1.524 m)  Body mass index is 23.79 kg/m.        Physical Examination:   General appearance - well appearing, and in no distress  Mental status - alert, oriented to person, place, and time  Psych:  She has a normal mood and affect  Skin - warm and dry, normal color, no suspicious lesions noted  Chest - effort normal, all lung fields clear to auscultation bilaterally  Heart - normal rate and regular rhythm  Neck:  midline trachea, no thyromegaly or nodules  Breasts - breasts appear normal, no suspicious masses, no skin or nipple changes or  axillary nodes  Abdomen - soft, nontender, nondistended, no masses or organomegaly  Pelvic - VULVA: normal appearing vulva with no masses, tenderness or lesions   VAGINA: normal appearing vagina with normal color and discharge, no lesions   CERVIX: surgically absent  Thin prep pap not obtained  UTERUS: surgically absent  ADNEXA: No adnexal masses or tenderness noted.  Rectal - normal rectal, good sphincter tone, no masses felt.  Extremities:  No swelling or varicosities  noted  Chaperone present for exam  Assessment & Plan:  1. Well woman exam with routine gynecological exam (Primary) - Pap smear 2024 - Mammogram 2024 - Colonoscopy 2012 - Bone mineral density scheduled 09/2022 - lab work ordered - vaccines reviewed/updated  2. Low energy  3. Vaginal dryness - NONFORMULARY OR COMPOUNDED ITEM; Vitamin E vaginal cream 200u/ml.  Can apply one ML vaginally three times weekly.  Dispense 3 month supply.  3 RF.  Dispense: 1 each; Refill: 3  4. H/O: hysterectomy  5. B12 deficiency - Vitamin B12  6. Vitamin D deficiency - VITAMIN D 25 Hydroxy (Vit-D Deficiency, Fractures)  7. S/P mitral valve replacement - followed by Dr. Antoine Poche   Orders Placed This Encounter  Procedures   VITAMIN D 25 Hydroxy (Vit-D  Deficiency, Fractures)   Vitamin B12    Meds:  Meds ordered this encounter  Medications   NONFORMULARY OR COMPOUNDED ITEM    Sig: Vitamin E vaginal cream 200u/ml.  Can apply one ML vaginally three times weekly.  Dispense 3 month supply.  3 RF.    Dispense:  1 each    Refill:  3    Follow-up: Return in about 1 year (around 06/24/2024).  Jerene Bears, MD 06/25/2023 10:21 AM

## 2023-06-25 NOTE — Telephone Encounter (Signed)
 Called patient and was unable to reach phone is not in services .

## 2023-06-26 LAB — VITAMIN D 25 HYDROXY (VIT D DEFICIENCY, FRACTURES): Vit D, 25-Hydroxy: 51.2 ng/mL (ref 30.0–100.0)

## 2023-06-26 LAB — VITAMIN B12: Vitamin B-12: 597 pg/mL (ref 232–1245)

## 2023-06-26 NOTE — Progress Notes (Signed)
 Remote pacemaker transmission.

## 2023-06-30 DIAGNOSIS — I351 Nonrheumatic aortic (valve) insufficiency: Secondary | ICD-10-CM | POA: Insufficient documentation

## 2023-06-30 NOTE — Progress Notes (Unsigned)
 Cardiology Office Note:   Date:  07/02/2023  ID:  Elaine Le, DOB [birth year], MRN [id number] PCP: Elaine Ishihara, Elaine Le  [facility] Providers Cardiologist:  Rollene Rotunda, Elaine Le Electrophysiologist:  Lanier Prude, Elaine Le {  History of Present Illness:   Elaine Le is a 63 y.o. female who presents for follow-up after mitral valve replacement. She had probable rheumatic disease with severe regurgitation and moderate stenosis and is status post minimally invasive valve replacement.   TEE in Feb 2023 suggested moderate MR with moderate stenosis.  On TEE mitral valve has severe regurgitation with a 25 mm Edwards Magna bioprosthetic valve.  There was severe MR.  The EF is 65%.   She is now status redo mitral valve replacement.  She had CHB and required pacemaker placement.    She had post op atrial fib.  She had adhesions from her previous surgery.  She had difficult anatomy for adequate exposure.  She did have a 25 mm pericardial Maitra valve placed.  Her left atrial appendage was oversewn.  TEE after coming off bypass demonstrated central regurgitation of the leaflets.  Patient was placed back on bypass.  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  She subsequently had a Mosaic valve placed.  This was stable on follow up echo in Feb 2024.    Since I last saw her she has had no new cardiovascular complaints.  She says that she is just fatigued.  She does describe dyspnea which she says her oxygen saturation stays in the 90s.  She denies any new chest pressure, neck or arm discomfort.  She has had no new palpitations, presyncope or syncope.  She just feels fatigued.  She does say she is not using her CPAP but she does not think that is the problem.  ROS: As stated in the HPI and negative for all other systems.  Studies Reviewed:    EKG:   NA  Risk Assessment/Calculations:          Physical Exam:   VS:  BP (!) 145/80   Pulse 90   Ht 5'  (1.524 m)   Wt 120 lb 12.8 oz (54.8 kg)   SpO2 96%   BMI 23.59 kg/m    Wt Readings from Last 3 Encounters:  07/02/23 120 lb 12.8 oz (54.8 kg)  06/25/23 121 lb 12.8 oz (55.2 kg)  05/25/23 123 lb (55.8 kg)     GEN: Well nourished, well developed in no acute distress NECK: No JVD; No carotid bruits CARDIAC: RRR, 2 out of 6 apical systolic murmur radiating out the aortic outflow tract, 3 out of 6 diastolic murmur heard best at the third left intercostal space murmurs, rubs, gallops RESPIRATORY:  Clear to auscultation without rales, wheezing or rhonchi  ABDOMEN: Soft, non-tender, non-distended EXTREMITIES:  No edema; No deformity   ASSESSMENT AND PLAN:   MV REPLACEMENT:    This was stable on echo last year.  I will be fine in this up as below.  She understands endocarditis prophylaxis.  She has had some mild mitral stenosis.  T  AI: This was moderate on echo in Feb 2024.  I will follow this up with an echo.     DYSLIPIDEMIA:    LDL was 202 in 2023 but she has not wanted to take any medications for this.  We talked about statins.  She had not wanted to change therapies.  I will defer follow-up and to her primary provider.  PPM:  She had a normal device interrogation in Jan.  I reviewed this interrogation for this appt.    ATRIAL FIB:   She gets her device interrogation and there is no evidence of atrial fibrillation.  No change in therapy.\  SLEEP APNEA:    She is going to have this followed by her primary provider and they are apparently to try some different masks.  FATIGUE: I do not see a recent CBC so I will check this.  Certainly her fatigue could be related to her sleep apnea though she does not think so.  I am not otherwise seeing a cardiac etiology.     Follow up with me in 1 year  Signed, Rollene Rotunda, Elaine Le

## 2023-07-02 ENCOUNTER — Encounter: Payer: Self-pay | Admitting: Cardiology

## 2023-07-02 ENCOUNTER — Ambulatory Visit: Payer: Medicare Other | Attending: Cardiology | Admitting: Cardiology

## 2023-07-02 VITALS — BP 145/80 | HR 90 | Ht 60.0 in | Wt 120.8 lb

## 2023-07-02 DIAGNOSIS — I351 Nonrheumatic aortic (valve) insufficiency: Secondary | ICD-10-CM

## 2023-07-02 DIAGNOSIS — Z952 Presence of prosthetic heart valve: Secondary | ICD-10-CM

## 2023-07-02 DIAGNOSIS — E785 Hyperlipidemia, unspecified: Secondary | ICD-10-CM

## 2023-07-02 DIAGNOSIS — G4730 Sleep apnea, unspecified: Secondary | ICD-10-CM | POA: Diagnosis present

## 2023-07-02 NOTE — Patient Instructions (Signed)
 Medication Instructions:  No changes. *If you need a refill on your cardiac medications before your next appointment, please call your pharmacy*   Lab Work: CBC today. If you have labs (blood work) drawn today and your tests are completely normal, you will receive your results only by: MyChart Message (if you have MyChart) OR A paper copy in the mail If you have any lab test that is abnormal or we need to change your treatment, we will call you to review the results.   Testing/Procedures: Your physician has requested that you have an echocardiogram. Echocardiography is a painless test that uses sound waves to create images of your heart. It provides your doctor with information about the size and shape of your heart and how well your heart's chambers and valves are working. This procedure takes approximately one hour. There are no restrictions for this procedure. Please do NOT wear cologne, perfume, aftershave, or lotions (deodorant is allowed). Please arrive 15 minutes prior to your appointment time.  Please note: We ask at that you not bring children with you during ultrasound (echo/ vascular) testing. Due to room size and safety concerns, children are not allowed in the ultrasound rooms during exams. Our front office staff cannot provide observation of children in our lobby area while testing is being conducted. An adult accompanying a patient to their appointment will only be allowed in the ultrasound room at the discretion of the ultrasound technician under special circumstances. We apologize for any inconvenience.    Follow-Up: At [facility], you and your health needs are our priority.  As part of our continuing mission to provide you with exceptional heart care, we have created designated Provider Care Teams.  These Care Teams include your primary Cardiologist (physician) and Advanced Practice Providers (APPs -  Physician Assistants and Nurse Practitioners) who all work  together to provide you with the care you need, when you need it.  We recommend signing up for the patient portal called "MyChart".  Sign up information is provided on this After Visit Summary.  MyChart is used to connect with patients for Virtual Visits (Telemedicine).  Patients are able to view lab/test results, encounter notes, upcoming appointments, etc.  Non-urgent messages can be sent to your provider as well.   To learn more about what you can do with MyChart, go to [email or web address].    Your next appointment:   1 year(s)  Provider:   Rollene Rotunda, MD     Other Instructions Take and record blood pressure on log provided by office nurse.

## 2023-07-03 LAB — CBC
Hematocrit: 40.7 % (ref 34.0–46.6)
Hemoglobin: 14.1 g/dL (ref 11.1–15.9)
MCH: 29.6 pg (ref 26.6–33.0)
MCHC: 34.6 g/dL (ref 31.5–35.7)
MCV: 86 fL (ref 79–97)
Platelets: 295 10*3/uL (ref 150–450)
RBC: 4.76 x10E6/uL (ref 3.77–5.28)
RDW: 13 % (ref 11.7–15.4)
WBC: 7.1 10*3/uL (ref 3.4–10.8)

## 2023-07-10 ENCOUNTER — Encounter: Payer: Self-pay | Admitting: Obstetrics & Gynecology

## 2023-07-10 DIAGNOSIS — Z1231 Encounter for screening mammogram for malignant neoplasm of breast: Secondary | ICD-10-CM

## 2023-07-14 ENCOUNTER — Telehealth (HOSPITAL_BASED_OUTPATIENT_CLINIC_OR_DEPARTMENT_OTHER): Payer: Self-pay | Admitting: *Deleted

## 2023-07-14 DIAGNOSIS — N62 Hypertrophy of breast: Secondary | ICD-10-CM

## 2023-07-14 NOTE — Telephone Encounter (Signed)
 Pt states that she called to schedule her screening mammogram at the breast center but they are requiring a diagnostic mammogram to be ordered. She reported to them that she has had an increase in breast tissue after having many different medical diagnoses and procedures this past year. Diagnostic mammogram ordered.

## 2023-07-15 ENCOUNTER — Other Ambulatory Visit: Payer: Self-pay | Admitting: Obstetrics & Gynecology

## 2023-07-15 ENCOUNTER — Other Ambulatory Visit (HOSPITAL_BASED_OUTPATIENT_CLINIC_OR_DEPARTMENT_OTHER): Payer: Self-pay | Admitting: Obstetrics & Gynecology

## 2023-07-15 DIAGNOSIS — N62 Hypertrophy of breast: Secondary | ICD-10-CM

## 2023-07-24 ENCOUNTER — Telehealth: Payer: Self-pay | Admitting: Internal Medicine

## 2023-07-24 NOTE — Telephone Encounter (Signed)
 Inbound all from patient requesting to speak with a nurse in regards to her having abdominal pain.   Last ov in 2020. Offered patient next available but patient decline.   Please advise. Thank you

## 2023-07-27 NOTE — Telephone Encounter (Signed)
 Spoke to patient who states that she has been having constipation and change in bowels. She has been offered appointment with a physician extender but declines this appointment. She instead wants to see Dr Elaine Le only. She has been scheduled for his next available appointment date 10/13/23.

## 2023-07-29 ENCOUNTER — Other Ambulatory Visit (HOSPITAL_COMMUNITY)

## 2023-07-30 ENCOUNTER — Ambulatory Visit (HOSPITAL_COMMUNITY): Attending: Cardiology

## 2023-07-30 DIAGNOSIS — Z952 Presence of prosthetic heart valve: Secondary | ICD-10-CM | POA: Diagnosis present

## 2023-07-30 LAB — ECHOCARDIOGRAM COMPLETE
AR max vel: 1.35 cm2
AV Area VTI: 1.52 cm2
AV Area mean vel: 1.5 cm2
AV Mean grad: 19 mmHg
AV Peak grad: 40.4 mmHg
Ao pk vel: 3.18 m/s
Area-P 1/2: 3.07 cm2
MV VTI: 2.15 cm2
P 1/2 time: 365 ms
S' Lateral: 3.3 cm

## 2023-08-10 ENCOUNTER — Other Ambulatory Visit (HOSPITAL_COMMUNITY)

## 2023-08-17 ENCOUNTER — Ambulatory Visit (INDEPENDENT_AMBULATORY_CARE_PROVIDER_SITE_OTHER): Payer: Medicare Other

## 2023-08-17 DIAGNOSIS — I442 Atrioventricular block, complete: Secondary | ICD-10-CM | POA: Diagnosis not present

## 2023-08-17 LAB — CUP PACEART REMOTE DEVICE CHECK
Date Time Interrogation Session: 20250421090853
Implantable Lead Connection Status: 753985
Implantable Lead Connection Status: 753985
Implantable Lead Implant Date: 20231023
Implantable Lead Implant Date: 20231023
Implantable Lead Location: 753859
Implantable Lead Location: 753860
Implantable Lead Model: 377
Implantable Lead Model: 377
Implantable Lead Serial Number: 8001105850
Implantable Lead Serial Number: 8001122371
Implantable Pulse Generator Implant Date: 20231023
Pulse Gen Model: 407145
Pulse Gen Serial Number: 1000084874

## 2023-08-26 ENCOUNTER — Ambulatory Visit
Admission: RE | Admit: 2023-08-26 | Discharge: 2023-08-26 | Disposition: A | Discharge status: Home / Self Care | Source: Ambulatory Visit | Attending: Obstetrics & Gynecology | Admitting: Obstetrics & Gynecology

## 2023-08-26 ENCOUNTER — Ambulatory Visit

## 2023-08-26 ENCOUNTER — Encounter

## 2023-08-26 DIAGNOSIS — N62 Hypertrophy of breast: Secondary | ICD-10-CM

## 2023-10-07 NOTE — Progress Notes (Signed)
 Remote pacemaker transmission.

## 2023-10-13 ENCOUNTER — Ambulatory Visit: Admitting: Internal Medicine

## 2023-10-14 ENCOUNTER — Other Ambulatory Visit: Payer: Medicare Other

## 2023-11-16 ENCOUNTER — Ambulatory Visit: Payer: Medicare Other

## 2023-11-16 DIAGNOSIS — I442 Atrioventricular block, complete: Secondary | ICD-10-CM | POA: Diagnosis not present

## 2023-11-17 LAB — CUP PACEART REMOTE DEVICE CHECK
Battery Voltage: 85
Date Time Interrogation Session: 20250721100310
Implantable Lead Connection Status: 753985
Implantable Lead Connection Status: 753985
Implantable Lead Implant Date: 20231023
Implantable Lead Implant Date: 20231023
Implantable Lead Location: 753859
Implantable Lead Location: 753860
Implantable Lead Model: 377
Implantable Lead Model: 377
Implantable Lead Serial Number: 8001105850
Implantable Lead Serial Number: 8001122371
Implantable Pulse Generator Implant Date: 20231023
Pulse Gen Model: 407145
Pulse Gen Serial Number: 1000084874

## 2023-11-19 ENCOUNTER — Ambulatory Visit: Payer: Self-pay | Admitting: Cardiology

## 2023-12-18 ENCOUNTER — Telehealth: Payer: Self-pay | Admitting: Cardiology

## 2023-12-18 NOTE — Telephone Encounter (Signed)
 Patient c/o Palpitations: STAT if patient c/o lightheadedness, shortness of breath, or chest pain  How long have you had palpitations/irregular HR/ Afib? Are you having the symptoms now? A week  Are you currently experiencing lightheadedness, SOB or CP? No  Do you have a history of afib (atrial fibrillation) or irregular heart rhythm? No  Have you checked your BP or HR? (document readings if available): No  Are you experiencing any other symptoms? Nausea

## 2023-12-18 NOTE — Telephone Encounter (Unsigned)
 Called and patient and verbalized this has been going on for a week off and on. Describe symptoms as fluttering sensation in left side of heart, lightheadedness. Denies chest pain.Patient verbalized she does not check blood pressure or heart. Patient verbalized she drinks about half gallon of water  a day.

## 2023-12-29 ENCOUNTER — Other Ambulatory Visit (HOSPITAL_BASED_OUTPATIENT_CLINIC_OR_DEPARTMENT_OTHER)

## 2024-01-02 NOTE — Progress Notes (Unsigned)
 Cardiology Office Note:   Date:  01/05/2024  ID:  Elaine Le, DOB 1961-03-15, MRN [id number] PCP: Elliot Charm, MD  [facility] Providers Cardiologist:  Lynwood Schilling, MD Electrophysiologist:  OLE ONEIDA HOLTS, MD {  History of Present Illness:   Elaine Le is a 63 y.o. female who presents for follow-up after mitral valve replacement. She had probable rheumatic disease with severe regurgitation and moderate stenosis and is status post minimally invasive valve replacement.   TEE in Feb 2023 suggested moderate MR with moderate stenosis.  On TEE mitral valve has severe regurgitation with a 25 mm Edwards Magna bioprosthetic valve.  There was severe MR.  The EF is 65%.   She is now status redo mitral valve replacement.  She had CHB and required pacemaker placement.    She had post op atrial fib.  She had adhesions from her previous surgery.  She had difficult anatomy for adequate exposure.  She did have a 25 mm pericardial Maitra valve placed.  Her left atrial appendage was oversewn.  TEE after coming off bypass demonstrated central regurgitation of the leaflets.  Patient was placed back on bypass.  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  She subsequently had a Mosaic valve placed.  This was stable on follow up echo in Feb 2024.   In April 2025 the EF was said to be 60 to 65%.  The bioprosthetic valve had normal function in the mitral position.  There is moderate aortic stenosis and regurgitation.  She says she is tired but she does not report shortness of breath.  In fact she walked 5 flights up to this appointment.  She does push-ups against the counter and squats.  She says with this she does not have shortness of breath and does not describe PND or orthopnea.  She is not having any palpitations, presyncope or syncope.  She has had no weight gain or edema.  She has had no cough fevers or chills.  ROS: As stated in the HPI and negative  for all other systems.  Studies Reviewed:    EKG:   EKG Interpretation Date/Time:  Tuesday January 05 2024 16:11:01 EDT Ventricular Rate:  83 PR Interval:  166 QRS Duration:  160 QT Interval:  436 QTC Calculation: 512 R Axis:   -83  Text Interpretation: Atrial-sensed ventricular-paced rhythm When compared with ECG of 25-May-2023 10:11, Vent. rate has increased BY   9 BPM Confirmed by Schilling Lynwood ([id number]) on 01/05/2024 4:38:27 PM   Risk Assessment/Calculations:           Physical Exam:   VS:  BP (!) 156/68 (BP Location: Right Arm, Patient Position: Sitting, Cuff Size: Normal)   Pulse 83   Ht 5' (1.524 m)   Wt 122 lb 6.4 oz (55.5 kg)   SpO2 98%   BMI 23.90 kg/m    Wt Readings from Last 3 Encounters:  01/05/24 122 lb 6.4 oz (55.5 kg)  07/02/23 120 lb 12.8 oz (54.8 kg)  06/25/23 121 lb 12.8 oz (55.2 kg)     GEN: Well nourished, well developed in no acute distress NECK: No JVD; No carotid bruits CARDIAC: RRR, 3 out of 6 systolic murmur radiating at the aortic outflow tract, 2 out of 6 diastolic murmur heard best at the third left intercostal space murmurs, rubs, gallops RESPIRATORY:  Clear to auscultation without rales, wheezing or rhonchi  ABDOMEN: Soft, non-tender, non-distended EXTREMITIES:  No edema; No deformity  ASSESSMENT AND PLAN:   MV REPLACEMENT:    She has stable mitral valve replacement.  She understands endocarditis prophylaxis.  No change in therapy.   AI:   This is unchanged from previous.  I will follow this up with an echo this spring and see her after that.   DYSLIPIDEMIA:    LDL was previously 202 which she has not wanted to take any therapies for this.   PPM: She is up-to-date with device function and has 97% atrial sensing with V pacing 100% of the time.    ATRIAL FIB:   She has had no recurrent atrial fibrillation.  SLEEP APNEA:   She uses CPAP and thinks this works well.  FATIGUE: This has been a chronic complaint.  I will again check CBC,  TSH, B12.    Follow up with me after the echo.   Signed, Lynwood Schilling, MD

## 2024-01-05 ENCOUNTER — Ambulatory Visit: Attending: Cardiology | Admitting: Cardiology

## 2024-01-05 ENCOUNTER — Encounter: Payer: Self-pay | Admitting: Cardiology

## 2024-01-05 VITALS — BP 156/68 | HR 83 | Ht 60.0 in | Wt 122.4 lb

## 2024-01-05 DIAGNOSIS — E559 Vitamin D deficiency, unspecified: Secondary | ICD-10-CM | POA: Diagnosis not present

## 2024-01-05 DIAGNOSIS — E785 Hyperlipidemia, unspecified: Secondary | ICD-10-CM | POA: Diagnosis not present

## 2024-01-05 DIAGNOSIS — Z952 Presence of prosthetic heart valve: Secondary | ICD-10-CM | POA: Diagnosis not present

## 2024-01-05 DIAGNOSIS — I351 Nonrheumatic aortic (valve) insufficiency: Secondary | ICD-10-CM | POA: Insufficient documentation

## 2024-01-05 NOTE — Patient Instructions (Signed)
 Medication Instructions:  Your physician recommends that you continue on your current medications as directed. Please refer to the Current Medication list given to you today.  *If you need a refill on your cardiac medications before your next appointment, please call your pharmacy*  Lab Work: Vitamin D , B12, CBC, TSH today at [facility] If you have labs (blood work) drawn today and your tests are completely normal, you will receive your results only by: MyChart Message (if you have MyChart) OR A paper copy in the mail If you have any lab test that is abnormal or we need to change your treatment, we will call you to review the results.  Testing/Procedures: Echocardiogram in April 2026 Your physician has requested that you have an echocardiogram. Echocardiography is a painless test that uses sound waves to create images of your heart. It provides your doctor with information about the size and shape of your heart and how well your heart's chambers and valves are working. This procedure takes approximately one hour. There are no restrictions for this procedure. Please do NOT wear cologne, perfume, aftershave, or lotions (deodorant is allowed). Please arrive 15 minutes prior to your appointment time.  Please note: We ask at that you not bring children with you during ultrasound (echo/ vascular) testing. Due to room size and safety concerns, children are not allowed in the ultrasound rooms during exams. Our front office staff cannot provide observation of children in our lobby area while testing is being conducted. An adult accompanying a patient to their appointment will only be allowed in the ultrasound room at the discretion of the ultrasound technician under special circumstances. We apologize for any inconvenience.   Follow-Up: At [facility], you and your health needs are our priority.  As part of our continuing mission to provide you with exceptional heart care, our providers are all  part of one team.  This team includes your primary Cardiologist (physician) and Advanced Practice Providers or APPs (Physician Assistants and Nurse Practitioners) who all work together to provide you with the care you need, when you need it.  Your next appointment:   April 2026 after echo  Provider:   [place], MD  We recommend signing up for the patient portal called MyChart.  Sign up information is provided on this After Visit Summary.  MyChart is used to connect with patients for Virtual Visits (Telemedicine).  Patients are able to view lab/test results, encounter notes, upcoming appointments, etc.  Non-urgent messages can be sent to your provider as well.   To learn more about what you can do with MyChart, go to [email or web address].

## 2024-01-08 LAB — CBC
Hematocrit: 39 % (ref 34.0–46.6)
Hemoglobin: 13.3 g/dL (ref 11.1–15.9)
MCH: 30 pg (ref 26.6–33.0)
MCHC: 34.1 g/dL (ref 31.5–35.7)
MCV: 88 fL (ref 79–97)
Platelets: 261 x10E3/uL (ref 150–450)
RBC: 4.43 x10E6/uL (ref 3.77–5.28)
RDW: 13.4 % (ref 11.7–15.4)
WBC: 9.5 x10E3/uL (ref 3.4–10.8)

## 2024-01-08 LAB — TSH: TSH: 0.336 u[IU]/mL — ABNORMAL LOW (ref 0.450–4.500)

## 2024-01-08 LAB — VITAMIN D 25 HYDROXY (VIT D DEFICIENCY, FRACTURES): Vit D, 25-Hydroxy: 58.4 ng/mL (ref 30.0–100.0)

## 2024-01-10 ENCOUNTER — Ambulatory Visit: Payer: Self-pay | Admitting: Cardiology

## 2024-01-11 NOTE — Telephone Encounter (Signed)
 Spoke with pt regarding her results. Pt would like to speak with her PCP before doing any more labs. Pt will call back when and if she would like to draw a T3 and T4. Pt asked for results to be sent via mail. Results sent. Pt verbalized understanding. All questions if any were answered. Results sent to PCP.

## 2024-01-26 ENCOUNTER — Telehealth: Payer: Self-pay | Admitting: Cardiology

## 2024-01-26 NOTE — Telephone Encounter (Signed)
 Pt is requesting a paper copy of her Test results that was done a few weeks ago. She would like the copy to to be mailed. Please advise

## 2024-01-26 NOTE — Telephone Encounter (Signed)
 I spoke with patient and let her know recent lab results had been mailed to her.  She has not received yet.  Will send another copy to her.  Address in chart confirmed with patient.

## 2024-02-01 NOTE — Progress Notes (Signed)
 Remote PPM Transmission

## 2024-02-15 ENCOUNTER — Ambulatory Visit (INDEPENDENT_AMBULATORY_CARE_PROVIDER_SITE_OTHER): Payer: Medicare Other

## 2024-02-15 DIAGNOSIS — I442 Atrioventricular block, complete: Secondary | ICD-10-CM

## 2024-02-17 LAB — CUP PACEART REMOTE DEVICE CHECK
Battery Voltage: 85
Date Time Interrogation Session: 20251020101928
Implantable Lead Connection Status: 753985
Implantable Lead Connection Status: 753985
Implantable Lead Implant Date: 20231023
Implantable Lead Implant Date: 20231023
Implantable Lead Location: 753859
Implantable Lead Location: 753860
Implantable Lead Model: 377
Implantable Lead Model: 377
Implantable Lead Serial Number: 8001105850
Implantable Lead Serial Number: 8001122371
Implantable Pulse Generator Implant Date: 20231023
Pulse Gen Model: 407145
Pulse Gen Serial Number: 1000084874

## 2024-02-19 NOTE — Progress Notes (Signed)
 Remote PPM Transmission

## 2024-02-22 ENCOUNTER — Ambulatory Visit: Payer: Self-pay | Admitting: Cardiology

## 2024-05-16 ENCOUNTER — Ambulatory Visit: Payer: Medicare Other

## 2024-05-16 DIAGNOSIS — I442 Atrioventricular block, complete: Secondary | ICD-10-CM

## 2024-05-17 LAB — CUP PACEART REMOTE DEVICE CHECK
Date Time Interrogation Session: 20260119084737
Implantable Lead Connection Status: 753985
Implantable Lead Connection Status: 753985
Implantable Lead Implant Date: 20231023
Implantable Lead Implant Date: 20231023
Implantable Lead Location: 753859
Implantable Lead Location: 753860
Implantable Lead Model: 377
Implantable Lead Model: 377
Implantable Lead Serial Number: 8001105850
Implantable Lead Serial Number: 8001122371
Implantable Pulse Generator Implant Date: 20231023
Pulse Gen Model: 407145
Pulse Gen Serial Number: 1000084874

## 2024-05-19 ENCOUNTER — Encounter: Payer: Self-pay | Admitting: Internal Medicine

## 2024-05-19 ENCOUNTER — Ambulatory Visit: Payer: Self-pay | Admitting: Cardiology

## 2024-05-19 ENCOUNTER — Ambulatory Visit: Admitting: Internal Medicine

## 2024-05-19 VITALS — BP 148/60 | HR 86 | Ht 60.0 in | Wt 119.4 lb

## 2024-05-19 DIAGNOSIS — K581 Irritable bowel syndrome with constipation: Secondary | ICD-10-CM | POA: Diagnosis not present

## 2024-05-19 DIAGNOSIS — Z860100 Personal history of colon polyps, unspecified: Secondary | ICD-10-CM

## 2024-05-19 DIAGNOSIS — K219 Gastro-esophageal reflux disease without esophagitis: Secondary | ICD-10-CM | POA: Diagnosis not present

## 2024-05-19 DIAGNOSIS — Z860101 Personal history of adenomatous and serrated colon polyps: Secondary | ICD-10-CM | POA: Diagnosis not present

## 2024-05-19 DIAGNOSIS — K449 Diaphragmatic hernia without obstruction or gangrene: Secondary | ICD-10-CM | POA: Diagnosis not present

## 2024-05-19 DIAGNOSIS — R1013 Epigastric pain: Secondary | ICD-10-CM

## 2024-05-19 DIAGNOSIS — R140 Abdominal distension (gaseous): Secondary | ICD-10-CM

## 2024-05-19 MED ORDER — NA SULFATE-K SULFATE-MG SULF 17.5-3.13-1.6 GM/177ML PO SOLN: 1.0000 | Freq: Once | ORAL | 0 refills | Status: AC

## 2024-05-19 NOTE — Progress Notes (Signed)
" ° °  Subjective:    Patient ID: Elaine Le, female    DOB: Jun 30, 1960, 64 y.o.   MRN: [id number]  HPI  Elaine Le is a 64 year old female with IBS-C, adenomatous colon polyps, and GERD who presents for follow-up of gastrointestinal symptoms.  She was last seen in February 2020.  Constipation and bloating have been longstanding, with intermittent improvement. Over the past month, daily bowel movements occurred after increasing dietary fiber with kale and navy beans, resulting in symptom improvement. In the week prior to the visit, constipation recurred, with only two small bowel movements despite continued dietary efforts. Persistent bloating, upper abdominal fullness, and occasional epigastric pain are present, described as pressure and tightness in the upper abdomen and intermittent sensitivity in the left upper quadrant. Stomach feels full even when hungry, and upper epigastric pain occurs intermittently. Less blood in stool was noted during periods of improved bowel movements.  Weight decreased to 115 pounds around Christmas, but has since increased to 118-119 pounds. The patient believes part of the weight gain is fluid. Diet is lean, and only two medications are taken, with occasional use of Claritin.  Reflux symptoms are currently less frequent but increase slightly with abdominal fullness. EGD and colonoscopy in October 2012 revealed a hiatal hernia, mild to moderate H. pylori negative antral gastritis, and a 4 mm tubular adenoma in the sigmoid colon, which was removed. Uncertainty exists regarding follow-up colonoscopy after 2012.  Additional medical history includes prior mitral valve replacement, COPD, sacroiliitis, and hypothyroidism.   Review of Systems As per HPI, otherwise negative  Current Medications, Allergies, Past Medical History, Past Surgical History, Family History and Social History were reviewed in [employer] record.     Objective:   Physical Exam BP (!) 148/60   Pulse 86   Ht 5' (1.524 m)   Wt 119 lb 6 oz (54.1 kg)   BMI 23.31 kg/m  Gen: awake, alert, NAD HEENT: anicteric  Abd: soft, NT/ND, +BS throughout Ext: no c/c/e Neuro: nonfocal   Diagnostic EGD (01/2011): Hiatal hernia, mild to moderate antral gastritis, H. pylori negative Colonoscopy (01/2011): Fair preparation to cecum, 4 mm tubular adenoma in sigmoid colon removed, mild diverticulosis     Assessment & Plan:   Irritable bowel syndrome with constipation Chronic condition with recurrent constipation and bloating, no acute features. - Discussed dietary management and recent improvements with increased fiber intake. - Provided anticipatory guidance that bowel preparation for colonoscopy may temporarily improve symptoms. - [facility] would be a good option if symptoms persist post colonoscopy  History of adenomatous colon polyps Remote tubular adenoma with overdue surveillance, requires evaluation for new or recurrent polyps. -Colonoscopy in the [facility] - Reviewed bowel preparation and sedation requirements for the procedure.  The nature of the procedure, as well as the risks, benefits, and alternatives were carefully and thoroughly reviewed with the patient. Ample time for discussion and questions allowed. The patient understood, was satisfied, and agreed to proceed.   Hiatal hernia with gastroesophageal reflux disease Hiatal hernia and GERD with mild reflux symptoms, no acute complications. - Ordered upper endoscopy to evaluate current status of hiatal hernia and gastritis. - Discussed that endoscopic findings may guide future management, including consideration of surgical or medical interventions if warranted.   "

## 2024-05-19 NOTE — Patient Instructions (Signed)
 You have been scheduled for an endoscopy and colonoscopy. Please follow the written instructions given to you at your visit today.  If you use inhalers (even only as needed), please bring them with you on the day of your procedure.  DO NOT TAKE 7 DAYS PRIOR TO TEST- Trulicity (dulaglutide) Ozempic, Wegovy (semaglutide) Mounjaro, Zepbound (tirzepatide) Bydureon Bcise (exanatide extended release)  DO NOT TAKE 1 DAY PRIOR TO YOUR TEST Rybelsus (semaglutide) Adlyxin (lixisenatide) Victoza (liraglutide) Byetta (exanatide) _______________________________________________________________  _______________________________________________________  If your blood pressure at your visit was 140/90 or greater, please contact your primary care physician to follow up on this.  _______________________________________________________  If you are age 81 or older, your body mass index should be between 23-30. Your Body mass index is 23.31 kg/m. If this is out of the aforementioned range listed, please consider follow up with your Primary Care Provider.  If you are age 25 or younger, your body mass index should be between 19-25. Your Body mass index is 23.31 kg/m. If this is out of the aformentioned range listed, please consider follow up with your Primary Care Provider.   ________________________________________________________  The [facility] providers would like to encourage you to use MYCHART to communicate with providers for non-urgent requests or questions.  Due to long hold times on the telephone, sending your provider a message by [facility] may be a faster and more efficient way to get a response.  Please allow 48 business hours for a response.  Please remember that this is for non-urgent requests.  _______________________________________________________  [facility] is using a team-based approach to care.  Your team is made up of your doctor and two to three APPS. Our APPS (Nurse  Practitioners and Physician Assistants) work with your physician to ensure care continuity for you. They are fully qualified to address your health concerns and develop a treatment plan. They communicate directly with your gastroenterologist to care for you. Seeing the Advanced Practice Practitioners on your physician's team can help you by facilitating care more promptly, often allowing for earlier appointments, access to diagnostic testing, procedures, and other specialty referrals.

## 2024-05-20 NOTE — Progress Notes (Signed)
 Remote PPM Transmission

## 2024-06-28 ENCOUNTER — Ambulatory Visit (HOSPITAL_BASED_OUTPATIENT_CLINIC_OR_DEPARTMENT_OTHER): Payer: Medicare Other | Admitting: Obstetrics & Gynecology

## 2024-07-11 ENCOUNTER — Encounter: Admitting: Internal Medicine

## 2024-07-22 ENCOUNTER — Ambulatory Visit (HOSPITAL_BASED_OUTPATIENT_CLINIC_OR_DEPARTMENT_OTHER): Admitting: Obstetrics & Gynecology

## 2024-08-15 ENCOUNTER — Ambulatory Visit: Payer: Medicare Other

## 2024-11-14 ENCOUNTER — Ambulatory Visit: Payer: Medicare Other
